# Patient Record
Sex: Female | Born: 1950
Health system: Southern US, Community
[De-identification: ages and names within clinical notes are randomized; demographics above are authoritative.]

## PROBLEM LIST (undated history)

## (undated) DIAGNOSIS — R519 Headache, unspecified: Secondary | ICD-10-CM

## (undated) DIAGNOSIS — M5136 Other intervertebral disc degeneration, lumbar region: Secondary | ICD-10-CM

## (undated) DIAGNOSIS — Z5189 Encounter for other specified aftercare: Secondary | ICD-10-CM

## (undated) DIAGNOSIS — F32A Depression, unspecified: Secondary | ICD-10-CM

## (undated) DIAGNOSIS — K219 Gastro-esophageal reflux disease without esophagitis: Secondary | ICD-10-CM

## (undated) DIAGNOSIS — N184 Chronic kidney disease, stage 4 (severe): Secondary | ICD-10-CM

## (undated) DIAGNOSIS — G4700 Insomnia, unspecified: Secondary | ICD-10-CM

## (undated) DIAGNOSIS — J302 Other seasonal allergic rhinitis: Secondary | ICD-10-CM

## (undated) DIAGNOSIS — J9601 Acute respiratory failure with hypoxia: Secondary | ICD-10-CM

## (undated) DIAGNOSIS — M1990 Unspecified osteoarthritis, unspecified site: Secondary | ICD-10-CM

## (undated) DIAGNOSIS — G8929 Other chronic pain: Secondary | ICD-10-CM

## (undated) DIAGNOSIS — J8410 Pulmonary fibrosis, unspecified: Secondary | ICD-10-CM

## (undated) DIAGNOSIS — G43909 Migraine, unspecified, not intractable, without status migrainosus: Secondary | ICD-10-CM

## (undated) DIAGNOSIS — G2581 Restless legs syndrome: Secondary | ICD-10-CM

## (undated) DIAGNOSIS — M545 Low back pain, unspecified: Secondary | ICD-10-CM

## (undated) DIAGNOSIS — T8859XA Other complications of anesthesia, initial encounter: Secondary | ICD-10-CM

## (undated) DIAGNOSIS — D509 Iron deficiency anemia, unspecified: Secondary | ICD-10-CM

## (undated) DIAGNOSIS — G479 Sleep disorder, unspecified: Secondary | ICD-10-CM

## (undated) DIAGNOSIS — M5459 Other low back pain: Secondary | ICD-10-CM

## (undated) DIAGNOSIS — R7989 Other specified abnormal findings of blood chemistry: Secondary | ICD-10-CM

## (undated) DIAGNOSIS — D649 Anemia, unspecified: Secondary | ICD-10-CM

## (undated) DIAGNOSIS — I700 Atherosclerosis of aorta: Secondary | ICD-10-CM

## (undated) DIAGNOSIS — Q6589 Other specified congenital deformities of hip: Secondary | ICD-10-CM

## (undated) DIAGNOSIS — K449 Diaphragmatic hernia without obstruction or gangrene: Secondary | ICD-10-CM

## (undated) DIAGNOSIS — I209 Angina pectoris, unspecified: Secondary | ICD-10-CM

## (undated) DIAGNOSIS — S329XXA Fracture of unspecified parts of lumbosacral spine and pelvis, initial encounter for closed fracture: Secondary | ICD-10-CM

## (undated) DIAGNOSIS — F419 Anxiety disorder, unspecified: Secondary | ICD-10-CM

## (undated) DIAGNOSIS — S22000A Wedge compression fracture of unspecified thoracic vertebra, initial encounter for closed fracture: Secondary | ICD-10-CM

## (undated) DIAGNOSIS — E538 Deficiency of other specified B group vitamins: Secondary | ICD-10-CM

## (undated) DIAGNOSIS — D631 Anemia in chronic kidney disease: Secondary | ICD-10-CM

## (undated) DIAGNOSIS — R0600 Dyspnea, unspecified: Secondary | ICD-10-CM

## (undated) DIAGNOSIS — M810 Age-related osteoporosis without current pathological fracture: Secondary | ICD-10-CM

## (undated) DIAGNOSIS — I2510 Atherosclerotic heart disease of native coronary artery without angina pectoris: Secondary | ICD-10-CM

## (undated) DIAGNOSIS — R200 Anesthesia of skin: Secondary | ICD-10-CM

## (undated) DIAGNOSIS — M51369 Other intervertebral disc degeneration, lumbar region without mention of lumbar back pain or lower extremity pain: Secondary | ICD-10-CM

## (undated) DIAGNOSIS — E669 Obesity, unspecified: Secondary | ICD-10-CM

## (undated) DIAGNOSIS — E785 Hyperlipidemia, unspecified: Secondary | ICD-10-CM

## (undated) DIAGNOSIS — I011 Acute rheumatic endocarditis: Secondary | ICD-10-CM

## (undated) DIAGNOSIS — I7300 Raynaud's syndrome without gangrene: Secondary | ICD-10-CM

## (undated) DIAGNOSIS — M940 Chondrocostal junction syndrome [Tietze]: Secondary | ICD-10-CM

## (undated) DIAGNOSIS — R202 Paresthesia of skin: Secondary | ICD-10-CM

## (undated) DIAGNOSIS — M069 Rheumatoid arthritis, unspecified: Secondary | ICD-10-CM

## (undated) DIAGNOSIS — J189 Pneumonia, unspecified organism: Secondary | ICD-10-CM

## (undated) DIAGNOSIS — Z96659 Presence of unspecified artificial knee joint: Secondary | ICD-10-CM

## (undated) DIAGNOSIS — N189 Chronic kidney disease, unspecified: Secondary | ICD-10-CM

## (undated) DIAGNOSIS — G4733 Obstructive sleep apnea (adult) (pediatric): Secondary | ICD-10-CM

## (undated) DIAGNOSIS — I509 Heart failure, unspecified: Secondary | ICD-10-CM

## (undated) DIAGNOSIS — M1711 Unilateral primary osteoarthritis, right knee: Secondary | ICD-10-CM

## (undated) DIAGNOSIS — E611 Iron deficiency: Secondary | ICD-10-CM

## (undated) DIAGNOSIS — G4730 Sleep apnea, unspecified: Secondary | ICD-10-CM

## (undated) DIAGNOSIS — I10 Essential (primary) hypertension: Secondary | ICD-10-CM

## (undated) DIAGNOSIS — Z7960 Long term (current) use of unspecified immunomodulators and immunosuppressants: Secondary | ICD-10-CM

## (undated) HISTORY — DX: Unilateral primary osteoarthritis, right knee: M17.11

## (undated) HISTORY — PX: HIP SURGERY: SHX245

## (undated) HISTORY — PX: CHOLECYSTECTOMY: SHX55

## (undated) HISTORY — DX: Heart failure, unspecified: I50.9

## (undated) HISTORY — PX: FRACTURE SURGERY: SHX138

## (undated) HISTORY — DX: Long term (current) use of unspecified immunomodulators and immunosuppressants: Z79.60

## (undated) HISTORY — DX: Acute rheumatic endocarditis: I01.1

## (undated) HISTORY — PX: CARPAL TUNNEL RELEASE: SHX101

## (undated) HISTORY — PX: DILATION AND CURETTAGE OF UTERUS: SHX78

## (undated) HISTORY — DX: Anemia, unspecified: D64.9

## (undated) HISTORY — PX: TUBAL LIGATION: SHX77

## (undated) HISTORY — PX: APPENDECTOMY: SHX54

## (undated) HISTORY — DX: Sleep apnea, unspecified: G47.30

## (undated) HISTORY — DX: Presence of unspecified artificial knee joint: Z96.659

## (undated) HISTORY — PX: ABDOMINAL SURGERY: SHX537

## (undated) NOTE — *Deleted (*Deleted)
[facility]  [address] [place], [place] [address] Phone: [phone]  Fax: [phone]   Clinic Day:  10/23/2020  Referring physician: [name], *  Chief Complaint: Tonya Cummings is a 47 y.o. female with rheumatoid arthritis and iron deficiency anemia who is seen for 3 week assessment.  HPI: The patient was last seen in the hematology clinic on 10/02/2020. At that time, she felt "fine".  She denied any bleeding. Hematocrit was 31.8, hemoglobin 10.1, MCV 96.4, platelets 297,000, WBC 7,700. Ferritin was 70. Sed rate was 47. She continued oral B12 500 mcg.  She received Venofer.  The patient saw Stephens November, NP on 10/06/2020. She was doing well.  She was taking omeprazole.  She denied any abdominal pain, dyspepsia, nausea or vomiting.  Recommendation was VCE.  The patient wished to postpone until after her knee surgery.  She underwent computer assisted total right knee replacement on 10/13/2020 by Dr. Marry Guan.  Estimated blood loss was 50 ml.  During the interim, ***   Past Medical History:  Diagnosis Date  . Anxiety   . Chronic kidney disease   . Chronic pain   . Chronic radicular pain of lower back   . Costochondritis   . Depression   . Encounter for blood transfusion   . GERD (gastroesophageal reflux disease)   . Headache    migraines  . Hip dysplasia   . Hyperlipidemia   . Hypertension   . Iron deficiency anemia   . Low back pain   . Low vitamin B12 level   . Obesity   . Osteoporosis   . Pelvic fracture ([place])   . Raynaud's disease without gangrene   . Restless leg syndrome   . Rheumatoid aortitis   . Rheumatoid arthritis (HCC)    polyarthritis  . Rheumatoid arthritis ([place])   . Seasonal allergies   . Sleep disorder   . Thoracic compression fracture [facility])     Past Surgical History:  Procedure Laterality Date  . ABDOMINAL SURGERY     pt denies  . APPENDECTOMY    . CARPAL TUNNEL RELEASE Bilateral    . CHOLECYSTECTOMY    . COLONOSCOPY WITH PROPOFOL N/A 08/15/2020   Procedure: COLONOSCOPY WITH PROPOFOL;  Surgeon: Robert Bellow, MD;  Location: [facility] ENDOSCOPY;  Service: Endoscopy;  Laterality: N/A;  . DILATION AND CURETTAGE OF UTERUS    . ESOPHAGOGASTRODUODENOSCOPY (EGD) WITH PROPOFOL N/A 08/15/2020   Procedure: ESOPHAGOGASTRODUODENOSCOPY (EGD) WITH PROPOFOL;  Surgeon: Robert Bellow, MD;  Location: [facility] ENDOSCOPY;  Service: Endoscopy;  Laterality: N/A;  . FRACTURE SURGERY     hip fracture   . FRACTURE SURGERY     pelvic fracture plate   . HIP SURGERY Left   . KNEE ARTHROPLASTY Right 10/13/2020   Procedure: COMPUTER ASSISTED TOTAL KNEE ARTHROPLASTY - RNFA;  Surgeon: Dereck Leep, MD;  Location: [facility] ORS;  Service: Orthopedics;  Laterality: Right;  . TUBAL LIGATION      Family History  Problem Relation Age of Onset  . Diabetes Mother   . Hypertension Mother   . Aneurysm Father   . Diabetes Son   . Seizures Son   . Osteosarcoma Brother   . Cancer Brother   . Diabetes Brother   . Diabetes Maternal Grandfather   . Heart disease Maternal Grandfather     Social History:  reports that she has never smoked. She has never used smokeless tobacco. She reports that she does not drink alcohol and  does not use drugs. She has no known exposure to chemicals or radiation.Her husband's name is Herbie Baltimore. They have 3 children. She has been disabled since 2015. She previously worked in [employer]. She lives in [place] her husband, son, and eldest daughter. The patient is alone*** today.   Allergies:  Allergies  Allergen Reactions  . Gabapentin Other (See Comments)    Weight gain  . Ibuprofen Other (See Comments)    Headache    Current Medications: Current Outpatient Medications  Medication Sig Dispense Refill  . Acetaminophen-Caffeine (EXCEDRIN TENSION HEADACHE) 500-65 MG TABS Take 1 tablet by mouth daily as needed (Headache).    . Adalimumab (HUMIRA) 40 MG/0.4ML PSKT  Inject 40 mg into the skin every 14 (fourteen) days.     . ASCORBIC ACID PO Take 1 tablet by mouth daily.     . Calcium Carbonate-Vitamin D 600-400 MG-UNIT chew tablet Chew 1 tablet by mouth 2 (two) times daily.     . celecoxib (CELEBREX) 200 MG capsule Take 1 capsule (200 mg total) by mouth 2 (two) times daily. 60 capsule 1  . Cholecalciferol 50 MCG (2000 UT) CAPS Take 2,000 Units by mouth daily.    . cyclobenzaprine (FLEXERIL) 10 MG tablet Take 10 mg by mouth at bedtime.    . enalapril-hydrochlorothiazide (VASERETIC) 10-25 MG per tablet Take 1 tablet by mouth daily.    Marland Kitchen enoxaparin (LOVENOX) 40 MG/0.4ML injection Inject 0.4 mLs (40 mg total) into the skin daily for 14 days. 5.6 mL 0  . FLUoxetine (PROZAC) 40 MG capsule Take 40 mg by mouth daily.    . folic acid (FOLVITE) 1 MG tablet Take 1 mg by mouth daily.    . hydroxychloroquine (PLAQUENIL) 200 MG tablet Take 200 mg by mouth 2 (two) times daily.    . methotrexate (50 MG/ML) 1 g injection Inject 25 mg into the vein once a week. .6 ml    . Multiple Vitamins-Minerals (MULTIVITAMIN ADULT PO) Take 1 tablet by mouth daily.     Marland Kitchen omeprazole (PRILOSEC) 40 MG capsule Take 40 mg by mouth 2 (two) times daily.     Marland Kitchen oxyCODONE (OXY IR/ROXICODONE) 5 MG immediate release tablet Take 1-2 tablets (5-10 mg total) by mouth every 4 (four) hours as needed for moderate pain (pain score 4-6). 30 tablet 0  . pramipexole (MIRAPEX) 0.125 MG tablet Take 0.125 mg by mouth daily. Take 0.125 mg in the morning and 2 at bedtime    . rosuvastatin (CRESTOR) 5 MG tablet Take 5 mg by mouth every other day.     . traMADol (ULTRAM) 50 MG tablet Take 1-2 tablets (50-100 mg total) by mouth every 4 (four) hours as needed for moderate pain. 30 tablet 0  . traZODone (DESYREL) 50 MG tablet Take 50 mg by mouth at bedtime.      No current facility-administered medications for this visit.   Review of Systems  Constitutional: Negative for chills, diaphoresis, fever, malaise/fatigue and  weight loss (up 1 lb).       Feels "good."  HENT: Negative.  Negative for congestion, ear discharge, ear pain, hearing loss, nosebleeds, sinus pain, sore throat and tinnitus.   Eyes: Positive for photophobia. Negative for blurred vision and double vision.  Respiratory: Positive for shortness of breath (with exertion). Negative for cough, hemoptysis and sputum production.   Cardiovascular: Negative.  Negative for chest pain, palpitations and orthopnea.  Gastrointestinal: Negative for abdominal pain, blood in stool, constipation, diarrhea, heartburn (on omeprazole), melena, nausea and vomiting.  Does not eat iron-rich foods.  Genitourinary: Negative.  Negative for dysuria, flank pain, frequency, hematuria and urgency.  Musculoskeletal: Positive for joint pain (right knee, torn miniscus). Negative for back pain, falls, myalgias and neck pain.  Skin: Negative.  Negative for itching and rash.  Neurological: Positive for headaches. Negative for dizziness, tingling, tremors, sensory change, speech change, focal weakness, seizures and weakness.  Endo/Heme/Allergies: Negative.  Does not bruise/bleed easily.  Psychiatric/Behavioral: Negative for depression and memory loss. The patient has insomnia (wakes up 2-3 times). The patient is not nervous/anxious.   All other systems reviewed and are negative.  Performance status (ECOG):  1***  Vitals There were no vitals taken for this visit.  Physical Exam Vitals and nursing note reviewed.  Constitutional:      General: She is not in acute distress.    Appearance: She is well-developed. She is not diaphoretic.     Interventions: Face mask in place.     Comments: Patient required assistance onto exam table. She has a rolling walker by her side.  HENT:     Head: Normocephalic and atraumatic.     Comments: Long brown hair.    Mouth/Throat:     Mouth: Mucous membranes are moist.     Pharynx: Oropharynx is clear. No oropharyngeal exudate.  Eyes:      General: No scleral icterus.    Extraocular Movements: Extraocular movements intact.     Conjunctiva/sclera: Conjunctivae normal.     Pupils: Pupils are equal, round, and reactive to light.     Comments: Glasses.  Blue eyes.  Neck:     Vascular: No JVD.  Cardiovascular:     Rate and Rhythm: Normal rate and regular rhythm.     Heart sounds: Normal heart sounds. No murmur heard.  No gallop.   Pulmonary:     Effort: Pulmonary effort is normal. No respiratory distress.     Breath sounds: Normal breath sounds. No wheezing or rales.  Chest:     Chest wall: No tenderness.  Abdominal:     General: Bowel sounds are normal. There is no distension.     Palpations: Abdomen is soft. There is no mass.     Tenderness: There is no abdominal tenderness. There is no guarding or rebound.  Musculoskeletal:        General: No tenderness. Normal range of motion.     Cervical back: Normal range of motion and neck supple.  Lymphadenopathy:     Head:     Right side of head: No preauricular, posterior auricular or occipital adenopathy.     Left side of head: No preauricular, posterior auricular or occipital adenopathy.     Cervical: No cervical adenopathy.     Upper Body:     Right upper body: No supraclavicular adenopathy.     Left upper body: No supraclavicular adenopathy.     Lower Body: No right inguinal adenopathy. No left inguinal adenopathy.  Skin:    General: Skin is warm and dry.     Coloration: Skin is not pale.     Findings: No erythema or rash.  Neurological:     Mental Status: She is alert and oriented to person, place, and time.  Psychiatric:        Behavior: Behavior normal.        Thought Content: Thought content normal.    No visits with results within 3 Day(s) from this visit.  Latest known visit with results is:  Admission on 10/13/2020, Discharged on  10/15/2020  Component Date Value Ref Range Status  . ABO/RH(D) 10/13/2020    Final                   Value:O POS Performed at  [facility], [address] [place],  [address]     Assessment:  Tonya Cummings is a 34 y.o. female withseropositive rheumatoid arthritisand a progressive normocytic anemia. She was initially diagnosed in 2000. She is currently on methotrexate SQ oncea week, Plaquenil 200 mg twice daily and Humira 40 mgeveryother week (since 05/2016). She previously received Morrie Sheldon (2014/2015-02/2016) and Enbrel 25 mg biweekly. Shetakes folic acid1 mg a day.   Work-up on 09/07/2019 revealed a hematocrit of 26.2, hemoglobin 8.0, MCV 82.4, platelets 351,000, white count 7900 with an ANC of 5000.  Ferritin was 10 with an iron saturation of 6% and a TIBC of 428.  CRP and sed rate were levated.  B12 was 339 (low normal).  Normal studies included:  folate (> 100), TSH, and haptoglobin.  LDH was 209.  Retic was 1.9%.  She has iron deficiency.  She received Venofer weekly x 3 (10/03/2019 - 10/16/2019), x 2 (05/02/2020 - 05/07/2020), and 07/01/2020.  Ferritin has been followed:  10 on 09/07/2019, 16 on 09/26/2019, 19 on 10/03/2019, 191 on 10/23/2019, 40 on 12/24/2019, and 14 on 02/26/2020.  Colonoscopy on 08/15/2020 revealed one 5 mm polyp in the proximal ascending colon (tubular adenoma). There was diverticulosis in the sigmoid colon. EGD was normal.  She has B12 deficiency.  B12 was 339 on 09/07/2019.  She is on oral B12 500 mcg a day.  B12 was 604 on 10/23/2019.  Folate was > 100 on 09/07/2019.  She has stage III chronic kidney disease.Creatinine is 1.3 (CrCl 41 ml/min).  She underwent computer assisted total right knee replacement on 10/13/2020  Symptomatically, ***   Plan: 1.   Labs today: CBC, ferritin, iron studies, sed rate, B12, folate   2. Iron deficiency anemia           Symptomatically, she feels a fatigue associated with anemia.    Hematocrit 30.7.  Hemoglobin 9.5.  MCV 89.8 on 02/26/2020.              Ferritin 14 (low) with an iron saturation of 4% and  TIBC of 378.  Hematocrit 32.1. hemoglobin 10.1.  MCV 93.9 on 06/30/2020.   Ferritin 104 with an iron saturation of 16% and a TIBC of 270.  Hematocrit 31.8.  Hemoglobin 10.1.  MCV 96.4 on 10/01/2020.   Ferritin 70.  Sed rate 47.  Ferritin goal 100.  Patient last received IV iron on 05/07/2020.  She denies any bleeding.  Review interval colonoscopy and EGD from 08/15/2020.    No evidence of bleeding.  Discuss Venofer for today x1 secondary to upcoming surgery and likely blood loss. 3.   B12 deficiency             B12 was 339 on 09/07/2019 and 604 on 10/23/2019.             B12 goal is 400.             She remains on B12 500 mcg a day.             Check B12 and folate annually. 4.   Venofer today. 5.   RTC on 11/15 for MD assessment, labs (CBC, ferritin, iron studies, sed rate, B12, folate), and +/- Venofer.  I discussed the assessment and treatment plan  with the patient.  The patient was provided an opportunity to ask questions and all were answered.  The patient agreed with the plan and demonstrated an understanding of the instructions.  The patient was advised to call back if the symptoms worsen or if the condition fails to improve as anticipated.  I provided *** minutes of face-to-face time during this this encounter and > 50% was spent counseling as documented under my assessment and plan.  Lequita Asal, MD, PhD    10/23/2020, 2:56 PM  I, Mirian Mo Tufford, am acting as a Education administrator for [employer]. Mike Gip, MD.   I, Delman Goshorn C. Mike Gip, MD, have reviewed the above documentation for accuracy and completeness, and I agree with the above.

---

## 2012-10-28 ENCOUNTER — Ambulatory Visit: Payer: Self-pay | Admitting: Internal Medicine

## 2013-11-20 ENCOUNTER — Ambulatory Visit: Payer: Self-pay | Admitting: Family Medicine

## 2014-07-13 ENCOUNTER — Ambulatory Visit: Payer: Self-pay | Admitting: Family Medicine

## 2014-09-18 DIAGNOSIS — M5459 Other low back pain: Secondary | ICD-10-CM | POA: Insufficient documentation

## 2015-05-28 ENCOUNTER — Encounter: Payer: Self-pay | Admitting: Emergency Medicine

## 2015-05-28 ENCOUNTER — Ambulatory Visit: Payer: No Typology Code available for payment source

## 2015-05-28 ENCOUNTER — Ambulatory Visit
Admission: EM | Admit: 2015-05-28 | Discharge: 2015-05-28 | Disposition: A | Discharge status: Home / Self Care | Payer: No Typology Code available for payment source | Attending: Family Medicine | Admitting: Family Medicine

## 2015-05-28 DIAGNOSIS — Z79899 Other long term (current) drug therapy: Secondary | ICD-10-CM | POA: Insufficient documentation

## 2015-05-28 DIAGNOSIS — I10 Essential (primary) hypertension: Secondary | ICD-10-CM | POA: Diagnosis not present

## 2015-05-28 DIAGNOSIS — S7290XA Unspecified fracture of unspecified femur, initial encounter for closed fracture: Secondary | ICD-10-CM

## 2015-05-28 DIAGNOSIS — M810 Age-related osteoporosis without current pathological fracture: Secondary | ICD-10-CM | POA: Diagnosis not present

## 2015-05-28 DIAGNOSIS — S7292XA Unspecified fracture of left femur, initial encounter for closed fracture: Secondary | ICD-10-CM | POA: Insufficient documentation

## 2015-05-28 DIAGNOSIS — S8002XA Contusion of left knee, initial encounter: Secondary | ICD-10-CM | POA: Insufficient documentation

## 2015-05-28 DIAGNOSIS — M069 Rheumatoid arthritis, unspecified: Secondary | ICD-10-CM | POA: Insufficient documentation

## 2015-05-28 DIAGNOSIS — M25562 Pain in left knee: Secondary | ICD-10-CM | POA: Diagnosis present

## 2015-05-28 DIAGNOSIS — W010XXA Fall on same level from slipping, tripping and stumbling without subsequent striking against object, initial encounter: Secondary | ICD-10-CM | POA: Diagnosis not present

## 2015-05-28 HISTORY — DX: Age-related osteoporosis without current pathological fracture: M81.0

## 2015-05-28 HISTORY — DX: Essential (primary) hypertension: I10

## 2015-05-28 HISTORY — DX: Rheumatoid arthritis, unspecified: M06.9

## 2015-05-28 MED ORDER — KETOROLAC TROMETHAMINE 60 MG/2ML IM SOLN
60.0000 mg | Freq: Once | INTRAMUSCULAR | Status: AC
  Administered 2015-05-28: 60 mg via INTRAMUSCULAR

## 2015-05-28 MED ORDER — KETOROLAC TROMETHAMINE 60 MG/2ML IM SOLN: 60.0000 mg | Freq: Once | INTRAMUSCULAR | Status: DC

## 2015-05-28 NOTE — Discharge Instructions (Signed)
Contusion °A contusion is a deep bruise. Contusions are the result of an injury that caused bleeding under the skin. The contusion may turn blue, purple, or yellow. Minor injuries will give you a painless contusion, but more severe contusions may stay painful and swollen for a few weeks.  °CAUSES  °A contusion is usually caused by a blow, trauma, or direct force to an area of the body. °SYMPTOMS  °· Swelling and redness of the injured area. °· Bruising of the injured area. °· Tenderness and soreness of the injured area. °· Pain. °DIAGNOSIS  °The diagnosis can be made by taking a history and physical exam. An X-ray, CT scan, or MRI may be needed to determine if there were any associated injuries, such as fractures. °TREATMENT  °Specific treatment will depend on what area of the body was injured. In general, the best treatment for a contusion is resting, icing, elevating, and applying cold compresses to the injured area. Over-the-counter medicines may also be recommended for pain control. Ask your caregiver what the best treatment is for your contusion. °HOME CARE INSTRUCTIONS  °· Put ice on the injured area. °¨ Put ice in a plastic bag. °¨ Place a towel between your skin and the bag. °¨ Leave the ice on for 15-20 minutes, 3-4 times a day, or as directed by your health care provider. °· Only take over-the-counter or prescription medicines for pain, discomfort, or fever as directed by your caregiver. Your caregiver may recommend avoiding anti-inflammatory medicines (aspirin, ibuprofen, and naproxen) for 48 hours because these medicines may increase bruising. °· Rest the injured area. °· If possible, elevate the injured area to reduce swelling. °SEEK IMMEDIATE MEDICAL CARE IF:  °· You have increased bruising or swelling. °· You have pain that is getting worse. °· Your swelling or pain is not relieved with medicines. °MAKE SURE YOU:  °· Understand these instructions. °· Will watch your condition. °· Will get help right  away if you are not doing well or get worse. °Document Released: 09/08/2005 Document Revised: 12/04/2013 Document Reviewed: 10/04/2011 °[employer]® Patient Information ©2015 [employer]. This information is not intended to replace advice given to you by your health care provider. Make sure you discuss any questions you have with your health care provider. ° °

## 2015-05-28 NOTE — ED Notes (Signed)
Patient states that he fell and hit her left knee.  Patient c/o pain in her left knee.

## 2015-05-28 NOTE — ED Provider Notes (Signed)
CSN: [id number]     Arrival date & time 05/28/15  1315 History   First MD Initiated Contact with Patient 05/28/15 1342     Chief Complaint  Patient presents with  . Knee Pain    left  . Fall   (Consider location/radiation/quality/duration/timing/severity/associated sxs/prior Treatment) HPI    This 64 year old female who presents with left anterior knee pain after she tripped over a dog's bed and fell directly onto her anterior knee. At first she states she was unable to stand or walk but after initial pain subsided she has been able to ambulate for brief. With the aid of a single cane. They immediately placed ice on the knee. This morning she noticed more swelling and the pain persists so she decided to come in for treatment. She indicates that most of her pain is anteriorly particularly the inferior patella and around the tibial plateau area. She denies any locking or popping. She did not have any syncope and remembers that it was the dog bed that tripped her  Past Medical History  Diagnosis Date  . Rheumatoid arthritis   . Hypertension   . Osteoporosis    Past Surgical History  Procedure Laterality Date  . Hip surgery Left   . Cholecystectomy    . Tubal ligation    . Carpal tunnel release Bilateral    History reviewed. No pertinent family history. History  Substance Use Topics  . Smoking status: Never Smoker   . Smokeless tobacco: Never Used  . Alcohol Use: No   OB History    No data available     Review of Systems  All other systems reviewed and are negative.   Allergies  Ibuprofen  Home Medications   Prior to Admission medications   Medication Sig Start Date End Date Taking? Authorizing Provider  cyclobenzaprine (FLEXERIL) 10 MG tablet Take 10 mg by mouth at bedtime.   Yes Historical Provider, MD  enalapril-hydrochlorothiazide (VASERETIC) 10-25 MG per tablet Take 1 tablet by mouth daily.   Yes Historical Provider, MD  FLUoxetine (PROZAC) 40 MG capsule Take 40 mg  by mouth daily.   Yes Historical Provider, MD  folic acid (FOLVITE) 1 MG tablet Take 1 mg by mouth daily.   Yes Historical Provider, MD  hydroxychloroquine (PLAQUENIL) 200 MG tablet Take 200 mg by mouth 2 (two) times daily.   Yes Historical Provider, MD  methotrexate (RHEUMATREX) 2.5 MG tablet Take 20 mg by mouth once a week. Caution:Chemotherapy. Protect from light.   Yes Historical Provider, MD  omeprazole (PRILOSEC) 20 MG capsule Take 20 mg by mouth daily.   Yes Historical Provider, MD  Tofacitinib Citrate (XELJANZ) 5 MG TABS Take 1 tablet by mouth 2 (two) times daily.   Yes Historical Provider, MD   BP 147/82 mmHg  Pulse 85  Temp(Src) 98 F (36.7 C) (Oral)  Resp 16  Ht 5\' 2"  (1.575 m)  Wt 205 lb (92.987 kg)  BMI 37.49 kg/m2  SpO2 98% Physical Exam  Constitutional: She is oriented to person, place, and time. She appears well-developed and well-nourished.  HENT:  Head: Normocephalic and atraumatic.  Eyes: EOM are normal. Pupils are equal, round, and reactive to light.  Neck: Normal range of motion. Neck supple.  Musculoskeletal:  Examination of the left knee was performed with Misty as a chaperone. There is ecchymosis of the left knee mostly at the lateral inferior portion of the patellar pole with extravasation into the proximal tibia. There is an effusion present to 2+. There  is significant tenderness to manipulation of the patella and the patient resists retropatellar examination. The knee is comfortable through a limited range of extension to 90 of flexion. There is no induration crepitus present. There is tenderness to palpation of the distal tibia mostly medially but laterally as well. Ligaments are intact of the lateral collateral medial collateral anterior cruciate and  posterior cruciate. In addition the patient had some tenderness to palpation along the mid femur but without significant hematoma erythema or induration. Hip range of motion was uncomfortable but mostly laterally  over a 3 was the injected greater trochanter which was recently injected for trochanteric bursitis per the patient. The patient is able to ambulate with antalgic gait utilizing a single cane.  Neurological: She is alert and oriented to person, place, and time. She has normal reflexes.  Skin: Skin is warm and dry.  Psychiatric: She has a normal mood and affect. Her behavior is normal. Judgment and thought content normal.    ED Course  Procedures (including critical care time) Labs Review Labs Reviewed - No data to display  Imaging Review Dg Knee Complete 4 Views Left  05/28/2015   CLINICAL DATA:  Patient tripped and fell, injuring knee. Swelling and bruising primarily laterally  EXAM: LEFT KNEE - COMPLETE 4+ VIEW  COMPARISON:  None.  FINDINGS: Upright frontal, upright tunnel, upright lateral, and sunrise patellar images were obtained. There is no fracture or dislocation. There is a rather minimal knee joint effusion. There is slight spurring in all compartments. There is mild narrowing of the patellofemoral joint. There are foci of intrameniscal calcification. No erosive change.  IMPRESSION: Areas of relatively mild osteoarthritic change. Minimal joint effusion. Intra-articular chondrocalcinosis is noted. This finding may be seen with osteoarthritis but also could indicate a degree of calcium pyrophosphate deposition disease which may present clinically as pseudogout. No fracture or dislocation.   Electronically Signed   By: Lowella Grip III M.D.   On: 05/28/2015 14:41   Dg Femur Min 2 Views Left  05/28/2015   CLINICAL DATA:  Pain following fall  EXAM: LEFT FEMUR 2 VIEWS  COMPARISON:  None.  FINDINGS: Frontal and lateral views were obtained. There is evidence of old trauma with screw and plate fixation in the pubic symphysis region. There is a screw also noted in the inferior left iliac crest as well as incomplete visualization of a screw transfixing sacrum. There is no acute fracture or  dislocation. There is slight knee and hip joint osteoarthritic change. No erosive change.  IMPRESSION: No acute fracture or dislocation. Old trauma with areas of screw and plate fixation. Slight hip and knee joint narrowing.   Electronically Signed   By: Lowella Grip III M.D.   On: 05/28/2015 14:43   Medications  ketorolac (TORADOL) injection 60 mg (60 mg Intramuscular Given 05/28/15 1401)    MDM   1. Contusion, knee, left, initial encounter   2. Fracture, femur    There is no evidence of a fractured femur as listed above and cannot be removed despite numerous attempts by the undersigned New Prescriptions   No medications on file   I discussed the findings and the x-ray results with the patient and her husband. Appears to be no fractures or dislocations. Does have some arthritic osteoarthritis and a small effusion. I recommended conservative care which will include icing for 20 minutes out of 2 hours ,use of a cane in the opposite hand and elevation as necessary to minimize swelling. She continues to have  discomfort should follow-up with her primary MD, orthopedist or may return here anytime if necessary. She is on multiple RA medicines which should help with the pain  But can augment this with Tylenol as necessary.     Lorin Picket, PA-C 05/28/15 1500

## 2016-02-23 DIAGNOSIS — Q6589 Other specified congenital deformities of hip: Secondary | ICD-10-CM | POA: Insufficient documentation

## 2016-02-23 DIAGNOSIS — I7300 Raynaud's syndrome without gangrene: Secondary | ICD-10-CM | POA: Insufficient documentation

## 2016-02-23 DIAGNOSIS — M810 Age-related osteoporosis without current pathological fracture: Secondary | ICD-10-CM | POA: Insufficient documentation

## 2016-02-23 DIAGNOSIS — M059 Rheumatoid arthritis with rheumatoid factor, unspecified: Secondary | ICD-10-CM | POA: Insufficient documentation

## 2016-11-12 ENCOUNTER — Ambulatory Visit
Admission: EM | Admit: 2016-11-12 | Discharge: 2016-11-12 | Disposition: A | Discharge status: Home / Self Care | Payer: BLUE CROSS/BLUE SHIELD | Attending: Family Medicine | Admitting: Family Medicine

## 2016-11-12 DIAGNOSIS — H109 Unspecified conjunctivitis: Secondary | ICD-10-CM | POA: Diagnosis not present

## 2016-11-12 DIAGNOSIS — B9689 Other specified bacterial agents as the cause of diseases classified elsewhere: Secondary | ICD-10-CM

## 2016-11-12 DIAGNOSIS — J0100 Acute maxillary sinusitis, unspecified: Secondary | ICD-10-CM

## 2016-11-12 MED ORDER — ERYTHROMYCIN 5 MG/GM OP OINT: 1.0000 "application " | TOPICAL_OINTMENT | Freq: Four times a day (QID) | OPHTHALMIC | 0 refills | Status: DC

## 2016-11-12 MED ORDER — AMOXICILLIN-POT CLAVULANATE 875-125 MG PO TABS: 1.0000 | ORAL_TABLET | Freq: Two times a day (BID) | ORAL | 0 refills | Status: DC

## 2016-11-12 NOTE — ED Triage Notes (Signed)
Pt states that her eyes are red and swollen, itch and drain and it has been going on for a while it comes and goes. By night time she cant see very well because they are so swollen.

## 2016-11-12 NOTE — Discharge Instructions (Signed)
Take medication as prescribed. Rest. Drink plenty of fluids. Avoid rubbing eyes. Use good hand hygiene.   Follow up with your primary care physician or ophthalmologist this week as needed. Return to Urgent care for new or worsening concerns.

## 2016-11-12 NOTE — ED Provider Notes (Signed)
[facility] ____________________________________________  Time seen: Approximately 10:50 AM  I have reviewed the triage vital signs and the nursing notes.   HISTORY  Chief Complaint Eye Problem  HPI Tonya Cummings is a 65 y.o. female presents with a complaint of runny nose, nasal congestion and bilateral eye redness and drainage. Patient reports approximately 2 weeks ago she began to have what she thought was a cold, and reports she didn't have bilateral eye irritation with nasal congestion. Patient states at that time she does not have any drainage or discharge, but reports her eyes were somewhat itchy. Patient states that she was rubbing her eyes. Patient reports that the eye complaints did improve for a few days but then returned with bilateral eye redness, itching and greenish drainage for the last few days. Patient reports matting present upon awakening and she has to clean her eyes to open. Reports intermittent discharge throughout the day leading to slight blurry vision. Denies any blurry vision in absence of discharge present. States continues to rub her eyes because they itch. Denies any foreign bodies, chemical exposure, pinkeye exposure other changes. Wears glasses, no contact use.   Patient reports that she continues with nasal congestion and postnasal drainage. Reports sinus pressure around her cheeks which are tender to touch. Denies any redness, swelling, headache, dizziness, vision changes or hearing changes. Denies photophobia or vision changes. Denies recent sickness or recent antibiotic use. Denies renal insufficiency.  [name], NP: PCP Opthalmology : [facility]    Past Medical History:  Diagnosis Date  . Hypertension   . Osteoporosis   . Rheumatoid arthritis ([place])     There are no active problems to display for this patient.   Past Surgical History:  Procedure Laterality Date  . CARPAL TUNNEL RELEASE Bilateral   . CHOLECYSTECTOMY      . HIP SURGERY Left   . TUBAL LIGATION      No current facility-administered medications for this encounter.   Current Outpatient Prescriptions:  .  Adalimumab (HUMIRA) 40 MG/0.8ML PSKT, Inject into the skin., Disp: , Rfl:  .  cyclobenzaprine (FLEXERIL) 10 MG tablet, Take 10 mg by mouth at bedtime., Disp: , Rfl:  .  enalapril-hydrochlorothiazide (VASERETIC) 10-25 MG per tablet, Take 1 tablet by mouth daily., Disp: , Rfl:  .  FLUoxetine (PROZAC) 40 MG capsule, Take 40 mg by mouth daily., Disp: , Rfl:  .  folic acid (FOLVITE) 1 MG tablet, Take 1 mg by mouth daily., Disp: , Rfl:  .  hydroxychloroquine (PLAQUENIL) 200 MG tablet, Take 200 mg by mouth 2 (two) times daily., Disp: , Rfl:  .  methotrexate (50 MG/ML) 1 g injection, Inject 25 mg into the vein once., Disp: , Rfl:  .  omeprazole (PRILOSEC) 20 MG capsule, Take 20 mg by mouth daily., Disp: , Rfl:  .  amoxicillin-clavulanate (AUGMENTIN) 875-125 MG tablet, Take 1 tablet by mouth every 12 (twelve) hours., Disp: 20 tablet, Rfl: 0 .  erythromycin ophthalmic ointment, Place 1 application into both eyes 4 (four) times daily. For seven days, Disp: 3.5 g, Rfl: 0  Allergies Ibuprofen  History reviewed. No pertinent family history.  Social History Social History  Substance Use Topics  . Smoking status: Never Smoker  . Smokeless tobacco: Never Used  . Alcohol use No    Review of Systems Constitutional: No fever/chills Eyes: No visual changes.As above. ENT: No sore throat. Cardiovascular: Denies chest pain. Respiratory: Denies shortness of breath. Gastrointestinal: No abdominal pain.  No  nausea, no vomiting.  No diarrhea.  No constipation. Genitourinary: Negative for dysuria. Musculoskeletal: Negative for back pain. Skin: Negative for rash. Neurological: Negative for headaches, focal weakness or numbness.  10-point ROS otherwise negative.  ____________________________________________   PHYSICAL EXAM:  VITAL SIGNS: ED Triage  Vitals  Enc Vitals Group     BP 11/12/16 1003 (!) 168/79     Pulse Rate 11/12/16 1003 75     Resp 11/12/16 1003 18     Temp 11/12/16 1003 98.1 F (36.7 C)     Temp Source 11/12/16 1003 Oral     SpO2 11/12/16 1003 100 %     Weight 11/12/16 1004 210 lb (95.3 kg)     Height 11/12/16 1004 5\' 3"  (1.6 m)     Head Circumference --      Peak Flow --      Pain Score 11/12/16 1007 10     Pain Loc --      Pain Edu? --      Excl. in GC? --     Visual Acuity  Right Eye Distance: 20/50 Left Eye Distance: 20/50 Bilateral Distance: 20/40    Constitutional: Alert and oriented. Well appearing and in no acute distress. Eyes: Bilateral conjunctivae with mild injection and mild amount of greenish active drainage, greenish crusting at eyelash margins to the right eye, minimal immediate surrounding erythema along eyelash margins, no other surrounding erythema,no foreign bodies visualized bilaterally, nontender bilaterally, no periorbital tenderness. PERRL. EOMI. no pain with EOMs. Head: Atraumatic.Mild to moderate tenderness to palpation bilateral maxillary sinuses; mild bilateral frontal sinus tenderness to palpation. No swelling. No erythema.   Ears: no erythema, normal TMs bilaterally.   Nose: nasal congestion with bilateral nasal turbinate erythema and edema.   Mouth/Throat: Mucous membranes are moist.  Oropharynx non-erythematous.No tonsillar swelling or exudate.  Neck: No stridor.  No cervical spine tenderness to palpation. Hematological/Lymphatic/Immunilogical: No cervical lymphadenopathy. Cardiovascular: Normal rate, regular rhythm. Grossly normal heart sounds.  Good peripheral circulation. Respiratory: Normal respiratory effort.  No retractions. Lungs CTAB. No wheezes, rales or rhonchi. Good air movement.  Gastrointestinal: Soft and nontender. No distention. No CVA tenderness. Musculoskeletal: No lower or upper extremity tenderness nor edema. No cervical, thoracic or lumbar tenderness to  palpation.  Neurologic:  Normal speech and language. No gross focal neurologic deficits are appreciated. No gait instability. Skin:  Skin is warm, dry and intact. No rash noted. Psychiatric: Mood and affect are normal. Speech and behavior are normal.  ___________________________________________   LABS (all labs ordered are listed, but only abnormal results are displayed)  Labs Reviewed - No data to display   PROCEDURES Procedures   INITIAL IMPRESSION / ASSESSMENT AND PLAN / ED COURSE  Pertinent labs & imaging results that were available during my care of the patient were reviewed by me and considered in my medical decision making (see chart for details).  . No acute distress. Suspect maxillary sinusitis and bilateral bacterial conjunctivitis. Discussed supportive treatment, good hand hygiene and avoidance of rubbing eyes. Will treat patient with oral Augmentin and erythromycin ophthalmic ointment. Encouraged PCP and ophthalmology follow-up as needed for continued complaints.  Discussed follow up with Primary care physician this week. Discussed follow up and return parameters including no resolution or any worsening concerns. Patient verbalized understanding and agreed to plan.   ____________________________________________   FINAL CLINICAL IMPRESSION(S) / ED DIAGNOSES  Final diagnoses:  Bacterial conjunctivitis of both eyes  Acute maxillary sinusitis, recurrence not specified     Discharge Medication  List as of 11/12/2016 10:36 AM    START taking these medications   Details  amoxicillin-clavulanate (AUGMENTIN) 875-125 MG tablet Take 1 tablet by mouth every 12 (twelve) hours., Starting Fri 11/12/2016, Normal    erythromycin ophthalmic ointment Place 1 application into both eyes 4 (four) times daily. For seven days, Starting Fri 11/12/2016, Normal        Note: This dictation was prepared with Dragon dictation along with smaller phrase technology. Any transcriptional errors  that result from this process are unintentional.    Clinical Course       Marylene Land, NP 11/12/16 1103

## 2018-02-06 DIAGNOSIS — R202 Paresthesia of skin: Secondary | ICD-10-CM | POA: Insufficient documentation

## 2018-02-06 DIAGNOSIS — R200 Anesthesia of skin: Secondary | ICD-10-CM | POA: Insufficient documentation

## 2018-02-06 DIAGNOSIS — G8929 Other chronic pain: Secondary | ICD-10-CM | POA: Insufficient documentation

## 2018-02-07 ENCOUNTER — Other Ambulatory Visit: Payer: Self-pay | Admitting: Internal Medicine

## 2018-02-07 DIAGNOSIS — M5416 Radiculopathy, lumbar region: Secondary | ICD-10-CM

## 2018-02-15 ENCOUNTER — Ambulatory Visit: Payer: Medicare PPO

## 2018-02-17 ENCOUNTER — Inpatient Hospital Stay
Admission: EM | Admit: 2018-02-17 | Discharge: 2018-02-20 | DRG: 189 | Disposition: A | Discharge status: Home / Self Care | Payer: Medicare PPO | Attending: Internal Medicine | Admitting: Internal Medicine

## 2018-02-17 ENCOUNTER — Ambulatory Visit (INDEPENDENT_AMBULATORY_CARE_PROVIDER_SITE_OTHER)
Admission: EM | Admit: 2018-02-17 | Discharge: 2018-02-17 | Disposition: A | Discharge status: Other Acute Inpatient Hospital | Payer: Medicare PPO | Source: Home / Self Care | Attending: Family Medicine | Admitting: Family Medicine

## 2018-02-17 ENCOUNTER — Ambulatory Visit (INDEPENDENT_AMBULATORY_CARE_PROVIDER_SITE_OTHER): Payer: Medicare PPO

## 2018-02-17 ENCOUNTER — Other Ambulatory Visit: Payer: Self-pay

## 2018-02-17 ENCOUNTER — Encounter: Payer: Self-pay | Admitting: Emergency Medicine

## 2018-02-17 DIAGNOSIS — D899 Disorder involving the immune mechanism, unspecified: Secondary | ICD-10-CM

## 2018-02-17 DIAGNOSIS — R0602 Shortness of breath: Secondary | ICD-10-CM

## 2018-02-17 DIAGNOSIS — M810 Age-related osteoporosis without current pathological fracture: Secondary | ICD-10-CM | POA: Diagnosis present

## 2018-02-17 DIAGNOSIS — R0902 Hypoxemia: Secondary | ICD-10-CM

## 2018-02-17 DIAGNOSIS — D849 Immunodeficiency, unspecified: Secondary | ICD-10-CM

## 2018-02-17 DIAGNOSIS — M059 Rheumatoid arthritis with rheumatoid factor, unspecified: Secondary | ICD-10-CM

## 2018-02-17 DIAGNOSIS — J181 Lobar pneumonia, unspecified organism: Secondary | ICD-10-CM

## 2018-02-17 DIAGNOSIS — J9601 Acute respiratory failure with hypoxia: Principal | ICD-10-CM | POA: Diagnosis present

## 2018-02-17 DIAGNOSIS — I10 Essential (primary) hypertension: Secondary | ICD-10-CM | POA: Diagnosis present

## 2018-02-17 DIAGNOSIS — Z833 Family history of diabetes mellitus: Secondary | ICD-10-CM

## 2018-02-17 DIAGNOSIS — J101 Influenza due to other identified influenza virus with other respiratory manifestations: Secondary | ICD-10-CM | POA: Diagnosis not present

## 2018-02-17 DIAGNOSIS — R05 Cough: Secondary | ICD-10-CM

## 2018-02-17 DIAGNOSIS — J189 Pneumonia, unspecified organism: Secondary | ICD-10-CM

## 2018-02-17 LAB — COMPREHENSIVE METABOLIC PANEL
ALT: 67 U/L — ABNORMAL HIGH (ref 14–54)
ANION GAP: 10 (ref 5–15)
AST: 131 U/L — ABNORMAL HIGH (ref 15–41)
Albumin: 3.1 g/dL — ABNORMAL LOW (ref 3.5–5.0)
Alkaline Phosphatase: 123 U/L (ref 38–126)
BUN: 16 mg/dL (ref 6–20)
CO2: 27 mmol/L (ref 22–32)
Calcium: 8.1 mg/dL — ABNORMAL LOW (ref 8.9–10.3)
Chloride: 99 mmol/L — ABNORMAL LOW (ref 101–111)
Creatinine, Ser: 1.12 mg/dL — ABNORMAL HIGH (ref 0.44–1.00)
GFR calc Af Amer: 58 mL/min — ABNORMAL LOW (ref >60)
GFR, EST NON AFRICAN AMERICAN: 50 mL/min — AB (ref >60)
Glucose, Bld: 99 mg/dL (ref 65–99)
POTASSIUM: 3.9 mmol/L (ref 3.5–5.1)
Sodium: 136 mmol/L (ref 135–145)
TOTAL PROTEIN: 7.3 g/dL (ref 6.5–8.1)
Total Bilirubin: 0.6 mg/dL (ref 0.3–1.2)

## 2018-02-17 LAB — CBC WITH DIFFERENTIAL/PLATELET
Basophils Absolute: 0 10*3/uL (ref 0–0.1)
Basophils Relative: 0 %
EOS PCT: 0 %
Eosinophils Absolute: 0 10*3/uL (ref 0–0.7)
HCT: 34.9 % — ABNORMAL LOW (ref 35.0–47.0)
HEMOGLOBIN: 11.6 g/dL — AB (ref 12.0–16.0)
Lymphocytes Relative: 20 %
Lymphs Abs: 0.9 10*3/uL — ABNORMAL LOW (ref 1.0–3.6)
MCH: 29.9 pg (ref 26.0–34.0)
MCHC: 33.1 g/dL (ref 32.0–36.0)
MCV: 90.3 fL (ref 80.0–100.0)
Monocytes Absolute: 0.9 10*3/uL (ref 0.2–0.9)
Monocytes Relative: 21 %
NEUTROS PCT: 59 %
Neutro Abs: 2.5 10*3/uL (ref 1.4–6.5)
Platelets: 238 10*3/uL (ref 150–440)
RBC: 3.86 MIL/uL (ref 3.80–5.20)
RDW: 17 % — ABNORMAL HIGH (ref 11.5–14.5)
WBC: 4.3 10*3/uL (ref 3.6–11.0)

## 2018-02-17 LAB — INFLUENZA PANEL BY PCR (TYPE A & B)
Influenza A By PCR: POSITIVE — AB
Influenza B By PCR: NEGATIVE

## 2018-02-17 LAB — LACTIC ACID, PLASMA: LACTIC ACID, VENOUS: 1.3 mmol/L (ref 0.5–1.9)

## 2018-02-17 LAB — PROCALCITONIN: PROCALCITONIN: 0.16 ng/mL

## 2018-02-17 MED ORDER — FLUOXETINE HCL 20 MG PO CAPS
40.0000 mg | ORAL_CAPSULE | Freq: Every day | ORAL | Status: DC
  Administered 2018-02-17 – 2018-02-20 (×4): 40 mg via ORAL
  Filled 2018-02-17 (×4): qty 2

## 2018-02-17 MED ORDER — HYDROXYCHLOROQUINE SULFATE 200 MG PO TABS
200.0000 mg | ORAL_TABLET | Freq: Two times a day (BID) | ORAL | Status: DC
  Administered 2018-02-17 – 2018-02-20 (×6): 200 mg via ORAL
  Filled 2018-02-17 (×7): qty 1

## 2018-02-17 MED ORDER — CYCLOBENZAPRINE HCL 10 MG PO TABS
10.0000 mg | ORAL_TABLET | Freq: Every day | ORAL | Status: DC
  Administered 2018-02-17 – 2018-02-19 (×3): 10 mg via ORAL
  Filled 2018-02-17 (×3): qty 1

## 2018-02-17 MED ORDER — IPRATROPIUM-ALBUTEROL 0.5-2.5 (3) MG/3ML IN SOLN
3.0000 mL | Freq: Once | RESPIRATORY_TRACT | Status: AC
  Administered 2018-02-17: 3 mL via RESPIRATORY_TRACT
  Filled 2018-02-17: qty 3

## 2018-02-17 MED ORDER — PRAMIPEXOLE DIHYDROCHLORIDE 0.25 MG PO TABS
0.1250 mg | ORAL_TABLET | Freq: Every day | ORAL | Status: DC
  Administered 2018-02-17 – 2018-02-20 (×4): 0.125 mg via ORAL
  Filled 2018-02-17 (×4): qty 1

## 2018-02-17 MED ORDER — TRAZODONE HCL 50 MG PO TABS
50.0000 mg | ORAL_TABLET | Freq: Every day | ORAL | Status: DC
  Administered 2018-02-17 – 2018-02-19 (×3): 50 mg via ORAL
  Filled 2018-02-17 (×3): qty 1

## 2018-02-17 MED ORDER — HEPARIN SODIUM (PORCINE) 5000 UNIT/ML IJ SOLN
5000.0000 [IU] | Freq: Three times a day (TID) | INTRAMUSCULAR | Status: DC
  Administered 2018-02-17 – 2018-02-20 (×8): 5000 [IU] via SUBCUTANEOUS
  Filled 2018-02-17 (×8): qty 1

## 2018-02-17 MED ORDER — DOCUSATE SODIUM 100 MG PO CAPS: 100.0000 mg | ORAL_CAPSULE | Freq: Two times a day (BID) | ORAL | Status: DC | PRN

## 2018-02-17 MED ORDER — FOLIC ACID 1 MG PO TABS
1.0000 mg | ORAL_TABLET | Freq: Every day | ORAL | Status: DC
  Administered 2018-02-17 – 2018-02-20 (×4): 1 mg via ORAL
  Filled 2018-02-17 (×4): qty 1

## 2018-02-17 MED ORDER — SODIUM CHLORIDE 0.9 % IV SOLN
500.0000 mg | Freq: Once | INTRAVENOUS | Status: AC
  Administered 2018-02-17: 500 mg via INTRAVENOUS
  Filled 2018-02-17: qty 500

## 2018-02-17 MED ORDER — GUAIFENESIN-DM 100-10 MG/5ML PO SYRP
5.0000 mL | ORAL_SOLUTION | ORAL | Status: DC | PRN
  Administered 2018-02-17 – 2018-02-20 (×7): 5 mL via ORAL
  Filled 2018-02-17 (×9): qty 5

## 2018-02-17 MED ORDER — IPRATROPIUM-ALBUTEROL 0.5-2.5 (3) MG/3ML IN SOLN
3.0000 mL | Freq: Once | RESPIRATORY_TRACT | Status: AC
  Administered 2018-02-17: 3 mL via RESPIRATORY_TRACT

## 2018-02-17 MED ORDER — SODIUM CHLORIDE 0.9 % IV SOLN
INTRAVENOUS | Status: DC
  Administered 2018-02-17 – 2018-02-18 (×2): via INTRAVENOUS

## 2018-02-17 MED ORDER — SODIUM CHLORIDE 0.9 % IV SOLN
1.0000 g | INTRAVENOUS | Status: DC
  Administered 2018-02-18: 09:00:00 1 g via INTRAVENOUS
  Filled 2018-02-17: qty 10

## 2018-02-17 MED ORDER — ACETAMINOPHEN 325 MG PO TABS: 650.0000 mg | ORAL_TABLET | Freq: Four times a day (QID) | ORAL | Status: DC | PRN

## 2018-02-17 MED ORDER — DEXTROSE 5 % IV SOLN
250.0000 mg | INTRAVENOUS | Status: DC
  Filled 2018-02-17: qty 250

## 2018-02-17 MED ORDER — OSELTAMIVIR PHOSPHATE 30 MG PO CAPS
30.0000 mg | ORAL_CAPSULE | Freq: Two times a day (BID) | ORAL | Status: DC
  Administered 2018-02-17 – 2018-02-20 (×6): 30 mg via ORAL
  Filled 2018-02-17 (×7): qty 1

## 2018-02-17 MED ORDER — PANTOPRAZOLE SODIUM 40 MG PO TBEC
40.0000 mg | DELAYED_RELEASE_TABLET | Freq: Every day | ORAL | Status: DC
  Administered 2018-02-17 – 2018-02-20 (×4): 40 mg via ORAL
  Filled 2018-02-17 (×4): qty 1

## 2018-02-17 MED ORDER — OSELTAMIVIR PHOSPHATE 30 MG PO CAPS
30.0000 mg | ORAL_CAPSULE | Freq: Once | ORAL | Status: AC
  Administered 2018-02-17: 30 mg via ORAL
  Filled 2018-02-17 (×2): qty 1

## 2018-02-17 MED ORDER — SODIUM CHLORIDE 0.9 % IV SOLN
1.0000 g | Freq: Once | INTRAVENOUS | Status: AC
  Administered 2018-02-17: 1 g via INTRAVENOUS
  Filled 2018-02-17: qty 10

## 2018-02-17 MED ORDER — OSELTAMIVIR PHOSPHATE 75 MG PO CAPS
75.0000 mg | ORAL_CAPSULE | Freq: Two times a day (BID) | ORAL | Status: DC
  Filled 2018-02-17: qty 1

## 2018-02-17 NOTE — H&P (Signed)
[place] [facility] [place] NAME: Tonya Cummings    MR#:  [id number]  DATE OF BIRTH:  [birth year]  DATE OF ADMISSION:  02/17/2018  PRIMARY CARE PHYSICIAN: Dayton Martes Victoriano Lain, NP   REQUESTING/REFERRING PHYSICIAN: Reita Cliche   CHIEF COMPLAINT:   Chief Complaint  Patient presents with  . Shortness of Breath    HISTORY OF PRESENT ILLNESS: Tonya Cummings  is a 67 y.o. female with a known history of hypertension, rheumatoid arthritis on disease modifying drugs, osteoporosis- for last 4-5 days was feeling worsening cough and shortness of breath with on and off fever. She also had some pain on the right side middle and lower chest. She went to urgent care Center, they did chest x-ray and noted she is tachypneic and there was some finding of pneumonia so sent her to emergency room. In ER she is noted as mentioned above with tachypnea, was getting short of breath with minimal exertion, influenza A is positive and she is finding of infiltrate on her chest x-ray so given to admit to hospitalist team.  PAST MEDICAL HISTORY:   Past Medical History:  Diagnosis Date  . Hypertension   . Osteoporosis   . Rheumatoid arthritis ([place])     PAST SURGICAL HISTORY:  Past Surgical History:  Procedure Laterality Date  . CARPAL TUNNEL RELEASE Bilateral   . CHOLECYSTECTOMY    . HIP SURGERY Left   . TUBAL LIGATION      SOCIAL HISTORY:  Social History   Tobacco Use  . Smoking status: Never Smoker  . Smokeless tobacco: Never Used  Substance Use Topics  . Alcohol use: No    FAMILY HISTORY:  Family History  Problem Relation Age of Onset  . Diabetes Mother   . Diabetes Son     DRUG ALLERGIES:  Allergies  Allergen Reactions  . Ibuprofen Other (See Comments)    Headache    REVIEW OF SYSTEMS:   CONSTITUTIONAL: positive for fever, fatigue or weakness.  EYES: No blurred or double vision.  EARS, NOSE, AND THROAT: No tinnitus or ear pain.  RESPIRATORY: she  have cough, shortness of breath,no wheezing or hemoptysis.  CARDIOVASCULAR: No chest pain, orthopnea, edema.  GASTROINTESTINAL: No nausea, vomiting, diarrhea or abdominal pain.  GENITOURINARY: No dysuria, hematuria.  ENDOCRINE: No polyuria, nocturia,  HEMATOLOGY: No anemia, easy bruising or bleeding SKIN: No rash or lesion. MUSCULOSKELETAL: No joint pain or arthritis.   NEUROLOGIC: No tingling, numbness, weakness.  PSYCHIATRY: No anxiety or depression.   MEDICATIONS AT HOME:  Prior to Admission medications   Medication Sig Start Date End Date Taking? Authorizing Provider  Adalimumab (HUMIRA) 40 MG/0.8ML PSKT Inject 40 mg into the skin every 14 (fourteen) days.    Yes [provider]  cyclobenzaprine (FLEXERIL) 10 MG tablet Take 10 mg by mouth at bedtime.   Yes [provider]  enalapril-hydrochlorothiazide (VASERETIC) 10-25 MG per tablet Take 1 tablet by mouth daily.   Yes [provider]  FLUoxetine (PROZAC) 40 MG capsule Take 40 mg by mouth daily.   Yes [provider]  folic acid (FOLVITE) 1 MG tablet Take 1 mg by mouth daily.   Yes [provider]  hydroxychloroquine (PLAQUENIL) 200 MG tablet Take 200 mg by mouth 2 (two) times daily.   Yes [provider]  omeprazole (PRILOSEC) 20 MG capsule Take 20 mg by mouth daily.   Yes [provider]  pramipexole (MIRAPEX) 0.125 MG tablet Take 1 tablet by mouth daily.  02/15/18  Yes [provider]  traZODone (DESYREL) 50 MG tablet Take 1 tablet by mouth at bedtime. 02/15/18  Yes [provider]  amoxicillin-clavulanate (AUGMENTIN) 875-125 MG tablet Take 1 tablet by mouth every 12 (twelve) hours. Patient not taking: Reported on 02/17/2018 11/12/16   Marylene Land, NP  erythromycin ophthalmic ointment Place 1 application into both eyes 4 (four) times daily. For seven days Patient not taking: Reported on 02/17/2018 11/12/16   Marylene Land, NP  methotrexate (50 MG/ML) 1 g  injection Inject 25 mg into the vein once a week.     [provider]      PHYSICAL EXAMINATION:   VITAL SIGNS: Blood pressure 139/85, pulse 84, temperature 98.9 F (37.2 C), resp. rate (!) 31, height 5\' 3"  (1.6 m), weight 95.3 kg (210 lb), SpO2 99 %.  GENERAL:  67 y.o.-year-old patient lying in the bed with no acute distress.  EYES: Pupils equal, round, reactive to light and accommodation. No scleral icterus. Extraocular muscles intact.  HEENT: Head atraumatic, normocephalic. Oropharynx and nasopharynx clear.  NECK:  Supple, no jugular venous distention. No thyroid enlargement, no tenderness.  LUNGS: Normal breath sounds bilaterally, no wheezing, she have crepitation, more on right side. No use of accessory muscles of respiration.  CARDIOVASCULAR: S1, S2 normal. No murmurs, rubs, or gallops.  ABDOMEN: Soft, nontender, nondistended. Bowel sounds present. No organomegaly or mass.  EXTREMITIES: No pedal edema, cyanosis, or clubbing.  NEUROLOGIC: Cranial nerves II through XII are intact. Muscle strength 5/5 in all extremities. Sensation intact. Gait not checked.  PSYCHIATRIC: The patient is alert and oriented x 3.  SKIN: No obvious rash, lesion, or ulcer.   LABORATORY PANEL:   CBC Recent Labs  Lab 02/17/18 1233  WBC 4.3  HGB 11.6*  HCT 34.9*  PLT 238  MCV 90.3  MCH 29.9  MCHC 33.1  RDW 17.0*  LYMPHSABS 0.9*  MONOABS 0.9  EOSABS 0.0  BASOSABS 0.0   ------------------------------------------------------------------------------------------------------------------  Chemistries  Recent Labs  Lab 02/17/18 1315  NA 136  K 3.9  CL 99*  CO2 27  GLUCOSE 99  BUN 16  CREATININE 1.12*  CALCIUM 8.1*  AST 131*  ALT 67*  ALKPHOS 123  BILITOT 0.6   ------------------------------------------------------------------------------------------------------------------ estimated creatinine clearance is 55 mL/min (A) (by C-G formula based on SCr of 1.12 mg/dL  (H)). ------------------------------------------------------------------------------------------------------------------ No results for input(s): TSH, T4TOTAL, T3FREE, THYROIDAB in the last 72 hours.  Invalid input(s): FREET3   Coagulation profile No results for input(s): INR, PROTIME in the last 168 hours. ------------------------------------------------------------------------------------------------------------------- No results for input(s): DDIMER in the last 72 hours. -------------------------------------------------------------------------------------------------------------------  Cardiac Enzymes No results for input(s): CKMB, TROPONINI, MYOGLOBIN in the last 168 hours.  Invalid input(s): CK ------------------------------------------------------------------------------------------------------------------ Invalid input(s): POCBNP  ---------------------------------------------------------------------------------------------------------------  Urinalysis No results found for: COLORURINE, APPEARANCEUR, LABSPEC, PHURINE, GLUCOSEU, HGBUR, BILIRUBINUR, KETONESUR, PROTEINUR, UROBILINOGEN, NITRITE, LEUKOCYTESUR   RADIOLOGY: Dg Chest 2 View  Result Date: 02/17/2018 CLINICAL DATA:  Shortness of breath, cough EXAM: CHEST - 2 VIEW COMPARISON:  10/28/2012 FINDINGS: Consolidation noted in the right lower lobe compatible with pneumonia. Left lung clear. Low lung volumes. Moderate-sized hiatal hernia. Heart is borderline in size. IMPRESSION: Right lower lobe pneumonia. Followup PA and lateral chest X-ray is recommended in 3-4 weeks following trial of antibiotic therapy to ensure resolution and exclude underlying malignancy. Moderate-sized hiatal hernia. Electronically Signed   By: Rolm Baptise M.D.   On: 02/17/2018 10:35    EKG: Orders placed or performed during the hospital encounter of  02/17/18  . EKG 12-Lead  . EKG 12-Lead    IMPRESSION AND PLAN:  * influenza a   Will keep on  droplet isolation, Tamiflu and symptomatic management.  * community-acquired pneumonia   Give Rocephin and azithromycin for now.   We will also check pro calcitonin to decide further antibiotic need over this could be just a viral syndrome.   Blood cultures are sent, and encouraged to use incentive spirometer.  * Hypertension   I will hold her home medications for now.  * rheumatoid arthritis   She is on medications including methotrexate and Humira, currently we will hold with active infection.   She can resume as scheduled once started recovering.  All the records are reviewed and case discussed with ED provider. Management plans discussed with the patient, family and they are in agreement.  CODE STATUS: Full code Code Status History    This patient does not have a recorded code status. Please follow your organizational policy for patients in this situation.       TOTAL TIME TAKING CARE OF THIS PATIENT: 55 minutes.    Vaughan Basta M.D on 02/17/2018   Between 7am to 6pm - Pager - [phone]  After 6pm go to [email or web address] - password [email or web address] [facility]  Office  [phone]  CC: Primary care physician; [name], NP   Note: This dictation was prepared with Dragon dictation along with smaller phrase technology. Any transcriptional errors that result from this process are unintentional.

## 2018-02-17 NOTE — Progress Notes (Signed)
PHARMACY NOTE:  ANTIMICROBIAL RENAL DOSAGE ADJUSTMENT  Current antimicrobial regimen includes a mismatch between antimicrobial dosage and estimated renal function.  As per policy approved by the Pharmacy & Therapeutics and Medical Executive Committees, the antimicrobial dosage will be adjusted accordingly.  Current antimicrobial dosage:  Tamiflu 75 mg PO BID   Indication: flu   Renal Function:  Estimated Creatinine Clearance: 54.2 mL/min (A) (by C-G formula based on SCr of 1.12 mg/dL (H)). []      On intermittent HD, scheduled: []      On CRRT    Antimicrobial dosage has been changed to:  Tamiflu 30 mg PO BID to start 3/8 @ 22:00.   Additional comments:   Thank you for allowing pharmacy to be a part of this patient's care.  Delvon Chipps D, [facility] 02/17/2018 5:08 PM

## 2018-02-17 NOTE — ED Triage Notes (Signed)
Patient c/o cough, congestion, SOB, and bodyaches that started on Sunday.  Patient reports fever on Sunday.

## 2018-02-17 NOTE — ED Triage Notes (Signed)
Pt sent from [facility] urgent care. CXR - pneumonia. Pt c/o shortness of breath since Sunday.

## 2018-02-17 NOTE — ED Notes (Signed)
EMS called to transport patient to ED 

## 2018-02-17 NOTE — Discharge Instructions (Signed)
Chest x-ray shows a pneumonia of the right lower lobe.  There is also some evidence of some possible left lower lobe involvement as well.  Your oxygen saturations have been low, at 90-91% despite a breathing treatment.  Given your rapid breathing, accessory muscle use, and your oxygen need, recommend going to the emergency room via ambulance.  Your history of rheumatoid arthritis with immunosuppression but she had a high risk for a strong pneumonia that would require IV antibiotics to treat.  With that, I am recommending he go to the emergency room via ambulance for further evaluation and treatment.

## 2018-02-17 NOTE — ED Notes (Signed)
Pt states her sx started this past Sunday with fever 104, cough and congestion, states fever went away after 24hrs but the cough and congestion worsened until she went to the urgent care in [facility] and was dx with pneumonia. Pt states worse with exertion . Pt is in NAD at rest on arrival, VSS.Marland Kitchen

## 2018-02-17 NOTE — ED Provider Notes (Signed)
[facility]    CSN: [id number] Arrival date & time: 02/17/18  0943     History   Chief Complaint Chief Complaint  Patient presents with  . Cough  . Fever  . Shortness of Breath    HPI Tonya Cummings is a 67 y.o. female.   Patient is a 67 year old female who presents with complaint of cough, congestion, shortness of breath, and body aches that started on Sunday.  Patient also reports that she had a fever on Sunday.  Patient does have history of rheumatoid arthritis and is taking Plaquenil and methotrexate.  Patient denies any history of lung issues that she knows of in regards to her rheumatoid arthritis.  Patient states she had a temperature of 104 on Sunday but was not seen.  She also reports ear pain but denies any runny nose.  She does not use oxygen at home and has never been a smoker.  She did get the flu shot this year.  Patient states she took her husband to the eye doctor on Thursday and there were some sick patients in the waiting room and she was concerned that she might pick up something from them.  Patient does report feeling short of breath.        Past Medical History:  Diagnosis Date  . Hypertension   . Osteoporosis   . Rheumatoid arthritis ([place])     There are no active problems to display for this patient.   Past Surgical History:  Procedure Laterality Date  . CARPAL TUNNEL RELEASE Bilateral   . CHOLECYSTECTOMY    . HIP SURGERY Left   . TUBAL LIGATION      OB History    No data available       Home Medications    Prior to Admission medications   Medication Sig Start Date End Date Taking? Authorizing Provider  Adalimumab (HUMIRA) 40 MG/0.8ML PSKT Inject into the skin.   Yes [provider]  cyclobenzaprine (FLEXERIL) 10 MG tablet Take 10 mg by mouth at bedtime.   Yes [provider]  enalapril-hydrochlorothiazide (VASERETIC) 10-25 MG per tablet Take 1 tablet by mouth daily.   Yes [provider]    erythromycin ophthalmic ointment Place 1 application into both eyes 4 (four) times daily. For seven days 11/12/16  Yes Marylene Land, NP  FLUoxetine (PROZAC) 40 MG capsule Take 40 mg by mouth daily.   Yes [provider]  folic acid (FOLVITE) 1 MG tablet Take 1 mg by mouth daily.   Yes [provider]  hydroxychloroquine (PLAQUENIL) 200 MG tablet Take 200 mg by mouth 2 (two) times daily.   Yes [provider]  methotrexate (50 MG/ML) 1 g injection Inject 25 mg into the vein once.   Yes [provider]  omeprazole (PRILOSEC) 20 MG capsule Take 20 mg by mouth daily.   Yes [provider]  amoxicillin-clavulanate (AUGMENTIN) 875-125 MG tablet Take 1 tablet by mouth every 12 (twelve) hours. 11/12/16   Marylene Land, NP    Family History History reviewed. No pertinent family history.  Social History Social History   Tobacco Use  . Smoking status: Never Smoker  . Smokeless tobacco: Never Used  Substance Use Topics  . Alcohol use: No  . Drug use: No     Allergies   Ibuprofen   Review of Systems Review of Systems  As noted above in HPI.  Other systems reviewed and found to be negative.   Physical Exam Triage  Vital Signs ED Triage Vitals  Enc Vitals Group     BP 02/17/18 1007 118/68     Pulse Rate 02/17/18 1007 85     Resp 02/17/18 1007 17     Temp 02/17/18 1007 98.5 F (36.9 C)     Temp Source 02/17/18 1007 Oral     SpO2 02/17/18 1007 91 %     Weight 02/17/18 1004 210 lb (95.3 kg)     Height 02/17/18 1004 5\' 3"  (1.6 m)     Head Circumference --      Peak Flow --      Pain Score 02/17/18 1004 5     Pain Loc --      Pain Edu? --      Excl. in [place]? --    No data found.  Updated Vital Signs BP 118/68 (BP Location: Left Arm)   Pulse 88   Temp 98.5 F (36.9 C) (Oral)   Resp 16   Ht 5\' 3"  (1.6 m)   Wt 210 lb (95.3 kg)   SpO2 97%   BMI 37.20 kg/m   Visual Acuity Right Eye Distance:   Left Eye Distance:   Bilateral  Distance:    Right Eye Near:   Left Eye Near:    Bilateral Near:     Physical Exam  Constitutional: She is oriented to person, place, and time. She appears well-developed and well-nourished.  Patient a little short of breath, saturation of 91% on room air.  HENT:  Head: Normocephalic and atraumatic.  Right Ear: A middle ear effusion is present.  Left Ear: A middle ear effusion is present.  Eyes: EOM are normal. Pupils are equal, round, and reactive to light.  Pulmonary/Chest:  Patient respiratory rate of 32 with some neck muscle usage.  Patient with a mid inspiratory squeak on the right.  Left lung is clear to auscultation.  No stridor or rhonchi noted.  Mild shortness of breath.  Minimal wheeze and strong cough with forced expiration.   Abdominal: Soft.  Musculoskeletal: Normal range of motion.  Neurological: She is alert and oriented to person, place, and time. No cranial nerve deficit.  Skin: Skin is warm and dry.  Vitals reviewed.    UC Treatments / Results  Labs (all labs ordered are listed, but only abnormal results are displayed) Labs Reviewed - No data to display  EKG  EKG Interpretation None       Radiology Dg Chest 2 View  Result Date: 02/17/2018 CLINICAL DATA:  Shortness of breath, cough EXAM: CHEST - 2 VIEW COMPARISON:  10/28/2012 FINDINGS: Consolidation noted in the right lower lobe compatible with pneumonia. Left lung clear. Low lung volumes. Moderate-sized hiatal hernia. Heart is borderline in size. IMPRESSION: Right lower lobe pneumonia. Followup PA and lateral chest X-ray is recommended in 3-4 weeks following trial of antibiotic therapy to ensure resolution and exclude underlying malignancy. Moderate-sized hiatal hernia. Electronically Signed   By: Rolm Baptise M.D.   On: 02/17/2018 10:35    Procedures Procedures (including critical care time)  Medications Ordered in UC Medications  ipratropium-albuterol (DUONEB) 0.5-2.5 (3) MG/3ML nebulizer solution 3  mL (3 mLs Nebulization Given 02/17/18 1013)     Initial Impression / Assessment and Plan / UC Course  I have reviewed the triage vital signs and the nursing notes.  Pertinent labs & imaging results that were available during my care of the patient were reviewed by me and considered in my medical decision making (see chart for details).  Patient with saturation of 91% on room air on arrival which did not improve with nebulizer treatment.  Post nebulizer treatment was 90-91% on room air.  X-ray with a right lower lobe pneumonia and may be some early aspect of the left with a left heart border being disturbed.  Patient does have a history of rheumatoid arthritis and has immunosuppressed with Plaquenil and methotrexate.  Given her shortness of breath, her tachypnea, accessory muscle use, and height hypoxia in the setting of her immunosuppression do not believe that oral antibiotics will be adequate enough to treat her current respiratory symptoms.  Patient recommended that she go to the ER via ambulance and is in agreement.   Final Clinical Impressions(s) / UC Diagnoses   Final diagnoses:  Pneumonia of right lower lobe due to infectious organism ([place])  Rheumatoid arthritis with positive rheumatoid factor, involving unspecified site [facility])  Immunosuppressed status ([place])  Hypoxia    ED Discharge Orders    None       Controlled Substance Prescriptions [place] Controlled Substance Registry consulted? Not Applicable   Luvenia Redden, PA-C 02/17/18 1106

## 2018-02-17 NOTE — ED Provider Notes (Signed)
[facility] Emergency Department Provider Note ____________________________________________   I have reviewed the triage vital signs and the triage nursing note.  HISTORY  Chief Complaint Shortness of Breath   Historian Patient  HPI Tonya Cummings is a 67 y.o. female presenting from urgent care with complaint of fever and shortness of breath since Sunday, diagnosed with right-sided pneumonia at urgent care with low oxygen sats and was sent to the ED for further evaluation.  Patient states fever to 104 on Sunday.  Last dose of antipyretic was yesterday.  She is continued to have worsening cough and trouble breathing.  Some nonproductive cough.  Shortness of breath is worse with any sort of walking or exertion.  No significant chest pain. She has had generalized fatigue and body aches.  Symptoms moderate.   Past Medical History:  Diagnosis Date  . Hypertension   . Osteoporosis   . Rheumatoid arthritis ([place])     There are no active problems to display for this patient.   Past Surgical History:  Procedure Laterality Date  . CARPAL TUNNEL RELEASE Bilateral   . CHOLECYSTECTOMY    . HIP SURGERY Left   . TUBAL LIGATION      Prior to Admission medications   Medication Sig Start Date End Date Taking? Authorizing Provider  Adalimumab (HUMIRA) 40 MG/0.8ML PSKT Inject 40 mg into the skin every 14 (fourteen) days.    Yes [provider]  cyclobenzaprine (FLEXERIL) 10 MG tablet Take 10 mg by mouth at bedtime.   Yes [provider]  enalapril-hydrochlorothiazide (VASERETIC) 10-25 MG per tablet Take 1 tablet by mouth daily.   Yes [provider]  FLUoxetine (PROZAC) 40 MG capsule Take 40 mg by mouth daily.   Yes [provider]  folic acid (FOLVITE) 1 MG tablet Take 1 mg by mouth daily.   Yes [provider]  hydroxychloroquine (PLAQUENIL) 200 MG tablet Take 200 mg by mouth 2 (two) times daily.   Yes [provider]  omeprazole (PRILOSEC) 20 MG capsule Take 20 mg by mouth daily.   Yes [provider]  pramipexole (MIRAPEX) 0.125 MG tablet Take 1 tablet by mouth daily. 02/15/18  Yes [provider]  traZODone (DESYREL) 50 MG tablet Take 1 tablet by mouth at bedtime. 02/15/18  Yes [provider]  amoxicillin-clavulanate (AUGMENTIN) 875-125 MG tablet Take 1 tablet by mouth every 12 (twelve) hours. Patient not taking: Reported on 02/17/2018 11/12/16   Marylene Land, NP  erythromycin ophthalmic ointment Place 1 application into both eyes 4 (four) times daily. For seven days Patient not taking: Reported on 02/17/2018 11/12/16   Marylene Land, NP  methotrexate (50 MG/ML) 1 g injection Inject 25 mg into the vein once a week.     [provider]    Allergies  Allergen Reactions  . Ibuprofen Other (See Comments)    Headache    No family history on file.  Social History Social History   Tobacco Use  . Smoking status: Never Smoker  . Smokeless tobacco: Never Used  Substance Use Topics  . Alcohol use: No  . Drug use: No    Review of Systems  Constitutional: Positive for fever. Eyes: Negative for visual changes. ENT: Negative for sore throat. Cardiovascular: Negative for chest pain. Respiratory: Positive for shortness of breath. Gastrointestinal: Negative for abdominal pain, vomiting and diarrhea. Genitourinary: Negative for dysuria. Musculoskeletal: Negative for back pain. Skin: Negative for rash. Neurological: Negative for headache.  ____________________________________________   PHYSICAL  EXAM:  VITAL SIGNS: ED Triage Vitals [02/17/18 1148]  Enc Vitals Group     BP 127/73     Pulse Rate 89     Resp 18     Temp 98.9 F (37.2 C)     Temp src      SpO2 95 %     Weight 210 lb (95.3 kg)     Height 5\' 3"  (1.6 m)     Head Circumference      Peak Flow      Pain Score 5     Pain Loc      Pain Edu?      Excl. in [place]?      Constitutional:  Alert and oriented. Well appearing and in no distress. HEENT   Head: Normocephalic and atraumatic.      Eyes: Conjunctivae are normal. Pupils equal and round.       Ears:         Nose: No congestion/rhinnorhea.   Mouth/Throat: Mucous membranes are moist.   Neck: No stridor. Cardiovascular/Chest: Normal rate, regular rhythm.  No murmurs, rubs, or gallops. Respiratory: Normal respiratory effort without tachypnea nor retractions.  Mild decreased breath sounds throughout.  Bronchospastic cough.  Mild rhonchi both bases.  No rales. Gastrointestinal: Soft. No distention, no guarding, no rebound. Nontender.    Genitourinary/rectal:Deferred Musculoskeletal: Nontender with normal range of motion in all extremities. No joint effusions.  No lower extremity tenderness.  No edema. Neurologic:  Normal speech and language. No gross or focal neurologic deficits are appreciated. Skin:  Skin is warm, dry and intact. No rash noted. Psychiatric: Mood and affect are normal. Speech and behavior are normal. Patient exhibits appropriate insight and judgment.   ____________________________________________  LABS (pertinent positives/negatives) I, Lisa Roca, MD the attending physician have reviewed the labs noted below.  Labs Reviewed  CBC WITH DIFFERENTIAL/PLATELET - Abnormal; Notable for the following components:      Result Value   Hemoglobin 11.6 (*)    HCT 34.9 (*)    RDW 17.0 (*)    Lymphs Abs 0.9 (*)    All other components within normal limits  INFLUENZA PANEL BY PCR (TYPE A & B) - Abnormal; Notable for the following components:   Influenza A By PCR POSITIVE (*)    All other components within normal limits  COMPREHENSIVE METABOLIC PANEL - Abnormal; Notable for the following components:   Chloride 99 (*)    Creatinine, Ser 1.12 (*)    Calcium 8.1 (*)    Albumin 3.1 (*)    AST 131 (*)    ALT 67 (*)    GFR calc non Af Amer 50 (*)    GFR calc Af Amer 58 (*)    All other components  within normal limits  CULTURE, BLOOD (ROUTINE X 2)  CULTURE, BLOOD (ROUTINE X 2)  LACTIC ACID, PLASMA  LACTIC ACID, PLASMA    ____________________________________________    EKG I, Lisa Roca, MD, the attending physician have personally viewed and interpreted all ECGs.  83 bpm.  Normal sinus rhythm.  Narrow QS.  Normal axis.  Nonspecific flattening of the T waves. ____________________________________________  RADIOLOGY   Chest x-ray 2 view from earlier today at the urgent care reviewed by myself, right lower lobe pneumonia Radiologist report:IMPRESSION: Right lower lobe pneumonia. Followup PA and lateral chest X-ray is recommended in 3-4 weeks following trial of antibiotic therapy to ensure resolution and exclude underlying malignancy.  Moderate-sized hiatal hernia. __________________________________________  PROCEDURES  Procedure(s) performed:  None  Critical Care performed: None   ____________________________________________  ED COURSE / ASSESSMENT AND PLAN  Pertinent labs & imaging results that were available during my care of the patient were reviewed by me and considered in my medical decision making (see chart for details).    Patient with diagnosed radiographic right lower lobe pneumonia, community-acquired.  Tachypneic and hypoxic with walking up into the respiratory rate in the 30s, O2 sat in the 80s.  At rest on 2 L, O2 sat is 97-99%.  We will send blood cultures, but this point do not have a high suspicion for sepsis.  She does have a bronchospastic cough, no underlying known history of prior asthma or COPD.  We will give her a DuoNeb treatment.  I am adding on a flu test.  Given hypoxia, patient is going to need hospital admission/observation for management of pneumonia with hypoxia.  Patient does have influenza A.  She is multiple days since onset of symptoms, however given that with the hypoxia she needs hospital admission, we will go ahead and  treat with Tamiflu as well.  Lactate not elevated, no elevated white blood cell count, no tachycardia or hypotension.  Patient not on sepsis pathway at this point.  However given hypoxia and fluid in right lower lobe infiltrate, cover with antibiotics and treat also with Tamiflu.  Plan for hospital admission.   CONSULTATIONS:   Hospitalist for admission.   Patient / Family / Caregiver informed of clinical course, medical decision-making process, and agree with plan.     ___________________________________________   FINAL CLINICAL IMPRESSION(S) / ED DIAGNOSES   Final diagnoses:  Pneumonia of right lower lobe due to infectious organism [facility])  Influenza A      ___________________________________________        Note: This dictation was prepared with Dragon dictation. Any transcriptional errors that result from this process are unintentional    Lisa Roca, MD 02/17/18 1416

## 2018-02-18 LAB — BASIC METABOLIC PANEL
ANION GAP: 11 (ref 5–15)
BUN: 14 mg/dL (ref 6–20)
CHLORIDE: 103 mmol/L (ref 101–111)
CO2: 24 mmol/L (ref 22–32)
Calcium: 7.8 mg/dL — ABNORMAL LOW (ref 8.9–10.3)
Creatinine, Ser: 1.07 mg/dL — ABNORMAL HIGH (ref 0.44–1.00)
GFR calc non Af Amer: 53 mL/min — ABNORMAL LOW (ref >60)
GLUCOSE: 103 mg/dL — AB (ref 65–99)
POTASSIUM: 4 mmol/L (ref 3.5–5.1)
Sodium: 138 mmol/L (ref 135–145)

## 2018-02-18 LAB — CBC
HEMATOCRIT: 31.5 % — AB (ref 35.0–47.0)
HEMOGLOBIN: 10.4 g/dL — AB (ref 12.0–16.0)
MCH: 29.7 pg (ref 26.0–34.0)
MCHC: 33.1 g/dL (ref 32.0–36.0)
MCV: 90 fL (ref 80.0–100.0)
Platelets: 220 10*3/uL (ref 150–440)
RBC: 3.5 MIL/uL — AB (ref 3.80–5.20)
RDW: 16.8 % — ABNORMAL HIGH (ref 11.5–14.5)
WBC: 3.9 10*3/uL (ref 3.6–11.0)

## 2018-02-18 MED ORDER — SODIUM CHLORIDE 0.9 % IV SOLN
INTRAVENOUS | Status: DC
  Administered 2018-02-18 – 2018-02-19 (×2): via INTRAVENOUS

## 2018-02-18 NOTE — Progress Notes (Signed)
Patient requested to have fluids started back due to not feeling well. Received order from Dr. Anselm Jungling to start NS back at 39ml/hr.

## 2018-02-18 NOTE — Progress Notes (Signed)
[place] [facility] [place] NAME: Tonya Cummings    MR#:  [id number]  DATE OF BIRTH:  1951/08/06  SUBJECTIVE:  CHIEF COMPLAINT:   Chief Complaint  Patient presents with  . Shortness of Breath    Came with shortness of breath and fever, noted to have influenza A.  REVIEW OF SYSTEMS:  CONSTITUTIONAL: No fever, positive for fatigue or weakness.  EYES: No blurred or double vision.  EARS, NOSE, AND THROAT: No tinnitus or ear pain.  RESPIRATORY: have cough, shortness of breath, wheezing , no hemoptysis.  CARDIOVASCULAR: No chest pain, orthopnea, edema.  GASTROINTESTINAL: No nausea, vomiting, diarrhea or abdominal pain.  GENITOURINARY: No dysuria, hematuria.  ENDOCRINE: No polyuria, nocturia,  HEMATOLOGY: No anemia, easy bruising or bleeding SKIN: No rash or lesion. MUSCULOSKELETAL: No joint pain or arthritis.   NEUROLOGIC: No tingling, numbness, weakness.  PSYCHIATRY: No anxiety or depression.   ROS  DRUG ALLERGIES:   Allergies  Allergen Reactions  . Ibuprofen Other (See Comments)    Headache    VITALS:  Blood pressure (!) 149/69, pulse 91, temperature 98.4 F (36.9 C), temperature source Oral, resp. rate 16, height 5\' 3"  (1.6 m), weight 92.7 kg (204 lb 5.9 oz), SpO2 99 %.  PHYSICAL EXAMINATION:  GENERAL:  67 y.o.-year-old patient lying in the bed with no acute distress.  EYES: Pupils equal, round, reactive to light and accommodation. No scleral icterus. Extraocular muscles intact.  HEENT: Head atraumatic, normocephalic. Oropharynx and nasopharynx clear.  NECK:  Supple, no jugular venous distention. No thyroid enlargement, no tenderness.  LUNGS: Normal breath sounds bilaterally, no wheezing, some crepitation. No use of accessory muscles of respiration. On Oxygen supplementations. CARDIOVASCULAR: S1, S2 normal. No murmurs, rubs, or gallops.  ABDOMEN: Soft, nontender, nondistended. Bowel sounds present. No organomegaly or mass.   EXTREMITIES: No pedal edema, cyanosis, or clubbing.  NEUROLOGIC: Cranial nerves II through XII are intact. Muscle strength 5/5 in all extremities. Sensation intact. Gait not checked.  PSYCHIATRIC: The patient is alert and oriented x 3.  SKIN: No obvious rash, lesion, or ulcer.   Physical Exam LABORATORY PANEL:   CBC Recent Labs  Lab 02/18/18 0417  WBC 3.9  HGB 10.4*  HCT 31.5*  PLT 220   ------------------------------------------------------------------------------------------------------------------  Chemistries  Recent Labs  Lab 02/17/18 1315 02/18/18 0417  NA 136 138  K 3.9 4.0  CL 99* 103  CO2 27 24  GLUCOSE 99 103*  BUN 16 14  CREATININE 1.12* 1.07*  CALCIUM 8.1* 7.8*  AST 131*  --   ALT 67*  --   ALKPHOS 123  --   BILITOT 0.6  --    ------------------------------------------------------------------------------------------------------------------  Cardiac Enzymes No results for input(s): TROPONINI in the last 168 hours. ------------------------------------------------------------------------------------------------------------------  RADIOLOGY:  Dg Chest 2 View  Result Date: 02/17/2018 CLINICAL DATA:  Shortness of breath, cough EXAM: CHEST - 2 VIEW COMPARISON:  10/28/2012 FINDINGS: Consolidation noted in the right lower lobe compatible with pneumonia. Left lung clear. Low lung volumes. Moderate-sized hiatal hernia. Heart is borderline in size. IMPRESSION: Right lower lobe pneumonia. Followup PA and lateral chest X-ray is recommended in 3-4 weeks following trial of antibiotic therapy to ensure resolution and exclude underlying malignancy. Moderate-sized hiatal hernia. Electronically Signed   By: Rolm Baptise M.D.   On: 02/17/2018 10:35    ASSESSMENT AND PLAN:   Principal Problem:   Influenza A Active Problems:   Community acquired pneumonia  * influenza a- ac respi failure with hypoxia  Will keep on droplet isolation, Tamiflu and symptomatic  management.  * community-acquired pneumonia- suspected but ruled out.   stop Rocephin and azithromycin for now.   as procalcitonin is not high.   Blood cultures are sent, and encouraged to use incentive spirometer.  * Hypertension   I will hold her home medications for now.  * rheumatoid arthritis   She is on medications including methotrexate and Humira, currently we will hold with active infection.   She can resume as scheduled once started recovering.    All the records are reviewed and case discussed with Care Management/Social Workerr. Management plans discussed with the patient, family and they are in agreement.  CODE STATUS: Full.  TOTAL TIME TAKING CARE OF THIS PATIENT: 40 minutes.     POSSIBLE D/C IN 1-2 DAYS, DEPENDING ON CLINICAL CONDITION.   Vaughan Basta M.D on 02/18/2018   Between 7am to 6pm - Pager - [phone]  After 6pm go to [email or web address] - password [email or web address] [email or web address]  Office  [phone]  CC: Primary care physician; [name], NP  Note: This dictation was prepared with Dragon dictation along with smaller phrase technology. Any transcriptional errors that result from this process are unintentional.

## 2018-02-18 NOTE — Progress Notes (Signed)
Dr. Anselm Jungling requested to wean patient off of 02, sats dropped down to 85% on RA, 2L reapplied to patient. Notified Dr. Anselm Jungling

## 2018-02-18 NOTE — Plan of Care (Signed)
  Education: Knowledge of General Education information will improve 02/18/2018 1650 - Progressing by Herbie Baltimore, RN   Health Behavior/Discharge Planning: Ability to manage health-related needs will improve 02/18/2018 1650 - Progressing by Herbie Baltimore, RN   Clinical Measurements: Ability to maintain clinical measurements within normal limits will improve 02/18/2018 1650 - Progressing by Herbie Baltimore, RN Will remain free from infection 02/18/2018 1650 - Progressing by Herbie Baltimore, RN Diagnostic test results will improve 02/18/2018 1650 - Progressing by Herbie Baltimore, RN Respiratory complications will improve 02/18/2018 1650 - Progressing by Herbie Baltimore, RN Cardiovascular complication will be avoided 02/18/2018 1650 - Progressing by Herbie Baltimore, RN   Activity: Risk for activity intolerance will decrease 02/18/2018 1650 - Progressing by Herbie Baltimore, RN   Nutrition: Adequate nutrition will be maintained 02/18/2018 1650 - Progressing by Herbie Baltimore, RN   Coping: Level of anxiety will decrease 02/18/2018 1650 - Progressing by Herbie Baltimore, RN   Elimination: Will not experience complications related to bowel motility 02/18/2018 1650 - Progressing by Herbie Baltimore, RN Will not experience complications related to urinary retention 02/18/2018 1650 - Progressing by Herbie Baltimore, RN   Pain Managment: General experience of comfort will improve 02/18/2018 1650 - Progressing by Herbie Baltimore, RN   Safety: Ability to remain free from injury will improve 02/18/2018 1650 - Progressing by Herbie Baltimore, RN   Skin Integrity: Risk for impaired skin integrity will decrease 02/18/2018 1650 - Progressing by Herbie Baltimore, RN

## 2018-02-19 NOTE — Plan of Care (Signed)
  Education: Knowledge of General Education information will improve 02/19/2018 1303 - Progressing by Herbie Baltimore, RN   Health Behavior/Discharge Planning: Ability to manage health-related needs will improve 02/19/2018 1303 - Progressing by Herbie Baltimore, RN   Clinical Measurements: Ability to maintain clinical measurements within normal limits will improve 02/19/2018 1303 - Progressing by Herbie Baltimore, RN Will remain free from infection 02/19/2018 1303 - Progressing by Herbie Baltimore, RN Diagnostic test results will improve 02/19/2018 1303 - Progressing by Herbie Baltimore, RN Respiratory complications will improve 02/19/2018 1303 - Progressing by Herbie Baltimore, RN Cardiovascular complication will be avoided 02/19/2018 1303 - Progressing by Herbie Baltimore, RN   Activity: Risk for activity intolerance will decrease 02/19/2018 1303 - Progressing by Herbie Baltimore, RN   Nutrition: Adequate nutrition will be maintained 02/19/2018 1303 - Progressing by Herbie Baltimore, RN   Coping: Level of anxiety will decrease 02/19/2018 1303 - Progressing by Herbie Baltimore, RN   Elimination: Will not experience complications related to bowel motility 02/19/2018 1303 - Progressing by Herbie Baltimore, RN Will not experience complications related to urinary retention 02/19/2018 1303 - Progressing by Herbie Baltimore, RN   Pain Managment: General experience of comfort will improve 02/19/2018 1303 - Progressing by Herbie Baltimore, RN   Safety: Ability to remain free from injury will improve 02/19/2018 1303 - Progressing by Herbie Baltimore, RN   Skin Integrity: Risk for impaired skin integrity will decrease 02/19/2018 1303 - Progressing by Herbie Baltimore, RN

## 2018-02-19 NOTE — Progress Notes (Signed)
[facility] at [place] NAME: Tonya Cummings    MR#:  [id number]  DATE OF BIRTH:  June 11, 1951  SUBJECTIVE:  CHIEF COMPLAINT:   Chief Complaint  Patient presents with  . Shortness of Breath    Came with shortness of breath and fever, noted to have influenza A.   Till has significant shortness of breath with minimal exertion of getting up and walking to the bathroom.  REVIEW OF SYSTEMS:  CONSTITUTIONAL: No fever, positive for fatigue or weakness.  EYES: No blurred or double vision.  EARS, NOSE, AND THROAT: No tinnitus or ear pain.  RESPIRATORY: have cough, shortness of breath, wheezing , no hemoptysis.  CARDIOVASCULAR: No chest pain, orthopnea, edema.  GASTROINTESTINAL: No nausea, vomiting, diarrhea or abdominal pain.  GENITOURINARY: No dysuria, hematuria.  ENDOCRINE: No polyuria, nocturia,  HEMATOLOGY: No anemia, easy bruising or bleeding SKIN: No rash or lesion. MUSCULOSKELETAL: No joint pain or arthritis.   NEUROLOGIC: No tingling, numbness, weakness.  PSYCHIATRY: No anxiety or depression.   ROS  DRUG ALLERGIES:   Allergies  Allergen Reactions  . Ibuprofen Other (See Comments)    Headache    VITALS:  Blood pressure 127/71, pulse 88, temperature 98.7 F (37.1 C), temperature source Oral, resp. rate 16, height 5\' 3"  (1.6 m), weight 92.7 kg (204 lb 5.9 oz), SpO2 97 %.  PHYSICAL EXAMINATION:  GENERAL:  67 y.o.-year-old patient lying in the bed with no acute distress.  EYES: Pupils equal, round, reactive to light and accommodation. No scleral icterus. Extraocular muscles intact.  HEENT: Head atraumatic, normocephalic. Oropharynx and nasopharynx clear.  NECK:  Supple, no jugular venous distention. No thyroid enlargement, no tenderness.  LUNGS: Normal breath sounds bilaterally, no wheezing, some crepitation. No use of accessory muscles of respiration. On Oxygen supplementations. CARDIOVASCULAR: S1, S2 normal. No murmurs, rubs, or  gallops.  ABDOMEN: Soft, nontender, nondistended. Bowel sounds present. No organomegaly or mass.  EXTREMITIES: No pedal edema, cyanosis, or clubbing.  NEUROLOGIC: Cranial nerves II through XII are intact. Muscle strength 5/5 in all extremities. Sensation intact. Gait not checked.  PSYCHIATRIC: The patient is alert and oriented x 3.  SKIN: No obvious rash, lesion, or ulcer.   Physical Exam LABORATORY PANEL:   CBC Recent Labs  Lab 02/18/18 0417  WBC 3.9  HGB 10.4*  HCT 31.5*  PLT 220   ------------------------------------------------------------------------------------------------------------------  Chemistries  Recent Labs  Lab 02/17/18 1315 02/18/18 0417  NA 136 138  K 3.9 4.0  CL 99* 103  CO2 27 24  GLUCOSE 99 103*  BUN 16 14  CREATININE 1.12* 1.07*  CALCIUM 8.1* 7.8*  AST 131*  --   ALT 67*  --   ALKPHOS 123  --   BILITOT 0.6  --    ------------------------------------------------------------------------------------------------------------------  Cardiac Enzymes No results for input(s): TROPONINI in the last 168 hours. ------------------------------------------------------------------------------------------------------------------  RADIOLOGY:  No results found.  ASSESSMENT AND PLAN:   Principal Problem:   Influenza A Active Problems:   Community acquired pneumonia  * influenza a- ac respi failure with hypoxia   Will keep on droplet isolation, Tamiflu and symptomatic management.  till significant shortness of breath on minimal exertion, continue supplemental oxygen and try to taper.   Encouraged to use incentive spirometer.  * community-acquired pneumonia- suspected but ruled out.   stop Rocephin and azithromycin for now.   as procalcitonin is not high.   Blood cultures are sent, and encouraged to use incentive spirometer.  * Hypertension  I will hold her home medications for now.  * rheumatoid arthritis   She is on medications including  methotrexate and Humira, currently we will hold with active infection.   She can resume as scheduled once started recovering.   All the records are reviewed and case discussed with Care Management/Social Workerr. Management plans discussed with the patient, family and they are in agreement.  CODE STATUS: Full.  TOTAL TIME TAKING CARE OF THIS PATIENT: 40 minutes.    POSSIBLE D/C IN 1-2 DAYS, DEPENDING ON CLINICAL CONDITION.   Vaughan Basta M.D on 02/19/2018   Between 7am to 6pm - Pager - [phone]  After 6pm go to [email or web address] - password [email or web address] [facility]  Office  [phone]  CC: Primary care physician; [name], NP  Note: This dictation was prepared with Dragon dictation along with smaller phrase technology. Any transcriptional errors that result from this process are unintentional.

## 2018-02-19 NOTE — Progress Notes (Signed)
Patient of room air, sats at 92%, patient coughing increased and complaining of SOB, requested to place 02 back on. 2L of 02 placed, sats at 94%, patient reports to not fee SOB, coughing decreased.

## 2018-02-20 LAB — HIV ANTIBODY (ROUTINE TESTING W REFLEX): HIV Screen 4th Generation wRfx: NONREACTIVE

## 2018-02-20 MED ORDER — GUAIFENESIN-DM 100-10 MG/5ML PO SYRP: 5.0000 mL | ORAL_SOLUTION | ORAL | 0 refills | Status: DC | PRN

## 2018-02-20 MED ORDER — OSELTAMIVIR PHOSPHATE 30 MG PO CAPS: 30.0000 mg | ORAL_CAPSULE | Freq: Two times a day (BID) | ORAL | 0 refills | Status: AC

## 2018-02-20 NOTE — Progress Notes (Signed)
Discharge instructions reviewed with patient and patient's spouse with patient's permission. Both parties verbalized understanding. Patient reminded to pick up meds sent to pharmacy. IV dc'ed with cath intact and dry dressing applied to site.

## 2018-02-20 NOTE — Plan of Care (Signed)
  Education: Knowledge of General Education information will improve 02/20/2018 0146 - Progressing by Jeffie Pollock, RN   Health Behavior/Discharge Planning: Ability to manage health-related needs will improve 02/20/2018 0146 - Progressing by Jeffie Pollock, RN   Clinical Measurements: Ability to maintain clinical measurements within normal limits will improve 02/20/2018 0146 - Progressing by Jeffie Pollock, RN Will remain free from infection 02/20/2018 0146 - Progressing by Jeffie Pollock, RN Diagnostic test results will improve 02/20/2018 0146 - Progressing by Jeffie Pollock, RN Respiratory complications will improve 02/20/2018 0146 - Progressing by Jeffie Pollock, RN Cardiovascular complication will be avoided 02/20/2018 0146 - Progressing by Jeffie Pollock, RN   Activity: Risk for activity intolerance will decrease 02/20/2018 0146 - Progressing by Jeffie Pollock, RN   Nutrition: Adequate nutrition will be maintained 02/20/2018 0146 - Progressing by Jeffie Pollock, RN   Coping: Level of anxiety will decrease 02/20/2018 0146 - Progressing by Jeffie Pollock, RN   Elimination: Will not experience complications related to bowel motility 02/20/2018 0146 - Progressing by Jeffie Pollock, RN Will not experience complications related to urinary retention 02/20/2018 0146 - Progressing by Jeffie Pollock, RN   Pain Managment: General experience of comfort will improve 02/20/2018 0146 - Progressing by Jeffie Pollock, RN   Safety: Ability to remain free from injury will improve 02/20/2018 0146 - Progressing by Jeffie Pollock, RN   Skin Integrity: Risk for impaired skin integrity will decrease 02/20/2018 0146 - Progressing by Jeffie Pollock, RN

## 2018-02-20 NOTE — Progress Notes (Signed)
Patient's Rx for Tamiflu mistakenly left out of discharge packet. Found later in the evening. Called patient's pharmacy, who was able to accept the prescription via fax. Rx for Tamiflu faxed via secure line to [facility] in [place] and confirmation of receipt of prescription confirmed. Patient, called and made aware that prescription had been sent to pharmacy and apologies given to patient.

## 2018-02-20 NOTE — Progress Notes (Signed)
SATURATION QUALIFICATIONS: (This note is used to comply with regulatory documentation for home oxygen)  Patient Saturations on Room Air at Rest = 98%  Patient Saturations on Room Air while Ambulating = 94%  Patient Saturations on 0 Liters of oxygen while Ambulating = 0%  Please briefly explain why patient needs home oxygen:no home  Oxygen needed

## 2018-02-20 NOTE — Plan of Care (Signed)
  Progressing Education: Knowledge of General Education information will improve 02/20/2018 0754 - Progressing by Rowe Robert, RN Health Behavior/Discharge Planning: Ability to manage health-related needs will improve 02/20/2018 0754 - Progressing by Rowe Robert, RN Clinical Measurements: Ability to maintain clinical measurements within normal limits will improve 02/20/2018 0754 - Progressing by Rowe Robert, RN Will remain free from infection 02/20/2018 0754 - Progressing by Rowe Robert, RN Diagnostic test results will improve 02/20/2018 0754 - Progressing by Rowe Robert, RN Respiratory complications will improve 02/20/2018 0754 - Progressing by Rowe Robert, RN Cardiovascular complication will be avoided 02/20/2018 0754 - Progressing by Rowe Robert, RN Activity: Risk for activity intolerance will decrease 02/20/2018 0754 - Progressing by Rowe Robert, RN Nutrition: Adequate nutrition will be maintained 02/20/2018 0754 - Progressing by Rowe Robert, RN Coping: Level of anxiety will decrease 02/20/2018 0754 - Progressing by Rowe Robert, RN Elimination: Will not experience complications related to bowel motility 02/20/2018 0754 - Progressing by Rowe Robert, RN Will not experience complications related to urinary retention 02/20/2018 0754 - Progressing by Rowe Robert, RN Pain Managment: General experience of comfort will improve 02/20/2018 0754 - Progressing by Rowe Robert, RN Safety: Ability to remain free from injury will improve 02/20/2018 0754 - Progressing by Rowe Robert, RN Skin Integrity: Risk for impaired skin integrity will decrease 02/20/2018 0754 - Progressing by Rowe Robert, RN

## 2018-02-21 ENCOUNTER — Ambulatory Visit: Payer: Medicare PPO

## 2018-02-22 LAB — CULTURE, BLOOD (ROUTINE X 2)
CULTURE: NO GROWTH
Culture: NO GROWTH
SPECIAL REQUESTS: ADEQUATE
SPECIAL REQUESTS: ADEQUATE

## 2018-02-22 NOTE — Discharge Summary (Signed)
[facility] at [place] NAME: Tonya Cummings    MR#:  [id number]  DATE OF BIRTH:  04/27/1951  DATE OF ADMISSION:  02/17/2018 ADMITTING PHYSICIAN: Vaughan Basta, MD  DATE OF DISCHARGE: 02/20/2018  3:10 PM  PRIMARY CARE PHYSICIAN: [name], NP    ADMISSION DIAGNOSIS:  Influenza A [J10.1] Pneumonia of right lower lobe due to infectious organism ([place]) [J18.1]  DISCHARGE DIAGNOSIS:  Principal Problem:   Influenza A Active Problems:   Community acquired pneumonia   SECONDARY DIAGNOSIS:   Past Medical History:  Diagnosis Date  . Hypertension   . Osteoporosis   . Rheumatoid arthritis [facility])     HOSPITAL COURSE:   * influenza a- ac respi failure with hypoxia Will keep on droplet isolation, Tamiflu and symptomatic management.  till significant shortness of breath on minimal exertion, continue supplemental oxygen and try to taper.   Encouraged to use incentive spirometer.  * community-acquired pneumonia- suspected but ruled out. stop Rocephin and azithromycin for now. as procalcitonin is not high. Blood cultures are sent, and encouraged to use incentive spirometer.  * Hypertension I will hold her home medications for now.  * rheumatoid arthritis She is on medications including methotrexate and Humira, currently we will hold with active infection. She can resume as scheduled once started recovering.      DISCHARGE CONDITIONS:   Stable.  CONSULTS OBTAINED:    DRUG ALLERGIES:   Allergies  Allergen Reactions  . Ibuprofen Other (See Comments)    Headache    DISCHARGE MEDICATIONS:   Allergies as of 02/20/2018      Reactions   Ibuprofen Other (See Comments)   Headache      Medication List    STOP taking these medications   amoxicillin-clavulanate 875-125 MG tablet Commonly known as:  AUGMENTIN   erythromycin ophthalmic ointment     TAKE these medications    cyclobenzaprine 10 MG tablet Commonly known as:  FLEXERIL Take 10 mg by mouth at bedtime.   enalapril-hydrochlorothiazide 10-25 MG tablet Commonly known as:  VASERETIC Take 1 tablet by mouth daily.   FLUoxetine 40 MG capsule Commonly known as:  PROZAC Take 40 mg by mouth daily.   folic acid 1 MG tablet Commonly known as:  FOLVITE Take 1 mg by mouth daily.   guaiFENesin-dextromethorphan 100-10 MG/5ML syrup Commonly known as:  ROBITUSSIN DM Take 5 mLs by mouth every 4 (four) hours as needed for cough.   HUMIRA 40 MG/0.8ML Pskt Generic drug:  Adalimumab Inject 40 mg into the skin every 14 (fourteen) days.   hydroxychloroquine 200 MG tablet Commonly known as:  PLAQUENIL Take 200 mg by mouth 2 (two) times daily.   methotrexate 1 g injection Commonly known as:  50 mg/ml Inject 25 mg into the vein once a week.   omeprazole 20 MG capsule Commonly known as:  PRILOSEC Take 20 mg by mouth daily.   oseltamivir 30 MG capsule Commonly known as:  TAMIFLU Take 1 capsule (30 mg total) by mouth 2 (two) times daily for 2 days.   pramipexole 0.125 MG tablet Commonly known as:  MIRAPEX Take 1 tablet by mouth daily.   traZODone 50 MG tablet Commonly known as:  DESYREL Take 1 tablet by mouth at bedtime.        DISCHARGE INSTRUCTIONS:    Follow with PMD in 1-2 weeks.  If you experience worsening of your admission symptoms, develop shortness of breath, life threatening emergency, suicidal or homicidal  thoughts you must seek medical attention immediately by calling 911 or calling your MD immediately  if symptoms less severe.  You Must read complete instructions/literature along with all the possible adverse reactions/side effects for all the Medicines you take and that have been prescribed to you. Take any new Medicines after you have completely understood and accept all the possible adverse reactions/side effects.   Please note  You were cared for by a hospitalist during your  hospital stay. If you have any questions about your discharge medications or the care you received while you were in the hospital after you are discharged, you can call the unit and asked to speak with the hospitalist on call if the hospitalist that took care of you is not available. Once you are discharged, your primary care physician will handle any further medical issues. Please note that NO REFILLS for any discharge medications will be authorized once you are discharged, as it is imperative that you return to your primary care physician (or establish a relationship with a primary care physician if you do not have one) for your aftercare needs so that they can reassess your need for medications and monitor your lab values.    Today   CHIEF COMPLAINT:   Chief Complaint  Patient presents with  . Shortness of Breath    HISTORY OF PRESENT ILLNESS:  Tonya Cummings  is a 67 y.o. female with a known history of hypertension, rheumatoid arthritis on disease modifying drugs, osteoporosis- for last 4-5 days was feeling worsening cough and shortness of breath with on and off fever. She also had some pain on the right side middle and lower chest. She went to urgent care Center, they did chest x-ray and noted she is tachypneic and there was some finding of pneumonia so sent her to emergency room. In ER she is noted as mentioned above with tachypnea, was getting short of breath with minimal exertion, influenza A is positive and she is finding of infiltrate on her chest x-ray so given to admit to hospitalist team.   VITAL SIGNS:  Blood pressure (!) 141/74, pulse 96, temperature 98.8 F (37.1 C), temperature source Oral, resp. rate (!) 22, height 5\' 3"  (1.6 m), weight 92.7 kg (204 lb 5.9 oz), SpO2 97 %.  I/O:  No intake or output data in the 24 hours ending 02/22/18 1026  PHYSICAL EXAMINATION:   GENERAL:  67 y.o.-year-old patient lying in the bed with no acute distress.  EYES: Pupils equal, round,  reactive to light and accommodation. No scleral icterus. Extraocular muscles intact.  HEENT: Head atraumatic, normocephalic. Oropharynx and nasopharynx clear.  NECK:  Supple, no jugular venous distention. No thyroid enlargement, no tenderness.  LUNGS: Normal breath sounds bilaterally, no wheezing, some crepitation. No use of accessory muscles of respiration. On Oxygen supplementations. CARDIOVASCULAR: S1, S2 normal. No murmurs, rubs, or gallops.  ABDOMEN: Soft, nontender, nondistended. Bowel sounds present. No organomegaly or mass.  EXTREMITIES: No pedal edema, cyanosis, or clubbing.  NEUROLOGIC: Cranial nerves II through XII are intact. Muscle strength 5/5 in all extremities. Sensation intact. Gait not checked.  PSYCHIATRIC: The patient is alert and oriented x 3.  SKIN: No obvious rash, lesion, or ulcer.     DATA REVIEW:   CBC Recent Labs  Lab 02/18/18 0417  WBC 3.9  HGB 10.4*  HCT 31.5*  PLT 220    Chemistries  Recent Labs  Lab 02/17/18 1315 02/18/18 0417  NA 136 138  K 3.9 4.0  CL 99*  103  CO2 27 24  GLUCOSE 99 103*  BUN 16 14  CREATININE 1.12* 1.07*  CALCIUM 8.1* 7.8*  AST 131*  --   ALT 67*  --   ALKPHOS 123  --   BILITOT 0.6  --     Cardiac Enzymes No results for input(s): TROPONINI in the last 168 hours.  Microbiology Results  Results for orders placed or performed during the hospital encounter of 02/17/18  Culture, blood (routine x 2)     Status: None   Collection Time: 02/17/18 12:55 PM  Result Value Ref Range Status   Specimen Description BLOOD RIGHT ANTECUBITAL  Final   Special Requests   Final    BOTTLES DRAWN AEROBIC AND ANAEROBIC Blood Culture adequate volume   Culture   Final    NO GROWTH 5 DAYS Performed at [facility], [address], [place], [place] [address]    Report Status 02/22/2018 FINAL  Final  Culture, blood (routine x 2)     Status: None   Collection Time: 02/17/18 12:55 PM  Result Value Ref Range Status   Specimen  Description BLOOD BLOOD RIGHT HAND  Final   Special Requests   Final    BOTTLES DRAWN AEROBIC AND ANAEROBIC Blood Culture adequate volume   Culture   Final    NO GROWTH 5 DAYS Performed at [facility], [address], [place], [place] [address]    Report Status 02/22/2018 FINAL  Final    RADIOLOGY:  No results found.  EKG:   Orders placed or performed during the hospital encounter of 02/17/18  . EKG 12-Lead  . EKG 12-Lead  . EKG      Management plans discussed with the patient, family and they are in agreement.  CODE STATUS:  Code Status History    Date Active Date Inactive Code Status Order ID Comments User Context   02/17/2018 16:50 02/20/2018 18:18 Full Code [id number]  Vaughan Basta, MD Inpatient      TOTAL TIME TAKING CARE OF THIS PATIENT: 35 minutes.    Vaughan Basta M.D on 02/22/2018 at 10:26 AM  Between 7am to 6pm - Pager - [phone]  After 6pm go to [email or web address] - password [email or web address] [facility]  Office  [phone]  CC: Primary care physician; [name], NP   Note: This dictation was prepared with Dragon dictation along with smaller phrase technology. Any transcriptional errors that result from this process are unintentional.

## 2018-03-08 ENCOUNTER — Other Ambulatory Visit: Payer: Self-pay | Admitting: Internal Medicine

## 2018-03-08 DIAGNOSIS — M5416 Radiculopathy, lumbar region: Secondary | ICD-10-CM

## 2018-03-16 ENCOUNTER — Ambulatory Visit
Admission: RE | Admit: 2018-03-16 | Discharge: 2018-03-16 | Disposition: A | Discharge status: Home / Self Care | Payer: Medicare PPO | Source: Ambulatory Visit | Attending: Internal Medicine | Admitting: Internal Medicine

## 2018-03-16 DIAGNOSIS — M48061 Spinal stenosis, lumbar region without neurogenic claudication: Secondary | ICD-10-CM | POA: Insufficient documentation

## 2018-03-16 DIAGNOSIS — M5416 Radiculopathy, lumbar region: Secondary | ICD-10-CM | POA: Diagnosis present

## 2018-03-16 DIAGNOSIS — M5116 Intervertebral disc disorders with radiculopathy, lumbar region: Secondary | ICD-10-CM | POA: Diagnosis not present

## 2018-03-16 DIAGNOSIS — M4856XA Collapsed vertebra, not elsewhere classified, lumbar region, initial encounter for fracture: Secondary | ICD-10-CM | POA: Diagnosis not present

## 2018-03-16 DIAGNOSIS — M4854XA Collapsed vertebra, not elsewhere classified, thoracic region, initial encounter for fracture: Secondary | ICD-10-CM | POA: Diagnosis not present

## 2018-03-16 DIAGNOSIS — G8929 Other chronic pain: Secondary | ICD-10-CM | POA: Insufficient documentation

## 2018-03-16 DIAGNOSIS — R200 Anesthesia of skin: Secondary | ICD-10-CM | POA: Insufficient documentation

## 2018-03-16 DIAGNOSIS — R202 Paresthesia of skin: Secondary | ICD-10-CM | POA: Insufficient documentation

## 2018-03-16 DIAGNOSIS — M059 Rheumatoid arthritis with rheumatoid factor, unspecified: Secondary | ICD-10-CM | POA: Insufficient documentation

## 2018-03-22 DIAGNOSIS — G479 Sleep disorder, unspecified: Secondary | ICD-10-CM | POA: Insufficient documentation

## 2018-03-22 DIAGNOSIS — F418 Other specified anxiety disorders: Secondary | ICD-10-CM | POA: Insufficient documentation

## 2018-08-09 DIAGNOSIS — M25561 Pain in right knee: Secondary | ICD-10-CM | POA: Insufficient documentation

## 2019-09-06 NOTE — Progress Notes (Signed)
[facility]  [address] [place], [place] [address] Phone: [phone]  Fax: [phone]   Clinic Day:  09/07/2019  Referring physician: Marlowe Sax*  Chief Complaint: Tonya Cummings is a 68 y.o. female with anemia who is referred in consultation by Dr. Marlowe Sax for assessment and management.   HPI: The patient has seropositive rheumatoid arthritis and Raynaud's disease.  She was initially diagnosed in 2000.  She is currently on methotrexate SQ once a week, Plaquenil 200 mg twice daily and Humira 40 mg every other week (since 05/2016).  She previously received Morrie Sheldon (2014/2015 - 02/2016) and Enbrel 25 mg biweekly.  She takes folic acid 1 mg a day.  She has stage III chronic kidney disease.  She notes "slight anemia" for awhile.   CBCs has been followed: 08/09/2018:  hematocrit 31.2, hemoglobin 9.7, MCV 92.9, platelets 327,000, WBC 7,700.  Monocyte count 1100. Creatinine 1.3. 11/07/2018:  hematocrit 30.0, hemoglobin 9.2, MCV 93.6, platelets 362,000, WBC 8,800.  Monocyte count 1080. Creatinine 1.3. 04/30/2019:  hematocrit 28.9, hemoglobin 8.9, MCV 89.8, platelets 371,000, WBC 9,100.  Monocyte count 620. Creatinine 1.4. 05/30/2019:  hematocrit 28.4, hemoglobin 8.7, MCV 89.9, platelets 335,000, WBC 7,800.  Monocyte count 1070.  Creatinine 1.3. 08/30/2019:  hematocrit 25.7, hemoglobin 7.7, MCV 83.7, platelets 336,000, WBC 7,800.  Monocyte count 1110. Creatinine 1.3 (CrCl 41 ml/min).  Ferritin was 12 on 10/04/2013.  TSH was 2.31 with a free T4 of 1.06 on 06/28/2013.    Symptomatically, she has been feeling lightheaded for the last 3 to 4 months. She has had restless legs.  She notes indigestion, reflux, and heartburn.  She denies any new medications or herbal products.  Her methotrexate was recently decreased to 0.6 cc.  She takes Excedrin for tension headaches.  She denies any melena, hematochezia, hematuria or vaginal bleeding.   Last EGD and colonoscopy were > 10 years ago.  She took oral iron several years ago, but notes issues with constipation.   Her diet is irregular. She doesn't eat more than crackers for breakfast. She rarely eats lunch. She usually has a "regular dinner".  Within the last month, she has been craving cucumbers and shrinp. She denies ice pica.   I discussed iron treatment options with her including oral iron and IV iron. At this time, she is agreeable to get pre-authorized for IV iron in the event that she is non-tolerant to oral iron. I also discussed the possibility of transfusion, which she is agreeable to if it is needed.    Past Medical History:  Diagnosis Date  . Hypertension   . Osteoporosis   . Rheumatoid aortitis   . Rheumatoid arthritis [facility])     Past Surgical History:  Procedure Laterality Date  . CARPAL TUNNEL RELEASE Bilateral   . CHOLECYSTECTOMY    . HIP SURGERY Left   . TUBAL LIGATION      Family History  Problem Relation Age of Onset  . Diabetes Mother   . Aneurysm Father   . Diabetes Son   . Osteosarcoma Brother     Social History:  reports that she has never smoked. She has never used smokeless tobacco. She reports that she does not drink alcohol or use drugs.  She has no known exposure to chemicals or radiation. Her husband's name is Herbie Baltimore.  They have 3 children.  She has been disabled since 2015.  She previously worked in [employer].  She lives in [place] with her husband, son, and  eldest daughter.  The patient is alone today.  Allergies:  Allergies  Allergen Reactions  . Gabapentin Other (See Comments)    Weight gain  . Ibuprofen Other (See Comments)    Headache    Current Medications: Current Outpatient Medications  Medication Sig Dispense Refill  . Adalimumab (HUMIRA) 40 MG/0.8ML PSKT Inject 40 mg into the skin every 14 (fourteen) days.     . Calcium Carbonate-Vitamin D 600-400 MG-UNIT chew tablet Chew by mouth.    . Cholecalciferol 50 MCG  (2000 UT) CAPS Take 2,000 Units by mouth daily.    . cyclobenzaprine (FLEXERIL) 10 MG tablet Take 10 mg by mouth at bedtime.    . enalapril-hydrochlorothiazide (VASERETIC) 10-25 MG per tablet Take 1 tablet by mouth daily.    Marland Kitchen FLUoxetine (PROZAC) 40 MG capsule Take 40 mg by mouth daily.    . folic acid (FOLVITE) 1 MG tablet Take 1 mg by mouth daily.    . hydroxychloroquine (PLAQUENIL) 200 MG tablet Take 200 mg by mouth 2 (two) times daily.    . meclizine (ANTIVERT) 12.5 MG tablet     . methotrexate (50 MG/ML) 1 g injection Inject 25 mg into the vein once a week.     . Multiple Vitamins-Minerals (MULTIVITAMIN ADULT PO) Take by mouth.    Marland Kitchen omeprazole (PRILOSEC) 20 MG capsule Take 20 mg by mouth daily.    . pramipexole (MIRAPEX) 0.125 MG tablet Take 1 tablet by mouth daily.    . rosuvastatin (CRESTOR) 5 MG tablet Take 5 mg by mouth daily.    . traZODone (DESYREL) 50 MG tablet Take 1 tablet by mouth at bedtime.     No current facility-administered medications for this visit.     Review of Systems  Constitutional: Negative.  Negative for chills, diaphoresis, fever, malaise/fatigue and weight loss.       Feels cold all of the time.  HENT: Negative.  Negative for congestion, ear pain, nosebleeds, sinus pain and sore throat.   Eyes: Positive for blurred vision and photophobia (mild). Negative for double vision and pain.  Respiratory: Positive for shortness of breath (with exertion). Negative for cough.   Cardiovascular: Positive for chest pain (occasional, without exertion). Negative for palpitations and orthopnea.  Gastrointestinal: Positive for abdominal pain (occasional, feels secondary to taking meds w/o food) and heartburn. Negative for blood in stool, constipation, diarrhea, melena, nausea and vomiting.       Last EGD and colonoscopy > 10 years ago.  Genitourinary: Negative for dysuria, frequency, hematuria and urgency.  Musculoskeletal: Positive for falls (6-8 months ago) and joint pain  ("everywhere").  Skin: Negative.  Negative for itching and rash.       Dry.  Neurological: Positive for dizziness (3-4 months), loss of consciousness (6-8 months ago) and headaches (every other day).  Endo/Heme/Allergies: Negative.  Does not bruise/bleed easily.  Psychiatric/Behavioral: Negative for depression and memory loss. The patient has insomnia (1 year). The patient is not nervous/anxious.    Performance status (ECOG):  1  Vitals Blood pressure 134/61, pulse 82, temperature 97.9 F (36.6 C), temperature source Tympanic, resp. rate 16, height 5' 3" (1.6 m), weight 219 lb 4 oz (99.5 kg).   Physical Exam  Constitutional: She is oriented to person, place, and time. She appears well-developed and well-nourished. No distress.  She has a cane at her side.  HENT:  Head: Normocephalic and atraumatic.  Mouth/Throat: Oropharynx is clear and moist. No oropharyngeal exudate.  Dark shoulder brown hair.  Mask.  Eyes:  Pupils are equal, round, and reactive to light. Conjunctivae and EOM are normal. No scleral icterus.  Glasses.  Brown eyes.  Neck: Normal range of motion. Neck supple. No JVD present.  Cardiovascular: Normal rate, regular rhythm and normal heart sounds. Exam reveals no gallop.  No murmur heard. Pulmonary/Chest: Effort normal and breath sounds normal. No respiratory distress. She has no wheezes. She has no rales.  Abdominal: Soft. Bowel sounds are normal. She exhibits no distension and no mass. There is no abdominal tenderness. There is no rebound and no guarding.  Musculoskeletal: Normal range of motion.        General: No edema.  Lymphadenopathy:       Head (right side): No submental, no submandibular, no posterior auricular and no occipital adenopathy present.       Head (left side): No submental, no submandibular, no posterior auricular and no occipital adenopathy present.    She has no cervical adenopathy.    She has no axillary adenopathy.       Right: No inguinal and no  supraclavicular adenopathy present.       Left: No inguinal and no supraclavicular adenopathy present.  Neurological: She is alert and oriented to person, place, and time.  Skin: Skin is warm. No rash noted. She is not diaphoretic. No erythema. No pallor.  Psychiatric: She has a normal mood and affect. Her behavior is normal. Judgment and thought content normal.    No visits with results within 3 Day(s) from this visit.  Latest known visit with results is:  Admission on 02/17/2018, Discharged on 02/20/2018  Component Date Value Ref Range Status  . Specimen Description 02/17/2018 BLOOD RIGHT ANTECUBITAL   Final  . Special Requests 02/17/2018 BOTTLES DRAWN AEROBIC AND ANAEROBIC Blood Culture adequate volume   Final  . Culture 02/17/2018    Final                   Value:NO GROWTH 5 DAYS Performed at [facility] [address] [place], [place] [address]   . Report Status 02/17/2018 02/22/2018 FINAL   Final  . Specimen Description 02/17/2018 BLOOD BLOOD RIGHT HAND   Final  . Special Requests 02/17/2018 BOTTLES DRAWN AEROBIC AND ANAEROBIC Blood Culture adequate volume   Final  . Culture 02/17/2018    Final                   Value:NO GROWTH 5 DAYS Performed at [facility], [address], [place], [place] [address]   . Report Status 02/17/2018 02/22/2018 FINAL   Final  . WBC 02/17/2018 4.3  3.6 - 11.0 K/uL Final  . RBC 02/17/2018 3.86  3.80 - 5.20 MIL/uL Final  . Hemoglobin 02/17/2018 11.6* 12.0 - 16.0 g/dL Final  . HCT 02/17/2018 34.9* 35.0 - 47.0 % Final  . MCV 02/17/2018 90.3  80.0 - 100.0 fL Final  . MCH 02/17/2018 29.9  26.0 - 34.0 pg Final  . MCHC 02/17/2018 33.1  32.0 - 36.0 g/dL Final  . RDW 02/17/2018 17.0* 11.5 - 14.5 % Final  . Platelets 02/17/2018 238  150 - 440 K/uL Final  . Neutrophils Relative % 02/17/2018 59  % Final  . Neutro Abs 02/17/2018 2.5  1.4 - 6.5 K/uL Final  . Lymphocytes Relative 02/17/2018 20  % Final  . Lymphs Abs 02/17/2018 0.9* 1.0  - 3.6 K/uL Final  . Monocytes Relative 02/17/2018 21  % Final  . Monocytes Absolute 02/17/2018 0.9  0.2 - 0.9  K/uL Final  . Eosinophils Relative 02/17/2018 0  % Final  . Eosinophils Absolute 02/17/2018 0.0  0 - 0.7 K/uL Final  . Basophils Relative 02/17/2018 0  % Final  . Basophils Absolute 02/17/2018 0.0  0 - 0.1 K/uL Final   Performed at [facility], [address], [place], [place] [address]  . Lactic Acid, Venous 02/17/2018 1.3  0.5 - 1.9 mmol/L Final   Performed at [facility], [address] [place], [place] [address]  . Influenza A By PCR 02/17/2018 POSITIVE* NEGATIVE Final  . Influenza B By PCR 02/17/2018 NEGATIVE  NEGATIVE Final   Comment: (NOTE) The Xpert Xpress Flu assay is intended as an aid in the diagnosis of  influenza and should not be used as a sole basis for treatment.  This  assay is FDA approved for nasopharyngeal swab specimens only. Nasal  washings and aspirates are unacceptable for Xpert Xpress Flu testing. Performed at [facility], [address], [place], [place] [address]   . Sodium 02/17/2018 136  135 - 145 mmol/L Final  . Potassium 02/17/2018 3.9  3.5 - 5.1 mmol/L Final  . Chloride 02/17/2018 99* 101 - 111 mmol/L Final  . CO2 02/17/2018 27  22 - 32 mmol/L Final  . Glucose, Bld 02/17/2018 99  65 - 99 mg/dL Final  . BUN 02/17/2018 16  6 - 20 mg/dL Final  . Creatinine, Ser 02/17/2018 1.12* 0.44 - 1.00 mg/dL Final  . Calcium 02/17/2018 8.1* 8.9 - 10.3 mg/dL Final  . Total Protein 02/17/2018 7.3  6.5 - 8.1 g/dL Final  . Albumin 02/17/2018 3.1* 3.5 - 5.0 g/dL Final  . AST 02/17/2018 131* 15 - 41 U/L Final  . ALT 02/17/2018 67* 14 - 54 U/L Final  . Alkaline Phosphatase 02/17/2018 123  38 - 126 U/L Final  . Total Bilirubin 02/17/2018 0.6  0.3 - 1.2 mg/dL Final  . GFR calc non Af Amer 02/17/2018 50* >60 mL/min Final  . GFR calc Af Amer 02/17/2018 58* >60 mL/min Final   Comment: (NOTE) The eGFR has been calculated using the CKD  EPI equation. This calculation has not been validated in all clinical situations. eGFR's persistently <60 mL/min signify possible Chronic Kidney Disease.   Georgiann Hahn gap 02/17/2018 10  5 - 15 Final   Performed at [facility], [address] [place], [place] [address]  . Procalcitonin 02/17/2018 0.16  ng/mL Final   Comment:        Interpretation: PCT (Procalcitonin) <= 0.5 ng/mL: Systemic infection (sepsis) is not likely. Local bacterial infection is possible. (NOTE)       Sepsis PCT Algorithm           Lower Respiratory Tract                                      Infection PCT Algorithm    ----------------------------     ----------------------------         PCT < 0.25 ng/mL                PCT < 0.10 ng/mL         Strongly encourage             Strongly discourage   discontinuation of antibiotics    initiation of antibiotics    ----------------------------     -----------------------------       PCT 0.25 -  0.50 ng/mL            PCT 0.10 - 0.25 ng/mL               OR       >80% decrease in PCT            Discourage initiation of                                            antibiotics      Encourage discontinuation           of antibiotics    ----------------------------     -----------------------------         PCT >= 0.50 ng/mL              PCT 0.26 - 0.50 ng/mL               AND                                 <80% decrease in PCT             Encourage initiation of                                             antibiotics       Encourage continuation           of antibiotics    ----------------------------     -----------------------------        PCT >= 0.50 ng/mL                  PCT > 0.50 ng/mL               AND         increase in PCT                  Strongly encourage                                      initiation of antibiotics    Strongly encourage escalation           of antibiotics                                     -----------------------------                                            PCT <= 0.25 ng/mL                                                 OR                                        >  80% decrease in PCT                                     Discontinue / Do not initiate                                             antibiotics Performed at [facility], [address] [place], [place] [address]   . HIV Screen 4th Generation wRfx 02/18/2018 Non Reactive  Non Reactive Final   Comment: (NOTE) Performed At: [facility] [place], [place] [id number] Rush Farmer MD FM:[id number] Performed at [facility], [address] [place], [place] [address]   . Sodium 02/18/2018 138  135 - 145 mmol/L Final  . Potassium 02/18/2018 4.0  3.5 - 5.1 mmol/L Final  . Chloride 02/18/2018 103  101 - 111 mmol/L Final  . CO2 02/18/2018 24  22 - 32 mmol/L Final  . Glucose, Bld 02/18/2018 103* 65 - 99 mg/dL Final  . BUN 02/18/2018 14  6 - 20 mg/dL Final  . Creatinine, Ser 02/18/2018 1.07* 0.44 - 1.00 mg/dL Final  . Calcium 02/18/2018 7.8* 8.9 - 10.3 mg/dL Final  . GFR calc non Af Amer 02/18/2018 53* >60 mL/min Final  . GFR calc Af Amer 02/18/2018 >60  >60 mL/min Final   Comment: (NOTE) The eGFR has been calculated using the CKD EPI equation. This calculation has not been validated in all clinical situations. eGFR's persistently <60 mL/min signify possible Chronic Kidney Disease.   Georgiann Hahn gap 02/18/2018 11  5 - 15 Final   Performed at [facility], [address] [place], [place] [address]  . WBC 02/18/2018 3.9  3.6 - 11.0 K/uL Final  . RBC 02/18/2018 3.50* 3.80 - 5.20 MIL/uL Final  . Hemoglobin 02/18/2018 10.4* 12.0 - 16.0 g/dL Final  . HCT 02/18/2018 31.5* 35.0 - 47.0 % Final  . MCV 02/18/2018 90.0  80.0 - 100.0 fL Final  . MCH 02/18/2018 29.7  26.0 - 34.0 pg Final  . MCHC 02/18/2018 33.1  32.0 - 36.0 g/dL Final  . RDW 02/18/2018 16.8* 11.5 - 14.5 % Final  . Platelets 02/18/2018 220  150  - 440 K/uL Final   Performed at [facility], [address] [place], [place] [address]    Assessment:  Tonya Cummings is a 68 y.o. female with seropositive rheumatoid arthritis and a progressive normocytic anemia.  She was diagnosed with arthritis in 2000.  She is currently on methotrexate SQ once a week, Plaquenil 200 mg twice daily and Humira 40 mg every other week (since 05/2016).  She previously received Morrie Sheldon (2014/2015 - 02/2016) and Enbrel 25 mg biweekly.  She takes folic acid 1 mg a day.    Diet is modest.  Oral iron causes constipation.  Last EGD and colonoscopy were > 10 years ago.  She has pica (cucumbers).  CBC on 08/30/2019 revealed a hematocrit 25.7, hemoglobin 7.7, MCV 83.7, platelets 336,000, WBC 7,800.  Ferritin was 12 on 10/04/2013.  She has stage III chronic kidney disease.  Creatinine is 1.3 (CrCl 41 ml/min).  Symptomatically, she notes lightheadedness x 3-4 months, shortness of breath on exertion, reflux, and arthritis pain.  She denies any melena, hematochezia, hematuria or vaginal bleeding.  Plan: 1.  Labs today:  CBC with diff, ferritin, iron studies, sed rate, CRP, B12 folate, TSH, retic, haptoglobin, LDH. 2.   Normocytic anemia  Etiology likely multi-factorial.     Suspect iron deficiency.  Diet is modest.     Patient has reflux and possible gastritis.   Methotrexate and hydroxychloroquine possibly contributing.   Discuss work-up to assess iron stores, vitamin levels, and ensure no hemolysis.  Discuss possible transfusion for hemoglobin 7-8.  Patient would like to avoid if possible.  Discuss trial of oral iron and consideration of IV iron if unsuccessful.   Potential side effects of Venofer reviewed.   Information provided.  Consider GI evaluation given > 10 years since evaluation and symptoms of reflux/indigestion.   Patient takes an aspirin containing product (Excedrin) regularly for headaches.  3.   Preauth Venofer. 4.   RTC in 1 week for MD  assessment, review of work-up, and +/- Venofer.  I discussed the assessment and treatment plan with the patient.  The patient was provided an opportunity to ask questions and all were answered.  The patient agreed with the plan and demonstrated an understanding of the instructions.  The patient was advised to call back if the symptoms worsen or if the condition fails to improve as anticipated.  I provided 25 minutes (11:52 PM - 12:17 PM) of face-to-face time during this this encounter and > 50% was spent counseling as documented under my assessment and plan.    Melissa C. Mike Gip, MD, PhD    09/07/2019, 12:17 PM  I, Jacqualyn Posey, am acting as Education administrator for [employer]. Mike Gip, MD, PhD.  I, Melissa C. Mike Gip, MD, have reviewed the above documentation for accuracy and completeness, and I agree with the above.

## 2019-09-07 ENCOUNTER — Inpatient Hospital Stay: Payer: Medicare PPO | Attending: Hematology and Oncology | Admitting: Hematology and Oncology

## 2019-09-07 ENCOUNTER — Other Ambulatory Visit: Payer: Self-pay

## 2019-09-07 ENCOUNTER — Inpatient Hospital Stay: Payer: Medicare PPO

## 2019-09-07 ENCOUNTER — Encounter: Payer: Self-pay | Admitting: Hematology and Oncology

## 2019-09-07 VITALS — BP 134/61 | HR 82 | Temp 97.9°F | Resp 16 | Ht 63.0 in | Wt 219.2 lb

## 2019-09-07 DIAGNOSIS — R109 Unspecified abdominal pain: Secondary | ICD-10-CM | POA: Diagnosis not present

## 2019-09-07 DIAGNOSIS — Z8269 Family history of other diseases of the musculoskeletal system and connective tissue: Secondary | ICD-10-CM | POA: Diagnosis not present

## 2019-09-07 DIAGNOSIS — D631 Anemia in chronic kidney disease: Secondary | ICD-10-CM

## 2019-09-07 DIAGNOSIS — I7300 Raynaud's syndrome without gangrene: Secondary | ICD-10-CM | POA: Insufficient documentation

## 2019-09-07 DIAGNOSIS — G2581 Restless legs syndrome: Secondary | ICD-10-CM

## 2019-09-07 DIAGNOSIS — H53149 Visual discomfort, unspecified: Secondary | ICD-10-CM | POA: Diagnosis not present

## 2019-09-07 DIAGNOSIS — Z8249 Family history of ischemic heart disease and other diseases of the circulatory system: Secondary | ICD-10-CM

## 2019-09-07 DIAGNOSIS — Z833 Family history of diabetes mellitus: Secondary | ICD-10-CM | POA: Insufficient documentation

## 2019-09-07 DIAGNOSIS — R42 Dizziness and giddiness: Secondary | ICD-10-CM | POA: Insufficient documentation

## 2019-09-07 DIAGNOSIS — R079 Chest pain, unspecified: Secondary | ICD-10-CM

## 2019-09-07 DIAGNOSIS — R0602 Shortness of breath: Secondary | ICD-10-CM | POA: Insufficient documentation

## 2019-09-07 DIAGNOSIS — Z79899 Other long term (current) drug therapy: Secondary | ICD-10-CM | POA: Insufficient documentation

## 2019-09-07 DIAGNOSIS — N183 Chronic kidney disease, stage 3 (moderate): Secondary | ICD-10-CM | POA: Diagnosis not present

## 2019-09-07 DIAGNOSIS — R12 Heartburn: Secondary | ICD-10-CM | POA: Diagnosis not present

## 2019-09-07 DIAGNOSIS — H538 Other visual disturbances: Secondary | ICD-10-CM | POA: Diagnosis not present

## 2019-09-07 DIAGNOSIS — M059 Rheumatoid arthritis with rheumatoid factor, unspecified: Secondary | ICD-10-CM | POA: Diagnosis not present

## 2019-09-07 DIAGNOSIS — D649 Anemia, unspecified: Secondary | ICD-10-CM | POA: Insufficient documentation

## 2019-09-07 LAB — RETICULOCYTES
Immature Retic Fract: 17 % — ABNORMAL HIGH (ref 2.3–15.9)
RBC.: 3.18 MIL/uL — ABNORMAL LOW (ref 3.87–5.11)
Retic Count, Absolute: 60.7 10*3/uL (ref 19.0–186.0)
Retic Ct Pct: 1.9 % (ref 0.4–3.1)

## 2019-09-07 LAB — CBC WITH DIFFERENTIAL/PLATELET
Abs Immature Granulocytes: 0.03 10*3/uL (ref 0.00–0.07)
Basophils Absolute: 0 10*3/uL (ref 0.0–0.1)
Basophils Relative: 1 %
Eosinophils Absolute: 0.1 10*3/uL (ref 0.0–0.5)
Eosinophils Relative: 2 %
HCT: 26.2 % — ABNORMAL LOW (ref 36.0–46.0)
Hemoglobin: 8 g/dL — ABNORMAL LOW (ref 12.0–15.0)
Immature Granulocytes: 0 %
Lymphocytes Relative: 26 %
Lymphs Abs: 2 10*3/uL (ref 0.7–4.0)
MCH: 25.2 pg — ABNORMAL LOW (ref 26.0–34.0)
MCHC: 30.5 g/dL (ref 30.0–36.0)
MCV: 82.4 fL (ref 80.0–100.0)
Monocytes Absolute: 0.6 10*3/uL (ref 0.1–1.0)
Monocytes Relative: 8 %
Neutro Abs: 5 10*3/uL (ref 1.7–7.7)
Neutrophils Relative %: 63 %
Platelets: 351 10*3/uL (ref 150–400)
RBC: 3.18 MIL/uL — ABNORMAL LOW (ref 3.87–5.11)
RDW: 20.5 % — ABNORMAL HIGH (ref 11.5–15.5)
WBC: 7.9 10*3/uL (ref 4.0–10.5)
nRBC: 0 % (ref 0.0–0.2)

## 2019-09-07 LAB — LACTATE DEHYDROGENASE: LDH: 209 U/L — ABNORMAL HIGH (ref 98–192)

## 2019-09-07 LAB — FOLATE: Folate: >100 ng/mL (ref >5.9)

## 2019-09-07 LAB — C-REACTIVE PROTEIN: CRP: 1.1 mg/dL — ABNORMAL HIGH (ref <1.0)

## 2019-09-07 LAB — FERRITIN: Ferritin: 10 ng/mL — ABNORMAL LOW (ref 11–307)

## 2019-09-07 LAB — IRON AND TIBC
Iron: 24 ug/dL — ABNORMAL LOW (ref 28–170)
Saturation Ratios: 6 % — ABNORMAL LOW (ref 10.4–31.8)
TIBC: 428 ug/dL (ref 250–450)
UIBC: 404 ug/dL

## 2019-09-07 LAB — SEDIMENTATION RATE: Sed Rate: 70 mm/hr — ABNORMAL HIGH (ref 0–30)

## 2019-09-07 LAB — SAMPLE TO BLOOD BANK

## 2019-09-07 LAB — VITAMIN B12: Vitamin B-12: 339 pg/mL (ref 180–914)

## 2019-09-07 LAB — TSH: TSH: 1.883 u[IU]/mL (ref 0.350–4.500)

## 2019-09-07 NOTE — Patient Instructions (Signed)

## 2019-09-07 NOTE — Progress Notes (Signed)
Attempted to call to give lab results. Left VM for return call.   Faythe Casa, NP 09/07/2019 2:44 PM

## 2019-09-07 NOTE — Progress Notes (Signed)
Patient here today for a new consult for anemia.  Patient c/o dizziness, lightheaded, headaches, restless legs, fatigue, SOB with exertion and inability to sleep at night.

## 2019-09-08 LAB — HAPTOGLOBIN: Haptoglobin: 261 mg/dL (ref 37–355)

## 2019-09-11 ENCOUNTER — Encounter: Payer: Self-pay | Admitting: Hematology and Oncology

## 2019-09-11 NOTE — Progress Notes (Signed)
No new changes noted today. The Name and DOB has been verified by phone. 

## 2019-09-12 ENCOUNTER — Encounter: Payer: Self-pay | Admitting: Hematology and Oncology

## 2019-09-12 ENCOUNTER — Other Ambulatory Visit: Payer: Self-pay

## 2019-09-12 ENCOUNTER — Inpatient Hospital Stay (HOSPITAL_BASED_OUTPATIENT_CLINIC_OR_DEPARTMENT_OTHER): Payer: Medicare PPO | Admitting: Hematology and Oncology

## 2019-09-12 ENCOUNTER — Inpatient Hospital Stay: Payer: Medicare PPO

## 2019-09-12 VITALS — BP 131/66 | HR 84 | Temp 97.5°F | Resp 18 | Wt 219.2 lb

## 2019-09-12 DIAGNOSIS — N183 Chronic kidney disease, stage 3 (moderate): Secondary | ICD-10-CM | POA: Diagnosis not present

## 2019-09-12 DIAGNOSIS — E538 Deficiency of other specified B group vitamins: Secondary | ICD-10-CM

## 2019-09-12 DIAGNOSIS — D509 Iron deficiency anemia, unspecified: Secondary | ICD-10-CM | POA: Diagnosis not present

## 2019-09-12 NOTE — Progress Notes (Signed)
[facility]  [address], [address] [place], [place] [address] Phone: [phone]  Fax: [phone]   Clinic Day:  09/12/2019  Referring physician: [name], *  Chief Complaint: Tonya Cummings is a 68 y.o. female with rheumatoid arthritis and a progressive normocytic anemia who is seen for review of work-up and discussion regarding direction of therapy.  HPI: The patient was last seen in the hematology clinic on 09/07/2019 for initial consultation.  At that time, she had been feeling lightheaded for the last 3 to 4 months. She had restless legs. She noted a history of constipation with oral iron.  We discussed a trial of oral iron and if unsuccessful, IV iron.  Work-up revealed a hematocrit of 26.2, hemoglobin 8.0, MCV 82.4, platelets 351,000, white count 7900 with an ANC of 5000.  Ferritin was 10 with an iron saturation of 6% and a TIBC of 428.  CRP was 1.1.  Sed rate was 70.  Folate was > 100.  B12 was 339.  TSH was 1.883.  Haptoglobin was 261 (normal).  LDH was 209.  Retic was 1.9%.  During the interim, she hasn't tried oral iron yet.  She was called on 09/07/2019 about lab results and was under the assumption that she would be receiving IV iron today. She is not opposed to a trial of oral iron before moving towards IV iron.   Past Medical History:  Diagnosis Date   Hypertension    Osteoporosis    Rheumatoid aortitis    Rheumatoid arthritis ([facility])     Past Surgical History:  Procedure Laterality Date   CARPAL TUNNEL RELEASE Bilateral    CHOLECYSTECTOMY     HIP SURGERY Left    TUBAL LIGATION      Family History  Problem Relation Age of Onset   Diabetes Mother    Aneurysm Father    Diabetes Son    Osteosarcoma Brother     Social History:  reports that she has never smoked. She has never used smokeless tobacco. She reports that she does not drink alcohol or use drugs. She has no known exposure to chemicals or radiation.  Her husband's name is Herbie Baltimore.  They have 3 children.  She has been disabled since 2015.  She previously worked in [employer].  She lives in [place] with her husband, son, and eldest daughter. The patient is alone today.  Allergies:  Allergies  Allergen Reactions   Gabapentin Other (See Comments)    Weight gain   Ibuprofen Other (See Comments)    Headache    Current Medications: Current Outpatient Medications  Medication Sig Dispense Refill   Adalimumab (HUMIRA) 40 MG/0.8ML PSKT Inject 40 mg into the skin every 14 (fourteen) days.      Calcium Carbonate-Vitamin D 600-400 MG-UNIT chew tablet Chew by mouth.     Cholecalciferol 50 MCG (2000 UT) CAPS Take 2,000 Units by mouth daily.     cyclobenzaprine (FLEXERIL) 10 MG tablet Take 10 mg by mouth at bedtime.     enalapril-hydrochlorothiazide (VASERETIC) 10-25 MG per tablet Take 1 tablet by mouth daily.     FLUoxetine (PROZAC) 40 MG capsule Take 40 mg by mouth daily.     folic acid (FOLVITE) 1 MG tablet Take 1 mg by mouth daily.     hydroxychloroquine (PLAQUENIL) 200 MG tablet Take 200 mg by mouth 2 (two) times daily.     meclizine (ANTIVERT) 12.5 MG tablet      methotrexate (50 MG/ML) 1  g injection Inject 25 mg into the vein once a week.      Multiple Vitamins-Minerals (MULTIVITAMIN ADULT PO) Take by mouth.     omeprazole (PRILOSEC) 20 MG capsule Take 20 mg by mouth daily.     pramipexole (MIRAPEX) 0.125 MG tablet Take 1 tablet by mouth daily.     rosuvastatin (CRESTOR) 5 MG tablet Take 5 mg by mouth daily.     traZODone (DESYREL) 50 MG tablet Take 1 tablet by mouth at bedtime.     No current facility-administered medications for this visit.     Review of Systems  Constitutional: Positive for malaise/fatigue. Negative for chills, diaphoresis, fever and weight loss.       Feels "ok".  HENT: Negative.  Negative for congestion, hearing loss, sinus pain and sore throat.   Eyes: Negative.  Negative for blurred  vision, double vision, photophobia and pain.  Respiratory: Positive for shortness of breath (with exertion). Negative for cough, hemoptysis and sputum production.   Cardiovascular: Negative.  Negative for chest pain, palpitations and orthopnea.  Gastrointestinal: Positive for heartburn. Negative for abdominal pain, blood in stool, constipation, diarrhea, melena, nausea and vomiting.  Genitourinary: Negative.  Negative for dysuria, flank pain, frequency, hematuria and urgency.  Musculoskeletal: Positive for joint pain. Negative for back pain, falls, myalgias and neck pain.  Skin: Negative.  Negative for itching and rash.  Neurological: Positive for headaches (intermittent). Negative for dizziness, tingling, tremors, sensory change, speech change, focal weakness, seizures and weakness.  Endo/Heme/Allergies: Negative.  Does not bruise/bleed easily.  Psychiatric/Behavioral: Negative for depression and memory loss. The patient has insomnia. The patient is not nervous/anxious.    Performance status (ECOG): 1 - Symptomatic but completely ambulatory  Vitals Blood pressure 131/66, pulse 84, temperature (!) 97.5 F (36.4 C), temperature source Tympanic, resp. rate 18, weight 219 lb 4 oz (99.5 kg), SpO2 99 %.   Physical Exam  Constitutional: She is oriented to person, place, and time. She appears well-developed and well-nourished. No distress.  She has a cane by her side.  HENT:  Head: Normocephalic and atraumatic.  Brown hair.  Mask.  Eyes:  Glasses.  Brown eyes.  Neurological: She is alert and oriented to person, place, and time.  Skin: No rash noted. She is not diaphoretic. No erythema. No pallor.  Psychiatric: She has a normal mood and affect. Her behavior is normal. Judgment and thought content normal.    No visits with results within 3 Day(s) from this visit.  Latest known visit with results is:  Office Visit on 09/07/2019  Component Date Value Ref Range Status   LDH 09/07/2019 209* 98  - 192 U/L Final   Performed at [facility], [address], [place], [place] [address]   Haptoglobin 09/07/2019 261  37 - 355 mg/dL Final   Comment: (NOTE) Performed At: [facility], [place] [id number] Rush Farmer MD DX:[id number]    CRP 09/07/2019 1.1* <1.0 mg/dL Final   Performed at [facility], [place] [address] [place], [place] [address]   Sed Rate 09/07/2019 70* 0 - 30 mm/hr Final   Performed at [facility], [address], [place], [place] [address]   Iron 09/07/2019 24* 28 - 170 ug/dL Final   TIBC 09/07/2019 428  250 - 450 ug/dL Final   Saturation Ratios 09/07/2019 6* 10.4 - 31.8 % Final   UIBC 09/07/2019 404  ug/dL Final   Performed at [facility], [place]  [address], [place], [place] [address]   Ferritin 09/07/2019 10* 11 - 307 ng/mL Final   Performed at [facility], [address] [place], [place] [address]   Folate 09/07/2019 >100.0  >5.9 ng/mL Final   Comment: RESULTS CONFIRMED BY MANUAL DILUTION Performed at [facility], [place] [place], [place] [address]    Vitamin B-12 09/07/2019 339  180 - 914 pg/mL Final   Comment: (NOTE) This assay is not validated for testing neonatal or myeloproliferative syndrome specimens for Vitamin B12 levels. Performed at [facility], [place] [address] [place], [place] [address]    TSH 09/07/2019 1.883  0.350 - 4.500 uIU/mL Final   Comment: Performed by a 3rd Generation assay with a functional sensitivity of <=0.01 uIU/mL. Performed at [facility], [place] [place], [place] [address]    Retic Ct Pct 09/07/2019 1.9  0.4 - 3.1 % Final   RBC. 09/07/2019 3.18* 3.87 - 5.11 MIL/uL Final   Retic Count, Absolute 09/07/2019 60.7  19.0 - 186.0 K/uL Final   Immature Retic Fract 09/07/2019 17.0* 2.3 - 15.9 % Final   Performed at [facility], [place] [place], [place] [address]   WBC  09/07/2019 7.9  4.0 - 10.5 K/uL Final   RBC 09/07/2019 3.18* 3.87 - 5.11 MIL/uL Final   Hemoglobin 09/07/2019 8.0* 12.0 - 15.0 g/dL Final   HCT 09/07/2019 26.2* 36.0 - 46.0 % Final   MCV 09/07/2019 82.4  80.0 - 100.0 fL Final   MCH 09/07/2019 25.2* 26.0 - 34.0 pg Final   MCHC 09/07/2019 30.5  30.0 - 36.0 g/dL Final   RDW 09/07/2019 20.5* 11.5 - 15.5 % Final   Platelets 09/07/2019 351  150 - 400 K/uL Final   nRBC 09/07/2019 0.0  0.0 - 0.2 % Final   Neutrophils Relative % 09/07/2019 63  % Final   Neutro Abs 09/07/2019 5.0  1.7 - 7.7 K/uL Final   Lymphocytes Relative 09/07/2019 26  % Final   Lymphs Abs 09/07/2019 2.0  0.7 - 4.0 K/uL Final   Monocytes Relative 09/07/2019 8  % Final   Monocytes Absolute 09/07/2019 0.6  0.1 - 1.0 K/uL Final   Eosinophils Relative 09/07/2019 2  % Final   Eosinophils Absolute 09/07/2019 0.1  0.0 - 0.5 K/uL Final   Basophils Relative 09/07/2019 1  % Final   Basophils Absolute 09/07/2019 0.0  0.0 - 0.1 K/uL Final   Immature Granulocytes 09/07/2019 0  % Final   Abs Immature Granulocytes 09/07/2019 0.03  0.00 - 0.07 K/uL Final   Performed at [facility], [address], [place], [place] [address]   Blood Bank Specimen 09/07/2019 SAMPLE AVAILABLE FOR TESTING   Final   Sample Expiration 09/07/2019    Final                   Value:09/10/2019,2359 Performed at [facility], [address] [place], [place] [address]     Assessment:  Tonya Cummings is a 68 y.o. female with seropositive rheumatoid arthritis and a progressive normocytic anemia.  She was initially diagnosed in 2000.  She is currently on methotrexate SQ once a week, Plaquenil 200 mg twice daily and Humira 40 mg every other week (since 05/2016).  She previously received Morrie Sheldon (2014/2015 - 02/2016) and Enbrel 25 mg biweekly.  She takes folic acid 1 mg a day.    Work-up on 09/07/2019 revealed a hematocrit of 26.2, hemoglobin 8.0, MCV 82.4,  platelets  351,000, white count 7900 with an ANC of 5000.  Ferritin was 10 with an iron saturation of 6% and a TIBC of 428.  CRP and sed rate were levated.  B12 was 339 (low normal).  Normal studies included:  folate (> 100), TSH, and haptoglobin.  LDH was 209.  Retic was 1.9%.  She has stage III chronic kidney disease.  Creatinine is 1.3 (CrCl 41 ml/min).  Symptomatically, she denies any bleeding.  Exam is stable.  Plan: 1.   Review work-up on 09/07/2019. 2.   Iron deficiency anemia anemia    Hematocrit 26.2.  Hemoglobin 8.0.  MCV 82.4.  Ferritin 10 with an iron saturation of 6% and TIBC 428.  Discuss trial of oral iron.   Ferrous sulfate 325 mg po q day with OJ or vitamin C.   Increase dose as tolerated to 1 tablet BID (max dose 1 tablet TID).  If intolerant of oral iron, patient to contact clinic.  No Venofer today. 3.   B12 deficiency  B12 was low normal.  B12 goal is 400.  Begin B12 500 mcg a day.  Check B12 level in 1 month. 4.   RTC in 2 weeks (09/26/2019) for labs (CBC, retic), and +/- Venofer. 5.   RTC in 6 weeks for MD assessment, labs (CBC with diff, ferritin, B12), and +/- Venofer.  I discussed the assessment and treatment plan with the patient.  The patient was provided an opportunity to ask questions and all were answered.  The patient agreed with the plan and demonstrated an understanding of the instructions.  The patient was advised to call back if the symptoms worsen or if the condition fails to improve as anticipated.  I provided 14 minutes (10:11 AM - 10:24 AM) of face-to-face time during this this encounter and > 50% was spent counseling as documented under my assessment and plan.    Lequita Asal, MD, PhD    09/12/2019, 10:24 AM  I, Samul Dada, am acting as a scribe for Lequita Asal, MD.  I, Diller Mike Gip, MD, have reviewed the above documentation for accuracy and completeness, and I agree with the above.

## 2019-09-12 NOTE — Progress Notes (Signed)
Pt in last week states no improvement.  Still feeling weak, fatigued and lethargic at times.

## 2019-09-26 ENCOUNTER — Other Ambulatory Visit: Payer: Self-pay | Admitting: *Deleted

## 2019-09-26 ENCOUNTER — Other Ambulatory Visit: Payer: Self-pay

## 2019-09-26 ENCOUNTER — Inpatient Hospital Stay: Payer: Medicare PPO | Attending: Hematology and Oncology

## 2019-09-26 ENCOUNTER — Inpatient Hospital Stay: Payer: Medicare PPO

## 2019-09-26 DIAGNOSIS — R519 Headache, unspecified: Secondary | ICD-10-CM | POA: Insufficient documentation

## 2019-09-26 DIAGNOSIS — E538 Deficiency of other specified B group vitamins: Secondary | ICD-10-CM | POA: Diagnosis not present

## 2019-09-26 DIAGNOSIS — R42 Dizziness and giddiness: Secondary | ICD-10-CM | POA: Diagnosis not present

## 2019-09-26 DIAGNOSIS — D509 Iron deficiency anemia, unspecified: Secondary | ICD-10-CM

## 2019-09-26 DIAGNOSIS — M069 Rheumatoid arthritis, unspecified: Secondary | ICD-10-CM | POA: Diagnosis not present

## 2019-09-26 DIAGNOSIS — D649 Anemia, unspecified: Secondary | ICD-10-CM

## 2019-09-26 DIAGNOSIS — R0602 Shortness of breath: Secondary | ICD-10-CM | POA: Insufficient documentation

## 2019-09-26 DIAGNOSIS — M2550 Pain in unspecified joint: Secondary | ICD-10-CM | POA: Insufficient documentation

## 2019-09-26 DIAGNOSIS — G2581 Restless legs syndrome: Secondary | ICD-10-CM | POA: Diagnosis not present

## 2019-09-26 DIAGNOSIS — G4700 Insomnia, unspecified: Secondary | ICD-10-CM | POA: Insufficient documentation

## 2019-09-26 DIAGNOSIS — R12 Heartburn: Secondary | ICD-10-CM | POA: Diagnosis not present

## 2019-09-26 DIAGNOSIS — R5383 Other fatigue: Secondary | ICD-10-CM | POA: Diagnosis not present

## 2019-09-26 DIAGNOSIS — N183 Chronic kidney disease, stage 3 unspecified: Secondary | ICD-10-CM | POA: Insufficient documentation

## 2019-09-26 LAB — RETICULOCYTES
Immature Retic Fract: 24.9 % — ABNORMAL HIGH (ref 2.3–15.9)
RBC.: 3.5 MIL/uL — ABNORMAL LOW (ref 3.87–5.11)
Retic Count, Absolute: 98 10*3/uL (ref 19.0–186.0)
Retic Ct Pct: 2.8 % (ref 0.4–3.1)

## 2019-09-26 LAB — CBC
HCT: 28.9 % — ABNORMAL LOW (ref 36.0–46.0)
Hemoglobin: 8.8 g/dL — ABNORMAL LOW (ref 12.0–15.0)
MCH: 25.4 pg — ABNORMAL LOW (ref 26.0–34.0)
MCHC: 30.4 g/dL (ref 30.0–36.0)
MCV: 83.5 fL (ref 80.0–100.0)
Platelets: 354 10*3/uL (ref 150–400)
RBC: 3.46 MIL/uL — ABNORMAL LOW (ref 3.87–5.11)
RDW: 23.1 % — ABNORMAL HIGH (ref 11.5–15.5)
WBC: 9.5 10*3/uL (ref 4.0–10.5)
nRBC: 0 % (ref 0.0–0.2)

## 2019-09-26 LAB — IRON AND TIBC
Iron: 24 ug/dL — ABNORMAL LOW (ref 28–170)
Saturation Ratios: 6 % — ABNORMAL LOW (ref 10.4–31.8)
TIBC: 383 ug/dL (ref 250–450)
UIBC: 360 ug/dL

## 2019-09-26 LAB — FERRITIN: Ferritin: 16 ng/mL (ref 11–307)

## 2019-10-03 ENCOUNTER — Inpatient Hospital Stay: Payer: Medicare PPO

## 2019-10-03 ENCOUNTER — Other Ambulatory Visit: Payer: Self-pay | Admitting: Hematology and Oncology

## 2019-10-03 ENCOUNTER — Other Ambulatory Visit: Payer: Self-pay

## 2019-10-03 VITALS — BP 145/74 | HR 82 | Temp 98.7°F | Resp 18

## 2019-10-03 DIAGNOSIS — D649 Anemia, unspecified: Secondary | ICD-10-CM

## 2019-10-03 DIAGNOSIS — D509 Iron deficiency anemia, unspecified: Secondary | ICD-10-CM | POA: Diagnosis not present

## 2019-10-03 LAB — CBC WITH DIFFERENTIAL/PLATELET
Abs Immature Granulocytes: 0.05 10*3/uL (ref 0.00–0.07)
Basophils Absolute: 0.1 10*3/uL (ref 0.0–0.1)
Basophils Relative: 1 %
Eosinophils Absolute: 0.2 10*3/uL (ref 0.0–0.5)
Eosinophils Relative: 2 %
HCT: 27.1 % — ABNORMAL LOW (ref 36.0–46.0)
Hemoglobin: 8.3 g/dL — ABNORMAL LOW (ref 12.0–15.0)
Immature Granulocytes: 1 %
Lymphocytes Relative: 28 %
Lymphs Abs: 2.6 10*3/uL (ref 0.7–4.0)
MCH: 25.6 pg — ABNORMAL LOW (ref 26.0–34.0)
MCHC: 30.6 g/dL (ref 30.0–36.0)
MCV: 83.6 fL (ref 80.0–100.0)
Monocytes Absolute: 1 10*3/uL (ref 0.1–1.0)
Monocytes Relative: 11 %
Neutro Abs: 5.5 10*3/uL (ref 1.7–7.7)
Neutrophils Relative %: 57 %
Platelets: 338 10*3/uL (ref 150–400)
RBC: 3.24 MIL/uL — ABNORMAL LOW (ref 3.87–5.11)
RDW: 22.7 % — ABNORMAL HIGH (ref 11.5–15.5)
WBC: 9.4 10*3/uL (ref 4.0–10.5)
nRBC: 0 % (ref 0.0–0.2)

## 2019-10-03 LAB — FERRITIN: Ferritin: 19 ng/mL (ref 11–307)

## 2019-10-03 MED ORDER — SODIUM CHLORIDE 0.9 % IV SOLN: 200.0000 mg | Freq: Once | INTRAVENOUS | Status: DC

## 2019-10-03 MED ORDER — IRON SUCROSE 20 MG/ML IV SOLN
200.0000 mg | Freq: Once | INTRAVENOUS | Status: AC
  Administered 2019-10-03: 200 mg via INTRAVENOUS

## 2019-10-03 MED ORDER — SODIUM CHLORIDE 0.9 % IV SOLN
Freq: Once | INTRAVENOUS | Status: AC
  Administered 2019-10-03: 12:00:00 via INTRAVENOUS
  Filled 2019-10-03: qty 250

## 2019-10-03 NOTE — Patient Instructions (Signed)

## 2019-10-09 ENCOUNTER — Other Ambulatory Visit: Payer: Self-pay

## 2019-10-09 ENCOUNTER — Inpatient Hospital Stay: Payer: Medicare PPO

## 2019-10-09 VITALS — BP 117/76 | HR 73 | Temp 97.1°F | Resp 18

## 2019-10-09 DIAGNOSIS — D649 Anemia, unspecified: Secondary | ICD-10-CM

## 2019-10-09 DIAGNOSIS — D509 Iron deficiency anemia, unspecified: Secondary | ICD-10-CM | POA: Diagnosis not present

## 2019-10-09 MED ORDER — SODIUM CHLORIDE 0.9 % IV SOLN: 200.0000 mg | Freq: Once | INTRAVENOUS | Status: DC

## 2019-10-09 MED ORDER — SODIUM CHLORIDE 0.9 % IV SOLN
Freq: Once | INTRAVENOUS | Status: AC
  Administered 2019-10-09: 14:00:00 via INTRAVENOUS
  Filled 2019-10-09: qty 250

## 2019-10-09 MED ORDER — IRON SUCROSE 20 MG/ML IV SOLN
200.0000 mg | Freq: Once | INTRAVENOUS | Status: AC
  Administered 2019-10-09: 200 mg via INTRAVENOUS
  Filled 2019-10-09: qty 10

## 2019-10-09 NOTE — Patient Instructions (Signed)

## 2019-10-15 ENCOUNTER — Other Ambulatory Visit: Payer: Self-pay

## 2019-10-16 ENCOUNTER — Inpatient Hospital Stay: Payer: Medicare PPO | Attending: Hematology and Oncology

## 2019-10-16 VITALS — BP 130/81 | HR 76 | Temp 98.1°F | Resp 17

## 2019-10-16 DIAGNOSIS — M25562 Pain in left knee: Secondary | ICD-10-CM | POA: Diagnosis not present

## 2019-10-16 DIAGNOSIS — R0602 Shortness of breath: Secondary | ICD-10-CM | POA: Insufficient documentation

## 2019-10-16 DIAGNOSIS — Z888 Allergy status to other drugs, medicaments and biological substances status: Secondary | ICD-10-CM | POA: Diagnosis not present

## 2019-10-16 DIAGNOSIS — Z8249 Family history of ischemic heart disease and other diseases of the circulatory system: Secondary | ICD-10-CM | POA: Diagnosis not present

## 2019-10-16 DIAGNOSIS — R12 Heartburn: Secondary | ICD-10-CM | POA: Insufficient documentation

## 2019-10-16 DIAGNOSIS — Z886 Allergy status to analgesic agent status: Secondary | ICD-10-CM | POA: Insufficient documentation

## 2019-10-16 DIAGNOSIS — H53149 Visual discomfort, unspecified: Secondary | ICD-10-CM | POA: Insufficient documentation

## 2019-10-16 DIAGNOSIS — I129 Hypertensive chronic kidney disease with stage 1 through stage 4 chronic kidney disease, or unspecified chronic kidney disease: Secondary | ICD-10-CM | POA: Insufficient documentation

## 2019-10-16 DIAGNOSIS — Z79899 Other long term (current) drug therapy: Secondary | ICD-10-CM | POA: Diagnosis not present

## 2019-10-16 DIAGNOSIS — D509 Iron deficiency anemia, unspecified: Secondary | ICD-10-CM | POA: Insufficient documentation

## 2019-10-16 DIAGNOSIS — Z808 Family history of malignant neoplasm of other organs or systems: Secondary | ICD-10-CM | POA: Diagnosis not present

## 2019-10-16 DIAGNOSIS — M25541 Pain in joints of right hand: Secondary | ICD-10-CM | POA: Insufficient documentation

## 2019-10-16 DIAGNOSIS — M1990 Unspecified osteoarthritis, unspecified site: Secondary | ICD-10-CM | POA: Diagnosis not present

## 2019-10-16 DIAGNOSIS — M25542 Pain in joints of left hand: Secondary | ICD-10-CM | POA: Insufficient documentation

## 2019-10-16 DIAGNOSIS — E538 Deficiency of other specified B group vitamins: Secondary | ICD-10-CM | POA: Insufficient documentation

## 2019-10-16 DIAGNOSIS — D649 Anemia, unspecified: Secondary | ICD-10-CM

## 2019-10-16 DIAGNOSIS — M069 Rheumatoid arthritis, unspecified: Secondary | ICD-10-CM | POA: Diagnosis not present

## 2019-10-16 DIAGNOSIS — N183 Chronic kidney disease, stage 3 unspecified: Secondary | ICD-10-CM | POA: Diagnosis not present

## 2019-10-16 DIAGNOSIS — Z823 Family history of stroke: Secondary | ICD-10-CM | POA: Insufficient documentation

## 2019-10-16 DIAGNOSIS — M25561 Pain in right knee: Secondary | ICD-10-CM | POA: Diagnosis not present

## 2019-10-16 DIAGNOSIS — R519 Headache, unspecified: Secondary | ICD-10-CM | POA: Insufficient documentation

## 2019-10-16 MED ORDER — SODIUM CHLORIDE 0.9 % IV SOLN
Freq: Once | INTRAVENOUS | Status: AC
  Administered 2019-10-16: 14:00:00 via INTRAVENOUS
  Filled 2019-10-16: qty 250

## 2019-10-16 MED ORDER — IRON SUCROSE 20 MG/ML IV SOLN
INTRAVENOUS | Status: AC
  Filled 2019-10-16: qty 10

## 2019-10-16 MED ORDER — SODIUM CHLORIDE 0.9 % IV SOLN: 200.0000 mg | Freq: Once | INTRAVENOUS | Status: DC

## 2019-10-16 MED ORDER — IRON SUCROSE 20 MG/ML IV SOLN
200.0000 mg | Freq: Once | INTRAVENOUS | Status: AC
  Administered 2019-10-16: 200 mg via INTRAVENOUS

## 2019-10-16 NOTE — Patient Instructions (Signed)

## 2019-10-17 ENCOUNTER — Other Ambulatory Visit: Payer: Self-pay

## 2019-10-17 DIAGNOSIS — D509 Iron deficiency anemia, unspecified: Secondary | ICD-10-CM

## 2019-10-17 DIAGNOSIS — E538 Deficiency of other specified B group vitamins: Secondary | ICD-10-CM

## 2019-10-22 NOTE — Progress Notes (Signed)
[facility]  [address], [address] [place], [place] [address] Phone: [phone]  Fax: [phone]   Clinic Day:  10/24/2019  Referring physician: [name], *  Chief Complaint: Tonya Cummings is a 68 y.o. female with rheumatoid arthritis and a progressive normocytic anemia who is seen for 2 month assessment.  HPI: The patient was last seen in the hematology clinic on 09/12/2019. At that time, she denied any bleeding. Exam was stable. Hematocrit 26.2, hemoglobin 8.0, MCV 82.4. Ferritin was 10 with an iron saturation of 6% and TIBC of 428. Patient did not receive any Venofer. I discussed a trial of oral iron. B12 was 339.  She was to start B-12 500 mcg a day.   Labs followed: 09/26/2019: Hematocrit 28.9, hemoglobin 8.8, MCV 83.5, platelets 354,000, WBC 9,500. Ferritin 16 with an iron saturation of 6% and a TIBC of 383. Retic 2.8%. 10/03/2019: Hematocrit 27.1, hemoglobin 8.3, MCV 83.6, platelets 338,000, WBC 9,400. Ferritin 19.  10/23/2019: Hematocrit 30.6, hemoglobin 9.5, MCV 86.4, platelets 292,000, WBC 8,100. Ferritin 191. Vitamin B-12 604.  She received Venofer weekly x 3 (10/03/2019 - 10/16/2019)  During the interim, she has felt good. She has bilateral hand and knee pain (5/10) secondary to arthritis. She notes mild improvement with IV iron, because she still "drags" during the day. She has shortness of breath. She has occasional heartburn on omeprazole. She has less frequent headaches. She is still has trouble sleeping. Most nights, she only gets a couple hours of sleep. She no longer craves cucumbers. She is eating iron rich foods.    Past Medical History:  Diagnosis Date  . Hypertension   . Osteoporosis   . Rheumatoid aortitis   . Rheumatoid arthritis [facility])     Past Surgical History:  Procedure Laterality Date  . CARPAL TUNNEL RELEASE Bilateral   . CHOLECYSTECTOMY    . HIP SURGERY Left   . TUBAL LIGATION      Family History   Problem Relation Age of Onset  . Diabetes Mother   . Aneurysm Father   . Diabetes Son   . Osteosarcoma Brother     Social History:  reports that she has never smoked. She has never used smokeless tobacco. She reports that she does not drink alcohol or use drugs. She has no known exposure to chemicals or radiation.Her husband's name is Herbie Baltimore. They have 3 children. She has been disabled since 2015. She previously worked in [employer]. She lives in [place] her husband, son, and eldest daughter. The patient is alone today.  Allergies:  Allergies  Allergen Reactions  . Gabapentin Other (See Comments)    Weight gain  . Ibuprofen Other (See Comments)    Headache    Current Medications: Current Outpatient Medications  Medication Sig Dispense Refill  . Adalimumab (HUMIRA) 40 MG/0.8ML PSKT Inject 40 mg into the skin every 14 (fourteen) days.     . Calcium Carbonate-Vitamin D 600-400 MG-UNIT chew tablet Chew by mouth.    . Cholecalciferol 50 MCG (2000 UT) CAPS Take 2,000 Units by mouth daily.    . cyclobenzaprine (FLEXERIL) 10 MG tablet Take 10 mg by mouth at bedtime.    . enalapril-hydrochlorothiazide (VASERETIC) 10-25 MG per tablet Take 1 tablet by mouth daily.    Marland Kitchen FLUoxetine (PROZAC) 40 MG capsule Take 40 mg by mouth daily.    . folic acid (FOLVITE) 1 MG tablet Take 1 mg by mouth daily.    . hydroxychloroquine (PLAQUENIL) 200 MG tablet  Take 200 mg by mouth 2 (two) times daily.    . methotrexate (50 MG/ML) 1 g injection Inject 25 mg into the vein once a week.     . Multiple Vitamins-Minerals (MULTIVITAMIN ADULT PO) Take by mouth.    Marland Kitchen omeprazole (PRILOSEC) 20 MG capsule Take 20 mg by mouth daily.    . pramipexole (MIRAPEX) 0.125 MG tablet Take 1 tablet by mouth daily.    . rosuvastatin (CRESTOR) 5 MG tablet Take 5 mg by mouth daily.    . traZODone (DESYREL) 50 MG tablet Take 1 tablet by mouth at bedtime.    . meclizine (ANTIVERT) 12.5 MG tablet      No current  facility-administered medications for this visit.     Review of Systems  Constitutional: Positive for weight loss (2 pounds). Negative for chills, diaphoresis, fever and malaise/fatigue.       Feels "good".  HENT: Negative.  Negative for congestion, ear pain, hearing loss, nosebleeds, sinus pain and sore throat.   Eyes: Positive for photophobia. Negative for blurred vision and double vision.  Respiratory: Positive for shortness of breath (with exertion). Negative for cough, hemoptysis and sputum production.   Cardiovascular: Negative.  Negative for chest pain, palpitations and orthopnea.  Gastrointestinal: Positive for heartburn (on omeprazole). Negative for abdominal pain, blood in stool, constipation, diarrhea, melena, nausea and vomiting.       Due for colonoscopy- polyps in the past.  Genitourinary: Negative.  Negative for dysuria, flank pain, frequency, hematuria and urgency.  Musculoskeletal: Positive for joint pain (arthritis; bilateral hand and knee pain; (5/10)). Negative for back pain, falls, myalgias and neck pain.  Skin: Negative.  Negative for itching and rash.  Neurological: Positive for headaches (less frequent). Negative for dizziness, tingling, tremors, sensory change, speech change, focal weakness, seizures and weakness.  Endo/Heme/Allergies: Does not bruise/bleed easily.  Psychiatric/Behavioral: Negative for depression and memory loss. The patient has insomnia. The patient is not nervous/anxious.   All other systems reviewed and are negative.  Performance status (ECOG): 1  Vitals Blood pressure 106/63, pulse 84, temperature 98.6 F (37 C), temperature source Tympanic, resp. rate 18, height 5\' 3"  (1.6 m), weight 217 lb 4.2 oz (98.5 kg), SpO2 100 %.  Physical Exam  Constitutional: She is oriented to person, place, and time. She appears well-developed and well-nourished. No distress.  She has a cane by her side.  HENT:  Head: Normocephalic and atraumatic.  Mouth/Throat:  Oropharynx is clear and moist. No oropharyngeal exudate.  Shoulder length brown hair.  Dry mouth.  Mask.  Eyes: Pupils are equal, round, and reactive to light. Conjunctivae and EOM are normal. No scleral icterus.  Glasses.  Brown eyes. Sensitive to light.  Neck: Normal range of motion. Neck supple. No JVD present.  Cardiovascular: Normal rate, regular rhythm and normal heart sounds.  No murmur heard. Pulmonary/Chest: Effort normal and breath sounds normal. No respiratory distress. She has no wheezes. She has no rales. She exhibits no tenderness.  Abdominal: Soft. Bowel sounds are normal. She exhibits no distension and no mass. There is no abdominal tenderness. There is no rebound and no guarding.  Musculoskeletal: Normal range of motion.        General: No tenderness or edema.  Lymphadenopathy:       Head (right side): No preauricular, no posterior auricular and no occipital adenopathy present.       Head (left side): No preauricular, no posterior auricular and no occipital adenopathy present.    She has no cervical  adenopathy.    She has no axillary adenopathy.       Right: No inguinal and no supraclavicular adenopathy present.       Left: No inguinal and no supraclavicular adenopathy present.  Neurological: She is alert and oriented to person, place, and time.  Skin: Skin is warm and dry. No rash noted. She is not diaphoretic. No erythema. No pallor.  Psychiatric: She has a normal mood and affect. Her behavior is normal. Judgment and thought content normal.  Vitals reviewed.   Appointment on 10/23/2019  Component Date Value Ref Range Status  . Vitamin B-12 10/23/2019 604  180 - 914 pg/mL Final   Comment: (NOTE) This assay is not validated for testing neonatal or myeloproliferative syndrome specimens for Vitamin B12 levels. Performed at [facility], [place] [address] [place], [place] [address]   . Ferritin 10/23/2019 191  11 - 307 ng/mL Final   Performed at [facility], [address] [place], [place] [address]  . WBC 10/23/2019 8.1  4.0 - 10.5 K/uL Final  . RBC 10/23/2019 3.54* 3.87 - 5.11 MIL/uL Final  . Hemoglobin 10/23/2019 9.5* 12.0 - 15.0 g/dL Final  . HCT 10/23/2019 30.6* 36.0 - 46.0 % Final  . MCV 10/23/2019 86.4  80.0 - 100.0 fL Final  . MCH 10/23/2019 26.8  26.0 - 34.0 pg Final  . MCHC 10/23/2019 31.0  30.0 - 36.0 g/dL Final  . RDW 10/23/2019 23.2* 11.5 - 15.5 % Final  . Platelets 10/23/2019 292  150 - 400 K/uL Final  . nRBC 10/23/2019 0.0  0.0 - 0.2 % Final  . Neutrophils Relative % 10/23/2019 59  % Final  . Neutro Abs 10/23/2019 4.7  1.7 - 7.7 K/uL Final  . Lymphocytes Relative 10/23/2019 30  % Final  . Lymphs Abs 10/23/2019 2.4  0.7 - 4.0 K/uL Final  . Monocytes Relative 10/23/2019 9  % Final  . Monocytes Absolute 10/23/2019 0.8  0.1 - 1.0 K/uL Final  . Eosinophils Relative 10/23/2019 1  % Final  . Eosinophils Absolute 10/23/2019 0.1  0.0 - 0.5 K/uL Final  . Basophils Relative 10/23/2019 1  % Final  . Basophils Absolute 10/23/2019 0.0  0.0 - 0.1 K/uL Final  . Immature Granulocytes 10/23/2019 0  % Final  . Abs Immature Granulocytes 10/23/2019 0.03  0.00 - 0.07 K/uL Final   Performed at [facility], [address] [place], [place] [address]    Assessment:  Tonya Cummings is a 68 y.o. female withseropositive rheumatoid arthritisand a progressive normocytic anemia. She was initially diagnosed in 2000. She is currently on methotrexate SQ oncea week, Plaquenil 200 mg twice daily and Humira 40 mgeveryother week (since 05/2016). She previously received Morrie Sheldon (2014/2015-02/2016) and Enbrel 25 mg biweekly. Shetakes folic acid1 mg a day.   Work-up on 09/07/2019 revealed a hematocrit of 26.2, hemoglobin 8.0, MCV 82.4, platelets 351,000, white count 7900 with an ANC of 5000.  Ferritin was 10 with an iron saturation of 6% and a TIBC of 428.  CRP and sed rate were levated.  B12 was 339 (low normal).  Normal studies  included:  folate (> 100), TSH, and haptoglobin.  LDH was 209.  Retic was 1.9%.  She has iron deficiency.  She received Venofer weekly x 3 (10/03/2019 - 10/16/2019).  Ferritin has been followed:  10 on 09/07/2019, 16 on 09/26/2019, 19 on 10/03/2019, and 191 on 10/23/2019.  She has B12 deficiency.  B12 was 339 on 09/07/2019.  She is on oral B12 500 mcg a day.  B12 was 604 on 10/23/2019.  She has stage III chronic kidney disease.Creatinine is 1.3 (CrCl 41 ml/min).  Symptomatically, she is feeling a little better s/p IV iron.  Cucumber pica has resolved.  She is eating iron rich foods.  Exam is stable.  Plan: 1.   Review labs from 10/23/2019. 2. Iron deficiency anemia           Hematocrit 26.2.  Hemoglobin 8.0.  MCV 82.4 on 09/07/2019.              Ferritin 10 with an iron saturation of 6% and TIBC 428.   Patient received IV iron x 3.             Hematocrit 30.6.  Hemoglobin 9.5.  MCV 86.4 on 10/23/2019.   Ferritin 191 on 10/23/2019.   Urinalysis to r/o hematuria.  Discuss plan for short term follow-up of labs and iron stores.  Suspect some component of anemia of chronic disease. 3.   B12 deficiency             B12 was 339 on 09/07/2019.             B12 goal is 400.             She is on B12 500 mcg a day.             B12 level is 604 today. 4.   RTC in 2 months for labs (CBC, ferritin). 5.   RTC in 4 months for MD assessment, labs (CBC with differential, ferritin, iron studies- day before) and +/- Venofer.  I discussed the assessment and treatment plan with the patient.  The patient was provided an opportunity to ask questions and all were answered.  The patient agreed with the plan and demonstrated an understanding of the instructions.  The patient was advised to call back if the symptoms worsen or if the condition fails to improve as anticipated.  I provided 20 minutes of face-to-face time during this this encounter and > 50% was spent counseling as documented under my  assessment and plan.    Lequita Asal, MD, PhD    10/24/2019, 2:09 PM  I, Selena Batten, am acting as scribe for [employer]. Mike Gip, MD, PhD.  I, Melissa C. Mike Gip, MD, have reviewed the above documentation for accuracy and completeness, and I agree with the above.

## 2019-10-23 ENCOUNTER — Other Ambulatory Visit: Payer: Self-pay

## 2019-10-23 ENCOUNTER — Inpatient Hospital Stay: Payer: Medicare PPO

## 2019-10-23 DIAGNOSIS — D509 Iron deficiency anemia, unspecified: Secondary | ICD-10-CM

## 2019-10-23 DIAGNOSIS — E538 Deficiency of other specified B group vitamins: Secondary | ICD-10-CM

## 2019-10-23 LAB — CBC WITH DIFFERENTIAL/PLATELET
Abs Immature Granulocytes: 0.03 10*3/uL (ref 0.00–0.07)
Basophils Absolute: 0 10*3/uL (ref 0.0–0.1)
Basophils Relative: 1 %
Eosinophils Absolute: 0.1 10*3/uL (ref 0.0–0.5)
Eosinophils Relative: 1 %
HCT: 30.6 % — ABNORMAL LOW (ref 36.0–46.0)
Hemoglobin: 9.5 g/dL — ABNORMAL LOW (ref 12.0–15.0)
Immature Granulocytes: 0 %
Lymphocytes Relative: 30 %
Lymphs Abs: 2.4 10*3/uL (ref 0.7–4.0)
MCH: 26.8 pg (ref 26.0–34.0)
MCHC: 31 g/dL (ref 30.0–36.0)
MCV: 86.4 fL (ref 80.0–100.0)
Monocytes Absolute: 0.8 10*3/uL (ref 0.1–1.0)
Monocytes Relative: 9 %
Neutro Abs: 4.7 10*3/uL (ref 1.7–7.7)
Neutrophils Relative %: 59 %
Platelets: 292 10*3/uL (ref 150–400)
RBC: 3.54 MIL/uL — ABNORMAL LOW (ref 3.87–5.11)
RDW: 23.2 % — ABNORMAL HIGH (ref 11.5–15.5)
WBC: 8.1 10*3/uL (ref 4.0–10.5)
nRBC: 0 % (ref 0.0–0.2)

## 2019-10-23 LAB — FERRITIN: Ferritin: 191 ng/mL (ref 11–307)

## 2019-10-23 LAB — VITAMIN B12: Vitamin B-12: 604 pg/mL (ref 180–914)

## 2019-10-24 ENCOUNTER — Inpatient Hospital Stay: Payer: Medicare PPO | Admitting: Hematology and Oncology

## 2019-10-24 ENCOUNTER — Inpatient Hospital Stay: Payer: Medicare PPO

## 2019-10-24 ENCOUNTER — Encounter: Payer: Self-pay | Admitting: Hematology and Oncology

## 2019-10-24 VITALS — BP 106/63 | HR 84 | Temp 98.6°F | Resp 18 | Ht 63.0 in | Wt 217.3 lb

## 2019-10-24 DIAGNOSIS — D509 Iron deficiency anemia, unspecified: Secondary | ICD-10-CM

## 2019-10-24 DIAGNOSIS — E538 Deficiency of other specified B group vitamins: Secondary | ICD-10-CM

## 2019-10-24 LAB — URINALYSIS, COMPLETE (UACMP) WITH MICROSCOPIC
Bilirubin Urine: NEGATIVE
Glucose, UA: NEGATIVE mg/dL
Hgb urine dipstick: NEGATIVE
Ketones, ur: NEGATIVE mg/dL
Nitrite: NEGATIVE
Protein, ur: NEGATIVE mg/dL
RBC / HPF: NONE SEEN RBC/hpf (ref 0–5)
Specific Gravity, Urine: 1.025 (ref 1.005–1.030)
pH: 5 (ref 5.0–8.0)

## 2019-10-24 NOTE — Progress Notes (Signed)
Patient c/o bilateral hand and leg pain ( 5)

## 2019-12-24 ENCOUNTER — Inpatient Hospital Stay: Payer: Medicare HMO | Attending: Hematology and Oncology

## 2019-12-24 ENCOUNTER — Other Ambulatory Visit: Payer: Self-pay

## 2019-12-24 DIAGNOSIS — D509 Iron deficiency anemia, unspecified: Secondary | ICD-10-CM | POA: Diagnosis not present

## 2019-12-24 LAB — CBC
HCT: 30.3 % — ABNORMAL LOW (ref 36.0–46.0)
Hemoglobin: 9.5 g/dL — ABNORMAL LOW (ref 12.0–15.0)
MCH: 28 pg (ref 26.0–34.0)
MCHC: 31.4 g/dL (ref 30.0–36.0)
MCV: 89.4 fL (ref 80.0–100.0)
Platelets: 287 10*3/uL (ref 150–400)
RBC: 3.39 MIL/uL — ABNORMAL LOW (ref 3.87–5.11)
RDW: 17 % — ABNORMAL HIGH (ref 11.5–15.5)
WBC: 8.6 10*3/uL (ref 4.0–10.5)
nRBC: 0 % (ref 0.0–0.2)

## 2019-12-24 LAB — FERRITIN: Ferritin: 40 ng/mL (ref 11–307)

## 2020-01-24 DIAGNOSIS — Z9989 Dependence on other enabling machines and devices: Secondary | ICD-10-CM | POA: Diagnosis not present

## 2020-01-24 DIAGNOSIS — M1711 Unilateral primary osteoarthritis, right knee: Secondary | ICD-10-CM | POA: Diagnosis not present

## 2020-01-24 DIAGNOSIS — Z1211 Encounter for screening for malignant neoplasm of colon: Secondary | ICD-10-CM | POA: Diagnosis not present

## 2020-01-28 DIAGNOSIS — M25561 Pain in right knee: Secondary | ICD-10-CM | POA: Diagnosis not present

## 2020-01-28 DIAGNOSIS — M1711 Unilateral primary osteoarthritis, right knee: Secondary | ICD-10-CM | POA: Diagnosis not present

## 2020-01-28 DIAGNOSIS — G8929 Other chronic pain: Secondary | ICD-10-CM | POA: Diagnosis not present

## 2020-02-26 ENCOUNTER — Inpatient Hospital Stay: Payer: Medicare HMO | Attending: Hematology and Oncology

## 2020-02-26 ENCOUNTER — Other Ambulatory Visit: Payer: Self-pay

## 2020-02-26 DIAGNOSIS — D509 Iron deficiency anemia, unspecified: Secondary | ICD-10-CM | POA: Diagnosis not present

## 2020-02-26 DIAGNOSIS — R519 Headache, unspecified: Secondary | ICD-10-CM | POA: Diagnosis not present

## 2020-02-26 DIAGNOSIS — G2581 Restless legs syndrome: Secondary | ICD-10-CM | POA: Insufficient documentation

## 2020-02-26 DIAGNOSIS — Z886 Allergy status to analgesic agent status: Secondary | ICD-10-CM | POA: Insufficient documentation

## 2020-02-26 DIAGNOSIS — R0602 Shortness of breath: Secondary | ICD-10-CM | POA: Diagnosis not present

## 2020-02-26 DIAGNOSIS — K219 Gastro-esophageal reflux disease without esophagitis: Secondary | ICD-10-CM | POA: Insufficient documentation

## 2020-02-26 DIAGNOSIS — R5383 Other fatigue: Secondary | ICD-10-CM | POA: Diagnosis not present

## 2020-02-26 DIAGNOSIS — Z8249 Family history of ischemic heart disease and other diseases of the circulatory system: Secondary | ICD-10-CM | POA: Insufficient documentation

## 2020-02-26 DIAGNOSIS — Z79899 Other long term (current) drug therapy: Secondary | ICD-10-CM | POA: Diagnosis not present

## 2020-02-26 DIAGNOSIS — M069 Rheumatoid arthritis, unspecified: Secondary | ICD-10-CM | POA: Diagnosis not present

## 2020-02-26 DIAGNOSIS — Z808 Family history of malignant neoplasm of other organs or systems: Secondary | ICD-10-CM | POA: Diagnosis not present

## 2020-02-26 DIAGNOSIS — E538 Deficiency of other specified B group vitamins: Secondary | ICD-10-CM | POA: Diagnosis not present

## 2020-02-26 DIAGNOSIS — N183 Chronic kidney disease, stage 3 unspecified: Secondary | ICD-10-CM | POA: Diagnosis not present

## 2020-02-26 DIAGNOSIS — Z888 Allergy status to other drugs, medicaments and biological substances status: Secondary | ICD-10-CM | POA: Diagnosis not present

## 2020-02-26 DIAGNOSIS — K30 Functional dyspepsia: Secondary | ICD-10-CM | POA: Diagnosis not present

## 2020-02-26 DIAGNOSIS — Z833 Family history of diabetes mellitus: Secondary | ICD-10-CM | POA: Diagnosis not present

## 2020-02-26 LAB — CBC WITH DIFFERENTIAL/PLATELET
Abs Immature Granulocytes: 0.05 10*3/uL (ref 0.00–0.07)
Basophils Absolute: 0.1 10*3/uL (ref 0.0–0.1)
Basophils Relative: 1 %
Eosinophils Absolute: 0.2 10*3/uL (ref 0.0–0.5)
Eosinophils Relative: 2 %
HCT: 30.7 % — ABNORMAL LOW (ref 36.0–46.0)
Hemoglobin: 9.5 g/dL — ABNORMAL LOW (ref 12.0–15.0)
Immature Granulocytes: 1 %
Lymphocytes Relative: 23 %
Lymphs Abs: 2.5 10*3/uL (ref 0.7–4.0)
MCH: 27.8 pg (ref 26.0–34.0)
MCHC: 30.9 g/dL (ref 30.0–36.0)
MCV: 89.8 fL (ref 80.0–100.0)
Monocytes Absolute: 1.3 10*3/uL — ABNORMAL HIGH (ref 0.1–1.0)
Monocytes Relative: 12 %
Neutro Abs: 6.8 10*3/uL (ref 1.7–7.7)
Neutrophils Relative %: 61 %
Platelets: 374 10*3/uL (ref 150–400)
RBC: 3.42 MIL/uL — ABNORMAL LOW (ref 3.87–5.11)
RDW: 16.5 % — ABNORMAL HIGH (ref 11.5–15.5)
WBC: 10.9 10*3/uL — ABNORMAL HIGH (ref 4.0–10.5)
nRBC: 0 % (ref 0.0–0.2)

## 2020-02-26 LAB — IRON AND TIBC
Iron: 15 ug/dL — ABNORMAL LOW (ref 28–170)
Saturation Ratios: 4 % — ABNORMAL LOW (ref 10.4–31.8)
TIBC: 378 ug/dL (ref 250–450)
UIBC: 363 ug/dL

## 2020-02-26 LAB — FERRITIN: Ferritin: 14 ng/mL (ref 11–307)

## 2020-02-26 NOTE — Progress Notes (Signed)
[facility] [place] [facility]  [address], [address] [place], [place] [address] Phone: [phone]  Fax: [phone]   Clinic Day:  02/27/2020  Referring physician: [name], *  Chief Complaint: Tonya Cummings is a 69 y.o. female with rheumatoid arthritis and iron deficiency anemia who is seen for 4 month assessment.  HPI: The patient was last seen in the hematology clinic on 10/24/2019. At that time, she was feeling a little better s/p IV iron.  Cucumber pica had resolved.  She was eating iron rich foods.  Exam was stable.  Labs followed: 09/26/2019:  Hematocrit 28.9, hemoglobin 8.8, MCV 83.5, platelets 354,000, WBC 9,500. Ferritin 16 with an iron saturation of 6% and a TIBC of 383. Retic 2.8%. 10/03/2019:  Hematocrit 27.1, hemoglobin 8.3, MCV 83.6, platelets 338,000, WBC 9,400. Ferritin 19.  10/23/2019:  Hematocrit 30.6, hemoglobin 9.5, MCV 86.4, platelets 292,000, WBC 8,100. Ferritin 191. B12 was 604. 12/24/2019:  Hematocrit 30.3, hemoglobin 9.5, MCV 89.4, platelets 287,000, WBC 8,600, Ferritin 40. 02/26/2020:  Hematocrit 30.7, hemoglobin 9.5, MCV 89.8, platelets 374,000, WBC 10,900, Ferritin 14 with an iron saturation of 4% and a TIBC of 378.   She received Venofer weekly x 3 (10/03/2019 - 10/16/2019).  During the interim, she has been well. She has been having some issues with her right knee due to arthritis; she received an injection. She has been feeling more fatigued. She has been having restless legs.  She denies any pica.  She feels that her iron is low again.  She denies any bleeding except for hemorrhoidal bleeding at times.  Diet is described as "so-so".  She has occasional reflux and indigestion.  Last EGD was "a while ago".  She is unaware of her last colonoscopy.  Colonoscopies have been performed at [facility], [place] [facility] and [place].   Past Medical History:  Diagnosis Date  . Hypertension   . Osteoporosis   . Rheumatoid aortitis   . Rheumatoid  arthritis [facility])     Past Surgical History:  Procedure Laterality Date  . CARPAL TUNNEL RELEASE Bilateral   . CHOLECYSTECTOMY    . HIP SURGERY Left   . TUBAL LIGATION      Family History  Problem Relation Age of Onset  . Diabetes Mother   . Aneurysm Father   . Diabetes Son   . Osteosarcoma Brother     Social History:  reports that she has never smoked. She has never used smokeless tobacco. She reports that she does not drink alcohol or use drugs. She has no known exposure to chemicals or radiation.Her husband's name is Herbie Baltimore. They have 3 children. She has been disabled since 2015. She previously worked in [employer]. She lives in [place] her husband, son, and eldest daughter. The patient is alone today.   Allergies:  Allergies  Allergen Reactions  . Gabapentin Other (See Comments)    Weight gain  . Ibuprofen Other (See Comments)    Headache    Current Medications: Current Outpatient Medications  Medication Sig Dispense Refill  . Adalimumab (HUMIRA) 40 MG/0.8ML PSKT Inject 40 mg into the skin every 14 (fourteen) days.     . Calcium Carbonate-Vitamin D 600-400 MG-UNIT chew tablet Chew 1 tablet by mouth 2 (two) times daily.     . Cholecalciferol 50 MCG (2000 UT) CAPS Take 2,000 Units by mouth daily.    . cyclobenzaprine (FLEXERIL) 10 MG tablet Take 10 mg by mouth at bedtime.    . enalapril-hydrochlorothiazide (VASERETIC) 10-25 MG per tablet Take  1 tablet by mouth daily.    Marland Kitchen FLUoxetine (PROZAC) 40 MG capsule Take 40 mg by mouth daily.    . folic acid (FOLVITE) 1 MG tablet Take 1 mg by mouth daily.    . hydroxychloroquine (PLAQUENIL) 200 MG tablet Take 200 mg by mouth 2 (two) times daily.    . methotrexate (50 MG/ML) 1 g injection Inject 25 mg into the vein once a week.     . Multiple Vitamins-Minerals (MULTIVITAMIN ADULT PO) Take 1 tablet by mouth daily.     Marland Kitchen omeprazole (PRILOSEC) 40 MG capsule     . pramipexole (MIRAPEX) 0.125 MG tablet Take 1 tablet by  mouth daily.    . rosuvastatin (CRESTOR) 5 MG tablet Take 5 mg by mouth every other day.     . traMADol (ULTRAM) 50 MG tablet     . traZODone (DESYREL) 50 MG tablet Take 1 tablet by mouth at bedtime.     No current facility-administered medications for this visit.    Review of Systems  Constitutional: Positive for malaise/fatigue and weight loss (4 pounds). Negative for chills, diaphoresis and fever.       Feels "like iron is low again".  HENT: Negative.  Negative for congestion, ear pain, hearing loss, nosebleeds, sinus pain and sore throat.   Eyes: Positive for photophobia. Negative for blurred vision and double vision.  Respiratory: Positive for shortness of breath (with exertion). Negative for cough, hemoptysis and sputum production.   Cardiovascular: Negative.  Negative for chest pain, palpitations and orthopnea.  Gastrointestinal: Positive for heartburn (on omeprazole). Negative for abdominal pain, blood in stool, constipation, diarrhea, melena, nausea and vomiting.       Colon polyps.  Last EGD was "quite a while ago".  Occasional indigestion. Intermittent hemorrhoidal bleeding.  Genitourinary: Negative.  Negative for dysuria, flank pain, frequency, hematuria and urgency.  Musculoskeletal: Positive for joint pain (right knee). Negative for back pain, falls, myalgias and neck pain.  Skin: Negative.  Negative for itching and rash.  Neurological: Positive for headaches (increased in past month). Negative for dizziness, tingling, tremors, sensory change, speech change, focal weakness, seizures and weakness.  Endo/Heme/Allergies: Negative.   Psychiatric/Behavioral: Negative for depression and memory loss. The patient has insomnia. The patient is not nervous/anxious.   All other systems reviewed and are negative.  Performance status (ECOG): 1  Vitals Blood pressure 138/76, pulse 89, temperature (!) 97.5 F (36.4 C), temperature source Tympanic, resp. rate 18, weight 213 lb 13.5 oz (97  kg), SpO2 100 %.  Physical Exam  Constitutional: She is oriented to person, place, and time. She appears well-developed and well-nourished. No distress. Face mask in place.  HENT:  Head: Normocephalic and atraumatic.  Mouth/Throat: Oropharynx is clear and moist and mucous membranes are normal. No oropharyngeal exudate.  Long brown hair.  Dry mouth.  Mask.  Eyes: Pupils are equal, round, and reactive to light. Conjunctivae are normal. No scleral icterus.  Glasses.  Sensitive to light.  Neck: No JVD present.  Cardiovascular: Normal rate, regular rhythm and normal heart sounds. Exam reveals no gallop.  No murmur heard. Pulmonary/Chest: Effort normal and breath sounds normal. No respiratory distress. She has no wheezes. She has no rales. She exhibits no tenderness.  Abdominal: Soft. Bowel sounds are normal. She exhibits no distension and no mass. There is no abdominal tenderness. There is no rebound and no guarding.  Musculoskeletal:        General: No tenderness or edema. Normal range of motion.  Cervical back: Normal range of motion and neck supple.  Lymphadenopathy:       Head (right side): No preauricular, no posterior auricular and no occipital adenopathy present.       Head (left side): No preauricular, no posterior auricular and no occipital adenopathy present.    She has no cervical adenopathy.    She has no axillary adenopathy.       Right: No inguinal and no supraclavicular adenopathy present.       Left: No inguinal and no supraclavicular adenopathy present.  Neurological: She is alert and oriented to person, place, and time.  Skin: Skin is warm and dry. No rash noted. She is not diaphoretic. No erythema. No pallor.  Psychiatric: She has a normal mood and affect. Her behavior is normal. Thought content normal.  Nursing note and vitals reviewed.   Appointment on 02/26/2020  Component Date Value Ref Range Status  . Iron 02/26/2020 15* 28 - 170 ug/dL Final  . TIBC 02/26/2020  378  250 - 450 ug/dL Final  . Saturation Ratios 02/26/2020 4* 10.4 - 31.8 % Final  . UIBC 02/26/2020 363  ug/dL Final   Performed at [facility], [address], [place], [place] [address]  . Ferritin 02/26/2020 14  11 - 307 ng/mL Final   Performed at [facility], [address] [place], [place] [address]  . WBC 02/26/2020 10.9* 4.0 - 10.5 K/uL Final  . RBC 02/26/2020 3.42* 3.87 - 5.11 MIL/uL Final  . Hemoglobin 02/26/2020 9.5* 12.0 - 15.0 g/dL Final  . HCT 02/26/2020 30.7* 36.0 - 46.0 % Final  . MCV 02/26/2020 89.8  80.0 - 100.0 fL Final  . MCH 02/26/2020 27.8  26.0 - 34.0 pg Final  . MCHC 02/26/2020 30.9  30.0 - 36.0 g/dL Final  . RDW 02/26/2020 16.5* 11.5 - 15.5 % Final  . Platelets 02/26/2020 374  150 - 400 K/uL Final  . nRBC 02/26/2020 0.0  0.0 - 0.2 % Final  . Neutrophils Relative % 02/26/2020 61  % Final  . Neutro Abs 02/26/2020 6.8  1.7 - 7.7 K/uL Final  . Lymphocytes Relative 02/26/2020 23  % Final  . Lymphs Abs 02/26/2020 2.5  0.7 - 4.0 K/uL Final  . Monocytes Relative 02/26/2020 12  % Final  . Monocytes Absolute 02/26/2020 1.3* 0.1 - 1.0 K/uL Final  . Eosinophils Relative 02/26/2020 2  % Final  . Eosinophils Absolute 02/26/2020 0.2  0.0 - 0.5 K/uL Final  . Basophils Relative 02/26/2020 1  % Final  . Basophils Absolute 02/26/2020 0.1  0.0 - 0.1 K/uL Final  . Immature Granulocytes 02/26/2020 1  % Final  . Abs Immature Granulocytes 02/26/2020 0.05  0.00 - 0.07 K/uL Final   Performed at [facility], [address], [place], [place] [address]    Assessment:  Tonya Cummings is a 69 y.o. female withseropositive rheumatoid arthritisand a progressive normocytic anemia. She was initially diagnosed in 2000. She is currently on methotrexate SQ oncea week, Plaquenil 200 mg twice daily and Humira 40 mgeveryother week (since 05/2016). She previously received Morrie Sheldon (2014/2015-02/2016) and Enbrel 25 mg biweekly. Shetakes folic acid1 mg a  day.   Work-up on 09/07/2019 revealed a hematocrit of 26.2, hemoglobin 8.0, MCV 82.4, platelets 351,000, white count 7900 with an ANC of 5000.  Ferritin was 10 with an iron saturation of 6% and a TIBC of 428.  CRP and sed rate were levated.  B12 was 339 (low normal).  Normal studies included:  folate (> 100), TSH, and haptoglobin.  LDH was 209.  Retic was 1.9%.  She has iron deficiency.  She received Venofer weekly x 3 (10/03/2019 - 10/16/2019).  Ferritin has been followed:  10 on 09/07/2019, 16 on 09/26/2019, 19 on 10/03/2019, 191 on 10/23/2019, 40 on 12/24/2019, and 14 on 02/26/2020.  Last EGD was "a while ago".  She is unaware of her last colonoscopy.   she has B12 deficiency.  B12 was 339 on 09/07/2019.  She is on oral B12 500 mcg a day.  B12 was 604 on 10/23/2019.  She has stage III chronic kidney disease.Creatinine is 1.3 (CrCl 41 ml/min).  Symptomatically, she feels more tired and lethargic.  She has occasional reflux.  Exam is stable.  Plan: 1.   Review labs from 02/26/2020. 2. Iron deficiency anemia           Symptomatically, she is fatigued.    Hematocrit 30.7.  Hemoglobin 9.5.  MCV 89.8 on 02/26/2020.              Ferritin 14 (low) with an iron saturation of 4 and TIBC 378.  Patient last received IV iron on 10/16/2019.  She notes intermittent hemorrhoidal bleeding.  She is unaware of her colonoscopy.  EGD was "a while ago".  Discuss follow-up with Gaetano Net, NP regarding possible need for endoscopies.  Discuss reinitiation of IV iron. 3.   B12 deficiency             B12 was 339 on 09/07/2019 and 604 on 10/23/2019.             B12 goal is 400.             She is on B12 500 mcg a day.             Continue to monitor annually with folic acid 4.   Venofer today and weekly x 2 (total 3). 5.   RTC in 2 months for labs (CBC, ferritin). 6.   RTC in 4 months for MD assessment, labs (CBC with diff, ferritin, iron studies- day before) and +/- Venofer.  I discussed the  assessment and treatment plan with the patient.  The patient was provided an opportunity to ask questions and all were answered.  The patient agreed with the plan and demonstrated an understanding of the instructions.  The patient was advised to call back if the symptoms worsen or if the condition fails to improve as anticipated.   Lequita Asal, MD, PhD    02/27/2020, 2:22 PM  I, Jacqualyn Posey, am acting as a Education administrator for [employer]. Mike Gip, MD.   I, Melissa C. Mike Gip, MD, have reviewed the above documentation for accuracy and completeness, and I agree with the above.

## 2020-02-26 NOTE — Progress Notes (Signed)
Confirmed Name and DOB. Denies any concerns.  

## 2020-02-27 ENCOUNTER — Inpatient Hospital Stay: Payer: Medicare HMO

## 2020-02-27 ENCOUNTER — Encounter: Payer: Self-pay | Admitting: Hematology and Oncology

## 2020-02-27 ENCOUNTER — Inpatient Hospital Stay: Payer: Medicare HMO | Admitting: Hematology and Oncology

## 2020-02-27 VITALS — BP 155/83 | HR 87 | Resp 16

## 2020-02-27 VITALS — BP 138/76 | HR 89 | Temp 97.5°F | Resp 18 | Wt 213.8 lb

## 2020-02-27 DIAGNOSIS — M069 Rheumatoid arthritis, unspecified: Secondary | ICD-10-CM | POA: Diagnosis not present

## 2020-02-27 DIAGNOSIS — D649 Anemia, unspecified: Secondary | ICD-10-CM

## 2020-02-27 DIAGNOSIS — N183 Chronic kidney disease, stage 3 unspecified: Secondary | ICD-10-CM | POA: Diagnosis not present

## 2020-02-27 DIAGNOSIS — R519 Headache, unspecified: Secondary | ICD-10-CM | POA: Diagnosis not present

## 2020-02-27 DIAGNOSIS — G2581 Restless legs syndrome: Secondary | ICD-10-CM | POA: Diagnosis not present

## 2020-02-27 DIAGNOSIS — R5383 Other fatigue: Secondary | ICD-10-CM | POA: Diagnosis not present

## 2020-02-27 DIAGNOSIS — D509 Iron deficiency anemia, unspecified: Secondary | ICD-10-CM

## 2020-02-27 DIAGNOSIS — K219 Gastro-esophageal reflux disease without esophagitis: Secondary | ICD-10-CM | POA: Diagnosis not present

## 2020-02-27 DIAGNOSIS — R0602 Shortness of breath: Secondary | ICD-10-CM | POA: Diagnosis not present

## 2020-02-27 DIAGNOSIS — E538 Deficiency of other specified B group vitamins: Secondary | ICD-10-CM | POA: Diagnosis not present

## 2020-02-27 MED ORDER — SODIUM CHLORIDE 0.9 % IV SOLN: 200.0000 mg | Freq: Once | INTRAVENOUS | Status: DC

## 2020-02-27 MED ORDER — SODIUM CHLORIDE 0.9 % IV SOLN
Freq: Once | INTRAVENOUS | Status: AC
  Filled 2020-02-27: qty 250

## 2020-02-27 MED ORDER — IRON SUCROSE 20 MG/ML IV SOLN
200.0000 mg | Freq: Once | INTRAVENOUS | Status: AC
  Administered 2020-02-27: 200 mg via INTRAVENOUS

## 2020-02-27 NOTE — Patient Instructions (Signed)

## 2020-03-27 DIAGNOSIS — Z79899 Other long term (current) drug therapy: Secondary | ICD-10-CM | POA: Diagnosis not present

## 2020-03-27 DIAGNOSIS — M1711 Unilateral primary osteoarthritis, right knee: Secondary | ICD-10-CM | POA: Diagnosis not present

## 2020-03-27 DIAGNOSIS — M059 Rheumatoid arthritis with rheumatoid factor, unspecified: Secondary | ICD-10-CM | POA: Diagnosis not present

## 2020-03-31 DIAGNOSIS — G8929 Other chronic pain: Secondary | ICD-10-CM | POA: Diagnosis not present

## 2020-03-31 DIAGNOSIS — N1832 Chronic kidney disease, stage 3b: Secondary | ICD-10-CM | POA: Diagnosis not present

## 2020-03-31 DIAGNOSIS — I129 Hypertensive chronic kidney disease with stage 1 through stage 4 chronic kidney disease, or unspecified chronic kidney disease: Secondary | ICD-10-CM | POA: Diagnosis not present

## 2020-03-31 DIAGNOSIS — M1711 Unilateral primary osteoarthritis, right knee: Secondary | ICD-10-CM | POA: Diagnosis not present

## 2020-03-31 DIAGNOSIS — E782 Mixed hyperlipidemia: Secondary | ICD-10-CM | POA: Diagnosis not present

## 2020-03-31 DIAGNOSIS — F418 Other specified anxiety disorders: Secondary | ICD-10-CM | POA: Diagnosis not present

## 2020-03-31 DIAGNOSIS — G2581 Restless legs syndrome: Secondary | ICD-10-CM | POA: Diagnosis not present

## 2020-03-31 DIAGNOSIS — M059 Rheumatoid arthritis with rheumatoid factor, unspecified: Secondary | ICD-10-CM | POA: Diagnosis not present

## 2020-03-31 DIAGNOSIS — G479 Sleep disorder, unspecified: Secondary | ICD-10-CM | POA: Diagnosis not present

## 2020-04-15 DIAGNOSIS — M1711 Unilateral primary osteoarthritis, right knee: Secondary | ICD-10-CM | POA: Diagnosis not present

## 2020-04-22 DIAGNOSIS — M1711 Unilateral primary osteoarthritis, right knee: Secondary | ICD-10-CM | POA: Diagnosis not present

## 2020-04-28 ENCOUNTER — Inpatient Hospital Stay: Payer: Medicare HMO | Attending: Hematology and Oncology

## 2020-04-28 ENCOUNTER — Other Ambulatory Visit: Payer: Self-pay

## 2020-04-28 DIAGNOSIS — Z833 Family history of diabetes mellitus: Secondary | ICD-10-CM | POA: Diagnosis not present

## 2020-04-28 DIAGNOSIS — E538 Deficiency of other specified B group vitamins: Secondary | ICD-10-CM | POA: Insufficient documentation

## 2020-04-28 DIAGNOSIS — Z79899 Other long term (current) drug therapy: Secondary | ICD-10-CM | POA: Diagnosis not present

## 2020-04-28 DIAGNOSIS — N183 Chronic kidney disease, stage 3 unspecified: Secondary | ICD-10-CM | POA: Insufficient documentation

## 2020-04-28 DIAGNOSIS — Z8249 Family history of ischemic heart disease and other diseases of the circulatory system: Secondary | ICD-10-CM | POA: Diagnosis not present

## 2020-04-28 DIAGNOSIS — Z8269 Family history of other diseases of the musculoskeletal system and connective tissue: Secondary | ICD-10-CM | POA: Insufficient documentation

## 2020-04-28 DIAGNOSIS — D509 Iron deficiency anemia, unspecified: Secondary | ICD-10-CM | POA: Diagnosis not present

## 2020-04-28 DIAGNOSIS — M069 Rheumatoid arthritis, unspecified: Secondary | ICD-10-CM | POA: Insufficient documentation

## 2020-04-28 LAB — CBC
HCT: 34.8 % — ABNORMAL LOW (ref 36.0–46.0)
Hemoglobin: 10.9 g/dL — ABNORMAL LOW (ref 12.0–15.0)
MCH: 28 pg (ref 26.0–34.0)
MCHC: 31.3 g/dL (ref 30.0–36.0)
MCV: 89.5 fL (ref 80.0–100.0)
Platelets: 330 10*3/uL (ref 150–400)
RBC: 3.89 MIL/uL (ref 3.87–5.11)
RDW: 18.5 % — ABNORMAL HIGH (ref 11.5–15.5)
WBC: 9.3 10*3/uL (ref 4.0–10.5)
nRBC: 0 % (ref 0.0–0.2)

## 2020-04-28 LAB — FERRITIN: Ferritin: 34 ng/mL (ref 11–307)

## 2020-04-29 ENCOUNTER — Telehealth: Payer: Self-pay

## 2020-04-29 DIAGNOSIS — M1711 Unilateral primary osteoarthritis, right knee: Secondary | ICD-10-CM | POA: Diagnosis not present

## 2020-04-29 NOTE — Telephone Encounter (Signed)
Left message for patient to return my call about her lab work

## 2020-04-29 NOTE — Telephone Encounter (Signed)
Patient aware, Tonya Cummings to schedule

## 2020-04-29 NOTE — Telephone Encounter (Signed)
-----   Message from Lequita Asal, MD sent at 04/29/2020  8:25 AM EDT ----- Regarding: Please call patient  Ferritin has increased from 14 to 34.  Ferritin goal 100.  Hemoglobin has improved from 9.5 to 10.9 (normal 12).  Typically give Venofer if ferritin <= 30.  Ferritin borderline.  Consider Venofer weekly x 2.  M  ----- Message ----- From: Buel Ream, Lab In [place] Sent: 04/28/2020   1:11 PM EDT To: Lequita Asal, MD

## 2020-04-29 NOTE — Telephone Encounter (Signed)
-----   Message from Lequita Asal, MD sent at 04/29/2020  8:25 AM EDT ----- Regarding: Please call patient  Ferritin has increased from 14 to 34.  Ferritin goal 100.  Hemoglobin has improved from 9.5 to 10.9 (normal 12).  Typically give Venofer if ferritin <= 30.  Ferritin borderline.  Consider Venofer weekly x 2.  M  ----- Message ----- From: Buel Ream, Lab In [place] Sent: 04/28/2020   1:11 PM EDT To: Lequita Asal, MD

## 2020-05-02 ENCOUNTER — Inpatient Hospital Stay: Payer: Medicare HMO

## 2020-05-02 ENCOUNTER — Other Ambulatory Visit: Payer: Self-pay

## 2020-05-02 ENCOUNTER — Other Ambulatory Visit: Payer: Self-pay | Admitting: Hematology and Oncology

## 2020-05-02 DIAGNOSIS — Z8249 Family history of ischemic heart disease and other diseases of the circulatory system: Secondary | ICD-10-CM | POA: Diagnosis not present

## 2020-05-02 DIAGNOSIS — M069 Rheumatoid arthritis, unspecified: Secondary | ICD-10-CM | POA: Diagnosis not present

## 2020-05-02 DIAGNOSIS — E538 Deficiency of other specified B group vitamins: Secondary | ICD-10-CM | POA: Diagnosis not present

## 2020-05-02 DIAGNOSIS — Z833 Family history of diabetes mellitus: Secondary | ICD-10-CM | POA: Diagnosis not present

## 2020-05-02 DIAGNOSIS — Z79899 Other long term (current) drug therapy: Secondary | ICD-10-CM | POA: Diagnosis not present

## 2020-05-02 DIAGNOSIS — D649 Anemia, unspecified: Secondary | ICD-10-CM

## 2020-05-02 DIAGNOSIS — Z8269 Family history of other diseases of the musculoskeletal system and connective tissue: Secondary | ICD-10-CM | POA: Diagnosis not present

## 2020-05-02 DIAGNOSIS — N183 Chronic kidney disease, stage 3 unspecified: Secondary | ICD-10-CM | POA: Diagnosis not present

## 2020-05-02 DIAGNOSIS — D509 Iron deficiency anemia, unspecified: Secondary | ICD-10-CM | POA: Diagnosis not present

## 2020-05-02 MED ORDER — SODIUM CHLORIDE 0.9 % IV SOLN: 200.0000 mg | Freq: Once | INTRAVENOUS | Status: DC

## 2020-05-02 MED ORDER — SODIUM CHLORIDE 0.9 % IV SOLN
Freq: Once | INTRAVENOUS | Status: AC
  Filled 2020-05-02: qty 250

## 2020-05-02 MED ORDER — IRON SUCROSE 20 MG/ML IV SOLN
200.0000 mg | Freq: Once | INTRAVENOUS | Status: AC
  Administered 2020-05-02: 200 mg via INTRAVENOUS
  Filled 2020-05-02: qty 10

## 2020-05-07 ENCOUNTER — Inpatient Hospital Stay: Payer: Medicare HMO

## 2020-05-07 ENCOUNTER — Other Ambulatory Visit: Payer: Self-pay

## 2020-05-07 VITALS — BP 133/79 | HR 91 | Temp 99.4°F | Resp 18

## 2020-05-07 DIAGNOSIS — Z8249 Family history of ischemic heart disease and other diseases of the circulatory system: Secondary | ICD-10-CM | POA: Diagnosis not present

## 2020-05-07 DIAGNOSIS — Z8269 Family history of other diseases of the musculoskeletal system and connective tissue: Secondary | ICD-10-CM | POA: Diagnosis not present

## 2020-05-07 DIAGNOSIS — M069 Rheumatoid arthritis, unspecified: Secondary | ICD-10-CM | POA: Diagnosis not present

## 2020-05-07 DIAGNOSIS — Z79899 Other long term (current) drug therapy: Secondary | ICD-10-CM | POA: Diagnosis not present

## 2020-05-07 DIAGNOSIS — N183 Chronic kidney disease, stage 3 unspecified: Secondary | ICD-10-CM | POA: Diagnosis not present

## 2020-05-07 DIAGNOSIS — D649 Anemia, unspecified: Secondary | ICD-10-CM

## 2020-05-07 DIAGNOSIS — D509 Iron deficiency anemia, unspecified: Secondary | ICD-10-CM | POA: Diagnosis not present

## 2020-05-07 DIAGNOSIS — E538 Deficiency of other specified B group vitamins: Secondary | ICD-10-CM | POA: Diagnosis not present

## 2020-05-07 DIAGNOSIS — Z833 Family history of diabetes mellitus: Secondary | ICD-10-CM | POA: Diagnosis not present

## 2020-05-07 MED ORDER — SODIUM CHLORIDE 0.9 % IV SOLN
Freq: Once | INTRAVENOUS | Status: AC
  Filled 2020-05-07: qty 250

## 2020-05-07 MED ORDER — IRON SUCROSE 20 MG/ML IV SOLN
200.0000 mg | Freq: Once | INTRAVENOUS | Status: AC
  Administered 2020-05-07: 200 mg via INTRAVENOUS

## 2020-05-07 MED ORDER — SODIUM CHLORIDE 0.9 % IV SOLN: 200.0000 mg | Freq: Once | INTRAVENOUS | Status: DC

## 2020-05-28 DIAGNOSIS — R1310 Dysphagia, unspecified: Secondary | ICD-10-CM | POA: Diagnosis not present

## 2020-05-28 DIAGNOSIS — R1013 Epigastric pain: Secondary | ICD-10-CM | POA: Diagnosis not present

## 2020-05-28 DIAGNOSIS — D509 Iron deficiency anemia, unspecified: Secondary | ICD-10-CM | POA: Diagnosis not present

## 2020-06-13 ENCOUNTER — Ambulatory Visit
Admission: EM | Admit: 2020-06-13 | Discharge: 2020-06-13 | Disposition: A | Discharge status: Home / Self Care | Payer: Medicare HMO | Attending: Family Medicine | Admitting: Family Medicine

## 2020-06-13 ENCOUNTER — Encounter: Payer: Self-pay | Admitting: Emergency Medicine

## 2020-06-13 ENCOUNTER — Ambulatory Visit (INDEPENDENT_AMBULATORY_CARE_PROVIDER_SITE_OTHER): Payer: Medicare HMO

## 2020-06-13 ENCOUNTER — Other Ambulatory Visit: Payer: Self-pay

## 2020-06-13 ENCOUNTER — Ambulatory Visit: Payer: Medicare HMO

## 2020-06-13 DIAGNOSIS — M25561 Pain in right knee: Secondary | ICD-10-CM | POA: Diagnosis not present

## 2020-06-13 DIAGNOSIS — W19XXXA Unspecified fall, initial encounter: Secondary | ICD-10-CM

## 2020-06-13 HISTORY — DX: Depression, unspecified: F32.A

## 2020-06-13 MED ORDER — TRAMADOL HCL 50 MG PO TABS: 50.0000 mg | ORAL_TABLET | Freq: Four times a day (QID) | ORAL | 0 refills | Status: DC | PRN

## 2020-06-13 NOTE — ED Triage Notes (Signed)
After reviewing care everywhere, patient had 3 knee injections in May 2021.

## 2020-06-13 NOTE — ED Provider Notes (Signed)
[place]   [id number] 06/13/20 Arrival Time: 36  WN:UUVOZ PAIN  SUBJECTIVE: History from: patient. Tonya Cummings is a 69 y.o. female complains of r right knee pain that began 4 days ago.  Reports that she was at home and she was trying to move around, that she fell and her knee popped.  Reports that she has not been able to bear weight on the right leg since then.   Has tried OTC medications without relief.  Symptoms are made worse with activity.  Reports that she has been using rolling walker at home.  Denies similar symptoms in the past.  Denies fever, chills, erythema, ecchymosis, effusion, weakness, numbness and tingling, saddle paresthesias, loss of bowel or bladder function.      ROS: As per HPI.  All other pertinent ROS negative.     Past Medical History:  Diagnosis Date  . Depression   . Hypertension   . Osteoporosis   . Rheumatoid aortitis   . Rheumatoid arthritis ([place])   . Rheumatoid arthritis [facility])    Past Surgical History:  Procedure Laterality Date  . CARPAL TUNNEL RELEASE Bilateral   . CHOLECYSTECTOMY    . HIP SURGERY Left   . TUBAL LIGATION     Allergies  Allergen Reactions  . Gabapentin Other (See Comments)    Weight gain  . Ibuprofen Other (See Comments)    Headache   No current facility-administered medications on file prior to encounter.   Current Outpatient Medications on File Prior to Encounter  Medication Sig Dispense Refill  . Adalimumab (HUMIRA) 40 MG/0.8ML PSKT Inject 40 mg into the skin every 14 (fourteen) days.     . Calcium Carbonate-Vitamin D 600-400 MG-UNIT chew tablet Chew 1 tablet by mouth 2 (two) times daily.     . Cholecalciferol 50 MCG (2000 UT) CAPS Take 2,000 Units by mouth daily.    . cyclobenzaprine (FLEXERIL) 10 MG tablet Take 10 mg by mouth at bedtime.    . enalapril-hydrochlorothiazide (VASERETIC) 10-25 MG per tablet Take 1 tablet by mouth daily.    Marland Kitchen FLUoxetine (PROZAC) 40 MG capsule Take 40 mg by mouth daily.     . folic acid (FOLVITE) 1 MG tablet Take 1 mg by mouth daily.    . hydroxychloroquine (PLAQUENIL) 200 MG tablet Take 200 mg by mouth 2 (two) times daily.    . methotrexate (50 MG/ML) 1 g injection Inject 25 mg into the vein once a week.     . Multiple Vitamins-Minerals (MULTIVITAMIN ADULT PO) Take 1 tablet by mouth daily.     Marland Kitchen omeprazole (PRILOSEC) 40 MG capsule     . pramipexole (MIRAPEX) 0.125 MG tablet Take 1 tablet by mouth daily.    . rosuvastatin (CRESTOR) 5 MG tablet Take 5 mg by mouth every other day.     . traZODone (DESYREL) 50 MG tablet Take 1 tablet by mouth at bedtime.     Social History   Socioeconomic History  . Marital status: Married    Spouse name: Not on file  . Number of children: Not on file  . Years of education: Not on file  . Highest education level: Not on file  Occupational History  . Not on file  Tobacco Use  . Smoking status: Never Smoker  . Smokeless tobacco: Never Used  Vaping Use  . Vaping Use: Never used  Substance and Sexual Activity  . Alcohol use: No  . Drug use: No  . Sexual activity: Not on file  Other Topics Concern  . Not on file  Social History Narrative  . Not on file   Social Determinants of Health   Financial Resource Strain:   . Difficulty of Paying Living Expenses:   Food Insecurity:   . Worried About Charity fundraiser in the Last Year:   . Arboriculturist in the Last Year:   Transportation Needs:   . Film/video editor (Medical):   Marland Kitchen Lack of Transportation (Non-Medical):   Physical Activity:   . Days of Exercise per Week:   . Minutes of Exercise per Session:   Stress:   . Feeling of Stress :   Social Connections:   . Frequency of Communication with Friends and Family:   . Frequency of Social Gatherings with Friends and Family:   . Attends Religious Services:   . Active Member of Clubs or Organizations:   . Attends Archivist Meetings:   Marland Kitchen Marital Status:   Intimate Partner Violence:   . Fear of  Current or Ex-Partner:   . Emotionally Abused:   Marland Kitchen Physically Abused:   . Sexually Abused:    Family History  Problem Relation Age of Onset  . Diabetes Mother   . Aneurysm Father   . Diabetes Son   . Osteosarcoma Brother     OBJECTIVE:  Vitals:   06/13/20 1125  BP: 138/76  Pulse: 86  Resp: 18  Temp: 98.1 F (36.7 C)  TempSrc: Oral  SpO2: 97%  Weight: 220 lb (99.8 kg)  Height: 5\' 4"  (1.626 m)    General appearance: ALERT; in no acute distress.  Head: NCAT Lungs: Normal respiratory effort CV:  pulses 2+ bilaterally. Cap refill < 2 seconds Musculoskeletal:  Inspection: Skin warm, dry, clear and intact without obvious erythema, effusion, or ecchymosis.  Palpation: Lateral aspect of right knee, as well as lateral aspect of tibial plateau tender to palpation ROM: limited ROM active and passive with the right knee Skin: warm and dry Neurologic: Ambulates without difficulty; Sensation intact about the upper/ lower extremities Psychological: alert and cooperative; normal mood and affect  DIAGNOSTIC STUDIES:  DG Knee Complete 4 Views Right  Result Date: 06/13/2020 CLINICAL DATA:  Acute right knee pain after feeling a pop 3 days ago. EXAM: RIGHT KNEE - COMPLETE 4+ VIEW COMPARISON:  None. FINDINGS: No acute fracture or dislocation. Moderate joint effusion. Severe lateral compartment joint space narrowing with bone-on-bone appearance. Mild medial compartment joint space narrowing. Small subchondral lucency in the lateral femoral condyle. Chondrocalcinosis of the menisci with extrusion and displacement of the lateral meniscus body. Osteopenia. Soft tissues are unremarkable. IMPRESSION: 1. No acute osseous abnormality. Moderate joint effusion. 2. Severe lateral and mild medial compartment osteoarthritis. Suspected small osteochondral lesion of the lateral femoral condyle. 3. Meniscal chondrocalcinosis with extruded and displaced lateral meniscus body, consistent with underlying tear.  Electronically Signed   By: Titus Dubin M.D.   On: 06/13/2020 12:42     ASSESSMENT & PLAN:  1. Acute pain of right knee   2. Fall, initial encounter    Xray suggests a torn meniscus and shows osteoarthritis Get in touch and follow up with ortho This may require surgical repair Continue conservative management of rest, ice, and gentle stretches Take tramadol as needed for pain relief (may cause abdominal discomfort, ulcers, and GI bleeds avoid taking with other NSAIDs)  Follow up with PCP if symptoms persist Return or go to the ER if you have any new or worsening symptoms (fever,  chills, chest pain, abdominal pain, changes in bowel or bladder habits, pain radiating into lower legs)   I have reviewed PDMP this encounter  Reviewed expectations re: course of current medical issues. Questions answered. Outlined signs and symptoms indicating need for more acute intervention. Patient verbalized understanding. After Visit Summary given.       Faustino Congress, NP 06/13/20 1254

## 2020-06-13 NOTE — Discharge Instructions (Addendum)
Your x-ray shows that there is likely a meniscal tear in your knee  I would recommend that you follow-up with orthopedics to have this repaired

## 2020-06-13 NOTE — ED Triage Notes (Signed)
Patient in today c/o 6 month history of right leg pain worse in the last 3 days. No injury noted. Patient states that she had the "gel" injection in her right knee in March 2021.

## 2020-06-23 DIAGNOSIS — M25561 Pain in right knee: Secondary | ICD-10-CM | POA: Diagnosis not present

## 2020-06-26 DIAGNOSIS — Z79899 Other long term (current) drug therapy: Secondary | ICD-10-CM | POA: Diagnosis not present

## 2020-06-26 DIAGNOSIS — M1711 Unilateral primary osteoarthritis, right knee: Secondary | ICD-10-CM | POA: Diagnosis not present

## 2020-06-26 DIAGNOSIS — M059 Rheumatoid arthritis with rheumatoid factor, unspecified: Secondary | ICD-10-CM | POA: Diagnosis not present

## 2020-06-30 ENCOUNTER — Inpatient Hospital Stay: Payer: Medicare HMO | Attending: Hematology and Oncology

## 2020-06-30 ENCOUNTER — Other Ambulatory Visit: Payer: Self-pay

## 2020-06-30 DIAGNOSIS — R5383 Other fatigue: Secondary | ICD-10-CM | POA: Insufficient documentation

## 2020-06-30 DIAGNOSIS — R1314 Dysphagia, pharyngoesophageal phase: Secondary | ICD-10-CM | POA: Insufficient documentation

## 2020-06-30 DIAGNOSIS — M2540 Effusion, unspecified joint: Secondary | ICD-10-CM | POA: Insufficient documentation

## 2020-06-30 DIAGNOSIS — Z886 Allergy status to analgesic agent status: Secondary | ICD-10-CM | POA: Insufficient documentation

## 2020-06-30 DIAGNOSIS — E538 Deficiency of other specified B group vitamins: Secondary | ICD-10-CM | POA: Diagnosis not present

## 2020-06-30 DIAGNOSIS — Z79899 Other long term (current) drug therapy: Secondary | ICD-10-CM | POA: Diagnosis not present

## 2020-06-30 DIAGNOSIS — Z8269 Family history of other diseases of the musculoskeletal system and connective tissue: Secondary | ICD-10-CM | POA: Insufficient documentation

## 2020-06-30 DIAGNOSIS — D509 Iron deficiency anemia, unspecified: Secondary | ICD-10-CM | POA: Diagnosis not present

## 2020-06-30 DIAGNOSIS — Z8249 Family history of ischemic heart disease and other diseases of the circulatory system: Secondary | ICD-10-CM | POA: Insufficient documentation

## 2020-06-30 DIAGNOSIS — N183 Chronic kidney disease, stage 3 unspecified: Secondary | ICD-10-CM | POA: Diagnosis not present

## 2020-06-30 DIAGNOSIS — Z888 Allergy status to other drugs, medicaments and biological substances status: Secondary | ICD-10-CM | POA: Insufficient documentation

## 2020-06-30 DIAGNOSIS — K219 Gastro-esophageal reflux disease without esophagitis: Secondary | ICD-10-CM | POA: Diagnosis not present

## 2020-06-30 DIAGNOSIS — M069 Rheumatoid arthritis, unspecified: Secondary | ICD-10-CM | POA: Diagnosis not present

## 2020-06-30 DIAGNOSIS — M1711 Unilateral primary osteoarthritis, right knee: Secondary | ICD-10-CM | POA: Insufficient documentation

## 2020-06-30 DIAGNOSIS — Z833 Family history of diabetes mellitus: Secondary | ICD-10-CM | POA: Insufficient documentation

## 2020-06-30 LAB — CBC WITH DIFFERENTIAL/PLATELET
Abs Immature Granulocytes: 0.03 10*3/uL (ref 0.00–0.07)
Basophils Absolute: 0 10*3/uL (ref 0.0–0.1)
Basophils Relative: 0 %
Eosinophils Absolute: 0.2 10*3/uL (ref 0.0–0.5)
Eosinophils Relative: 2 %
HCT: 32.1 % — ABNORMAL LOW (ref 36.0–46.0)
Hemoglobin: 10.1 g/dL — ABNORMAL LOW (ref 12.0–15.0)
Immature Granulocytes: 0 %
Lymphocytes Relative: 28 %
Lymphs Abs: 2.2 10*3/uL (ref 0.7–4.0)
MCH: 29.5 pg (ref 26.0–34.0)
MCHC: 31.5 g/dL (ref 30.0–36.0)
MCV: 93.9 fL (ref 80.0–100.0)
Monocytes Absolute: 0.7 10*3/uL (ref 0.1–1.0)
Monocytes Relative: 8 %
Neutro Abs: 4.8 10*3/uL (ref 1.7–7.7)
Neutrophils Relative %: 62 %
Platelets: 245 10*3/uL (ref 150–400)
RBC: 3.42 MIL/uL — ABNORMAL LOW (ref 3.87–5.11)
RDW: 16.6 % — ABNORMAL HIGH (ref 11.5–15.5)
WBC: 7.9 10*3/uL (ref 4.0–10.5)
nRBC: 0 % (ref 0.0–0.2)

## 2020-06-30 NOTE — Progress Notes (Signed)
[facility]  [address] [place], [place] [address] Phone: [phone]  Fax: [phone]   Clinic Day:  07/01/2020  Referring physician: [name], *  Chief Complaint: Tonya Cummings is a 69 y.o. female with rheumatoid arthritis and iron deficiency anemia who is seen for 4 month assessment.  HPI: The patient was last seen in the hematology clinic on 02/27/2020. At that time, she felt more tired and lethargic.  She had occasional reflux.  Exam was stable. She received Venofer at that time.   Labs on 04/28/2020 revealed a hematocrit 34.8, hemoglobin 10.9, MCV 89.5, platelets 330,000, WBC 9300. Ferritin 34.  She received Venofer weekly x 2 (05/02/2020 - 05/07/2020).  She was seen in the [facility] by Stephens November, NP on 05/28/2020.  She described GERD and lower esophageal dysphagia.  Omeprazole was increased to 40 mg BID.  EGD and colonoscopy were scheduled for 08/15/2020.  She presented to the [facility] ER on 06/13/2020 with right leg pain for 6 months, significantly worse in the last 3 days. She stated that she fell and her knee popped and she was unable to bear weight on that leg following that incident. Her right knee was tender to palpation. Right knee films revealed no acute osseous abnormality. There was moderate joint effusion and severe lateral and mild medial compartment osteoarthritis. There was suspected small osteochondral lesion of the lateral femoral condyle. Meniscal chondrocalcinosis with extruded and displaced lateral meniscus body, consistent with underlying tear.  She was prescribed tramadol for pain and referred to orthopedics.  She notes that a total knee replacement is planned.  During the interim, she notes that she has felt colder than usual, so she has concerns that her ferritin is low again. She notes that hasn't craved any ice or cucumbers like she did before though.    Past Medical History:  Diagnosis  Date  . Depression   . Hypertension   . Osteoporosis   . Rheumatoid aortitis   . Rheumatoid arthritis ([place])   . Rheumatoid arthritis [facility])     Past Surgical History:  Procedure Laterality Date  . CARPAL TUNNEL RELEASE Bilateral   . CHOLECYSTECTOMY    . HIP SURGERY Left   . TUBAL LIGATION      Family History  Problem Relation Age of Onset  . Diabetes Mother   . Aneurysm Father   . Diabetes Son   . Osteosarcoma Brother     Social History:  reports that she has never smoked. She has never used smokeless tobacco. She reports that she does not drink alcohol and does not use drugs. She has no known exposure to chemicals or radiation.Her husband's name is Herbie Baltimore. They have 3 children. She has been disabled since 2015. She previously worked in [employer]. She lives in [place] her husband, son, and eldest daughter. The patient is accompanied by her husband via [place] today.   Allergies:  Allergies  Allergen Reactions  . Gabapentin Other (See Comments)    Weight gain  . Ibuprofen Other (See Comments)    Headache    Current Medications: Current Outpatient Medications  Medication Sig Dispense Refill  . Adalimumab (HUMIRA) 40 MG/0.8ML PSKT Inject 40 mg into the skin every 14 (fourteen) days.     . Calcium Carbonate-Vitamin D 600-400 MG-UNIT chew tablet Chew 1 tablet by mouth 2 (two) times daily.     . Cholecalciferol 50 MCG (2000 UT) CAPS Take 2,000 Units by mouth daily.    Marland Kitchen  cyclobenzaprine (FLEXERIL) 10 MG tablet Take 10 mg by mouth at bedtime.    . enalapril-hydrochlorothiazide (VASERETIC) 10-25 MG per tablet Take 1 tablet by mouth daily.    . ferrous sulfate 325 (65 FE) MG tablet Take by mouth.    Marland Kitchen FLUoxetine (PROZAC) 40 MG capsule Take 40 mg by mouth daily.    . folic acid (FOLVITE) 1 MG tablet Take 1 mg by mouth daily.    Marland Kitchen HYDROcodone-acetaminophen (NORCO/VICODIN) 5-325 MG tablet May take 1-2 tablets once a day  if more pain control is needed, 30 days, sixty  tablets    . hydroxychloroquine (PLAQUENIL) 200 MG tablet Take 200 mg by mouth 2 (two) times daily.    . methotrexate (50 MG/ML) 1 g injection Inject 25 mg into the vein once a week.     . Multiple Vitamins-Minerals (MULTIVITAMIN ADULT PO) Take 1 tablet by mouth daily.     Marland Kitchen omeprazole (PRILOSEC) 40 MG capsule     . pramipexole (MIRAPEX) 0.125 MG tablet Take 1 tablet by mouth daily.    . rosuvastatin (CRESTOR) 5 MG tablet Take 5 mg by mouth every other day.     . traMADol (ULTRAM) 50 MG tablet Take 1 tablet (50 mg total) by mouth every 6 (six) hours as needed. 20 tablet 0  . traZODone (DESYREL) 50 MG tablet Take 1 tablet by mouth at bedtime.     No current facility-administered medications for this visit.    Review of Systems  Constitutional: Positive for malaise/fatigue. Negative for chills, diaphoresis, fever and weight loss (up 13 pounds).       Feels "like iron is low again".  HENT: Negative.  Negative for congestion, ear pain, hearing loss, nosebleeds, sinus pain and sore throat.   Eyes: Positive for photophobia. Negative for blurred vision and double vision.  Respiratory: Positive for shortness of breath (with exertion). Negative for cough, hemoptysis and sputum production.   Cardiovascular: Negative.  Negative for chest pain, palpitations and orthopnea.  Gastrointestinal: Positive for heartburn (on omeprazole). Negative for abdominal pain, blood in stool, constipation, diarrhea, melena, nausea and vomiting.       Colon polyps. Occasional indigestion. Intermittent hemorrhoidal bleeding.  EGD and colonoscopy planned 08/15/2020.  Genitourinary: Negative for dysuria, flank pain, frequency, hematuria and urgency.  Musculoskeletal: Positive for joint pain (right knee, torn miniscus). Negative for back pain, falls, myalgias and neck pain.  Skin: Negative.  Negative for itching and rash.  Neurological: Positive for headaches. Negative for dizziness, tingling, tremors, sensory change, speech  change, focal weakness, seizures and weakness.  Endo/Heme/Allergies: Negative.   Psychiatric/Behavioral: Negative for depression and memory loss. The patient has insomnia (wakes up 2-3 times). The patient is not nervous/anxious.   All other systems reviewed and are negative.  Performance status (ECOG):  1   Vitals Blood pressure (!) 152/74, pulse 75, temperature (!) 95.8 F (35.4 C), temperature source Tympanic, resp. rate 18, weight 226 lb (102.5 kg), SpO2 100 %.  Physical Exam Vitals and nursing note reviewed.  Constitutional:      General: She is not in acute distress.    Appearance: She is well-developed. She is not diaphoretic.     Interventions: Face mask in place.     Comments: Patient sitting comfortably in a wheelchair in no acute distress.  HENT:     Head: Normocephalic and atraumatic.     Mouth/Throat:     Pharynx: No oropharyngeal exudate.  Eyes:     General: No scleral icterus.  Pupils: Pupils are equal, round, and reactive to light.     Comments: Glasses.  Sensitive to light. Slightly injected.  Neck:     Vascular: No JVD.  Cardiovascular:     Rate and Rhythm: Normal rate and regular rhythm.     Heart sounds: Normal heart sounds. No murmur heard.  No gallop.   Pulmonary:     Effort: Pulmonary effort is normal. No respiratory distress.     Breath sounds: Normal breath sounds. No wheezing or rales.  Chest:     Chest wall: No tenderness.  Abdominal:     General: Bowel sounds are normal. There is no distension.     Palpations: Abdomen is soft. There is no mass.     Tenderness: There is no abdominal tenderness. There is no guarding or rebound.  Musculoskeletal:        General: No tenderness. Normal range of motion.     Cervical back: Normal range of motion and neck supple.  Lymphadenopathy:     Head:     Right side of head: No preauricular, posterior auricular or occipital adenopathy.     Left side of head: No preauricular, posterior auricular or occipital  adenopathy.     Cervical: No cervical adenopathy.     Upper Body:     Right upper body: No supraclavicular adenopathy.     Left upper body: No supraclavicular adenopathy.     Lower Body: No right inguinal adenopathy. No left inguinal adenopathy.  Skin:    General: Skin is warm and dry.     Coloration: Skin is not pale.     Findings: No erythema or rash.  Neurological:     Mental Status: She is alert and oriented to person, place, and time.     Gait: Gait abnormal (due to torn meniscus).  Psychiatric:        Behavior: Behavior normal.        Thought Content: Thought content normal.    Appointment on 06/30/2020  Component Date Value Ref Range Status  . Iron 06/30/2020 42  28 - 170 ug/dL Final  . TIBC 06/30/2020 270  250 - 450 ug/dL Final  . Saturation Ratios 06/30/2020 16  10.4 - 31.8 % Final  . UIBC 06/30/2020 228  ug/dL Final   Performed at [facility], [address], [place], [place] [address]  . Ferritin 06/30/2020 104  11 - 307 ng/mL Final   Performed at [facility], [address] [place], [place] [address]  . WBC 06/30/2020 7.9  4.0 - 10.5 K/uL Final  . RBC 06/30/2020 3.42* 3.87 - 5.11 MIL/uL Final  . Hemoglobin 06/30/2020 10.1* 12.0 - 15.0 g/dL Final  . HCT 06/30/2020 32.1* 36 - 46 % Final  . MCV 06/30/2020 93.9  80.0 - 100.0 fL Final  . MCH 06/30/2020 29.5  26.0 - 34.0 pg Final  . MCHC 06/30/2020 31.5  30.0 - 36.0 g/dL Final  . RDW 06/30/2020 16.6* 11.5 - 15.5 % Final  . Platelets 06/30/2020 245  150 - 400 K/uL Final  . nRBC 06/30/2020 0.0  0.0 - 0.2 % Final  . Neutrophils Relative % 06/30/2020 62  % Final  . Neutro Abs 06/30/2020 4.8  1.7 - 7.7 K/uL Final  . Lymphocytes Relative 06/30/2020 28  % Final  . Lymphs Abs 06/30/2020 2.2  0.7 - 4.0 K/uL Final  . Monocytes Relative 06/30/2020 8  % Final  . Monocytes Absolute 06/30/2020 0.7  0 - 1 K/uL  Final  . Eosinophils Relative 06/30/2020 2  % Final  . Eosinophils Absolute 06/30/2020 0.2  0 - 0  K/uL Final  . Basophils Relative 06/30/2020 0  % Final  . Basophils Absolute 06/30/2020 0.0  0 - 0 K/uL Final  . Immature Granulocytes 06/30/2020 0  % Final  . Abs Immature Granulocytes 06/30/2020 0.03  0.00 - 0.07 K/uL Final   Performed at [facility], [address], [place], [place] [address]    Assessment:  Tonya Cummings is a 69 y.o. female withseropositive rheumatoid arthritisand a progressive normocytic anemia. She was initially diagnosed in 2000. She is currently on methotrexate SQ oncea week, Plaquenil 200 mg twice daily and Humira 40 mgeveryother week (since 05/2016). She previously received Morrie Sheldon (2014/2015-02/2016) and Enbrel 25 mg biweekly. Shetakes folic acid1 mg a day.   Work-up on 09/07/2019 revealed a hematocrit of 26.2, hemoglobin 8.0, MCV 82.4, platelets 351,000, white count 7900 with an ANC of 5000.  Ferritin was 10 with an iron saturation of 6% and a TIBC of 428.  CRP and sed rate were levated.  B12 was 339 (low normal).  Normal studies included:  folate (> 100), TSH, and haptoglobin.  LDH was 209.  Retic was 1.9%.  She has iron deficiency.  She received Venofer weekly x 3 (10/03/2019 - 10/16/2019) and x 2 (05/02/2020 - 05/07/2020).  Ferritin has been followed:  10 on 09/07/2019, 16 on 09/26/2019, 19 on 10/03/2019, 191 on 10/23/2019, 40 on 12/24/2019, and 14 on 02/26/2020.  Last EGD was "a while ago".  She is unaware of her last colonoscopy.   She has B12 deficiency.  B12 was 339 on 09/07/2019.  She is on oral B12 500 mcg a day.  B12 was 604 on 10/23/2019.  She has stage III chronic kidney disease.Creatinine is 1.3 (CrCl 41 ml/min).  Symptomatically, she feels cold.  She denies any bleeding.  She has a torn meniscus and is planning on surgery.  Plan: 1.   Review labs from 06/30/2020. 2. Iron deficiency anemia           Symptomatically, she feels a fatigue associated with anemia.    Hematocrit 30.7.  Hemoglobin 9.5.  MCV 89.8 on  02/26/2020.              Ferritin 14 (low) with an iron saturation of 4% and TIBC of 378.  Hematocrit 32.1. hemoglobin 10.1.  MCV 93.9 on 06/30/2020.   Ferritin 104 with an iron saturation of 16% and a TIBC of 270.  Ferritin goal 100.  Patient last received IV iron on 05/07/2020.  She denies any bleeding.  She is scheduled for colonoscopy on 08/15/2020.  Alroy Bailiff for today. 3.   B12 deficiency             B12 was 339 on 09/07/2019 and 604 on 10/23/2019.             B12 goal is 400.             She remains on B12 500 mcg a day.             Continue an annual check of B12 and folate. 4.   Venofer today. 5.   RTC in 3 weeks for labs (CBC, ferritin, iron studies, sed rate). 6.   RN to call patient after labs in 3 weeks for possible additional IV iron. 7.   RTC in 3 months for MD assessment, labs (CBC with diff, ferritin, sed rate- day before)  and +/- Venofer.  I discussed the assessment and treatment plan with the patient.  The patient was provided an opportunity to ask questions and all were answered.  The patient agreed with the plan and demonstrated an understanding of the instructions.  The patient was advised to call back if the symptoms worsen or if the condition fails to improve as anticipated.   Lequita Asal, MD, PhD    07/01/2020, 10:24 AM  I, Jacqualyn Posey, am acting as a Education administrator for [employer]. Mike Gip, MD.   I, Katrina Daddona C. Mike Gip, MD, have reviewed the above documentation for accuracy and completeness, and I agree with the above.

## 2020-07-01 ENCOUNTER — Inpatient Hospital Stay: Payer: Medicare HMO

## 2020-07-01 ENCOUNTER — Encounter: Payer: Self-pay | Admitting: Hematology and Oncology

## 2020-07-01 ENCOUNTER — Inpatient Hospital Stay: Payer: Medicare HMO | Admitting: Hematology and Oncology

## 2020-07-01 VITALS — BP 145/78 | HR 73 | Temp 96.8°F | Resp 18

## 2020-07-01 VITALS — BP 152/74 | HR 75 | Temp 95.8°F | Resp 18 | Wt 226.0 lb

## 2020-07-01 DIAGNOSIS — E538 Deficiency of other specified B group vitamins: Secondary | ICD-10-CM | POA: Diagnosis not present

## 2020-07-01 DIAGNOSIS — D649 Anemia, unspecified: Secondary | ICD-10-CM

## 2020-07-01 DIAGNOSIS — K219 Gastro-esophageal reflux disease without esophagitis: Secondary | ICD-10-CM | POA: Diagnosis not present

## 2020-07-01 DIAGNOSIS — M1711 Unilateral primary osteoarthritis, right knee: Secondary | ICD-10-CM | POA: Diagnosis not present

## 2020-07-01 DIAGNOSIS — D509 Iron deficiency anemia, unspecified: Secondary | ICD-10-CM

## 2020-07-01 DIAGNOSIS — R5383 Other fatigue: Secondary | ICD-10-CM | POA: Diagnosis not present

## 2020-07-01 DIAGNOSIS — N183 Chronic kidney disease, stage 3 unspecified: Secondary | ICD-10-CM | POA: Diagnosis not present

## 2020-07-01 DIAGNOSIS — M069 Rheumatoid arthritis, unspecified: Secondary | ICD-10-CM | POA: Diagnosis not present

## 2020-07-01 DIAGNOSIS — M2540 Effusion, unspecified joint: Secondary | ICD-10-CM | POA: Diagnosis not present

## 2020-07-01 DIAGNOSIS — R1314 Dysphagia, pharyngoesophageal phase: Secondary | ICD-10-CM | POA: Diagnosis not present

## 2020-07-01 LAB — IRON AND TIBC
Iron: 42 ug/dL (ref 28–170)
Saturation Ratios: 16 % (ref 10.4–31.8)
TIBC: 270 ug/dL (ref 250–450)
UIBC: 228 ug/dL

## 2020-07-01 LAB — FERRITIN: Ferritin: 104 ng/mL (ref 11–307)

## 2020-07-01 MED ORDER — SODIUM CHLORIDE 0.9 % IV SOLN
Freq: Once | INTRAVENOUS | Status: AC
  Filled 2020-07-01: qty 250

## 2020-07-01 MED ORDER — SODIUM CHLORIDE 0.9 % IV SOLN: 200.0000 mg | Freq: Once | INTRAVENOUS | Status: DC

## 2020-07-01 MED ORDER — IRON SUCROSE 20 MG/ML IV SOLN
200.0000 mg | Freq: Once | INTRAVENOUS | Status: AC
  Administered 2020-07-01: 200 mg via INTRAVENOUS
  Filled 2020-07-01: qty 10

## 2020-07-22 ENCOUNTER — Inpatient Hospital Stay: Payer: Medicare HMO | Attending: Hematology and Oncology

## 2020-07-22 ENCOUNTER — Other Ambulatory Visit: Payer: Self-pay

## 2020-07-22 DIAGNOSIS — D509 Iron deficiency anemia, unspecified: Secondary | ICD-10-CM | POA: Insufficient documentation

## 2020-07-22 LAB — IRON AND TIBC
Iron: 27 ug/dL — ABNORMAL LOW (ref 28–170)
Saturation Ratios: 11 % (ref 10.4–31.8)
TIBC: 244 ug/dL — ABNORMAL LOW (ref 250–450)
UIBC: 217 ug/dL

## 2020-07-22 LAB — CBC WITH DIFFERENTIAL/PLATELET
Abs Immature Granulocytes: 0.07 10*3/uL (ref 0.00–0.07)
Basophils Absolute: 0 10*3/uL (ref 0.0–0.1)
Basophils Relative: 0 %
Eosinophils Absolute: 0.1 10*3/uL (ref 0.0–0.5)
Eosinophils Relative: 1 %
HCT: 31.3 % — ABNORMAL LOW (ref 36.0–46.0)
Hemoglobin: 9.8 g/dL — ABNORMAL LOW (ref 12.0–15.0)
Immature Granulocytes: 1 %
Lymphocytes Relative: 22 %
Lymphs Abs: 2 10*3/uL (ref 0.7–4.0)
MCH: 29.7 pg (ref 26.0–34.0)
MCHC: 31.3 g/dL (ref 30.0–36.0)
MCV: 94.8 fL (ref 80.0–100.0)
Monocytes Absolute: 1 10*3/uL (ref 0.1–1.0)
Monocytes Relative: 11 %
Neutro Abs: 6 10*3/uL (ref 1.7–7.7)
Neutrophils Relative %: 65 %
Platelets: 302 10*3/uL (ref 150–400)
RBC: 3.3 MIL/uL — ABNORMAL LOW (ref 3.87–5.11)
RDW: 16.6 % — ABNORMAL HIGH (ref 11.5–15.5)
WBC: 9.2 10*3/uL (ref 4.0–10.5)
nRBC: 0 % (ref 0.0–0.2)

## 2020-07-22 LAB — SEDIMENTATION RATE: Sed Rate: 51 mm/hr — ABNORMAL HIGH (ref 0–30)

## 2020-07-22 LAB — FERRITIN: Ferritin: 124 ng/mL (ref 11–307)

## 2020-07-29 DIAGNOSIS — N1832 Chronic kidney disease, stage 3b: Secondary | ICD-10-CM | POA: Diagnosis not present

## 2020-07-29 DIAGNOSIS — M059 Rheumatoid arthritis with rheumatoid factor, unspecified: Secondary | ICD-10-CM | POA: Diagnosis not present

## 2020-07-29 DIAGNOSIS — M1711 Unilateral primary osteoarthritis, right knee: Secondary | ICD-10-CM | POA: Diagnosis not present

## 2020-07-29 DIAGNOSIS — Z79899 Other long term (current) drug therapy: Secondary | ICD-10-CM | POA: Diagnosis not present

## 2020-08-13 ENCOUNTER — Other Ambulatory Visit
Admission: RE | Admit: 2020-08-13 | Discharge: 2020-08-13 | Disposition: A | Discharge status: Home / Self Care | Payer: Medicare HMO | Source: Ambulatory Visit | Attending: General Surgery | Admitting: General Surgery

## 2020-08-13 ENCOUNTER — Other Ambulatory Visit: Payer: Self-pay

## 2020-08-13 DIAGNOSIS — Z01812 Encounter for preprocedural laboratory examination: Secondary | ICD-10-CM | POA: Insufficient documentation

## 2020-08-13 DIAGNOSIS — Z20822 Contact with and (suspected) exposure to covid-19: Secondary | ICD-10-CM | POA: Diagnosis not present

## 2020-08-13 LAB — SARS CORONAVIRUS 2 (TAT 6-24 HRS): SARS Coronavirus 2: NEGATIVE

## 2020-08-14 ENCOUNTER — Encounter: Payer: Self-pay | Admitting: General Surgery

## 2020-08-14 NOTE — H&P (Signed)
PATIENT PROFILE: Tonya Cummings is a 69 y.o. female who presents to the [facility] department for consultation at the request of Dr. Dayton Martes for evaluation of IDA.  HISTORY OF PRESENT ILLNESS: Tonya Cummings reports she is doing okay today. States she is having problems with left leg pain today. States she also has some bad problems with acid reflux and lower esophageal dysphagia with solid foods. Happens most nights at supper during the week. There is some occasional nausea but no vomiting. Denies abdominal pain, bowel habit changes, bloody stools. She is taking omeprazole 68m po qd but suboptimally. takes excedrin for headaches but states it is the asa-free type. Denies melena, unplanned weight loss, further GI concerns Hemoccult negative 01/24/20. Last hgb 9.8 Following with Dr CMike Gipfor IV iron, last infusion 3-4w ago, next appt next week. Noted low iron 3.21, ferritin normal. Creatinin 1.3 on 4/21 [place] with Dr BMeda Coffeefor RA- on Humira 468msc q2w, mtx injections weekly, and plaquenil orally. Denies any family history of colorectal cancer, colon polyps, esophageal cancer, stomach cancer, liver disease, PUD. Brother died from [id number] 5030enies any problems with MI/CVA.DM. Last colonoscopy:- states is has been some years, doesn't remember when it was done or who did it. I dont have this report. Last endoscopy:none  GENERAL REVIEW OF SYSTEMS:   10 systems reviewed, unremarkable other than what is written above, with the exception of mild DOE, unsteady gait- uses cane to aid with ambulation, arthralgias  MEDICATIONS: Encounter Medications        Outpatient Encounter Medications as of 05/28/2020  Medication Sig Dispense Refill  . adalimumab (HUMIRA,CF, PEN) 40 mg/0.4 mL pen injector kit Inject 40 mg subcutaneously every 14 (fourteen) days 6 each 1  . ascorbic acid (VITAMIN C ORAL) Take 1 tablet by mouth once daily    . calcium carbonate-vitamin D3 600 mg(1,5006m-400 unit  Cap 1 cap by mouth 2 times a day    . cyclobenzaprine (FLEXERIL) 10 MG tablet Take 1 tablet (10 mg total) by mouth as needed for Muscle spasms 90 tablet 1  . enalapriL-hydrochlorothiazide (VASERETIC) 10-25 mg tablet TAKE 1 TABLET EVERY DAY 90 tablet 1  . FLUoxetine (PROZAC) 40 MG capsule TAKE 1 CAPSULE EVERY DAY 90 capsule 1  . folic acid (FOLVITE) 1 MG tablet 1 tab daily for 90 days 90 tablet 4  . HYDROcodone-acetaminophen (NORCO) 5-325 mg tablet May take 1-2 tablets once a day  if more pain control is needed, 30 days, sixty tablets 60 tablet 0  . hydrOXYchloroQUINE (PLAQUENIL) 200 mg tablet TAKE 1 TABLET TWICE DAILY 180 tablet 1  . meclizine (ANTIVERT) 12.5 mg tablet Take 1 tablet by mouth as needed    . methotrexate 25 mg/mL injection Take 0.6 ml once a week, 12 weeks 7.2 mL 4  . multivitamin capsule 1 cap by mouth daily    . omeprazole (PRILOSEC) 40 MG DR capsule TAKE 1 CAPSULE EVERY DAY 90 capsule 1  . pramipexole (MIRAPEX) 0.125 MG tablet TAKE 1 TABLET IN THE MORNING AND 2 TABLETS IN THE EVENING AS DIRECTED. 270 tablet 1  . rosuvastatin (CRESTOR) 5 MG tablet TAKE 1 TABLET EVERY OTHER DAY 45 tablet 1  . traZODone (DESYREL) 50 MG tablet TAKE 2 TABLETS EVERY NIGHT 180 tablet 1  . cholecalciferol (VITAMIN D3) 2,000 unit capsule 1 daily (Patient taking differently: Take 2,000 Units by mouth once daily.  )    . ferrous sulfate 325 (65 FE) MG tablet Take 325 mg by mouth  daily with breakfast (Patient not taking: Reported on 05/28/2020  )    . insulin syringe-needle U-100 (ULTRA COMFORT INSULIN SYRINGE) 1 mL 29 gauge x 1/2" syringe USE ONCE A WEEK TO ADMINISTER METHOTREXATE 100 each 0   No facility-administered encounter medications on file as of 05/28/2020.      ALLERGIES: Ibuprofen and Gabapentin  PAST MEDICAL HISTORY:     Past Medical History:  Diagnosis Date  . Arthritis   . Chronic pain   . Chronic radicular pain of lower back 02/06/2018  . CKD (chronic kidney disease)  stage 3, GFR 30-59 ml/min (CMS-HCC)   . Costochondritis   . Depression with anxiety   . Encounter for blood transfusion   . Hip dysplasia    right birth,external rotation right leg,chronic  . Hyperlipidemia   . Hypertension   . Iron deficiency anemia 09/12/2019  . Low back pain   . Low vitamin B12 level 09/12/2019  . Migraine headache   . Obesity (BMI 30-39.9), unspecified   . Osteoporosis, post-menopausal    DEXA (07/03/09) - T-score lumbar spine -2.07; right hip -3.28.   Bilateral sacral insufficiency and left acetabular rim fracture.  Pelvic stabilization surgery (02/03/10).   . Pelvic fracture (CMS-HCC)   . Polyarthritis 2000  . Raynaud's disease without gangrene 02/23/2016  . Restless legs syndrome (RLS)   . Seasonal allergies    Has only been a problem since living in [place].  . Seropositive rheumatoid arthritis (CMS-HCC) 02/23/2016  . Sleep disorder 03/22/2018  . Thoracic compression fracture (CMS-HCC)    T11    PAST SURGICAL HISTORY:      Past Surgical History:  Procedure Laterality Date  . ABDOMINAL SURGERY  02/1971  . APPENDECTOMY  03/192  . CARPAL TUNNEL RELEASE     bilat  . CHOLECYSTECTOMY    . FRACTURE SURGERY    . HIP FRACTURE SURGERY    . pelvic fracture plate    . TUBAL LIGATION       FAMILY HISTORY:      Family History  Problem Relation Age of Onset  . Diabetes type II Mother   . Diabetes Mother   . High blood pressure (Hypertension) Mother   . Cancer Brother   . Diabetes Brother   . Heart disease Maternal Grandfather   . Diabetes Maternal Grandfather   . Seizures Son   . Cancer Brother   . Diabetes Brother   . Diabetes Maternal Grandmother   . Rheum arthritis Neg Hx      SOCIAL HISTORY: Social History          Socioeconomic History  . Marital status: Married    Spouse name: Herbie Baltimore  . Number of children: 3  . Years of education: Not on file  . Highest education level: Not on  file  Occupational History  . Occupation: Disabled in 2015    Comment: Worked in Chief [employer]  Tobacco Use  . Smoking status: Never Smoker  . Smokeless tobacco: Never Used  Vaping Use  . Vaping Use: Never used  Substance and Sexual Activity  . Alcohol use: No  . Drug use: No  . Sexual activity: Yes    Partners: Male    Birth control/protection: Post-menopausal, Surgical  Other Topics Concern  . Not on file  Social History Narrative   2017:  Young, 3-4 yo grandson, living w/ them currently.   Social Determinants of Health      Financial Resource Strain:   . Difficulty of Paying  Living Expenses:   Food Insecurity:   . Worried About Charity fundraiser in the Last Year:   . Arboriculturist in the Last Year:   Transportation Needs:   . Film/video editor (Medical):   Marland Kitchen Lack of Transportation (Non-Medical):       PHYSICAL EXAM:    Vitals:   05/28/20 1033  BP: 140/80  Pulse: 102  Temp: 36.3 C (97.4 F)   Body mass index is 38.71 kg/m. Weight: (!) 103.9 kg (229 lb)   General Appearance:    Alert, cooperative, no distress, appears stated age  Head:     Atraumatic, normocephalic  Eyes:   Anciteric  Neck:   Supple, symmetrical, trachea midline, no adenopathy, no thyroid enlargement/tenderness/nodules; no JVD  Throat:   Lips, mucosa, and tongue normal; teeth and gums normal  Lungs:     Clear to auscultation bilaterally, respirations unlabored   Heart:    Regular rate and rhythm, S1 and S2 normal, no murmur, rub   or gallop  Abdomen:     Soft, non-tender, bowel sounds active all four quadrants,    no masses, no organomegaly  Extremities:   Extremities normal, atraumatic, no cyanosis or edema  Skin:   Skin color, texture, turgor normal, no rashes or lesions   Neurologic:   Grossly intact    REVIEW OF DATA: I have reviewed the following data today:      Office Visit on 03/27/2020  Component Date Value  . C Reactive Protein - Lab*  03/27/2020 13*  . Creatinine 03/27/2020 1.3*  . Glomerular Filtration Ra* 03/27/2020 41*  . AST  03/27/2020 21   . ALT  03/27/2020 15   . WBC (White Blood Cell Co* 03/27/2020 8.4   . RBC (Red Blood Cell Coun* 03/27/2020 3.48*  . Hemoglobin 03/27/2020 9.8*  . Hematocrit 03/27/2020 31.8*  . MCV (Mean Corpuscular Vo* 03/27/2020 91.4   . MCH (Mean Corpuscular He* 03/27/2020 28.2   . MCHC (Mean Corpuscular H* 03/27/2020 30.8*  . Platelet Count 03/27/2020 329   . RDW-CV (Red Cell Distrib* 03/27/2020 18.2*  . MPV (Mean Platelet Volum* 03/27/2020 10.2   . Neutrophils 03/27/2020 5.36   . Lymphocytes 03/27/2020 1.99   . Monocytes 03/27/2020 0.88   . Eosinophils 03/27/2020 0.06   . Basophils 03/27/2020 0.04   . Neutrophil % 03/27/2020 64.3   . Lymphocyte % 03/27/2020 23.8   . Monocyte % 03/27/2020 10.5   . Eosinophil % 03/27/2020 0.7*  . Basophil% 03/27/2020 0.5   . Immature Granulocyte % 03/27/2020 0.2   . Immature Granulocyte Cou* 03/27/2020 0.02     ASSESSMENT AND PLAN: Tonya Cummings is a 69 y.o. female presenting for consultation for IDA, there is associated gerd and lower esophageal dysphagia.  Already following with hematology. Increase omeprazole 65m po bid and explained how to take. Schedule egd/colonoscopy with monitored anesthesia,  Procedure information given: indications, benefits, risks- including, but not limited to bleeding, infection, perforation, difficulty with sedation, were discussed with the patient, and they are agreeable to the procedure. Follow up 2-3w after procedures, sooner if problems. May need VCE.

## 2020-08-15 ENCOUNTER — Encounter
Admission: RE | Disposition: A | Discharge status: Home / Self Care | Payer: Self-pay | Source: Home / Self Care | Attending: General Surgery

## 2020-08-15 ENCOUNTER — Ambulatory Visit: Payer: Medicare HMO | Admitting: Anesthesiology

## 2020-08-15 ENCOUNTER — Ambulatory Visit
Admission: RE | Admit: 2020-08-15 | Discharge: 2020-08-15 | Disposition: A | Discharge status: Home / Self Care | Payer: Medicare HMO | Attending: General Surgery | Admitting: General Surgery

## 2020-08-15 ENCOUNTER — Other Ambulatory Visit: Payer: Self-pay

## 2020-08-15 DIAGNOSIS — E1122 Type 2 diabetes mellitus with diabetic chronic kidney disease: Secondary | ICD-10-CM | POA: Insufficient documentation

## 2020-08-15 DIAGNOSIS — I129 Hypertensive chronic kidney disease with stage 1 through stage 4 chronic kidney disease, or unspecified chronic kidney disease: Secondary | ICD-10-CM | POA: Diagnosis not present

## 2020-08-15 DIAGNOSIS — K5730 Diverticulosis of large intestine without perforation or abscess without bleeding: Secondary | ICD-10-CM | POA: Diagnosis not present

## 2020-08-15 DIAGNOSIS — Z886 Allergy status to analgesic agent status: Secondary | ICD-10-CM | POA: Diagnosis not present

## 2020-08-15 DIAGNOSIS — K5790 Diverticulosis of intestine, part unspecified, without perforation or abscess without bleeding: Secondary | ICD-10-CM | POA: Diagnosis not present

## 2020-08-15 DIAGNOSIS — K219 Gastro-esophageal reflux disease without esophagitis: Secondary | ICD-10-CM | POA: Insufficient documentation

## 2020-08-15 DIAGNOSIS — Z888 Allergy status to other drugs, medicaments and biological substances status: Secondary | ICD-10-CM | POA: Insufficient documentation

## 2020-08-15 DIAGNOSIS — K635 Polyp of colon: Secondary | ICD-10-CM | POA: Diagnosis not present

## 2020-08-15 DIAGNOSIS — M810 Age-related osteoporosis without current pathological fracture: Secondary | ICD-10-CM | POA: Diagnosis not present

## 2020-08-15 DIAGNOSIS — F329 Major depressive disorder, single episode, unspecified: Secondary | ICD-10-CM | POA: Diagnosis not present

## 2020-08-15 DIAGNOSIS — D509 Iron deficiency anemia, unspecified: Secondary | ICD-10-CM | POA: Insufficient documentation

## 2020-08-15 DIAGNOSIS — Z79899 Other long term (current) drug therapy: Secondary | ICD-10-CM | POA: Insufficient documentation

## 2020-08-15 DIAGNOSIS — N183 Chronic kidney disease, stage 3 unspecified: Secondary | ICD-10-CM | POA: Insufficient documentation

## 2020-08-15 DIAGNOSIS — M059 Rheumatoid arthritis with rheumatoid factor, unspecified: Secondary | ICD-10-CM | POA: Diagnosis not present

## 2020-08-15 DIAGNOSIS — E669 Obesity, unspecified: Secondary | ICD-10-CM | POA: Diagnosis not present

## 2020-08-15 DIAGNOSIS — E785 Hyperlipidemia, unspecified: Secondary | ICD-10-CM | POA: Diagnosis not present

## 2020-08-15 DIAGNOSIS — N189 Chronic kidney disease, unspecified: Secondary | ICD-10-CM | POA: Diagnosis not present

## 2020-08-15 DIAGNOSIS — F418 Other specified anxiety disorders: Secondary | ICD-10-CM | POA: Diagnosis not present

## 2020-08-15 DIAGNOSIS — D122 Benign neoplasm of ascending colon: Secondary | ICD-10-CM | POA: Diagnosis not present

## 2020-08-15 DIAGNOSIS — F419 Anxiety disorder, unspecified: Secondary | ICD-10-CM | POA: Diagnosis not present

## 2020-08-15 DIAGNOSIS — Z6838 Body mass index (BMI) 38.0-38.9, adult: Secondary | ICD-10-CM | POA: Diagnosis not present

## 2020-08-15 HISTORY — DX: Fracture of unspecified parts of lumbosacral spine and pelvis, initial encounter for closed fracture: S32.9XXA

## 2020-08-15 HISTORY — DX: Low back pain, unspecified: M54.50

## 2020-08-15 HISTORY — DX: Deficiency of other specified B group vitamins: E53.8

## 2020-08-15 HISTORY — DX: Hyperlipidemia, unspecified: E78.5

## 2020-08-15 HISTORY — DX: Encounter for other specified aftercare: Z51.89

## 2020-08-15 HISTORY — PX: ESOPHAGOGASTRODUODENOSCOPY (EGD) WITH PROPOFOL: SHX5813

## 2020-08-15 HISTORY — DX: Chronic kidney disease, unspecified: N18.9

## 2020-08-15 HISTORY — DX: Chondrocostal junction syndrome (tietze): M94.0

## 2020-08-15 HISTORY — DX: Sleep disorder, unspecified: G47.9

## 2020-08-15 HISTORY — DX: Obesity, unspecified: E66.9

## 2020-08-15 HISTORY — DX: Other specified congenital deformities of hip: Q65.89

## 2020-08-15 HISTORY — DX: Iron deficiency anemia, unspecified: D50.9

## 2020-08-15 HISTORY — DX: Other specified abnormal findings of blood chemistry: R79.89

## 2020-08-15 HISTORY — PX: COLONOSCOPY WITH PROPOFOL: SHX5780

## 2020-08-15 HISTORY — DX: Anxiety disorder, unspecified: F41.9

## 2020-08-15 HISTORY — DX: Wedge compression fracture of unspecified thoracic vertebra, initial encounter for closed fracture: S22.000A

## 2020-08-15 HISTORY — DX: Headache, unspecified: R51.9

## 2020-08-15 HISTORY — DX: Other chronic pain: G89.29

## 2020-08-15 HISTORY — DX: Restless legs syndrome: G25.81

## 2020-08-15 HISTORY — DX: Raynaud's syndrome without gangrene: I73.00

## 2020-08-15 HISTORY — DX: Other seasonal allergic rhinitis: J30.2

## 2020-08-15 SURGERY — COLONOSCOPY WITH PROPOFOL
Anesthesia: General

## 2020-08-15 MED ORDER — PROPOFOL 10 MG/ML IV BOLUS
INTRAVENOUS | Status: DC | PRN
  Administered 2020-08-15: 30 mg via INTRAVENOUS
  Administered 2020-08-15 (×2): 20 mg via INTRAVENOUS
  Administered 2020-08-15: 80 mg via INTRAVENOUS
  Administered 2020-08-15: 20 mg via INTRAVENOUS

## 2020-08-15 MED ORDER — LIDOCAINE HCL (PF) 1 % IJ SOLN
INTRAMUSCULAR | Status: AC
  Filled 2020-08-15: qty 2

## 2020-08-15 MED ORDER — LIDOCAINE HCL (CARDIAC) PF 100 MG/5ML IV SOSY
PREFILLED_SYRINGE | INTRAVENOUS | Status: DC | PRN
  Administered 2020-08-15: 100 mg via INTRAVENOUS

## 2020-08-15 MED ORDER — PROPOFOL 500 MG/50ML IV EMUL
INTRAVENOUS | Status: DC | PRN
  Administered 2020-08-15: 100 ug/kg/min via INTRAVENOUS

## 2020-08-15 MED ORDER — PROPOFOL 10 MG/ML IV BOLUS
INTRAVENOUS | Status: AC
  Filled 2020-08-15: qty 20

## 2020-08-15 MED ORDER — PROPOFOL 500 MG/50ML IV EMUL
INTRAVENOUS | Status: AC
  Filled 2020-08-15: qty 50

## 2020-08-15 MED ORDER — SODIUM CHLORIDE 0.9 % IV SOLN
INTRAVENOUS | Status: DC
  Administered 2020-08-15: 1000 mL via INTRAVENOUS

## 2020-08-15 NOTE — Anesthesia Preprocedure Evaluation (Signed)
Anesthesia Evaluation  Patient identified by MRN, date of birth, ID band Patient awake    Reviewed: Allergy & Precautions, NPO status , Patient's Chart, lab work & pertinent test results  History of Anesthesia Complications Negative for: history of anesthetic complications  Airway Mallampati: III       Dental   Pulmonary neg sleep apnea, neg COPD, Not current smoker,           Cardiovascular hypertension, Pt. on medications (-) Past MI and (-) CHF (-) dysrhythmias (-) Valvular Problems/Murmurs     Neuro/Psych neg Seizures Anxiety Depression    GI/Hepatic Neg liver ROS, neg GERD  ,  Endo/Other  neg diabetes  Renal/GU Renal InsufficiencyRenal disease     Musculoskeletal   Abdominal   Peds  Hematology  (+) anemia ,   Anesthesia Other Findings   Reproductive/Obstetrics                             Anesthesia Physical Anesthesia Plan  ASA: III  Anesthesia Plan: General   Post-op Pain Management:    Induction: Intravenous  PONV Risk Score and Plan: 3 and Propofol infusion, TIVA and Treatment may vary due to age or medical condition  Airway Management Planned: Nasal Cannula  Additional Equipment:   Intra-op Plan:   Post-operative Plan:   Informed Consent: I have reviewed the patients History and Physical, chart, labs and discussed the procedure including the risks, benefits and alternatives for the proposed anesthesia with the patient or authorized representative who has indicated his/her understanding and acceptance.       Plan Discussed with:   Anesthesia Plan Comments:         Anesthesia Quick Evaluation

## 2020-08-15 NOTE — H&P (Signed)
Tonya Cummings [id number] 20-Sep-1951   HPI:  Recently identified iron deficiency anemia.  For upper and lower endoscopy. Tolerated prep well.   Medications Prior to Admission  Medication Sig Dispense Refill Last Dose  . ASCORBIC ACID PO Take by mouth daily.     . meclizine (ANTIVERT) 12.5 MG tablet Take 12.5 mg by mouth as needed for dizziness.     . Adalimumab (HUMIRA) 40 MG/0.8ML PSKT Inject 40 mg into the skin every 14 (fourteen) days.      . Calcium Carbonate-Vitamin D 600-400 MG-UNIT chew tablet Chew 1 tablet by mouth 2 (two) times daily.      . Cholecalciferol 50 MCG (2000 UT) CAPS Take 2,000 Units by mouth daily.     . cyclobenzaprine (FLEXERIL) 10 MG tablet Take 10 mg by mouth at bedtime.     . enalapril-hydrochlorothiazide (VASERETIC) 10-25 MG per tablet Take 1 tablet by mouth daily.     . ferrous sulfate 325 (65 FE) MG tablet Take by mouth.     Marland Kitchen FLUoxetine (PROZAC) 40 MG capsule Take 40 mg by mouth daily.     . folic acid (FOLVITE) 1 MG tablet Take 1 mg by mouth daily.     Marland Kitchen HYDROcodone-acetaminophen (NORCO/VICODIN) 5-325 MG tablet May take 1-2 tablets once a day  if more pain control is needed, 30 days, sixty tablets     . hydroxychloroquine (PLAQUENIL) 200 MG tablet Take 200 mg by mouth 2 (two) times daily.     . methotrexate (50 MG/ML) 1 g injection Inject 25 mg into the vein once a week.      . Multiple Vitamins-Minerals (MULTIVITAMIN ADULT PO) Take 1 tablet by mouth daily.      Marland Kitchen omeprazole (PRILOSEC) 40 MG capsule      . pramipexole (MIRAPEX) 0.125 MG tablet Take 1 tablet by mouth daily.     . rosuvastatin (CRESTOR) 5 MG tablet Take 5 mg by mouth every other day.      . traMADol (ULTRAM) 50 MG tablet Take 1 tablet (50 mg total) by mouth every 6 (six) hours as needed. 20 tablet 0   . traZODone (DESYREL) 50 MG tablet Take 1 tablet by mouth at bedtime.      Allergies  Allergen Reactions  . Gabapentin Other (See Comments)    Weight gain  . Ibuprofen Other (See Comments)     Headache   Past Medical History:  Diagnosis Date  . Anxiety   . Chronic kidney disease   . Chronic pain   . Chronic radicular pain of lower back   . Costochondritis   . Depression   . Encounter for blood transfusion   . Headache    migraines  . Hip dysplasia   . Hyperlipidemia   . Hypertension   . Iron deficiency anemia   . Low back pain   . Low vitamin B12 level   . Obesity   . Osteoporosis   . Pelvic fracture ([place])   . Raynaud's disease without gangrene   . Restless leg syndrome   . Rheumatoid aortitis   . Rheumatoid arthritis ([facility])    polyarthritis  . Rheumatoid arthritis ([place])   . Seasonal allergies   . Sleep disorder   . Thoracic compression fracture [facility])    Past Surgical History:  Procedure Laterality Date  . ABDOMINAL SURGERY    . APPENDECTOMY    . CARPAL TUNNEL RELEASE Bilateral   . CHOLECYSTECTOMY    . FRACTURE SURGERY  hip fracture   . FRACTURE SURGERY     pelvic fracture plate   . HIP SURGERY Left   . TUBAL LIGATION     Social History   Socioeconomic History  . Marital status: Married    Spouse name: Not on file  . Number of children: Not on file  . Years of education: Not on file  . Highest education level: Not on file  Occupational History  . Not on file  Tobacco Use  . Smoking status: Never Smoker  . Smokeless tobacco: Never Used  Vaping Use  . Vaping Use: Never used  Substance and Sexual Activity  . Alcohol use: No  . Drug use: No  . Sexual activity: Yes  Other Topics Concern  . Not on file  Social History Narrative  . Not on file   Social Determinants of Health   Financial Resource Strain:   . Difficulty of Paying Living Expenses: Not on file  Food Insecurity:   . Worried About Charity fundraiser in the Last Year: Not on file  . Ran Out of Food in the Last Year: Not on file  Transportation Needs:   . Lack of Transportation (Medical): Not on file  . Lack of Transportation (Non-Medical): Not on file  Physical  Activity:   . Days of Exercise per Week: Not on file  . Minutes of Exercise per Session: Not on file  Stress:   . Feeling of Stress : Not on file  Social Connections:   . Frequency of Communication with Friends and Family: Not on file  . Frequency of Social Gatherings with Friends and Family: Not on file  . Attends Religious Services: Not on file  . Active Member of Clubs or Organizations: Not on file  . Attends Archivist Meetings: Not on file  . Marital Status: Not on file  Intimate Partner Violence:   . Fear of Current or Ex-Partner: Not on file  . Emotionally Abused: Not on file  . Physically Abused: Not on file  . Sexually Abused: Not on file   Social History   Social History Narrative  . Not on file     ROS: Negative.     PE: HEENT: Negative. Lungs: Clear. Cardio: RR.  Assessment/Plan:  Proceed with planned upper and lower endoscopy.  Forest Gleason [facility] 08/15/2020

## 2020-08-15 NOTE — Op Note (Signed)
[facility] Gastroenterology Patient Name: Tonya Cummings Procedure Date: 08/15/2020 9:43 AM MRN: [id number] Account #: [id number] Date of Birth: 1951/10/16 Admit Type: Outpatient Age: 69 Room: [facility] ENDO ROOM 1 Gender: Female Note Status: Finalized Procedure:             Colonoscopy Indications:           Iron deficiency anemia Providers:             Robert Bellow, MD Referring MD:          Juluis Rainier (Referring MD) Medicines:             Monitored Anesthesia Care Complications:         No immediate complications. Procedure:             Pre-Anesthesia Assessment:                        - Prior to the procedure, a History and Physical was                         performed, and patient medications, allergies and                         sensitivities were reviewed. The patient's tolerance                         of previous anesthesia was reviewed.                        - The risks and benefits of the procedure and the                         sedation options and risks were discussed with the                         patient. All questions were answered and informed                         consent was obtained.                        After obtaining informed consent, the colonoscope was                         passed under direct vision. Throughout the procedure,                         the patient's blood pressure, pulse, and oxygen                         saturations were monitored continuously. The                         Colonoscope was introduced through the anus and                         advanced to the the cecum, identified by appendiceal                         orifice and ileocecal valve. The  colonoscopy was                         technically difficult and complex due to significant                         looping and a tortuous colon. Successful completion of                         the procedure was aided by applying abdominal                          pressure. The colonoscopy was technically difficult                         and complex due to significant looping and a tortuous                         colon. Successful completion of the procedure was                         aided by changing the patient to a prone position. Findings:      A 5 mm polyp was found in the proximal ascending colon. The polyp was       sessile. Biopsies were taken with a cold forceps for histology.      A few small-mouthed diverticula were found in the sigmoid colon.      The retroflexed view of the distal rectum and anal verge was normal and       showed no anal or rectal abnormalities. Impression:            - One 5 mm polyp in the proximal ascending colon.                         Biopsied.                        - Diverticulosis in the sigmoid colon.                        - The distal rectum and anal verge are normal on                         retroflexion view. Recommendation:        - Telephone endoscopist for pathology results in 1                         week. Procedure Code(s):     --- Professional ---                        [phone], Colonoscopy, flexible; with biopsy, single or                         multiple Diagnosis Code(s):     --- Professional ---                        K63.5, Polyp of colon  D50.9, Iron deficiency anemia, unspecified                        K57.30, Diverticulosis of large intestine without                         perforation or abscess without bleeding CPT copyright 2019 American [employer]. All rights reserved. The codes documented in this report are preliminary and upon coder review may  be revised to meet current compliance requirements. Robert Bellow, MD 08/15/2020 10:43:23 AM This report has been signed electronically. Number of Addenda: 0 Note Initiated On: 08/15/2020 9:43 AM Scope Withdrawal Time: 0 hours 8 minutes 58 seconds  Total Procedure Duration: 0 hours 31 minutes 19  seconds  Estimated Blood Loss:  Estimated blood loss was minimal.      [facility]

## 2020-08-15 NOTE — Anesthesia Postprocedure Evaluation (Signed)
Anesthesia Post Note  Patient: Tonya Cummings  Procedure(s) Performed: COLONOSCOPY WITH PROPOFOL (N/A ) ESOPHAGOGASTRODUODENOSCOPY (EGD) WITH PROPOFOL (N/A )  Patient location during evaluation: Endoscopy Anesthesia Type: General Level of consciousness: awake and alert Pain management: pain level controlled Vital Signs Assessment: post-procedure vital signs reviewed and stable Respiratory status: spontaneous breathing and respiratory function stable Cardiovascular status: stable Anesthetic complications: no   No complications documented.   Last Vitals:  Vitals:   08/15/20 1057 08/15/20 1107  BP: (!) 147/89 132/89  Pulse: 83 88  Resp: (!) 23 (!) 25  Temp:    SpO2: 98% 98%    Last Pain:  Vitals:   08/15/20 1107  TempSrc:   PainSc: 0-No pain                 Ruari Mudgett K

## 2020-08-15 NOTE — Op Note (Signed)
[facility]astroenterology Patient Name: Tonya Cummings Procedure Date: 08/15/2020 9:44 AM MRN: [id number] Account #: [id number] Date of Birth: December 24, 1950 Admit Type: Outpatient Age: 69 Room: [facility] ENDO ROOM 1 Gender: Female Note Status: Finalized Procedure:             Upper GI endoscopy Indications:           Iron deficiency anemia Providers:             Robert Bellow, MD Referring MD:          Juluis Rainier (Referring MD) Medicines:             Monitored Anesthesia Care Complications:         No immediate complications. Procedure:             Pre-Anesthesia Assessment:                        - Prior to the procedure, a History and Physical was                         performed, and patient medications, allergies and                         sensitivities were reviewed. The patient's tolerance                         of previous anesthesia was reviewed.                        - The risks and benefits of the procedure and the                         sedation options and risks were discussed with the                         patient. All questions were answered and informed                         consent was obtained.                        After obtaining informed consent, the endoscope was                         passed under direct vision. Throughout the procedure,                         the patient's blood pressure, pulse, and oxygen                         saturations were monitored continuously. The Endoscope                         was introduced through the mouth, and advanced to the                         third part of duodenum. The upper GI endoscopy was  accomplished without difficulty. The patient tolerated                         the procedure well. Findings:      The esophagus was normal.      The stomach was normal.      The examined duodenum was normal. Impression:            - Normal esophagus.                         - Normal stomach.                        - Normal examined duodenum.                        - No specimens collected. Recommendation:        - Perform a colonoscopy today. Procedure Code(s):     --- Professional ---                        [phone], Esophagogastroduodenoscopy, flexible,                         transoral; diagnostic, including collection of                         specimen(s) by brushing or washing, when performed                         (separate procedure) Diagnosis Code(s):     --- Professional ---                        D50.9, Iron deficiency anemia, unspecified CPT copyright 2019 American Medical Association. All rights reserved. The codes documented in this report are preliminary and upon coder review may  be revised to meet current compliance requirements. Robert Bellow, MD 08/15/2020 10:05:39 AM This report has been signed electronically. Number of Addenda: 0 Note Initiated On: 08/15/2020 9:44 AM      [facility]

## 2020-08-15 NOTE — Transfer of Care (Signed)
Immediate Anesthesia Transfer of Care Note  Patient: Tonya Cummings  Procedure(s) Performed: COLONOSCOPY WITH PROPOFOL (N/A ) ESOPHAGOGASTRODUODENOSCOPY (EGD) WITH PROPOFOL (N/A )  Patient Location: PACU  Anesthesia Type:MAC  Level of Consciousness: awake and drowsy  Airway & Oxygen Therapy: Patient Spontanous Breathing and Patient connected to face mask oxygen  Post-op Assessment: Post -op Vital signs reviewed and stable  Post vital signs: stable  Last Vitals:  Vitals Value Taken Time  BP 152/83 08/15/20 1047  Temp 36.6 C 08/15/20 1047  Pulse 88 08/15/20 1048  Resp 11 08/15/20 1048  SpO2 98 % 08/15/20 1048  Vitals shown include unvalidated device data.  Last Pain:  Vitals:   08/15/20 1047  TempSrc: Temporal  PainSc: Asleep         Complications: No complications documented.

## 2020-08-17 ENCOUNTER — Encounter: Payer: Self-pay | Admitting: General Surgery

## 2020-08-19 LAB — SURGICAL PATHOLOGY

## 2020-10-01 ENCOUNTER — Other Ambulatory Visit: Payer: Self-pay

## 2020-10-01 ENCOUNTER — Inpatient Hospital Stay: Payer: Medicare HMO | Attending: Hematology and Oncology

## 2020-10-01 DIAGNOSIS — Z8249 Family history of ischemic heart disease and other diseases of the circulatory system: Secondary | ICD-10-CM | POA: Insufficient documentation

## 2020-10-01 DIAGNOSIS — Z79899 Other long term (current) drug therapy: Secondary | ICD-10-CM | POA: Diagnosis not present

## 2020-10-01 DIAGNOSIS — E538 Deficiency of other specified B group vitamins: Secondary | ICD-10-CM | POA: Diagnosis not present

## 2020-10-01 DIAGNOSIS — Z808 Family history of malignant neoplasm of other organs or systems: Secondary | ICD-10-CM | POA: Insufficient documentation

## 2020-10-01 DIAGNOSIS — Z833 Family history of diabetes mellitus: Secondary | ICD-10-CM | POA: Diagnosis not present

## 2020-10-01 DIAGNOSIS — D509 Iron deficiency anemia, unspecified: Secondary | ICD-10-CM

## 2020-10-01 LAB — CBC WITH DIFFERENTIAL/PLATELET
Abs Immature Granulocytes: 0.04 10*3/uL (ref 0.00–0.07)
Basophils Absolute: 0 10*3/uL (ref 0.0–0.1)
Basophils Relative: 0 %
Eosinophils Absolute: 0.1 10*3/uL (ref 0.0–0.5)
Eosinophils Relative: 1 %
HCT: 31.8 % — ABNORMAL LOW (ref 36.0–46.0)
Hemoglobin: 10.1 g/dL — ABNORMAL LOW (ref 12.0–15.0)
Immature Granulocytes: 1 %
Lymphocytes Relative: 29 %
Lymphs Abs: 2.2 10*3/uL (ref 0.7–4.0)
MCH: 30.6 pg (ref 26.0–34.0)
MCHC: 31.8 g/dL (ref 30.0–36.0)
MCV: 96.4 fL (ref 80.0–100.0)
Monocytes Absolute: 1.1 10*3/uL — ABNORMAL HIGH (ref 0.1–1.0)
Monocytes Relative: 14 %
Neutro Abs: 4.2 10*3/uL (ref 1.7–7.7)
Neutrophils Relative %: 55 %
Platelets: 297 10*3/uL (ref 150–400)
RBC: 3.3 MIL/uL — ABNORMAL LOW (ref 3.87–5.11)
RDW: 16 % — ABNORMAL HIGH (ref 11.5–15.5)
WBC: 7.7 10*3/uL (ref 4.0–10.5)
nRBC: 0 % (ref 0.0–0.2)

## 2020-10-01 LAB — SEDIMENTATION RATE: Sed Rate: 47 mm/hr — ABNORMAL HIGH (ref 0–30)

## 2020-10-01 LAB — FERRITIN: Ferritin: 70 ng/mL (ref 11–307)

## 2020-10-01 NOTE — Progress Notes (Signed)
[facility]  [address], [address] [place], [place] [address] Phone: [phone]  Fax: [phone]   Clinic Day:  10/02/2020  Referring physician: [name], *  Chief Complaint: Tonya Cummings is a 69 y.o. female with rheumatoid arthritis and iron deficiency anemia who is seen for 4 month assessment.  HPI: The patient was last seen in the hematology clinic on 07/01/2020. At that time, she felt cold.  She denied any bleeding.  She had a torn meniscus and was planning on surgery. Hematocrit was 32.1, hemoglobin 10.1, MCV 93.9, platelets 245,000, WBC 7,900. Ferritin was 104 with an iron saturation of 16% and a TIBC of 270. She continued vitamin B12 500 mcg daily. She received Venofer.  Colonoscopy on 08/15/2020 by Dr. Bary Castilla revealed one 5 mm polyp in the proximal ascending colon (tubular adenoma). There was diverticulosis in the sigmoid colon. Upper endoscopy was normal.  The patient is scheduled for a right total knee replacement on 10/13/2020 by Dr. Marry Guan.  Labs on 07/22/2020 revealed a hematocrit of 31.3, hemoglobin 9.8, MCV 94.8, platelets 302,000, WBC 9,200. Ferritin was 124 with an iron saturation of 11% and a TIBC of 244. Sed rate was 51.  During the interim, she has been "good." She is excited and nervous about her upcoming knee surgery. She still reports sensitivity to light, shortness of breath on exertion, headaches, and insomnia. Her heartburn has resolved with omeprazole.  She does not like to eat red meat or leafy green vegetables. She has not been taking vitamin B12 because she ran out and forgot to pick more up. She will get more this weekend.   Past Medical History:  Diagnosis Date  . Anxiety   . Chronic kidney disease   . Chronic pain   . Chronic radicular pain of lower back   . Costochondritis   . Depression   . Encounter for blood transfusion   . Headache    migraines  . Hip dysplasia   . Hyperlipidemia   .  Hypertension   . Iron deficiency anemia   . Low back pain   . Low vitamin B12 level   . Obesity   . Osteoporosis   . Pelvic fracture ([place])   . Raynaud's disease without gangrene   . Restless leg syndrome   . Rheumatoid aortitis   . Rheumatoid arthritis (HCC)    polyarthritis  . Rheumatoid arthritis ([place])   . Seasonal allergies   . Sleep disorder   . Thoracic compression fracture [facility])     Past Surgical History:  Procedure Laterality Date  . ABDOMINAL SURGERY    . APPENDECTOMY    . CARPAL TUNNEL RELEASE Bilateral   . CHOLECYSTECTOMY    . COLONOSCOPY WITH PROPOFOL N/A 08/15/2020   Procedure: COLONOSCOPY WITH PROPOFOL;  Surgeon: Robert Bellow, MD;  Location: [facility] ENDOSCOPY;  Service: Endoscopy;  Laterality: N/A;  . ESOPHAGOGASTRODUODENOSCOPY (EGD) WITH PROPOFOL N/A 08/15/2020   Procedure: ESOPHAGOGASTRODUODENOSCOPY (EGD) WITH PROPOFOL;  Surgeon: Robert Bellow, MD;  Location: [facility] ENDOSCOPY;  Service: Endoscopy;  Laterality: N/A;  . FRACTURE SURGERY     hip fracture   . FRACTURE SURGERY     pelvic fracture plate   . HIP SURGERY Left   . TUBAL LIGATION      Family History  Problem Relation Age of Onset  . Diabetes Mother   . Hypertension Mother   . Aneurysm Father   . Diabetes Son   . Seizures Son   . Osteosarcoma  Brother   . Cancer Brother   . Diabetes Brother   . Diabetes Maternal Grandfather   . Heart disease Maternal Grandfather     Social History:  reports that she has never smoked. She has never used smokeless tobacco. She reports that she does not drink alcohol and does not use drugs. She has no known exposure to chemicals or radiation.Her husband's name is Herbie Baltimore. They have 3 children. She has been disabled since 2015. She previously worked in [employer]. She lives in [place] her husband, son, and eldest daughter. The patient is alone today.   Allergies:  Allergies  Allergen Reactions  . Gabapentin Other (See Comments)    Weight gain    . Ibuprofen Other (See Comments)    Headache    Current Medications: Current Outpatient Medications  Medication Sig Dispense Refill  . Acetaminophen-Caffeine (EXCEDRIN TENSION HEADACHE) 500-65 MG TABS Take 1 tablet by mouth daily as needed (Headache).    . Adalimumab (HUMIRA) 40 MG/0.4ML PSKT Inject 40 mg into the skin every 14 (fourteen) days.     . ASCORBIC ACID PO Take 1 tablet by mouth daily.     . Calcium Carbonate-Vitamin D 600-400 MG-UNIT chew tablet Chew 1 tablet by mouth 2 (two) times daily.     . Cholecalciferol 50 MCG (2000 UT) CAPS Take 2,000 Units by mouth daily.    . cyclobenzaprine (FLEXERIL) 10 MG tablet Take 10 mg by mouth at bedtime.    . enalapril-hydrochlorothiazide (VASERETIC) 10-25 MG per tablet Take 1 tablet by mouth daily.    Marland Kitchen FLUoxetine (PROZAC) 40 MG capsule Take 40 mg by mouth daily.    . folic acid (FOLVITE) 1 MG tablet Take 1 mg by mouth daily.    Marland Kitchen HYDROcodone-acetaminophen (NORCO/VICODIN) 5-325 MG tablet Take 1 tablet by mouth at bedtime as needed for moderate pain.     . hydroxychloroquine (PLAQUENIL) 200 MG tablet Take 200 mg by mouth 2 (two) times daily.    . methotrexate (50 MG/ML) 1 g injection Inject 25 mg into the vein once a week. .6 ml    . Multiple Vitamins-Minerals (MULTIVITAMIN ADULT PO) Take 1 tablet by mouth daily.     Marland Kitchen omeprazole (PRILOSEC) 40 MG capsule Take 40 mg by mouth 2 (two) times daily.     . pramipexole (MIRAPEX) 0.125 MG tablet Take 0.125 mg by mouth daily. Take 0.125 mg in the morning and 2 at bedtime    . Propylene Glycol (SYSTANE BALANCE) 0.6 % SOLN Place 1 drop into both eyes daily as needed (Dry eye).    . rosuvastatin (CRESTOR) 5 MG tablet Take 5 mg by mouth every other day.     . traZODone (DESYREL) 50 MG tablet Take 50 mg by mouth at bedtime.     . traMADol (ULTRAM) 50 MG tablet Take 1 tablet (50 mg total) by mouth every 6 (six) hours as needed. (Patient not taking: Reported on 09/29/2020) 20 tablet 0   No current  facility-administered medications for this visit.   Review of Systems  Constitutional: Negative for chills, diaphoresis, fever, malaise/fatigue and weight loss (up 1 lb).       Feels "good."  HENT: Negative.  Negative for congestion, ear discharge, ear pain, hearing loss, nosebleeds, sinus pain, sore throat and tinnitus.   Eyes: Positive for photophobia. Negative for blurred vision and double vision.  Respiratory: Positive for shortness of breath (with exertion). Negative for cough, hemoptysis and sputum production.   Cardiovascular: Negative.  Negative for chest  pain, palpitations and orthopnea.  Gastrointestinal: Negative for abdominal pain, blood in stool, constipation, diarrhea, heartburn (on omeprazole), melena, nausea and vomiting.       Does not eat iron-rich foods.  Genitourinary: Negative.  Negative for dysuria, flank pain, frequency, hematuria and urgency.  Musculoskeletal: Positive for joint pain (right knee, torn miniscus). Negative for back pain, falls, myalgias and neck pain.  Skin: Negative.  Negative for itching and rash.  Neurological: Positive for headaches. Negative for dizziness, tingling, tremors, sensory change, speech change, focal weakness, seizures and weakness.  Endo/Heme/Allergies: Negative.  Does not bruise/bleed easily.  Psychiatric/Behavioral: Negative for depression and memory loss. The patient has insomnia (wakes up 2-3 times). The patient is not nervous/anxious.   All other systems reviewed and are negative.  Performance status (ECOG):  1  Vitals Blood pressure (!) 158/66, pulse 83, temperature 98.3 F (36.8 C), temperature source Tympanic, weight 227 lb 15.3 oz (103.4 kg), SpO2 100 %.  Physical Exam Vitals and nursing note reviewed.  Constitutional:      General: She is not in acute distress.    Appearance: She is well-developed. She is not diaphoretic.     Interventions: Face mask in place.     Comments: Patient required assistance onto exam table.  She has a rolling walker by her side.  HENT:     Head: Normocephalic and atraumatic.     Comments: Long brown hair.    Mouth/Throat:     Mouth: Mucous membranes are moist.     Pharynx: Oropharynx is clear. No oropharyngeal exudate.  Eyes:     General: No scleral icterus.    Extraocular Movements: Extraocular movements intact.     Conjunctiva/sclera: Conjunctivae normal.     Pupils: Pupils are equal, round, and reactive to light.     Comments: Glasses.  Blue eyes.  Neck:     Vascular: No JVD.  Cardiovascular:     Rate and Rhythm: Normal rate and regular rhythm.     Heart sounds: Normal heart sounds. No murmur heard.  No gallop.   Pulmonary:     Effort: Pulmonary effort is normal. No respiratory distress.     Breath sounds: Normal breath sounds. No wheezing or rales.  Chest:     Chest wall: No tenderness.  Abdominal:     General: Bowel sounds are normal. There is no distension.     Palpations: Abdomen is soft. There is no mass.     Tenderness: There is no abdominal tenderness. There is no guarding or rebound.  Musculoskeletal:        General: No tenderness. Normal range of motion.     Cervical back: Normal range of motion and neck supple.  Lymphadenopathy:     Head:     Right side of head: No preauricular, posterior auricular or occipital adenopathy.     Left side of head: No preauricular, posterior auricular or occipital adenopathy.     Cervical: No cervical adenopathy.     Upper Body:     Right upper body: No supraclavicular adenopathy.     Left upper body: No supraclavicular adenopathy.     Lower Body: No right inguinal adenopathy. No left inguinal adenopathy.  Skin:    General: Skin is warm and dry.     Coloration: Skin is not pale.     Findings: No erythema or rash.  Neurological:     Mental Status: She is alert and oriented to person, place, and time.  Psychiatric:  Behavior: Behavior normal.        Thought Content: Thought content normal.    Appointment  on 10/01/2020  Component Date Value Ref Range Status  . Sed Rate 10/01/2020 47* 0 - 30 mm/hr Final   Performed at [facility], [address] [place], [place] [address]  . WBC 10/01/2020 7.7  4.0 - 10.5 K/uL Final  . RBC 10/01/2020 3.30* 3.87 - 5.11 MIL/uL Final  . Hemoglobin 10/01/2020 10.1* 12.0 - 15.0 g/dL Final  . HCT 10/01/2020 31.8* 36 - 46 % Final  . MCV 10/01/2020 96.4  80.0 - 100.0 fL Final  . MCH 10/01/2020 30.6  26.0 - 34.0 pg Final  . MCHC 10/01/2020 31.8  30.0 - 36.0 g/dL Final  . RDW 10/01/2020 16.0* 11.5 - 15.5 % Final  . Platelets 10/01/2020 297  150 - 400 K/uL Final  . nRBC 10/01/2020 0.0  0.0 - 0.2 % Final  . Neutrophils Relative % 10/01/2020 55  % Final  . Neutro Abs 10/01/2020 4.2  1.7 - 7.7 K/uL Final  . Lymphocytes Relative 10/01/2020 29  % Final  . Lymphs Abs 10/01/2020 2.2  0.7 - 4.0 K/uL Final  . Monocytes Relative 10/01/2020 14  % Final  . Monocytes Absolute 10/01/2020 1.1* 0.1 - 1.0 K/uL Final  . Eosinophils Relative 10/01/2020 1  % Final  . Eosinophils Absolute 10/01/2020 0.1  0.0 - 0.5 K/uL Final  . Basophils Relative 10/01/2020 0  % Final  . Basophils Absolute 10/01/2020 0.0  0.0 - 0.1 K/uL Final  . Immature Granulocytes 10/01/2020 1  % Final  . Abs Immature Granulocytes 10/01/2020 0.04  0.00 - 0.07 K/uL Final   Performed at [facility], [address], [place], [place] [address]  . Ferritin 10/01/2020 70  11 - 307 ng/mL Final   Performed at [facility], [place] [place],  [address]    Assessment:  Tonya Cummings is a 69 y.o. female withseropositive rheumatoid arthritisand a progressive normocytic anemia. She was initially diagnosed in 2000. She is currently on methotrexate SQ oncea week, Plaquenil 200 mg twice daily and Humira 40 mgeveryother week (since 05/2016). She previously received Morrie Sheldon (2014/2015-02/2016) and Enbrel 25 mg biweekly. Shetakes folic acid1 mg a day.   Work-up  on 09/07/2019 revealed a hematocrit of 26.2, hemoglobin 8.0, MCV 82.4, platelets 351,000, white count 7900 with an ANC of 5000.  Ferritin was 10 with an iron saturation of 6% and a TIBC of 428.  CRP and sed rate were levated.  B12 was 339 (low normal).  Normal studies included:  folate (> 100), TSH, and haptoglobin.  LDH was 209.  Retic was 1.9%.  She has iron deficiency.  She received Venofer weekly x 3 (10/03/2019 - 10/16/2019), x 2 (05/02/2020 - 05/07/2020), and 07/01/2020.  Ferritin has been followed:  10 on 09/07/2019, 16 on 09/26/2019, 19 on 10/03/2019, 191 on 10/23/2019, 40 on 12/24/2019, and 14 on 02/26/2020.  Colonoscopy on 08/15/2020 revealed one 5 mm polyp in the proximal ascending colon (tubular adenoma). There was diverticulosis in the sigmoid colon. EGD was normal.  She has B12 deficiency.  B12 was 339 on 09/07/2019.  She is on oral B12 500 mcg a day.  B12 was 604 on 10/23/2019.  Folate was > 100 on 09/07/2019.  She has stage III chronic kidney disease.Creatinine is 1.3 (CrCl 41 ml/min).  Symptomatically, she feels "fine".  She denies any bleeding.  She is scheduled for knee surgery on  10/13/2020.    Plan: 1.   Review labs from 10/01/2020. 2. Iron deficiency anemia           Symptomatically, she feels a fatigue associated with anemia.    Hematocrit 30.7.  Hemoglobin 9.5.  MCV 89.8 on 02/26/2020.              Ferritin 14 (low) with an iron saturation of 4% and TIBC of 378.  Hematocrit 32.1. hemoglobin 10.1.  MCV 93.9 on 06/30/2020.   Ferritin 104 with an iron saturation of 16% and a TIBC of 270.  Hematocrit 31.8.  Hemoglobin 10.1.  MCV 96.4 on 10/01/2020.   Ferritin 70.  Sed rate 47.  Ferritin goal 100.  Patient last received IV iron on 05/07/2020.  She denies any bleeding.  Review interval colonoscopy and EGD from 08/15/2020.    No evidence of bleeding.  Discuss Venofer for today x1 secondary to upcoming surgery and likely blood loss. 3.   B12 deficiency              B12 was 339 on 09/07/2019 and 604 on 10/23/2019.             B12 goal is 400.             She remains on B12 500 mcg a day.             Check B12 and folate annually.. 4.   Venofer today. 5.   RTC on 11/15 for MD assessment, labs (CBC, ferritin, iron studies, sed rate, B12, folate), and +/- Venofer.  I discussed the assessment and treatment plan with the patient.  The patient was provided an opportunity to ask questions and all were answered.  The patient agreed with the plan and demonstrated an understanding of the instructions.  The patient was advised to call back if the symptoms worsen or if the condition fails to improve as anticipated.   Lequita Asal, MD, PhD    10/02/2020, 2:36 PM  I, Mirian Mo Tufford, am acting as a Education administrator for [employer]. Mike Gip, MD.   I, Kahleb Mcclane C. Mike Gip, MD, have reviewed the above documentation for accuracy and completeness, and I agree with the above.

## 2020-10-02 ENCOUNTER — Inpatient Hospital Stay: Payer: Medicare HMO

## 2020-10-02 ENCOUNTER — Encounter: Payer: Self-pay | Admitting: Hematology and Oncology

## 2020-10-02 ENCOUNTER — Inpatient Hospital Stay: Payer: Medicare HMO | Admitting: Hematology and Oncology

## 2020-10-02 VITALS — BP 158/66 | HR 83 | Temp 98.3°F | Wt 228.0 lb

## 2020-10-02 VITALS — BP 131/80 | HR 82 | Temp 97.5°F

## 2020-10-02 DIAGNOSIS — D649 Anemia, unspecified: Secondary | ICD-10-CM

## 2020-10-02 DIAGNOSIS — Z808 Family history of malignant neoplasm of other organs or systems: Secondary | ICD-10-CM | POA: Diagnosis not present

## 2020-10-02 DIAGNOSIS — D509 Iron deficiency anemia, unspecified: Secondary | ICD-10-CM

## 2020-10-02 DIAGNOSIS — E538 Deficiency of other specified B group vitamins: Secondary | ICD-10-CM

## 2020-10-02 DIAGNOSIS — Z833 Family history of diabetes mellitus: Secondary | ICD-10-CM | POA: Diagnosis not present

## 2020-10-02 DIAGNOSIS — Z79899 Other long term (current) drug therapy: Secondary | ICD-10-CM | POA: Diagnosis not present

## 2020-10-02 DIAGNOSIS — Z8249 Family history of ischemic heart disease and other diseases of the circulatory system: Secondary | ICD-10-CM | POA: Diagnosis not present

## 2020-10-02 MED ORDER — SODIUM CHLORIDE 0.9 % IV SOLN: 200.0000 mg | Freq: Once | INTRAVENOUS | Status: DC

## 2020-10-02 MED ORDER — IRON SUCROSE 20 MG/ML IV SOLN
200.0000 mg | Freq: Once | INTRAVENOUS | Status: AC
  Administered 2020-10-02: 200 mg via INTRAVENOUS
  Filled 2020-10-02: qty 10

## 2020-10-02 MED ORDER — SODIUM CHLORIDE 0.9 % IV SOLN
Freq: Once | INTRAVENOUS | Status: AC
  Filled 2020-10-02: qty 250

## 2020-10-02 NOTE — Progress Notes (Signed)
Right knee pain ( 5) / knee surgery 10/13/2020

## 2020-10-02 NOTE — Discharge Instructions (Signed)
Instructions after Total Knee Replacement   Tonya Cummings, Jr., M.D.     Dept. of Orthopaedics & Sports Medicine  [facility]  [address]  [place], [place]  [address]  Phone: [phone]   Fax: [phone]    DIET: Drink plenty of non-alcoholic fluids. Resume your normal diet. Include foods high in fiber.  ACTIVITY:  You may use crutches or a walker with weight-bearing as tolerated, unless instructed otherwise. You may be weaned off of the walker or crutches by your Physical Therapist.  Do NOT place pillows under the knee. Anything placed under the knee could limit your ability to straighten the knee.   Continue doing gentle exercises. Exercising will reduce the pain and swelling, increase motion, and prevent muscle weakness.   Please continue to use the TED compression stockings for 6 weeks. You may remove the stockings at night, but should reapply them in the morning. Do not drive or operate any equipment until instructed.  WOUND CARE:  Continue to use the PolarCare or ice packs periodically to reduce pain and swelling. You may bathe or shower after the staples are removed at the first office visit following surgery.  MEDICATIONS: You may resume your regular medications. Please take the pain medication as prescribed on the medication. Do not take pain medication on an empty stomach. You have been given a prescription for a blood thinner (Lovenox or Coumadin). Please take the medication as instructed. (NOTE: After completing a 2 week course of Lovenox, take one Enteric-coated aspirin once a day. This along with elevation will help reduce the possibility of phlebitis in your operated leg.) Do not drive or drink alcoholic beverages when taking pain medications.  CALL THE OFFICE FOR: Temperature above 101 degrees Excessive bleeding or drainage on the dressing. Excessive swelling, coldness, or paleness of the toes. Persistent nausea and vomiting.  FOLLOW-UP:  You  should have an appointment to return to the office in 10-14 days after surgery. Arrangements have been made for continuation of Physical Therapy (either home therapy or outpatient therapy).   [facility] Department Directory         [email or web address]       [email or web address]          Cardiology  Appointments: [place] - [phone] [place] - [phone]  Endocrinology  Appointments: [place] - [phone] [place] - [phone]  Gastroenterology  Appointments: [place] - [phone] [place] - [phone]        General Surgery   Appointments: [place] - [phone]  Internal Medicine/Family Medicine  Appointments: [place] - [phone] [place] - [phone] [place] - [phone]  Metabolic and Weigh Loss Surgery  Appointments: [place] - [phone]        Neurology  Appointments: [place] - [phone] [place] - [phone]  Neurosurgery  Appointments: [place] - [phone]  Obstetrics & Gynecology  Appointments: [place] - [phone] [place] - [phone]        Pediatrics  Appointments: [place] - [phone] [place] - [phone]  Physiatry  Appointments: [place] -[phone]  Physical Therapy  Appointments: [place] - [phone] [place] - [phone]        Podiatry  Appointments: [place] - [phone] [place] - [phone]  Pulmonology  Appointments: [place] - [phone]  Rheumatology  Appointments: [place] - [phone]         Location: [facility]  [address] , [place]  [address]  [place] Location: [facility] [address] [place], [place]  [address]  [place] Location: [facility] [address] [place], [place]  [address]    

## 2020-10-02 NOTE — Progress Notes (Signed)
Pt and VS stable at discharge. No s/s of distress/reaction noted.

## 2020-10-03 ENCOUNTER — Encounter
Admission: RE | Admit: 2020-10-03 | Discharge: 2020-10-03 | Disposition: A | Discharge status: Home / Self Care | Payer: Medicare HMO | Source: Ambulatory Visit | Attending: Orthopedic Surgery | Admitting: Orthopedic Surgery

## 2020-10-03 ENCOUNTER — Other Ambulatory Visit: Payer: Self-pay

## 2020-10-03 DIAGNOSIS — Z01818 Encounter for other preprocedural examination: Secondary | ICD-10-CM | POA: Insufficient documentation

## 2020-10-03 DIAGNOSIS — Z22322 Carrier or suspected carrier of Methicillin resistant Staphylococcus aureus: Secondary | ICD-10-CM

## 2020-10-03 DIAGNOSIS — Z01810 Encounter for preprocedural cardiovascular examination: Secondary | ICD-10-CM | POA: Diagnosis not present

## 2020-10-03 HISTORY — DX: Gastro-esophageal reflux disease without esophagitis: K21.9

## 2020-10-03 HISTORY — DX: Carrier or suspected carrier of methicillin resistant Staphylococcus aureus: Z22.322

## 2020-10-03 LAB — APTT: aPTT: 34 seconds (ref 24–36)

## 2020-10-03 LAB — COMPREHENSIVE METABOLIC PANEL
ALT: 16 U/L (ref 0–44)
AST: 22 U/L (ref 15–41)
Albumin: 3.7 g/dL (ref 3.5–5.0)
Alkaline Phosphatase: 96 U/L (ref 38–126)
Anion gap: 12 (ref 5–15)
BUN: 25 mg/dL — ABNORMAL HIGH (ref 8–23)
CO2: 27 mmol/L (ref 22–32)
Calcium: 9.1 mg/dL (ref 8.9–10.3)
Chloride: 100 mmol/L (ref 98–111)
Creatinine, Ser: 1.42 mg/dL — ABNORMAL HIGH (ref 0.44–1.00)
GFR, Estimated: 41 mL/min — ABNORMAL LOW (ref >60)
Glucose, Bld: 84 mg/dL (ref 70–99)
Potassium: 4.1 mmol/L (ref 3.5–5.1)
Sodium: 139 mmol/L (ref 135–145)
Total Bilirubin: 0.5 mg/dL (ref 0.3–1.2)
Total Protein: 7.3 g/dL (ref 6.5–8.1)

## 2020-10-03 LAB — URINALYSIS, ROUTINE W REFLEX MICROSCOPIC
Bacteria, UA: NONE SEEN
Bilirubin Urine: NEGATIVE
Glucose, UA: NEGATIVE mg/dL
Hgb urine dipstick: NEGATIVE
Ketones, ur: NEGATIVE mg/dL
Nitrite: NEGATIVE
Protein, ur: NEGATIVE mg/dL
Specific Gravity, Urine: 1.017 (ref 1.005–1.030)
pH: 5 (ref 5.0–8.0)

## 2020-10-03 LAB — TYPE AND SCREEN
ABO/RH(D): O POS
Antibody Screen: NEGATIVE

## 2020-10-03 LAB — PROTIME-INR
INR: 1 (ref 0.8–1.2)
Prothrombin Time: 13 seconds (ref 11.4–15.2)

## 2020-10-03 LAB — CBC
HCT: 31.6 % — ABNORMAL LOW (ref 36.0–46.0)
Hemoglobin: 10.3 g/dL — ABNORMAL LOW (ref 12.0–15.0)
MCH: 30.9 pg (ref 26.0–34.0)
MCHC: 32.6 g/dL (ref 30.0–36.0)
MCV: 94.9 fL (ref 80.0–100.0)
Platelets: 281 10*3/uL (ref 150–400)
RBC: 3.33 MIL/uL — ABNORMAL LOW (ref 3.87–5.11)
RDW: 15.9 % — ABNORMAL HIGH (ref 11.5–15.5)
WBC: 10.6 10*3/uL — ABNORMAL HIGH (ref 4.0–10.5)
nRBC: 0 % (ref 0.0–0.2)

## 2020-10-03 LAB — SURGICAL PCR SCREEN
MRSA, PCR: POSITIVE — AB
Staphylococcus aureus: POSITIVE — AB

## 2020-10-03 LAB — SEDIMENTATION RATE: Sed Rate: 53 mm/hr — ABNORMAL HIGH (ref 0–30)

## 2020-10-03 LAB — C-REACTIVE PROTEIN: CRP: 1.1 mg/dL — ABNORMAL HIGH (ref <1.0)

## 2020-10-03 NOTE — Patient Instructions (Addendum)
Your procedure is scheduled on: Mon. 11/1 Report to Day Surgery. To find out your arrival time please call [phone] between 1PM - 3PM on Friday 10/29.  Remember: Instructions that are not followed completely may result in serious medical risk,  up to and including death, or upon the discretion of your surgeon and anesthesiologist your  surgery may need to be rescheduled.     _X__ 1. Do not eat food after midnight the night before your procedure.                 No chewing gum or hard candies. You may drink clear liquids up to 2 hours                 before you are scheduled to arrive for your surgery- DO not drink clear                 liquids within 2 hours of the start of your surgery.                 Clear Liquids include:  water, apple juice without pulp, clear Gatorade, G2 or                  Gatorade Zero (avoid Red/Purple/Blue), Black Coffee or Tea (Do not add                 anything to coffee or tea). ___x__2.   Complete the "Ensure Clear Pre-surgery Clear Carbohydrate Drink" provided to you, 2 hours before arrival. **If you       are diabetic you will be provided with an alternative drink, Gatorade Zero or G2.  __X__2.  On the morning of surgery brush your teeth with toothpaste and water, you                may rinse your mouth with mouthwash if you wish.  Do not swallow any toothpaste of mouthwash.     ___ 3.  No Alcohol for 24 hours before or after surgery.   ___ 4.  Do Not Smoke or use e-cigarettes For 24 Hours Prior to Your Surgery.                 Do not use any chewable tobacco products for at least 6 hours prior to                 Surgery.  ___  5.  Do not use any recreational drugs (marijuana, cocaine, heroin, ecstasy, MDMA or other)                For at least one week prior to your surgery.  Combination of these drugs with anesthesia                May have life threatening results.  ____  6.  Bring all medications with you on the day of surgery if instructed.    _x___  7.  Notify your doctor if there is any change in your medical condition      (cold, fever, infections).     Do not wear jewelry, make-up, hairpins, clips or nail polish. Do not wear lotions, powders, or perfumes. You may wear deodorant. Do not shave 48 hours prior to surgery. Do not bring valuables to the hospital.    [facility] is not responsible for any belongings or valuables.  Contacts, dentures or bridgework may not be worn into surgery. Leave your suitcase in the car. After surgery  it may be brought to your room. For patients admitted to the hospital, discharge time is determined by your treatment team.   Patients discharged the day of surgery will not be allowed to drive home.   Make arrangements for someone to be with you for the first 24 hours of your Same Day Discharge.    Please read over the following fact sheets that you were given:   Incentive Spirometer    _x___ Take these medicines the morning of surgery with A SIP OF WATER:    1. FLUoxetine (PROZAC) 40 MG capsule  2. omeprazole (PRILOSEC) 40 MG capsule night before and morning of surgery  3. rosuvastatin (CRESTOR) 5 MG tablet  If it is due the morning of surgery  4.pain medication if needed  5.  6.  ____ Fleet Enema (as directed)   __x__ Use CHG Soap (or wipes) as directed  ____ Use Benzoyl Peroxide Gel as instructed  ____ Use inhalers on the day of surgery  ____ Stop metformin 2 days prior to surgery    ____ Take 1/2 of usual insulin dose the night before surgery. No insulin the morning          of surgery.   ____ Stop Coumadin/Plavix/aspirin   __x__ Stop Anti-inflammatories   Tylenol only     __x__ Stop supplements until after surgery.  ASCORBIC ACID PO  ____ Bring C-Pap to the hospital.   Do not take Humira this week  If you have any questions regarding your pre-procedure instructions,  Please call Pre-admit Testing at [phone]

## 2020-10-03 NOTE — Progress Notes (Signed)
  [facility] Perioperative Services: Pre-Admission/Anesthesia Testing  Abnormal Lab Notification  Date: 10/03/20  Name: Tonya Cummings MRN:   [id number]  Re: Abnormal labs noted during PAT appointment  Provider Notified: Dereck Leep, MD and Tamala Julian, PA-C Notification mode: Routed and/or faxed via [place] of concern: Lab Results  Component Value Date   STAPHAUREUS POSITIVE (A) 10/03/2020   MRSAPCR POSITIVE (A) 10/03/2020    Notes: Patient is scheduled for a COMPUTER ASSISTED TOTAL KNEE ARTHROPLASTY - RNFA (Right Knee) on 10/13/2020. This is a Community education officer; no formal response required.   Honor Loh, MSN, APRN, FNP-C, CEN [facility]  Peri-operative Services Nurse Practitioner Phone: [phone] 10/03/20 3:12 PM

## 2020-10-05 LAB — URINE CULTURE
Culture: NO GROWTH
Special Requests: NORMAL

## 2020-10-06 DIAGNOSIS — D509 Iron deficiency anemia, unspecified: Secondary | ICD-10-CM | POA: Diagnosis not present

## 2020-10-06 DIAGNOSIS — K2950 Unspecified chronic gastritis without bleeding: Secondary | ICD-10-CM | POA: Diagnosis not present

## 2020-10-06 DIAGNOSIS — D369 Benign neoplasm, unspecified site: Secondary | ICD-10-CM | POA: Diagnosis not present

## 2020-10-07 DIAGNOSIS — M1711 Unilateral primary osteoarthritis, right knee: Secondary | ICD-10-CM | POA: Diagnosis not present

## 2020-10-08 DIAGNOSIS — E782 Mixed hyperlipidemia: Secondary | ICD-10-CM | POA: Diagnosis not present

## 2020-10-08 DIAGNOSIS — Z23 Encounter for immunization: Secondary | ICD-10-CM | POA: Diagnosis not present

## 2020-10-08 DIAGNOSIS — N1832 Chronic kidney disease, stage 3b: Secondary | ICD-10-CM | POA: Diagnosis not present

## 2020-10-08 DIAGNOSIS — G479 Sleep disorder, unspecified: Secondary | ICD-10-CM | POA: Diagnosis not present

## 2020-10-08 DIAGNOSIS — Z0000 Encounter for general adult medical examination without abnormal findings: Secondary | ICD-10-CM | POA: Diagnosis not present

## 2020-10-08 DIAGNOSIS — M069 Rheumatoid arthritis, unspecified: Secondary | ICD-10-CM | POA: Diagnosis not present

## 2020-10-08 DIAGNOSIS — I129 Hypertensive chronic kidney disease with stage 1 through stage 4 chronic kidney disease, or unspecified chronic kidney disease: Secondary | ICD-10-CM | POA: Diagnosis not present

## 2020-10-08 DIAGNOSIS — F418 Other specified anxiety disorders: Secondary | ICD-10-CM | POA: Diagnosis not present

## 2020-10-09 ENCOUNTER — Other Ambulatory Visit
Admission: RE | Admit: 2020-10-09 | Discharge: 2020-10-09 | Disposition: A | Discharge status: Home / Self Care | Payer: Medicare HMO | Source: Ambulatory Visit | Attending: Orthopedic Surgery | Admitting: Orthopedic Surgery

## 2020-10-09 ENCOUNTER — Other Ambulatory Visit: Payer: Self-pay

## 2020-10-09 DIAGNOSIS — Z20822 Contact with and (suspected) exposure to covid-19: Secondary | ICD-10-CM | POA: Diagnosis not present

## 2020-10-09 DIAGNOSIS — Z01812 Encounter for preprocedural laboratory examination: Secondary | ICD-10-CM | POA: Diagnosis not present

## 2020-10-10 LAB — SARS CORONAVIRUS 2 (TAT 6-24 HRS): SARS Coronavirus 2: NEGATIVE

## 2020-10-12 ENCOUNTER — Encounter: Payer: Self-pay | Admitting: Orthopedic Surgery

## 2020-10-12 DIAGNOSIS — E785 Hyperlipidemia, unspecified: Secondary | ICD-10-CM | POA: Insufficient documentation

## 2020-10-12 DIAGNOSIS — G43909 Migraine, unspecified, not intractable, without status migrainosus: Secondary | ICD-10-CM | POA: Insufficient documentation

## 2020-10-12 DIAGNOSIS — N183 Chronic kidney disease, stage 3 unspecified: Secondary | ICD-10-CM | POA: Insufficient documentation

## 2020-10-12 DIAGNOSIS — G2581 Restless legs syndrome: Secondary | ICD-10-CM | POA: Insufficient documentation

## 2020-10-12 DIAGNOSIS — I10 Essential (primary) hypertension: Secondary | ICD-10-CM | POA: Insufficient documentation

## 2020-10-12 NOTE — H&P (Signed)
ORTHOPAEDIC HISTORY & PHYSICAL Progress Notes [name], [place] - 10/07/2020 1:00 PM EDT [facility] Chief Complaint:   Chief Complaint  Patient presents with  . Knee Pain  H & P RIGHT KNEE   History of Present Illness:   Tonya Cummings is a 69 y.o. female that presents to clinic today for her preoperative history and evaluation. Patient presents with her husband. The patient is scheduled to undergo a right total knee arthroplasty on 10/13/20 by Dr. Marry Guan. Her pain began several years ago but has increased significantly over the last 6 months. The pain is located primarily along the lateral aspect of the knee. She describes her pain as worse with weightbearing. She reports associated swelling as well as some locking and some giving way of the knee. She denies associated numbness or tingling.   The patient's symptoms have progressed to the point that they decrease her quality of life. The patient has previously undergone conservative treatment including NSAIDS and injections to the knee without adequate control of her symptoms.  Denies history of blood clots, significant cardiac history, denies history of lumbar surgery. Of note, patient does have a history of surgery for an unstable pelvis in 2011 by a surgeon at [facility]. Patient also has a history of rheumatoid arthritis for which she takes methotrexate, hydroxychloroquine, and Humira.  Past Medical, Surgical, Family, Social History, Allergies, Medications:   Past Medical History:  Past Medical History:  Diagnosis Date  . Arthritis  . Chronic pain  . Chronic radicular pain of lower back 02/06/2018  . CKD (chronic kidney disease) stage 3, GFR 30-59 ml/min (CMS-HCC)  . Costochondritis  . Depression with anxiety  . Encounter for blood transfusion  . Hip dysplasia  right birth,external rotation right leg,chronic  . Hyperlipidemia  . Hypertension  . Iron deficiency anemia 09/12/2019  .  Low back pain  . Low vitamin B12 level 09/12/2019  . Migraine headache  . Obesity (BMI 30-39.9), unspecified  . Osteoporosis, post-menopausal  DEXA (07/03/09) - T-score lumbar spine -2.07; right hip -3.28. Bilateral sacral insufficiency and left acetabular rim fracture. Pelvic stabilization surgery (02/03/10).  . Pelvic fracture (CMS-HCC)  . Polyarthritis 2000  . Raynaud's disease without gangrene 02/23/2016  . Restless legs syndrome (RLS)  . Seasonal allergies  Has only been a problem since living in [place].  . Seropositive rheumatoid arthritis (CMS-HCC) 02/23/2016  . Sleep disorder 03/22/2018  . Thoracic compression fracture (CMS-HCC)  T11   Past Surgical History:  Past Surgical History:  Procedure Laterality Date  . ABDOMINAL SURGERY 02/1971  . APPENDECTOMY 03/192  . CARPAL TUNNEL RELEASE  bilat  . CHOLECYSTECTOMY  . COLONOSCOPY 08/15/2020  Tubular adenoma/Repeat 23yr/JWB  . EGD 08/15/2020  Normal EGD/No repeat/JWB  . FRACTURE SURGERY  . HIP FRACTURE SURGERY  . pelvic fracture plate  . TUBAL LIGATION   Current Medications:  Current Outpatient Medications  Medication Sig Dispense Refill  . HYDROcodone-acetaminophen (NORCO) 5-325 mg tablet Take by mouth  . adalimumab (HUMIRA,CF, PEN) 40 mg/0.4 mL pen injector kit Inject 40 mg subcutaneously every 14 (fourteen) days 6 each 1  . calcium carbonate-vitamin D3 600 mg(1,50101m -400 unit Cap 1 cap by mouth 2 times a day  . cholecalciferol (VITAMIN D3) 2,000 unit capsule 1 daily (Patient taking differently: Take 2,000 Units by mouth once daily.  )  . cyclobenzaprine (FLEXERIL) 10 MG tablet TAKE 1 TABLET EVERY DAY AS NEEDED FOR MUSCLE SPASM(S) 90 tablet 1  . enalapriL-hydrochlorothiazide (  VASERETIC) 10-25 mg tablet TAKE 1 TABLET EVERY DAY 90 tablet 1  . FLUoxetine (PROZAC) 40 MG capsule Take 1 capsule (40 mg total) by mouth once daily 90 capsule 1  . folic acid (FOLVITE) 1 MG tablet 1 tab daily for 90 days 90 tablet 4  .  hydrOXYchloroQUINE (PLAQUENIL) 200 mg tablet TAKE 1 TABLET TWICE DAILY 180 tablet 1  . insulin syringe-needle U-100 (ULTRA COMFORT INSULIN SYRINGE) 1 mL 29 gauge x 1/2" syringe USE ONCE A WEEK TO ADMINISTER METHOTREXATE 100 each 0  . methotrexate 25 mg/mL injection Take 0.6 ml once a week, 12 weeks 7.2 mL 4  . multivitamin capsule 1 cap by mouth daily  . omeprazole (PRILOSEC) 40 MG DR capsule Take 1 capsule (40 mg total) by mouth 2 (two) times daily before meals 60 capsule 11  . pramipexole (MIRAPEX) 0.125 MG tablet TAKE 1 TABLET IN THE MORNING AND 2 TABLETS IN THE EVENING AS DIRECTED. 270 tablet 1  . propylene glycoL (SYSTANE BALANCE) 0.6 % ophthalmic drops Apply to eye  . rosuvastatin (CRESTOR) 5 MG tablet TAKE 1 TABLET EVERY OTHER DAY 45 tablet 1  . traZODone (DESYREL) 50 MG tablet TAKE 2 TABLETS EVERY NIGHT 180 tablet 1   No current facility-administered medications for this visit.   Allergies:  Allergies  Allergen Reactions  . Gabapentin Other (See Comments)  Weight gain  . Ibuprofen Headache   Social History:  Social History   Socioeconomic History  . Marital status: Married  Spouse name: Herbie Baltimore  . Number of children: 3  . Years of education: 17  . Highest education level: High school graduate  Occupational History  . Occupation: Disabled in 2015  Comment: Worked in [employer]  Tobacco Use  . Smoking status: Never Smoker  . Smokeless tobacco: Never Used  Vaping Use  . Vaping Use: Never used  Substance and Sexual Activity  . Alcohol use: No  . Drug use: No  . Sexual activity: Yes  Partners: Male  Birth control/protection: Post-menopausal, Surgical  Other Topics Concern  . Not on file  Social History Narrative  2017: Young, 3-4 yo grandson, living w/ them currently.   Social Determinants of Health   Financial Resource Strain: Not on file  Food Insecurity: Not on file  Transportation Needs: Not on file  Physical Activity: Not on file  Stress: Not on  file  Social Connections: Not on file  Housing Stability: Not on file   Family History:  Family History  Problem Relation Age of Onset  . Diabetes type II Mother  . Diabetes Mother  . High blood pressure (Hypertension) Mother  . Cancer Brother  . Diabetes Brother  . Heart disease Maternal Grandfather  . Diabetes Maternal Grandfather  . Seizures Son  . Cancer Brother  . Diabetes Brother  . Diabetes Maternal Grandmother  . Rheum arthritis Neg Hx   Review of Systems:   A 10+ ROS was performed, reviewed, and the pertinent orthopaedic findings are documented in the HPI.   Physical Examination:   BP (!) 140/90  Ht 166.4 cm (5' 5.5")  Wt (!) 101.9 kg (224 lb 9.6 oz)  BMI 36.81 kg/m   Patient is a well-developed, well-nourished female in no acute distress. Patient has normal mood and affect. Patient is alert and oriented to person, place, and time.   HEENT: Atraumatic, normocephalic. Pupils equal and reactive to light. Extraocular motion intact. Noninjected sclera.  Cardiovascular: Regular rate and rhythm, with no murmurs, rubs, or gallops. Distal  pulses palpable.  Respiratory: Lungs clear to auscultation bilaterally.   Right Knee: Soft tissue swelling: mild Effusion: minimal Erythema: none Crepitance: mild Tenderness: lateral Alignment: relative valgus Mediolateral laxity: lateral pseudolaxity Posterior sag: negative Patellar tracking: Good tracking without evidence of subluxation or tilt Atrophy: Generalized quadriceps atrophy.  Quadriceps tone was fair to good. Range of motion: 0/16/112 degrees  Sensation intact over the saphenous, lateral sural cutaneous, superficial fibular, and deep fibular nerve distributions.  Tests Performed/Reviewed:  X-rays  Anteroposterior, lateral, and sunrise views of the right knee were obtained. Images reveal complete loss of lateral compartment joint space with bone-on-bone contact and subchondral sclerosis of the bone noted. Mild  to moderate loss of medial compartment joint space and mild loss of patellofemoral joint space are noted. No fractures or dislocations.  Impression:   ICD-10-CM  1. Primary osteoarthritis of right knee M17.11   Plan:   The patient has end-stage degenerative changes of the right knee. It was explained to the patient that the condition is progressive in nature. Having failed conservative treatment, the patient has elected to proceed with a total joint arthroplasty. The patient will undergo a total joint arthroplasty with Dr. Marry Guan. The risks of surgery, including blood clot and infection, were discussed with the patient. Measures to reduce these risks, including the use of anticoagulation, perioperative antibiotics, and early ambulation were discussed. The importance of postoperative physical therapy was discussed with the patient. The patient elects to proceed with surgery. The patient is instructed to stop all blood thinners prior to surgery. The patient is instructed to call the hospital the day before surgery to learn of the proper arrival time.   Contact our office with any questions or concerns. Follow up as indicated, or sooner should any new problems arise, if conditions worsen, or if they are otherwise concerned.   [name], PA-C [facility] [address] [place], [place] [address] Phone: [phone]  This note was generated in part with voice recognition software and I apologize for any typographical errors that were not detected and corrected.   Electronically signed by [name], PA at 10/07/2020 5:57 PM EDT

## 2020-10-13 ENCOUNTER — Inpatient Hospital Stay
Admission: RE | Admit: 2020-10-13 | Discharge: 2020-10-15 | DRG: 470 | Disposition: A | Discharge status: Home Health | Payer: Medicare HMO | Attending: Orthopedic Surgery | Admitting: Orthopedic Surgery

## 2020-10-13 ENCOUNTER — Encounter: Payer: Self-pay | Admitting: Orthopedic Surgery

## 2020-10-13 ENCOUNTER — Inpatient Hospital Stay: Payer: Medicare HMO | Admitting: Anesthesiology

## 2020-10-13 ENCOUNTER — Encounter
Admission: RE | Disposition: A | Discharge status: Home Health | Payer: Self-pay | Source: Home / Self Care | Attending: Orthopedic Surgery

## 2020-10-13 ENCOUNTER — Other Ambulatory Visit: Payer: Self-pay

## 2020-10-13 ENCOUNTER — Inpatient Hospital Stay: Payer: Medicare HMO

## 2020-10-13 ENCOUNTER — Inpatient Hospital Stay: Payer: Medicare HMO | Admitting: Urgent Care

## 2020-10-13 DIAGNOSIS — Z8249 Family history of ischemic heart disease and other diseases of the circulatory system: Secondary | ICD-10-CM | POA: Diagnosis not present

## 2020-10-13 DIAGNOSIS — D509 Iron deficiency anemia, unspecified: Secondary | ICD-10-CM | POA: Diagnosis not present

## 2020-10-13 DIAGNOSIS — M25761 Osteophyte, right knee: Secondary | ICD-10-CM | POA: Diagnosis present

## 2020-10-13 DIAGNOSIS — K219 Gastro-esophageal reflux disease without esophagitis: Secondary | ICD-10-CM | POA: Diagnosis present

## 2020-10-13 DIAGNOSIS — F418 Other specified anxiety disorders: Secondary | ICD-10-CM | POA: Diagnosis present

## 2020-10-13 DIAGNOSIS — G2581 Restless legs syndrome: Secondary | ICD-10-CM | POA: Diagnosis not present

## 2020-10-13 DIAGNOSIS — E669 Obesity, unspecified: Secondary | ICD-10-CM | POA: Diagnosis not present

## 2020-10-13 DIAGNOSIS — Z79891 Long term (current) use of opiate analgesic: Secondary | ICD-10-CM | POA: Diagnosis not present

## 2020-10-13 DIAGNOSIS — M810 Age-related osteoporosis without current pathological fracture: Secondary | ICD-10-CM | POA: Diagnosis present

## 2020-10-13 DIAGNOSIS — I7300 Raynaud's syndrome without gangrene: Secondary | ICD-10-CM | POA: Diagnosis present

## 2020-10-13 DIAGNOSIS — I129 Hypertensive chronic kidney disease with stage 1 through stage 4 chronic kidney disease, or unspecified chronic kidney disease: Secondary | ICD-10-CM | POA: Diagnosis present

## 2020-10-13 DIAGNOSIS — Z833 Family history of diabetes mellitus: Secondary | ICD-10-CM | POA: Diagnosis not present

## 2020-10-13 DIAGNOSIS — Z6839 Body mass index (BMI) 39.0-39.9, adult: Secondary | ICD-10-CM

## 2020-10-13 DIAGNOSIS — Z96659 Presence of unspecified artificial knee joint: Secondary | ICD-10-CM | POA: Diagnosis not present

## 2020-10-13 DIAGNOSIS — N183 Chronic kidney disease, stage 3 unspecified: Secondary | ICD-10-CM | POA: Diagnosis not present

## 2020-10-13 DIAGNOSIS — M069 Rheumatoid arthritis, unspecified: Secondary | ICD-10-CM | POA: Diagnosis present

## 2020-10-13 DIAGNOSIS — Z96651 Presence of right artificial knee joint: Secondary | ICD-10-CM | POA: Diagnosis not present

## 2020-10-13 DIAGNOSIS — Z79899 Other long term (current) drug therapy: Secondary | ICD-10-CM

## 2020-10-13 DIAGNOSIS — Z888 Allergy status to other drugs, medicaments and biological substances status: Secondary | ICD-10-CM

## 2020-10-13 DIAGNOSIS — M1711 Unilateral primary osteoarthritis, right knee: Secondary | ICD-10-CM | POA: Diagnosis not present

## 2020-10-13 DIAGNOSIS — Z886 Allergy status to analgesic agent status: Secondary | ICD-10-CM

## 2020-10-13 DIAGNOSIS — M25561 Pain in right knee: Secondary | ICD-10-CM | POA: Diagnosis present

## 2020-10-13 DIAGNOSIS — E785 Hyperlipidemia, unspecified: Secondary | ICD-10-CM | POA: Diagnosis present

## 2020-10-13 DIAGNOSIS — Z471 Aftercare following joint replacement surgery: Secondary | ICD-10-CM | POA: Diagnosis not present

## 2020-10-13 HISTORY — PX: KNEE ARTHROPLASTY: SHX992

## 2020-10-13 LAB — ABO/RH: ABO/RH(D): O POS

## 2020-10-13 SURGERY — ARTHROPLASTY, KNEE, TOTAL, USING IMAGELESS COMPUTER-ASSISTED NAVIGATION
Anesthesia: General | Site: Knee | Laterality: Right

## 2020-10-13 MED ORDER — ENALAPRIL MALEATE 10 MG PO TABS
10.0000 mg | ORAL_TABLET | Freq: Every day | ORAL | Status: DC
  Administered 2020-10-14 – 2020-10-15 (×2): 10 mg via ORAL
  Filled 2020-10-13 (×2): qty 1

## 2020-10-13 MED ORDER — FLUOXETINE HCL 20 MG PO CAPS
40.0000 mg | ORAL_CAPSULE | Freq: Every day | ORAL | Status: DC
  Administered 2020-10-14 – 2020-10-15 (×2): 40 mg via ORAL
  Filled 2020-10-13 (×2): qty 2

## 2020-10-13 MED ORDER — LACTATED RINGERS IV SOLN: INTRAVENOUS | Status: DC

## 2020-10-13 MED ORDER — ENOXAPARIN SODIUM 30 MG/0.3ML ~~LOC~~ SOLN
30.0000 mg | Freq: Two times a day (BID) | SUBCUTANEOUS | Status: DC
  Administered 2020-10-14 – 2020-10-15 (×3): 30 mg via SUBCUTANEOUS
  Filled 2020-10-13 (×3): qty 0.3

## 2020-10-13 MED ORDER — HYDROMORPHONE HCL 1 MG/ML IJ SOLN
INTRAMUSCULAR | Status: AC
  Filled 2020-10-13: qty 1

## 2020-10-13 MED ORDER — ASCORBIC ACID 500 MG PO TABS
500.0000 mg | ORAL_TABLET | Freq: Every day | ORAL | Status: DC
  Administered 2020-10-13 – 2020-10-15 (×3): 500 mg via ORAL
  Filled 2020-10-13 (×3): qty 1

## 2020-10-13 MED ORDER — PANTOPRAZOLE SODIUM 40 MG PO TBEC
40.0000 mg | DELAYED_RELEASE_TABLET | Freq: Two times a day (BID) | ORAL | Status: DC
  Administered 2020-10-13 – 2020-10-15 (×4): 40 mg via ORAL
  Filled 2020-10-13 (×4): qty 1

## 2020-10-13 MED ORDER — SENNOSIDES-DOCUSATE SODIUM 8.6-50 MG PO TABS
1.0000 | ORAL_TABLET | Freq: Two times a day (BID) | ORAL | Status: DC
  Administered 2020-10-13 – 2020-10-15 (×4): 1 via ORAL
  Filled 2020-10-13 (×4): qty 1

## 2020-10-13 MED ORDER — PROPOFOL 500 MG/50ML IV EMUL
INTRAVENOUS | Status: AC
  Filled 2020-10-13: qty 50

## 2020-10-13 MED ORDER — FOLIC ACID 1 MG PO TABS
1.0000 mg | ORAL_TABLET | Freq: Every day | ORAL | Status: DC
  Administered 2020-10-13 – 2020-10-15 (×3): 1 mg via ORAL
  Filled 2020-10-13 (×3): qty 1

## 2020-10-13 MED ORDER — HYDROMORPHONE HCL 1 MG/ML IJ SOLN
0.5000 mg | INTRAMUSCULAR | Status: DC | PRN
  Administered 2020-10-13: 0.5 mg via INTRAVENOUS

## 2020-10-13 MED ORDER — DEXAMETHASONE SODIUM PHOSPHATE 10 MG/ML IJ SOLN: 8.0000 mg | Freq: Once | INTRAMUSCULAR | Status: AC

## 2020-10-13 MED ORDER — HYDROMORPHONE HCL 1 MG/ML IJ SOLN: 0.5000 mg | INTRAMUSCULAR | Status: DC | PRN

## 2020-10-13 MED ORDER — CYCLOBENZAPRINE HCL 10 MG PO TABS
10.0000 mg | ORAL_TABLET | Freq: Every day | ORAL | Status: DC
  Administered 2020-10-13 – 2020-10-14 (×2): 10 mg via ORAL
  Filled 2020-10-13 (×2): qty 1

## 2020-10-13 MED ORDER — FENTANYL CITRATE (PF) 100 MCG/2ML IJ SOLN
25.0000 ug | INTRAMUSCULAR | Status: DC | PRN
  Administered 2020-10-13 (×2): 50 ug via INTRAVENOUS

## 2020-10-13 MED ORDER — PROPOFOL 10 MG/ML IV BOLUS
INTRAVENOUS | Status: DC | PRN
  Administered 2020-10-13: 20 mg via INTRAVENOUS
  Administered 2020-10-13: 40 mg via INTRAVENOUS
  Administered 2020-10-13: 30 mg via INTRAVENOUS

## 2020-10-13 MED ORDER — CHLORHEXIDINE GLUCONATE 0.12 % MT SOLN: 15.0000 mL | Freq: Once | OROMUCOSAL | Status: AC

## 2020-10-13 MED ORDER — ONDANSETRON HCL 4 MG/2ML IJ SOLN: 4.0000 mg | Freq: Four times a day (QID) | INTRAMUSCULAR | Status: DC | PRN

## 2020-10-13 MED ORDER — BISACODYL 10 MG RE SUPP: 10.0000 mg | Freq: Every day | RECTAL | Status: DC | PRN

## 2020-10-13 MED ORDER — METOCLOPRAMIDE HCL 10 MG PO TABS
10.0000 mg | ORAL_TABLET | Freq: Three times a day (TID) | ORAL | Status: AC
  Administered 2020-10-13 – 2020-10-15 (×7): 10 mg via ORAL
  Filled 2020-10-13 (×7): qty 1

## 2020-10-13 MED ORDER — FENTANYL CITRATE (PF) 100 MCG/2ML IJ SOLN
INTRAMUSCULAR | Status: AC
  Administered 2020-10-13: 50 ug via INTRAVENOUS
  Filled 2020-10-13: qty 2

## 2020-10-13 MED ORDER — CELECOXIB 200 MG PO CAPS
200.0000 mg | ORAL_CAPSULE | Freq: Two times a day (BID) | ORAL | Status: DC
  Administered 2020-10-13 – 2020-10-15 (×4): 200 mg via ORAL
  Filled 2020-10-13 (×4): qty 1

## 2020-10-13 MED ORDER — SODIUM CHLORIDE 0.9 % IR SOLN
Status: DC | PRN
  Administered 2020-10-13: 500 mL

## 2020-10-13 MED ORDER — BUPIVACAINE HCL (PF) 0.25 % IJ SOLN
INTRAMUSCULAR | Status: DC | PRN
  Administered 2020-10-13: 60 mL

## 2020-10-13 MED ORDER — FERROUS SULFATE 325 (65 FE) MG PO TABS
325.0000 mg | ORAL_TABLET | Freq: Two times a day (BID) | ORAL | Status: DC
  Administered 2020-10-14 – 2020-10-15 (×3): 325 mg via ORAL
  Filled 2020-10-13 (×3): qty 1

## 2020-10-13 MED ORDER — CHLORHEXIDINE GLUCONATE 0.12 % MT SOLN
OROMUCOSAL | Status: AC
  Administered 2020-10-13: 15 mL via OROMUCOSAL
  Filled 2020-10-13: qty 15

## 2020-10-13 MED ORDER — TRAZODONE HCL 50 MG PO TABS
50.0000 mg | ORAL_TABLET | Freq: Every day | ORAL | Status: DC
  Administered 2020-10-13 – 2020-10-14 (×2): 50 mg via ORAL
  Filled 2020-10-13 (×2): qty 1

## 2020-10-13 MED ORDER — SODIUM CHLORIDE 0.9 % IV SOLN
INTRAVENOUS | Status: DC | PRN
  Administered 2020-10-13: 30 ug/min via INTRAVENOUS

## 2020-10-13 MED ORDER — PRAMIPEXOLE DIHYDROCHLORIDE 0.25 MG PO TABS
0.1250 mg | ORAL_TABLET | Freq: Every day | ORAL | Status: DC
  Administered 2020-10-14 – 2020-10-15 (×2): 0.125 mg via ORAL
  Filled 2020-10-13 (×2): qty 0.5

## 2020-10-13 MED ORDER — TRANEXAMIC ACID-NACL 1000-0.7 MG/100ML-% IV SOLN
INTRAVENOUS | Status: AC
  Filled 2020-10-13: qty 100

## 2020-10-13 MED ORDER — CEFAZOLIN SODIUM-DEXTROSE 2-4 GM/100ML-% IV SOLN
INTRAVENOUS | Status: AC
  Filled 2020-10-13: qty 100

## 2020-10-13 MED ORDER — ALUM & MAG HYDROXIDE-SIMETH 200-200-20 MG/5ML PO SUSP: 30.0000 mL | ORAL | Status: DC | PRN

## 2020-10-13 MED ORDER — PHENOL 1.4 % MT LIQD
1.0000 | OROMUCOSAL | Status: DC | PRN
  Administered 2020-10-14 (×2): 1 via OROMUCOSAL
  Filled 2020-10-13 (×2): qty 177

## 2020-10-13 MED ORDER — ORAL CARE MOUTH RINSE: 15.0000 mL | Freq: Once | OROMUCOSAL | Status: AC

## 2020-10-13 MED ORDER — PROPOFOL 500 MG/50ML IV EMUL
INTRAVENOUS | Status: DC | PRN
  Administered 2020-10-13: 75 ug/kg/min via INTRAVENOUS

## 2020-10-13 MED ORDER — ACETAMINOPHEN 325 MG PO TABS: 325.0000 mg | ORAL_TABLET | Freq: Four times a day (QID) | ORAL | Status: DC | PRN

## 2020-10-13 MED ORDER — OXYCODONE HCL 5 MG PO TABS
5.0000 mg | ORAL_TABLET | ORAL | Status: DC | PRN
  Administered 2020-10-13: 10 mg via ORAL
  Administered 2020-10-14: 5 mg via ORAL
  Administered 2020-10-14 (×2): 10 mg via ORAL
  Administered 2020-10-14: 5 mg via ORAL
  Administered 2020-10-15: 10 mg via ORAL
  Administered 2020-10-15: 5 mg via ORAL
  Filled 2020-10-13 (×2): qty 2
  Filled 2020-10-13: qty 1
  Filled 2020-10-13: qty 2
  Filled 2020-10-13 (×2): qty 1
  Filled 2020-10-13: qty 2

## 2020-10-13 MED ORDER — BUPIVACAINE HCL (PF) 0.5 % IJ SOLN
INTRAMUSCULAR | Status: DC | PRN
  Administered 2020-10-13: 2.8 mL

## 2020-10-13 MED ORDER — ACETAMINOPHEN 160 MG/5ML PO SOLN
325.0000 mg | ORAL | Status: DC | PRN
  Filled 2020-10-13: qty 20.3

## 2020-10-13 MED ORDER — MIDAZOLAM HCL 5 MG/5ML IJ SOLN
INTRAMUSCULAR | Status: DC | PRN
  Administered 2020-10-13: 2 mg via INTRAVENOUS

## 2020-10-13 MED ORDER — HYDROCODONE-ACETAMINOPHEN 7.5-325 MG PO TABS
1.0000 | ORAL_TABLET | Freq: Once | ORAL | Status: DC | PRN
  Filled 2020-10-13: qty 1

## 2020-10-13 MED ORDER — CALCIUM CARBONATE-VITAMIN D 500-200 MG-UNIT PO TABS
1.0000 | ORAL_TABLET | Freq: Two times a day (BID) | ORAL | Status: DC
  Administered 2020-10-13 – 2020-10-15 (×4): 1 via ORAL
  Filled 2020-10-13 (×4): qty 1

## 2020-10-13 MED ORDER — SODIUM CHLORIDE 0.9 % IV SOLN
INTRAVENOUS | Status: DC | PRN
  Administered 2020-10-13: 60 mL

## 2020-10-13 MED ORDER — DIPHENHYDRAMINE HCL 12.5 MG/5ML PO ELIX: 12.5000 mg | ORAL_SOLUTION | ORAL | Status: DC | PRN

## 2020-10-13 MED ORDER — PRAMIPEXOLE DIHYDROCHLORIDE 0.25 MG PO TABS: 0.1250 mg | ORAL_TABLET | Freq: Every day | ORAL | Status: DC

## 2020-10-13 MED ORDER — ACETAMINOPHEN 325 MG PO TABS: 325.0000 mg | ORAL_TABLET | ORAL | Status: DC | PRN

## 2020-10-13 MED ORDER — ENALAPRIL-HYDROCHLOROTHIAZIDE 10-25 MG PO TABS: 1.0000 | ORAL_TABLET | Freq: Every day | ORAL | Status: DC

## 2020-10-13 MED ORDER — CEFAZOLIN SODIUM-DEXTROSE 2-4 GM/100ML-% IV SOLN
2.0000 g | INTRAVENOUS | Status: AC
  Administered 2020-10-13: 2 g via INTRAVENOUS

## 2020-10-13 MED ORDER — POLYVINYL ALCOHOL 1.4 % OP SOLN
1.0000 [drp] | Freq: Every day | OPHTHALMIC | Status: DC | PRN
  Filled 2020-10-13: qty 15

## 2020-10-13 MED ORDER — TRANEXAMIC ACID-NACL 1000-0.7 MG/100ML-% IV SOLN
1000.0000 mg | Freq: Once | INTRAVENOUS | Status: AC
  Administered 2020-10-13: 1000 mg via INTRAVENOUS

## 2020-10-13 MED ORDER — FLEET ENEMA 7-19 GM/118ML RE ENEM: 1.0000 | ENEMA | Freq: Once | RECTAL | Status: DC | PRN

## 2020-10-13 MED ORDER — MENTHOL 3 MG MT LOZG
1.0000 | LOZENGE | OROMUCOSAL | Status: DC | PRN
  Filled 2020-10-13 (×2): qty 9

## 2020-10-13 MED ORDER — SODIUM CHLORIDE 0.9 % IV SOLN: INTRAVENOUS | Status: DC

## 2020-10-13 MED ORDER — ACETAMINOPHEN 10 MG/ML IV SOLN
INTRAVENOUS | Status: AC
  Filled 2020-10-13: qty 100

## 2020-10-13 MED ORDER — VANCOMYCIN HCL 1500 MG/300ML IV SOLN
1500.0000 mg | Freq: Once | INTRAVENOUS | Status: AC
  Administered 2020-10-13: 1500 mg via INTRAVENOUS
  Filled 2020-10-13: qty 300

## 2020-10-13 MED ORDER — ADULT MULTIVITAMIN W/MINERALS CH
1.0000 | ORAL_TABLET | Freq: Every day | ORAL | Status: DC
  Administered 2020-10-13 – 2020-10-15 (×3): 1 via ORAL
  Filled 2020-10-13 (×3): qty 1

## 2020-10-13 MED ORDER — TRANEXAMIC ACID-NACL 1000-0.7 MG/100ML-% IV SOLN
1000.0000 mg | INTRAVENOUS | Status: AC
  Administered 2020-10-13: 1000 mg via INTRAVENOUS

## 2020-10-13 MED ORDER — CELECOXIB 200 MG PO CAPS
ORAL_CAPSULE | ORAL | Status: AC
  Administered 2020-10-13: 400 mg via ORAL
  Filled 2020-10-13: qty 2

## 2020-10-13 MED ORDER — ONDANSETRON HCL 4 MG/2ML IJ SOLN: 4.0000 mg | Freq: Once | INTRAMUSCULAR | Status: DC | PRN

## 2020-10-13 MED ORDER — DEXAMETHASONE SODIUM PHOSPHATE 10 MG/ML IJ SOLN
INTRAMUSCULAR | Status: AC
  Administered 2020-10-13: 8 mg via INTRAVENOUS
  Filled 2020-10-13: qty 1

## 2020-10-13 MED ORDER — FENTANYL CITRATE (PF) 100 MCG/2ML IJ SOLN
INTRAMUSCULAR | Status: AC
  Filled 2020-10-13: qty 2

## 2020-10-13 MED ORDER — SURGIPHOR WOUND IRRIGATION SYSTEM - OPTIME
TOPICAL | Status: DC | PRN
  Administered 2020-10-13: 450 mL

## 2020-10-13 MED ORDER — MAGNESIUM HYDROXIDE 400 MG/5ML PO SUSP
30.0000 mL | Freq: Every day | ORAL | Status: DC
  Administered 2020-10-13 – 2020-10-15 (×3): 30 mL via ORAL
  Filled 2020-10-13 (×3): qty 30

## 2020-10-13 MED ORDER — CEFAZOLIN SODIUM-DEXTROSE 2-4 GM/100ML-% IV SOLN
2.0000 g | Freq: Four times a day (QID) | INTRAVENOUS | Status: AC
  Administered 2020-10-13 – 2020-10-14 (×2): 2 g via INTRAVENOUS
  Filled 2020-10-13 (×2): qty 100

## 2020-10-13 MED ORDER — HYDROCHLOROTHIAZIDE 25 MG PO TABS
25.0000 mg | ORAL_TABLET | Freq: Every day | ORAL | Status: DC
  Administered 2020-10-14 – 2020-10-15 (×2): 25 mg via ORAL
  Filled 2020-10-13 (×2): qty 1

## 2020-10-13 MED ORDER — ENSURE PRE-SURGERY PO LIQD
296.0000 mL | Freq: Once | ORAL | Status: DC
  Filled 2020-10-13: qty 296

## 2020-10-13 MED ORDER — ACETAMINOPHEN 10 MG/ML IV SOLN
1000.0000 mg | Freq: Four times a day (QID) | INTRAVENOUS | Status: AC
  Administered 2020-10-13 – 2020-10-14 (×3): 1000 mg via INTRAVENOUS
  Filled 2020-10-13 (×3): qty 100

## 2020-10-13 MED ORDER — MIDAZOLAM HCL 2 MG/2ML IJ SOLN
INTRAMUSCULAR | Status: AC
  Filled 2020-10-13: qty 2

## 2020-10-13 MED ORDER — PRAMIPEXOLE DIHYDROCHLORIDE 0.25 MG PO TABS
0.2500 mg | ORAL_TABLET | Freq: Every day | ORAL | Status: DC
  Administered 2020-10-13 – 2020-10-14 (×2): 0.25 mg via ORAL
  Filled 2020-10-13 (×3): qty 1

## 2020-10-13 MED ORDER — TRAMADOL HCL 50 MG PO TABS
50.0000 mg | ORAL_TABLET | ORAL | Status: DC | PRN
  Administered 2020-10-14: 50 mg via ORAL
  Filled 2020-10-13: qty 1

## 2020-10-13 MED ORDER — CHLORHEXIDINE GLUCONATE 4 % EX LIQD: 60.0000 mL | Freq: Once | CUTANEOUS | Status: DC

## 2020-10-13 MED ORDER — DROPERIDOL 2.5 MG/ML IJ SOLN
0.6250 mg | Freq: Once | INTRAMUSCULAR | Status: DC | PRN
  Filled 2020-10-13: qty 2

## 2020-10-13 MED ORDER — HYDROXYCHLOROQUINE SULFATE 200 MG PO TABS
200.0000 mg | ORAL_TABLET | Freq: Two times a day (BID) | ORAL | Status: DC
  Administered 2020-10-13 – 2020-10-15 (×4): 200 mg via ORAL
  Filled 2020-10-13 (×4): qty 1

## 2020-10-13 MED ORDER — ROSUVASTATIN CALCIUM 5 MG PO TABS
5.0000 mg | ORAL_TABLET | ORAL | Status: DC
  Administered 2020-10-13 – 2020-10-15 (×2): 5 mg via ORAL
  Filled 2020-10-13 (×2): qty 1

## 2020-10-13 MED ORDER — CELECOXIB 200 MG PO CAPS: 400.0000 mg | ORAL_CAPSULE | Freq: Once | ORAL | Status: AC

## 2020-10-13 MED ORDER — ONDANSETRON HCL 4 MG PO TABS: 4.0000 mg | ORAL_TABLET | Freq: Four times a day (QID) | ORAL | Status: DC | PRN

## 2020-10-13 MED ORDER — VITAMIN D 25 MCG (1000 UNIT) PO TABS
2000.0000 [IU] | ORAL_TABLET | Freq: Every day | ORAL | Status: DC
  Administered 2020-10-13 – 2020-10-15 (×3): 2000 [IU] via ORAL
  Filled 2020-10-13 (×3): qty 2

## 2020-10-13 SURGICAL SUPPLY — 84 items
ATTUNE PS FEM RT SZ 4 CEM KNEE (Femur) ×2 IMPLANT
ATTUNE PSRP INSR SZ4 5 KNEE (Insert) ×1 IMPLANT
ATTUNE PSRP INSR SZ4 5MM KNEE (Insert) ×1 IMPLANT
BASEPLATE TIBIAL ROTATING SZ 4 (Knees) ×2 IMPLANT
BATTERY INSTRU NAVIGATION (MISCELLANEOUS) ×12 IMPLANT
BLADE SAW 70X12.5 (BLADE) ×3 IMPLANT
BLADE SAW 90X13X1.19 OSCILLAT (BLADE) ×3 IMPLANT
BLADE SAW 90X25X1.19 OSCILLAT (BLADE) ×3 IMPLANT
BONE CEMENT GENTAMICIN (Cement) ×6 IMPLANT
CANISTER PREVENA PLUS 150 (CANNISTER) ×1 IMPLANT
CANISTER SUCT 3000ML PPV (MISCELLANEOUS) ×3 IMPLANT
CEMENT BONE GENTAMICIN 40 (Cement) IMPLANT
COOLER POLAR GLACIER W/PUMP (MISCELLANEOUS) ×3 IMPLANT
COVER WAND RF STERILE (DRAPES) ×3 IMPLANT
CUFF TOURN SGL QUICK 24 (TOURNIQUET CUFF)
CUFF TOURN SGL QUICK 30 (TOURNIQUET CUFF) ×2
CUFF TRNQT CYL 24X4X16.5-23 (TOURNIQUET CUFF) IMPLANT
CUFF TRNQT CYL 30X4X21-28X (TOURNIQUET CUFF) IMPLANT
DRAPE 3/4 80X56 (DRAPES) ×3 IMPLANT
DRSG DERMACEA 8X12 NADH (GAUZE/BANDAGES/DRESSINGS) ×3 IMPLANT
DRSG MEPILEX SACRM 8.7X9.8 (GAUZE/BANDAGES/DRESSINGS) ×3 IMPLANT
DRSG OPSITE POSTOP 4X14 (GAUZE/BANDAGES/DRESSINGS) ×3 IMPLANT
DRSG TEGADERM 4X4.75 (GAUZE/BANDAGES/DRESSINGS) ×3 IMPLANT
DURAPREP 26ML APPLICATOR (WOUND CARE) ×4 IMPLANT
ELECT REM PT RETURN 9FT ADLT (ELECTROSURGICAL) ×3
ELECTRODE REM PT RTRN 9FT ADLT (ELECTROSURGICAL) ×1 IMPLANT
EX-PIN ORTHOLOCK NAV 4X150 (PIN) ×6 IMPLANT
GLOVE BIO SURGEON STRL SZ7.5 (GLOVE) ×6 IMPLANT
GLOVE BIOGEL M STRL SZ7.5 (GLOVE) ×6 IMPLANT
GLOVE BIOGEL PI IND STRL 6.5 (GLOVE) IMPLANT
GLOVE BIOGEL PI IND STRL 7.5 (GLOVE) ×1 IMPLANT
GLOVE BIOGEL PI INDICATOR 6.5 (GLOVE) ×2
GLOVE BIOGEL PI INDICATOR 7.5 (GLOVE) ×2
GLOVE INDICATOR 8.0 STRL GRN (GLOVE) ×3 IMPLANT
GOWN STRL REUS W/ TWL LRG LVL3 (GOWN DISPOSABLE) ×2 IMPLANT
GOWN STRL REUS W/ TWL XL LVL3 (GOWN DISPOSABLE) ×1 IMPLANT
GOWN STRL REUS W/TWL LRG LVL3 (GOWN DISPOSABLE) ×4
GOWN STRL REUS W/TWL XL LVL3 (GOWN DISPOSABLE) ×2
HEMOVAC 400CC 10FR (MISCELLANEOUS) ×3 IMPLANT
HOLDER FOLEY CATH W/STRAP (MISCELLANEOUS) ×3 IMPLANT
HOOD PEEL AWAY FLYTE STAYCOOL (MISCELLANEOUS) ×6 IMPLANT
IRRIGATION SURGIPHOR STRL (IV SOLUTION) ×3 IMPLANT
KIT PREVENA INCISION MGT20CM45 (CANNISTER) ×1 IMPLANT
KIT PUMP PREVENA PLUS 14DAY (MISCELLANEOUS) ×1 IMPLANT
KIT TURNOVER KIT A (KITS) ×3 IMPLANT
KNIFE SCULPS 14X20 (INSTRUMENTS) ×3 IMPLANT
LABEL OR SOLS (LABEL) ×3 IMPLANT
MANIFOLD NEPTUNE II (INSTRUMENTS) ×3 IMPLANT
NDL SAFETY ECLIPSE 18X1.5 (NEEDLE) ×1 IMPLANT
NDL SPNL 20GX3.5 QUINCKE YW (NEEDLE) ×2 IMPLANT
NEEDLE HYPO 18GX1.5 SHARP (NEEDLE) ×2
NEEDLE SPNL 20GX3.5 QUINCKE YW (NEEDLE) ×6 IMPLANT
NS IRRIG 500ML POUR BTL (IV SOLUTION) ×3 IMPLANT
PACK TOTAL KNEE (MISCELLANEOUS) ×3 IMPLANT
PAD WRAPON POLAR KNEE (MISCELLANEOUS) ×1 IMPLANT
PAD WRAPON POLOR MULTI XL (MISCELLANEOUS) IMPLANT
PATELLA MEDIAL ATTUN 35MM KNEE (Knees) ×2 IMPLANT
PENCIL SMOKE EVACUATOR COATED (MISCELLANEOUS) ×1 IMPLANT
PENCIL SMOKE ULTRAEVAC 22 CON (MISCELLANEOUS) ×3 IMPLANT
PIN DRILL QUICK PACK ×3 IMPLANT
PIN FIXATION 1/8DIA X 3INL (PIN) ×9 IMPLANT
PULSAVAC PLUS IRRIG FAN TIP (DISPOSABLE) ×3
SOL .9 NS 3000ML IRR  AL (IV SOLUTION) ×2
SOL .9 NS 3000ML IRR UROMATIC (IV SOLUTION) ×1 IMPLANT
SOL PREP PVP 2OZ (MISCELLANEOUS) ×3
SOLUTION PREP PVP 2OZ (MISCELLANEOUS) ×1 IMPLANT
SPONGE DRAIN TRACH 4X4 STRL 2S (GAUZE/BANDAGES/DRESSINGS) ×3 IMPLANT
STAPLER SKIN PROX 35W (STAPLE) ×3 IMPLANT
STOCKINETTE IMPERV 14X48 (MISCELLANEOUS) IMPLANT
STRAP TIBIA SHORT (MISCELLANEOUS) ×3 IMPLANT
SUCTION FRAZIER HANDLE 10FR (MISCELLANEOUS) ×2
SUCTION TUBE FRAZIER 10FR DISP (MISCELLANEOUS) ×1 IMPLANT
SUT VIC AB 0 CT1 36 (SUTURE) ×6 IMPLANT
SUT VIC AB 1 CT1 36 (SUTURE) ×6 IMPLANT
SUT VIC AB 2-0 CT2 27 (SUTURE) ×3 IMPLANT
SYR 20ML LL LF (SYRINGE) ×3 IMPLANT
SYR 30ML LL (SYRINGE) ×6 IMPLANT
TIP FAN IRRIG PULSAVAC PLUS (DISPOSABLE) ×1 IMPLANT
TOWEL OR 17X26 4PK STRL BLUE (TOWEL DISPOSABLE) ×3 IMPLANT
TOWER CARTRIDGE SMART MIX (DISPOSABLE) ×3 IMPLANT
TRAY FOLEY MTR SLVR 16FR STAT (SET/KITS/TRAYS/PACK) ×3 IMPLANT
WRAP-ON POLOR PAD MULTI XL (MISCELLANEOUS) ×1
WRAPON POLAR PAD KNEE (MISCELLANEOUS)
WRAPON POLOR PAD MULTI XL (MISCELLANEOUS) ×2

## 2020-10-13 NOTE — Op Note (Signed)
OPERATIVE NOTE  DATE OF SURGERY:  10/13/2020  PATIENT NAME:  Tonya Cummings   DOB: [birth year]  MRN: [id number]  PRE-OPERATIVE DIAGNOSIS: Degenerative arthrosis of the right knee, primary  POST-OPERATIVE DIAGNOSIS:  Same  PROCEDURE:  Right total knee arthroplasty using computer-assisted navigation  SURGEON:  Marciano Sequin. M.D.  ANESTHESIA: spinal  ESTIMATED BLOOD LOSS: 50 mL  FLUIDS REPLACED: 800 mL of crystalloid  TOURNIQUET TIME: 95 minutes  DRAINS: 2 medium Hemovac  SOFT TISSUE RELEASES: Anterior cruciate ligament, posterior cruciate ligament, deep medial collateral ligament, patellofemoral ligament, posterolateral corner  IMPLANTS UTILIZED: DePuy Attune size 4 posterior stabilized femoral component (cemented), size 4 rotating platform tibial component (cemented), 35 mm medialized dome patella (cemented), and a 5 mm stabilized rotating platform polyethylene insert.  INDICATIONS FOR SURGERY: Tonya Cummings is a 69 y.o. year old female with a long history of progressive knee pain. X-rays demonstrated severe degenerative changes in tricompartmental fashion. The patient had not seen any significant improvement despite conservative nonsurgical intervention. After discussion of the risks and benefits of surgical intervention, the patient expressed understanding of the risks benefits and agree with plans for total knee arthroplasty.   The risks, benefits, and alternatives were discussed at length including but not limited to the risks of infection, bleeding, nerve injury, stiffness, blood clots, the need for revision surgery, cardiopulmonary complications, among others, and they were willing to proceed.  PROCEDURE IN DETAIL: The patient was brought into the operating room and, after adequate spinal anesthesia was achieved, a tourniquet was placed on the patient's upper thigh. The patient's knee and leg were cleaned and prepped with alcohol and DuraPrep and draped in the usual  sterile fashion. A "timeout" was performed as per usual protocol. The lower extremity was exsanguinated using an Esmarch, and the tourniquet was inflated to 300 mmHg. An anterior longitudinal incision was made followed by a standard mid vastus approach. The deep fibers of the medial collateral ligament were elevated in a subperiosteal fashion off of the medial flare of the tibia so as to maintain a continuous soft tissue sleeve. The patella was subluxed laterally and the patellofemoral ligament was incised. Inspection of the knee demonstrated severe degenerative changes with full-thickness loss of articular cartilage. Osteophytes were debrided using a rongeur. Anterior and posterior cruciate ligaments were excised. Two 4.0 mm Schanz pins were inserted in the femur and into the tibia for attachment of the array of trackers used for computer-assisted navigation. Hip center was identified using a circumduction technique. Distal landmarks were mapped using the computer. The distal femur and proximal tibia were mapped using the computer. The distal femoral cutting guide was positioned using computer-assisted navigation so as to achieve a 5 distal valgus cut. The femur was sized and it was felt that a size 4 femoral component was appropriate. A size 4 femoral cutting guide was positioned and the anterior cut was performed and verified using the computer. This was followed by completion of the posterior and chamfer cuts. Femoral cutting guide for the central box was then positioned in the center box cut was performed.  Attention was then directed to the proximal tibia. Medial and lateral menisci were excised. The extramedullary tibial cutting guide was positioned using computer-assisted navigation so as to achieve a 0 varus-valgus alignment and 3 posterior slope. The cut was performed and verified using the computer. The proximal tibia was sized and it was felt that a size 4 tibial tray was appropriate. Tibial and  femoral trials were inserted  followed by insertion of a 5 mm polyethylene insert. The knee was felt to be tight laterally.  The trial components were removed and the knee was brought into full extension and distracted using the Moreland retractors.  The posterolateral corner was carefully released using a combination of electrocautery and Metzenbaum scissors.  Trial components were reinserted followed by placement of a 5 mm polyethylene trial.  This allowed for excellent mediolateral soft tissue balancing both in flexion and in full extension. Finally, the patella was cut and prepared so as to accommodate a 35 mm medialized dome patella. A patella trial was placed and the knee was placed through a range of motion with excellent patellar tracking appreciated. The femoral trial was removed after debridement of posterior osteophytes. The central post-hole for the tibial component was reamed followed by insertion of a keel punch. Tibial trials were then removed. Cut surfaces of bone were irrigated with copious amounts of normal saline using pulsatile lavage and then suctioned dry. Polymethylmethacrylate cement with gentamicin was prepared in the usual fashion using a vacuum mixer. Cement was applied to the cut surface of the proximal tibia as well as along the undersurface of a size 4 rotating platform tibial component. Tibial component was positioned and impacted into place. Excess cement was removed using Civil Service fast streamer. Cement was then applied to the cut surfaces of the femur as well as along the posterior flanges of the size 4 femoral component. The femoral component was positioned and impacted into place. Excess cement was removed using [employer] fast streamer. A 5 mm polyethylene trial was inserted and the knee was brought into full extension with steady axial compression applied. Finally, cement was applied to the backside of a 35 mm medialized dome patella and the patellar component was positioned and patellar clamp  applied. Excess cement was removed using [employer] fast streamer. After adequate curing of the cement, the tourniquet was deflated after a total tourniquet time of 95 minutes. Hemostasis was achieved using electrocautery. The knee was irrigated with copious amounts of normal saline using pulsatile lavage followed by 500 ml of Surgiphor and then suctioned dry. 20 mL of 1.3% Exparel and 60 mL of 0.25% Marcaine in 40 mL of normal saline was injected along the posterior capsule, medial and lateral gutters, and along the arthrotomy site. A 5 mm stabilized rotating platform polyethylene insert was inserted and the knee was placed through a range of motion with excellent mediolateral soft tissue balancing appreciated and excellent patellar tracking noted. 2 medium drains were placed in the wound bed and brought out through separate stab incisions. The medial parapatellar portion of the incision was reapproximated using interrupted sutures of #1 Vicryl. Subcutaneous tissue was approximated in layers using first #0 Vicryl followed #2-0 Vicryl. The skin was approximated with skin staples. A sterile dressing was applied.  The patient tolerated the procedure well and was transported to the recovery room in stable condition.    Carole Deere P. Holley Bouche., M.D.

## 2020-10-13 NOTE — Progress Notes (Signed)
PHARMACY NOTE:  ANTIMICROBIAL RENAL DOSAGE ADJUSTMENT  Current antimicrobial regimen includes a mismatch between antimicrobial dosage and estimated renal function.  As per policy approved by the Pharmacy & Therapeutics and Medical Executive Committees, the antimicrobial dosage will be adjusted accordingly.  Current antimicrobial dosage:  Vancomycin 1000mg  x1  Indication: surgical prophylaxis  Renal Function: Estimated Creatinine Clearance: 44.3 mL/min (A) (by C-G formula based on SCr of 1.42 mg/dL (H)).    Antimicrobial dosage has been changed to: Vancomycin 1500mg  x1 (based on wt of 103.4kg)  Additional comments: dose wt adjusted for pre-op prophylaxis per protocol  Thank you for allowing pharmacy to be a part of this patient's care.  Lorna Dibble, [facility] 10/13/2020 10:33 AM

## 2020-10-13 NOTE — Anesthesia Procedure Notes (Addendum)
Spinal  Patient location during procedure: OR Start time: 10/13/2020 11:20 AM End time: 10/13/2020 11:26 AM Staffing Performed: resident/CRNA  Resident/CRNA: Nelda Marseille, CRNA Preanesthetic Checklist Completed: patient identified, IV checked, site marked, risks and benefits discussed, surgical consent, monitors and equipment checked, pre-op evaluation and timeout performed Spinal Block Patient position: sitting Prep: Betadine Patient monitoring: heart rate, continuous pulse ox, blood pressure and cardiac monitor Approach: right paramedian Location: L3-4 Injection technique: single-shot Needle Needle type: Whitacre and Introducer  Needle gauge: 25 G Needle length: 9 cm Assessment Sensory level: T10 Additional Notes Negative paresthesia. Negative blood return. Positive free-flowing CSF. Expiration date of kit checked and confirmed. Patient tolerated procedure well, without complications.

## 2020-10-13 NOTE — Transfer of Care (Signed)
Immediate Anesthesia Transfer of Care Note  Patient: Tonya Cummings  Procedure(s) Performed: COMPUTER ASSISTED TOTAL KNEE ARTHROPLASTY - RNFA (Right Knee)  Patient Location: PACU  Anesthesia Type:Spinal  Level of Consciousness: awake and sedated  Airway & Oxygen Therapy: Patient Spontanous Breathing and Patient connected to face mask oxygen  Post-op Assessment: Report given to RN and Post -op Vital signs reviewed and stable  Post vital signs: Reviewed and stable  Last Vitals:  Vitals Value Taken Time  BP 130/89 10/13/20 1500  Temp 36.7 C 10/13/20 1500  Pulse 79 10/13/20 1508  Resp 15 10/13/20 1508  SpO2 96 % 10/13/20 1508  Vitals shown include unvalidated device data.  Last Pain:  Vitals:   10/13/20 1500  TempSrc:   PainSc: 0-No pain         Complications: No complications documented.

## 2020-10-13 NOTE — Anesthesia Procedure Notes (Signed)
Date/Time: 10/13/2020 11:12 AM Performed by: Doreen Salvage, CRNA Pre-anesthesia Checklist: Patient identified, Emergency Drugs available, Suction available and Patient being monitored Patient Re-evaluated:Patient Re-evaluated prior to induction Oxygen Delivery Method: Simple face mask Induction Type: IV induction Dental Injury: Teeth and Oropharynx as per pre-operative assessment

## 2020-10-13 NOTE — H&P (Signed)
The patient has been re-examined, and the chart reviewed, and there have been no interval changes to the documented history and physical.    The risks, benefits, and alternatives have been discussed at length. The patient expressed understanding of the risks benefits and agreed with plans for surgical intervention.  Allysen Lazo P. Cordae Mccarey, Jr. M.D.    

## 2020-10-13 NOTE — Anesthesia Postprocedure Evaluation (Signed)
Anesthesia Post Note  Patient: Tonya Cummings  Procedure(s) Performed: COMPUTER ASSISTED TOTAL KNEE ARTHROPLASTY - RNFA (Right Knee)  Patient location during evaluation: PACU Anesthesia Type: Spinal Level of consciousness: awake and alert Pain management: pain level controlled Vital Signs Assessment: post-procedure vital signs reviewed and stable Respiratory status: spontaneous breathing and respiratory function stable Cardiovascular status: blood pressure returned to baseline and stable Postop Assessment: spinal receding Anesthetic complications: no   No complications documented.   Last Vitals:  Vitals:   10/13/20 1512 10/13/20 1517  BP:    Pulse: 86 82  Resp: (!) 24 15  Temp:    SpO2: 91% 92%    Last Pain:  Vitals:   10/13/20 1517  TempSrc:   PainSc: 7                  Yoav Okane T [name]

## 2020-10-13 NOTE — Anesthesia Preprocedure Evaluation (Addendum)
Anesthesia Evaluation  Patient identified by MRN, date of birth, ID band Patient awake    Reviewed: Allergy & Precautions, H&P , NPO status , reviewed documented beta blocker date and time   Airway Mallampati: II  TM Distance: >3 FB Neck ROM: full    Dental  (+) Caps   Pulmonary pneumonia, resolved,    Pulmonary exam normal        Cardiovascular hypertension, Normal cardiovascular exam     Neuro/Psych  Headaches, PSYCHIATRIC DISORDERS Anxiety Depression  Neuromuscular disease    GI/Hepatic GERD  Medicated and Controlled,  Endo/Other    Renal/GU Renal disease     Musculoskeletal  (+) Arthritis ,   Abdominal   Peds  Hematology  (+) Blood dyscrasia, anemia ,   Anesthesia Other Findings Past Medical History: No date: Anxiety No date: Chronic kidney disease No date: Chronic pain No date: Chronic radicular pain of lower back No date: Costochondritis No date: Depression No date: Encounter for blood transfusion No date: GERD (gastroesophageal reflux disease) No date: Headache     Comment:  migraines No date: Hip dysplasia No date: Hyperlipidemia No date: Hypertension No date: Iron deficiency anemia No date: Low back pain No date: Low vitamin B12 level No date: Obesity No date: Osteoporosis No date: Pelvic fracture (HCC) No date: Raynaud's disease without gangrene No date: Restless leg syndrome No date: Rheumatoid aortitis No date: Rheumatoid arthritis (HCC)     Comment:  polyarthritis No date: Rheumatoid arthritis (HCC) No date: Seasonal allergies No date: Sleep disorder No date: Thoracic compression fracture [facility])  Past Surgical History: No date: ABDOMINAL SURGERY     Comment:  pt denies No date: APPENDECTOMY No date: CARPAL TUNNEL RELEASE; Bilateral No date: CHOLECYSTECTOMY 08/15/2020: COLONOSCOPY WITH PROPOFOL; N/A     Comment:  Procedure: COLONOSCOPY WITH PROPOFOL;  Surgeon: Robert Bellow, MD;  Location: [facility];  Service:               Endoscopy;  Laterality: N/A; No date: DILATION AND CURETTAGE OF UTERUS 08/15/2020: ESOPHAGOGASTRODUODENOSCOPY (EGD) WITH PROPOFOL; N/A     Comment:  Procedure: ESOPHAGOGASTRODUODENOSCOPY (EGD) WITH               PROPOFOL;  Surgeon: Robert Bellow, MD;  Location:               [facility];  Service: Endoscopy;  Laterality: N/A; No date: FRACTURE SURGERY     Comment:  hip fracture  No date: FRACTURE SURGERY     Comment:  pelvic fracture plate  No date: HIP SURGERY; Left No date: TUBAL LIGATION  BMI    Body Mass Index: 39.23 kg/m      Reproductive/Obstetrics                            Anesthesia Physical Anesthesia Plan  ASA: III  Anesthesia Plan: General and Spinal   Post-op Pain Management:    Induction: Intravenous  PONV Risk Score and Plan: Treatment may vary due to age or medical condition and TIVA  Airway Management Planned: Nasal Cannula and Natural Airway  Additional Equipment:   Intra-op Plan:   Post-operative Plan:   Informed Consent: I have reviewed the patients History and Physical, chart, labs and discussed the procedure including the risks, benefits and alternatives for the proposed anesthesia with the patient or authorized representative who has indicated  his/her understanding and acceptance.     Dental Advisory Given  Plan Discussed with: CRNA  Anesthesia Plan Comments: (Pt accepts spinal/natural airway TIVA)       Anesthesia Quick Evaluation

## 2020-10-14 ENCOUNTER — Encounter: Payer: Self-pay | Admitting: Orthopedic Surgery

## 2020-10-14 MED ORDER — SODIUM CHLORIDE 0.9 % IV SOLN
INTRAVENOUS | Status: DC | PRN
  Administered 2020-10-14: 250 mL via INTRAVENOUS

## 2020-10-14 NOTE — Progress Notes (Signed)
ORTHOPAEDICS PROGRESS NOTE  PATIENT NAME: Tonya Cummings DOB: 1951-11-06  MRN: [id number]  POD # 1: Right total knee arthroplasty  Subjective: The patient states that pain is under good control.  She denies any nausea or vomiting.  She did complain of a dry throat and mild discomfort. She was not given the opportunity to sit at the side of the bed and dangle last night.  Objective: Vital signs in last 24 hours: Temp:  [97.2 F (36.2 C)-98.3 F (36.8 C)] 98.1 F (36.7 C) (11/02 0414) Pulse Rate:  [80-90] 84 (11/02 0414) Resp:  [13-24] 16 (11/02 0414) BP: (103-154)/(49-93) 108/55 (11/02 0414) SpO2:  [91 %-99 %] 96 % (11/02 0414) Weight:  [102.1 kg] 102.1 kg (11/01 1030)  Intake/Output from previous day: 11/01 0701 - 11/02 0700 In: 589.9 [IV Piggyback:539.9] Out: 960 [Urine:840; Drains:70; Blood:50]  No results for input(s): WBC, HGB, HCT, PLT, K, CL, CO2, BUN, CREATININE, GLUCOSE, CALCIUM, LABPT, INR in the last 72 hours.  EXAM General: Well-developed well-nourished female seen in no apparent discomfort. Lungs: clear to auscultation Cardiac: normal rate and regular rhythm Abdomen: Soft, nontender, nondistended.  Bowel sounds are present. Right lower extremity: Dressing is dry and intact.  Polar Care and Hemovac are in place and functioning.  The patient is able to perform an independent straight leg raise.  Homans test is negative. Neurologic: Awake, alert, oriented.  Sensory and motor function are intact.  Assessment: Right total knee arthroplasty  Secondary diagnoses: Rheumatoid arthritis Restless leg syndrome Hypertension Gastroesophageal reflux disease Depression/anxiety Iron deficiency anemia Raynaud's disease Osteoporosis Hyperlipidemia Chronic kidney disease  Plan: She was informed on the use of Cepacol/Cepastat as needed for throat irritation.   Today's goals were reviewed with the patient. Begin physical therapy and Occupational Therapy as per total  knee arthroplasty rehab protocol. Plan is to go Home after hospital stay. DVT Prophylaxis - Lovenox, Foot Pumps and TED hose  Davonta Stroot P. Holley Bouche M.D.

## 2020-10-14 NOTE — Evaluation (Signed)
Occupational Therapy Evaluation Patient Details Name: Tonya Cummings MRN: [id number] DOB: [birth year] Today's Date: 10/14/2020    History of Present Illness Tonya Cummings is s/p R TKA on 10/13/20.  PMH includes: headaches, axiety, depression, GERD, renal disease, arthritis, CKD, costochondritis, hip dysplasia, hyperlipidemia, HTN, lumbago, osteoporsosi, rheumatoid arthritis, seasonal allergies, sleep disorder, and thoracic compression fx.   Clinical Impression   Tonya Cummings was seen for OT evaluation this date, POD#1 from above surgery. Prior to hospital admission, Tonya Cummings was MOD I for mobility and ADLs using shower chair - reports PRN assist from DTR for ADLs when pain limited. Tonya Cummings lives c husband and children in mobile home c 3 STE. Tonya Cummings presents to acute OT demonstrating impaired ADL performance and functional mobility 2/2 decreased LB access, functional strength/balance deficits, and decreased activity tolerance. Tonya Cummings currently requires MIN A doff L sock seated EOC, MAX A doff R sock. CGA + RW for ADL t/f and simulated UBD. Tonya Cummings instructed in polar care mgt, falls prevention strategies, home/routines modifications, DME/AE for LB bathing and dressing tasks, and compression stocking mgt. Tonya Cummings would benefit from skilled OT services including additional instruction in dressing techniques with or without assistive devices for dressing and bathing skills to support recall and carryover prior to discharge and ultimately to maximize safety, independence, and minimize falls risk and caregiver burden. Tonya Cummings reports having all DME at home. Upon hospital discharge, recommend HHOT to maximize Tonya Cummings safety and return to functional independence during meaningful occupations of daily life.     Follow Up Recommendations  Home health OT    Equipment Recommendations  None recommended by OT    Recommendations for Other Services       Precautions / Restrictions Precautions Precautions: None Restrictions Weight Bearing Restrictions:  Yes RLE Weight Bearing: Weight bearing as tolerated      Mobility Bed Mobility General bed mobility comments: Tonya Cummings received and left up in chair     Transfers Overall transfer level: Needs assistance Equipment used: 1 person hand held assist Transfers: Sit to/from Stand Sit to Stand: Min guard      Balance Overall balance assessment: Needs assistance Sitting-balance support: No upper extremity supported;Feet supported Sitting balance-Leahy Scale: Good     Standing balance support: Single extremity supported;During functional activity Standing balance-Leahy Scale: Fair          ADL either performed or assessed with clinical judgement   ADL Overall ADL's : Needs assistance/impaired      General ADL Comments: MIN A doff L sock seated EOC, MAX A doff R sock. CGA + RW for ADL t/f and simulated UBD                  Pertinent Vitals/Pain Pain Assessment: 0-10 Pain Score: 3  Pain Location: R posterior knee Pain Descriptors / Indicators: Aching;Constant Pain Intervention(s): Limited activity within patient's tolerance;Monitored during session;Repositioned     Hand Dominance Right   Extremity/Trunk Assessment Upper Extremity Assessment Upper Extremity Assessment: Overall WFL for tasks assessed   Lower Extremity Assessment Lower Extremity Assessment: Generalized weakness RLE Deficits / Details: Decreased mobility of R LE due to surgical procedure. RLE Sensation: WNL RLE Coordination: WNL   Cervical / Trunk Assessment Cervical / Trunk Assessment: Normal   Communication Communication Communication: No difficulties   Cognition Arousal/Alertness: Awake/alert Behavior During Therapy: WFL for tasks assessed/performed Overall Cognitive Status: Within Functional Limits for tasks assessed         General Comments       Exercises Exercises: Other exercises  Other Exercises Other Exercises: Tonya Cummings educated re: OT role, DME recs, d/c recs, falls prevention, ECS, polar  care, adapted dressing/bathing techniques, compression stocking mgmt Other Exercises: LBD, UBD, sit<>stand, sitting/standing balance/tolerance   Shoulder Instructions      Home Living Family/patient expects to be discharged to:: Private residence Living Arrangements: Spouse/significant other;Children Available Help at Discharge: Family Type of Home: Mobile home Home Access: Stairs to enter Technical brewer of Steps: 3 Entrance Stairs-Rails: Can reach both Home Layout: One level     Bathroom Shower/Tub: Occupational psychologist: Standard Bathroom Accessibility: Yes   Home Equipment: Environmental consultant - 2 wheels;Bedside commode;Toilet riser;Grab bars - tub/shower;Wheelchair - manual;Shower seat          Prior Functioning/Environment Level of Independence: Needs assistance    ADL's / Homemaking Assistance Needed: Tonya Cummings reports sitting for bathing and PRN assist from DTR for LBD and IADLs            OT Problem List: Decreased range of motion;Decreased activity tolerance;Impaired balance (sitting and/or standing)      OT Treatment/Interventions: Self-care/ADL training;Therapeutic exercise;Energy conservation;DME and/or AE instruction;Therapeutic activities;Patient/family education;Balance training    OT Goals(Current goals can be found in the care plan section) Acute Rehab OT Goals Patient Stated Goal: To return home OT Goal Formulation: With patient Time For Goal Achievement: 10/28/20 Potential to Achieve Goals: Good ADL Goals Tonya Cummings Will Perform Grooming: with modified independence;standing (c LRAD PRN) Tonya Cummings Will Perform Lower Body Dressing: with min assist;sit to/from stand (c LRAD PRN) Tonya Cummings Will Transfer to Toilet: with modified independence;ambulating;regular height toilet (c LRAD PRN)  OT Frequency: Min 1X/week    AM-PAC OT "6 Clicks" Daily Activity     Outcome Measure Help from another person eating meals?: None Help from another person taking care of personal  grooming?: None Help from another person toileting, which includes using toliet, bedpan, or urinal?: A Little Help from another person bathing (including washing, rinsing, drying)?: A Little Help from another person to put on and taking off regular upper body clothing?: None Help from another person to put on and taking off regular lower body clothing?: A Little 6 Click Score: 21   End of Session Equipment Utilized During Treatment: Rolling walker  Activity Tolerance: Patient tolerated treatment well Patient left: in chair;with call bell/phone within reach;with chair alarm set  OT Visit Diagnosis: Other abnormalities of gait and mobility (R26.89)                Time: 1101-1117 OT Time Calculation (min): 16 min Charges:  OT General Charges $OT Visit: 1 Visit OT Evaluation $OT Eval Low Complexity: 1 Low OT Treatments $Self Care/Home Management : 8-22 mins  Dessie Coma, M.S. OTR/L  10/14/20, 12:43 PM  ascom [phone]

## 2020-10-14 NOTE — Progress Notes (Signed)
Physical Therapy Treatment Patient Details Name: Tonya Cummings MRN: [id number] DOB: [birth year] Today's Date: 10/14/2020    History of Present Illness Pt is s/p R TKA on 10/13/20.  PMH includes: headaches, axiety, depression, GERD, renal disease, arthritis, CKD, costochondritis, hip dysplasia, hyperlipidemia, HTN, lumbago, osteoporsosi, rheumatoid arthritis, seasonal allergies, sleep disorder, and thoracic compression fx.    PT Comments    Pt agreeable to therapy and is willing to participate in gait training along with stair training.  Pt performed transfer with modI, only require extra time and use of FWW for support for coming out of chair.  Pt then proceeded to ambulate out of room and into PT gym where stair training would be performed.  Pt allowed a seated rest break as visual and verbal demonstration was given to pt.  Pt then attempted to perform and had good carryover.  Pt has decreased velocity with movements, but they are safe and effective.  Pt then proceeded to ambulate back to room with encouragement and verbal cuing for keeping RLE internally rotated during ambulation.  Pt notes that her leg has always been ER and she cannot prevent it.  Pt transferred back to her bed and left with all needs met and call bell within reach.  Current discharge plans to HHPT remain appropriate at this time.  Pt will continue to benefit from skilled therapy in order to address deficits listed below.   Follow Up Recommendations  Home health PT     Equipment Recommendations  None recommended by PT;Other (comment) (Pt reports she has all AD necessary at home, but some are in storage.)    Recommendations for Other Services       Precautions / Restrictions Precautions Precautions: None Restrictions Weight Bearing Restrictions: Yes RLE Weight Bearing: Weight bearing as tolerated    Mobility  Bed Mobility Overal bed mobility: Modified Independent             General bed mobility comments:  Pt received up in chair and left in bed.  Transfers Overall transfer level: Needs assistance Equipment used: Rolling walker (2 wheeled) Transfers: Sit to/from Stand Sit to Stand: Min guard         General transfer comment: CGA for safety and verbal cues for hand placement.  Ambulation/Gait Ambulation/Gait assistance: Min guard   Assistive device: Rolling walker (2 wheeled) Gait Pattern/deviations: Step-to pattern;Decreased step length - left;Decreased stance time - right;Decreased stride length;Decreased weight shift to right Gait velocity: decreased   General Gait Details: Pt ambulates with antalgic gait pattern, however is steady with her movements.   Stairs Stairs: Yes Stairs assistance: Min guard Stair Management: Two rails;Step to pattern;Forwards Number of Stairs: 4 General stair comments: Pt navigates stairs with good technique and carryover after verbal and visual cuing.   Wheelchair Mobility    Modified Rankin (Stroke Patients Only)       Balance Overall balance assessment: Needs assistance Sitting-balance support: No upper extremity supported;Feet supported Sitting balance-Leahy Scale: Good     Standing balance support: Single extremity supported;During functional activity Standing balance-Leahy Scale: Fair                              Cognition Arousal/Alertness: Awake/alert Behavior During Therapy: WFL for tasks assessed/performed Overall Cognitive Status: Within Functional Limits for tasks assessed  Exercises   ° °  °General Comments   °  °  ° °Pertinent Vitals/Pain Pain Assessment: 0-10 °Pain Score: 4  °Pain Location: R posterior knee °Pain Descriptors / Indicators: Aching;Constant °Pain Intervention(s): Limited activity within patient's tolerance;Monitored during session;Repositioned  ° ° °Home Living   °  °  °  °  °  °  °  °   °  °Prior Function    °  °  °   ° °PT Goals (current  goals can now be found in the care plan section) Acute Rehab PT Goals °Patient Stated Goal: To return home °PT Goal Formulation: With patient °Time For Goal Achievement: 10/28/20 °Potential to Achieve Goals: Good °Progress towards PT goals: Progressing toward goals ° °  °Frequency ° ° ° BID ° ° ° °  °PT Plan    ° ° °Co-evaluation   °  °  °  °  ° °  °AM-PAC PT "6 Clicks" Mobility   °Outcome Measure ° Help needed turning from your back to your side while in a flat bed without using bedrails?: A Little °Help needed moving from lying on your back to sitting on the side of a flat bed without using bedrails?: A Little °Help needed moving to and from a bed to a chair (including a wheelchair)?: A Little °Help needed standing up from a chair using your arms (e.g., wheelchair or bedside chair)?: A Little °Help needed to walk in hospital room?: A Little °Help needed climbing 3-5 steps with a railing? : A Little °6 Click Score: 18 ° °  °End of Session Equipment Utilized During Treatment: Gait belt °Activity Tolerance: Patient tolerated treatment well °Patient left: in bed;with call bell/phone within reach;with bed alarm set °Nurse Communication: Mobility status °PT Visit Diagnosis: Unsteadiness on feet (R26.81);Other abnormalities of gait and mobility (R26.89);Muscle weakness (generalized) (M62.81);Difficulty in walking, not elsewhere classified (R26.2);Pain °Pain - Right/Left: Right °Pain - part of body: Knee °  ° ° °Time: 1521-1545 °PT Time Calculation (min) (ACUTE ONLY): 24 min ° °Charges:  $Gait Training: 23-37 mins °$Therapeutic Exercise: 8-22 mins          °          ° °Joshua Robbins, PT, DPT °10/14/20, 4:54 PM ° ° °

## 2020-10-14 NOTE — Evaluation (Signed)
Physical Therapy Evaluation Patient Details Name: Tonya Cummings MRN: [id number] DOB: 1951-06-26 Today's Date: 10/14/2020   History of Present Illness  Pt is s/p R TKA on 10/13/20.  PMH includes: headaches, axiety, depression, GERD, renal disease, arthritis, CKD, costochondritis, hip dysplasia, hyperlipidemia, HTN, lumbago, osteoporsosi, rheumatoid arthritis, seasonal allergies, sleep disorder, and thoracic compression fx.     Clinical Impression  Pt received in Semi-Fowler's position, with RLE elevated off the edge of bed on right side, externally rotated and agreeable to therapy.  Pt educated on importance of exercise following TKA and was able to perform bed-level exercises with good technique.  Pt require minA at beginning of SLR on the RLE, however progressed to not requiring any assistance by end of set.  Pt then proceeded to perform bed mobility modI and use of bedrails for support.  Pt then able to perform seated leg exercises without difficulty.  Pt then performed sit <> stand with verbal cuing for hand placement, and CGA for safety.  Pt then performed weight shifts and standing marches without difficulty.  Pt then ambulated in hospital room performing several laps before asking to perform self-care in restroom.  Pt then transferred to <> from toilet with CGA and then transferred to recliner where pt was left with all needs met and chair alarm set.  Nursing notified of mobility status.  Pt will benefit from skilled PT intervention to increase independence and safety with basic mobility in preparation for discharge to the venue listed below.          Follow Up Recommendations Home health PT    Equipment Recommendations  None recommended by PT;Other (comment) (Pt reports she has all AD necessary at home, but some are in storage.)    Recommendations for Other Services       Precautions / Restrictions Precautions Precautions: None Restrictions Weight Bearing Restrictions: Yes RLE  Weight Bearing: Weight bearing as tolerated      Mobility  Bed Mobility Overal bed mobility: Modified Independent             General bed mobility comments: requires extra time and use of bedrails for assistance.    Transfers Overall transfer level: Needs assistance Equipment used: Rolling walker (2 wheeled) Transfers: Sit to/from Stand Sit to Stand: Min guard         General transfer comment: CGA for safety and verbal cues for hand placement.  Ambulation/Gait Ambulation/Gait assistance: Min guard Gait Distance (Feet): 80 Feet Assistive device: Rolling walker (2 wheeled) Gait Pattern/deviations: Step-to pattern;Decreased step length - left;Decreased stance time - right;Decreased stride length;Decreased weight shift to right Gait velocity: decreased   General Gait Details: Pt ambulates with antalgic gait pattern, however is steady with her movements.  Stairs            Wheelchair Mobility    Modified Rankin (Stroke Patients Only)       Balance Overall balance assessment: Needs assistance Sitting-balance support: No upper extremity supported;Feet supported Sitting balance-Leahy Scale: Good     Standing balance support: Single extremity supported;During functional activity Standing balance-Leahy Scale: Fair                               Pertinent Vitals/Pain Pain Assessment: 0-10 Pain Score: 3  Pain Location: R posterior knee Pain Descriptors / Indicators: Aching;Constant Pain Intervention(s): Limited activity within patient's tolerance;Monitored during session;Repositioned    Home Living Family/patient expects to be discharged to:: Private residence  Living Arrangements: Spouse/significant other;Children Available Help at Discharge: Family Type of Home: Mobile home Home Access: Stairs to enter Entrance Stairs-Rails: Can reach both Entrance Stairs-Number of Steps: 3 Home Layout: One level Home Equipment: [place] - 2 wheels;Bedside  commode;Toilet riser;Grab bars - tub/shower;Wheelchair - manual;Shower seat      Prior Function Level of Independence: Needs assistance      ADL's / Homemaking Assistance Needed: Pt reports sitting for bathing and PRN assist from DTR for LBD and IADLs        Hand Dominance   Dominant Hand: Right    Extremity/Trunk Assessment   Upper Extremity Assessment Upper Extremity Assessment: Overall WFL for tasks assessed    Lower Extremity Assessment Lower Extremity Assessment: Generalized weakness RLE Deficits / Details: Decreased mobility of R LE due to surgical procedure. RLE Sensation: WNL RLE Coordination: WNL    Cervical / Trunk Assessment Cervical / Trunk Assessment: Normal  Communication   Communication: No difficulties  Cognition Arousal/Alertness: Awake/alert Behavior During Therapy: WFL for tasks assessed/performed Overall Cognitive Status: Within Functional Limits for tasks assessed                                        General Comments      Exercises Total Joint Exercises Ankle Circles/Pumps: AROM;Strengthening;Both;10 reps;Supine Quad Sets: AROM;Strengthening;Both;10 reps;Supine Gluteal Sets: AROM;Strengthening;Both;10 reps;Supine Heel Slides: AROM;Strengthening;Both;10 reps;Seated Hip ABduction/ADduction: AROM;Strengthening;Both;10 reps;Supine Straight Leg Raises: AROM;Strengthening;Both;10 reps;Supine Long Arc Quad: AROM;Strengthening;Both;10 reps;Seated Marching in Standing: AROM;Strengthening;Both;10 reps;Standing Other Exercises Other Exercises: Pt educated re: OT role, DME recs, d/c recs, falls prevention, ECS, polar care, adapted dressing/bathing techniques, compression stocking mgmt Other Exercises: LBD, UBD, sit<>stand, sitting/standing balance/tolerance   Assessment/Plan    PT Assessment Patient needs continued PT services  PT Problem List Decreased strength;Decreased range of motion;Decreased activity tolerance;Decreased  balance;Decreased mobility;Decreased knowledge of use of DME;Decreased safety awareness;Pain       PT Treatment Interventions DME instruction;Gait training;Stair training;Functional mobility training;Therapeutic activities;Therapeutic exercise;Balance training    PT Goals (Current goals can be found in the Care Plan section)  Acute Rehab PT Goals Patient Stated Goal: To return home PT Goal Formulation: With patient Time For Goal Achievement: 10/28/20 Potential to Achieve Goals: Good    Frequency BID   Barriers to discharge        Co-evaluation               AM-PAC PT "6 Clicks" Mobility  Outcome Measure Help needed turning from your back to your side while in a flat bed without using bedrails?: A Little Help needed moving from lying on your back to sitting on the side of a flat bed without using bedrails?: A Little Help needed moving to and from a bed to a chair (including a wheelchair)?: A Little Help needed standing up from a chair using your arms (e.g., wheelchair or bedside chair)?: A Little Help needed to walk in hospital room?: A Little Help needed climbing 3-5 steps with a railing? : A Lot 6 Click Score: 17    End of Session Equipment Utilized During Treatment: Gait belt Activity Tolerance: Patient tolerated treatment well Patient left: in chair;with chair alarm set;with nursing/sitter in room Nurse Communication: Mobility status PT Visit Diagnosis: Unsteadiness on feet (R26.81);Other abnormalities of gait and mobility (R26.89);Muscle weakness (generalized) (M62.81);Difficulty in walking, not elsewhere classified (R26.2);Pain Pain - Right/Left: Right Pain - part of body: Knee    Time:  0350-0938 PT Time Calculation (min) (ACUTE ONLY): 48 min   Charges:   PT Evaluation $PT Eval Low Complexity: 1 Low PT Treatments $Gait Training: 23-37 mins $Therapeutic Exercise: 8-22 mins        Gwenlyn Saran, PT, DPT 10/14/20, 1:13 PM

## 2020-10-15 MED ORDER — CELECOXIB 200 MG PO CAPS: 200.0000 mg | ORAL_CAPSULE | Freq: Two times a day (BID) | ORAL | 1 refills | Status: DC

## 2020-10-15 MED ORDER — LACTULOSE 10 GM/15ML PO SOLN
20.0000 g | Freq: Two times a day (BID) | ORAL | Status: DC | PRN
  Administered 2020-10-15: 20 g via ORAL
  Filled 2020-10-15: qty 30

## 2020-10-15 MED ORDER — TRAMADOL HCL 50 MG PO TABS
50.0000 mg | ORAL_TABLET | ORAL | 0 refills | Status: DC | PRN
Start: 2020-10-15 — End: 2021-02-24

## 2020-10-15 MED ORDER — OXYCODONE HCL 5 MG PO TABS
5.0000 mg | ORAL_TABLET | ORAL | 0 refills | Status: DC | PRN
Start: 2020-10-15 — End: 2021-02-24

## 2020-10-15 MED ORDER — ENOXAPARIN SODIUM 40 MG/0.4ML ~~LOC~~ SOLN: 40.0000 mg | SUBCUTANEOUS | 0 refills | Status: DC

## 2020-10-15 NOTE — Progress Notes (Signed)
Physical Therapy Treatment Patient Details Name: Tonya Cummings MRN: [id number] DOB: 11/26/1951 Today's Date: 10/15/2020    History of Present Illness Pt is s/p R TKA on 10/13/20.  PMH includes: headaches, axiety, depression, GERD, renal disease, arthritis, CKD, costochondritis, hip dysplasia, hyperlipidemia, HTN, lumbago, osteoporsosi, rheumatoid arthritis, seasonal allergies, sleep disorder, and thoracic compression fx.    PT Comments    Pt received in supine position with RLE in zero degree bone foam.  Pt agreeable to therapy.  Pt reports 3/10 pain and unsure if she will be able to ambulate as well due to stiffness.  Pt's dressing were changed and wound vac removed.  Pt able to transfer with supervision and has good carryover from verbal cuing during previous sessions.  Pt transferred to sitting with use of BUE for support on FWW and bed.  Pt then proceeded to ambulate with CGA around nursing station and noted a decrease in soreness as ambulation progressed.  Pt is effectively able to mobilize and transfer within the home without any concerns from therapist.  ROM found to be adequate following surgery, achieving 90 deg of flexion at bedside and 180 deg when leg was in bone foam.  Current discharge plans to HHPT remain appropriate at this time.  Pt will continue to benefit from skilled therapy in order to address deficits remaining and to increase ROM going forward.      Follow Up Recommendations  Home health PT     Equipment Recommendations  None recommended by PT;Other (comment) (Pt reports she has all AD necessary at home, but some are in storage.)    Recommendations for Other Services       Precautions / Restrictions Precautions Precautions: None Restrictions Weight Bearing Restrictions: Yes RLE Weight Bearing: Weight bearing as tolerated    Mobility  Bed Mobility Overal bed mobility: Modified Independent             General bed mobility comments: Pt received in bed  and left in recliner.  Transfers Overall transfer level: Needs assistance Equipment used: Rolling walker (2 wheeled) Transfers: Sit to/from Stand Sit to Stand: Supervision         General transfer comment: CGA for safety and verbal cues for hand placement.  Ambulation/Gait Ambulation/Gait assistance: Supervision   Assistive device: Rolling walker (2 wheeled) Gait Pattern/deviations: Step-to pattern;Decreased step length - left;Decreased stance time - right;Decreased stride length;Decreased weight shift to right Gait velocity: decreased   General Gait Details: Pt received verbal cuing for increasing stride length on the LLE and is able to do so well, with good carryover.   Stairs             Wheelchair Mobility    Modified Rankin (Stroke Patients Only)       Balance Overall balance assessment: Needs assistance Sitting-balance support: No upper extremity supported;Feet supported Sitting balance-Leahy Scale: Good     Standing balance support: Bilateral upper extremity supported Standing balance-Leahy Scale: Good                              Cognition                                              Exercises      General Comments        Pertinent Vitals/Pain Pain Assessment:  0-10 Pain Score: 4  Pain Location: R posterior knee Pain Descriptors / Indicators: Aching;Constant Pain Intervention(s): Limited activity within patient's tolerance;Monitored during session;Repositioned    Home Living                      Prior Function            PT Goals (current goals can now be found in the care plan section) Progress towards PT goals: Progressing toward goals    Frequency    BID      PT Plan      Co-evaluation              AM-PAC PT "6 Clicks" Mobility   Outcome Measure  Help needed turning from your back to your side while in a flat bed without using bedrails?: A Little Help needed moving from  lying on your back to sitting on the side of a flat bed without using bedrails?: A Little Help needed moving to and from a bed to a chair (including a wheelchair)?: A Little Help needed standing up from a chair using your arms (e.g., wheelchair or bedside chair)?: A Little Help needed to walk in hospital room?: A Little Help needed climbing 3-5 steps with a railing? : A Little 6 Click Score: 18    End of Session Equipment Utilized During Treatment: Gait belt Activity Tolerance: Patient tolerated treatment well Patient left: in bed;with call bell/phone within reach;with bed alarm set Nurse Communication: Mobility status PT Visit Diagnosis: Unsteadiness on feet (R26.81);Other abnormalities of gait and mobility (R26.89);Muscle weakness (generalized) (M62.81);Difficulty in walking, not elsewhere classified (R26.2);Pain Pain - Right/Left: Right Pain - part of body: Knee     Time: 4315-4008 PT Time Calculation (min) (ACUTE ONLY): 23 min  Charges:  $Gait Training: 23-37 mins                     Gwenlyn Saran, PT, DPT 10/15/20, 12:12 PM

## 2020-10-15 NOTE — Progress Notes (Signed)
  Subjective: 2 Days Post-Op Procedure(s) (LRB): COMPUTER ASSISTED TOTAL KNEE ARTHROPLASTY - RNFA (Right) Patient reports pain as moderate.   Patient is well, and has had no acute complaints or problems Plan is to go Home after hospital stay. Negative for chest pain and shortness of breath Fever: no Gastrointestinal: Negative for nausea and vomiting  Objective: Vital signs in last 24 hours: Temp:  [97.6 F (36.4 C)-98.4 F (36.9 C)] 97.6 F (36.4 C) (11/03 0425) Pulse Rate:  [78-85] 79 (11/03 0425) Resp:  [15-20] 18 (11/03 0425) BP: (110-130)/(50-64) 129/52 (11/03 0425) SpO2:  [97 %-100 %] 97 % (11/03 0425)  Intake/Output from previous day:  Intake/Output Summary (Last 24 hours) at 10/15/2020 0709 Last data filed at 10/14/2020 1510 Gross per 24 hour  Intake 374.67 ml  Output 70 ml  Net 304.67 ml    Intake/Output this shift: No intake/output data recorded.  Labs: No results for input(s): HGB in the last 72 hours. No results for input(s): WBC, RBC, HCT, PLT in the last 72 hours. No results for input(s): NA, K, CL, CO2, BUN, CREATININE, GLUCOSE, CALCIUM in the last 72 hours. No results for input(s): LABPT, INR in the last 72 hours.   EXAM General - Patient is Alert and Oriented Extremity - Neurovascular intact Sensation intact distally Dorsiflexion/Plantar flexion intact Compartment soft Dressing/Incision - clean, dry, with the Hemovac removed.  The Hemovac tubing was removed with no complication and the distal tips were intact.  The honeycomb dressing was changed to a dry dressing. Motor Function - intact, moving foot and toes well on exam.  Able to straight leg raise independently.  Ambulated to the gym and has completed 4 stairs.  Past Medical History:  Diagnosis Date  . Anxiety   . Chronic kidney disease   . Chronic pain   . Chronic radicular pain of lower back   . Costochondritis   . Depression   . Encounter for blood transfusion   . GERD (gastroesophageal  reflux disease)   . Headache    migraines  . Hip dysplasia   . Hyperlipidemia   . Hypertension   . Iron deficiency anemia   . Low back pain   . Low vitamin B12 level   . Obesity   . Osteoporosis   . Pelvic fracture ([place])   . Raynaud's disease without gangrene   . Restless leg syndrome   . Rheumatoid aortitis   . Rheumatoid arthritis (HCC)    polyarthritis  . Rheumatoid arthritis ([place])   . Seasonal allergies   . Sleep disorder   . Thoracic compression fracture (HCC)     Assessment/Plan: 2 Days Post-Op Procedure(s) (LRB): COMPUTER ASSISTED TOTAL KNEE ARTHROPLASTY - RNFA (Right) Active Problems:   Total knee replacement status  Estimated body mass index is 39.23 kg/m as calculated from the following:   Height as of this encounter: 5' 3.5" (1.613 m).   Weight as of this encounter: 102.1 kg. Advance diet Up with therapy Discharge home with home health  DVT Prophylaxis - Lovenox, Foot Pumps and TED hose Weight-Bearing as tolerated to right leg  Reche Dixon, PA-C Orthopaedic Surgery 10/15/2020, 7:09 AM

## 2020-10-15 NOTE — TOC Initial Note (Signed)
Transition of Care [facility]) - Initial/Assessment Note    Patient Details  Name: Tonya Cummings MRN: [id number] Date of Birth: 09/28/1951  Transition of Care [facility] CM/SW Contact:    Shelbie Ammons, RN Phone Number: 10/15/2020, 10:16 AM  Clinical Narrative:   RNCM met with patient at bedside to discuss discharge plans. Patient reports to feeling well today and is excited to go home but relays that she is waiting to have a bowel movement first. Patient reports that she has already been contacted by [facility] for home health and they will be coming out on Thursday. Patient reports that she has all necessary equipment at home from previous surgeries and that her husband will be picking her up. No other needs identified at this time.                 Expected Discharge Plan: [place] Barriers to Discharge: No Barriers Identified   Patient Goals and CMS Choice        Expected Discharge Plan and Services Expected Discharge Plan: [place] Choice: [place] arrangements for the past 2 months: Single Family Home Expected Discharge Date: 10/15/20                         HH Arranged: PT, OT HH Agency: [facility] (formerly [employer]) Date [place]: 10/15/20 Time [place]: [place] Representative spoke with at [place]: [place] Arrangements/Services Living arrangements for the past 2 months: [place] Lives with:: Spouse, Adult Children Patient language and need for interpreter reviewed:: Yes Do you feel safe going back to the place where you live?: Yes      Need for Family Participation in Patient Care: Yes (Comment) Care giver support system in place?: Yes (comment) Current home services: Home PT, Home OT Criminal Activity/Legal Involvement Pertinent to Current Situation/Hospitalization: No - Comment as needed  Activities of Daily Living Home Assistive  Devices/Equipment: Eyeglasses, Cane (specify quad or straight), Walker (specify type) ADL Screening (condition at time of admission) Patient's cognitive ability adequate to safely complete daily activities?: Yes Is the patient deaf or have difficulty hearing?: No Does the patient have difficulty seeing, even when wearing glasses/contacts?: No Does the patient have difficulty concentrating, remembering, or making decisions?: Yes (forgetful) Patient able to express need for assistance with ADLs?: Yes Does the patient have difficulty dressing or bathing?: No Independently performs ADLs?: Yes (appropriate for developmental age) Does the patient have difficulty walking or climbing stairs?: Yes Weakness of Legs: Both Weakness of Arms/Hands: None  Permission Sought/Granted                  Emotional Assessment Appearance:: Appears stated age Attitude/Demeanor/Rapport: Engaged Affect (typically observed): Appropriate, Calm Orientation: : Oriented to Self, Oriented to Place, Oriented to  Time, Oriented to Situation Alcohol / Substance Use: Not Applicable Psych Involvement: No (comment)  Admission diagnosis:  Total knee replacement status [Z96.659] Patient Active Problem List   Diagnosis Date Noted   Total knee replacement status 10/13/2020   CKD (chronic kidney disease) stage 3, GFR 30-59 ml/min ([place]) 10/12/2020   Hyperlipidemia 10/12/2020   Hypertension 10/12/2020   Migraine headache 10/12/2020   Restless legs syndrome (RLS) 10/12/2020   Severe obesity (BMI 35.0-39.9) with comorbidity ([place]) 06/30/2020   Primary osteoarthritis of right knee 03/27/2020   Iron deficiency anemia 09/12/2019  Low vitamin B12 level 09/12/2019   Normocytic anemia 09/07/2019   Right medial knee pain 08/09/2018   Depression with anxiety 03/22/2018   Sleep disorder 03/22/2018   Influenza A 02/17/2018   Community acquired pneumonia 02/17/2018   Chronic radicular pain of lower back  02/06/2018   Numbness and tingling of right leg 02/06/2018   Hip dysplasia 02/23/2016   Osteoporosis, post-menopausal 02/23/2016   Raynaud's disease without gangrene 02/23/2016   Seropositive rheumatoid arthritis ([place]) 02/23/2016   Lumbar facet joint pain 09/18/2014   PCP:  [name], NP Pharmacy:   [facility] [place], [place] - [place] [facility] [address] AT [address] [facility] [place] [address] Phone: [phone] Fax: [phone]     Social Determinants of Health (SDOH) Interventions    Readmission Risk Interventions No flowsheet data found.

## 2020-10-15 NOTE — Progress Notes (Signed)
Patient is stable and ready for discharge to go home with H/H services. Patient's IV removed. Writer went over discharge paperwork with patient and she verbalized understanding and her hard script prescriptions placed in discharge packet. Patient was assisted by NT to get dressed and belongings packed. Patient's husband is picking patient up. Patient was sent with 3 in 1, polar care, and bone foam. Patient was transported via [facility] by NT and writer to patient's car.

## 2020-10-15 NOTE — Discharge Summary (Signed)
Physician Discharge Summary  Subjective: 2 Days Post-Op Procedure(s) (LRB): COMPUTER ASSISTED TOTAL KNEE ARTHROPLASTY - RNFA (Right) Patient reports pain as moderate.   Patient seen in rounds with Dr. Marry Guan. Patient is well, and has had no acute complaints or problems Patient is ready to go home after physical therapy.  She is working on a bowel movement.  Physician Discharge Summary  Patient ID: Tonya Cummings MRN: [id number] DOB/AGE: Mar 05, 1951 69 y.o.  Admit date: 10/13/2020 Discharge date: 10/15/2020  Admission Diagnoses:  Discharge Diagnoses:  Active Problems:   Total knee replacement status   Discharged Condition: fair  Hospital Course: Patient is postop day 2 from a right total knee arthroplasty.  She has done very well since surgery.  She ambulated to the gym and has ambulated up 4 stairs already.  The patient is having better pain control.  She is ready to go home with home health physical therapy.  Treatments: surgery:   Right total knee arthroplasty using computer-assisted navigation  SURGEON:  Marciano Sequin. M.D.  ANESTHESIA: spinal  ESTIMATED BLOOD LOSS: 50 mL  FLUIDS REPLACED: 800 mL of crystalloid  TOURNIQUET TIME: 95 minutes  DRAINS: 2 medium Hemovac  SOFT TISSUE RELEASES: Anterior cruciate ligament, posterior cruciate ligament, deep medial collateral ligament, patellofemoral ligament, posterolateral corner  IMPLANTS UTILIZED: DePuy Attune size 4 posterior stabilized femoral component (cemented), size 4 rotating platform tibial component (cemented), 35 mm medialized dome patella (cemented), and a 5 mm stabilized rotating platform polyethylene insert.  Discharge Exam: Blood pressure (!) 129/52, pulse 79, temperature 97.6 F (36.4 C), temperature source Oral, resp. rate 18, height 5' 3.5" (1.613 m), weight 102.1 kg, SpO2 97 %.   Disposition: Discharge disposition: 01-Home or Self Care        Allergies as of 10/15/2020       Reactions   Gabapentin Other (See Comments)   Weight gain   Ibuprofen Other (See Comments)   Headache      Medication List    STOP taking these medications   HYDROcodone-acetaminophen 5-325 MG tablet Commonly known as: NORCO/VICODIN     TAKE these medications   ASCORBIC ACID PO Take 1 tablet by mouth daily.   Calcium Carbonate-Vitamin D 600-400 MG-UNIT chew tablet Chew 1 tablet by mouth 2 (two) times daily.   celecoxib 200 MG capsule Commonly known as: CELEBREX Take 1 capsule (200 mg total) by mouth 2 (two) times daily.   Cholecalciferol 50 MCG (2000 UT) Caps Take 2,000 Units by mouth daily.   cyclobenzaprine 10 MG tablet Commonly known as: FLEXERIL Take 10 mg by mouth at bedtime.   enalapril-hydrochlorothiazide 10-25 MG tablet Commonly known as: VASERETIC Take 1 tablet by mouth daily.   enoxaparin 40 MG/0.4ML injection Commonly known as: LOVENOX Inject 0.4 mLs (40 mg total) into the skin daily for 14 days.   Excedrin Tension Headache 500-65 MG Tabs Generic drug: Acetaminophen-Caffeine Take 1 tablet by mouth daily as needed (Headache).   FLUoxetine 40 MG capsule Commonly known as: PROZAC Take 40 mg by mouth daily.   folic acid 1 MG tablet Commonly known as: FOLVITE Take 1 mg by mouth daily.   Humira 40 MG/0.4ML Pskt Generic drug: Adalimumab Inject 40 mg into the skin every 14 (fourteen) days.   hydroxychloroquine 200 MG tablet Commonly known as: PLAQUENIL Take 200 mg by mouth 2 (two) times daily.   methotrexate 1 g injection Commonly known as: 50 mg/ml Inject 25 mg into the vein once a week. .6 ml  MULTIVITAMIN ADULT PO Take 1 tablet by mouth daily.   omeprazole 40 MG capsule Commonly known as: PRILOSEC Take 40 mg by mouth 2 (two) times daily.   oxyCODONE 5 MG immediate release tablet Commonly known as: Oxy IR/ROXICODONE Take 1-2 tablets (5-10 mg total) by mouth every 4 (four) hours as needed for moderate pain (pain score 4-6).   pramipexole  0.125 MG tablet Commonly known as: MIRAPEX Take 0.125 mg by mouth daily. Take 0.125 mg in the morning and 2 at bedtime   rosuvastatin 5 MG tablet Commonly known as: CRESTOR Take 5 mg by mouth every other day.   Systane Balance 0.6 % Soln Generic drug: Propylene Glycol Place 1 drop into both eyes daily as needed (Dry eye).   traMADol 50 MG tablet Commonly known as: ULTRAM Take 1-2 tablets (50-100 mg total) by mouth every 4 (four) hours as needed for moderate pain.   traZODone 50 MG tablet Commonly known as: DESYREL Take 50 mg by mouth at bedtime.            Durable Medical Equipment  (From admission, onward)         Start     Ordered   10/13/20 1641  DME Walker rolling  Once       Question:  Patient needs a walker to treat with the following condition  Answer:  Total knee replacement status   10/13/20 1640   10/13/20 1641  DME Bedside commode  Once       Question:  Patient needs a bedside commode to treat with the following condition  Answer:  Total knee replacement status   10/13/20 1640          Follow-up Information    [facility] On 10/28/2020.   Specialty: Orthopedic Surgery Why: at 1:15pm Contact information: [place] [address] [phone]        Dereck Leep, MD On 11/25/2020.   Specialty: Orthopedic Surgery Why: at 2:00pm Contact information: [address] [facility] [place] [address] [phone]               Signed: Prescott Parma, Nilsa Macht 10/15/2020, 7:15 AM   Objective: Vital signs in last 24 hours: Temp:  [97.6 F (36.4 C)-98.4 F (36.9 C)] 97.6 F (36.4 C) (11/03 0425) Pulse Rate:  [78-85] 79 (11/03 0425) Resp:  [15-20] 18 (11/03 0425) BP: (110-130)/(50-64) 129/52 (11/03 0425) SpO2:  [97 %-100 %] 97 % (11/03 0425)  Intake/Output from previous day:  Intake/Output Summary (Last 24 hours) at 10/15/2020 0715 Last data  filed at 10/14/2020 1510 Gross per 24 hour  Intake 374.67 ml  Output 70 ml  Net 304.67 ml    Intake/Output this shift: No intake/output data recorded.  Labs: No results for input(s): HGB in the last 72 hours. No results for input(s): WBC, RBC, HCT, PLT in the last 72 hours. No results for input(s): NA, K, CL, CO2, BUN, CREATININE, GLUCOSE, CALCIUM in the last 72 hours. No results for input(s): LABPT, INR in the last 72 hours.  EXAM: General - Patient is Alert and Oriented Extremity - Neurovascular intact Sensation intact distally Compartment soft Incision - clean, dry, no drainage, with a Hemovac removed with no complication.  A new honeycomb dressing was applied. Motor Function -plantarflexion and dorsiflexion are intact.  Able to straight leg raise independently.  Assessment/Plan: 2 Days Post-Op Procedure(s) (LRB): COMPUTER ASSISTED TOTAL KNEE ARTHROPLASTY - RNFA (Right) Procedure(s) (  LRB): COMPUTER ASSISTED TOTAL KNEE ARTHROPLASTY - RNFA (Right) Past Medical History:  Diagnosis Date  . Anxiety   . Chronic kidney disease   . Chronic pain   . Chronic radicular pain of lower back   . Costochondritis   . Depression   . Encounter for blood transfusion   . GERD (gastroesophageal reflux disease)   . Headache    migraines  . Hip dysplasia   . Hyperlipidemia   . Hypertension   . Iron deficiency anemia   . Low back pain   . Low vitamin B12 level   . Obesity   . Osteoporosis   . Pelvic fracture ([place])   . Raynaud's disease without gangrene   . Restless leg syndrome   . Rheumatoid aortitis   . Rheumatoid arthritis (HCC)    polyarthritis  . Rheumatoid arthritis ([place])   . Seasonal allergies   . Sleep disorder   . Thoracic compression fracture [facility])    Active Problems:   Total knee replacement status  Estimated body mass index is 39.23 kg/m as calculated from the following:   Height as of this encounter: 5' 3.5" (1.613 m).   Weight as of this encounter: 102.1  kg. Advance diet Up with therapy Discharge home with home health Diet - Regular diet Follow up - in 2 weeks Activity - WBAT Disposition - Home Condition Upon Discharge - Stable DVT Prophylaxis - Lovenox and TED hose  Reche Dixon, PA-C Orthopaedic Surgery 10/15/2020, 7:15 AM

## 2020-10-16 ENCOUNTER — Encounter: Payer: Self-pay | Admitting: Orthopedic Surgery

## 2020-10-16 DIAGNOSIS — Z471 Aftercare following joint replacement surgery: Secondary | ICD-10-CM | POA: Diagnosis not present

## 2020-10-16 DIAGNOSIS — M150 Primary generalized (osteo)arthritis: Secondary | ICD-10-CM | POA: Diagnosis not present

## 2020-10-16 DIAGNOSIS — M940 Chondrocostal junction syndrome [Tietze]: Secondary | ICD-10-CM | POA: Diagnosis not present

## 2020-10-16 DIAGNOSIS — G2581 Restless legs syndrome: Secondary | ICD-10-CM | POA: Diagnosis not present

## 2020-10-16 DIAGNOSIS — N183 Chronic kidney disease, stage 3 unspecified: Secondary | ICD-10-CM | POA: Diagnosis not present

## 2020-10-16 DIAGNOSIS — F418 Other specified anxiety disorders: Secondary | ICD-10-CM | POA: Diagnosis not present

## 2020-10-16 DIAGNOSIS — Q6589 Other specified congenital deformities of hip: Secondary | ICD-10-CM | POA: Diagnosis not present

## 2020-10-16 DIAGNOSIS — I129 Hypertensive chronic kidney disease with stage 1 through stage 4 chronic kidney disease, or unspecified chronic kidney disease: Secondary | ICD-10-CM | POA: Diagnosis not present

## 2020-10-16 DIAGNOSIS — M059 Rheumatoid arthritis with rheumatoid factor, unspecified: Secondary | ICD-10-CM | POA: Diagnosis not present

## 2020-10-20 DIAGNOSIS — N183 Chronic kidney disease, stage 3 unspecified: Secondary | ICD-10-CM | POA: Diagnosis not present

## 2020-10-20 DIAGNOSIS — M150 Primary generalized (osteo)arthritis: Secondary | ICD-10-CM | POA: Diagnosis not present

## 2020-10-20 DIAGNOSIS — Q6589 Other specified congenital deformities of hip: Secondary | ICD-10-CM | POA: Diagnosis not present

## 2020-10-20 DIAGNOSIS — I129 Hypertensive chronic kidney disease with stage 1 through stage 4 chronic kidney disease, or unspecified chronic kidney disease: Secondary | ICD-10-CM | POA: Diagnosis not present

## 2020-10-20 DIAGNOSIS — F418 Other specified anxiety disorders: Secondary | ICD-10-CM | POA: Diagnosis not present

## 2020-10-20 DIAGNOSIS — G2581 Restless legs syndrome: Secondary | ICD-10-CM | POA: Diagnosis not present

## 2020-10-20 DIAGNOSIS — M059 Rheumatoid arthritis with rheumatoid factor, unspecified: Secondary | ICD-10-CM | POA: Diagnosis not present

## 2020-10-20 DIAGNOSIS — Z471 Aftercare following joint replacement surgery: Secondary | ICD-10-CM | POA: Diagnosis not present

## 2020-10-20 DIAGNOSIS — M940 Chondrocostal junction syndrome [Tietze]: Secondary | ICD-10-CM | POA: Diagnosis not present

## 2020-10-22 DIAGNOSIS — I129 Hypertensive chronic kidney disease with stage 1 through stage 4 chronic kidney disease, or unspecified chronic kidney disease: Secondary | ICD-10-CM | POA: Diagnosis not present

## 2020-10-22 DIAGNOSIS — M059 Rheumatoid arthritis with rheumatoid factor, unspecified: Secondary | ICD-10-CM | POA: Diagnosis not present

## 2020-10-22 DIAGNOSIS — M150 Primary generalized (osteo)arthritis: Secondary | ICD-10-CM | POA: Diagnosis not present

## 2020-10-22 DIAGNOSIS — Z471 Aftercare following joint replacement surgery: Secondary | ICD-10-CM | POA: Diagnosis not present

## 2020-10-22 DIAGNOSIS — Q6589 Other specified congenital deformities of hip: Secondary | ICD-10-CM | POA: Diagnosis not present

## 2020-10-22 DIAGNOSIS — N183 Chronic kidney disease, stage 3 unspecified: Secondary | ICD-10-CM | POA: Diagnosis not present

## 2020-10-22 DIAGNOSIS — M940 Chondrocostal junction syndrome [Tietze]: Secondary | ICD-10-CM | POA: Diagnosis not present

## 2020-10-22 DIAGNOSIS — G2581 Restless legs syndrome: Secondary | ICD-10-CM | POA: Diagnosis not present

## 2020-10-22 DIAGNOSIS — F418 Other specified anxiety disorders: Secondary | ICD-10-CM | POA: Diagnosis not present

## 2020-10-24 DIAGNOSIS — M940 Chondrocostal junction syndrome [Tietze]: Secondary | ICD-10-CM | POA: Diagnosis not present

## 2020-10-24 DIAGNOSIS — Z471 Aftercare following joint replacement surgery: Secondary | ICD-10-CM | POA: Diagnosis not present

## 2020-10-24 DIAGNOSIS — I129 Hypertensive chronic kidney disease with stage 1 through stage 4 chronic kidney disease, or unspecified chronic kidney disease: Secondary | ICD-10-CM | POA: Diagnosis not present

## 2020-10-24 DIAGNOSIS — Q6589 Other specified congenital deformities of hip: Secondary | ICD-10-CM | POA: Diagnosis not present

## 2020-10-24 DIAGNOSIS — N183 Chronic kidney disease, stage 3 unspecified: Secondary | ICD-10-CM | POA: Diagnosis not present

## 2020-10-24 DIAGNOSIS — F418 Other specified anxiety disorders: Secondary | ICD-10-CM | POA: Diagnosis not present

## 2020-10-24 DIAGNOSIS — G2581 Restless legs syndrome: Secondary | ICD-10-CM | POA: Diagnosis not present

## 2020-10-24 DIAGNOSIS — M150 Primary generalized (osteo)arthritis: Secondary | ICD-10-CM | POA: Diagnosis not present

## 2020-10-24 DIAGNOSIS — M059 Rheumatoid arthritis with rheumatoid factor, unspecified: Secondary | ICD-10-CM | POA: Diagnosis not present

## 2020-10-27 ENCOUNTER — Ambulatory Visit: Payer: Medicare HMO | Admitting: Hematology and Oncology

## 2020-10-27 ENCOUNTER — Other Ambulatory Visit: Payer: Medicare HMO

## 2020-10-27 ENCOUNTER — Inpatient Hospital Stay: Payer: Medicare HMO | Attending: Hematology and Oncology | Admitting: Hematology and Oncology

## 2020-10-27 ENCOUNTER — Ambulatory Visit: Payer: Medicare HMO

## 2020-10-27 DIAGNOSIS — I129 Hypertensive chronic kidney disease with stage 1 through stage 4 chronic kidney disease, or unspecified chronic kidney disease: Secondary | ICD-10-CM | POA: Diagnosis not present

## 2020-10-27 DIAGNOSIS — Q6589 Other specified congenital deformities of hip: Secondary | ICD-10-CM | POA: Diagnosis not present

## 2020-10-27 DIAGNOSIS — N183 Chronic kidney disease, stage 3 unspecified: Secondary | ICD-10-CM | POA: Diagnosis not present

## 2020-10-27 DIAGNOSIS — M940 Chondrocostal junction syndrome [Tietze]: Secondary | ICD-10-CM | POA: Diagnosis not present

## 2020-10-27 DIAGNOSIS — G2581 Restless legs syndrome: Secondary | ICD-10-CM | POA: Diagnosis not present

## 2020-10-27 DIAGNOSIS — M059 Rheumatoid arthritis with rheumatoid factor, unspecified: Secondary | ICD-10-CM | POA: Diagnosis not present

## 2020-10-27 DIAGNOSIS — Z471 Aftercare following joint replacement surgery: Secondary | ICD-10-CM | POA: Diagnosis not present

## 2020-10-27 DIAGNOSIS — M150 Primary generalized (osteo)arthritis: Secondary | ICD-10-CM | POA: Diagnosis not present

## 2020-10-27 DIAGNOSIS — F418 Other specified anxiety disorders: Secondary | ICD-10-CM | POA: Diagnosis not present

## 2020-10-28 DIAGNOSIS — M25661 Stiffness of right knee, not elsewhere classified: Secondary | ICD-10-CM | POA: Diagnosis not present

## 2020-10-28 DIAGNOSIS — M6281 Muscle weakness (generalized): Secondary | ICD-10-CM | POA: Diagnosis not present

## 2020-10-28 DIAGNOSIS — Z96651 Presence of right artificial knee joint: Secondary | ICD-10-CM | POA: Diagnosis not present

## 2020-10-28 DIAGNOSIS — M25561 Pain in right knee: Secondary | ICD-10-CM | POA: Diagnosis not present

## 2020-10-30 DIAGNOSIS — M6281 Muscle weakness (generalized): Secondary | ICD-10-CM | POA: Diagnosis not present

## 2020-10-30 DIAGNOSIS — M25561 Pain in right knee: Secondary | ICD-10-CM | POA: Diagnosis not present

## 2020-10-30 DIAGNOSIS — Z96651 Presence of right artificial knee joint: Secondary | ICD-10-CM | POA: Diagnosis not present

## 2020-10-30 DIAGNOSIS — M25661 Stiffness of right knee, not elsewhere classified: Secondary | ICD-10-CM | POA: Diagnosis not present

## 2020-11-04 DIAGNOSIS — M25561 Pain in right knee: Secondary | ICD-10-CM | POA: Diagnosis not present

## 2020-11-04 DIAGNOSIS — Z96651 Presence of right artificial knee joint: Secondary | ICD-10-CM | POA: Diagnosis not present

## 2020-11-04 DIAGNOSIS — M25661 Stiffness of right knee, not elsewhere classified: Secondary | ICD-10-CM | POA: Diagnosis not present

## 2020-11-04 DIAGNOSIS — M6281 Muscle weakness (generalized): Secondary | ICD-10-CM | POA: Diagnosis not present

## 2020-11-05 DIAGNOSIS — M25561 Pain in right knee: Secondary | ICD-10-CM | POA: Diagnosis not present

## 2020-11-05 DIAGNOSIS — Z96651 Presence of right artificial knee joint: Secondary | ICD-10-CM | POA: Diagnosis not present

## 2020-11-05 DIAGNOSIS — M25661 Stiffness of right knee, not elsewhere classified: Secondary | ICD-10-CM | POA: Diagnosis not present

## 2020-11-05 DIAGNOSIS — M6281 Muscle weakness (generalized): Secondary | ICD-10-CM | POA: Diagnosis not present

## 2020-11-10 DIAGNOSIS — Z96651 Presence of right artificial knee joint: Secondary | ICD-10-CM | POA: Diagnosis not present

## 2020-11-10 DIAGNOSIS — M6281 Muscle weakness (generalized): Secondary | ICD-10-CM | POA: Diagnosis not present

## 2020-11-10 DIAGNOSIS — M25561 Pain in right knee: Secondary | ICD-10-CM | POA: Diagnosis not present

## 2020-11-10 DIAGNOSIS — M25661 Stiffness of right knee, not elsewhere classified: Secondary | ICD-10-CM | POA: Diagnosis not present

## 2020-11-12 DIAGNOSIS — Z471 Aftercare following joint replacement surgery: Secondary | ICD-10-CM | POA: Diagnosis not present

## 2020-11-15 NOTE — Progress Notes (Signed)
[facility]  [address], [address] [place], [place] [address] Phone: [phone]  Fax: [phone]   Clinic Day:  11/17/2020  Referring physician: [name], *  Chief Complaint: Tonya Cummings is a 69 y.o. female with rheumatoid arthritis and iron deficiency anemia who is seen for 5 week assessment.  HPI: The patient was last seen in the hematology clinic on 10/02/2020. At that time, she felt "fine".  She denied any bleeding. Hematocrit was 31.8, hemoglobin 10.1, MCV 96.4, platelets 297,000, WBC 7,700. Ferritin was 70. Sed rate was 47. She continued oral B12 500 mcg.  She received Venofer.  The patient saw Stephens November, NP on 10/06/2020. She was doing well.  She was taking omeprazole.  She denied any abdominal pain, dyspepsia, nausea or vomiting.  Recommendation was VCE.  The patient wished to postpone until after her knee surgery.  She underwent computer assisted total right knee replacement on 10/13/2020 by Dr. Marry Guan.  Estimated blood loss was 50 ml.  During the interim, she has been "good." She is having right leg pain today. She is going to bring this up with PT on Wednesday. She denies any recent falls. She restarted oral B12 after her knee surgery.  The patient has been eating a little bit less than usual. She has started eating more salads but does not like to eat red meat. Her shortness of breath on exertion and insomnia are stable. She denies nose bleeds, gum bleeds, hematuria, and blood in the stool.  The patient is unsure when she is going to do the capsule study because she has a lot of medical bills right now.   Past Medical History:  Diagnosis Date  . Anxiety   . Chronic kidney disease   . Chronic pain   . Chronic radicular pain of lower back   . Costochondritis   . Depression   . Encounter for blood transfusion   . GERD (gastroesophageal reflux disease)   . Headache    migraines  . Hip dysplasia   . Hyperlipidemia    . Hypertension   . Iron deficiency anemia   . Low back pain   . Low vitamin B12 level   . Obesity   . Osteoporosis   . Pelvic fracture ([place])   . Raynaud's disease without gangrene   . Restless leg syndrome   . Rheumatoid aortitis   . Rheumatoid arthritis (HCC)    polyarthritis  . Rheumatoid arthritis ([place])   . Seasonal allergies   . Sleep disorder   . Thoracic compression fracture [facility])     Past Surgical History:  Procedure Laterality Date  . ABDOMINAL SURGERY     pt denies  . APPENDECTOMY    . CARPAL TUNNEL RELEASE Bilateral   . CHOLECYSTECTOMY    . COLONOSCOPY WITH PROPOFOL N/A 08/15/2020   Procedure: COLONOSCOPY WITH PROPOFOL;  Surgeon: Robert Bellow, MD;  Location: [facility] ENDOSCOPY;  Service: Endoscopy;  Laterality: N/A;  . DILATION AND CURETTAGE OF UTERUS    . ESOPHAGOGASTRODUODENOSCOPY (EGD) WITH PROPOFOL N/A 08/15/2020   Procedure: ESOPHAGOGASTRODUODENOSCOPY (EGD) WITH PROPOFOL;  Surgeon: Robert Bellow, MD;  Location: [facility] ENDOSCOPY;  Service: Endoscopy;  Laterality: N/A;  . FRACTURE SURGERY     hip fracture   . FRACTURE SURGERY     pelvic fracture plate   . HIP SURGERY Left   . KNEE ARTHROPLASTY Right 10/13/2020   Procedure: COMPUTER ASSISTED TOTAL KNEE ARTHROPLASTY - RNFA;  Surgeon: Dereck Leep, MD;  Location: [facility] ORS;  Service: Orthopedics;  Laterality: Right;  . TUBAL LIGATION      Family History  Problem Relation Age of Onset  . Diabetes Mother   . Hypertension Mother   . Aneurysm Father   . Diabetes Son   . Seizures Son   . Osteosarcoma Brother   . Cancer Brother   . Diabetes Brother   . Diabetes Maternal Grandfather   . Heart disease Maternal Grandfather     Social History:  reports that she has never smoked. She has never used smokeless tobacco. She reports that she does not drink alcohol and does not use drugs. She has no known exposure to chemicals or radiation.Her husband's name is Herbie Baltimore. They have 3 children. She has been disabled  since 2015. She previously worked in [employer]. She lives in [place] her husband, son, and eldest daughter. The patient is alone today.   Allergies:  Allergies  Allergen Reactions  . Gabapentin Other (See Comments)    Weight gain  . Ibuprofen Other (See Comments)    Headache    Current Medications: Current Outpatient Medications  Medication Sig Dispense Refill  . Acetaminophen-Caffeine (EXCEDRIN TENSION HEADACHE) 500-65 MG TABS Take 1 tablet by mouth daily as needed (Headache).    . Adalimumab (HUMIRA) 40 MG/0.4ML PSKT Inject 40 mg into the skin every 14 (fourteen) days.     . ASCORBIC ACID PO Take 1 tablet by mouth daily.     . Calcium Carbonate-Vitamin D 600-400 MG-UNIT chew tablet Chew 1 tablet by mouth 2 (two) times daily.     . celecoxib (CELEBREX) 200 MG capsule Take 1 capsule (200 mg total) by mouth 2 (two) times daily. 60 capsule 1  . Cholecalciferol 50 MCG (2000 UT) CAPS Take 2,000 Units by mouth daily.    . cyclobenzaprine (FLEXERIL) 10 MG tablet Take 10 mg by mouth at bedtime.    . enalapril-hydrochlorothiazide (VASERETIC) 10-25 MG per tablet Take 1 tablet by mouth daily.    Marland Kitchen FLUoxetine (PROZAC) 40 MG capsule Take 40 mg by mouth daily.    . folic acid (FOLVITE) 1 MG tablet Take 1 mg by mouth daily.    . hydroxychloroquine (PLAQUENIL) 200 MG tablet Take 200 mg by mouth 2 (two) times daily.    . methotrexate (50 MG/ML) 1 g injection Inject 25 mg into the vein once a week. .6 ml    . Multiple Vitamins-Minerals (MULTIVITAMIN ADULT PO) Take 1 tablet by mouth daily.     Marland Kitchen omeprazole (PRILOSEC) 40 MG capsule Take 40 mg by mouth 2 (two) times daily.     Marland Kitchen oxyCODONE (OXY IR/ROXICODONE) 5 MG immediate release tablet Take 1-2 tablets (5-10 mg total) by mouth every 4 (four) hours as needed for moderate pain (pain score 4-6). 30 tablet 0  . pramipexole (MIRAPEX) 0.125 MG tablet Take 0.125 mg by mouth daily. Take 0.125 mg in the morning and 2 at bedtime    . rosuvastatin  (CRESTOR) 5 MG tablet Take 5 mg by mouth every other day.     . traMADol (ULTRAM) 50 MG tablet Take 1-2 tablets (50-100 mg total) by mouth every 4 (four) hours as needed for moderate pain. 30 tablet 0  . traZODone (DESYREL) 50 MG tablet Take 50 mg by mouth at bedtime.      No current facility-administered medications for this visit.    Review of Systems  Constitutional: Positive for weight loss (6 lbs). Negative for chills, diaphoresis, fever and malaise/fatigue.  Feels "good."  HENT: Negative.  Negative for congestion, ear discharge, ear pain, hearing loss, nosebleeds, sinus pain, sore throat and tinnitus.   Eyes: Negative for blurred vision.  Respiratory: Positive for shortness of breath (with exertion). Negative for cough, hemoptysis and sputum production.   Cardiovascular: Negative.  Negative for chest pain, palpitations and orthopnea.  Gastrointestinal: Negative for abdominal pain, blood in stool, constipation, diarrhea, heartburn (on omeprazole), melena, nausea and vomiting.       Eating a little bit less than usual. Does not eat a lot of iron rich foods.  Genitourinary: Negative.  Negative for dysuria, flank pain, frequency, hematuria and urgency.  Musculoskeletal: Positive for joint pain (right knee). Negative for back pain, falls, myalgias and neck pain.  Skin: Negative.  Negative for itching and rash.  Neurological: Negative for dizziness, tingling, tremors, sensory change, speech change, focal weakness, seizures, weakness and headaches.  Endo/Heme/Allergies: Negative.  Does not bruise/bleed easily.  Psychiatric/Behavioral: Negative for depression and memory loss. The patient has insomnia (wakes up 2-3 times at night). The patient is not nervous/anxious.   All other systems reviewed and are negative.  Performance status (ECOG):  1  Vitals Blood pressure (!) 168/84, pulse 98, temperature 98 F (36.7 C), temperature source Tympanic, resp. rate 20, weight 221 lb 9 oz (100.5  kg), SpO2 95 %.  Physical Exam Vitals and nursing note reviewed.  Constitutional:      General: She is not in acute distress.    Appearance: She is well-developed. She is not diaphoretic.     Interventions: Face mask in place.     Comments: Patient required assistance onto exam table. She has a cane by her side.  HENT:     Head: Normocephalic and atraumatic.     Comments: Long brown hair.    Mouth/Throat:     Mouth: Mucous membranes are moist.     Pharynx: Oropharynx is clear. No oropharyngeal exudate.  Eyes:     General: No scleral icterus.    Extraocular Movements: Extraocular movements intact.     Conjunctiva/sclera: Conjunctivae normal.     Pupils: Pupils are equal, round, and reactive to light.     Comments: Glasses.  Blue eyes.  Neck:     Vascular: No JVD.  Cardiovascular:     Rate and Rhythm: Normal rate and regular rhythm.     Heart sounds: Normal heart sounds. No murmur heard. No gallop.   Pulmonary:     Effort: Pulmonary effort is normal. No respiratory distress.     Breath sounds: Normal breath sounds. No wheezing or rales.  Chest:     Chest wall: No tenderness.  Breasts:     Right: No supraclavicular adenopathy.     Left: No supraclavicular adenopathy.    Abdominal:     General: Bowel sounds are normal. There is no distension.     Palpations: Abdomen is soft. There is no mass.     Tenderness: There is no abdominal tenderness. There is no guarding or rebound.  Musculoskeletal:        General: Swelling (right knee) present. No tenderness. Normal range of motion.     Cervical back: Normal range of motion and neck supple.     Left lower leg: Edema present.  Lymphadenopathy:     Head:     Right side of head: No preauricular, posterior auricular or occipital adenopathy.     Left side of head: No preauricular, posterior auricular or occipital adenopathy.     Cervical:  No cervical adenopathy.     Upper Body:     Right upper body: No supraclavicular adenopathy.      Left upper body: No supraclavicular adenopathy.     Lower Body: No right inguinal adenopathy. No left inguinal adenopathy.  Skin:    General: Skin is warm and dry.     Coloration: Skin is not pale.     Findings: No erythema or rash.     Comments: Right knee feels warmer than left knee.  Neurological:     Mental Status: She is alert and oriented to person, place, and time.  Psychiatric:        Behavior: Behavior normal.        Thought Content: Thought content normal.        Judgment: Judgment normal.    Appointment on 11/17/2020  Component Date Value Ref Range Status  . WBC 11/17/2020 9.9  4.0 - 10.5 K/uL Final  . RBC 11/17/2020 3.07* 3.87 - 5.11 MIL/uL Final  . Hemoglobin 11/17/2020 9.1* 12.0 - 15.0 g/dL Final  . HCT 11/17/2020 28.6* 36 - 46 % Final  . MCV 11/17/2020 93.2  80.0 - 100.0 fL Final  . MCH 11/17/2020 29.6  26.0 - 34.0 pg Final  . MCHC 11/17/2020 31.8  30.0 - 36.0 g/dL Final  . RDW 11/17/2020 14.5  11.5 - 15.5 % Final  . Platelets 11/17/2020 319  150 - 400 K/uL Final  . nRBC 11/17/2020 0.0  0.0 - 0.2 % Final   Performed at [facility], [address], [place], [place] [address]    Assessment:  Tonya Cummings is a 69 y.o. female withseropositive rheumatoid arthritisand a progressive normocytic anemia. She was initially diagnosed in 2000. She is currently on methotrexate SQ oncea week, Plaquenil 200 mg twice daily and Humira 40 mgeveryother week (since 05/2016). She previously received Morrie Sheldon (2014/2015-02/2016) and Enbrel 25 mg biweekly. Shetakes folic acid1 mg a day.   Work-up on 09/07/2019 revealed a hematocrit of 26.2, hemoglobin 8.0, MCV 82.4, platelets 351,000, white count 7900 with an ANC of 5000.  Ferritin was 10 with an iron saturation of 6% and a TIBC of 428.  CRP and sed rate were levated.  B12 was 339 (low normal).  Normal studies included:  folate (> 100), TSH, and haptoglobin.  LDH was 209.  Retic was 1.9%.  She has iron  deficiency.  She received Venofer weekly x 3 (10/03/2019 - 10/16/2019), x 2 (05/02/2020 - 05/07/2020), and 07/01/2020.  Ferritin has been followed:  10 on 09/07/2019, 16 on 09/26/2019, 19 on 10/03/2019, 191 on 10/23/2019, 40 on 12/24/2019, and 14 on 02/26/2020.  Colonoscopy on 08/15/2020 revealed one 5 mm polyp in the proximal ascending colon (tubular adenoma). There was diverticulosis in the sigmoid colon. EGD was normal.  She has B12 deficiency.  B12 was 339 on 09/07/2019.  She is on oral B12 500 mcg a day.  B12 was 604 on 10/23/2019.  Folate was > 100 on 09/07/2019.  She has stage III chronic kidney disease.Creatinine is 1.3 (CrCl 41 ml/min).  She underwent computer assisted total right knee replacement on 10/13/2020  Symptomatically, she feels "good." She has right leg pain.  She has been eating a little bit less than usual; weight is down 6 pounds. She denies nose bleeds, gum bleeds, hematuria, and blood in the stool.  Plan: 1.   Labs today: CBC, ferritin, iron studies, sed rate, B12, folate 2. Iron deficiency anemia  Symptomatically, she feels "good".  She denies any bleeding..    Hematocrit 31.8.  Hemoglobin 10.1.  MCV 96.4 on 10/01/2020.   Ferritin 70.  Sed rate 47.  Hematocrit 28.6.  Hemoglobin 9.1.  MCV 93.2 on 11/17/2020.   Ferritin 65 with an iron saturation of 11% and a TIBC of 301 (available after clinic).   Sed rate 66 (high) and likely falsely elevates ferritin.  Ferritin goal 100.  She last received IV iron on 07/01/2020.  She denies any bleeding.  Colonoscopy and EGD from 08/15/2020 revealed no evidence of bleeding.  No Venofer today. 3.   B12 deficiency             B12 was 339 on 09/07/2019 and 980 on 11/17/2020             B12 goal is 400.             She remains on B12 500 mcg a day.             Folate > 100.  Check B12 and folate annually. 4.   RN:  Call patient with lab results and if Venofer needed. 5.   RTC in 6 weeks for labs (CBC, ferritin,  iron studies, folate). 6.   RTC in 3 months for MD assessment, labs (CBC, ferritin, sed rate-day before) and +/- Venofer.  Addendum: Given declining hemoglobin with a ferritin of 65 (falsely elevated; sed rate 66) and an iron saturation of 11% (low) patient will be contacted regarding Venofer weekly x 2-3.  Folate was > 100.  The patient was contacted.  She was instructed to stop folic acid for 2-3 weeks then begin taking folic acid 3 days a week.  I discussed the assessment and treatment plan with the patient.  The patient was provided an opportunity to ask questions and all were answered.  The patient agreed with the plan and demonstrated an understanding of the instructions.  The patient was advised to call back if the symptoms worsen or if the condition fails to improve as anticipated.  I provided 13 minutes of face-to-face time during this encounter and > 50% was spent counseling as documented under my assessment and plan.  An additional 8-10 minutes were spent reviewing her chart (Epic and Care Everywhere) including notes, labs, and imaging studies.    Lequita Asal, MD, PhD    11/17/2020, 2:09 PM  I, Mirian Mo Tufford, am acting as a Education administrator for [employer]. Mike Gip, MD.   I, Jenisa Monty C. Mike Gip, MD, have reviewed the above documentation for accuracy and completeness, and I agree with the above.

## 2020-11-17 ENCOUNTER — Other Ambulatory Visit: Payer: Self-pay

## 2020-11-17 ENCOUNTER — Inpatient Hospital Stay (HOSPITAL_BASED_OUTPATIENT_CLINIC_OR_DEPARTMENT_OTHER): Payer: Medicare HMO | Admitting: Hematology and Oncology

## 2020-11-17 ENCOUNTER — Encounter: Payer: Self-pay | Admitting: Hematology and Oncology

## 2020-11-17 ENCOUNTER — Inpatient Hospital Stay: Payer: Medicare HMO

## 2020-11-17 ENCOUNTER — Inpatient Hospital Stay: Payer: Medicare HMO | Attending: Hematology and Oncology

## 2020-11-17 VITALS — BP 168/84 | HR 98 | Temp 98.0°F | Resp 20 | Wt 221.6 lb

## 2020-11-17 DIAGNOSIS — N183 Chronic kidney disease, stage 3 unspecified: Secondary | ICD-10-CM | POA: Diagnosis not present

## 2020-11-17 DIAGNOSIS — Z8249 Family history of ischemic heart disease and other diseases of the circulatory system: Secondary | ICD-10-CM | POA: Insufficient documentation

## 2020-11-17 DIAGNOSIS — Z820 Family history of epilepsy and other diseases of the nervous system: Secondary | ICD-10-CM | POA: Insufficient documentation

## 2020-11-17 DIAGNOSIS — D509 Iron deficiency anemia, unspecified: Secondary | ICD-10-CM

## 2020-11-17 DIAGNOSIS — D122 Benign neoplasm of ascending colon: Secondary | ICD-10-CM | POA: Insufficient documentation

## 2020-11-17 DIAGNOSIS — R0602 Shortness of breath: Secondary | ICD-10-CM | POA: Diagnosis not present

## 2020-11-17 DIAGNOSIS — G2581 Restless legs syndrome: Secondary | ICD-10-CM | POA: Diagnosis not present

## 2020-11-17 DIAGNOSIS — E538 Deficiency of other specified B group vitamins: Secondary | ICD-10-CM

## 2020-11-17 DIAGNOSIS — M79604 Pain in right leg: Secondary | ICD-10-CM | POA: Insufficient documentation

## 2020-11-17 DIAGNOSIS — Z8269 Family history of other diseases of the musculoskeletal system and connective tissue: Secondary | ICD-10-CM | POA: Diagnosis not present

## 2020-11-17 DIAGNOSIS — Z809 Family history of malignant neoplasm, unspecified: Secondary | ICD-10-CM | POA: Insufficient documentation

## 2020-11-17 DIAGNOSIS — K5730 Diverticulosis of large intestine without perforation or abscess without bleeding: Secondary | ICD-10-CM | POA: Insufficient documentation

## 2020-11-17 DIAGNOSIS — Z886 Allergy status to analgesic agent status: Secondary | ICD-10-CM | POA: Diagnosis not present

## 2020-11-17 DIAGNOSIS — G4700 Insomnia, unspecified: Secondary | ICD-10-CM | POA: Diagnosis not present

## 2020-11-17 DIAGNOSIS — I129 Hypertensive chronic kidney disease with stage 1 through stage 4 chronic kidney disease, or unspecified chronic kidney disease: Secondary | ICD-10-CM | POA: Insufficient documentation

## 2020-11-17 DIAGNOSIS — M069 Rheumatoid arthritis, unspecified: Secondary | ICD-10-CM | POA: Insufficient documentation

## 2020-11-17 DIAGNOSIS — Z833 Family history of diabetes mellitus: Secondary | ICD-10-CM | POA: Insufficient documentation

## 2020-11-17 DIAGNOSIS — Z888 Allergy status to other drugs, medicaments and biological substances status: Secondary | ICD-10-CM | POA: Insufficient documentation

## 2020-11-17 DIAGNOSIS — Z79899 Other long term (current) drug therapy: Secondary | ICD-10-CM | POA: Diagnosis not present

## 2020-11-17 DIAGNOSIS — M25561 Pain in right knee: Secondary | ICD-10-CM | POA: Diagnosis not present

## 2020-11-17 DIAGNOSIS — Z9049 Acquired absence of other specified parts of digestive tract: Secondary | ICD-10-CM | POA: Diagnosis not present

## 2020-11-17 LAB — CBC
HCT: 28.6 % — ABNORMAL LOW (ref 36.0–46.0)
Hemoglobin: 9.1 g/dL — ABNORMAL LOW (ref 12.0–15.0)
MCH: 29.6 pg (ref 26.0–34.0)
MCHC: 31.8 g/dL (ref 30.0–36.0)
MCV: 93.2 fL (ref 80.0–100.0)
Platelets: 319 10*3/uL (ref 150–400)
RBC: 3.07 MIL/uL — ABNORMAL LOW (ref 3.87–5.11)
RDW: 14.5 % (ref 11.5–15.5)
WBC: 9.9 10*3/uL (ref 4.0–10.5)
nRBC: 0 % (ref 0.0–0.2)

## 2020-11-17 LAB — SEDIMENTATION RATE: Sed Rate: 66 mm/hr — ABNORMAL HIGH (ref 0–30)

## 2020-11-18 ENCOUNTER — Telehealth: Payer: Self-pay | Admitting: *Deleted

## 2020-11-18 ENCOUNTER — Telehealth: Payer: Self-pay

## 2020-11-18 LAB — FERRITIN: Ferritin: 65 ng/mL (ref 11–307)

## 2020-11-18 LAB — FOLATE: Folate: >100 ng/mL (ref >5.9)

## 2020-11-18 LAB — VITAMIN B12: Vitamin B-12: 980 pg/mL — ABNORMAL HIGH (ref 180–914)

## 2020-11-18 LAB — IRON AND TIBC
Iron: 34 ug/dL (ref 28–170)
Saturation Ratios: 11 % (ref 10.4–31.8)
TIBC: 301 ug/dL (ref 250–450)
UIBC: 267 ug/dL

## 2020-11-18 NOTE — Telephone Encounter (Signed)
Lab called a corrected report of Folate, Changed results from 45 to 141

## 2020-11-18 NOTE — Telephone Encounter (Signed)
Spoke with patient about folic acid level being high (141). Instructed patient to stop taking Folic Acid for 2-3 weeks. After  to begin taking folic acid 3 days a week and we will recheck levels in 6 weeks.

## 2020-11-19 DIAGNOSIS — M25561 Pain in right knee: Secondary | ICD-10-CM | POA: Diagnosis not present

## 2020-11-25 DIAGNOSIS — Z96651 Presence of right artificial knee joint: Secondary | ICD-10-CM | POA: Diagnosis not present

## 2020-11-27 DIAGNOSIS — Z96651 Presence of right artificial knee joint: Secondary | ICD-10-CM | POA: Diagnosis not present

## 2020-11-27 DIAGNOSIS — M25661 Stiffness of right knee, not elsewhere classified: Secondary | ICD-10-CM | POA: Diagnosis not present

## 2020-11-27 DIAGNOSIS — M25561 Pain in right knee: Secondary | ICD-10-CM | POA: Diagnosis not present

## 2020-11-27 DIAGNOSIS — M6281 Muscle weakness (generalized): Secondary | ICD-10-CM | POA: Diagnosis not present

## 2020-11-28 DIAGNOSIS — N1832 Chronic kidney disease, stage 3b: Secondary | ICD-10-CM | POA: Diagnosis not present

## 2020-11-28 DIAGNOSIS — Z79899 Other long term (current) drug therapy: Secondary | ICD-10-CM | POA: Diagnosis not present

## 2020-11-28 DIAGNOSIS — M059 Rheumatoid arthritis with rheumatoid factor, unspecified: Secondary | ICD-10-CM | POA: Diagnosis not present

## 2020-11-28 DIAGNOSIS — M1711 Unilateral primary osteoarthritis, right knee: Secondary | ICD-10-CM | POA: Diagnosis not present

## 2020-12-01 DIAGNOSIS — Z96651 Presence of right artificial knee joint: Secondary | ICD-10-CM | POA: Diagnosis not present

## 2020-12-01 DIAGNOSIS — M25561 Pain in right knee: Secondary | ICD-10-CM | POA: Diagnosis not present

## 2020-12-01 DIAGNOSIS — M6281 Muscle weakness (generalized): Secondary | ICD-10-CM | POA: Diagnosis not present

## 2020-12-01 DIAGNOSIS — M25661 Stiffness of right knee, not elsewhere classified: Secondary | ICD-10-CM | POA: Diagnosis not present

## 2020-12-03 DIAGNOSIS — Z96651 Presence of right artificial knee joint: Secondary | ICD-10-CM | POA: Diagnosis not present

## 2020-12-03 DIAGNOSIS — M6281 Muscle weakness (generalized): Secondary | ICD-10-CM | POA: Diagnosis not present

## 2020-12-03 DIAGNOSIS — M25561 Pain in right knee: Secondary | ICD-10-CM | POA: Diagnosis not present

## 2020-12-03 DIAGNOSIS — M25661 Stiffness of right knee, not elsewhere classified: Secondary | ICD-10-CM | POA: Diagnosis not present

## 2020-12-08 DIAGNOSIS — M6281 Muscle weakness (generalized): Secondary | ICD-10-CM | POA: Diagnosis not present

## 2020-12-08 DIAGNOSIS — M25661 Stiffness of right knee, not elsewhere classified: Secondary | ICD-10-CM | POA: Diagnosis not present

## 2020-12-08 DIAGNOSIS — Z96651 Presence of right artificial knee joint: Secondary | ICD-10-CM | POA: Diagnosis not present

## 2020-12-08 DIAGNOSIS — M25561 Pain in right knee: Secondary | ICD-10-CM | POA: Diagnosis not present

## 2020-12-10 DIAGNOSIS — M25661 Stiffness of right knee, not elsewhere classified: Secondary | ICD-10-CM | POA: Diagnosis not present

## 2020-12-10 DIAGNOSIS — Z96651 Presence of right artificial knee joint: Secondary | ICD-10-CM | POA: Diagnosis not present

## 2020-12-10 DIAGNOSIS — M6281 Muscle weakness (generalized): Secondary | ICD-10-CM | POA: Diagnosis not present

## 2020-12-10 DIAGNOSIS — M25561 Pain in right knee: Secondary | ICD-10-CM | POA: Diagnosis not present

## 2020-12-18 ENCOUNTER — Other Ambulatory Visit: Payer: Self-pay

## 2020-12-18 ENCOUNTER — Inpatient Hospital Stay: Payer: Medicare HMO

## 2020-12-18 ENCOUNTER — Inpatient Hospital Stay: Payer: Medicare HMO | Attending: Hematology and Oncology

## 2020-12-18 VITALS — BP 164/84 | HR 86 | Temp 96.8°F | Resp 18

## 2020-12-18 DIAGNOSIS — D509 Iron deficiency anemia, unspecified: Secondary | ICD-10-CM | POA: Diagnosis not present

## 2020-12-18 DIAGNOSIS — E538 Deficiency of other specified B group vitamins: Secondary | ICD-10-CM | POA: Diagnosis not present

## 2020-12-18 DIAGNOSIS — M069 Rheumatoid arthritis, unspecified: Secondary | ICD-10-CM | POA: Insufficient documentation

## 2020-12-18 DIAGNOSIS — I129 Hypertensive chronic kidney disease with stage 1 through stage 4 chronic kidney disease, or unspecified chronic kidney disease: Secondary | ICD-10-CM | POA: Insufficient documentation

## 2020-12-18 DIAGNOSIS — D649 Anemia, unspecified: Secondary | ICD-10-CM

## 2020-12-18 DIAGNOSIS — Z79899 Other long term (current) drug therapy: Secondary | ICD-10-CM | POA: Insufficient documentation

## 2020-12-18 DIAGNOSIS — N183 Chronic kidney disease, stage 3 unspecified: Secondary | ICD-10-CM | POA: Insufficient documentation

## 2020-12-18 MED ORDER — IRON SUCROSE 20 MG/ML IV SOLN
200.0000 mg | Freq: Once | INTRAVENOUS | Status: AC
  Administered 2020-12-18: 200 mg via INTRAVENOUS
  Filled 2020-12-18: qty 10

## 2020-12-18 MED ORDER — SODIUM CHLORIDE 0.9 % IV SOLN: 200.0000 mg | Freq: Once | INTRAVENOUS | Status: DC

## 2020-12-18 MED ORDER — SODIUM CHLORIDE 0.9 % IV SOLN
Freq: Once | INTRAVENOUS | Status: AC
  Filled 2020-12-18: qty 250

## 2020-12-18 NOTE — Progress Notes (Signed)
Pt received prescribed treatment in clinic, pt stable at d/c. 

## 2020-12-25 ENCOUNTER — Inpatient Hospital Stay: Payer: Medicare HMO

## 2020-12-25 ENCOUNTER — Other Ambulatory Visit: Payer: Self-pay

## 2020-12-25 VITALS — BP 138/78 | HR 78 | Temp 97.5°F | Resp 16

## 2020-12-25 DIAGNOSIS — M069 Rheumatoid arthritis, unspecified: Secondary | ICD-10-CM | POA: Diagnosis not present

## 2020-12-25 DIAGNOSIS — D649 Anemia, unspecified: Secondary | ICD-10-CM

## 2020-12-25 DIAGNOSIS — E538 Deficiency of other specified B group vitamins: Secondary | ICD-10-CM

## 2020-12-25 DIAGNOSIS — N183 Chronic kidney disease, stage 3 unspecified: Secondary | ICD-10-CM | POA: Diagnosis not present

## 2020-12-25 DIAGNOSIS — Z79899 Other long term (current) drug therapy: Secondary | ICD-10-CM | POA: Diagnosis not present

## 2020-12-25 DIAGNOSIS — D509 Iron deficiency anemia, unspecified: Secondary | ICD-10-CM

## 2020-12-25 DIAGNOSIS — I129 Hypertensive chronic kidney disease with stage 1 through stage 4 chronic kidney disease, or unspecified chronic kidney disease: Secondary | ICD-10-CM | POA: Diagnosis not present

## 2020-12-25 MED ORDER — SODIUM CHLORIDE 0.9 % IV SOLN: 200.0000 mg | Freq: Once | INTRAVENOUS | Status: DC

## 2020-12-25 MED ORDER — IRON SUCROSE 20 MG/ML IV SOLN
200.0000 mg | Freq: Once | INTRAVENOUS | Status: AC
  Administered 2020-12-25: 200 mg via INTRAVENOUS
  Filled 2020-12-25: qty 10

## 2020-12-25 MED ORDER — SODIUM CHLORIDE 0.9 % IV SOLN
Freq: Once | INTRAVENOUS | Status: AC
  Filled 2020-12-25: qty 250

## 2020-12-25 NOTE — Progress Notes (Addendum)
Pt received IV venofer in clinic today. VSS @ d/c. 

## 2020-12-29 ENCOUNTER — Other Ambulatory Visit: Payer: Self-pay

## 2020-12-29 ENCOUNTER — Inpatient Hospital Stay: Payer: Medicare HMO

## 2020-12-29 DIAGNOSIS — D509 Iron deficiency anemia, unspecified: Secondary | ICD-10-CM | POA: Diagnosis not present

## 2020-12-29 DIAGNOSIS — E538 Deficiency of other specified B group vitamins: Secondary | ICD-10-CM

## 2020-12-29 DIAGNOSIS — M069 Rheumatoid arthritis, unspecified: Secondary | ICD-10-CM | POA: Diagnosis not present

## 2020-12-29 DIAGNOSIS — Z79899 Other long term (current) drug therapy: Secondary | ICD-10-CM | POA: Diagnosis not present

## 2020-12-29 DIAGNOSIS — I129 Hypertensive chronic kidney disease with stage 1 through stage 4 chronic kidney disease, or unspecified chronic kidney disease: Secondary | ICD-10-CM | POA: Diagnosis not present

## 2020-12-29 DIAGNOSIS — N183 Chronic kidney disease, stage 3 unspecified: Secondary | ICD-10-CM | POA: Diagnosis not present

## 2020-12-29 LAB — CBC
HCT: 32.3 % — ABNORMAL LOW (ref 36.0–46.0)
Hemoglobin: 10.1 g/dL — ABNORMAL LOW (ref 12.0–15.0)
MCH: 28.7 pg (ref 26.0–34.0)
MCHC: 31.3 g/dL (ref 30.0–36.0)
MCV: 91.8 fL (ref 80.0–100.0)
Platelets: 338 10*3/uL (ref 150–400)
RBC: 3.52 MIL/uL — ABNORMAL LOW (ref 3.87–5.11)
RDW: 15.7 % — ABNORMAL HIGH (ref 11.5–15.5)
WBC: 9.8 10*3/uL (ref 4.0–10.5)
nRBC: 0 % (ref 0.0–0.2)

## 2020-12-29 LAB — FERRITIN: Ferritin: 234 ng/mL (ref 11–307)

## 2020-12-29 LAB — IRON AND TIBC
Iron: 72 ug/dL (ref 28–170)
Saturation Ratios: 25 % (ref 10.4–31.8)
TIBC: 284 ug/dL (ref 250–450)
UIBC: 212 ug/dL

## 2020-12-29 LAB — FOLATE: Folate: >100 ng/mL (ref >5.9)

## 2020-12-31 ENCOUNTER — Other Ambulatory Visit: Payer: Self-pay

## 2020-12-31 ENCOUNTER — Telehealth: Payer: Self-pay | Admitting: Hematology and Oncology

## 2020-12-31 ENCOUNTER — Telehealth: Payer: Self-pay

## 2020-12-31 DIAGNOSIS — D509 Iron deficiency anemia, unspecified: Secondary | ICD-10-CM

## 2020-12-31 NOTE — Telephone Encounter (Signed)
-----   Message from Lequita Asal, MD sent at 12/30/2020  4:17 PM EST ----- Regarding: RE: Please call patient  Hold folic acid x 3 weeks then recheck folate level.  M  ----- Message ----- From: Drue Dun, RN Sent: 12/30/2020  12:09 PM EST To: Lequita Asal, MD, Jorene Minors, RN, # Subject: RE: Please call patient                        M,W,F - 1mg  since the last week of december ----- Message ----- From: Lequita Asal, MD Sent: 12/30/2020   9:02 AM EST To: Jorene Minors, RN, Tyrian Peart Eusebio Me, RN, # Subject: Please call patient                             Folate is high.  How much folic acid is she taking?  M  ----- Message ----- From: Buel Ream, Lab In [place] Sent: 12/29/2020   1:38 PM EST To: Lequita Asal, MD

## 2020-12-31 NOTE — Telephone Encounter (Signed)
Spoke to patient about upcoming appointment scheduled 3 weeks out on 01/21/21

## 2020-12-31 NOTE — Telephone Encounter (Signed)
Patient aware. Scheduler to call and make lab appt

## 2020-12-31 NOTE — Telephone Encounter (Signed)
Left message for patient to call back  

## 2020-12-31 NOTE — Telephone Encounter (Signed)
-----   Message from Lequita Asal, MD sent at 12/30/2020  4:17 PM EST ----- Regarding: RE: Please call patient  Hold folic acid x 3 weeks then recheck folate level.  M  ----- Message ----- From: Drue Dun, RN Sent: 12/30/2020  12:09 PM EST To: Lequita Asal, MD, Jorene Minors, RN, # Subject: RE: Please call patient                        M,W,F - 1mg  since the last week of december ----- Message ----- From: Lequita Asal, MD Sent: 12/30/2020   9:02 AM EST To: Jorene Minors, RN, Caydan Mctavish Eusebio Me, RN, # Subject: Please call patient                             Folate is high.  How much folic acid is she taking?  M  ----- Message ----- From: Buel Ream, Lab In [place] Sent: 12/29/2020   1:38 PM EST To: Lequita Asal, MD

## 2021-01-13 ENCOUNTER — Other Ambulatory Visit: Payer: Medicare HMO

## 2021-01-19 ENCOUNTER — Other Ambulatory Visit: Payer: Self-pay

## 2021-01-21 ENCOUNTER — Inpatient Hospital Stay: Payer: Medicare HMO | Attending: Hematology and Oncology

## 2021-01-21 ENCOUNTER — Other Ambulatory Visit: Payer: Self-pay

## 2021-01-21 DIAGNOSIS — D509 Iron deficiency anemia, unspecified: Secondary | ICD-10-CM | POA: Insufficient documentation

## 2021-01-21 LAB — FOLATE: Folate: 32 ng/mL (ref >5.9)

## 2021-02-03 DIAGNOSIS — D500 Iron deficiency anemia secondary to blood loss (chronic): Secondary | ICD-10-CM | POA: Diagnosis not present

## 2021-02-16 ENCOUNTER — Other Ambulatory Visit: Payer: Medicare HMO

## 2021-02-17 ENCOUNTER — Ambulatory Visit: Payer: Medicare HMO | Admitting: Hematology and Oncology

## 2021-02-17 ENCOUNTER — Ambulatory Visit: Payer: Medicare HMO

## 2021-02-23 ENCOUNTER — Other Ambulatory Visit: Payer: Self-pay

## 2021-02-23 ENCOUNTER — Inpatient Hospital Stay: Payer: Medicare HMO | Attending: Hematology and Oncology

## 2021-02-23 DIAGNOSIS — Z79899 Other long term (current) drug therapy: Secondary | ICD-10-CM | POA: Insufficient documentation

## 2021-02-23 DIAGNOSIS — M7989 Other specified soft tissue disorders: Secondary | ICD-10-CM | POA: Insufficient documentation

## 2021-02-23 DIAGNOSIS — Z833 Family history of diabetes mellitus: Secondary | ICD-10-CM | POA: Insufficient documentation

## 2021-02-23 DIAGNOSIS — Z886 Allergy status to analgesic agent status: Secondary | ICD-10-CM | POA: Insufficient documentation

## 2021-02-23 DIAGNOSIS — R0602 Shortness of breath: Secondary | ICD-10-CM | POA: Insufficient documentation

## 2021-02-23 DIAGNOSIS — Z820 Family history of epilepsy and other diseases of the nervous system: Secondary | ICD-10-CM | POA: Insufficient documentation

## 2021-02-23 DIAGNOSIS — M069 Rheumatoid arthritis, unspecified: Secondary | ICD-10-CM | POA: Diagnosis not present

## 2021-02-23 DIAGNOSIS — D509 Iron deficiency anemia, unspecified: Secondary | ICD-10-CM | POA: Insufficient documentation

## 2021-02-23 DIAGNOSIS — M2550 Pain in unspecified joint: Secondary | ICD-10-CM | POA: Diagnosis not present

## 2021-02-23 DIAGNOSIS — E538 Deficiency of other specified B group vitamins: Secondary | ICD-10-CM | POA: Insufficient documentation

## 2021-02-23 DIAGNOSIS — Z8249 Family history of ischemic heart disease and other diseases of the circulatory system: Secondary | ICD-10-CM | POA: Insufficient documentation

## 2021-02-23 DIAGNOSIS — M79604 Pain in right leg: Secondary | ICD-10-CM | POA: Insufficient documentation

## 2021-02-23 DIAGNOSIS — Z809 Family history of malignant neoplasm, unspecified: Secondary | ICD-10-CM | POA: Insufficient documentation

## 2021-02-23 DIAGNOSIS — D122 Benign neoplasm of ascending colon: Secondary | ICD-10-CM | POA: Diagnosis not present

## 2021-02-23 DIAGNOSIS — Z8269 Family history of other diseases of the musculoskeletal system and connective tissue: Secondary | ICD-10-CM | POA: Insufficient documentation

## 2021-02-23 DIAGNOSIS — N183 Chronic kidney disease, stage 3 unspecified: Secondary | ICD-10-CM | POA: Insufficient documentation

## 2021-02-23 DIAGNOSIS — Z9049 Acquired absence of other specified parts of digestive tract: Secondary | ICD-10-CM | POA: Insufficient documentation

## 2021-02-23 DIAGNOSIS — Z888 Allergy status to other drugs, medicaments and biological substances status: Secondary | ICD-10-CM | POA: Insufficient documentation

## 2021-02-23 DIAGNOSIS — I129 Hypertensive chronic kidney disease with stage 1 through stage 4 chronic kidney disease, or unspecified chronic kidney disease: Secondary | ICD-10-CM | POA: Diagnosis not present

## 2021-02-23 LAB — CBC
HCT: 31.3 % — ABNORMAL LOW (ref 36.0–46.0)
Hemoglobin: 9.9 g/dL — ABNORMAL LOW (ref 12.0–15.0)
MCH: 28.4 pg (ref 26.0–34.0)
MCHC: 31.6 g/dL (ref 30.0–36.0)
MCV: 89.9 fL (ref 80.0–100.0)
Platelets: 257 10*3/uL (ref 150–400)
RBC: 3.48 MIL/uL — ABNORMAL LOW (ref 3.87–5.11)
RDW: 16.7 % — ABNORMAL HIGH (ref 11.5–15.5)
WBC: 10.3 10*3/uL (ref 4.0–10.5)
nRBC: 0 % (ref 0.0–0.2)

## 2021-02-23 LAB — SEDIMENTATION RATE: Sed Rate: 40 mm/hr — ABNORMAL HIGH (ref 0–30)

## 2021-02-23 LAB — FERRITIN: Ferritin: 117 ng/mL (ref 11–307)

## 2021-02-23 LAB — FOLATE: Folate: >100 ng/mL (ref >5.9)

## 2021-02-23 NOTE — Progress Notes (Signed)
[facility]  [address], [address] [place], [place] [address] Phone: [phone]  Fax: [phone]   Clinic Day:  02/24/2021  Referring physician: [name], *  Chief Complaint: Tonya Cummings is a 70 y.o. female with rheumatoid arthritis and iron deficiency anemia who is seen for 3 month assessment.  HPI: The patient was last seen in the hematology clinic on 11/17/2020. At that time, she felt "good." She had right leg pain.  She had been eating a little bit less than usual; weight was down 6 pounds. She denied nose bleeds, gum bleeds, hematuria, and blood in the stool. Hematocrit was 28.6, hemoglobin 9.1, MCV 93.2, platelets 319,000, WBC 9,900. Ferritin was 65 with an iron saturation of 11% and a TIBC of 301. Sed rate was 66. Vitamin B12 was 980 and folate >100.0. She was instructed to stop folic acid for 2-3 weeks then begin taking folic acid 3 days a week. She continued vitamin B12 500 mcg a day.  She received Venofer on 12/18/2020 and 12/25/2020.  Labs on 12/29/2020 revealed a hematocrit of 32.3, hemoglobin 10.1, MCV 91.8, platelets 338,000, WBC 9,800. Ferritin was 234 with an iron saturation of 25% and a TIBC of 284. Folate was >100.0. Folate was 32.0 on 01/21/2021.  During the interim, she has been okay. Her knee is getting better but is not 100% back to normal. She has been using a stationary bike for exercise, which has helped strengthen her legs. She has had occasional mild nosebleeds all her life. She denies any other bleeding. Her feet have been swollen since surgery.  She takes vitamin B12 500 mcg daily. She held her folic acid for 3 weeks after her last visit and then restarted it once a day.  She had a capsule study in January 2022 which was "normal" per patient.  She does not have a nephrologist. Her rheumatologist is Dr. Posey Pronto. She takes Humira, methotrexate, and plaquenil for her rheumatoid arthritis.   Past Medical History:   Diagnosis Date  . Anxiety   . Chronic kidney disease   . Chronic pain   . Chronic radicular pain of lower back   . Costochondritis   . Depression   . Encounter for blood transfusion   . GERD (gastroesophageal reflux disease)   . Headache    migraines  . Hip dysplasia   . Hyperlipidemia   . Hypertension   . Iron deficiency anemia   . Low back pain   . Low vitamin B12 level   . Obesity   . Osteoporosis   . Pelvic fracture ([place])   . Raynaud's disease without gangrene   . Restless leg syndrome   . Rheumatoid aortitis   . Rheumatoid arthritis (H[facility])    polyarthritis  . Rheumatoid arthritis ([place])   . Seasonal allergies   . Sleep disorder   . Thoracic compression fracture [facility])     Past Surgical History:  Procedure Laterality Date  . ABDOMINAL SURGERY     pt denies  . APPENDECTOMY    . CARPAL TUNNEL RELEASE Bilateral   . CHOLECYSTECTOMY    . COLONOSCOPY WITH PROPOFOL N/A 08/15/2020   Procedure: COLONOSCOPY WITH PROPOFOL;  Surgeon: Robert Bellow, MD;  Location: [facility] ENDOSCOPY;  Service: Endoscopy;  Laterality: N/A;  . DILATION AND CURETTAGE OF UTERUS    . ESOPHAGOGASTRODUODENOSCOPY (EGD) WITH PROPOFOL N/A 08/15/2020   Procedure: ESOPHAGOGASTRODUODENOSCOPY (EGD) WITH PROPOFOL;  Surgeon: Robert Bellow, MD;  Location: [facility] ENDOSCOPY;  Service: Endoscopy;  Laterality: N/A;  . FRACTURE SURGERY     hip fracture   . FRACTURE SURGERY     pelvic fracture plate   . HIP SURGERY Left   . KNEE ARTHROPLASTY Right 10/13/2020   Procedure: COMPUTER ASSISTED TOTAL KNEE ARTHROPLASTY - RNFA;  Surgeon: Dereck Leep, MD;  Location: [facility] ORS;  Service: Orthopedics;  Laterality: Right;  . TUBAL LIGATION      Family History  Problem Relation Age of Onset  . Diabetes Mother   . Hypertension Mother   . Aneurysm Father   . Diabetes Son   . Seizures Son   . Osteosarcoma Brother   . Cancer Brother   . Diabetes Brother   . Diabetes Maternal Grandfather   . Heart disease Maternal  Grandfather     Social History:  reports that she has never smoked. She has never used smokeless tobacco. She reports that she does not drink alcohol and does not use drugs. She has no known exposure to chemicals or radiation.Her husband's name is Herbie Baltimore. They have 3 children. She has been disabled since 2015. She previously worked in [employer]. She lives in [place] her husband, son, and eldest daughter. The patient is alone today.   Allergies:  Allergies  Allergen Reactions  . Gabapentin Other (See Comments)    Weight gain  . Ibuprofen Other (See Comments)    Headache    Current Medications: Current Outpatient Medications  Medication Sig Dispense Refill  . Acetaminophen-Caffeine (EXCEDRIN TENSION HEADACHE) 500-65 MG TABS Take 1 tablet by mouth daily as needed (Headache).    . Adalimumab 40 MG/0.4ML PSKT Inject 40 mg into the skin every 14 (fourteen) days.     . ASCORBIC ACID PO Take 1 tablet by mouth daily.     . Calcium Carbonate-Vitamin D 600-400 MG-UNIT chew tablet Chew 1 tablet by mouth 2 (two) times daily.     . Cholecalciferol 50 MCG (2000 UT) CAPS Take 2,000 Units by mouth daily.    . cyclobenzaprine (FLEXERIL) 10 MG tablet Take 10 mg by mouth at bedtime.    . enalapril-hydrochlorothiazide (VASERETIC) 10-25 MG per tablet Take 1 tablet by mouth daily.    . ferrous sulfate 325 (65 FE) MG tablet Take 325 mg by mouth daily with breakfast.    . FLUoxetine (PROZAC) 40 MG capsule Take 40 mg by mouth daily.    . folic acid (FOLVITE) 1 MG tablet Take 1 mg by mouth daily.    . hydroxychloroquine (PLAQUENIL) 200 MG tablet Take 200 mg by mouth 2 (two) times daily.    . methotrexate (50 MG/ML) 1 g injection Inject 25 mg into the vein once a week. .6 ml    . Multiple Vitamins-Minerals (MULTIVITAMIN ADULT PO) Take 1 tablet by mouth daily.     Marland Kitchen omeprazole (PRILOSEC) 40 MG capsule Take 40 mg by mouth 2 (two) times daily.     . pramipexole (MIRAPEX) 0.125 MG tablet Take 0.125  mg by mouth daily. Take 0.125 mg in the morning and 2 at bedtime    . rosuvastatin (CRESTOR) 5 MG tablet Take 5 mg by mouth every other day.     . traZODone (DESYREL) 50 MG tablet Take 50 mg by mouth at bedtime.     . vitamin B-12 (CYANOCOBALAMIN) 500 MCG tablet Take 500 mcg by mouth daily.     No current facility-administered medications for this visit.    Review of Systems  Constitutional: Negative for chills, diaphoresis, fever, malaise/fatigue and weight loss (  up 3 lbs).  HENT: Positive for nosebleeds (occasionlly). Negative for congestion, ear discharge, ear pain, hearing loss, sinus pain, sore throat and tinnitus.   Eyes: Negative for blurred vision.  Respiratory: Positive for shortness of breath (with exertion). Negative for cough, hemoptysis and sputum production.   Cardiovascular: Positive for leg swelling (feet). Negative for chest pain, palpitations and orthopnea.  Gastrointestinal: Negative for abdominal pain, blood in stool, constipation, diarrhea, heartburn (on omeprazole), melena, nausea and vomiting.  Genitourinary: Negative.  Negative for dysuria, flank pain, frequency, hematuria and urgency.  Musculoskeletal: Positive for joint pain (right knee). Negative for back pain, falls, myalgias and neck pain.  Skin: Negative.  Negative for itching and rash.  Neurological: Negative for dizziness, tingling, tremors, sensory change, speech change, focal weakness, seizures, weakness and headaches.  Endo/Heme/Allergies: Negative.  Does not bruise/bleed easily.  Psychiatric/Behavioral: Negative for depression and memory loss. The patient is not nervous/anxious and does not have insomnia.   All other systems reviewed and are negative.  Performance status (ECOG):  1  Vitals Blood pressure (!) 146/65, pulse 82, temperature (!) 97.1 F (36.2 C), temperature source Tympanic, resp. rate 18, weight 224 lb 13.9 oz (102 kg), SpO2 100 %.  Physical Exam Vitals and nursing note reviewed.   Constitutional:      General: She is not in acute distress.    Appearance: She is well-developed. She is not diaphoretic.     Interventions: Face mask in place.     Comments: Patient required assistance onto exam table. She has a cane by her side.  HENT:     Head: Normocephalic and atraumatic.     Comments: Long brown hair.    Mouth/Throat:     Mouth: Mucous membranes are moist.     Pharynx: Oropharynx is clear. No oropharyngeal exudate.  Eyes:     General: No scleral icterus.    Extraocular Movements: Extraocular movements intact.     Conjunctiva/sclera: Conjunctivae normal.     Pupils: Pupils are equal, round, and reactive to light.     Comments: Glasses.  Blue eyes.  Neck:     Vascular: No JVD.  Cardiovascular:     Rate and Rhythm: Normal rate and regular rhythm.     Heart sounds: Normal heart sounds. No murmur heard. No gallop.   Pulmonary:     Effort: Pulmonary effort is normal. No respiratory distress.     Breath sounds: Normal breath sounds. No wheezing or rales.  Chest:     Chest wall: No tenderness.  Breasts:     Right: No supraclavicular adenopathy.     Left: No supraclavicular adenopathy.    Abdominal:     General: Bowel sounds are normal. There is no distension.     Palpations: Abdomen is soft. There is no mass.     Tenderness: There is no abdominal tenderness. There is no guarding or rebound.  Musculoskeletal:        General: No swelling or tenderness. Normal range of motion.     Cervical back: Normal range of motion and neck supple.     Right lower leg: No edema.     Left lower leg: No edema.  Lymphadenopathy:     Head:     Right side of head: No preauricular, posterior auricular or occipital adenopathy.     Left side of head: No preauricular, posterior auricular or occipital adenopathy.     Cervical: No cervical adenopathy.     Upper Body:     Right  upper body: No supraclavicular adenopathy.     Left upper body: No supraclavicular adenopathy.      Lower Body: No right inguinal adenopathy. No left inguinal adenopathy.  Skin:    General: Skin is warm and dry.     Coloration: Skin is not pale.     Findings: No erythema or rash.  Neurological:     Mental Status: She is alert and oriented to person, place, and time.  Psychiatric:        Behavior: Behavior normal.        Thought Content: Thought content normal.        Judgment: Judgment normal.    Appointment on 02/23/2021  Component Date Value Ref Range Status  . Folate 02/23/2021 >100.0  >5.9 ng/mL Final   Comment: RESULT CONFIRMED BY MANUAL DILUTION HNM Performed at [facility], [address] [place], [place] [address]   . Sed Rate 02/23/2021 40* 0 - 30 mm/hr Final   Performed at [facility], [address] [place], [place] [address]  . Ferritin 02/23/2021 117  11 - 307 ng/mL Final   Performed at [facility], [address] [place], [place] [address]  . WBC 02/23/2021 10.3  4.0 - 10.5 K/uL Final  . RBC 02/23/2021 3.48* 3.87 - 5.11 MIL/uL Final  . Hemoglobin 02/23/2021 9.9* 12.0 - 15.0 g/dL Final  . HCT 02/23/2021 31.3* 36.0 - 46.0 % Final  . MCV 02/23/2021 89.9  80.0 - 100.0 fL Final  . MCH 02/23/2021 28.4  26.0 - 34.0 pg Final  . MCHC 02/23/2021 31.6  30.0 - 36.0 g/dL Final  . RDW 02/23/2021 16.7* 11.5 - 15.5 % Final  . Platelets 02/23/2021 257  150 - 400 K/uL Final  . nRBC 02/23/2021 0.0  0.0 - 0.2 % Final   Performed at [facility], [address], [place],  [address]    Assessment:  EULALIE SPEIGHTS is a 70 y.o. female withseropositive rheumatoid arthritisand a progressive normocytic anemia. She was initially diagnosed in 2000. She is currently on methotrexate SQ oncea week, Plaquenil 200 mg twice daily and Humira 40 mgeveryother week (since 05/2016). She previously received Morrie Sheldon (2014/2015-02/2016) and Enbrel 25 mg biweekly. Shetakes folic acid1 mg a day.   Work-up on 09/07/2019 revealed a  hematocrit of 26.2, hemoglobin 8.0, MCV 82.4, platelets 351,000, white count 7900 with an ANC of 5000.  Ferritin was 10 with an iron saturation of 6% and a TIBC of 428.  CRP and sed rate were levated.  B12 was 339 (low normal).  Normal studies included:  folate (> 100), TSH, and haptoglobin.  LDH was 209.  Retic was 1.9%.  She has iron deficiency.  She received Venofer weekly x 3 (10/03/2019 - 10/16/2019), x 2 (05/02/2020 - 05/07/2020), 07/01/2020, and x 2 (12/18/2020 - 12/25/2020).  Ferritin has been followed:  10 on 09/07/2019, 16 on 09/26/2019, 19 on 10/03/2019, 191 on 10/23/2019, 40 on 12/24/2019, 14 on 02/26/2020, 104 on 06/30/2020, 124 and 07/22/2020, 70 on 10/01/2020, 65 on 11/17/2020, 234 on 12/29/2020 and 117 on 02/23/2021.  Colonoscopy on 08/15/2020 revealed one 5 mm polyp in the proximal ascending colon (tubular adenoma). There was diverticulosis in the sigmoid colon. EGD was normal.  Capsule study in 12/2020 was normal per patient (no report available).  She has B12 deficiency.  B12 was 339 on 09/07/2019.  She is on oral B12 500 mcg a day.  B12 was 604 on 10/23/2019.  Folate was >  100 on 02/23/2021.  She has stage III chronic kidney disease.Creatinine is 1.3 (CrCl 41 ml/min).  She underwent computer assisted total right knee replacement on 10/13/2020  Symptomatically, she feels "ok".  She has had occasional mild nosebleeds all her life. She denies any other bleeding.   Plan: 1.   Review labs from 02/23/2021. 2. Iron deficiency anemia           Symptomatically, she feels "ok".  She denies any bleeding.    Hematocrit 31.3.  Hemoglobin 9.9.  MCV 89.9 on 02/23/2021.   Ferritin 117.  Sed rate 40.  Ferritin goal 100.  She last received IV iron on 12/25/2020.  Colonoscopy and EGD from 08/15/2020 revealed no evidence of bleeding.  Capsule study in 12/2020 was normal per patient (no report available).  She has some component of anemia of chronic disease. 3.   B12 deficiency              B12 was 339 on 09/07/2019 and 980 on 11/17/2020             B12 goal is 400.             She is on B12 500 mcg a day.             Folate > 100.  Check B12 and folate annually. 4.   No Venofer today. 5.   RTC in 2 months for labs (CBC, ferritin, iron studies, soluble transferrin receptor). 6.   RTC in 4 months for MD assessment, labs (CBC with diff, Cr, ferritin, iron studies) and +/- Venofer.  I discussed the assessment and treatment plan with the patient.  The patient was provided an opportunity to ask questions and all were answered.  The patient agreed with the plan and demonstrated an understanding of the instructions.  The patient was advised to call back if the symptoms worsen or if the condition fails to improve as anticipated.   Lequita Asal, MD, PhD    02/24/2021, 1:33 PM  I, Mirian Mo Tufford, am acting as a Education administrator for [employer]. Mike Gip, MD.   I, Zamyiah Tino C. Mike Gip, MD, have reviewed the above documentation for accuracy and completeness, and I agree with the above.

## 2021-02-24 ENCOUNTER — Other Ambulatory Visit: Payer: Self-pay | Admitting: Hematology and Oncology

## 2021-02-24 ENCOUNTER — Encounter: Payer: Self-pay | Admitting: Hematology and Oncology

## 2021-02-24 ENCOUNTER — Inpatient Hospital Stay (HOSPITAL_BASED_OUTPATIENT_CLINIC_OR_DEPARTMENT_OTHER): Payer: Medicare HMO

## 2021-02-24 ENCOUNTER — Inpatient Hospital Stay (HOSPITAL_BASED_OUTPATIENT_CLINIC_OR_DEPARTMENT_OTHER): Payer: Medicare HMO | Admitting: Hematology and Oncology

## 2021-02-24 VITALS — BP 146/65 | HR 82 | Temp 97.1°F | Resp 18 | Wt 224.9 lb

## 2021-02-24 DIAGNOSIS — D509 Iron deficiency anemia, unspecified: Secondary | ICD-10-CM | POA: Diagnosis not present

## 2021-02-24 DIAGNOSIS — M79604 Pain in right leg: Secondary | ICD-10-CM | POA: Diagnosis not present

## 2021-02-24 DIAGNOSIS — I129 Hypertensive chronic kidney disease with stage 1 through stage 4 chronic kidney disease, or unspecified chronic kidney disease: Secondary | ICD-10-CM | POA: Diagnosis not present

## 2021-02-24 DIAGNOSIS — M069 Rheumatoid arthritis, unspecified: Secondary | ICD-10-CM | POA: Diagnosis not present

## 2021-02-24 DIAGNOSIS — E538 Deficiency of other specified B group vitamins: Secondary | ICD-10-CM

## 2021-02-24 DIAGNOSIS — M2550 Pain in unspecified joint: Secondary | ICD-10-CM | POA: Diagnosis not present

## 2021-02-24 DIAGNOSIS — N183 Chronic kidney disease, stage 3 unspecified: Secondary | ICD-10-CM | POA: Diagnosis not present

## 2021-02-24 DIAGNOSIS — M7989 Other specified soft tissue disorders: Secondary | ICD-10-CM | POA: Diagnosis not present

## 2021-02-24 DIAGNOSIS — R0602 Shortness of breath: Secondary | ICD-10-CM | POA: Diagnosis not present

## 2021-02-24 NOTE — Patient Instructions (Signed)
  Continue folic acid.

## 2021-02-24 NOTE — Progress Notes (Signed)
Patient is taking folic acid every day

## 2021-03-02 DIAGNOSIS — M059 Rheumatoid arthritis with rheumatoid factor, unspecified: Secondary | ICD-10-CM | POA: Diagnosis not present

## 2021-03-02 DIAGNOSIS — Z79899 Other long term (current) drug therapy: Secondary | ICD-10-CM | POA: Diagnosis not present

## 2021-03-02 DIAGNOSIS — N1832 Chronic kidney disease, stage 3b: Secondary | ICD-10-CM | POA: Diagnosis not present

## 2021-03-11 ENCOUNTER — Ambulatory Visit
Admission: EM | Admit: 2021-03-11 | Discharge: 2021-03-11 | Disposition: A | Discharge status: Home / Self Care | Payer: Medicare HMO | Attending: Emergency Medicine | Admitting: Emergency Medicine

## 2021-03-11 ENCOUNTER — Other Ambulatory Visit: Payer: Self-pay

## 2021-03-11 DIAGNOSIS — J340 Abscess, furuncle and carbuncle of nose: Secondary | ICD-10-CM | POA: Diagnosis not present

## 2021-03-11 MED ORDER — DOXYCYCLINE HYCLATE 100 MG PO CAPS: 100.0000 mg | ORAL_CAPSULE | Freq: Two times a day (BID) | ORAL | 0 refills | Status: DC

## 2021-03-11 NOTE — ED Provider Notes (Signed)
[facility]    CSN: [id number] Arrival date & time: 03/11/21  6222      History   Chief Complaint Chief Complaint  Patient presents with  . Nose Problem    HPI Tonya Cummings is a 69 y.o. female.   HPI   70 year old female here for evaluation of a painful nose.  Patient reports that she has had redness and swelling to the tip of her nose for the last 4 days.  She reports that it started become painful yesterday.  Patient also reports that she had a nosebleed yesterday.  Patient denies any fever.  Past Medical History:  Diagnosis Date  . Anxiety   . Chronic kidney disease   . Chronic pain   . Chronic radicular pain of lower back   . Costochondritis   . Depression   . Encounter for blood transfusion   . GERD (gastroesophageal reflux disease)   . Headache    migraines  . Hip dysplasia   . Hyperlipidemia   . Hypertension   . Iron deficiency anemia   . Low back pain   . Low vitamin B12 level   . Obesity   . Osteoporosis   . Pelvic fracture ([place])   . Raynaud's disease without gangrene   . Restless leg syndrome   . Rheumatoid aortitis   . Rheumatoid arthritis (HCC)    polyarthritis  . Rheumatoid arthritis ([place])   . Seasonal allergies   . Sleep disorder   . Thoracic compression fracture [facility])     Patient Active Problem List   Diagnosis Date Noted  . Total knee replacement status 10/13/2020  . CKD (chronic kidney disease) stage 3, GFR 30-59 ml/min (HCC) 10/12/2020  . Hyperlipidemia 10/12/2020  . Hypertension 10/12/2020  . Migraine headache 10/12/2020  . Restless legs syndrome (RLS) 10/12/2020  . Severe obesity (BMI 35.0-39.9) with comorbidity ([place]) 06/30/2020  . Primary osteoarthritis of right knee 03/27/2020  . Iron deficiency anemia 09/12/2019  . Low vitamin B12 level 09/12/2019  . Normocytic anemia 09/07/2019  . Right medial knee pain 08/09/2018  . Depression with anxiety 03/22/2018  . Sleep disorder 03/22/2018  . Influenza A  02/17/2018  . Community acquired pneumonia 02/17/2018  . Chronic radicular pain of lower back 02/06/2018  . Numbness and tingling of right leg 02/06/2018  . Hip dysplasia 02/23/2016  . Osteoporosis, post-menopausal 02/23/2016  . Raynaud's disease without gangrene 02/23/2016  . Seropositive rheumatoid arthritis ([place]) 02/23/2016  . Lumbar facet joint pain 09/18/2014    Past Surgical History:  Procedure Laterality Date  . ABDOMINAL SURGERY     pt denies  . APPENDECTOMY    . CARPAL TUNNEL RELEASE Bilateral   . CHOLECYSTECTOMY    . COLONOSCOPY WITH PROPOFOL N/A 08/15/2020   Procedure: COLONOSCOPY WITH PROPOFOL;  Surgeon: Robert Bellow, MD;  Location: [facility];  Service: Endoscopy;  Laterality: N/A;  . DILATION AND CURETTAGE OF UTERUS    . ESOPHAGOGASTRODUODENOSCOPY (EGD) WITH PROPOFOL N/A 08/15/2020   Procedure: ESOPHAGOGASTRODUODENOSCOPY (EGD) WITH PROPOFOL;  Surgeon: Robert Bellow, MD;  Location: [facility];  Service: Endoscopy;  Laterality: N/A;  . FRACTURE SURGERY     hip fracture   . FRACTURE SURGERY     pelvic fracture plate   . HIP SURGERY Left   . KNEE ARTHROPLASTY Right 10/13/2020   Procedure: COMPUTER ASSISTED TOTAL KNEE ARTHROPLASTY - RNFA;  Surgeon: Dereck Leep, MD;  Location: [facility] ORS;  Service: Orthopedics;  Laterality: Right;  . TUBAL LIGATION  OB History   No obstetric history on file.      Home Medications    Prior to Admission medications   Medication Sig Start Date End Date Taking? Authorizing Provider  Acetaminophen-Caffeine (EXCEDRIN TENSION HEADACHE) 500-65 MG TABS Take 1 tablet by mouth daily as needed (Headache).   Yes [provider]  Adalimumab 40 MG/0.4ML PSKT Inject 40 mg into the skin every 14 (fourteen) days.    Yes [provider]  ASCORBIC ACID PO Take 1 tablet by mouth daily.    Yes [provider]  Calcium Carbonate-Vitamin D 600-400 MG-UNIT chew tablet Chew 1 tablet by mouth 2 (two) times  daily.  01/13/10  Yes [provider]  Cholecalciferol 50 MCG (2000 UT) CAPS Take 2,000 Units by mouth daily. 09/23/09  Yes [provider]  cyclobenzaprine (FLEXERIL) 10 MG tablet Take 10 mg by mouth at bedtime.   Yes [provider]  doxycycline (VIBRAMYCIN) 100 MG capsule Take 1 capsule (100 mg total) by mouth 2 (two) times daily. 03/11/21  Yes Margarette Canada, NP  enalapril-hydrochlorothiazide (VASERETIC) 10-25 MG per tablet Take 1 tablet by mouth daily.   Yes [provider]  ferrous sulfate 325 (65 FE) MG tablet Take 325 mg by mouth daily with breakfast.   Yes [provider]  FLUoxetine (PROZAC) 40 MG capsule Take 40 mg by mouth daily.   Yes [provider]  folic acid (FOLVITE) 1 MG tablet Take 1 mg by mouth daily.   Yes [provider]  hydroxychloroquine (PLAQUENIL) 200 MG tablet Take 200 mg by mouth 2 (two) times daily.   Yes [provider]  methotrexate (50 MG/ML) 1 g injection Inject 25 mg into the vein once a week. .6 ml   Yes [provider]  Multiple Vitamins-Minerals (MULTIVITAMIN ADULT PO) Take 1 tablet by mouth daily.  09/10/08  Yes [provider]  omeprazole (PRILOSEC) 40 MG capsule Take 40 mg by mouth 2 (two) times daily.  01/11/20  Yes [provider]  pramipexole (MIRAPEX) 0.125 MG tablet Take 0.125 mg by mouth daily. Take 0.125 mg in the morning and 2 at bedtime 02/15/18  Yes [provider]  rosuvastatin (CRESTOR) 5 MG tablet Take 5 mg by mouth every other day.  07/18/19  Yes [provider]  traZODone (DESYREL) 50 MG tablet Take 50 mg by mouth at bedtime.  02/15/18  Yes [provider]  vitamin B-12 (CYANOCOBALAMIN) 500 MCG tablet Take 500 mcg by mouth daily.   Yes [provider]    Family History Family History  Problem Relation Age of Onset  . Diabetes Mother   . Hypertension Mother   . Aneurysm Father   . Diabetes Son   . Seizures Son   .  Osteosarcoma Brother   . Cancer Brother   . Diabetes Brother   . Diabetes Maternal Grandfather   . Heart disease Maternal Grandfather     Social History Social History   Tobacco Use  . Smoking status: Never Smoker  . Smokeless tobacco: Never Used  Vaping Use  . Vaping Use: Never used  Substance Use Topics  . Alcohol use: No  . Drug use: No     Allergies   Gabapentin and Ibuprofen   Review of Systems Review of Systems  Constitutional: Negative for activity change, appetite change and fever.  HENT: Positive for facial swelling and nosebleeds.   Skin: Positive for color change.  Hematological: Negative.   Psychiatric/Behavioral: Negative.  Physical Exam Triage Vital Signs ED Triage Vitals  Enc Vitals Group     BP 03/11/21 0918 (!) 161/78     Pulse Rate 03/11/21 0918 86     Resp 03/11/21 0918 18     Temp 03/11/21 0918 98.2 F (36.8 C)     Temp Source 03/11/21 0918 Oral     SpO2 03/11/21 0918 100 %     Weight 03/11/21 0916 226 lb (102.5 kg)     Height 03/11/21 0916 5\' 4"  (1.626 m)     Head Circumference --      Peak Flow --      Pain Score 03/11/21 0915 3     Pain Loc --      Pain Edu? --      Excl. in [place]? --    No data found.  Updated Vital Signs BP (!) 161/78 (BP Location: Right Arm)   Pulse 86   Temp 98.2 F (36.8 C) (Oral)   Resp 18   Ht 5\' 4"  (1.626 m)   Wt 226 lb (102.5 kg)   SpO2 100%   BMI 38.79 kg/m   Visual Acuity Right Eye Distance:   Left Eye Distance:   Bilateral Distance:    Right Eye Near:   Left Eye Near:    Bilateral Near:     Physical Exam Vitals and nursing note reviewed.  Constitutional:      General: She is not in acute distress.    Appearance: Normal appearance. She is obese. She is not ill-appearing.  HENT:     Head: Normocephalic and atraumatic.     Right Ear: Tympanic membrane, ear canal and external ear normal.     Left Ear: Tympanic membrane, ear canal and external ear normal.  Skin:    General: Skin is  warm and dry.     Capillary Refill: Capillary refill takes less than 2 seconds.     Findings: Erythema present. No bruising.  Neurological:     General: No focal deficit present.     Mental Status: She is alert and oriented to person, place, and time.  Psychiatric:        Mood and Affect: Mood normal.        Behavior: Behavior normal.        Thought Content: Thought content normal.        Judgment: Judgment normal.      UC Treatments / Results  Labs (all labs ordered are listed, but only abnormal results are displayed) Labs Reviewed - No data to display  EKG   Radiology No results found.  Procedures Procedures (including critical care time)  Medications Ordered in UC Medications - No data to display  Initial Impression / Assessment and Plan / UC Course  I have reviewed the triage vital signs and the nursing notes.  Pertinent labs & imaging results that were available during my care of the patient were reviewed by me and considered in my medical decision making (see chart for details).   Patient is here for evaluation of a red, swollen, painful nose that started 4 days ago.  Patient reports that last night she had a nosebleed but she is unaware of any injury to her nose.  Patient is not had a fever.  Physical exam reveals a markedly erythematous and edematous nose that is warm.  There is no fluctuance or induration.  There is no extension onto the cheeks or into the face.  Frontal and maxillary sinuses are mildly  tender to percussion.  Nasal mucosa is obscured by significant amounts of bloody discharge in both naris.  No wounds or erosions identified on the visible mucosa.  We will treat patient for cellulitis of her nose with doxycycline twice daily to cover both MSSA and MRSA.   Final Clinical Impressions(s) / UC Diagnoses   Final diagnoses:  Cellulitis of external nose     Discharge Instructions     Take the doxycycline twice daily with food for 10 days for  treatment of your cellulitis.  If you notice that the redness and swelling are spreading to your face, or you begin to have more bloody discharge from your nose, you need to go to the ER for evaluation.    ED Prescriptions    Medication Sig Dispense Auth. Provider   doxycycline (VIBRAMYCIN) 100 MG capsule Take 1 capsule (100 mg total) by mouth 2 (two) times daily. 20 capsule Margarette Canada, NP     PDMP not reviewed this encounter.   Margarette Canada, NP 03/11/21 [phone]

## 2021-03-11 NOTE — ED Triage Notes (Signed)
Patient has redness and swelling to the tip of her nose x Saturday. States that area is painful and tender to the touch. Reports that she is also having headaches from this. States that pin has progressively worsened everyday.

## 2021-03-11 NOTE — Discharge Instructions (Addendum)
Take the doxycycline twice daily with food for 10 days for treatment of your cellulitis.  If you notice that the redness and swelling are spreading to your face, or you begin to have more bloody discharge from your nose, you need to go to the ER for evaluation.

## 2021-04-07 DIAGNOSIS — Z96651 Presence of right artificial knee joint: Secondary | ICD-10-CM | POA: Diagnosis not present

## 2021-04-08 DIAGNOSIS — M069 Rheumatoid arthritis, unspecified: Secondary | ICD-10-CM | POA: Diagnosis not present

## 2021-04-08 DIAGNOSIS — I10 Essential (primary) hypertension: Secondary | ICD-10-CM | POA: Diagnosis not present

## 2021-04-08 DIAGNOSIS — F418 Other specified anxiety disorders: Secondary | ICD-10-CM | POA: Diagnosis not present

## 2021-04-08 DIAGNOSIS — N1832 Chronic kidney disease, stage 3b: Secondary | ICD-10-CM | POA: Diagnosis not present

## 2021-04-08 DIAGNOSIS — G2581 Restless legs syndrome: Secondary | ICD-10-CM | POA: Diagnosis not present

## 2021-04-08 DIAGNOSIS — I129 Hypertensive chronic kidney disease with stage 1 through stage 4 chronic kidney disease, or unspecified chronic kidney disease: Secondary | ICD-10-CM | POA: Diagnosis not present

## 2021-04-08 DIAGNOSIS — E782 Mixed hyperlipidemia: Secondary | ICD-10-CM | POA: Diagnosis not present

## 2021-04-08 DIAGNOSIS — Z79899 Other long term (current) drug therapy: Secondary | ICD-10-CM | POA: Diagnosis not present

## 2021-04-08 DIAGNOSIS — Z6836 Body mass index (BMI) 36.0-36.9, adult: Secondary | ICD-10-CM | POA: Diagnosis not present

## 2021-04-08 DIAGNOSIS — G479 Sleep disorder, unspecified: Secondary | ICD-10-CM | POA: Diagnosis not present

## 2021-04-22 DIAGNOSIS — Z0100 Encounter for examination of eyes and vision without abnormal findings: Secondary | ICD-10-CM | POA: Diagnosis not present

## 2021-04-22 DIAGNOSIS — H52229 Regular astigmatism, unspecified eye: Secondary | ICD-10-CM | POA: Diagnosis not present

## 2021-04-22 DIAGNOSIS — I10 Essential (primary) hypertension: Secondary | ICD-10-CM | POA: Diagnosis not present

## 2021-04-27 ENCOUNTER — Other Ambulatory Visit: Payer: Medicare HMO

## 2021-04-28 ENCOUNTER — Other Ambulatory Visit: Payer: Medicare HMO

## 2021-04-28 DIAGNOSIS — N1832 Chronic kidney disease, stage 3b: Secondary | ICD-10-CM | POA: Diagnosis not present

## 2021-04-28 DIAGNOSIS — I10 Essential (primary) hypertension: Secondary | ICD-10-CM | POA: Diagnosis not present

## 2021-04-29 ENCOUNTER — Other Ambulatory Visit: Payer: Self-pay

## 2021-04-29 DIAGNOSIS — D649 Anemia, unspecified: Secondary | ICD-10-CM

## 2021-04-29 DIAGNOSIS — D509 Iron deficiency anemia, unspecified: Secondary | ICD-10-CM

## 2021-04-29 DIAGNOSIS — E538 Deficiency of other specified B group vitamins: Secondary | ICD-10-CM

## 2021-04-30 ENCOUNTER — Other Ambulatory Visit: Payer: Self-pay

## 2021-04-30 ENCOUNTER — Inpatient Hospital Stay: Payer: Medicare HMO | Attending: Oncology

## 2021-04-30 DIAGNOSIS — D649 Anemia, unspecified: Secondary | ICD-10-CM | POA: Diagnosis not present

## 2021-04-30 DIAGNOSIS — E538 Deficiency of other specified B group vitamins: Secondary | ICD-10-CM

## 2021-04-30 LAB — FERRITIN: Ferritin: 76 ng/mL (ref 11–307)

## 2021-04-30 LAB — CBC
HCT: 32.3 % — ABNORMAL LOW (ref 36.0–46.0)
Hemoglobin: 10.3 g/dL — ABNORMAL LOW (ref 12.0–15.0)
MCH: 29.2 pg (ref 26.0–34.0)
MCHC: 31.9 g/dL (ref 30.0–36.0)
MCV: 91.5 fL (ref 80.0–100.0)
Platelets: 279 10*3/uL (ref 150–400)
RBC: 3.53 MIL/uL — ABNORMAL LOW (ref 3.87–5.11)
RDW: 16.3 % — ABNORMAL HIGH (ref 11.5–15.5)
WBC: 10.7 10*3/uL — ABNORMAL HIGH (ref 4.0–10.5)
nRBC: 0 % (ref 0.0–0.2)

## 2021-04-30 LAB — IRON AND TIBC
Iron: 60 ug/dL (ref 28–170)
Saturation Ratios: 21 % (ref 10.4–31.8)
TIBC: 286 ug/dL (ref 250–450)
UIBC: 226 ug/dL

## 2021-05-01 LAB — SOLUBLE TRANSFERRIN RECEPTOR: Transferrin Receptor: 18.4 nmol/L (ref 12.2–27.3)

## 2021-06-01 DIAGNOSIS — R809 Proteinuria, unspecified: Secondary | ICD-10-CM | POA: Diagnosis not present

## 2021-06-01 DIAGNOSIS — I10 Essential (primary) hypertension: Secondary | ICD-10-CM | POA: Diagnosis not present

## 2021-06-01 DIAGNOSIS — N2581 Secondary hyperparathyroidism of renal origin: Secondary | ICD-10-CM | POA: Diagnosis not present

## 2021-06-01 DIAGNOSIS — D631 Anemia in chronic kidney disease: Secondary | ICD-10-CM | POA: Diagnosis not present

## 2021-06-01 DIAGNOSIS — N1832 Chronic kidney disease, stage 3b: Secondary | ICD-10-CM | POA: Diagnosis not present

## 2021-06-26 DIAGNOSIS — Z79899 Other long term (current) drug therapy: Secondary | ICD-10-CM | POA: Diagnosis not present

## 2021-06-26 DIAGNOSIS — M059 Rheumatoid arthritis with rheumatoid factor, unspecified: Secondary | ICD-10-CM | POA: Diagnosis not present

## 2021-06-26 DIAGNOSIS — N1832 Chronic kidney disease, stage 3b: Secondary | ICD-10-CM | POA: Diagnosis not present

## 2021-06-28 ENCOUNTER — Other Ambulatory Visit: Payer: Self-pay | Admitting: *Deleted

## 2021-06-28 DIAGNOSIS — D509 Iron deficiency anemia, unspecified: Secondary | ICD-10-CM

## 2021-06-29 ENCOUNTER — Other Ambulatory Visit: Payer: Medicare HMO

## 2021-06-30 ENCOUNTER — Ambulatory Visit: Payer: Medicare HMO | Admitting: Oncology

## 2021-06-30 ENCOUNTER — Inpatient Hospital Stay: Payer: Medicare HMO | Attending: Internal Medicine

## 2021-06-30 ENCOUNTER — Ambulatory Visit: Payer: Medicare HMO

## 2021-06-30 DIAGNOSIS — Z833 Family history of diabetes mellitus: Secondary | ICD-10-CM | POA: Diagnosis not present

## 2021-06-30 DIAGNOSIS — M2550 Pain in unspecified joint: Secondary | ICD-10-CM | POA: Insufficient documentation

## 2021-06-30 DIAGNOSIS — N183 Chronic kidney disease, stage 3 unspecified: Secondary | ICD-10-CM | POA: Insufficient documentation

## 2021-06-30 DIAGNOSIS — Z809 Family history of malignant neoplasm, unspecified: Secondary | ICD-10-CM | POA: Diagnosis not present

## 2021-06-30 DIAGNOSIS — E785 Hyperlipidemia, unspecified: Secondary | ICD-10-CM | POA: Diagnosis not present

## 2021-06-30 DIAGNOSIS — M7989 Other specified soft tissue disorders: Secondary | ICD-10-CM | POA: Insufficient documentation

## 2021-06-30 DIAGNOSIS — R5383 Other fatigue: Secondary | ICD-10-CM | POA: Insufficient documentation

## 2021-06-30 DIAGNOSIS — Z8269 Family history of other diseases of the musculoskeletal system and connective tissue: Secondary | ICD-10-CM | POA: Diagnosis not present

## 2021-06-30 DIAGNOSIS — G2581 Restless legs syndrome: Secondary | ICD-10-CM | POA: Insufficient documentation

## 2021-06-30 DIAGNOSIS — R600 Localized edema: Secondary | ICD-10-CM | POA: Insufficient documentation

## 2021-06-30 DIAGNOSIS — Z8249 Family history of ischemic heart disease and other diseases of the circulatory system: Secondary | ICD-10-CM | POA: Insufficient documentation

## 2021-06-30 DIAGNOSIS — E611 Iron deficiency: Secondary | ICD-10-CM | POA: Diagnosis not present

## 2021-06-30 DIAGNOSIS — Z9049 Acquired absence of other specified parts of digestive tract: Secondary | ICD-10-CM | POA: Insufficient documentation

## 2021-06-30 DIAGNOSIS — Z886 Allergy status to analgesic agent status: Secondary | ICD-10-CM | POA: Insufficient documentation

## 2021-06-30 DIAGNOSIS — Z888 Allergy status to other drugs, medicaments and biological substances status: Secondary | ICD-10-CM | POA: Diagnosis not present

## 2021-06-30 DIAGNOSIS — D631 Anemia in chronic kidney disease: Secondary | ICD-10-CM | POA: Diagnosis not present

## 2021-06-30 DIAGNOSIS — Z820 Family history of epilepsy and other diseases of the nervous system: Secondary | ICD-10-CM | POA: Diagnosis not present

## 2021-06-30 DIAGNOSIS — D509 Iron deficiency anemia, unspecified: Secondary | ICD-10-CM

## 2021-06-30 DIAGNOSIS — M069 Rheumatoid arthritis, unspecified: Secondary | ICD-10-CM | POA: Diagnosis not present

## 2021-06-30 DIAGNOSIS — Z79899 Other long term (current) drug therapy: Secondary | ICD-10-CM | POA: Insufficient documentation

## 2021-06-30 LAB — IRON AND TIBC
Iron: 47 ug/dL (ref 28–170)
Saturation Ratios: 15 % (ref 10.4–31.8)
TIBC: 311 ug/dL (ref 250–450)
UIBC: 264 ug/dL

## 2021-06-30 LAB — CBC
HCT: 31 % — ABNORMAL LOW (ref 36.0–46.0)
Hemoglobin: 10 g/dL — ABNORMAL LOW (ref 12.0–15.0)
MCH: 30.4 pg (ref 26.0–34.0)
MCHC: 32.3 g/dL (ref 30.0–36.0)
MCV: 94.2 fL (ref 80.0–100.0)
Platelets: 313 10*3/uL (ref 150–400)
RBC: 3.29 MIL/uL — ABNORMAL LOW (ref 3.87–5.11)
RDW: 17.2 % — ABNORMAL HIGH (ref 11.5–15.5)
WBC: 8.9 10*3/uL (ref 4.0–10.5)
nRBC: 0 % (ref 0.0–0.2)

## 2021-06-30 LAB — FERRITIN: Ferritin: 85 ng/mL (ref 11–307)

## 2021-06-30 LAB — CREATININE, SERUM
Creatinine, Ser: 1.73 mg/dL — ABNORMAL HIGH (ref 0.44–1.00)
GFR, Estimated: 32 mL/min — ABNORMAL LOW (ref >60)

## 2021-07-01 ENCOUNTER — Inpatient Hospital Stay (HOSPITAL_BASED_OUTPATIENT_CLINIC_OR_DEPARTMENT_OTHER): Payer: Medicare HMO | Admitting: Oncology

## 2021-07-01 ENCOUNTER — Inpatient Hospital Stay: Payer: Medicare HMO

## 2021-07-01 ENCOUNTER — Other Ambulatory Visit: Payer: Self-pay

## 2021-07-01 VITALS — BP 137/66 | HR 86 | Temp 98.2°F | Resp 18 | Wt 222.7 lb

## 2021-07-01 DIAGNOSIS — E785 Hyperlipidemia, unspecified: Secondary | ICD-10-CM | POA: Diagnosis not present

## 2021-07-01 DIAGNOSIS — R5383 Other fatigue: Secondary | ICD-10-CM | POA: Diagnosis not present

## 2021-07-01 DIAGNOSIS — M2550 Pain in unspecified joint: Secondary | ICD-10-CM | POA: Diagnosis not present

## 2021-07-01 DIAGNOSIS — R600 Localized edema: Secondary | ICD-10-CM | POA: Diagnosis not present

## 2021-07-01 DIAGNOSIS — D631 Anemia in chronic kidney disease: Secondary | ICD-10-CM | POA: Diagnosis not present

## 2021-07-01 DIAGNOSIS — E611 Iron deficiency: Secondary | ICD-10-CM | POA: Diagnosis not present

## 2021-07-01 DIAGNOSIS — N183 Chronic kidney disease, stage 3 unspecified: Secondary | ICD-10-CM | POA: Diagnosis not present

## 2021-07-01 DIAGNOSIS — R778 Other specified abnormalities of plasma proteins: Secondary | ICD-10-CM | POA: Diagnosis not present

## 2021-07-01 DIAGNOSIS — D509 Iron deficiency anemia, unspecified: Secondary | ICD-10-CM

## 2021-07-01 DIAGNOSIS — M069 Rheumatoid arthritis, unspecified: Secondary | ICD-10-CM | POA: Diagnosis not present

## 2021-07-01 DIAGNOSIS — M7989 Other specified soft tissue disorders: Secondary | ICD-10-CM | POA: Diagnosis not present

## 2021-07-01 NOTE — Progress Notes (Signed)
Hematology/Oncology Consult note [facility]  [phone] [phone]  Patient Care Team: [name], NP as PCP - General (Internal Medicine) Candis Shine, MD as Referring Physician (Gastroenterology) Lequita Asal, MD as Referring Physician (Hematology and Oncology) Marlowe Sax, MD as Referring Physician (Internal Medicine)   Name of the patient: Tonya Cummings  [id number]  [birth year]   Date of visit: 07/01/21  Diagnosis-anemia likely multifactorial secondary to iron deficiency as well as component of anemia of chronic disease from rheumatoid arthritis and anemia of chronic kidney disease  Chief complaint/ Reason for visit-routine follow-up of anemia  Heme/Onc history: Patient is a 70 year old female with a history of seropositive rheumatoid arthritis for which she is on Plaquenil methotrexate and Humira.  She was seen by Dr. Mike Gip back in September 2020 when her hemoglobin was 8 and she had conclusive evidence of iron deficiency with a ferritin level of 10 and iron saturation of 6%.  She received Venofer back then and her hemoglobin improved to 10 and has remained around that range since then.Colonoscopy on 08/15/2020 revealed one 5 mm polyp in the proximal ascending colon (tubular adenoma). There was diverticulosis in the sigmoid colon. EGD was normal.  B12 and folate have been normal in the past  She has stage III CKD and follows up with Dr. Holley Raring.   Interval history-patient reports baseline fatigue and joint pain.  Reports bilateral leg edema which has been somewhat worse in the last 1 week.  ECOG PS- 1 Pain scale- 3   Review of systems- Review of Systems  Constitutional:  Positive for malaise/fatigue. Negative for chills, fever and weight loss.  HENT:  Negative for congestion, ear discharge and nosebleeds.   Eyes:  Negative for blurred vision.  Respiratory:  Negative for cough, hemoptysis,  sputum production, shortness of breath and wheezing.   Cardiovascular:  Positive for leg swelling. Negative for chest pain, palpitations, orthopnea and claudication.  Gastrointestinal:  Negative for abdominal pain, blood in stool, constipation, diarrhea, heartburn, melena, nausea and vomiting.  Genitourinary:  Negative for dysuria, flank pain, frequency, hematuria and urgency.  Musculoskeletal:  Negative for back pain, joint pain and myalgias.  Skin:  Negative for rash.  Neurological:  Negative for dizziness, tingling, focal weakness, seizures, weakness and headaches.  Endo/Heme/Allergies:  Does not bruise/bleed easily.  Psychiatric/Behavioral:  Negative for depression and suicidal ideas. The patient does not have insomnia.       Allergies  Allergen Reactions   Gabapentin Other (See Comments)    Weight gain   Ibuprofen Other (See Comments)    Headache     Past Medical History:  Diagnosis Date   Anxiety    Chronic kidney disease    Chronic pain    Chronic radicular pain of lower back    Costochondritis    Depression    Encounter for blood transfusion    GERD (gastroesophageal reflux disease)    Headache    migraines   Hip dysplasia    Hyperlipidemia    Hypertension    Iron deficiency anemia    Low back pain    Low vitamin B12 level    Obesity    Osteoporosis    Pelvic fracture (HCC)    Raynaud's disease without gangrene    Restless leg syndrome    Rheumatoid aortitis    Rheumatoid arthritis (HCC)    polyarthritis   Rheumatoid arthritis (HCC)    Seasonal allergies    Sleep disorder  Thoracic compression fracture [facility])      Past Surgical History:  Procedure Laterality Date   ABDOMINAL SURGERY     pt denies   APPENDECTOMY     CARPAL TUNNEL RELEASE Bilateral    CHOLECYSTECTOMY     COLONOSCOPY WITH PROPOFOL N/A 08/15/2020   Procedure: COLONOSCOPY WITH PROPOFOL;  Surgeon: Robert Bellow, MD;  Location: [place];  Service: Endoscopy;  Laterality: N/A;    DILATION AND CURETTAGE OF UTERUS     ESOPHAGOGASTRODUODENOSCOPY (EGD) WITH PROPOFOL N/A 08/15/2020   Procedure: ESOPHAGOGASTRODUODENOSCOPY (EGD) WITH PROPOFOL;  Surgeon: Robert Bellow, MD;  Location: [facility];  Service: Endoscopy;  Laterality: N/A;   FRACTURE SURGERY     hip fracture    FRACTURE SURGERY     pelvic fracture plate    HIP SURGERY Left    KNEE ARTHROPLASTY Right 10/13/2020   Procedure: COMPUTER ASSISTED TOTAL KNEE ARTHROPLASTY - RNFA;  Surgeon: Dereck Leep, MD;  Location: [facility];  Service: Orthopedics;  Laterality: Right;   TUBAL LIGATION      Social History   Socioeconomic History   Marital status: Married    Spouse name: Not on file   Number of children: Not on file   Years of education: Not on file   Highest education level: Not on file  Occupational History   Not on file  Tobacco Use   Smoking status: Never   Smokeless tobacco: Never  Vaping Use   Vaping Use: Never used  Substance and Sexual Activity   Alcohol use: No   Drug use: No   Sexual activity: Yes  Other Topics Concern   Not on file  Social History Narrative   Not on file   Social Determinants of Health   Financial Resource Strain: Not on file  Food Insecurity: Not on file  Transportation Needs: Not on file  Physical Activity: Not on file  Stress: Not on file  Social Connections: Not on file  Intimate Partner Violence: Not on file    Family History  Problem Relation Age of Onset   Diabetes Mother    Hypertension Mother    Aneurysm Father    Diabetes Son    Seizures Son    Osteosarcoma Brother    Cancer Brother    Diabetes Brother    Diabetes Maternal Grandfather    Heart disease Maternal Grandfather      Current Outpatient Medications:    Acetaminophen-Caffeine (EXCEDRIN TENSION HEADACHE) 500-65 MG TABS, Take 1 tablet by mouth daily as needed (Headache)., Disp: , Rfl:    ASCORBIC ACID PO, Take 1 tablet by mouth daily. , Disp: , Rfl:    calcitRIOL (ROCALTROL)  0.25 MCG capsule, Take by mouth., Disp: , Rfl:    Calcium Carb-Cholecalciferol 600-400 MG-UNIT CAPS, Take 1 capsule by mouth 2 (two) times daily., Disp: , Rfl:    Cholecalciferol 50 MCG (2000 UT) CAPS, Take 2,000 Units by mouth daily., Disp: , Rfl:    cyclobenzaprine (FLEXERIL) 10 MG tablet, Take 10 mg by mouth at bedtime., Disp: , Rfl:    enalapril-hydrochlorothiazide (VASERETIC) 10-25 MG per tablet, Take 1 tablet by mouth daily., Disp: , Rfl:    ferrous sulfate 325 (65 FE) MG tablet, Take 325 mg by mouth daily with breakfast., Disp: , Rfl:    FLUoxetine (PROZAC) 40 MG capsule, Take 40 mg by mouth daily., Disp: , Rfl:    folic acid (FOLVITE) 1 MG tablet, Take 1 mg by mouth daily., Disp: , Rfl:  hydroxychloroquine (PLAQUENIL) 200 MG tablet, Take 1 tablet by mouth 2 (two) times daily., Disp: , Rfl:    methotrexate (50 MG/ML) 1 g injection, Inject 25 mg into the vein once a week. .6 ml, Disp: , Rfl:    mirtazapine (REMERON) 30 MG tablet, , Disp: , Rfl:    Multiple Vitamins-Minerals (MULTIVITAMIN ADULT PO), Take 1 tablet by mouth daily. , Disp: , Rfl:    omeprazole (PRILOSEC) 40 MG capsule, Take 40 mg by mouth 2 (two) times daily. , Disp: , Rfl:    pramipexole (MIRAPEX) 0.125 MG tablet, Take 0.125 mg by mouth daily. Take 0.125 mg in the morning and 2 at bedtime, Disp: , Rfl:    Propylene Glycol 0.6 % SOLN, Apply to eye., Disp: , Rfl:    RINVOQ 15 MG TB24, Take 1 tablet by mouth daily., Disp: , Rfl:    rosuvastatin (CRESTOR) 5 MG tablet, Take 5 mg by mouth every other day. , Disp: , Rfl:    spironolactone (ALDACTONE) 25 MG tablet, , Disp: , Rfl:    triamcinolone ointment (KENALOG) 0.5 %, Apply topically 2 (two) times daily., Disp: , Rfl:    vitamin B-12 (CYANOCOBALAMIN) 500 MCG tablet, Take 500 mcg by mouth daily., Disp: , Rfl:    Adalimumab 40 MG/0.4ML PSKT, Inject 40 mg into the skin every 14 (fourteen) days.  (Patient not taking: Reported on 07/01/2021), Disp: , Rfl:   Physical exam:  Vitals:    07/01/21 1126 07/01/21 1128  BP:  137/66  Pulse:  86  Resp:  18  Temp:  98.2 F (36.8 C)  SpO2:  100%  Weight: 222 lb 10.6 oz (101 kg)    Physical Exam Constitutional:      General: She is not in acute distress. Cardiovascular:     Rate and Rhythm: Normal rate and regular rhythm.     Heart sounds: Normal heart sounds.  Pulmonary:     Effort: Pulmonary effort is normal.     Breath sounds: Normal breath sounds.  Abdominal:     General: Bowel sounds are normal.     Palpations: Abdomen is soft.  Musculoskeletal:     Comments: Bilateral +1 edema  Skin:    General: Skin is warm and dry.  Neurological:     Mental Status: She is alert and oriented to person, place, and time.     CMP Latest Ref Rng & Units 06/30/2021  Glucose 70 - 99 mg/dL -  BUN 8 - 23 mg/dL -  Creatinine 0.44 - 1.00 mg/dL 1.73(H)  Sodium 135 - 145 mmol/L -  Potassium 3.5 - 5.1 mmol/L -  Chloride 98 - 111 mmol/L -  CO2 22 - 32 mmol/L -  Calcium 8.9 - 10.3 mg/dL -  Total Protein 6.5 - 8.1 g/dL -  Total Bilirubin 0.3 - 1.2 mg/dL -  Alkaline Phos 38 - 126 U/L -  AST 15 - 41 U/L -  ALT 0 - 44 U/L -   CBC Latest Ref Rng & Units 06/30/2021  WBC 4.0 - 10.5 K/uL 8.9  Hemoglobin 12.0 - 15.0 g/dL 10.0(L)  Hematocrit 36.0 - 46.0 % 31.0(L)  Platelets 150 - 400 K/uL 313    Assessment and plan- Patient is a 70 y.o. female with anemia that is multifactorial secondary to iron deficiency as well as anemia of chronic disease and kidney disease here for routine follow-up  Patient's H&H is 10/31 which is close to her baseline  for the last 2 years.  Her  ferritin levels are 85 with an iron saturation of 15%.  Typically with CKD we can aim to keep ferritin closer to 200 with an iron saturation of more than 20%.  Patient notes that she has not had any significant improvement in her symptoms despite getting IV iron in the past.  I will hold off on getting any IV iron at this time and repeat labs in 3 months in 6 months and see  her back in 6 months.  If hemoglobin drifts down or if iron studies are worse we will consider giving her IV iron at that time.  Patient had blood work done at nephrology office in May 2022 and was noted to have possible M protein.  I will check myeloma panel and serum free light chains today   Visit Diagnosis 1. Iron deficiency anemia, unspecified iron deficiency anemia type   2. Abnormal SPEP   3. Anemia of chronic kidney failure, stage 3 (moderate) (HCC)      Dr. Randa Evens, MD, MPH [facility] [phone] 07/01/2021 12:43 PM

## 2021-07-01 NOTE — Progress Notes (Signed)
Kidney doctor told her there was some blood in her urine.

## 2021-07-02 LAB — KAPPA/LAMBDA LIGHT CHAINS
Kappa free light chain: 12.5 mg/L (ref 3.3–19.4)
Kappa, lambda light chain ratio: 1.42 (ref 0.26–1.65)
Lambda free light chains: 8.8 mg/L (ref 5.7–26.3)

## 2021-07-03 LAB — PROTEIN ELECTRO, RANDOM URINE
Albumin ELP, Urine: 22.4 %
Alpha-1-Globulin, U: 11.4 %
Alpha-2-Globulin, U: 11.2 %
Beta Globulin, U: 37.7 %
Gamma Globulin, U: 17.3 %
Total Protein, Urine: 10 mg/dL

## 2021-07-07 LAB — MULTIPLE MYELOMA PANEL, SERUM
Albumin SerPl Elph-Mcnc: 3.9 g/dL (ref 2.9–4.4)
Albumin/Glob SerPl: 1.1 (ref 0.7–1.7)
Alpha 1: 0.2 g/dL (ref 0.0–0.4)
Alpha2 Glob SerPl Elph-Mcnc: 0.8 g/dL (ref 0.4–1.0)
B-Globulin SerPl Elph-Mcnc: 1.9 g/dL — ABNORMAL HIGH (ref 0.7–1.3)
Gamma Glob SerPl Elph-Mcnc: 0.9 g/dL (ref 0.4–1.8)
Globulin, Total: 3.8 g/dL (ref 2.2–3.9)
IgA: 216 mg/dL (ref 87–352)
IgG (Immunoglobin G), Serum: 1785 mg/dL — ABNORMAL HIGH (ref 586–1602)
IgM (Immunoglobulin M), Srm: 126 mg/dL (ref 26–217)
M Protein SerPl Elph-Mcnc: 1.1 g/dL — ABNORMAL HIGH
Total Protein ELP: 7.7 g/dL (ref 6.0–8.5)

## 2021-09-23 ENCOUNTER — Other Ambulatory Visit: Payer: Self-pay

## 2021-09-23 ENCOUNTER — Ambulatory Visit
Admission: RE | Admit: 2021-09-23 | Discharge: 2021-09-23 | Disposition: A | Discharge status: Home / Self Care | Payer: Medicare HMO | Source: Ambulatory Visit | Attending: Emergency Medicine | Admitting: Emergency Medicine

## 2021-09-23 ENCOUNTER — Ambulatory Visit (INDEPENDENT_AMBULATORY_CARE_PROVIDER_SITE_OTHER): Payer: Medicare HMO

## 2021-09-23 VITALS — BP 172/70 | HR 100 | Temp 98.9°F | Resp 24

## 2021-09-23 DIAGNOSIS — R0602 Shortness of breath: Secondary | ICD-10-CM

## 2021-09-23 DIAGNOSIS — K449 Diaphragmatic hernia without obstruction or gangrene: Secondary | ICD-10-CM | POA: Diagnosis not present

## 2021-09-23 DIAGNOSIS — R0609 Other forms of dyspnea: Secondary | ICD-10-CM | POA: Diagnosis not present

## 2021-09-23 MED ORDER — ALBUTEROL SULFATE HFA 108 (90 BASE) MCG/ACT IN AERS: 2.0000 | INHALATION_SPRAY | RESPIRATORY_TRACT | 0 refills | Status: DC | PRN

## 2021-09-23 NOTE — Discharge Instructions (Addendum)
Your chest x-ray did not show any evidence of pneumonia and your EKG did not show any evidence of cardiac ischemia.  This does not rule out inefficient pumping of your heart.  I am referring you to cardiology for further evaluation of your shortness of breath with exertion and lower leg swelling.  In the meantime, please use the albuterol inhaler, 2 puffs every 4-6 hours as needed for shortness of breath.  If you develop any increasing shortness of breath, especially with coupled with chest pain, nausea, or sweating please go to the emergency department for evaluation.

## 2021-09-23 NOTE — ED Triage Notes (Signed)
Pt presents with c/o of feeling sob for last 2 weeks, denies cough or uri symptoms, has had some chest discomfort

## 2021-09-23 NOTE — ED Provider Notes (Signed)
[facility]    CSN: [id number] Arrival date & time: 09/23/21  1258      History   Chief Complaint Chief Complaint  Patient presents with   Shortness of Breath    HPI Tonya Cummings is a 70 y.o. female.   HPI  70 year old female here for evaluation of shortness of breath.  Patient reports that she has been experiencing shortness of breath for the past 2 weeks and that over the last week it has worsened.  This is not associated with cough, runny nose, nasal congestion.  She states that she does have intermittent midsternal chest pain that does not radiate and is not associated with nausea or sweating.  These shortness of breath increases with movement and improves at rest.  She states that she has had swelling in both of her lower extremities for the past year since having knee surgery.  She is supposed to wear compression hose but she does not.  She is unsure if she has any shortness of breath with laying flat as she sleeps upright due to the pain in her knees.  Patient has not discussed this with her primary care at all.  Past Medical History:  Diagnosis Date   Anxiety    Chronic kidney disease    Chronic pain    Chronic radicular pain of lower back    Costochondritis    Depression    Encounter for blood transfusion    GERD (gastroesophageal reflux disease)    Headache    migraines   Hip dysplasia    Hyperlipidemia    Hypertension    Iron deficiency anemia    Low back pain    Low vitamin B12 level    Obesity    Osteoporosis    Pelvic fracture (HCC)    Raynaud's disease without gangrene    Restless leg syndrome    Rheumatoid aortitis    Rheumatoid arthritis (HCC)    polyarthritis   Rheumatoid arthritis ([place])    Seasonal allergies    Sleep disorder    Thoracic compression fracture [facility])     Patient Active Problem List   Diagnosis Date Noted   Total knee replacement status 10/13/2020   CKD (chronic kidney disease) stage 3, GFR 30-59 ml/min  ([place]) 10/12/2020   Hyperlipidemia 10/12/2020   Hypertension 10/12/2020   Migraine headache 10/12/2020   Restless legs syndrome (RLS) 10/12/2020   Severe obesity (BMI 35.0-39.9) with comorbidity ([place]) 06/30/2020   Primary osteoarthritis of right knee 03/27/2020   Iron deficiency anemia 09/12/2019   Low vitamin B12 level 09/12/2019   Normocytic anemia 09/07/2019   Right medial knee pain 08/09/2018   Depression with anxiety 03/22/2018   Sleep disorder 03/22/2018   Influenza A 02/17/2018   Community acquired pneumonia 02/17/2018   Chronic radicular pain of lower back 02/06/2018   Numbness and tingling of right leg 02/06/2018   Hip dysplasia 02/23/2016   Osteoporosis, post-menopausal 02/23/2016   Raynaud's disease without gangrene 02/23/2016   Seropositive rheumatoid arthritis ([place]) 02/23/2016   Lumbar facet joint pain 09/18/2014    Past Surgical History:  Procedure Laterality Date   ABDOMINAL SURGERY     pt denies   APPENDECTOMY     CARPAL TUNNEL RELEASE Bilateral    CHOLECYSTECTOMY     COLONOSCOPY WITH PROPOFOL N/A 08/15/2020   Procedure: COLONOSCOPY WITH PROPOFOL;  Surgeon: Robert Bellow, MD;  Location: [facility] ENDOSCOPY;  Service: Endoscopy;  Laterality: N/A;   DILATION AND CURETTAGE OF UTERUS  ESOPHAGOGASTRODUODENOSCOPY (EGD) WITH PROPOFOL N/A 08/15/2020   Procedure: ESOPHAGOGASTRODUODENOSCOPY (EGD) WITH PROPOFOL;  Surgeon: Robert Bellow, MD;  Location: [facility] ENDOSCOPY;  Service: Endoscopy;  Laterality: N/A;   FRACTURE SURGERY     hip fracture    FRACTURE SURGERY     pelvic fracture plate    HIP SURGERY Left    KNEE ARTHROPLASTY Right 10/13/2020   Procedure: COMPUTER ASSISTED TOTAL KNEE ARTHROPLASTY - RNFA;  Surgeon: Dereck Leep, MD;  Location: [facility] ORS;  Service: Orthopedics;  Laterality: Right;   TUBAL LIGATION      OB History   No obstetric history on file.      Home Medications    Prior to Admission medications   Medication Sig Start Date End Date  Taking? Authorizing Provider  albuterol (VENTOLIN HFA) 108 (90 Base) MCG/ACT inhaler Inhale 2 puffs into the lungs every 4 (four) hours as needed. 09/23/21  Yes Margarette Canada, NP  Acetaminophen-Caffeine [facility] TENSION HEADACHE) 500-65 MG TABS Take 1 tablet by mouth daily as needed (Headache).    [provider]  Adalimumab 40 MG/0.4ML PSKT Inject 40 mg into the skin every 14 (fourteen) days.  Patient not taking: Reported on 07/01/2021    [provider]  ASCORBIC ACID PO Take 1 tablet by mouth daily.     [provider]  calcitRIOL (ROCALTROL) 0.25 MCG capsule Take by mouth. 05/22/21   [provider]  Calcium Carb-Cholecalciferol 600-400 MG-UNIT CAPS Take 1 capsule by mouth 2 (two) times daily. 01/13/10   [provider]  Cholecalciferol 50 MCG (2000 UT) CAPS Take 2,000 Units by mouth daily. 09/23/09   [provider]  cyclobenzaprine (FLEXERIL) 10 MG tablet Take 10 mg by mouth at bedtime.    [provider]  enalapril-hydrochlorothiazide (VASERETIC) 10-25 MG per tablet Take 1 tablet by mouth daily.    [provider]  ferrous sulfate 325 (65 FE) MG tablet Take 325 mg by mouth daily with breakfast.    [provider]  FLUoxetine (PROZAC) 40 MG capsule Take 40 mg by mouth daily.    [provider]  folic acid (FOLVITE) 1 MG tablet Take 1 mg by mouth daily.    [provider]  hydroxychloroquine (PLAQUENIL) 200 MG tablet Take 1 tablet by mouth 2 (two) times daily. 06/22/21   [provider]  methotrexate (50 MG/ML) 1 g injection Inject 25 mg into the vein once a week. .6 ml    [provider]  mirtazapine (REMERON) 30 MG tablet  05/12/21   [provider]  Multiple Vitamins-Minerals (MULTIVITAMIN ADULT PO) Take 1 tablet by mouth daily.  09/10/08   [provider]  omeprazole (PRILOSEC) 40 MG capsule Take 40 mg by mouth 2 (two) times daily.  01/11/20   [provider]  pramipexole (MIRAPEX) 0.125 MG tablet Take 0.125 mg by mouth daily. Take 0.125 mg in the morning and 2 at bedtime 02/15/18   [provider]  Propylene Glycol 0.6 % SOLN Apply to eye.    [provider]  RINVOQ 15 MG TB24 Take 1 tablet by mouth daily. 03/09/21   [provider]  rosuvastatin (CRESTOR) 5 MG tablet Take 5 mg by mouth every other day.  07/18/19   [provider]  spironolactone (ALDACTONE) 25 MG tablet  04/09/21   [provider]  triamcinolone ointment (KENALOG) 0.5 % Apply topically 2 (two) times daily. 04/08/21 04/08/22  [provider]  vitamin B-12 (CYANOCOBALAMIN) 500 MCG tablet  Take 500 mcg by mouth daily.    [provider]    Family History Family History  Problem Relation Age of Onset   Diabetes Mother    Hypertension Mother    Aneurysm Father    Diabetes Son    Seizures Son    Osteosarcoma Brother    Cancer Brother    Diabetes Brother    Diabetes Maternal Grandfather    Heart disease Maternal Grandfather     Social History Social History   Tobacco Use   Smoking status: Never   Smokeless tobacco: Never  Vaping Use   Vaping Use: Never used  Substance Use Topics   Alcohol use: No   Drug use: No     Allergies   Gabapentin and Ibuprofen   Review of Systems Review of Systems  Constitutional:  Negative for activity change, appetite change, diaphoresis and fever.  HENT:  Negative for congestion, rhinorrhea and sore throat.   Respiratory:  Positive for shortness of breath. Negative for cough and wheezing.   Cardiovascular:  Positive for chest pain and leg swelling. Negative for palpitations.  Gastrointestinal:  Negative for nausea.  Skin:  Negative for rash.  Hematological: Negative.   Psychiatric/Behavioral: Negative.      Physical Exam Triage Vital Signs ED Triage Vitals  Enc Vitals Group     BP 09/23/21 1351 (!) 172/70     Pulse Rate 09/23/21 1351 100     Resp 09/23/21 1351 (!)  24     Temp 09/23/21 1351 98.9 F (37.2 C)     Temp src --      SpO2 09/23/21 1351 100 %     Weight --      Height --      Head Circumference --      Peak Flow --      Pain Score 09/23/21 1352 3     Pain Loc --      Pain Edu? --      Excl. in [place]? --    No data found.  Updated Vital Signs BP (!) 172/70   Pulse 100   Temp 98.9 F (37.2 C)   Resp (!) 24   SpO2 100%   Visual Acuity Right Eye Distance:   Left Eye Distance:   Bilateral Distance:    Right Eye Near:   Left Eye Near:    Bilateral Near:     Physical Exam Vitals and nursing note reviewed.  Constitutional:      General: She is in acute distress.     Appearance: She is obese. She is not ill-appearing or diaphoretic.  HENT:     Head: Normocephalic and atraumatic.  Cardiovascular:     Rate and Rhythm: Normal rate and regular rhythm.     Pulses: Normal pulses.     Heart sounds: Normal heart sounds.    No gallop.  Pulmonary:     Effort: Pulmonary effort is normal.     Breath sounds: Normal breath sounds. No wheezing, rhonchi or rales.  Musculoskeletal:     Right lower leg: Edema present.     Left lower leg: Edema present.  Skin:    General: Skin is warm and dry.     Capillary Refill: Capillary refill takes 2 to 3 seconds.     Findings: No bruising or erythema.  Neurological:     General: No focal deficit present.     Mental Status: She is alert and oriented to person, place, and time.  Psychiatric:  Mood and Affect: Mood normal.        Thought Content: Thought content normal.        Judgment: Judgment normal.     UC Treatments / Results  Labs (all labs ordered are listed, but only abnormal results are displayed) Labs Reviewed - No data to display  EKG Normal sinus rhythm with a ventricular rate of 92 bpm PR interval 158 ms QRS duration 80 ms QT/QTc 350/432 ms No ST or T wave abnormalities appreciated.  No change when compared to EKG from 10/03/2020   Radiology DG Chest 2  View  Result Date: 09/23/2021 CLINICAL DATA:  Shortness of breath for 2 weeks EXAM: CHEST - 2 VIEW COMPARISON:  02/17/2018 FINDINGS: Unchanged cardiomediastinal silhouette. Hiatal hernia. No focal airspace consolidation. There is no large pleural effusion or visible pneumothorax. There is no acute osseous abnormality. Thoracic spondylosis. IMPRESSION: No evidence of acute cardiopulmonary disease.  Hiatal hernia. Electronically Signed   By: Maurine Simmering M.D.   On: 09/23/2021 15:14    Procedures Procedures (including critical care time)  Medications Ordered in UC Medications - No data to display  Initial Impression / Assessment and Plan / UC Course  I have reviewed the triage vital signs and the nursing notes.  Pertinent labs & imaging results that were available during my care of the patient were reviewed by me and considered in my medical decision making (see chart for details).  Patient is a nontoxic though anxious appearing 71 year old female here for evaluation of 2 weeks worth of shortness of breath.  The shortness of breath has increased over the past week and is made worse with movement.  There has been associated midsternal chest pain that comes and goes and is not present currently.  She denies any sweating or nausea.  When asked about increasing shortness of breath when lying flat she states that she sleeps upright in a chair secondary to pain in both of her knees and she has been doing that for quite some time.  She does have swelling in both of her lower extremities that has been present for the past year since she underwent knee surgery.  She has a wedge pillow and tries to keep her legs elevated is much as possible and she is supposed be wearing compression hose but she does not wear them.  I asked the patient if she had spoken to her primary care provider about this but she has not at this point.  Patient has a history of iron deficiency anemia, CKD stage III, severe obesity,  osteoarthritis and rheumatoid arthritis, anxiety and depression.  No cardiac history listed in epic.  Patient's physical exam reveals a patient who can speak in complete sentences without dyspnea.  Heart sounds are S1-S2 without murmur, rub, or gallop.  Lung sounds are clear but they are decreased towards the bases.  This may be secondary to patient's body habitus.  Patient's bilateral lower extremities are edematous with 2+ pitting edema to the level of mid shin bilaterally.  Peripheral pulses are 2+ globally.  EKG was collected at triage which showed normal sinus rhythm.  This EKG was compared to EKG from 10/03/2020 and there were no changes noted when the 2 are compared.  Differential diagnosis for patient's shortness of breath includes new onset CHF, anemia, and anxiety state.  Chest x-ray independently reviewed and evaluated by me.  Impression: There is elevation of the right hemidiaphragm and the lung markings are very prominent.  There appears to be  a significant pleural effusion in the right lung base when looking at the images laterally.  This was compared to a previous checks x-ray from October 28, 2012.  Awaiting radiology overread. Radiology interpretation of chest x-ray is no evidence of acute cardiopulmonary disease.  Hiatal hernia.  I spoke with Dr. Yancey Flemings from radiology in regards to the patient's chest x-ray and the horizontal line anterior the heart shadow.  He is unsure what to make of that and he states it could be overlapping body tissue versus pericardial fat.  Given patient's history of lower leg edema coupled with her new onset and increasing shortness of breath will refer patient to cardiology for further evaluation and discharge her home with an albuterol inhaler to help her with shortness of breath as needed.  Patient advised to go to the emergency department if she develops any chest pain or worsening shortness of breath.  Patient's creatinine is 1.73 with a GFR of 32 so I do not feel  comfortable prescribing diuretics at this time.   Final Clinical Impressions(s) / UC Diagnoses   Final diagnoses:  Dyspnea on exertion     Discharge Instructions      Your chest x-ray did not show any evidence of pneumonia and your EKG did not show any evidence of cardiac ischemia.  This does not rule out inefficient pumping of your heart.  I am referring you to cardiology for further evaluation of your shortness of breath with exertion and lower leg swelling.  In the meantime, please use the albuterol inhaler, 2 puffs every 4-6 hours as needed for shortness of breath.  If you develop any increasing shortness of breath, especially with coupled with chest pain, nausea, or sweating please go to the emergency department for evaluation.     ED Prescriptions     Medication Sig Dispense Auth. Provider   albuterol (VENTOLIN HFA) 108 (90 Base) MCG/ACT inhaler Inhale 2 puffs into the lungs every 4 (four) hours as needed. 18 g Margarette Canada, NP      PDMP not reviewed this encounter.   Margarette Canada, NP 09/23/21 1547

## 2021-09-29 ENCOUNTER — Encounter: Payer: Self-pay | Admitting: Emergency Medicine

## 2021-09-29 ENCOUNTER — Inpatient Hospital Stay: Payer: Medicare HMO

## 2021-09-29 ENCOUNTER — Other Ambulatory Visit: Payer: Self-pay

## 2021-09-29 ENCOUNTER — Inpatient Hospital Stay
Admission: EM | Admit: 2021-09-29 | Discharge: 2021-10-02 | DRG: 291 | Disposition: A | Discharge status: Home / Self Care | Payer: Medicare HMO | Attending: Internal Medicine | Admitting: Internal Medicine

## 2021-09-29 ENCOUNTER — Emergency Department: Payer: Medicare HMO

## 2021-09-29 DIAGNOSIS — I5031 Acute diastolic (congestive) heart failure: Secondary | ICD-10-CM | POA: Diagnosis not present

## 2021-09-29 DIAGNOSIS — E875 Hyperkalemia: Secondary | ICD-10-CM | POA: Diagnosis present

## 2021-09-29 DIAGNOSIS — N1832 Chronic kidney disease, stage 3b: Secondary | ICD-10-CM | POA: Diagnosis present

## 2021-09-29 DIAGNOSIS — E785 Hyperlipidemia, unspecified: Secondary | ICD-10-CM | POA: Diagnosis present

## 2021-09-29 DIAGNOSIS — R0602 Shortness of breath: Secondary | ICD-10-CM | POA: Diagnosis not present

## 2021-09-29 DIAGNOSIS — Z20822 Contact with and (suspected) exposure to covid-19: Secondary | ICD-10-CM | POA: Diagnosis not present

## 2021-09-29 DIAGNOSIS — Z8249 Family history of ischemic heart disease and other diseases of the circulatory system: Secondary | ICD-10-CM

## 2021-09-29 DIAGNOSIS — N179 Acute kidney failure, unspecified: Secondary | ICD-10-CM | POA: Diagnosis not present

## 2021-09-29 DIAGNOSIS — J9601 Acute respiratory failure with hypoxia: Secondary | ICD-10-CM | POA: Diagnosis not present

## 2021-09-29 DIAGNOSIS — G8929 Other chronic pain: Secondary | ICD-10-CM | POA: Diagnosis present

## 2021-09-29 DIAGNOSIS — I130 Hypertensive heart and chronic kidney disease with heart failure and stage 1 through stage 4 chronic kidney disease, or unspecified chronic kidney disease: Secondary | ICD-10-CM | POA: Diagnosis not present

## 2021-09-29 DIAGNOSIS — Z79899 Other long term (current) drug therapy: Secondary | ICD-10-CM | POA: Diagnosis not present

## 2021-09-29 DIAGNOSIS — M545 Low back pain, unspecified: Secondary | ICD-10-CM | POA: Diagnosis present

## 2021-09-29 DIAGNOSIS — M059 Rheumatoid arthritis with rheumatoid factor, unspecified: Secondary | ICD-10-CM | POA: Diagnosis not present

## 2021-09-29 DIAGNOSIS — Z6838 Body mass index (BMI) 38.0-38.9, adult: Secondary | ICD-10-CM

## 2021-09-29 DIAGNOSIS — K449 Diaphragmatic hernia without obstruction or gangrene: Secondary | ICD-10-CM | POA: Diagnosis not present

## 2021-09-29 DIAGNOSIS — J9690 Respiratory failure, unspecified, unspecified whether with hypoxia or hypercapnia: Secondary | ICD-10-CM | POA: Diagnosis not present

## 2021-09-29 DIAGNOSIS — I509 Heart failure, unspecified: Secondary | ICD-10-CM

## 2021-09-29 DIAGNOSIS — Z96651 Presence of right artificial knee joint: Secondary | ICD-10-CM | POA: Diagnosis present

## 2021-09-29 DIAGNOSIS — M069 Rheumatoid arthritis, unspecified: Secondary | ICD-10-CM | POA: Diagnosis not present

## 2021-09-29 DIAGNOSIS — J302 Other seasonal allergic rhinitis: Secondary | ICD-10-CM | POA: Diagnosis present

## 2021-09-29 DIAGNOSIS — Z888 Allergy status to other drugs, medicaments and biological substances status: Secondary | ICD-10-CM

## 2021-09-29 DIAGNOSIS — F419 Anxiety disorder, unspecified: Secondary | ICD-10-CM | POA: Diagnosis present

## 2021-09-29 DIAGNOSIS — G2581 Restless legs syndrome: Secondary | ICD-10-CM | POA: Diagnosis present

## 2021-09-29 DIAGNOSIS — I5021 Acute systolic (congestive) heart failure: Secondary | ICD-10-CM | POA: Diagnosis not present

## 2021-09-29 DIAGNOSIS — Z833 Family history of diabetes mellitus: Secondary | ICD-10-CM

## 2021-09-29 DIAGNOSIS — I10 Essential (primary) hypertension: Secondary | ICD-10-CM | POA: Diagnosis not present

## 2021-09-29 DIAGNOSIS — N183 Chronic kidney disease, stage 3 unspecified: Secondary | ICD-10-CM | POA: Diagnosis present

## 2021-09-29 DIAGNOSIS — R0600 Dyspnea, unspecified: Secondary | ICD-10-CM | POA: Diagnosis not present

## 2021-09-29 DIAGNOSIS — G479 Sleep disorder, unspecified: Secondary | ICD-10-CM | POA: Diagnosis present

## 2021-09-29 DIAGNOSIS — R600 Localized edema: Secondary | ICD-10-CM | POA: Diagnosis not present

## 2021-09-29 LAB — RESP PANEL BY RT-PCR (FLU A&B, COVID) ARPGX2
Influenza A by PCR: NEGATIVE
Influenza B by PCR: NEGATIVE
SARS Coronavirus 2 by RT PCR: NEGATIVE

## 2021-09-29 LAB — TROPONIN I (HIGH SENSITIVITY)
Troponin I (High Sensitivity): 75 ng/L — ABNORMAL HIGH (ref <18)
Troponin I (High Sensitivity): 86 ng/L — ABNORMAL HIGH (ref <18)

## 2021-09-29 LAB — BASIC METABOLIC PANEL
Anion gap: 10 (ref 5–15)
BUN: 36 mg/dL — ABNORMAL HIGH (ref 8–23)
CO2: 26 mmol/L (ref 22–32)
Calcium: 9.1 mg/dL (ref 8.9–10.3)
Chloride: 101 mmol/L (ref 98–111)
Creatinine, Ser: 1.63 mg/dL — ABNORMAL HIGH (ref 0.44–1.00)
GFR, Estimated: 34 mL/min — ABNORMAL LOW (ref >60)
Glucose, Bld: 99 mg/dL (ref 70–99)
Potassium: 5.2 mmol/L — ABNORMAL HIGH (ref 3.5–5.1)
Sodium: 137 mmol/L (ref 135–145)

## 2021-09-29 LAB — CBC
HCT: 29.3 % — ABNORMAL LOW (ref 36.0–46.0)
Hemoglobin: 9.7 g/dL — ABNORMAL LOW (ref 12.0–15.0)
MCH: 32.6 pg (ref 26.0–34.0)
MCHC: 33.1 g/dL (ref 30.0–36.0)
MCV: 98.3 fL (ref 80.0–100.0)
Platelets: 292 10*3/uL (ref 150–400)
RBC: 2.98 MIL/uL — ABNORMAL LOW (ref 3.87–5.11)
RDW: 15.9 % — ABNORMAL HIGH (ref 11.5–15.5)
WBC: 10.3 10*3/uL (ref 4.0–10.5)
nRBC: 0 % (ref 0.0–0.2)

## 2021-09-29 LAB — BRAIN NATRIURETIC PEPTIDE: B Natriuretic Peptide: 70 pg/mL (ref 0.0–100.0)

## 2021-09-29 MED ORDER — SODIUM CHLORIDE 0.9% FLUSH
3.0000 mL | Freq: Two times a day (BID) | INTRAVENOUS | Status: DC
  Administered 2021-09-29 – 2021-10-02 (×5): 3 mL via INTRAVENOUS

## 2021-09-29 MED ORDER — SODIUM CHLORIDE 0.9% FLUSH: 3.0000 mL | INTRAVENOUS | Status: DC | PRN

## 2021-09-29 MED ORDER — SODIUM CHLORIDE 0.9 % IV SOLN: 250.0000 mL | INTRAVENOUS | Status: DC | PRN

## 2021-09-29 MED ORDER — FUROSEMIDE 10 MG/ML IJ SOLN
40.0000 mg | Freq: Once | INTRAMUSCULAR | Status: AC
  Administered 2021-09-29: 40 mg via INTRAVENOUS
  Filled 2021-09-29: qty 4

## 2021-09-29 MED ORDER — ASPIRIN 81 MG PO CHEW
324.0000 mg | CHEWABLE_TABLET | Freq: Once | ORAL | Status: AC
  Administered 2021-09-29: 324 mg via ORAL
  Filled 2021-09-29: qty 4

## 2021-09-29 MED ORDER — FUROSEMIDE 10 MG/ML IJ SOLN
8.0000 mg/h | INTRAVENOUS | Status: DC
  Administered 2021-09-29 – 2021-09-30 (×2): 8 mg/h via INTRAVENOUS
  Filled 2021-09-29 (×2): qty 20

## 2021-09-29 MED ORDER — HYDROXYCHLOROQUINE SULFATE 200 MG PO TABS
200.0000 mg | ORAL_TABLET | Freq: Two times a day (BID) | ORAL | Status: DC
  Administered 2021-09-29 – 2021-10-02 (×6): 200 mg via ORAL
  Filled 2021-09-29 (×9): qty 1

## 2021-09-29 MED ORDER — ACETAMINOPHEN 325 MG PO TABS: 650.0000 mg | ORAL_TABLET | ORAL | Status: DC | PRN

## 2021-09-29 MED ORDER — TECHNETIUM TO 99M ALBUMIN AGGREGATED
4.3300 | Freq: Once | INTRAVENOUS | Status: AC | PRN
  Administered 2021-09-29: 4.33 via INTRAVENOUS
  Filled 2021-09-29: qty 4.33

## 2021-09-29 MED ORDER — HEPARIN SODIUM (PORCINE) 5000 UNIT/ML IJ SOLN
5000.0000 [IU] | Freq: Three times a day (TID) | INTRAMUSCULAR | Status: DC
  Administered 2021-09-29 – 2021-10-02 (×8): 5000 [IU] via SUBCUTANEOUS
  Filled 2021-09-29 (×10): qty 1

## 2021-09-29 MED ORDER — ONDANSETRON HCL 4 MG/2ML IJ SOLN: 4.0000 mg | Freq: Four times a day (QID) | INTRAMUSCULAR | Status: DC | PRN

## 2021-09-29 NOTE — H&P (Addendum)
History and Physical:    Tonya Cummings   FYB:[id number] DOB: Aug 10, 1951 DOA: 09/29/2021  Referring MD/provider: Acquanetta Belling PCP: [name], NP   Patient coming from: Home  Chief Complaint: Shortness of breath  History of Present Illness:   Tonya Cummings is a 70 y.o. female with medical history significant for rheumatoid arthritis, CKD stage IIIb, depression,, anxiety, chronic low back pain, GERD, who presented to the hospital because of increasing shortness of breath.  She says she been feeling short of breath for about 2 weeks now however symptoms worsened about 3 days ago.  She also noticed that she has been wheezing for the past 2 days.  She also complains of leg swelling since November 2021 after she had right knee surgery.  She said she was started on Aldactone by her doctor about 2 months ago for leg swelling.  Lower extremity swelling has gotten worse.  She admits to orthopnea and says she sleeps in a recliner.  But she also reports she sleeps in a recliner because of of her back and knees.  No chest pain, cough, palpitations, dizziness, nausea, vomiting, abdominal pain  ED Course:  The patient was hypertensive and hypoxemic in the emergency room.  Her oxygen saturation dropped to 87% on room air after walking to the bathroom.  She was given IV Lasix in the ED.  ROS:   ROS all other symptoms reviewed were negative  Past Medical History:   Past Medical History:  Diagnosis Date   Anxiety    Chronic kidney disease    Chronic pain    Chronic radicular pain of lower back    Costochondritis    Depression    Encounter for blood transfusion    GERD (gastroesophageal reflux disease)    Headache    migraines   Hip dysplasia    Hyperlipidemia    Hypertension    Iron deficiency anemia    Low back pain    Low vitamin B12 level    Obesity    Osteoporosis    Pelvic fracture (HCC)    Raynaud's disease without gangrene    Restless leg syndrome     Rheumatoid aortitis    Rheumatoid arthritis (HCC)    polyarthritis   Rheumatoid arthritis (HCC)    Seasonal allergies    Sleep disorder    Thoracic compression fracture (HCC)     Past Surgical History:   Past Surgical History:  Procedure Laterality Date   ABDOMINAL SURGERY     pt denies   APPENDECTOMY     CARPAL TUNNEL RELEASE Bilateral    CHOLECYSTECTOMY     COLONOSCOPY WITH PROPOFOL N/A 08/15/2020   Procedure: COLONOSCOPY WITH PROPOFOL;  Surgeon: Robert Bellow, MD;  Location: [place];  Service: Endoscopy;  Laterality: N/A;   DILATION AND CURETTAGE OF UTERUS     ESOPHAGOGASTRODUODENOSCOPY (EGD) WITH PROPOFOL N/A 08/15/2020   Procedure: ESOPHAGOGASTRODUODENOSCOPY (EGD) WITH PROPOFOL;  Surgeon: Robert Bellow, MD;  Location: [facility];  Service: Endoscopy;  Laterality: N/A;   FRACTURE SURGERY     hip fracture    FRACTURE SURGERY     pelvic fracture plate    HIP SURGERY Left    KNEE ARTHROPLASTY Right 10/13/2020   Procedure: COMPUTER ASSISTED TOTAL KNEE ARTHROPLASTY - RNFA;  Surgeon: Dereck Leep, MD;  Location: [facility];  Service: Orthopedics;  Laterality: Right;   TUBAL LIGATION      Social History:   Social History   Socioeconomic History  Marital status: Married    Spouse name: Not on file   Number of children: Not on file   Years of education: Not on file   Highest education level: Not on file  Occupational History   Not on file  Tobacco Use   Smoking status: Never   Smokeless tobacco: Never  Vaping Use   Vaping Use: Never used  Substance and Sexual Activity   Alcohol use: No   Drug use: No   Sexual activity: Yes  Other Topics Concern   Not on file  Social History Narrative   Not on file   Social Determinants of Health   Financial Resource Strain: Not on file  Food Insecurity: Not on file  Transportation Needs: Not on file  Physical Activity: Not on file  Stress: Not on file  Social Connections: Not on file  Intimate Partner  Violence: Not on file    Allergies   Gabapentin and Ibuprofen  Family history:   Family History  Problem Relation Age of Onset   Diabetes Mother    Hypertension Mother    Aneurysm Father    Diabetes Son    Seizures Son    Osteosarcoma Brother    Cancer Brother    Diabetes Brother    Diabetes Maternal Grandfather    Heart disease Maternal Grandfather     Current Medications:   Prior to Admission medications   Medication Sig Start Date End Date Taking? Authorizing Provider  Acetaminophen-Caffeine (EXCEDRIN TENSION HEADACHE) 500-65 MG TABS Take 1 tablet by mouth daily as needed (Headache).    [provider]  Adalimumab 40 MG/0.4ML PSKT Inject 40 mg into the skin every 14 (fourteen) days.  Patient not taking: Reported on 07/01/2021    [provider]  albuterol (VENTOLIN HFA) 108 (90 Base) MCG/ACT inhaler Inhale 2 puffs into the lungs every 4 (four) hours as needed. 09/23/21   Margarette Canada, NP  ASCORBIC ACID PO Take 1 tablet by mouth daily.     [provider]  calcitRIOL (ROCALTROL) 0.25 MCG capsule Take by mouth. 05/22/21   [provider]  Calcium Carb-Cholecalciferol 600-400 MG-UNIT CAPS Take 1 capsule by mouth 2 (two) times daily. 01/13/10   [provider]  Cholecalciferol 50 MCG (2000 UT) CAPS Take 2,000 Units by mouth daily. 09/23/09   [provider]  cyclobenzaprine (FLEXERIL) 10 MG tablet Take 10 mg by mouth at bedtime.    [provider]  enalapril-hydrochlorothiazide (VASERETIC) 10-25 MG per tablet Take 1 tablet by mouth daily.    [provider]  ferrous sulfate 325 (65 FE) MG tablet Take 325 mg by mouth daily with breakfast.    [provider]  FLUoxetine (PROZAC) 40 MG capsule Take 40 mg by mouth daily.    [provider]  folic acid (FOLVITE) 1 MG tablet Take 1 mg by mouth daily.    [provider]  hydroxychloroquine (PLAQUENIL) 200 MG tablet Take 1 tablet by mouth 2  (two) times daily. 06/22/21   [provider]  methotrexate (50 MG/ML) 1 g injection Inject 25 mg into the vein once a week. .6 ml    [provider]  mirtazapine (REMERON) 30 MG tablet  05/12/21   [provider]  Multiple Vitamins-Minerals (MULTIVITAMIN ADULT PO) Take 1 tablet by mouth daily.  09/10/08   [provider]  omeprazole (PRILOSEC) 40 MG capsule Take 40 mg by mouth 2 (two) times daily.  01/11/20   [provider]  pramipexole (  MIRAPEX) 0.125 MG tablet Take 0.125 mg by mouth daily. Take 0.125 mg in the morning and 2 at bedtime 02/15/18   [provider]  Propylene Glycol 0.6 % SOLN Apply to eye.    [provider]  RINVOQ 15 MG TB24 Take 1 tablet by mouth daily. 03/09/21   [provider]  rosuvastatin (CRESTOR) 5 MG tablet Take 5 mg by mouth every other day.  07/18/19   [provider]  spironolactone (ALDACTONE) 25 MG tablet  04/09/21   [provider]  triamcinolone ointment (KENALOG) 0.5 % Apply topically 2 (two) times daily. 04/08/21 04/08/22  [provider]  vitamin B-12 (CYANOCOBALAMIN) 500 MCG tablet Take 500 mcg by mouth daily.    [provider]    Physical Exam:   Vitals:   09/29/21 1058 09/29/21 1146 09/29/21 1255 09/29/21 1300  BP:  (!) 149/69  (!) 163/77  Pulse:  95  (!) 102  Resp:  (!) 27  (!) 25  Temp:      TempSrc:      SpO2:  97% (!) 87% 100%  Weight: 102.1 kg     Height: 5\' 4"  (1.626 m)        Physical Exam: Blood pressure (!) 163/77, pulse (!) 102, temperature 98.4 F (36.9 C), temperature source Oral, resp. rate (!) 25, height 5\' 4"  (1.626 m), weight 102.1 kg, SpO2 100 %. Gen: No acute distress. Head: Normocephalic, atraumatic. Eyes: Pupils equal, round and reactive to light. Extraocular movements intact.  Sclerae nonicteric.  Mouth: Moist mucous membranes Neck: Supple, no thyromegaly, no lymphadenopathy, no jugular venous distention. Chest: Bibasilar  rales and bilateral wheezing. CV: Heart sounds are regular with an S1, S2. No murmurs, rubs or gallops.  Abdomen: Soft, nontender, nondistended with normal active bowel sounds. No palpable masses. Extremities: Extremities are without clubbing, or cyanosis.  Bilateral leg edema with mild tenderness. Pedal pulses 2+.  Skin: Warm and dry. No rashes, lesions or wounds Neuro: Alert and oriented times 3; grossly nonfocal.  Psych: Insight is good and judgment is appropriate. Mood and affect normal.   Data Review:    Labs: Basic Metabolic Panel: Recent Labs  Lab 09/29/21 1107  NA 137  K 5.2*  CL 101  CO2 26  GLUCOSE 99  BUN 36*  CREATININE 1.63*  CALCIUM 9.1   Liver Function Tests: No results for input(s): AST, ALT, ALKPHOS, BILITOT, PROT, ALBUMIN in the last 168 hours. No results for input(s): LIPASE, AMYLASE in the last 168 hours. No results for input(s): AMMONIA in the last 168 hours. CBC: Recent Labs  Lab 09/29/21 1107  WBC 10.3  HGB 9.7*  HCT 29.3*  MCV 98.3  PLT 292   Cardiac Enzymes: No results for input(s): CKTOTAL, CKMB, CKMBINDEX, TROPONINI in the last 168 hours.  BNP (last 3 results) No results for input(s): PROBNP in the last 8760 hours. CBG: No results for input(s): GLUCAP in the last 168 hours.  Urinalysis    Component Value Date/Time   COLORURINE YELLOW (A) 10/03/2020 1133   APPEARANCEUR CLEAR (A) 10/03/2020 1133   LABSPEC 1.017 10/03/2020 1133   PHURINE 5.0 10/03/2020 1133   GLUCOSEU NEGATIVE 10/03/2020 1133   HGBUR NEGATIVE 10/03/2020 1133   BILIRUBINUR NEGATIVE 10/03/2020 1133   KETONESUR NEGATIVE 10/03/2020 1133   PROTEINUR NEGATIVE 10/03/2020 1133   NITRITE NEGATIVE 10/03/2020 1133   LEUKOCYTESUR TRACE (A) 10/03/2020 1133      Radiographic Studies: DG Chest 2 View  Result Date: 09/29/2021 CLINICAL DATA:  Shortness of breath EXAM: CHEST - 2 VIEW COMPARISON:  09/23/2021 FINDINGS: Heart size is normal. Large hiatal hernia as seen  previously. Pulmonary venous hypertension without frank edema. No infiltrate, collapse or effusion. IMPRESSION: Pulmonary venous hypertension without frank edema. Hiatal hernia. Electronically Signed   By: Nelson Chimes M.D.   On: 09/29/2021 11:45    EKG: Independently reviewed by me showed normal sinus rhythm   Assessment/Plan:   Active Problems:   CKD (chronic kidney disease) stage 3, GFR 30-59 ml/min (HCC)   Dyspnea   Acute CHF (congestive heart failure) (HCC)   Acute hypoxemic respiratory failure (HCC)   Body mass index is 38.62 kg/m.  (Morbid obesity)   Acute CHF with peripheral edema: Admit to progressive cardiac unit.  She already got IV Lasix 40 mg in the ED.  Start IV Lasix infusion.  Monitor BMP, daily weight and urine output.  Obtain 2D echo for further evaluation.    Acute hypoxemic respiratory failure: Use 2 L/min oxygen via nasal cannula.  Taper off oxygen as able.  VQ scan has been ordered for further evaluation of dyspnea and hypoxemia.  CKD stage IIIb, hyperkalemia: Hold Aldactone.  Potassium level expected to improve with diuresis.  Monitor BMP.  Rheumatoid arthritis: Continue hydroxychloroquine  Hypertension: Hold Aldactone and enalapril-HCTZ for now   Other information:   DVT prophylaxis:   Heparin  Code Status: Full code. Family Communication: Plan discussed with Nira Conn, daughter at the bedside Disposition Plan: Possible discharge to home in 2 to 3 days Consults called: None Admission status: Inpatient  The medical decision making on this patient was of high complexity and the patient is at high risk for clinical deterioration, therefore this is a level 3 visit.    Beckey Polkowski [facility] Pager: Please check [email or web address]   How to contact the [facility] Attending or Consulting provider [place] or covering provider during after hours [place], for this patient?   Check the care team in [facility] and look for a) attending/consulting TR[facility] provider listed  and b) the [id number] team listed Log into [email or web address] and use [place]'s universal password to access. If you do not have the password, please contact the hospital operator. Locate the [facility] provider you are looking for under [facility] and page to a number that you can be directly reached. If you still have difficulty reaching the provider, please page the [facility] (Director on Call) for the Hospitalists listed on amion for assistance.  09/29/2021, 1:27 PM

## 2021-09-29 NOTE — ED Notes (Addendum)
Pt to ED from [facility] c/o SOB that has been going on for the past 3 months. Pt denies hx of COPD or respiratory illnesses. Upon assessment pt has increased WOB and tachypneic. Pt is also noted to have bilateral leg swelling, pt states she has noticed that for about a month. Pt denies wearing O2 at home.  VSS, A&Ox4

## 2021-09-29 NOTE — TOC Initial Note (Signed)
Transition of Care [facility]) - Initial/Assessment Note    Patient Details  Name: Tonya Cummings MRN: [id number] Date of Birth: [birth year]  Transition of Care [facility]) CM/SW Contact:    Ova Freshwater Phone Number: [phone] 09/29/2021, 4:11 PM  Clinical Narrative:                  Patient presents to [facility] from home due to increase SOB in the last three weeks. Patient experienced audible wheezing for the last two days. Patient currently on 2L [place]. Patient currently on 2L [place].  Patient is independent w/ ADLs. Main contact Dorene Ar (Spouse) [phone] (Mobile).  Expected Discharge Plan: [place] Barriers to Discharge: Continued Medical Work up   Patient Goals and CMS Choice        Expected Discharge Plan and Services Expected Discharge Plan: [place] In-house Referral: Clinical Social Work     Living arrangements for the past 2 months: [place]                                      Prior Living Arrangements/Services Living arrangements for the past 2 months: Single Family Home Lives with:: Spouse Dorene Ar (Spouse)   [phone] (Mobile))   Do you feel safe going back to the place where you live?: Yes      Need for Family Participation in Patient Care: Yes (Comment) Care giver support system in place?: Yes (comment)   Criminal Activity/Legal Involvement Pertinent to Current Situation/Hospitalization: No - Comment as needed  Activities of Daily Living      Permission Sought/Granted Permission sought to share information with : Family Supports Permission granted to share information with : Yes, Verbal Permission Granted  Share Information with NAME: Dorene Ar (Spouse)   [phone] (Mobile)           Emotional Assessment Appearance:: Appears stated age Attitude/Demeanor/Rapport: Engaged Affect (typically observed): Stable Orientation: : Oriented to Self, Oriented to Place, Oriented to   Time, Oriented to Situation Alcohol / Substance Use: Not Applicable Psych Involvement: No (comment)  Admission diagnosis:  Dyspnea [R06.00] Acute CHF (congestive heart failure) (HCC) [I50.9] Patient Active Problem List   Diagnosis Date Noted   Dyspnea 09/29/2021   Acute CHF (congestive heart failure) ([place]) 09/29/2021   Acute hypoxemic respiratory failure ([place]) 09/29/2021   Total knee replacement status 10/13/2020   CKD (chronic kidney disease) stage 3, GFR 30-59 ml/min (HCC) 10/12/2020   Hyperlipidemia 10/12/2020   Hypertension 10/12/2020   Migraine headache 10/12/2020   Restless legs syndrome (RLS) 10/12/2020   Severe obesity (BMI 35.0-39.9) with comorbidity ([place]) 06/30/2020   Primary osteoarthritis of right knee 03/27/2020   Iron deficiency anemia 09/12/2019   Low vitamin B12 level 09/12/2019   Normocytic anemia 09/07/2019   Right medial knee pain 08/09/2018   Depression with anxiety 03/22/2018   Sleep disorder 03/22/2018   Influenza A 02/17/2018   Community acquired pneumonia 02/17/2018   Chronic radicular pain of lower back 02/06/2018   Numbness and tingling of right leg 02/06/2018   Hip dysplasia 02/23/2016   Osteoporosis, post-menopausal 02/23/2016   Raynaud's disease without gangrene 02/23/2016   Seropositive rheumatoid arthritis ([place]) 02/23/2016   Lumbar facet joint pain 09/18/2014   PCP:  [name], NP Pharmacy:   [facility] [place], [place] [address] AT [address]  [facility] [address] Phone: [phone] Fax: [phone]     Social Determinants of Health (SDOH) Interventions    Readmission Risk Interventions No flowsheet data found.

## 2021-09-29 NOTE — ED Provider Notes (Signed)
[facility]  ____________________________________________   Event Date/Time   First MD Initiated Contact with Patient 09/29/21 1132     (approximate)  I have reviewed the triage vital signs and the nursing notes.   HISTORY  Chief Complaint Shortness of Breath    HPI Tonya Cummings is a 70 y.o. female with past medical history of CKD, depression, GERD, return arthritis who presents with dyspnea.  Symptoms started maybe about 2 to 3 weeks ago and have been coming progressively worse.  She endorses dyspnea both at rest and that is significantly worsened with any movement.  Has never had issues with breathing in the past.  No prior history of COPD or CHF.  She denies fevers chills cough.  Said occasionally she does have some chest tightness when her breathing gets bad but this is not consistent.  Also endorses increasing lower extremity edema.The patient denies hx of prior DVT/PE, unilateral leg pain/swelling, hormone use, recent surgery, hx of cancer, prolonged immobilization, or hemoptysis.           Past Medical History:  Diagnosis Date   Anxiety    Chronic kidney disease    Chronic pain    Chronic radicular pain of lower back    Costochondritis    Depression    Encounter for blood transfusion    GERD (gastroesophageal reflux disease)    Headache    migraines   Hip dysplasia    Hyperlipidemia    Hypertension    Iron deficiency anemia    Low back pain    Low vitamin B12 level    Obesity    Osteoporosis    Pelvic fracture (HCC)    Raynaud's disease without gangrene    Restless leg syndrome    Rheumatoid aortitis    Rheumatoid arthritis (HCC)    polyarthritis   Rheumatoid arthritis ([place])    Seasonal allergies    Sleep disorder    Thoracic compression fracture [facility])     Patient Active Problem List   Diagnosis Date Noted   Total knee replacement status 10/13/2020   CKD (chronic kidney disease) stage 3, GFR 30-59 ml/min ([place]) 10/12/2020    Hyperlipidemia 10/12/2020   Hypertension 10/12/2020   Migraine headache 10/12/2020   Restless legs syndrome (RLS) 10/12/2020   Severe obesity (BMI 35.0-39.9) with comorbidity ([place]) 06/30/2020   Primary osteoarthritis of right knee 03/27/2020   Iron deficiency anemia 09/12/2019   Low vitamin B12 level 09/12/2019   Normocytic anemia 09/07/2019   Right medial knee pain 08/09/2018   Depression with anxiety 03/22/2018   Sleep disorder 03/22/2018   Influenza A 02/17/2018   Community acquired pneumonia 02/17/2018   Chronic radicular pain of lower back 02/06/2018   Numbness and tingling of right leg 02/06/2018   Hip dysplasia 02/23/2016   Osteoporosis, post-menopausal 02/23/2016   Raynaud's disease without gangrene 02/23/2016   Seropositive rheumatoid arthritis ([place]) 02/23/2016   Lumbar facet joint pain 09/18/2014    Past Surgical History:  Procedure Laterality Date   ABDOMINAL SURGERY     pt denies   APPENDECTOMY     CARPAL TUNNEL RELEASE Bilateral    CHOLECYSTECTOMY     COLONOSCOPY WITH PROPOFOL N/A 08/15/2020   Procedure: COLONOSCOPY WITH PROPOFOL;  Surgeon: Robert Bellow, MD;  Location: [facility] ENDOSCOPY;  Service: Endoscopy;  Laterality: N/A;   DILATION AND CURETTAGE OF UTERUS     ESOPHAGOGASTRODUODENOSCOPY (EGD) WITH PROPOFOL N/A 08/15/2020   Procedure: ESOPHAGOGASTRODUODENOSCOPY (EGD) WITH PROPOFOL;  Surgeon: Hervey Ard  W, MD;  Location: [facility] ENDOSCOPY;  Service: Endoscopy;  Laterality: N/A;   FRACTURE SURGERY     hip fracture    FRACTURE SURGERY     pelvic fracture plate    HIP SURGERY Left    KNEE ARTHROPLASTY Right 10/13/2020   Procedure: COMPUTER ASSISTED TOTAL KNEE ARTHROPLASTY - RNFA;  Surgeon: Dereck Leep, MD;  Location: [facility] ORS;  Service: Orthopedics;  Laterality: Right;   TUBAL LIGATION      Prior to Admission medications   Medication Sig Start Date End Date Taking? Authorizing Provider  Acetaminophen-Caffeine (EXCEDRIN TENSION HEADACHE) 500-65 MG  TABS Take 1 tablet by mouth daily as needed (Headache).    [provider]  Adalimumab 40 MG/0.4ML PSKT Inject 40 mg into the skin every 14 (fourteen) days.  Patient not taking: Reported on 07/01/2021    [provider]  albuterol (VENTOLIN HFA) 108 (90 Base) MCG/ACT inhaler Inhale 2 puffs into the lungs every 4 (four) hours as needed. 09/23/21   Margarette Canada, NP  ASCORBIC ACID PO Take 1 tablet by mouth daily.     [provider]  calcitRIOL (ROCALTROL) 0.25 MCG capsule Take by mouth. 05/22/21   [provider]  Calcium Carb-Cholecalciferol 600-400 MG-UNIT CAPS Take 1 capsule by mouth 2 (two) times daily. 01/13/10   [provider]  Cholecalciferol 50 MCG (2000 UT) CAPS Take 2,000 Units by mouth daily. 09/23/09   [provider]  cyclobenzaprine (FLEXERIL) 10 MG tablet Take 10 mg by mouth at bedtime.    [provider]  enalapril-hydrochlorothiazide (VASERETIC) 10-25 MG per tablet Take 1 tablet by mouth daily.    [provider]  ferrous sulfate 325 (65 FE) MG tablet Take 325 mg by mouth daily with breakfast.    [provider]  FLUoxetine (PROZAC) 40 MG capsule Take 40 mg by mouth daily.    [provider]  folic acid (FOLVITE) 1 MG tablet Take 1 mg by mouth daily.    [provider]  hydroxychloroquine (PLAQUENIL) 200 MG tablet Take 1 tablet by mouth 2 (two) times daily. 06/22/21   [provider]  methotrexate (50 MG/ML) 1 g injection Inject 25 mg into the vein once a week. .6 ml    [provider]  mirtazapine (REMERON) 30 MG tablet  05/12/21   [provider]  Multiple Vitamins-Minerals (MULTIVITAMIN ADULT PO) Take 1 tablet by mouth daily.  09/10/08   [provider]  omeprazole (PRILOSEC) 40 MG capsule Take 40 mg by mouth 2 (two) times daily.  01/11/20   [provider]  pramipexole (MIRAPEX) 0.125 MG tablet Take 0.125 mg by mouth daily. Take 0.125 mg in the  morning and 2 at bedtime 02/15/18   [provider]  Propylene Glycol 0.6 % SOLN Apply to eye.    [provider]  RINVOQ 15 MG TB24 Take 1 tablet by mouth daily. 03/09/21   [provider]  rosuvastatin (CRESTOR) 5 MG tablet Take 5 mg by mouth every other day.  07/18/19   [provider]  spironolactone (ALDACTONE) 25 MG tablet  04/09/21   [provider]  triamcinolone ointment (KENALOG) 0.5 % Apply topically 2 (two) times daily. 04/08/21 04/08/22  [provider]  vitamin B-12 (CYANOCOBALAMIN) 500 MCG tablet Take 500 mcg by mouth daily.    [provider]    Allergies Gabapentin and Ibuprofen  Family History  Problem Relation Age of Onset   Diabetes Mother    Hypertension  Mother    Aneurysm Father    Diabetes Son    Seizures Son    Osteosarcoma Brother    Cancer Brother    Diabetes Brother    Diabetes Maternal Grandfather    Heart disease Maternal Grandfather     Social History Social History   Tobacco Use   Smoking status: Never   Smokeless tobacco: Never  Vaping Use   Vaping Use: Never used  Substance Use Topics   Alcohol use: No   Drug use: No    Review of Systems   Review of Systems  Constitutional:  Negative for chills and fever.  Respiratory:  Positive for chest tightness and shortness of breath. Negative for cough.   Cardiovascular:  Positive for leg swelling. Negative for palpitations.  Gastrointestinal:  Negative for abdominal pain, nausea and vomiting.  All other systems reviewed and are negative.  Physical Exam Updated Vital Signs BP (!) 149/69 (BP Location: Left Arm)   Pulse 95   Temp 98.4 F (36.9 C) (Oral)   Resp (!) 27   Ht 5\' 4"  (1.626 m)   Wt 102.1 kg   SpO2 97%   BMI 38.62 kg/m   Physical Exam Vitals and nursing note reviewed.  Constitutional:      General: She is not in acute distress.    Appearance: Normal appearance.  HENT:     Head: Normocephalic and atraumatic.  Eyes:      General: No scleral icterus.    Conjunctiva/sclera: Conjunctivae normal.  Cardiovascular:     Rate and Rhythm: Normal rate and regular rhythm.  Pulmonary:     Effort: Pulmonary effort is normal. No respiratory distress.     Breath sounds: No stridor.     Comments: Patient is tachypneic, able to speak in full sentences but frequently having to catch her breath Lungs are clear Abdominal:     General: Bowel sounds are normal.     Palpations: Abdomen is soft.  Musculoskeletal:        General: No deformity or signs of injury.     Cervical back: Normal range of motion.     Right lower leg: Edema present.     Left lower leg: Edema present.     Comments: 2+ lower extremity edema bilaterally, pitting  Skin:    General: Skin is dry.     Coloration: Skin is not jaundiced or pale.  Neurological:     General: No focal deficit present.     Mental Status: She is alert and oriented to person, place, and time. Mental status is at baseline.  Psychiatric:        Mood and Affect: Mood normal.        Behavior: Behavior normal.     LABS (all labs ordered are listed, but only abnormal results are displayed)  Labs Reviewed  BASIC METABOLIC PANEL - Abnormal; Notable for the following components:      Result Value   Potassium 5.2 (*)    BUN 36 (*)    Creatinine, Ser 1.63 (*)    GFR, Estimated 34 (*)    All other components within normal limits  CBC - Abnormal; Notable for the following components:   RBC 2.98 (*)    Hemoglobin 9.7 (*)    HCT 29.3 (*)    RDW 15.9 (*)    All other components within normal limits  TROPONIN I (HIGH SENSITIVITY) - Abnormal; Notable for the following components:   Troponin I (High Sensitivity) 75 (*)  All other components within normal limits  RESP PANEL BY RT-PCR (FLU A&B, COVID) ARPGX2  BRAIN NATRIURETIC PEPTIDE   ____________________________________________  EKG  NSR, nml axis, nml intervals, no acute ischemic  changes  ____________________________________________  RADIOLOGY I, Madelin Headings, personally viewed and evaluated these images (plain radiographs) as part of my medical decision making, as well as reviewing the written report by the radiologist.  ED MD interpretation:  I reviewed the CXR which does not show any acute cardiopulmonary process      ____________________________________________   PROCEDURES  Procedure(s) performed (including Critical Care):  Procedures   ____________________________________________   INITIAL IMPRESSION / ASSESSMENT AND PLAN / ED COURSE     Patient is a 70 year old female presents with progressive dyspnea on exertion and at rest.  On arrival she is tachypneic and does appear so on exam however she is saturating okay.  She has increasing lower extremity edema dyspnea on exertion as well as some associated chest tightness when her breathing gets bad.  Labs here are notable for an elevated troponin to 75, her BNP is normal, she is chronically anemic to about 9 and this is stable.  Renal function is at his baseline.  Mildly hyperkalemic to 5.2.  Her EKG does not show any ischemic changes.  Patient's chest x-ray overall is reassuring, is read as radiology as potentially some pulmonary venous congestion without overt edema.  Patient looks volume overloaded to me on exam, she has pitting edema bilaterally and she does look dyspneic but lungs are clear.  Suspect either new onset systolic heart failure versus pulmonary hypertension.  Consider pulmonary embolism but given this is been going on for several weeks and getting progressively worse feeling this is less likely.  Given her exam and elevated troponin will admit for further work-up.  We will give a dose of Lasix and       ____________________________________________   FINAL CLINICAL IMPRESSION(S) / ED DIAGNOSES  Final diagnoses:  Dyspnea, unspecified type     ED Discharge Orders     None         Note:  This document was prepared using Dragon voice recognition software and may include unintentional dictation errors.    Rada Hay, MD 09/29/21 [phone]

## 2021-09-29 NOTE — ED Notes (Signed)
Admitting at bedside 

## 2021-09-29 NOTE — ED Triage Notes (Addendum)
Patient to ER for c/o shortness of breath for approx 3 weeks, but has gotten much worse in last week and a half. Patient states she wonders if it is related to her arthritis medication (unknown med), but has been on it for approx 5 months without issue. Patient unable to speak in complete sentences with ease, but able to complete sentences. +Audible wheezing. Patient states wheezing began 2 days ago. Denies any history of lung condition. +Swelling in legs per patient.

## 2021-09-29 NOTE — ED Notes (Signed)
Pt taken to bathroom via wc, after transferring in room pt has labored breathing unable to speak in complete sentences, O2 87% on RA, this RN put on 2L [place]. Pt now at 96%.

## 2021-09-30 ENCOUNTER — Inpatient Hospital Stay
Admit: 2021-09-30 | Discharge: 2021-09-30 | Disposition: A | Discharge status: Home / Self Care | Payer: Medicare HMO | Attending: Internal Medicine | Admitting: Internal Medicine

## 2021-09-30 DIAGNOSIS — I5031 Acute diastolic (congestive) heart failure: Secondary | ICD-10-CM | POA: Diagnosis not present

## 2021-09-30 DIAGNOSIS — N1832 Chronic kidney disease, stage 3b: Secondary | ICD-10-CM | POA: Diagnosis not present

## 2021-09-30 DIAGNOSIS — J9601 Acute respiratory failure with hypoxia: Secondary | ICD-10-CM | POA: Diagnosis not present

## 2021-09-30 LAB — ECHOCARDIOGRAM COMPLETE
AR max vel: 1.55 cm2
AV Area VTI: 1.49 cm2
AV Area mean vel: 1.49 cm2
AV Mean grad: 4 mmHg
AV Peak grad: 8 mmHg
Ao pk vel: 1.41 m/s
Area-P 1/2: 6.6 cm2
Height: 64 in
MV VTI: 1.29 cm2
S' Lateral: 2.75 cm
Weight: 3600 oz

## 2021-09-30 LAB — BASIC METABOLIC PANEL
Anion gap: 10 (ref 5–15)
BUN: 36 mg/dL — ABNORMAL HIGH (ref 8–23)
CO2: 29 mmol/L (ref 22–32)
Calcium: 9 mg/dL (ref 8.9–10.3)
Chloride: 98 mmol/L (ref 98–111)
Creatinine, Ser: 1.82 mg/dL — ABNORMAL HIGH (ref 0.44–1.00)
GFR, Estimated: 30 mL/min — ABNORMAL LOW (ref >60)
Glucose, Bld: 100 mg/dL — ABNORMAL HIGH (ref 70–99)
Potassium: 4.9 mmol/L (ref 3.5–5.1)
Sodium: 137 mmol/L (ref 135–145)

## 2021-09-30 LAB — HIV ANTIBODY (ROUTINE TESTING W REFLEX): HIV Screen 4th Generation wRfx: NONREACTIVE

## 2021-09-30 MED ORDER — PERFLUTREN LIPID MICROSPHERE
1.0000 mL | INTRAVENOUS | Status: AC | PRN
  Administered 2021-09-30: 2 mL via INTRAVENOUS
  Filled 2021-09-30: qty 10

## 2021-09-30 NOTE — Progress Notes (Signed)
PROGRESS NOTE    Tonya Cummings  UXL:[id number] DOB: 1951-01-14 DOA: 09/29/2021 PCP: [name], NP    Assessment & Plan:   Active Problems:   CKD (chronic kidney disease) stage 3, GFR 30-59 ml/min (HCC)   Dyspnea   Acute CHF (congestive heart failure) (HCC)   Acute hypoxemic respiratory failure (HCC)   Acute diastolic CHF:  with peripheral edema & shortness of breath. Echo shows EF 53-66%, grade I diastolic dysfunction & no regional wall abnormalities. Continue on IV lasix. Monitor I/Os.    Acute hypoxic respiratory failure: VQ scan shows no perfusion defects to suggest PE. Continue on supplemental oxygen and wean as tolerated    CKDIIIb: hold aldactone, enalapril, HCTZ. Cr is trending up from day prior   Hyperkalemia: resolved  Rheumatoid arthritis: continue hydroxychloroquine  HTN: continue to hold Aldactone and enalapril-HCTZ. BP is WNL    DVT prophylaxis: heparin  Code Status: full  Family Communication:  Disposition Plan:  depends on PT/OT recs   Level of care: Progressive Cardiac  Status is: Inpatient  Remains inpatient appropriate because: severity of illness      Consultants:    Procedures:   Antimicrobials:    Subjective: Pt c/o shortness of breath   Objective: Vitals:   09/30/21 0115 09/30/21 0120 09/30/21 0545 09/30/21 0800  BP:  (!) 143/72 137/74 119/73  Pulse: 100 92 84 90  Resp:  20 18 (!) 25  Temp:      TempSrc:      SpO2: 99% 98% 97% 96%  Weight:      Height:        Intake/Output Summary (Last 24 hours) at 09/30/2021 0924 Last data filed at 09/30/2021 0700 Gross per 24 hour  Intake --  Output 2000 ml  Net -2000 ml   Filed Weights   09/29/21 1058  Weight: 102.1 kg    Examination:  General exam: Appears calm and comfortable  Respiratory system: Clear to auscultation. Respiratory effort normal. Cardiovascular system: S1 & S2 +. No  rubs, gallops or clicks. B/l LE edema Gastrointestinal system: Abdomen is  nondistended, soft and nontender. Normal bowel sounds heard. Central nervous system: Alert and oriented. Moves all extremities  Psychiatry: Judgement and insight appear normal. Flat mood and affect     Data Reviewed: I have personally reviewed following labs and imaging studies  CBC: Recent Labs  Lab 09/29/21 1107  WBC 10.3  HGB 9.7*  HCT 29.3*  MCV 98.3  PLT 440   Basic Metabolic Panel: Recent Labs  Lab 09/29/21 1107 09/30/21 0634  NA 137 137  K 5.2* 4.9  CL 101 98  CO2 26 29  GLUCOSE 99 100*  BUN 36* 36*  CREATININE 1.63* 1.82*  CALCIUM 9.1 9.0   GFR: Estimated Creatinine Clearance: 34.4 mL/min (A) (by C-G formula based on SCr of 1.82 mg/dL (H)). Liver Function Tests: No results for input(s): AST, ALT, ALKPHOS, BILITOT, PROT, ALBUMIN in the last 168 hours. No results for input(s): LIPASE, AMYLASE in the last 168 hours. No results for input(s): AMMONIA in the last 168 hours. Coagulation Profile: No results for input(s): INR, PROTIME in the last 168 hours. Cardiac Enzymes: No results for input(s): CKTOTAL, CKMB, CKMBINDEX, TROPONINI in the last 168 hours. BNP (last 3 results) No results for input(s): PROBNP in the last 8760 hours. HbA1C: No results for input(s): HGBA1C in the last 72 hours. CBG: No results for input(s): GLUCAP in the last 168 hours. Lipid Profile: No results for input(s): CHOL,  HDL, LDLCALC, TRIG, CHOLHDL, LDLDIRECT in the last 72 hours. Thyroid Function Tests: No results for input(s): TSH, T4TOTAL, FREET4, T3FREE, THYROIDAB in the last 72 hours. Anemia Panel: No results for input(s): VITAMINB12, FOLATE, FERRITIN, TIBC, IRON, RETICCTPCT in the last 72 hours. Sepsis Labs: No results for input(s): PROCALCITON, LATICACIDVEN in the last 168 hours.  Recent Results (from the past 240 hour(s))  Resp Panel by RT-PCR (Flu A&B, Covid) Nasopharyngeal Swab     Status: None   Collection Time: 09/29/21 12:27 PM   Specimen: Nasopharyngeal Swab;  Nasopharyngeal(NP) swabs in vial transport medium  Result Value Ref Range Status   SARS Coronavirus 2 by RT PCR NEGATIVE NEGATIVE Final    Comment: (NOTE) SARS-CoV-2 target nucleic acids are NOT DETECTED.  The SARS-CoV-2 RNA is generally detectable in upper respiratory specimens during the acute phase of infection. The lowest concentration of SARS-CoV-2 viral copies this assay can detect is 138 copies/mL. A negative result does not preclude SARS-Cov-2 infection and should not be used as the sole basis for treatment or other patient management decisions. A negative result may occur with  improper specimen collection/handling, submission of specimen other than nasopharyngeal swab, presence of viral mutation(s) within the areas targeted by this assay, and inadequate number of viral copies(<138 copies/mL). A negative result must be combined with clinical observations, patient history, and epidemiological information. The expected result is Negative.  Fact Sheet for Patients:  EntrepreneurPulse.com.au  Fact Sheet for Healthcare Providers:  IncredibleEmployment.be  This test is no t yet approved or cleared by the [place] FDA and  has been authorized for detection and/or diagnosis of SARS-CoV-2 by FDA under an Emergency Use Authorization (EUA). This EUA will remain  in effect (meaning this test can be used) for the duration of the COVID-19 declaration under Section 564(b)(1) of the Act, 21 U.S.C.section 360bbb-3(b)(1), unless the authorization is terminated  or revoked sooner.       Influenza A by PCR NEGATIVE NEGATIVE Final   Influenza B by PCR NEGATIVE NEGATIVE Final    Comment: (NOTE) The Xpert Xpress SARS-CoV-2/FLU/RSV plus assay is intended as an aid in the diagnosis of influenza from Nasopharyngeal swab specimens and should not be used as a sole basis for treatment. Nasal washings and aspirates are unacceptable for Xpert Xpress  SARS-CoV-2/FLU/RSV testing.  Fact Sheet for Patients: EntrepreneurPulse.com.au  Fact Sheet for Healthcare Providers: IncredibleEmployment.be  This test is not yet approved or cleared by the [place] FDA and has been authorized for detection and/or diagnosis of SARS-CoV-2 by FDA under an Emergency Use Authorization (EUA). This EUA will remain in effect (meaning this test can be used) for the duration of the COVID-19 declaration under Section 564(b)(1) of the Act, 21 U.S.C. section 360bbb-3(b)(1), unless the authorization is terminated or revoked.  Performed at [facility], [address] [place], [place] [address]          Radiology Studies: DG Chest 2 View  Result Date: 09/29/2021 CLINICAL DATA:  Shortness of breath EXAM: CHEST - 2 VIEW COMPARISON:  09/23/2021 FINDINGS: Heart size is normal. Large hiatal hernia as seen previously. Pulmonary venous hypertension without frank edema. No infiltrate, collapse or effusion. IMPRESSION: Pulmonary venous hypertension without frank edema. Hiatal hernia. Electronically Signed   By: Nelson Chimes M.D.   On: 09/29/2021 11:45   NM Pulmonary Perfusion  Result Date: 09/29/2021 CLINICAL DATA:  Respiratory failure.  Lower extremity edema. EXAM: NUCLEAR MEDICINE PERFUSION LUNG SCAN TECHNIQUE: Perfusion images were obtained in multiple projections after  intravenous injection of radiopharmaceutical. Ventilation scans intentionally deferred if perfusion scan and chest x-ray adequate for interpretation during COVID 19 epidemic. RADIOPHARMACEUTICALS:  4.33 mCi Tc-5m MAA IV COMPARISON:  Chest radiography same day FINDINGS: Pulmonary perfusion imaging is normal. No finding to suggest pulmonary emboli. IMPRESSION: No perfusion defects to suggest pulmonary emboli. Electronically Signed   By: Nelson Chimes M.D.   On: 09/29/2021 15:33        Scheduled Meds:  heparin  5,000 Units Subcutaneous Q8H    hydroxychloroquine  200 mg Oral BID   sodium chloride flush  3 mL Intravenous Q12H   Continuous Infusions:  sodium chloride     furosemide (LASIX) 200 mg in dextrose 5% 100 mL (2mg /mL) infusion 8 mg/hr (09/29/21 1516)     LOS: 1 day    Time spent: 31 mins     Wyvonnia Dusky, MD [facility] Pager [phone]  If 7PM-7AM, please contact night-coverage 09/30/2021, 9:24 AM

## 2021-09-30 NOTE — Progress Notes (Signed)
*  PRELIMINARY RESULTS* Echocardiogram 2D Echocardiogram has been performed.  Tonya Cummings 09/30/2021, 9:37 AM

## 2021-10-01 DIAGNOSIS — J9601 Acute respiratory failure with hypoxia: Secondary | ICD-10-CM | POA: Diagnosis not present

## 2021-10-01 DIAGNOSIS — N179 Acute kidney failure, unspecified: Secondary | ICD-10-CM

## 2021-10-01 DIAGNOSIS — I5031 Acute diastolic (congestive) heart failure: Secondary | ICD-10-CM | POA: Diagnosis not present

## 2021-10-01 LAB — BASIC METABOLIC PANEL
Anion gap: 14 (ref 5–15)
BUN: 50 mg/dL — ABNORMAL HIGH (ref 8–23)
CO2: 30 mmol/L (ref 22–32)
Calcium: 8.7 mg/dL — ABNORMAL LOW (ref 8.9–10.3)
Chloride: 92 mmol/L — ABNORMAL LOW (ref 98–111)
Creatinine, Ser: 2.42 mg/dL — ABNORMAL HIGH (ref 0.44–1.00)
GFR, Estimated: 21 mL/min — ABNORMAL LOW (ref >60)
Glucose, Bld: 165 mg/dL — ABNORMAL HIGH (ref 70–99)
Potassium: 4 mmol/L (ref 3.5–5.1)
Sodium: 136 mmol/L (ref 135–145)

## 2021-10-01 LAB — CBC
HCT: 31.2 % — ABNORMAL LOW (ref 36.0–46.0)
Hemoglobin: 10.2 g/dL — ABNORMAL LOW (ref 12.0–15.0)
MCH: 31.2 pg (ref 26.0–34.0)
MCHC: 32.7 g/dL (ref 30.0–36.0)
MCV: 95.4 fL (ref 80.0–100.0)
Platelets: 320 10*3/uL (ref 150–400)
RBC: 3.27 MIL/uL — ABNORMAL LOW (ref 3.87–5.11)
RDW: 15.7 % — ABNORMAL HIGH (ref 11.5–15.5)
WBC: 11.4 10*3/uL — ABNORMAL HIGH (ref 4.0–10.5)
nRBC: 0 % (ref 0.0–0.2)

## 2021-10-01 MED ORDER — PRAMIPEXOLE DIHYDROCHLORIDE 0.25 MG PO TABS
0.1250 mg | ORAL_TABLET | Freq: Every day | ORAL | Status: DC
  Administered 2021-10-01 – 2021-10-02 (×2): 0.125 mg via ORAL
  Filled 2021-10-01 (×3): qty 0.5

## 2021-10-01 MED ORDER — MIDAZOLAM HCL 5 MG/5ML IJ SOLN
INTRAMUSCULAR | Status: AC
  Filled 2021-10-01: qty 5

## 2021-10-01 MED ORDER — FENTANYL CITRATE PF 50 MCG/ML IJ SOSY
PREFILLED_SYRINGE | INTRAMUSCULAR | Status: AC
  Filled 2021-10-01: qty 1

## 2021-10-01 MED ORDER — PRAMIPEXOLE DIHYDROCHLORIDE 0.25 MG PO TABS
0.2500 mg | ORAL_TABLET | Freq: Every day | ORAL | Status: DC
  Administered 2021-10-01: 0.25 mg via ORAL
  Filled 2021-10-01 (×3): qty 1

## 2021-10-01 MED ORDER — HEPARIN SODIUM (PORCINE) 1000 UNIT/ML IJ SOLN
INTRAMUSCULAR | Status: AC
  Filled 2021-10-01: qty 1

## 2021-10-01 NOTE — Evaluation (Signed)
Physical Therapy Evaluation Patient Details Name: Tonya Cummings MRN: [id number] DOB: May 31, 1951 Today's Date: 10/01/2021  History of Present Illness  Tonya Cummings is a 70 y.o. female with medical history significant for rheumatoid arthritis, CKD stage IIIb, depression,, anxiety, chronic low back pain, GERD, who presented to the hospital because of increasing shortness of breath. Lower extremity swelling has gotten worse.  She admits to orthopnea and says she sleeps in a recliner.  PT admitted for Acute diasstolic CHG and acute hypoxic respiratory failure. Imaging negative for Pulmonary embolism.   Clinical Impression  Pt is a pleasant 70 year old female who presents to PT evaluation after coming to ED for worsening shortness of breath and B LE edema. Pt reports that at baseline she uses a [facility] for ambulation of household and community distances and mod I for all ADLs and IADLs. Upon evaluation, pt demonstrating decreased B LE edema and able to perform bed mobility and transfers independently. Pt ambulated without AD for 50 ft + CGA and oxygen saturation >91%. Pt appears to be at her baseline function and demonstrating improvements of her shortness of breath. Recommending pt discharge home with support of spouse and no further PT needed. Will complete orders at this time.      Recommendations for follow up therapy are one component of a multi-disciplinary discharge planning process, led by the attending physician.  Recommendations may be updated based on patient status, additional functional criteria and insurance authorization.  Follow Up Recommendations No PT follow up    Equipment Recommendations  None recommended by PT    Recommendations for Other Services       Precautions / Restrictions Precautions Precautions: None Restrictions Weight Bearing Restrictions: No      Mobility  Bed Mobility Overal bed mobility: Independent             General bed mobility comments:  No physical assistance required    Transfers Overall transfer level: Independent Equipment used: None             General transfer comment: Able to stand w ithout AD and stabilize independently  Ambulation/Gait Ambulation/Gait assistance: Min guard Gait Distance (Feet): 50 Feet Assistive device: None;Rolling walker (2 wheeled) Gait Pattern/deviations: Step-through pattern;Decreased stride length;Narrow base of support     General Gait Details: Increased lateral sway during ambulation without RW but no loss of balance noted  Stairs            Wheelchair Mobility    Modified Rankin (Stroke Patients Only)       Balance Overall balance assessment: No apparent balance deficits (not formally assessed)                                           Pertinent Vitals/Pain Pain Assessment: No/denies pain    Home Living Family/patient expects to be discharged to:: Private residence Living Arrangements: Spouse/significant other Available Help at Discharge: Family;Available 24 hours/day Type of Home: House Home Access: Stairs to enter Entrance Stairs-Rails: Right;Left;Can reach both Entrance Stairs-Number of Steps: 3 Home Layout: One level Home Equipment: Shower seat;Cane - single point Additional Comments: Pt reports living with spouse and is able to ambulate household and community distances with [facility]    Prior Function Level of Independence: Independent with assistive device(s)         Comments: Usage of SPC at baseline     Hand  Dominance        Extremity/Trunk Assessment   Upper Extremity Assessment Upper Extremity Assessment: Overall WFL for tasks assessed    Lower Extremity Assessment Lower Extremity Assessment: Overall WFL for tasks assessed       Communication   Communication: No difficulties  Cognition Arousal/Alertness: Awake/alert Behavior During Therapy: WFL for tasks assessed/performed Overall Cognitive Status: Within  Functional Limits for tasks assessed                                        General Comments General comments (skin integrity, edema, etc.): Pt oxygen montiored throughout: On room air during ambulation with RW oxygen down to 86%, ambulation without AD SpO2 remained >91%    Exercises     Assessment/Plan    PT Assessment Patent does not need any further PT services  PT Problem List         PT Treatment Interventions      PT Goals (Current goals can be found in the Care Plan section)  Acute Rehab PT Goals Patient Stated Goal: to go home PT Goal Formulation: With patient Time For Goal Achievement: 10/01/21 Potential to Achieve Goals: Good    Frequency     Barriers to discharge        Co-evaluation               AM-PAC PT "6 Clicks" Mobility  Outcome Measure Help needed turning from your back to your side while in a flat bed without using bedrails?: None Help needed moving from lying on your back to sitting on the side of a flat bed without using bedrails?: None Help needed moving to and from a bed to a chair (including a wheelchair)?: None Help needed standing up from a chair using your arms (e.g., wheelchair or bedside chair)?: None Help needed to walk in hospital room?: A Little Help needed climbing 3-5 steps with a railing? : A Little 6 Click Score: 22    End of Session Equipment Utilized During Treatment: Gait belt       PT Visit Diagnosis: Muscle weakness (generalized) (M62.81)    Time: 0973-5329 PT Time Calculation (min) (ACUTE ONLY): 20 min   Charges:   PT Evaluation $PT Eval Low Complexity: 1 Low PT Treatments $Gait Training: 8-22 mins        Andrey Campanile, SPT   [place] 10/01/2021, 3:05 PM

## 2021-10-01 NOTE — Progress Notes (Signed)
Patient weaned to room air. O2 sats >90% at rest.

## 2021-10-01 NOTE — Progress Notes (Signed)
PROGRESS NOTE    Tonya Cummings  TSV:[id number] DOB: 06/27/1951 DOA: 09/29/2021 PCP: [name], NP    Assessment & Plan:   Active Problems:   CKD (chronic kidney disease) stage 3, GFR 30-59 ml/min (HCC)   Dyspnea   Acute CHF (congestive heart failure) (HCC)   Acute hypoxemic respiratory failure (HCC)   Acute diastolic CHF:  with peripheral edema & shortness of breath. Improved shortness of breath today.  Echo shows EF 92-33%, grade I diastolic dysfunction & no regional wall abnormalities. D/c IV lasix drip as Cr is trending up.    Acute hypoxic respiratory failure: VQ scan shows no perfusion defects to suggest PE. Continue on supplemental oxygen and wean as tolerated   AKI on CKDIIIb: Cr is trending up again today. Continue to hold aldactone, enalapril & HCTZ.   Hyperkalemia: resolved  Rheumatoid arthritis: continue on hydroxychloroquine   HTN: continue to hold aldactone and enalapril-HCTZ. BP is WNL   RLS: restarted home dose of pramipexole    DVT prophylaxis: heparin  Code Status: full  Family Communication:  Disposition Plan:  depends on PT/OT recs   Level of care: Progressive Cardiac  Status is: Inpatient  Remains inpatient appropriate because: severity of illness      Consultants:    Procedures:   Antimicrobials:    Subjective: Pt c/o malaise   Objective: Vitals:   10/01/21 0000 10/01/21 0415 10/01/21 0630 10/01/21 0700  BP: (!) 117/58 (!) 148/84 125/70 137/88  Pulse: 95 91 90 86  Resp: (!) 27 (!) 21 15 (!) 23  Temp:      TempSrc:      SpO2: 99% 99% 99% 97%  Weight:      Height:        Intake/Output Summary (Last 24 hours) at 10/01/2021 0904 Last data filed at 10/01/2021 0507 Gross per 24 hour  Intake 151.19 ml  Output 700 ml  Net -548.81 ml   Filed Weights   09/29/21 1058  Weight: 102.1 kg    Examination:  General exam: Appears comfortable   Respiratory system: diminished breath sounds b/l  Cardiovascular  system: S1/S2+. No rubs or clicks  Gastrointestinal system: Abd is soft, NT, ND & hypoactive bowel sounds Central nervous system: Alert and oriented. Moves all extremities  Psychiatry: Judgement and insight appear normal. Flat mood and affect    Data Reviewed: I have personally reviewed following labs and imaging studies  CBC: Recent Labs  Lab 09/29/21 1107 10/01/21 0608  WBC 10.3 11.4*  HGB 9.7* 10.2*  HCT 29.3* 31.2*  MCV 98.3 95.4  PLT 292 007   Basic Metabolic Panel: Recent Labs  Lab 09/29/21 1107 09/30/21 0634 10/01/21 0608  NA 137 137 136  K 5.2* 4.9 4.0  CL 101 98 92*  CO2 26 29 30   GLUCOSE 99 100* 165*  BUN 36* 36* 50*  CREATININE 1.63* 1.82* 2.42*  CALCIUM 9.1 9.0 8.7*   GFR: Estimated Creatinine Clearance: 25.9 mL/min (A) (by C-G formula based on SCr of 2.42 mg/dL (H)). Liver Function Tests: No results for input(s): AST, ALT, ALKPHOS, BILITOT, PROT, ALBUMIN in the last 168 hours. No results for input(s): LIPASE, AMYLASE in the last 168 hours. No results for input(s): AMMONIA in the last 168 hours. Coagulation Profile: No results for input(s): INR, PROTIME in the last 168 hours. Cardiac Enzymes: No results for input(s): CKTOTAL, CKMB, CKMBINDEX, TROPONINI in the last 168 hours. BNP (last 3 results) No results for input(s): PROBNP in the last 8760  hours. HbA1C: No results for input(s): HGBA1C in the last 72 hours. CBG: No results for input(s): GLUCAP in the last 168 hours. Lipid Profile: No results for input(s): CHOL, HDL, LDLCALC, TRIG, CHOLHDL, LDLDIRECT in the last 72 hours. Thyroid Function Tests: No results for input(s): TSH, T4TOTAL, FREET4, T3FREE, THYROIDAB in the last 72 hours. Anemia Panel: No results for input(s): VITAMINB12, FOLATE, FERRITIN, TIBC, IRON, RETICCTPCT in the last 72 hours. Sepsis Labs: No results for input(s): PROCALCITON, LATICACIDVEN in the last 168 hours.  Recent Results (from the past 240 hour(s))  Resp Panel by RT-PCR  (Flu A&B, Covid) Nasopharyngeal Swab     Status: None   Collection Time: 09/29/21 12:27 PM   Specimen: Nasopharyngeal Swab; Nasopharyngeal(NP) swabs in vial transport medium  Result Value Ref Range Status   SARS Coronavirus 2 by RT PCR NEGATIVE NEGATIVE Final    Comment: (NOTE) SARS-CoV-2 target nucleic acids are NOT DETECTED.  The SARS-CoV-2 RNA is generally detectable in upper respiratory specimens during the acute phase of infection. The lowest concentration of SARS-CoV-2 viral copies this assay can detect is 138 copies/mL. A negative result does not preclude SARS-Cov-2 infection and should not be used as the sole basis for treatment or other patient management decisions. A negative result may occur with  improper specimen collection/handling, submission of specimen other than nasopharyngeal swab, presence of viral mutation(s) within the areas targeted by this assay, and inadequate number of viral copies(<138 copies/mL). A negative result must be combined with clinical observations, patient history, and epidemiological information. The expected result is Negative.  Fact Sheet for Patients:  EntrepreneurPulse.com.au  Fact Sheet for Healthcare Providers:  IncredibleEmployment.be  This test is no t yet approved or cleared by the [place] FDA and  has been authorized for detection and/or diagnosis of SARS-CoV-2 by FDA under an Emergency Use Authorization (EUA). This EUA will remain  in effect (meaning this test can be used) for the duration of the COVID-19 declaration under Section 564(b)(1) of the Act, 21 U.S.C.section 360bbb-3(b)(1), unless the authorization is terminated  or revoked sooner.       Influenza A by PCR NEGATIVE NEGATIVE Final   Influenza B by PCR NEGATIVE NEGATIVE Final    Comment: (NOTE) The Xpert Xpress SARS-CoV-2/FLU/RSV plus assay is intended as an aid in the diagnosis of influenza from Nasopharyngeal swab specimens  and should not be used as a sole basis for treatment. Nasal washings and aspirates are unacceptable for Xpert Xpress SARS-CoV-2/FLU/RSV testing.  Fact Sheet for Patients: EntrepreneurPulse.com.au  Fact Sheet for Healthcare Providers: IncredibleEmployment.be  This test is not yet approved or cleared by the [place] FDA and has been authorized for detection and/or diagnosis of SARS-CoV-2 by FDA under an Emergency Use Authorization (EUA). This EUA will remain in effect (meaning this test can be used) for the duration of the COVID-19 declaration under Section 564(b)(1) of the Act, 21 U.S.C. section 360bbb-3(b)(1), unless the authorization is terminated or revoked.  Performed at [facility], [address], [place], [place] [address]          Radiology Studies: DG Chest 2 View  Result Date: 09/29/2021 CLINICAL DATA:  Shortness of breath EXAM: CHEST - 2 VIEW COMPARISON:  09/23/2021 FINDINGS: Heart size is normal. Large hiatal hernia as seen previously. Pulmonary venous hypertension without frank edema. No infiltrate, collapse or effusion. IMPRESSION: Pulmonary venous hypertension without frank edema. Hiatal hernia. Electronically Signed   By: Nelson Chimes M.D.   On: 09/29/2021 11:45   NM  Pulmonary Perfusion  Result Date: 09/29/2021 CLINICAL DATA:  Respiratory failure.  Lower extremity edema. EXAM: NUCLEAR MEDICINE PERFUSION LUNG SCAN TECHNIQUE: Perfusion images were obtained in multiple projections after intravenous injection of radiopharmaceutical. Ventilation scans intentionally deferred if perfusion scan and chest x-ray adequate for interpretation during COVID 19 epidemic. RADIOPHARMACEUTICALS:  4.33 mCi Tc-81m MAA IV COMPARISON:  Chest radiography same day FINDINGS: Pulmonary perfusion imaging is normal. No finding to suggest pulmonary emboli. IMPRESSION: No perfusion defects to suggest pulmonary emboli. Electronically Signed   By:  Nelson Chimes M.D.   On: 09/29/2021 15:33   ECHOCARDIOGRAM COMPLETE  Result Date: 09/30/2021    ECHOCARDIOGRAM REPORT   Patient Name:   PIETRA ZULUAGA Date of Exam: 09/30/2021 Medical Rec #:  [id number]         Height:       64.0 in Accession #:    [id number]        Weight:       225.0 lb Date of Birth:  29-May-1951        BSA:          2.057 m Patient Age:    70 years          BP:           137/74 mmHg Patient Gender: F                 HR:           91 bpm. Exam Location:  [facility] Procedure: 2D Echo, Color Doppler, Cardiac Doppler and Intracardiac            Opacification Agent Indications:     R06.00 Dyspnea  History:         Patient has no prior history of Echocardiogram examinations.                  CKD, Signs/Symptoms:Edema and Shoulder pain; Risk                  Factors:Hypertension and Dyslipidemia.  Sonographer:     Charmayne Sheer Referring Phys:  [place] Diagnosing Phys: Serafina Royals MD  Sonographer Comments: Suboptimal apical window and no subcostal window. IMPRESSIONS  1. Left ventricular ejection fraction, by estimation, is 55 to 60%. The left ventricle has normal function. The left ventricle has no regional wall motion abnormalities. Left ventricular diastolic parameters are consistent with Grade I diastolic dysfunction (impaired relaxation).  2. Right ventricular systolic function is normal. The right ventricular size is normal.  3. The mitral valve is normal in structure. Mild mitral valve regurgitation.  4. The aortic valve is normal in structure. Aortic valve regurgitation is not visualized. FINDINGS  Left Ventricle: Left ventricular ejection fraction, by estimation, is 55 to 60%. The left ventricle has normal function. The left ventricle has no regional wall motion abnormalities. Definity contrast agent was given IV to delineate the left ventricular  endocardial borders. The left ventricular internal cavity size was normal in size. There is no left ventricular hypertrophy. Left  ventricular diastolic parameters are consistent with Grade I diastolic dysfunction (impaired relaxation). Right Ventricle: The right ventricular size is normal. No increase in right ventricular wall thickness. Right ventricular systolic function is normal. Left Atrium: Left atrial size was normal in size. Right Atrium: Right atrial size was normal in size. Pericardium: There is no evidence of pericardial effusion. Mitral Valve: The mitral valve is normal in structure. Mild mitral valve regurgitation. MV peak gradient, 9.5 mmHg. The mean  mitral valve gradient is 4.0 mmHg. Tricuspid Valve: The tricuspid valve is normal in structure. Tricuspid valve regurgitation is mild. Aortic Valve: The aortic valve is normal in structure. Aortic valve regurgitation is not visualized. Aortic valve mean gradient measures 4.0 mmHg. Aortic valve peak gradient measures 8.0 mmHg. Aortic valve area, by VTI measures 1.49 cm. Pulmonic Valve: The pulmonic valve was normal in structure. Pulmonic valve regurgitation is trivial. Aorta: The aortic root and ascending aorta are structurally normal, with no evidence of dilitation. IAS/Shunts: No atrial level shunt detected by color flow Doppler.  LEFT VENTRICLE PLAX 2D LVIDd:         4.45 cm   Diastology LVIDs:         2.75 cm   LV e' medial:    5.66 cm/s LV PW:         1.15 cm   LV E/e' medial:  15.5 LV IVS:        0.90 cm   LV e' lateral:   5.22 cm/s LVOT diam:     1.90 cm   LV E/e' lateral: 16.8 LV SV:         35 LV SV Index:   17 LVOT Area:     2.84 cm  RIGHT VENTRICLE RV Basal diam:  4.80 cm LEFT ATRIUM           Index        RIGHT ATRIUM           Index LA diam:      3.30 cm 1.60 cm/m   RA Area:     14.10 cm LA Vol (A4C): 93.2 ml 45.31 ml/m  RA Volume:   32.70 ml  15.90 ml/m  AORTIC VALVE                    PULMONIC VALVE AV Area (Vmax):    1.55 cm     PV Vmax:       1.36 m/s AV Area (Vmean):   1.49 cm     PV Vmean:      107.000 cm/s AV Area (VTI):     1.49 cm     PV VTI:         0.268 m AV Vmax:           141.00 cm/s  PV Peak grad:  7.4 mmHg AV Vmean:          95.900 cm/s  PV Mean grad:  5.0 mmHg AV VTI:            0.238 m AV Peak Grad:      8.0 mmHg AV Mean Grad:      4.0 mmHg LVOT Vmax:         77.00 cm/s LVOT Vmean:        50.300 cm/s LVOT VTI:          0.125 m LVOT/AV VTI ratio: 0.53  AORTA Ao Root diam: 2.80 cm MITRAL VALVE MV Area (PHT): 6.60 cm     SHUNTS MV Area VTI:   1.29 cm     Systemic VTI:  0.12 m MV Peak grad:  9.5 mmHg     Systemic Diam: 1.90 cm MV Mean grad:  4.0 mmHg MV Vmax:       1.54 m/s MV Vmean:      87.7 cm/s MV Decel Time: 115 msec MV E velocity: 87.80 cm/s MV A velocity: 118.00 cm/s MV E/A ratio:  0.74 Serafina Royals MD Electronically signed  by Serafina Royals MD Signature Date/Time: 09/30/2021/1:37:46 PM    Final         Scheduled Meds:  heparin  5,000 Units Subcutaneous Q8H   hydroxychloroquine  200 mg Oral BID   sodium chloride flush  3 mL Intravenous Q12H   Continuous Infusions:  sodium chloride     furosemide (LASIX) 200 mg in dextrose 5% 100 mL (2mg /mL) infusion 8 mg/hr (10/01/21 0507)     LOS: 2 days    Time spent: 31 mins     Wyvonnia Dusky, MD [facility] Pager [phone]  If 7PM-7AM, please contact night-coverage 10/01/2021, 9:04 AM

## 2021-10-01 NOTE — Evaluation (Signed)
Occupational Therapy Evaluation Patient Details Name: [name] MRN: [id number] DOB: [birth year] Today's Date: 10/01/2021   History of Present Illness Tonya Cummings is a 70 y.o. female with medical history significant for rheumatoid arthritis, CKD stage IIIb, depression,, anxiety, chronic low back pain, GERD, who presented to the hospital because of increasing shortness of breath. Lower extremity swelling has gotten worse.  She admits to orthopnea and says she sleeps in a recliner.  PT admitted for Acute diasstolic CHG and acute hypoxic respiratory failure. Imaging negative for Pulmonary embolism.   Clinical Impression   Pt seen for OT evaluation this date in setting of acute hospitalization d/t SOB. Pt presents this date reporting improved s/s including decreased LE swelling. Pt reports having good home support from spouse and children. She presents this date with decreased fxl activity tolerance 2/2 some lingering SOB, but is overall close to her functional baseline. She is encouraged to use RW more consistently for safety as well as energy conservation and reports good understanding of safe use technique. No further OT needs perceived at this time.      Recommendations for follow up therapy are one component of a multi-disciplinary discharge planning process, led by the attending physician.  Recommendations may be updated based on patient status, additional functional criteria and insurance authorization.   Follow Up Recommendations  No OT follow up    Equipment Recommendations  Other (comment) (2ww)    Recommendations for Other Services       Precautions / Restrictions Precautions Precautions: None Restrictions Weight Bearing Restrictions: No      Mobility Bed Mobility Overal bed mobility: Independent             General bed mobility comments: No physical assistance required    Transfers Overall transfer level: Independent Equipment used: None              General transfer comment: good control, no sway    Balance Overall balance assessment: No apparent balance deficits (not formally assessed)                                         ADL either performed or assessed with clinical judgement   ADL Overall ADL's : Modified independent;At baseline                                             Vision Baseline Vision/History: 1 Wears glasses Ability to See in Adequate Light: 0 Adequate Patient Visual Report: No change from baseline       Perception     Praxis      Pertinent Vitals/Pain Pain Assessment: No/denies pain     Hand Dominance     Extremity/Trunk Assessment Upper Extremity Assessment Upper Extremity Assessment: Overall WFL for tasks assessed   Lower Extremity Assessment Lower Extremity Assessment: Overall WFL for tasks assessed       Communication Communication Communication: No difficulties   Cognition Arousal/Alertness: Awake/alert Behavior During Therapy: WFL for tasks assessed/performed Overall Cognitive Status: Within Functional Limits for tasks assessed                                     General Comments  Pt oxygen montiored  throughout: On room air during ambulation with RW oxygen down to 86%, ambulation without AD SpO2 remained >91%    Exercises Other Exercises Other Exercises: OT engages pt in ed re: role of OT   Shoulder Instructions      Home Living Family/patient expects to be discharged to:: Private residence Living Arrangements: Spouse/significant other Available Help at Discharge: Family;Available 24 hours/day Type of Home: House Home Access: Stairs to enter [employer] of Steps: 3 Entrance Stairs-Rails: Right;Left;Can reach both Home Layout: One level     Bathroom Shower/Tub: Occupational psychologist: Standard     Home Equipment: Marine scientist - single point   Additional Comments: Pt reports living  with spouse and is able to ambulate household and community distances with [facility]      Prior Functioning/Environment Level of Independence: Independent with assistive device(s)        Comments: Usage of SPC at baseline        OT Problem List: Decreased activity tolerance;Cardiopulmonary status limiting activity      OT Treatment/Interventions:      OT Goals(Current goals can be found in the care plan section) Acute Rehab OT Goals Patient Stated Goal: to go home OT Goal Formulation: All assessment and education complete, DC therapy  OT Frequency:     Barriers to D/C:            Co-evaluation              AM-PAC OT "6 Clicks" Daily Activity     Outcome Measure Help from another person eating meals?: None Help from another person taking care of personal grooming?: None Help from another person toileting, which includes using toliet, bedpan, or urinal?: None Help from another person bathing (including washing, rinsing, drying)?: None Help from another person to put on and taking off regular upper body clothing?: None Help from another person to put on and taking off regular lower body clothing?: None 6 Click Score: 24   End of Session Equipment Utilized During Treatment: Rolling walker Nurse Communication: Mobility status  Activity Tolerance: Patient tolerated treatment well Patient left: in bed;with call bell/phone within reach  OT Visit Diagnosis: Muscle weakness (generalized) (M62.81)                Time: 1610-9604 OT Time Calculation (min): 10 min Charges:  OT General Charges $OT Visit: 1 Visit OT Evaluation $OT Eval Low Complexity: 1 Low  Gerrianne Scale, [place], OTR/L ascom [phone] 10/01/21, 5:57 PM

## 2021-10-02 ENCOUNTER — Other Ambulatory Visit: Payer: Medicare HMO

## 2021-10-02 ENCOUNTER — Encounter: Payer: Self-pay | Admitting: Internal Medicine

## 2021-10-02 DIAGNOSIS — M069 Rheumatoid arthritis, unspecified: Secondary | ICD-10-CM | POA: Diagnosis not present

## 2021-10-02 DIAGNOSIS — I5031 Acute diastolic (congestive) heart failure: Secondary | ICD-10-CM | POA: Diagnosis not present

## 2021-10-02 DIAGNOSIS — N1832 Chronic kidney disease, stage 3b: Secondary | ICD-10-CM | POA: Diagnosis not present

## 2021-10-02 LAB — CBC
HCT: 30.3 % — ABNORMAL LOW (ref 36.0–46.0)
Hemoglobin: 10.1 g/dL — ABNORMAL LOW (ref 12.0–15.0)
MCH: 32.1 pg (ref 26.0–34.0)
MCHC: 33.3 g/dL (ref 30.0–36.0)
MCV: 96.2 fL (ref 80.0–100.0)
Platelets: 307 10*3/uL (ref 150–400)
RBC: 3.15 MIL/uL — ABNORMAL LOW (ref 3.87–5.11)
RDW: 15.9 % — ABNORMAL HIGH (ref 11.5–15.5)
WBC: 13.8 10*3/uL — ABNORMAL HIGH (ref 4.0–10.5)
nRBC: 0 % (ref 0.0–0.2)

## 2021-10-02 LAB — BASIC METABOLIC PANEL
Anion gap: 12 (ref 5–15)
BUN: 58 mg/dL — ABNORMAL HIGH (ref 8–23)
CO2: 31 mmol/L (ref 22–32)
Calcium: 8.7 mg/dL — ABNORMAL LOW (ref 8.9–10.3)
Chloride: 94 mmol/L — ABNORMAL LOW (ref 98–111)
Creatinine, Ser: 2.4 mg/dL — ABNORMAL HIGH (ref 0.44–1.00)
GFR, Estimated: 21 mL/min — ABNORMAL LOW (ref >60)
Glucose, Bld: 123 mg/dL — ABNORMAL HIGH (ref 70–99)
Potassium: 3.7 mmol/L (ref 3.5–5.1)
Sodium: 137 mmol/L (ref 135–145)

## 2021-10-02 MED ORDER — FUROSEMIDE 20 MG PO TABS: 20.0000 mg | ORAL_TABLET | Freq: Every day | ORAL | 0 refills | Status: DC

## 2021-10-02 MED ORDER — HYDRALAZINE HCL 20 MG/ML IJ SOLN: 10.0000 mg | Freq: Four times a day (QID) | INTRAMUSCULAR | Status: DC | PRN

## 2021-10-02 MED ORDER — ENALAPRIL-HYDROCHLOROTHIAZIDE 10-25 MG PO TABS: 1.0000 | ORAL_TABLET | Freq: Every day | ORAL | Status: DC

## 2021-10-02 MED ORDER — INFLUENZA VAC A&B SA ADJ QUAD 0.5 ML IM PRSY: 0.5000 mL | PREFILLED_SYRINGE | INTRAMUSCULAR | Status: DC

## 2021-10-02 NOTE — Consult Note (Signed)
   Heart Failure Nurse Navigator Note  HFpEfF 55 to 60%.  Grade 1 diastolic dysfunction.  Mild mitral regurgitation.  She presented to the emergency room with complaints of shortness of breath gradually worsening over a 2-week time span.  He also had noted wheezing and worsening lower extremity edema.  Comorbidities:  Rheumatoid arthritis Chronic kidney disease stage III Depression/anxiety GERD   Medications:  Lasix infusion at 8 mg an hour which was discontinued on October 20.  Continuing to hold Aldactone and enalapril-HCTZ.  Labs:  Sodium 137, potassium 3.7, chloride 94, CO2 31, BUN 58, creatinine 2.4   Initial meeting with patient today.  She is lying quietly in bed in no acute distress.  Discussed the symptoms that brought her into the hospital, talked about why it is important to stay on top of her symptoms.  Also went over the importance of daily weights and recording and what to report to physician.  She states she is lost 20 pounds.  Went over low-sodium diet and not using salt at the table.  Also discussed the importance of following up in the outpatient heart failure clinic.  He has an appointment on October 31 at 11 AM.  She has a 2% rate of no-show, 1 out of 55 appointments.   Pricilla Riffle RN [facility]FN

## 2021-10-02 NOTE — Care Management Important Message (Signed)
Important Message  Patient Details  Name: Tonya Cummings MRN: [id number] Date of Birth: 1951-05-10   Medicare Important Message Given:  Yes     Dannette Barbara 10/02/2021, 1:52 PM

## 2021-10-02 NOTE — Discharge Summary (Signed)
Physician Discharge Summary  Tonya Cummings:[id number] DOB: 29-Apr-1951 DOA: 09/29/2021  PCP: [name], NP  Admit date: 09/29/2021 Discharge date: 10/02/2021  Admitted From: home  Disposition:  home  Recommendations for Outpatient Follow-up:  Follow up with PCP in 1-2 weeks F/u w/ cardio in 1-2 weeks   Home Health: no  Equipment/Devices:  Discharge Condition: stable  CODE STATUS: full  Diet recommendation: Heart Healthy   Brief/Interim Summary: HPI was taken from Dr. Mal Misty: Tonya Cummings is a 69 y.o. female with medical history significant for rheumatoid arthritis, CKD stage IIIb, depression,, anxiety, chronic low back pain, GERD, who presented to the hospital because of increasing shortness of breath.  She says she been feeling short of breath for about 2 weeks now however symptoms worsened about 3 days ago.  She also noticed that she has been wheezing for the past 2 days.  She also complains of leg swelling since November 2021 after she had right knee surgery.  She said she was started on Aldactone by her doctor about 2 months ago for leg swelling.  Lower extremity swelling has gotten worse.  She admits to orthopnea and says she sleeps in a recliner.  But she also reports she sleeps in a recliner because of of her back and knees.  No chest pain, cough, palpitations, dizziness, nausea, vomiting, abdominal pain   ED Course:  The patient was hypertensive and hypoxemic in the emergency room.  Her oxygen saturation dropped to 87% on room air after walking to the bathroom.  She was given IV Lasix in the ED   Hospital course from Dr. Jimmye Norman 10/19-10/21/22: Pt presented w/ shortness of breath likely secondary to acute diastolic CHF. Pt was initially started on IV lasix drip by admitting physician. Pt's respiratory status improved and pt was weaned off of supplemental oxygen. Of note, V/Q scan was done and did not suggest PE. PT/OT evaluated the pt and pt did not need  any further therapy.     Discharge Diagnoses:  Active Problems:   CKD (chronic kidney disease) stage 3, GFR 30-59 ml/min (HCC)   Dyspnea   Acute CHF (congestive heart failure) (HCC)   Acute hypoxemic respiratory failure (HCC) Acute diastolic CHF:  with peripheral edema & shortness of breath. Improved shortness of breath today.  Echo shows EF 95-62%, grade I diastolic dysfunction & no regional wall abnormalities. D/c IV lasix drip. D/c home w/ po lasix    Acute hypoxic respiratory failure: VQ scan shows no perfusion defects to suggest PE. Resolved    AKI on CKDIIIb: Cr is trending down slightly today. Will restart home dose of aldactone but continue to hold enalapril & HCTZ until pt see her PCP and/or cardiologist.   Hyperkalemia: resolved  Rheumatoid arthritis: continue on hydroxychloroquine   HTN: continue to hold enalapril-HCTZ. Restarted home dose of aldactone   RLS: restarted home dose of pramipexole   Obesity: BMI is 37.4. Complicates overall care & prognosis    Discharge Instructions  Discharge Instructions     Diet - low sodium heart healthy   Complete by: As directed    Discharge instructions   Complete by: As directed    F/u w/ PCP in 1 week. Will need to see a cardiologist as well for diastolic CHF but will possibly need a referral from your PCP   Increase activity slowly   Complete by: As directed       Allergies as of 10/02/2021       Reactions  Gabapentin Other (See Comments)   Weight gain   Ibuprofen Other (See Comments)   Headache        Medication List     TAKE these medications    Adalimumab 40 MG/0.4ML Pskt Inject 40 mg into the skin every 14 (fourteen) days.   albuterol 108 (90 Base) MCG/ACT inhaler Commonly known as: VENTOLIN HFA Inhale 2 puffs into the lungs every 4 (four) hours as needed.   ASCORBIC ACID PO Take 1 tablet by mouth daily.   calcitRIOL 0.25 MCG capsule Commonly known as: ROCALTROL Take by mouth.   Calcium  Carb-Cholecalciferol 600-400 MG-UNIT Caps Take 1 capsule by mouth 2 (two) times daily.   Cholecalciferol 50 MCG (2000 UT) Caps Take 2,000 Units by mouth daily.   cyclobenzaprine 10 MG tablet Commonly known as: FLEXERIL Take 10 mg by mouth at bedtime.   enalapril-hydrochlorothiazide 10-25 MG tablet Commonly known as: VASERETIC Take 1 tablet by mouth daily. Hold this medication until you see your PCP and/or cardiologist What changed: additional instructions   Excedrin Tension Headache 500-65 MG Tabs Generic drug: Acetaminophen-Caffeine Take 1 tablet by mouth daily as needed (Headache).   ferrous sulfate 325 (65 FE) MG tablet Take 325 mg by mouth daily with breakfast.   FLUoxetine 40 MG capsule Commonly known as: PROZAC Take 40 mg by mouth daily.   folic acid 1 MG tablet Commonly known as: FOLVITE Take 1 mg by mouth daily.   furosemide 20 MG tablet Commonly known as: Lasix Take 1 tablet (20 mg total) by mouth daily.   hydroxychloroquine 200 MG tablet Commonly known as: PLAQUENIL Take 1 tablet by mouth 2 (two) times daily.   methotrexate 1 g injection Commonly known as: 50 mg/ml Inject 25 mg into the vein once a week. .6 ml   mirtazapine 30 MG tablet Commonly known as: REMERON   MULTIVITAMIN ADULT PO Take 1 tablet by mouth daily.   omeprazole 40 MG capsule Commonly known as: PRILOSEC Take 40 mg by mouth 2 (two) times daily.   pramipexole 0.125 MG tablet Commonly known as: MIRAPEX Take 0.125 mg by mouth daily. Take 0.125 mg in the morning and 2 at bedtime   Propylene Glycol 0.6 % Soln Apply to eye.   Rinvoq 15 MG Tb24 Generic drug: Upadacitinib ER Take 1 tablet by mouth daily.   rosuvastatin 5 MG tablet Commonly known as: CRESTOR Take 5 mg by mouth every other day.   spironolactone 25 MG tablet Commonly known as: ALDACTONE   triamcinolone ointment 0.5 % Commonly known as: KENALOG Apply topically 2 (two) times daily.   vitamin B-12 500 MCG  tablet Commonly known as: CYANOCOBALAMIN Take 500 mcg by mouth daily.        Follow-up Information     Gauger, Victoriano Lain, NP Follow up on 10/13/2021.   Specialty: Internal Medicine Why: @ 10:45am Patient will see Payton Mccallum NP Contact information: [place] [address] [phone]                Allergies  Allergen Reactions   Gabapentin Other (See Comments)    Weight gain   Ibuprofen Other (See Comments)    Headache    Consultations:    Procedures/Studies: DG Chest 2 View  Result Date: 09/29/2021 CLINICAL DATA:  Shortness of breath EXAM: CHEST - 2 VIEW COMPARISON:  09/23/2021 FINDINGS: Heart size is normal. Large hiatal hernia as seen previously. Pulmonary venous hypertension without frank edema. No infiltrate,  collapse or effusion. IMPRESSION: Pulmonary venous hypertension without frank edema. Hiatal hernia. Electronically Signed   By: Nelson Chimes M.D.   On: 09/29/2021 11:45   DG Chest 2 View  Result Date: 09/23/2021 CLINICAL DATA:  Shortness of breath for 2 weeks EXAM: CHEST - 2 VIEW COMPARISON:  02/17/2018 FINDINGS: Unchanged cardiomediastinal silhouette. Hiatal hernia. No focal airspace consolidation. There is no large pleural effusion or visible pneumothorax. There is no acute osseous abnormality. Thoracic spondylosis. IMPRESSION: No evidence of acute cardiopulmonary disease.  Hiatal hernia. Electronically Signed   By: Maurine Simmering M.D.   On: 09/23/2021 15:14   NM Pulmonary Perfusion  Result Date: 09/29/2021 CLINICAL DATA:  Respiratory failure.  Lower extremity edema. EXAM: NUCLEAR MEDICINE PERFUSION LUNG SCAN TECHNIQUE: Perfusion images were obtained in multiple projections after intravenous injection of radiopharmaceutical. Ventilation scans intentionally deferred if perfusion scan and chest x-ray adequate for interpretation during COVID 19 epidemic. RADIOPHARMACEUTICALS:  4.33 mCi Tc-1m MAA IV  COMPARISON:  Chest radiography same day FINDINGS: Pulmonary perfusion imaging is normal. No finding to suggest pulmonary emboli. IMPRESSION: No perfusion defects to suggest pulmonary emboli. Electronically Signed   By: Nelson Chimes M.D.   On: 09/29/2021 15:33   ECHOCARDIOGRAM COMPLETE  Result Date: 09/30/2021    ECHOCARDIOGRAM REPORT   Patient Name:   Tonya Cummings Date of Exam: 09/30/2021 Medical Rec #:  [id number]         Height:       64.0 in Accession #:    [id number]        Weight:       225.0 lb Date of Birth:  12/27/1950        BSA:          2.057 m Patient Age:    73 years          BP:           137/74 mmHg Patient Gender: F                 HR:           91 bpm. Exam Location:  [facility] Procedure: 2D Echo, Color Doppler, Cardiac Doppler and Intracardiac            Opacification Agent Indications:     R06.00 Dyspnea  History:         Patient has no prior history of Echocardiogram examinations.                  CKD, Signs/Symptoms:Edema and Shoulder pain; Risk                  Factors:Hypertension and Dyslipidemia.  Sonographer:     Charmayne Sheer Referring Phys:  [place] Diagnosing Phys: Serafina Royals MD  Sonographer Comments: Suboptimal apical window and no subcostal window. IMPRESSIONS  1. Left ventricular ejection fraction, by estimation, is 55 to 60%. The left ventricle has normal function. The left ventricle has no regional wall motion abnormalities. Left ventricular diastolic parameters are consistent with Grade I diastolic dysfunction (impaired relaxation).  2. Right ventricular systolic function is normal. The right ventricular size is normal.  3. The mitral valve is normal in structure. Mild mitral valve regurgitation.  4. The aortic valve is normal in structure. Aortic valve regurgitation is not visualized. FINDINGS  Left Ventricle: Left ventricular ejection fraction, by estimation, is 55 to 60%. The left ventricle has normal function. The left ventricle has no regional wall motion  abnormalities. Definity contrast agent  was given IV to delineate the left ventricular  endocardial borders. The left ventricular internal cavity size was normal in size. There is no left ventricular hypertrophy. Left ventricular diastolic parameters are consistent with Grade I diastolic dysfunction (impaired relaxation). Right Ventricle: The right ventricular size is normal. No increase in right ventricular wall thickness. Right ventricular systolic function is normal. Left Atrium: Left atrial size was normal in size. Right Atrium: Right atrial size was normal in size. Pericardium: There is no evidence of pericardial effusion. Mitral Valve: The mitral valve is normal in structure. Mild mitral valve regurgitation. MV peak gradient, 9.5 mmHg. The mean mitral valve gradient is 4.0 mmHg. Tricuspid Valve: The tricuspid valve is normal in structure. Tricuspid valve regurgitation is mild. Aortic Valve: The aortic valve is normal in structure. Aortic valve regurgitation is not visualized. Aortic valve mean gradient measures 4.0 mmHg. Aortic valve peak gradient measures 8.0 mmHg. Aortic valve area, by VTI measures 1.49 cm. Pulmonic Valve: The pulmonic valve was normal in structure. Pulmonic valve regurgitation is trivial. Aorta: The aortic root and ascending aorta are structurally normal, with no evidence of dilitation. IAS/Shunts: No atrial level shunt detected by color flow Doppler.  LEFT VENTRICLE PLAX 2D LVIDd:         4.45 cm   Diastology LVIDs:         2.75 cm   LV e' medial:    5.66 cm/s LV PW:         1.15 cm   LV E/e' medial:  15.5 LV IVS:        0.90 cm   LV e' lateral:   5.22 cm/s LVOT diam:     1.90 cm   LV E/e' lateral: 16.8 LV SV:         35 LV SV Index:   17 LVOT Area:     2.84 cm  RIGHT VENTRICLE RV Basal diam:  4.80 cm LEFT ATRIUM           Index        RIGHT ATRIUM           Index LA diam:      3.30 cm 1.60 cm/m   RA Area:     14.10 cm LA Vol (A4C): 93.2 ml 45.31 ml/m  RA Volume:   32.70 ml  15.90  ml/m  AORTIC VALVE                    PULMONIC VALVE AV Area (Vmax):    1.55 cm     PV Vmax:       1.36 m/s AV Area (Vmean):   1.49 cm     PV Vmean:      107.000 cm/s AV Area (VTI):     1.49 cm     PV VTI:        0.268 m AV Vmax:           141.00 cm/s  PV Peak grad:  7.4 mmHg AV Vmean:          95.900 cm/s  PV Mean grad:  5.0 mmHg AV VTI:            0.238 m AV Peak Grad:      8.0 mmHg AV Mean Grad:      4.0 mmHg LVOT Vmax:         77.00 cm/s LVOT Vmean:        50.300 cm/s LVOT VTI:  0.125 m LVOT/AV VTI ratio: 0.53  AORTA Ao Root diam: 2.80 cm MITRAL VALVE MV Area (PHT): 6.60 cm     SHUNTS MV Area VTI:   1.29 cm     Systemic VTI:  0.12 m MV Peak grad:  9.5 mmHg     Systemic Diam: 1.90 cm MV Mean grad:  4.0 mmHg MV Vmax:       1.54 m/s MV Vmean:      87.7 cm/s MV Decel Time: 115 msec MV E velocity: 87.80 cm/s MV A velocity: 118.00 cm/s MV E/A ratio:  0.74 Serafina Royals MD Electronically signed by Serafina Royals MD Signature Date/Time: 09/30/2021/1:37:46 PM    Final    (Echo, Carotid, EGD, Colonoscopy, ERCP)    Subjective: Pt denies any complaints    Discharge Exam: Vitals:   10/02/21 1000 10/02/21 1113  BP: (!) 143/85 (!) 141/60  Pulse: 95 87  Resp: 17 16  Temp: 98.1 F (36.7 C) 98.3 F (36.8 C)  SpO2: 98% 97%   Vitals:   10/02/21 0531 10/02/21 0739 10/02/21 1000 10/02/21 1113  BP: (!) 147/71 (!) 145/63 (!) 143/85 (!) 141/60  Pulse: 95 99 95 87  Resp: 20 16 17 16   Temp: 98.3 F (36.8 C) 98 F (36.7 C) 98.1 F (36.7 C) 98.3 F (36.8 C)  TempSrc: Oral Oral Oral Oral  SpO2: 91% 97% 98% 97%  Weight:      Height:        General: Pt is alert, awake, not in acute distress Cardiovascular: S1/S2 +, no rubs, no gallops Respiratory: CTA bilaterally, no wheezing, no rhonchi Abdominal: Soft, NT, obese, bowel sounds + Extremities: no cyanosis    The results of significant diagnostics from this hospitalization (including imaging, microbiology, ancillary and laboratory) are  listed below for reference.     Microbiology: Recent Results (from the past 240 hour(s))  Resp Panel by RT-PCR (Flu A&B, Covid) Nasopharyngeal Swab     Status: None   Collection Time: 09/29/21 12:27 PM   Specimen: Nasopharyngeal Swab; Nasopharyngeal(NP) swabs in vial transport medium  Result Value Ref Range Status   SARS Coronavirus 2 by RT PCR NEGATIVE NEGATIVE Final    Comment: (NOTE) SARS-CoV-2 target nucleic acids are NOT DETECTED.  The SARS-CoV-2 RNA is generally detectable in upper respiratory specimens during the acute phase of infection. The lowest concentration of SARS-CoV-2 viral copies this assay can detect is 138 copies/mL. A negative result does not preclude SARS-Cov-2 infection and should not be used as the sole basis for treatment or other patient management decisions. A negative result may occur with  improper specimen collection/handling, submission of specimen other than nasopharyngeal swab, presence of viral mutation(s) within the areas targeted by this assay, and inadequate number of viral copies(<138 copies/mL). A negative result must be combined with clinical observations, patient history, and epidemiological information. The expected result is Negative.  Fact Sheet for Patients:  EntrepreneurPulse.com.au  Fact Sheet for Healthcare Providers:  IncredibleEmployment.be  This test is no t yet approved or cleared by the [place] FDA and  has been authorized for detection and/or diagnosis of SARS-CoV-2 by FDA under an Emergency Use Authorization (EUA). This EUA will remain  in effect (meaning this test can be used) for the duration of the COVID-19 declaration under Section 564(b)(1) of the Act, 21 U.S.C.section 360bbb-3(b)(1), unless the authorization is terminated  or revoked sooner.       Influenza A by PCR NEGATIVE NEGATIVE Final   Influenza B by PCR NEGATIVE  NEGATIVE Final    Comment: (NOTE) The Xpert Xpress  SARS-CoV-2/FLU/RSV plus assay is intended as an aid in the diagnosis of influenza from Nasopharyngeal swab specimens and should not be used as a sole basis for treatment. Nasal washings and aspirates are unacceptable for Xpert Xpress SARS-CoV-2/FLU/RSV testing.  Fact Sheet for Patients: EntrepreneurPulse.com.au  Fact Sheet for Healthcare Providers: IncredibleEmployment.be  This test is not yet approved or cleared by the [place] FDA and has been authorized for detection and/or diagnosis of SARS-CoV-2 by FDA under an Emergency Use Authorization (EUA). This EUA will remain in effect (meaning this test can be used) for the duration of the COVID-19 declaration under Section 564(b)(1) of the Act, 21 U.S.C. section 360bbb-3(b)(1), unless the authorization is terminated or revoked.  Performed at [facility], [address] [place], [place] [address]      Labs: BNP (last 3 results) Recent Labs    09/29/21 1107  BNP 02.5   Basic Metabolic Panel: Recent Labs  Lab 09/29/21 1107 09/30/21 0634 10/01/21 0608 10/02/21 0521  NA 137 137 136 137  K 5.2* 4.9 4.0 3.7  CL 101 98 92* 94*  CO2 26 29 30 31   GLUCOSE 99 100* 165* 123*  BUN 36* 36* 50* 58*  CREATININE 1.63* 1.82* 2.42* 2.40*  CALCIUM 9.1 9.0 8.7* 8.7*   Liver Function Tests: No results for input(s): AST, ALT, ALKPHOS, BILITOT, PROT, ALBUMIN in the last 168 hours. No results for input(s): LIPASE, AMYLASE in the last 168 hours. No results for input(s): AMMONIA in the last 168 hours. CBC: Recent Labs  Lab 09/29/21 1107 10/01/21 0608 10/02/21 0521  WBC 10.3 11.4* 13.8*  HGB 9.7* 10.2* 10.1*  HCT 29.3* 31.2* 30.3*  MCV 98.3 95.4 96.2  PLT 292 320 307   Cardiac Enzymes: No results for input(s): CKTOTAL, CKMB, CKMBINDEX, TROPONINI in the last 168 hours. BNP: Invalid input(s): POCBNP CBG: No results for input(s): GLUCAP in the last 168 hours. D-Dimer No results  for input(s): DDIMER in the last 72 hours. Hgb A1c No results for input(s): HGBA1C in the last 72 hours. Lipid Profile No results for input(s): CHOL, HDL, LDLCALC, TRIG, CHOLHDL, LDLDIRECT in the last 72 hours. Thyroid function studies No results for input(s): TSH, T4TOTAL, T3FREE, THYROIDAB in the last 72 hours.  Invalid input(s): FREET3 Anemia work up No results for input(s): VITAMINB12, FOLATE, FERRITIN, TIBC, IRON, RETICCTPCT in the last 72 hours. Urinalysis    Component Value Date/Time   COLORURINE YELLOW (A) 10/03/2020 1133   APPEARANCEUR CLEAR (A) 10/03/2020 1133   LABSPEC 1.017 10/03/2020 1133   PHURINE 5.0 10/03/2020 1133   GLUCOSEU NEGATIVE 10/03/2020 1133   HGBUR NEGATIVE 10/03/2020 1133   BILIRUBINUR NEGATIVE 10/03/2020 1133   KETONESUR NEGATIVE 10/03/2020 1133   PROTEINUR NEGATIVE 10/03/2020 1133   NITRITE NEGATIVE 10/03/2020 1133   LEUKOCYTESUR TRACE (A) 10/03/2020 1133   Sepsis Labs Invalid input(s): PROCALCITONIN,  WBC,  LACTICIDVEN Microbiology Recent Results (from the past 240 hour(s))  Resp Panel by RT-PCR (Flu A&B, Covid) Nasopharyngeal Swab     Status: None   Collection Time: 09/29/21 12:27 PM   Specimen: Nasopharyngeal Swab; Nasopharyngeal(NP) swabs in vial transport medium  Result Value Ref Range Status   SARS Coronavirus 2 by RT PCR NEGATIVE NEGATIVE Final    Comment: (NOTE) SARS-CoV-2 target nucleic acids are NOT DETECTED.  The SARS-CoV-2 RNA is generally detectable in upper respiratory specimens during the acute phase of infection. The lowest concentration of SARS-CoV-2 viral copies this  assay can detect is 138 copies/mL. A negative result does not preclude SARS-Cov-2 infection and should not be used as the sole basis for treatment or other patient management decisions. A negative result may occur with  improper specimen collection/handling, submission of specimen other than nasopharyngeal swab, presence of viral mutation(s) within the areas  targeted by this assay, and inadequate number of viral copies(<138 copies/mL). A negative result must be combined with clinical observations, patient history, and epidemiological information. The expected result is Negative.  Fact Sheet for Patients:  EntrepreneurPulse.com.au  Fact Sheet for Healthcare Providers:  IncredibleEmployment.be  This test is no t yet approved or cleared by the [place] FDA and  has been authorized for detection and/or diagnosis of SARS-CoV-2 by FDA under an Emergency Use Authorization (EUA). This EUA will remain  in effect (meaning this test can be used) for the duration of the COVID-19 declaration under Section 564(b)(1) of the Act, 21 U.S.C.section 360bbb-3(b)(1), unless the authorization is terminated  or revoked sooner.       Influenza A by PCR NEGATIVE NEGATIVE Final   Influenza B by PCR NEGATIVE NEGATIVE Final    Comment: (NOTE) The Xpert Xpress SARS-CoV-2/FLU/RSV plus assay is intended as an aid in the diagnosis of influenza from Nasopharyngeal swab specimens and should not be used as a sole basis for treatment. Nasal washings and aspirates are unacceptable for Xpert Xpress SARS-CoV-2/FLU/RSV testing.  Fact Sheet for Patients: EntrepreneurPulse.com.au  Fact Sheet for Healthcare Providers: IncredibleEmployment.be  This test is not yet approved or cleared by the [place] FDA and has been authorized for detection and/or diagnosis of SARS-CoV-2 by FDA under an Emergency Use Authorization (EUA). This EUA will remain in effect (meaning this test can be used) for the duration of the COVID-19 declaration under Section 564(b)(1) of the Act, 21 U.S.C. section 360bbb-3(b)(1), unless the authorization is terminated or revoked.  Performed at [facility], [address] [place], [place] [address]      Time coordinating discharge: Over 30  minutes  SIGNED:   Wyvonnia Dusky, MD  [facility] 10/02/2021, 2:54 PM Pager   If 7PM-7AM, please contact night-coverage

## 2021-10-02 NOTE — Progress Notes (Signed)
Patient is alert and oriented. Came in due to Shortness of Breath. Diagnosed with Congestive Heart Failure. Complained of "swelling in my legs." Edema noted. Assessment of lower extremities shows bilateral edema +2.   Patient scheduled to follow up with Cardiologist on 10/12/2021. Discharge orders have been put in today.

## 2021-10-05 ENCOUNTER — Other Ambulatory Visit: Payer: Self-pay

## 2021-10-05 DIAGNOSIS — I5031 Acute diastolic (congestive) heart failure: Secondary | ICD-10-CM

## 2021-10-06 ENCOUNTER — Other Ambulatory Visit: Payer: Self-pay

## 2021-10-06 DIAGNOSIS — Z96651 Presence of right artificial knee joint: Secondary | ICD-10-CM | POA: Diagnosis not present

## 2021-10-06 DIAGNOSIS — Z79899 Other long term (current) drug therapy: Secondary | ICD-10-CM | POA: Diagnosis not present

## 2021-10-06 DIAGNOSIS — M059 Rheumatoid arthritis with rheumatoid factor, unspecified: Secondary | ICD-10-CM | POA: Diagnosis not present

## 2021-10-06 NOTE — Patient Outreach (Signed)
[facility]) Care Management  10/06/2021  SHAM ALVIAR Aug 04, 1951 [id number]   Referral Date: 10/06/21 Referral Source: Hospital Liaison Referral Reason: Recent hospitalization CHF Discharge 10/02/21   Outreach Attempt: No answer. HIPAA compliant voice message left.     Plan: RN CM will attempt again within 4 business days and send letter.    Jone Baseman, RN, MSN [facility] Care Management Care Management Coordinator Direct Line [phone] Toll Free: [phone]  Fax: [phone]

## 2021-10-07 ENCOUNTER — Other Ambulatory Visit: Payer: Self-pay

## 2021-10-07 NOTE — Patient Outreach (Signed)
[facility]) Care Management  10/07/2021  Tonya Cummings 1951-03-27 [id number]   Referral Date: 10/06/21 Referral Source: Hospital Liaison Referral Reason: Recent hospitalization CHF Discharge 10/02/21     Outreach Attempt: No answer. HIPAA compliant voice message left.       Plan: RN CM will attempt again within 4 business days.  Jone Baseman, RN, MSN [place] [facility] Care Management Coordinator Direct Line [phone] Cell [phone] Toll Free: [phone]  Fax: [phone]

## 2021-10-08 ENCOUNTER — Other Ambulatory Visit: Payer: Self-pay

## 2021-10-08 DIAGNOSIS — D472 Monoclonal gammopathy: Secondary | ICD-10-CM | POA: Diagnosis not present

## 2021-10-08 DIAGNOSIS — N1832 Chronic kidney disease, stage 3b: Secondary | ICD-10-CM | POA: Diagnosis not present

## 2021-10-08 DIAGNOSIS — I10 Essential (primary) hypertension: Secondary | ICD-10-CM | POA: Diagnosis not present

## 2021-10-08 DIAGNOSIS — N2581 Secondary hyperparathyroidism of renal origin: Secondary | ICD-10-CM | POA: Diagnosis not present

## 2021-10-08 DIAGNOSIS — N184 Chronic kidney disease, stage 4 (severe): Secondary | ICD-10-CM | POA: Diagnosis not present

## 2021-10-08 DIAGNOSIS — D631 Anemia in chronic kidney disease: Secondary | ICD-10-CM | POA: Diagnosis not present

## 2021-10-08 NOTE — Patient Outreach (Signed)
[name] Care Management  10/08/2021  Tonya Cummings Mar 05, 1951 [id number]   Referral Date: 10/06/21 Referral Source: Hospital Liaison Referral Reason: Recent hospitalization CHF Discharge 10/02/21     Outreach Attempt: No answer. HIPAA compliant voice message left.       Plan: RN CM will attempt again in 3 weeks.    Jone Baseman, RN, MSN [place] Management Care Management Coordinator Direct Line [phone] Cell [phone] Toll Free: [phone]  Fax: [phone]

## 2021-10-09 ENCOUNTER — Ambulatory Visit: Payer: Self-pay

## 2021-10-10 NOTE — Progress Notes (Signed)
Patient ID: Tonya Cummings, female    DOB: [birth year], 70 y.o.   MRN: [id number]  HPI  Ms Sitts is a 70 y/o female with a history of hyperlipidemia, HTN, CKD, anxiety, chronic pain, depression, GERD, anemia, restless leg syndrome, obstructive sleep apnea and chronic heart failure.   Echo report from 09/30/21 reviewed and showed an EF of 55-60% along with mild MR but without LVH/LAE.   Admitted 09/29/21 due to worsening shortness of breath. Initially placed on lasix gtt with transition to oral diuretics. Weaned off supplemental oxygen. V/Q scan negative for PE. Discharged after 3 days.  She presents today for her initial visit with a chief complaint of moderate shortness of breath with little exertion. She describes this as being present for several weeks and feels like it's gotten a little worse over the last few days. She has associated fatigue, cough, intermittent chest pain, pedal edema (worsening), light-headedness, joint pain, anxiety and difficulty sleeping along with this. She denies any abdominal distention or palpitations.  She saw nephrology last week and had her furosemide increased to 40mg  daily 3 days ago. Doesn't feel like it's really helped much. She and her husband are concerned because they say that she has difficulty taking in a deep breath.   Past Medical History:  Diagnosis Date   Anxiety    CHF (congestive heart failure) (HCC)    Chronic kidney disease    Chronic pain    Chronic radicular pain of lower back    Costochondritis    Depression    Encounter for blood transfusion    GERD (gastroesophageal reflux disease)    Headache    migraines   Hip dysplasia    Hyperlipidemia    Hypertension    Iron deficiency anemia    Low back pain    Low vitamin B12 level    Obesity    Osteoporosis    Pelvic fracture (HCC)    Raynaud's disease without gangrene    Restless leg syndrome    Rheumatoid aortitis    Rheumatoid arthritis (HCC)    polyarthritis    Rheumatoid arthritis (HCC)    Seasonal allergies    Sleep disorder    Thoracic compression fracture ([place])    Past Surgical History:  Procedure Laterality Date   ABDOMINAL SURGERY     pt denies   APPENDECTOMY     CARPAL TUNNEL RELEASE Bilateral    CHOLECYSTECTOMY     COLONOSCOPY WITH PROPOFOL N/A 08/15/2020   Procedure: COLONOSCOPY WITH PROPOFOL;  Surgeon: Robert Bellow, MD;  Location: [place];  Service: Endoscopy;  Laterality: N/A;   DILATION AND CURETTAGE OF UTERUS     ESOPHAGOGASTRODUODENOSCOPY (EGD) WITH PROPOFOL N/A 08/15/2020   Procedure: ESOPHAGOGASTRODUODENOSCOPY (EGD) WITH PROPOFOL;  Surgeon: Robert Bellow, MD;  Location: [facility];  Service: Endoscopy;  Laterality: N/A;   FRACTURE SURGERY     hip fracture    FRACTURE SURGERY     pelvic fracture plate    HIP SURGERY Left    KNEE ARTHROPLASTY Right 10/13/2020   Procedure: COMPUTER ASSISTED TOTAL KNEE ARTHROPLASTY - RNFA;  Surgeon: Dereck Leep, MD;  Location: [facility];  Service: Orthopedics;  Laterality: Right;   TUBAL LIGATION     Family History  Problem Relation Age of Onset   Diabetes Mother    Hypertension Mother    Aneurysm Father    Diabetes Son    Seizures Son    Osteosarcoma Brother    Cancer Brother  Diabetes Brother    Diabetes Maternal Grandfather    Heart disease Maternal Grandfather    Social History   Tobacco Use   Smoking status: Never   Smokeless tobacco: Never  Substance Use Topics   Alcohol use: No   Allergies  Allergen Reactions   Gabapentin Other (See Comments)    Weight gain   Ibuprofen Other (See Comments)    Headache   Prior to Admission medications   Medication Sig Start Date End Date Taking? Authorizing Provider  Acetaminophen-Caffeine (EXCEDRIN TENSION HEADACHE) 500-65 MG TABS Take 1 tablet by mouth daily as needed (Headache).   Yes [provider]  Adalimumab 40 MG/0.4ML PSKT Inject 40 mg into the skin every 14 (fourteen) days.   Yes [provider]  albuterol (VENTOLIN HFA) 108 (90 Base) MCG/ACT inhaler Inhale 2 puffs into the lungs every 4 (four) hours as needed. 09/23/21  Yes Margarette Canada, NP  ASCORBIC ACID PO Take 1 tablet by mouth daily.    Yes [provider]  calcitRIOL (ROCALTROL) 0.25 MCG capsule Take by mouth. 05/22/21  Yes [provider]  Calcium Carb-Cholecalciferol 600-400 MG-UNIT CAPS Take 1 capsule by mouth 2 (two) times daily. 01/13/10  Yes [provider]  Cholecalciferol 50 MCG (2000 UT) CAPS Take 2,000 Units by mouth daily. 09/23/09  Yes [provider]  cyclobenzaprine (FLEXERIL) 10 MG tablet Take 10 mg by mouth at bedtime.   Yes [provider]  ferrous sulfate 325 (65 FE) MG tablet Take 325 mg by mouth daily with breakfast.   Yes [provider]  FLUoxetine (PROZAC) 40 MG capsule Take 40 mg by mouth daily.   Yes [provider]  folic acid (FOLVITE) 1 MG tablet Take 1 mg by mouth daily.   Yes [provider]  furosemide (LASIX) 20 MG tablet Take 1 tablet (20 mg total) by mouth daily. Patient taking differently: Take 40 mg by mouth daily. 10/02/21 11/01/21 Yes Wyvonnia Dusky, MD  hydroxychloroquine (PLAQUENIL) 200 MG tablet Take 1 tablet by mouth 2 (two) times daily. 06/22/21  Yes [provider]  methotrexate (50 MG/ML) 1 g injection Inject 25 mg into the vein once a week. .6 ml   Yes [provider]  mirtazapine (REMERON) 30 MG tablet  05/12/21  Yes [provider]  Multiple Vitamins-Minerals (MULTIVITAMIN ADULT PO) Take 1 tablet by mouth daily.  09/10/08  Yes [provider]  omeprazole (PRILOSEC) 40 MG capsule Take 40 mg by mouth 2 (two) times daily.  01/11/20  Yes [provider]  pramipexole (MIRAPEX) 0.125 MG tablet Take 0.125 mg by mouth daily. Take 0.125 mg in the morning and 2 at bedtime 02/15/18  Yes [provider]  Propylene Glycol 0.6 % SOLN Apply to eye.   Yes [provider]  rosuvastatin (CRESTOR) 5 MG tablet Take 5 mg by mouth every other day.  07/18/19  Yes [provider]  spironolactone (ALDACTONE) 25 MG tablet  04/09/21  Yes [provider]  triamcinolone ointment (KENALOG) 0.5 % Apply topically 2 (two) times daily. 04/08/21 04/08/22 Yes [provider]  vitamin B-12 (CYANOCOBALAMIN) 500 MCG tablet Take 500 mcg by mouth daily.   Yes [provider]  RINVOQ 15 MG TB24 Take 1 tablet by mouth daily. 03/09/21   [provider]    Review of Systems  Constitutional:  Positive for fatigue (easily). Negative for appetite change.  HENT:  Positive for voice change. Negative for congestion, postnasal drip  and sore throat.   Eyes: Negative.   Respiratory:  Positive for cough and shortness of breath (easily). Negative for chest tightness.   Cardiovascular:  Positive for chest pain (at times) and leg swelling. Negative for palpitations.  Gastrointestinal:  Negative for abdominal distention and abdominal pain.  Endocrine: Negative.   Genitourinary: Negative.   Musculoskeletal:  Positive for arthralgias (right shoulder/ feet). Negative for back pain.  Skin: Negative.   Allergic/Immunologic: Negative.   Neurological:  Positive for light-headedness. Negative for dizziness.  Hematological:  Negative for adenopathy. Does not bruise/bleed easily.  Psychiatric/Behavioral:  Positive for sleep disturbance (sleeping in recliner due to PND). Negative for dysphoric mood. The patient is nervous/anxious.    Vitals:   10/12/21 1113  BP: (!) 167/83  Pulse: 94  Resp: 20  SpO2: 91%  Weight: 228 lb 6 oz (103.6 kg)  Height: 5\' 4"  (1.626 m)   Wt Readings from Last 3 Encounters:  10/12/21 228 lb 6 oz (103.6 kg)  10/02/21 218 lb 0.6 oz (98.9 kg)  07/01/21 222 lb 10.6 oz (101 kg)   Lab Results  Component Value Date   CREATININE 2.40 (H) 10/02/2021   CREATININE 2.42 (H) 10/01/2021   CREATININE 1.82 (H) 09/30/2021   Physical  Exam Vitals and nursing note reviewed. Exam conducted with a chaperone present (husband).  Constitutional:      Appearance: Normal appearance.  HENT:     Head: Normocephalic and atraumatic.  Cardiovascular:     Rate and Rhythm: Normal rate and regular rhythm.  Pulmonary:     Effort: Pulmonary effort is normal. No respiratory distress.     Breath sounds: No wheezing or rales.     Comments: Diminished in RLL Abdominal:     General: There is no distension.     Palpations: Abdomen is soft.     Tenderness: There is no abdominal tenderness.  Musculoskeletal:        General: No tenderness.     Cervical back: Normal range of motion and neck supple.     Right lower leg: Edema (2+ pitting) present.     Left lower leg: Edema (2+ pitting) present.  Skin:    General: Skin is warm and dry.  Neurological:     General: No focal deficit present.     Mental Status: She is alert and oriented to person, place, and time.  Psychiatric:        Mood and Affect: Mood is anxious.        Behavior: Behavior normal.        Thought Content: Thought content normal.    Assessment & Plan:  1: Chronic heart failure with preserved ejection fraction without structural changes- - NYHA class III - euvolemic today - weighing daily; instructed to call for an overnight weight gain of > 2 pounds or a weekly weight gain of >5 pounds - with diminished breath sounds in RLL, will order CXR today - has been taking furosemide 40mg  daily for the last 3 days so will not adjust it today - may benefit from pulmonology referral - says that she's unsure of how much fluid she drinks in a day but her husband says that it's "not much"; explained that she needed to drink at least 40 ounces of fluid daily unless nephrology gave her a lower limit - BNP 09/29/21 was 70.0  2: HTN with CKD- - BP elevated today (167/83) - saw nephrology Holley Raring) 10/08/21 - saw PCP (Gauger) 04/08/21; returns tomorrow - CMP 10/08/21 reviewed  and  showed sodium 139, potassium 4.8, creatinine 1.86 and GFR 29  3: Anxiety- - admits that when she feels SOB, her anxiety worsens which then makes her SOB worsen - currently on fluoxetine and mirtazapine  4: Lymphedema- - stage 2 - limited in her ability to exercise due to her shortness of breath - not elevating her legs much when she is sitting and she was  encouraged to elevate them when sitting for long periods of time - has compression socks at home and she was instructed to put them on every morning with removal at bedtime - consider lymphapress compression boots if edema persists   Medication bottles reviewed.   Return in 1 week or sooner for any questions/problems before then.

## 2021-10-12 ENCOUNTER — Ambulatory Visit
Admission: RE | Admit: 2021-10-12 | Discharge: 2021-10-12 | Disposition: A | Discharge status: Home / Self Care | Payer: Medicare HMO | Source: Ambulatory Visit | Attending: Family | Admitting: Family

## 2021-10-12 ENCOUNTER — Ambulatory Visit
Admission: RE | Admit: 2021-10-12 | Discharge: 2021-10-12 | Disposition: A | Discharge status: Home / Self Care | Payer: Medicare HMO | Attending: Family | Admitting: Family

## 2021-10-12 ENCOUNTER — Other Ambulatory Visit: Payer: Self-pay

## 2021-10-12 ENCOUNTER — Encounter: Payer: Self-pay | Admitting: Family

## 2021-10-12 ENCOUNTER — Ambulatory Visit (HOSPITAL_BASED_OUTPATIENT_CLINIC_OR_DEPARTMENT_OTHER): Payer: Medicare HMO | Admitting: Family

## 2021-10-12 VITALS — BP 167/83 | HR 94 | Resp 20 | Ht 64.0 in | Wt 228.4 lb

## 2021-10-12 DIAGNOSIS — R0602 Shortness of breath: Secondary | ICD-10-CM | POA: Diagnosis not present

## 2021-10-12 DIAGNOSIS — I890 Lymphedema, not elsewhere classified: Secondary | ICD-10-CM | POA: Insufficient documentation

## 2021-10-12 DIAGNOSIS — I5032 Chronic diastolic (congestive) heart failure: Secondary | ICD-10-CM | POA: Insufficient documentation

## 2021-10-12 DIAGNOSIS — F419 Anxiety disorder, unspecified: Secondary | ICD-10-CM | POA: Diagnosis not present

## 2021-10-12 DIAGNOSIS — I10 Essential (primary) hypertension: Secondary | ICD-10-CM

## 2021-10-12 DIAGNOSIS — I517 Cardiomegaly: Secondary | ICD-10-CM | POA: Diagnosis not present

## 2021-10-12 DIAGNOSIS — R059 Cough, unspecified: Secondary | ICD-10-CM | POA: Diagnosis not present

## 2021-10-12 DIAGNOSIS — J811 Chronic pulmonary edema: Secondary | ICD-10-CM | POA: Diagnosis not present

## 2021-10-12 NOTE — Patient Instructions (Addendum)
Continue weighing daily and call for an overnight weight gain of > 2 pounds or a weekly weight gain of >5 pounds.    Drink about 40 ounces of fluid daily.    Put compression socks on daily with removal at bedtime and prompt your legs up with sitting for long periods of time.

## 2021-10-13 DIAGNOSIS — I509 Heart failure, unspecified: Secondary | ICD-10-CM | POA: Diagnosis not present

## 2021-10-13 DIAGNOSIS — D509 Iron deficiency anemia, unspecified: Secondary | ICD-10-CM | POA: Diagnosis not present

## 2021-10-13 DIAGNOSIS — J9601 Acute respiratory failure with hypoxia: Secondary | ICD-10-CM | POA: Diagnosis not present

## 2021-10-13 DIAGNOSIS — F418 Other specified anxiety disorders: Secondary | ICD-10-CM | POA: Diagnosis not present

## 2021-10-13 DIAGNOSIS — J9600 Acute respiratory failure, unspecified whether with hypoxia or hypercapnia: Secondary | ICD-10-CM | POA: Diagnosis not present

## 2021-10-13 DIAGNOSIS — I272 Pulmonary hypertension, unspecified: Secondary | ICD-10-CM | POA: Diagnosis not present

## 2021-10-13 DIAGNOSIS — E538 Deficiency of other specified B group vitamins: Secondary | ICD-10-CM | POA: Diagnosis not present

## 2021-10-13 DIAGNOSIS — Z23 Encounter for immunization: Secondary | ICD-10-CM | POA: Diagnosis not present

## 2021-10-14 ENCOUNTER — Telehealth: Payer: Self-pay

## 2021-10-14 NOTE — Telephone Encounter (Signed)
Patient notified of chest xray results and no further changes to medication at this time per Darylene Price, NP. Patient will follow up in office on 10/23/21. Patient acknowledged and stated she had no questions.  Georg Ruddle, RN [facility]

## 2021-10-14 NOTE — Telephone Encounter (Signed)
-----   Message from Alisa Graff, [place] sent at 10/14/2021  9:36 AM EDT ----- Chest xray results showed similar congestion compared to previous xray. PCP just increased her furosemide so will not increase it any further at this time.

## 2021-10-21 DIAGNOSIS — I130 Hypertensive heart and chronic kidney disease with heart failure and stage 1 through stage 4 chronic kidney disease, or unspecified chronic kidney disease: Secondary | ICD-10-CM | POA: Diagnosis not present

## 2021-10-21 DIAGNOSIS — G479 Sleep disorder, unspecified: Secondary | ICD-10-CM | POA: Diagnosis not present

## 2021-10-21 DIAGNOSIS — G2581 Restless legs syndrome: Secondary | ICD-10-CM | POA: Diagnosis not present

## 2021-10-21 DIAGNOSIS — F418 Other specified anxiety disorders: Secondary | ICD-10-CM | POA: Diagnosis not present

## 2021-10-21 DIAGNOSIS — Z79899 Other long term (current) drug therapy: Secondary | ICD-10-CM | POA: Diagnosis not present

## 2021-10-21 DIAGNOSIS — E782 Mixed hyperlipidemia: Secondary | ICD-10-CM | POA: Diagnosis not present

## 2021-10-21 DIAGNOSIS — N184 Chronic kidney disease, stage 4 (severe): Secondary | ICD-10-CM | POA: Diagnosis not present

## 2021-10-21 DIAGNOSIS — Z0000 Encounter for general adult medical examination without abnormal findings: Secondary | ICD-10-CM | POA: Diagnosis not present

## 2021-10-22 NOTE — Progress Notes (Signed)
Patient ID: Tonya Cummings, female    DOB: [birth year], 70 y.o.   MRN: [id number]  HPI  [name] is a 70 y/o female with a history of hyperlipidemia, HTN, CKD, anxiety, chronic pain, depression, GERD, anemia, restless leg syndrome, obstructive sleep apnea and chronic heart failure.   Echo report from 09/30/21 reviewed and showed an EF of 55-60% along with mild MR but without LVH/LAE.   Admitted 09/29/21 due to worsening shortness of breath. Initially placed on lasix gtt with transition to oral diuretics. Weaned off supplemental oxygen. V/Q scan negative for PE. Discharged after 3 days.  She presents today for a follow-up visit with a chief complaint of moderate shortness of breath with very little exertion. She describes this as having been present for several months. She has associated fatigue, cough, pedal edema, light-headedness, difficulty sleeping and anxiety along with this. She denies any abdominal distention, palpitations or chest pain.   She is now taking furosemide 60mg  daily since 10/13/21. She did notice that she gained 3 pounds overnight and doesn't think she ate anything salty or drank too much fluids. If anything, she says that she may not have drank enough fluids but has been trying to drink 40 ounces/ day.   Is walking as much as she can and also started riding her stationary bike for ~ 10 minutes daily. Is waiting to hear from pulmonology as her PCP placed a referral to them on 10/13/21.   Past Medical History:  Diagnosis Date   Anxiety    CHF (congestive heart failure) (HCC)    Chronic kidney disease    Chronic pain    Chronic radicular pain of lower back    Costochondritis    Depression    Encounter for blood transfusion    GERD (gastroesophageal reflux disease)    Headache    migraines   Hip dysplasia    Hyperlipidemia    Hypertension    Iron deficiency anemia    Low back pain    Low vitamin B12 level    Obesity    Osteoporosis    Pelvic fracture (HCC)     Raynaud's disease without gangrene    Restless leg syndrome    Rheumatoid aortitis    Rheumatoid arthritis (HCC)    polyarthritis   Rheumatoid arthritis (HCC)    Seasonal allergies    Sleep disorder    Thoracic compression fracture ([place])    Past Surgical History:  Procedure Laterality Date   ABDOMINAL SURGERY     pt denies   APPENDECTOMY     CARPAL TUNNEL RELEASE Bilateral    CHOLECYSTECTOMY     COLONOSCOPY WITH PROPOFOL N/A 08/15/2020   Procedure: COLONOSCOPY WITH PROPOFOL;  Surgeon: Robert Bellow, MD;  Location: [place];  Service: Endoscopy;  Laterality: N/A;   DILATION AND CURETTAGE OF UTERUS     ESOPHAGOGASTRODUODENOSCOPY (EGD) WITH PROPOFOL N/A 08/15/2020   Procedure: ESOPHAGOGASTRODUODENOSCOPY (EGD) WITH PROPOFOL;  Surgeon: Robert Bellow, MD;  Location: [facility];  Service: Endoscopy;  Laterality: N/A;   FRACTURE SURGERY     hip fracture    FRACTURE SURGERY     pelvic fracture plate    HIP SURGERY Left    KNEE ARTHROPLASTY Right 10/13/2020   Procedure: COMPUTER ASSISTED TOTAL KNEE ARTHROPLASTY - RNFA;  Surgeon: Dereck Leep, MD;  Location: [facility];  Service: Orthopedics;  Laterality: Right;   TUBAL LIGATION     Family History  Problem Relation Age of Onset   Diabetes  Mother    Hypertension Mother    Aneurysm Father    Diabetes Son    Seizures Son    Osteosarcoma Brother    Cancer Brother    Diabetes Brother    Diabetes Maternal Grandfather    Heart disease Maternal Grandfather    Social History   Tobacco Use   Smoking status: Never   Smokeless tobacco: Never  Substance Use Topics   Alcohol use: No   Allergies  Allergen Reactions   Gabapentin Other (See Comments)    Weight gain   Ibuprofen Other (See Comments)    Headache   Prior to Admission medications   Medication Sig Start Date End Date Taking? Authorizing Provider  Acetaminophen-Caffeine (EXCEDRIN TENSION HEADACHE) 500-65 MG TABS Take 1 tablet by mouth daily as needed  (Headache).   Yes [provider]  albuterol (VENTOLIN HFA) 108 (90 Base) MCG/ACT inhaler Inhale 2 puffs into the lungs every 4 (four) hours as needed. 09/23/21  Yes Margarette Canada, NP  calcitRIOL (ROCALTROL) 0.25 MCG capsule Take by mouth. 05/22/21  Yes [provider]  Calcium Carb-Cholecalciferol 600-400 MG-UNIT CAPS Take 1 capsule by mouth 2 (two) times daily. 01/13/10  Yes [provider]  Cholecalciferol 50 MCG (2000 UT) CAPS Take 2,000 Units by mouth daily. 09/23/09  Yes [provider]  cyclobenzaprine (FLEXERIL) 10 MG tablet Take 10 mg by mouth at bedtime.   Yes [provider]  ferrous sulfate 325 (65 FE) MG tablet Take 325 mg by mouth daily with breakfast.   Yes [provider]  FLUoxetine (PROZAC) 40 MG capsule Take 40 mg by mouth daily.   Yes [provider]  folic acid (FOLVITE) 1 MG tablet Take 1 mg by mouth daily.   Yes [provider]  furosemide (LASIX) 20 MG tablet Take 1 tablet (20 mg total) by mouth daily. Patient taking differently: Take 60 mg by mouth daily. 10/02/21 11/01/21 Yes Wyvonnia Dusky, MD  hydroxychloroquine (PLAQUENIL) 200 MG tablet Take 1 tablet by mouth 2 (two) times daily. 06/22/21  Yes [provider]  methotrexate (50 MG/ML) 1 g injection Inject 25 mg into the vein once a week. .6 ml   Yes [provider]  mirtazapine (REMERON) 30 MG tablet  05/12/21  Yes [provider]  Multiple Vitamins-Minerals (MULTIVITAMIN ADULT PO) Take 1 tablet by mouth daily.  09/10/08  Yes [provider]  pramipexole (MIRAPEX) 0.125 MG tablet Take 0.125 mg by mouth daily. Take 0.125 mg in the morning and 2 at bedtime 02/15/18  Yes [provider]  Propylene Glycol 0.6 % SOLN Apply to eye.   Yes [provider]  rosuvastatin (CRESTOR) 5 MG tablet Take 5 mg by mouth every other day.  07/18/19  Yes [provider]  spironolactone (ALDACTONE) 25 MG tablet   04/09/21  Yes [provider]  vitamin B-12 (CYANOCOBALAMIN) 500 MCG tablet Take 500 mcg by mouth daily.   Yes [provider]  Adalimumab 40 MG/0.4ML PSKT Inject 40 mg into the skin every 14 (fourteen) days. Patient not taking: Reported on 10/23/2021    [provider]   Review of Systems  Constitutional:  Positive for fatigue (easily). Negative for appetite change.  HENT:  Positive for voice change. Negative for congestion, postnasal drip and sore throat.   Eyes: Negative.   Respiratory:  Positive for cough and shortness of breath (easily). Negative for chest tightness.   Cardiovascular:  Positive for leg swelling. Negative for chest pain  and palpitations.  Gastrointestinal:  Negative for abdominal distention and abdominal pain.  Endocrine: Negative.   Genitourinary: Negative.   Musculoskeletal:  Positive for arthralgias (right shoulder/ feet). Negative for back pain.  Skin: Negative.   Allergic/Immunologic: Negative.   Neurological:  Positive for light-headedness. Negative for dizziness.  Hematological:  Negative for adenopathy. Does not bruise/bleed easily.  Psychiatric/Behavioral:  Positive for sleep disturbance (sleeping in recliner due to PND). Negative for dysphoric mood. The patient is nervous/anxious.    Vitals:   10/23/21 0937  BP: (!) 116/53  Pulse: 96  Resp: (!) 22  SpO2: 96%  Weight: 222 lb (100.7 kg)  Height: 5\' 3"  (1.6 m)   Wt Readings from Last 3 Encounters:  10/23/21 222 lb (100.7 kg)  10/12/21 228 lb 6 oz (103.6 kg)  10/02/21 218 lb 0.6 oz (98.9 kg)   Lab Results  Component Value Date   CREATININE 2.40 (H) 10/02/2021   CREATININE 2.42 (H) 10/01/2021   CREATININE 1.82 (H) 09/30/2021    Physical Exam Vitals and nursing note reviewed.  Constitutional:      Appearance: Normal appearance.  HENT:     Head: Normocephalic and atraumatic.  Cardiovascular:     Rate and Rhythm: Normal rate and regular rhythm.  Pulmonary:     Effort:  Pulmonary effort is normal. Tachypnea present. No respiratory distress.     Breath sounds: No wheezing or rales.  Abdominal:     General: There is no distension.     Palpations: Abdomen is soft.     Tenderness: There is no abdominal tenderness.  Musculoskeletal:        General: No tenderness.     Cervical back: Normal range of motion and neck supple.     Right lower leg: Edema (1+ pitting) present.     Left lower leg: Edema (1+ pitting) present.  Skin:    General: Skin is warm and dry.  Neurological:     General: No focal deficit present.     Mental Status: She is alert and oriented to person, place, and time.  Psychiatric:        Mood and Affect: Mood is anxious.        Behavior: Behavior normal.        Thought Content: Thought content normal.    Assessment & Plan:  1: Chronic heart failure with preserved ejection fraction without structural changes- - NYHA class III - euvolemic today - weighing daily; instructed to call for an overnight weight gain of > 2 pounds or a weekly weight gain of >5 pounds; gained 3 pounds overnight on her scale - advised her that should her home weight remain the same tomorrow, to go ahead and take an additional 20mg  furosemide.  - weight down 6 pounds from last visit here 2 weeks ago - PCP placed pulmonology referral on 10/13/21; phone # provided on her AVS and encouraged patient to call them - has been walking some (walked to the office today) and started riding her stationary bike at home for ~ 10 minutes/ day - has been trying to drink 40 ounces of fluid / day (was previously drinking very little) but says that she was busy yesterday and doesn't think she even drank that much - BNP 09/29/21 was 70.0  2: HTN with CKD- - BP looks good (116/53) - saw nephrology Holley Raring) 10/08/21; returns Dec  - saw PCP (Gauger) 10/21/21 - CMP 10/13/21 reviewed and showed sodium 140, potassium 5.3, creatinine 2.1 and GFR 23  3: Anxiety- - admits that when she feels  SOB, her anxiety worsens which then makes her SOB worsen - encouraged her to try and slow her breathing down when she starts to feel short of breath - currently on fluoxetine and mirtazapine  4: Lymphedema- - stage 2 - limited in her ability to exercise due to her shortness of breath - not elevating her legs much when she is sitting and she was  encouraged to elevate them when sitting for long periods of time - wearing compression socks daily with slight improvement of edema - consider lymphapress compression boots if edema persists   Medication bottles reviewed.   Return in 6 weeks or sooner for any questions/problems before then.

## 2021-10-23 ENCOUNTER — Other Ambulatory Visit: Payer: Self-pay

## 2021-10-23 ENCOUNTER — Encounter: Payer: Self-pay | Admitting: Family

## 2021-10-23 ENCOUNTER — Ambulatory Visit: Payer: Medicare HMO | Attending: Family | Admitting: Family

## 2021-10-23 VITALS — BP 116/53 | HR 96 | Resp 22 | Ht 63.0 in | Wt 222.0 lb

## 2021-10-23 DIAGNOSIS — I5032 Chronic diastolic (congestive) heart failure: Secondary | ICD-10-CM | POA: Insufficient documentation

## 2021-10-23 DIAGNOSIS — G4733 Obstructive sleep apnea (adult) (pediatric): Secondary | ICD-10-CM | POA: Insufficient documentation

## 2021-10-23 DIAGNOSIS — I890 Lymphedema, not elsewhere classified: Secondary | ICD-10-CM | POA: Insufficient documentation

## 2021-10-23 DIAGNOSIS — E785 Hyperlipidemia, unspecified: Secondary | ICD-10-CM | POA: Insufficient documentation

## 2021-10-23 DIAGNOSIS — I10 Essential (primary) hypertension: Secondary | ICD-10-CM | POA: Diagnosis not present

## 2021-10-23 DIAGNOSIS — F419 Anxiety disorder, unspecified: Secondary | ICD-10-CM | POA: Diagnosis not present

## 2021-10-23 DIAGNOSIS — N189 Chronic kidney disease, unspecified: Secondary | ICD-10-CM | POA: Diagnosis not present

## 2021-10-23 DIAGNOSIS — I130 Hypertensive heart and chronic kidney disease with heart failure and stage 1 through stage 4 chronic kidney disease, or unspecified chronic kidney disease: Secondary | ICD-10-CM | POA: Diagnosis not present

## 2021-10-23 DIAGNOSIS — K219 Gastro-esophageal reflux disease without esophagitis: Secondary | ICD-10-CM | POA: Insufficient documentation

## 2021-10-23 DIAGNOSIS — Z79899 Other long term (current) drug therapy: Secondary | ICD-10-CM | POA: Diagnosis not present

## 2021-10-23 DIAGNOSIS — D649 Anemia, unspecified: Secondary | ICD-10-CM | POA: Diagnosis not present

## 2021-10-23 DIAGNOSIS — G2581 Restless legs syndrome: Secondary | ICD-10-CM | POA: Diagnosis not present

## 2021-10-23 DIAGNOSIS — F32A Depression, unspecified: Secondary | ICD-10-CM | POA: Insufficient documentation

## 2021-10-23 DIAGNOSIS — G8929 Other chronic pain: Secondary | ICD-10-CM | POA: Insufficient documentation

## 2021-10-23 NOTE — Patient Instructions (Addendum)
Continue weighing daily and call for an overnight weight gain of > 2 pounds or a weekly weight gain of >5 pounds.    Please call [facility] pulmonology at [phone] to follow-up on the referral that your primary care doctor made on 10/13/21

## 2021-10-26 DIAGNOSIS — U071 COVID-19: Secondary | ICD-10-CM | POA: Diagnosis not present

## 2021-10-26 DIAGNOSIS — R0600 Dyspnea, unspecified: Secondary | ICD-10-CM | POA: Diagnosis not present

## 2021-10-27 ENCOUNTER — Other Ambulatory Visit: Payer: Self-pay

## 2021-10-27 NOTE — Patient Outreach (Signed)
[facility]) Care Management  10/27/2021  Tonya Cummings 1951-01-22 [id number]   Referral Date: 10/06/21 Referral Source: Hospital Liaison Referral Reason: Recent hospitalization CHF Discharge 10/02/21     Outreach Attempt: No answer. HIPAA compliant voice message left.       Plan: RN CM will close case.   Jone Baseman, RN, MSN [place] Management Care Management Coordinator Direct Line [phone] Cell [phone] Toll Free: [phone]  Fax: [phone]

## 2021-11-04 DIAGNOSIS — I509 Heart failure, unspecified: Secondary | ICD-10-CM | POA: Diagnosis not present

## 2021-11-04 DIAGNOSIS — N184 Chronic kidney disease, stage 4 (severe): Secondary | ICD-10-CM | POA: Diagnosis not present

## 2021-11-04 DIAGNOSIS — Z79899 Other long term (current) drug therapy: Secondary | ICD-10-CM | POA: Diagnosis not present

## 2021-11-30 NOTE — Progress Notes (Signed)
Patient ID: Tonya Cummings, female    DOB: December 17, 1950, 69 y.o.   MRN: [id number]  HPI  Tonya Cummings is a 70 y/o female with a history of hyperlipidemia, HTN, CKD, anxiety, chronic pain, depression, GERD, anemia, restless leg syndrome, obstructive sleep apnea and chronic heart failure.   Echo report from 09/30/21 reviewed and showed an EF of 55-60% along with mild MR but without LVH/LAE.   Admitted 09/29/21 due to worsening shortness of breath. Initially placed on lasix gtt with transition to oral diuretics. Weaned off supplemental oxygen. V/Q scan negative for PE. Discharged after 3 days.  She presents today for a follow-up visit with a chief complaint of moderate fatigue with little exertion. She describes this as chronic in nature having been present for several years. She has associated shortness of breath, pedal edema, light-headedness, anxiety and chronic difficulty sleeping along with this. She denies any abdominal distention, palpitations, chest pain, cough or weight gain.   Says that she wears compression socks every day but swelling persists.   Past Medical History:  Diagnosis Date   Anxiety    CHF (congestive heart failure) (HCC)    Chronic kidney disease    Chronic pain    Chronic radicular pain of lower back    Costochondritis    Depression    Encounter for blood transfusion    GERD (gastroesophageal reflux disease)    Headache    migraines   Hip dysplasia    Hyperlipidemia    Hypertension    Iron deficiency anemia    Low back pain    Low vitamin B12 level    Obesity    Osteoporosis    Pelvic fracture (HCC)    Raynaud's disease without gangrene    Restless leg syndrome    Rheumatoid aortitis    Rheumatoid arthritis (HCC)    polyarthritis   Rheumatoid arthritis (HCC)    Seasonal allergies    Sleep disorder    Thoracic compression fracture ([place])    Past Surgical History:  Procedure Laterality Date   ABDOMINAL SURGERY     pt denies   APPENDECTOMY      CARPAL TUNNEL RELEASE Bilateral    CHOLECYSTECTOMY     COLONOSCOPY WITH PROPOFOL N/A 08/15/2020   Procedure: COLONOSCOPY WITH PROPOFOL;  Surgeon: Robert Bellow, MD;  Location: [facility];  Service: Endoscopy;  Laterality: N/A;   DILATION AND CURETTAGE OF UTERUS     ESOPHAGOGASTRODUODENOSCOPY (EGD) WITH PROPOFOL N/A 08/15/2020   Procedure: ESOPHAGOGASTRODUODENOSCOPY (EGD) WITH PROPOFOL;  Surgeon: Robert Bellow, MD;  Location: [facility];  Service: Endoscopy;  Laterality: N/A;   FRACTURE SURGERY     hip fracture    FRACTURE SURGERY     pelvic fracture plate    HIP SURGERY Left    KNEE ARTHROPLASTY Right 10/13/2020   Procedure: COMPUTER ASSISTED TOTAL KNEE ARTHROPLASTY - RNFA;  Surgeon: Dereck Leep, MD;  Location: [facility];  Service: Orthopedics;  Laterality: Right;   TUBAL LIGATION     Family History  Problem Relation Age of Onset   Diabetes Mother    Hypertension Mother    Aneurysm Father    Diabetes Son    Seizures Son    Osteosarcoma Brother    Cancer Brother    Diabetes Brother    Diabetes Maternal Grandfather    Heart disease Maternal Grandfather    Social History   Tobacco Use   Smoking status: Never   Smokeless tobacco: Never  Substance Use Topics  Alcohol use: No   Allergies  Allergen Reactions   Gabapentin Other (See Comments)    Weight gain   Ibuprofen Other (See Comments)    Headache   Prior to Admission medications   Medication Sig Start Date End Date Taking? Authorizing Provider  Acetaminophen-Caffeine (EXCEDRIN TENSION HEADACHE) 500-65 MG TABS Take 1 tablet by mouth daily as needed (Headache).   Yes [provider]  albuterol (VENTOLIN HFA) 108 (90 Base) MCG/ACT inhaler Inhale 2 puffs into the lungs every 4 (four) hours as needed. 09/23/21  Yes Margarette Canada, NP  calcitRIOL (ROCALTROL) 0.25 MCG capsule Take by mouth. 05/22/21  Yes [provider]  Calcium Carb-Cholecalciferol 600-400 MG-UNIT CAPS Take 1 capsule by mouth 2  (two) times daily. 01/13/10  Yes [provider]  Cholecalciferol 50 MCG (2000 UT) CAPS Take 2,000 Units by mouth daily. 09/23/09  Yes [provider]  cyclobenzaprine (FLEXERIL) 10 MG tablet Take 10 mg by mouth at bedtime.   Yes [provider]  enalapril-hydrochlorothiazide (VASERETIC) 10-25 MG tablet Take 1 tablet by mouth daily.   Yes [provider]  ferrous sulfate 325 (65 FE) MG tablet Take 325 mg by mouth daily with breakfast.   Yes [provider]  FLUoxetine (PROZAC) 40 MG capsule Take 40 mg by mouth daily.   Yes [provider]  folic acid (FOLVITE) 1 MG tablet Take 1 mg by mouth daily.   Yes [provider]  furosemide (LASIX) 20 MG tablet Take 1 tablet (20 mg total) by mouth daily. Patient taking differently: Take 60 mg by mouth daily. 10/02/21  Yes Wyvonnia Dusky, MD  hydroxychloroquine (PLAQUENIL) 200 MG tablet Take 1 tablet by mouth 2 (two) times daily. 06/22/21  Yes [provider]  methotrexate (50 MG/ML) 1 g injection Inject 25 mg into the vein once a week. .6 ml   Yes [provider]  mirtazapine (REMERON) 30 MG tablet  05/12/21  Yes [provider]  Multiple Vitamins-Minerals (MULTIVITAMIN ADULT PO) Take 1 tablet by mouth daily.  09/10/08  Yes [provider]  omeprazole (PRILOSEC) 40 MG capsule Take 40 mg by mouth daily.   Yes [provider]  pramipexole (MIRAPEX) 0.125 MG tablet Take 0.125 mg by mouth daily. Take 0.125 mg in the morning and 2 at bedtime 02/15/18  Yes [provider]  Propylene Glycol 0.6 % SOLN Apply to eye.   Yes [provider]  rosuvastatin (CRESTOR) 5 MG tablet Take 5 mg by mouth every other day.  07/18/19  Yes [provider]  spironolactone (ALDACTONE) 25 MG tablet  04/09/21  Yes [provider]  vitamin B-12 (CYANOCOBALAMIN) 500 MCG tablet Take 500 mcg by mouth daily.   Yes [provider]  Adalimumab 40  MG/0.4ML PSKT Inject 40 mg into the skin every 14 (fourteen) days. Patient not taking: Reported on 10/23/2021    [provider]    Review of Systems  Constitutional:  Positive for fatigue (easily). Negative for appetite change.  HENT:  Positive for voice change. Negative for congestion, postnasal drip and sore throat.   Eyes: Negative.   Respiratory:  Positive for shortness of breath (easily). Negative for cough and chest tightness.   Cardiovascular:  Positive for leg swelling. Negative for chest pain and palpitations.  Gastrointestinal:  Negative for abdominal distention and abdominal pain.  Endocrine: Negative.   Genitourinary: Negative.   Musculoskeletal:  Positive for arthralgias (right shoulder/ feet). Negative for back pain.  Skin: Negative.  Allergic/Immunologic: Negative.   Neurological:  Positive for light-headedness. Negative for dizziness.  Hematological:  Negative for adenopathy. Does not bruise/bleed easily.  Psychiatric/Behavioral:  Positive for sleep disturbance (sleeping in recliner due to PND). Negative for dysphoric mood. The patient is nervous/anxious.    Vitals:   12/01/21 1003  BP: (!) 151/58  Pulse: 89  Resp: 20  SpO2: 99%  Weight: 214 lb 2 oz (97.1 kg)  Height: 5\' 3"  (1.6 m)   Wt Readings from Last 3 Encounters:  12/01/21 214 lb 2 oz (97.1 kg)  10/23/21 222 lb (100.7 kg)  10/12/21 228 lb 6 oz (103.6 kg)   Lab Results  Component Value Date   CREATININE 2.40 (H) 10/02/2021   CREATININE 2.42 (H) 10/01/2021   CREATININE 1.82 (H) 09/30/2021   Physical Exam Vitals and nursing note reviewed.  Constitutional:      Appearance: Normal appearance.  HENT:     Head: Normocephalic and atraumatic.  Cardiovascular:     Rate and Rhythm: Normal rate and regular rhythm.  Pulmonary:     Effort: Pulmonary effort is normal. No respiratory distress.     Breath sounds: No wheezing or rales.  Abdominal:     General: There is no distension.     Palpations:  Abdomen is soft.     Tenderness: There is no abdominal tenderness.  Musculoskeletal:        General: No tenderness.     Cervical back: Normal range of motion and neck supple.     Right lower leg: Edema (1+ pitting) present.     Left lower leg: Edema (1+ pitting) present.  Skin:    General: Skin is warm and dry.  Neurological:     General: No focal deficit present.     Mental Status: She is alert and oriented to person, place, and time.  Psychiatric:        Mood and Affect: Mood is anxious.        Behavior: Behavior normal.        Thought Content: Thought content normal.    Assessment & Plan:  1: Chronic heart failure with preserved ejection fraction without structural changes- - NYHA class III - euvolemic today - weighing daily; reminded to call for an overnight weight gain of > 2 pounds or a weekly weight gain of >5 pounds; gained 3 pounds overnight on her scale - weight down 8 pounds from last visit here 1 month ago - has been walking some (walked to the office today) and started riding her stationary bike at home for ~ 10 minutes/ day - has been trying to drink 40 ounces of fluid / day (was previously drinking very little)  - BNP 09/29/21 was 70.0  2: HTN with CKD- - BP mildly elevated (151/58) - saw nephrology Holley Raring) 10/08/21 - saw PCP (Gauger) 10/21/21 - [place] 10/13/21 reviewed and showed sodium 140, potassium 5.3, creatinine 2.1 and GFR 23  3: Anxiety- - admits that when she feels SOB, her anxiety worsens which then makes her SOB worsen - encouraged her to try and slow her breathing down when she starts to feel short of breath - currently on fluoxetine and mirtazapine  4: Lymphedema- - stage 2 - limited in her ability to exercise due to her shortness of breath - encouraged to elevate them when sitting for long periods of time - wearing compression socks daily but edema persists - will make referral for compression boots and patient is agreeable to this  5: Sleep  apnea- - saw pulmonology Lanney Gins) 10/26/21   Medication bottles reviewed.   Return in 3 months or sooner for any questions/problems before then.

## 2021-12-01 ENCOUNTER — Encounter: Payer: Self-pay | Admitting: Family

## 2021-12-01 ENCOUNTER — Other Ambulatory Visit: Payer: Self-pay

## 2021-12-01 ENCOUNTER — Ambulatory Visit: Payer: Medicare HMO | Attending: Family | Admitting: Family

## 2021-12-01 VITALS — BP 151/58 | HR 89 | Resp 20 | Ht 63.0 in | Wt 214.1 lb

## 2021-12-01 DIAGNOSIS — F419 Anxiety disorder, unspecified: Secondary | ICD-10-CM | POA: Diagnosis not present

## 2021-12-01 DIAGNOSIS — I5032 Chronic diastolic (congestive) heart failure: Secondary | ICD-10-CM | POA: Diagnosis not present

## 2021-12-01 DIAGNOSIS — R42 Dizziness and giddiness: Secondary | ICD-10-CM | POA: Diagnosis not present

## 2021-12-01 DIAGNOSIS — G8929 Other chronic pain: Secondary | ICD-10-CM | POA: Diagnosis not present

## 2021-12-01 DIAGNOSIS — N189 Chronic kidney disease, unspecified: Secondary | ICD-10-CM | POA: Diagnosis not present

## 2021-12-01 DIAGNOSIS — Z79899 Other long term (current) drug therapy: Secondary | ICD-10-CM | POA: Diagnosis not present

## 2021-12-01 DIAGNOSIS — I890 Lymphedema, not elsewhere classified: Secondary | ICD-10-CM | POA: Insufficient documentation

## 2021-12-01 DIAGNOSIS — E785 Hyperlipidemia, unspecified: Secondary | ICD-10-CM | POA: Diagnosis not present

## 2021-12-01 DIAGNOSIS — K219 Gastro-esophageal reflux disease without esophagitis: Secondary | ICD-10-CM | POA: Diagnosis not present

## 2021-12-01 DIAGNOSIS — R5383 Other fatigue: Secondary | ICD-10-CM | POA: Diagnosis not present

## 2021-12-01 DIAGNOSIS — G2581 Restless legs syndrome: Secondary | ICD-10-CM | POA: Diagnosis not present

## 2021-12-01 DIAGNOSIS — I10 Essential (primary) hypertension: Secondary | ICD-10-CM | POA: Diagnosis not present

## 2021-12-01 DIAGNOSIS — R0602 Shortness of breath: Secondary | ICD-10-CM | POA: Diagnosis not present

## 2021-12-01 DIAGNOSIS — G4733 Obstructive sleep apnea (adult) (pediatric): Secondary | ICD-10-CM

## 2021-12-01 DIAGNOSIS — D649 Anemia, unspecified: Secondary | ICD-10-CM | POA: Insufficient documentation

## 2021-12-01 DIAGNOSIS — I130 Hypertensive heart and chronic kidney disease with heart failure and stage 1 through stage 4 chronic kidney disease, or unspecified chronic kidney disease: Secondary | ICD-10-CM | POA: Diagnosis not present

## 2021-12-01 DIAGNOSIS — F32A Depression, unspecified: Secondary | ICD-10-CM | POA: Insufficient documentation

## 2021-12-01 NOTE — Patient Instructions (Signed)
Continue weighing daily and call for an overnight weight gain of 3 pounds or more or a weekly weight gain of more than 5 pounds.  °

## 2021-12-09 DIAGNOSIS — Z79899 Other long term (current) drug therapy: Secondary | ICD-10-CM | POA: Diagnosis not present

## 2021-12-09 DIAGNOSIS — M059 Rheumatoid arthritis with rheumatoid factor, unspecified: Secondary | ICD-10-CM | POA: Diagnosis not present

## 2021-12-09 DIAGNOSIS — N1832 Chronic kidney disease, stage 3b: Secondary | ICD-10-CM | POA: Diagnosis not present

## 2021-12-24 DIAGNOSIS — I509 Heart failure, unspecified: Secondary | ICD-10-CM | POA: Diagnosis not present

## 2021-12-24 DIAGNOSIS — Z01818 Encounter for other preprocedural examination: Secondary | ICD-10-CM | POA: Diagnosis not present

## 2021-12-24 DIAGNOSIS — R0602 Shortness of breath: Secondary | ICD-10-CM | POA: Diagnosis not present

## 2021-12-25 DIAGNOSIS — G4733 Obstructive sleep apnea (adult) (pediatric): Secondary | ICD-10-CM | POA: Diagnosis not present

## 2021-12-30 ENCOUNTER — Other Ambulatory Visit: Payer: Self-pay | Admitting: *Deleted

## 2021-12-30 DIAGNOSIS — D509 Iron deficiency anemia, unspecified: Secondary | ICD-10-CM

## 2022-01-06 ENCOUNTER — Inpatient Hospital Stay (HOSPITAL_BASED_OUTPATIENT_CLINIC_OR_DEPARTMENT_OTHER): Payer: Medicare HMO | Admitting: Oncology

## 2022-01-06 ENCOUNTER — Ambulatory Visit: Payer: Medicare HMO | Admitting: Oncology

## 2022-01-06 ENCOUNTER — Other Ambulatory Visit: Payer: Medicare HMO

## 2022-01-06 ENCOUNTER — Encounter: Payer: Self-pay | Admitting: Oncology

## 2022-01-06 ENCOUNTER — Other Ambulatory Visit: Payer: Self-pay

## 2022-01-06 ENCOUNTER — Inpatient Hospital Stay: Payer: Medicare HMO | Attending: Oncology

## 2022-01-06 VITALS — BP 126/65 | HR 89 | Temp 98.8°F | Resp 16 | Ht 63.0 in | Wt 209.3 lb

## 2022-01-06 DIAGNOSIS — N184 Chronic kidney disease, stage 4 (severe): Secondary | ICD-10-CM | POA: Insufficient documentation

## 2022-01-06 DIAGNOSIS — R778 Other specified abnormalities of plasma proteins: Secondary | ICD-10-CM

## 2022-01-06 DIAGNOSIS — Z9049 Acquired absence of other specified parts of digestive tract: Secondary | ICD-10-CM | POA: Diagnosis not present

## 2022-01-06 DIAGNOSIS — Z8249 Family history of ischemic heart disease and other diseases of the circulatory system: Secondary | ICD-10-CM | POA: Insufficient documentation

## 2022-01-06 DIAGNOSIS — E611 Iron deficiency: Secondary | ICD-10-CM | POA: Insufficient documentation

## 2022-01-06 DIAGNOSIS — R5383 Other fatigue: Secondary | ICD-10-CM | POA: Insufficient documentation

## 2022-01-06 DIAGNOSIS — Z820 Family history of epilepsy and other diseases of the nervous system: Secondary | ICD-10-CM | POA: Insufficient documentation

## 2022-01-06 DIAGNOSIS — D638 Anemia in other chronic diseases classified elsewhere: Secondary | ICD-10-CM

## 2022-01-06 DIAGNOSIS — Z833 Family history of diabetes mellitus: Secondary | ICD-10-CM | POA: Diagnosis not present

## 2022-01-06 DIAGNOSIS — Z8269 Family history of other diseases of the musculoskeletal system and connective tissue: Secondary | ICD-10-CM | POA: Insufficient documentation

## 2022-01-06 DIAGNOSIS — M069 Rheumatoid arthritis, unspecified: Secondary | ICD-10-CM | POA: Diagnosis not present

## 2022-01-06 DIAGNOSIS — I509 Heart failure, unspecified: Secondary | ICD-10-CM | POA: Insufficient documentation

## 2022-01-06 DIAGNOSIS — D631 Anemia in chronic kidney disease: Secondary | ICD-10-CM | POA: Diagnosis not present

## 2022-01-06 DIAGNOSIS — D122 Benign neoplasm of ascending colon: Secondary | ICD-10-CM | POA: Diagnosis not present

## 2022-01-06 DIAGNOSIS — Z886 Allergy status to analgesic agent status: Secondary | ICD-10-CM | POA: Diagnosis not present

## 2022-01-06 DIAGNOSIS — Z808 Family history of malignant neoplasm of other organs or systems: Secondary | ICD-10-CM | POA: Insufficient documentation

## 2022-01-06 DIAGNOSIS — K5730 Diverticulosis of large intestine without perforation or abscess without bleeding: Secondary | ICD-10-CM | POA: Insufficient documentation

## 2022-01-06 DIAGNOSIS — N183 Chronic kidney disease, stage 3 unspecified: Secondary | ICD-10-CM | POA: Diagnosis not present

## 2022-01-06 DIAGNOSIS — Z888 Allergy status to other drugs, medicaments and biological substances status: Secondary | ICD-10-CM | POA: Diagnosis not present

## 2022-01-06 DIAGNOSIS — I130 Hypertensive heart and chronic kidney disease with heart failure and stage 1 through stage 4 chronic kidney disease, or unspecified chronic kidney disease: Secondary | ICD-10-CM | POA: Diagnosis not present

## 2022-01-06 DIAGNOSIS — R0602 Shortness of breath: Secondary | ICD-10-CM | POA: Diagnosis not present

## 2022-01-06 DIAGNOSIS — Z79899 Other long term (current) drug therapy: Secondary | ICD-10-CM | POA: Insufficient documentation

## 2022-01-06 DIAGNOSIS — D509 Iron deficiency anemia, unspecified: Secondary | ICD-10-CM

## 2022-01-06 LAB — CBC WITH DIFFERENTIAL/PLATELET
Abs Immature Granulocytes: 0.06 10*3/uL (ref 0.00–0.07)
Basophils Absolute: 0.1 10*3/uL (ref 0.0–0.1)
Basophils Relative: 1 %
Eosinophils Absolute: 0.2 10*3/uL (ref 0.0–0.5)
Eosinophils Relative: 2 %
HCT: 33.3 % — ABNORMAL LOW (ref 36.0–46.0)
Hemoglobin: 10.6 g/dL — ABNORMAL LOW (ref 12.0–15.0)
Immature Granulocytes: 1 %
Lymphocytes Relative: 21 %
Lymphs Abs: 2 10*3/uL (ref 0.7–4.0)
MCH: 29.4 pg (ref 26.0–34.0)
MCHC: 31.8 g/dL (ref 30.0–36.0)
MCV: 92.2 fL (ref 80.0–100.0)
Monocytes Absolute: 1.1 10*3/uL — ABNORMAL HIGH (ref 0.1–1.0)
Monocytes Relative: 12 %
Neutro Abs: 6 10*3/uL (ref 1.7–7.7)
Neutrophils Relative %: 63 %
Platelets: 311 10*3/uL (ref 150–400)
RBC: 3.61 MIL/uL — ABNORMAL LOW (ref 3.87–5.11)
RDW: 15 % (ref 11.5–15.5)
WBC: 9.4 10*3/uL (ref 4.0–10.5)
nRBC: 0 % (ref 0.0–0.2)

## 2022-01-06 LAB — IRON AND TIBC
Iron: 61 ug/dL (ref 28–170)
Saturation Ratios: 17 % (ref 10.4–31.8)
TIBC: 351 ug/dL (ref 250–450)
UIBC: 290 ug/dL

## 2022-01-06 LAB — FERRITIN: Ferritin: 42 ng/mL (ref 11–307)

## 2022-01-06 LAB — VITAMIN B12: Vitamin B-12: 617 pg/mL (ref 180–914)

## 2022-01-06 LAB — FOLATE: Folate: >100 ng/mL (ref >5.9)

## 2022-01-06 NOTE — Progress Notes (Signed)
Hematology/Oncology Consult note [facility]  Telephone:([phone] [phone]  Patient Care Team: [name], NP as PCP - General (Internal Medicine) Candis Shine, MD as Referring Physician (Gastroenterology) Marlowe Sax, MD as Referring Physician (Internal Medicine) Sindy Guadeloupe, MD as Consulting Physician (Hematology and Oncology)   Name of the patient: Tonya Cummings  [id number]  Jul 26, 1951   Date of visit: 01/06/22  Diagnosis- anemia likely multifactorial secondary to iron deficiency as well as component of anemia of chronic disease from rheumatoid arthritis and anemia of chronic kidney disease  Chief complaint/ Reason for visit-routine follow-up of anemia  Heme/Onc history: Patient is a 71 year old female with a history of seropositive rheumatoid arthritis for which she is on Plaquenil methotrexate and Humira.  She was seen by Dr. Mike Gip back in September 2020 when her hemoglobin was 8 and she had conclusive evidence of iron deficiency with a ferritin level of 10 and iron saturation of 6%.  She received Venofer back then and her hemoglobin improved to 10 and has remained around that range since then.Colonoscopy on 08/15/2020 revealed one 5 mm polyp in the proximal ascending colon (tubular adenoma). There was diverticulosis in the sigmoid colon. EGD was normal.  B12 and folate have been normal in the past   She has stage III CKD and follows up with Dr. Holley Raring.   Interval history-since her last visit with me patient was admitted to the hospital for CHF exacerbation in October 2022.  She is doing well since then.  She has baseline fatigue and exertional shortness of breath.  Denies any new complaints at this time  ECOG PS- 1 Pain scale- 0   Review of systems- Review of Systems  Constitutional:  Positive for malaise/fatigue. Negative for chills, fever and weight loss.  HENT:  Negative for congestion, ear discharge  and nosebleeds.   Eyes:  Negative for blurred vision.  Respiratory:  Positive for shortness of breath. Negative for cough, hemoptysis, sputum production and wheezing.   Cardiovascular:  Negative for chest pain, palpitations, orthopnea and claudication.  Gastrointestinal:  Negative for abdominal pain, blood in stool, constipation, diarrhea, heartburn, melena, nausea and vomiting.  Genitourinary:  Negative for dysuria, flank pain, frequency, hematuria and urgency.  Musculoskeletal:  Negative for back pain, joint pain and myalgias.  Skin:  Negative for rash.  Neurological:  Negative for dizziness, tingling, focal weakness, seizures, weakness and headaches.  Endo/Heme/Allergies:  Does not bruise/bleed easily.  Psychiatric/Behavioral:  Negative for depression and suicidal ideas. The patient does not have insomnia.       Allergies  Allergen Reactions   Gabapentin Other (See Comments)    Weight gain   Ibuprofen Other (See Comments)    Headache     Past Medical History:  Diagnosis Date   Anxiety    CHF (congestive heart failure) (HCC)    Chronic kidney disease    Chronic pain    Chronic radicular pain of lower back    Costochondritis    Depression    Encounter for blood transfusion    GERD (gastroesophageal reflux disease)    Headache    migraines   Hip dysplasia    Hyperlipidemia    Hypertension    Iron deficiency anemia    Low back pain    Low vitamin B12 level    Obesity    Osteoporosis    Pelvic fracture (HCC)    Raynaud's disease without gangrene    Restless leg syndrome    Rheumatoid  aortitis    Rheumatoid arthritis ([place])    polyarthritis   Rheumatoid arthritis ([place])    Seasonal allergies    Sleep disorder    Thoracic compression fracture [facility])      Past Surgical History:  Procedure Laterality Date   ABDOMINAL SURGERY     pt denies   APPENDECTOMY     CARPAL TUNNEL RELEASE Bilateral    CHOLECYSTECTOMY     COLONOSCOPY WITH PROPOFOL N/A 08/15/2020    Procedure: COLONOSCOPY WITH PROPOFOL;  Surgeon: Robert Bellow, MD;  Location: [facility];  Service: Endoscopy;  Laterality: N/A;   DILATION AND CURETTAGE OF UTERUS     ESOPHAGOGASTRODUODENOSCOPY (EGD) WITH PROPOFOL N/A 08/15/2020   Procedure: ESOPHAGOGASTRODUODENOSCOPY (EGD) WITH PROPOFOL;  Surgeon: Robert Bellow, MD;  Location: [facility];  Service: Endoscopy;  Laterality: N/A;   FRACTURE SURGERY     hip fracture    FRACTURE SURGERY     pelvic fracture plate    HIP SURGERY Left    KNEE ARTHROPLASTY Right 10/13/2020   Procedure: COMPUTER ASSISTED TOTAL KNEE ARTHROPLASTY - RNFA;  Surgeon: Dereck Leep, MD;  Location: [facility];  Service: Orthopedics;  Laterality: Right;   TUBAL LIGATION      Social History   Socioeconomic History   Marital status: Married    Spouse name: Not on file   Number of children: Not on file   Years of education: Not on file   Highest education level: Not on file  Occupational History   Not on file  Tobacco Use   Smoking status: Never   Smokeless tobacco: Never  Vaping Use   Vaping Use: Never used  Substance and Sexual Activity   Alcohol use: No   Drug use: No   Sexual activity: Yes  Other Topics Concern   Not on file  Social History Narrative   Not on file   Social Determinants of Health   Financial Resource Strain: Not on file  Food Insecurity: Not on file  Transportation Needs: Not on file  Physical Activity: Not on file  Stress: Not on file  Social Connections: Not on file  Intimate Partner Violence: Not on file    Family History  Problem Relation Age of Onset   Diabetes Mother    Hypertension Mother    Aneurysm Father    Diabetes Son    Seizures Son    Osteosarcoma Brother    Cancer Brother    Diabetes Brother    Diabetes Maternal Grandfather    Heart disease Maternal Grandfather      Current Outpatient Medications:    Acetaminophen-Caffeine (EXCEDRIN TENSION HEADACHE) 500-65 MG TABS, Take 1 tablet by mouth  daily as needed (Headache)., Disp: , Rfl:    Adalimumab 40 MG/0.4ML PSKT, Inject 40 mg into the skin every 14 (fourteen) days. (Patient not taking: Reported on 10/23/2021), Disp: , Rfl:    albuterol (VENTOLIN HFA) 108 (90 Base) MCG/ACT inhaler, Inhale 2 puffs into the lungs every 4 (four) hours as needed., Disp: 18 g, Rfl: 0   calcitRIOL (ROCALTROL) 0.25 MCG capsule, Take by mouth., Disp: , Rfl:    Calcium Carb-Cholecalciferol 600-400 MG-UNIT CAPS, Take 1 capsule by mouth 2 (two) times daily., Disp: , Rfl:    Cholecalciferol 50 MCG (2000 UT) CAPS, Take 2,000 Units by mouth daily., Disp: , Rfl:    cyclobenzaprine (FLEXERIL) 10 MG tablet, Take 10 mg by mouth at bedtime., Disp: , Rfl:    enalapril-hydrochlorothiazide (VASERETIC) 10-25 MG tablet, Take 1  tablet by mouth daily., Disp: , Rfl:    ferrous sulfate 325 (65 FE) MG tablet, Take 325 mg by mouth daily with breakfast., Disp: , Rfl:    FLUoxetine (PROZAC) 40 MG capsule, Take 40 mg by mouth daily., Disp: , Rfl:    folic acid (FOLVITE) 1 MG tablet, Take 1 mg by mouth daily., Disp: , Rfl:    furosemide (LASIX) 20 MG tablet, Take 1 tablet (20 mg total) by mouth daily. (Patient taking differently: Take 60 mg by mouth daily.), Disp: 30 tablet, Rfl: 0   hydroxychloroquine (PLAQUENIL) 200 MG tablet, Take 1 tablet by mouth 2 (two) times daily., Disp: , Rfl:    methotrexate (50 MG/ML) 1 g injection, Inject 25 mg into the vein once a week. .6 ml, Disp: , Rfl:    mirtazapine (REMERON) 30 MG tablet, , Disp: , Rfl:    Multiple Vitamins-Minerals (MULTIVITAMIN ADULT PO), Take 1 tablet by mouth daily. , Disp: , Rfl:    omeprazole (PRILOSEC) 40 MG capsule, Take 40 mg by mouth daily., Disp: , Rfl:    pramipexole (MIRAPEX) 0.125 MG tablet, Take 0.125 mg by mouth daily. Take 0.125 mg in the morning and 2 at bedtime, Disp: , Rfl:    Propylene Glycol 0.6 % SOLN, Apply to eye., Disp: , Rfl:    rosuvastatin (CRESTOR) 5 MG tablet, Take 5 mg by mouth every other day. ,  Disp: , Rfl:    spironolactone (ALDACTONE) 25 MG tablet, , Disp: , Rfl:    vitamin B-12 (CYANOCOBALAMIN) 500 MCG tablet, Take 500 mcg by mouth daily., Disp: , Rfl:   Physical exam:  Vitals:   01/06/22 1131  BP: 126/65  Pulse: 89  Resp: 16  Temp: 98.8 F (37.1 C)  Weight: 209 lb 4.8 oz (94.9 kg)  Height: 5\' 3"  (1.6 m)   Physical Exam Constitutional:      General: She is not in acute distress. Cardiovascular:     Rate and Rhythm: Normal rate and regular rhythm.     Heart sounds: Normal heart sounds.  Pulmonary:     Effort: Pulmonary effort is normal.     Breath sounds: Normal breath sounds.  Abdominal:     General: Bowel sounds are normal.     Palpations: Abdomen is soft.  Skin:    General: Skin is warm and dry.  Neurological:     Mental Status: She is alert and oriented to person, place, and time.     CMP Latest Ref Rng & Units 10/02/2021  Glucose 70 - 99 mg/dL 123(H)  BUN 8 - 23 mg/dL 58(H)  Creatinine 0.44 - 1.00 mg/dL 2.40(H)  Sodium 135 - 145 mmol/L 137  Potassium 3.5 - 5.1 mmol/L 3.7  Chloride 98 - 111 mmol/L 94(L)  CO2 22 - 32 mmol/L 31  Calcium 8.9 - 10.3 mg/dL 8.7(L)  Total Protein 6.5 - 8.1 g/dL -  Total Bilirubin 0.3 - 1.2 mg/dL -  Alkaline Phos 38 - 126 U/L -  AST 15 - 41 U/L -  ALT 0 - 44 U/L -   CBC Latest Ref Rng & Units 10/02/2021  WBC 4.0 - 10.5 K/uL 13.8(H)  Hemoglobin 12.0 - 15.0 g/dL 10.1(L)  Hematocrit 36.0 - 46.0 % 30.3(L)  Platelets 150 - 400 K/uL 307    Assessment and plan- Patient is a 71 y.o. female with history of anemia that is secondary to kidney disease, chronic disease as well as iron deficiency here for a routine follow-up  Overall patient's  hemoglobin is remained stable around 10 for the last 2 to 3 years.  Her iron studies from November 2022 were within normal limits.  Iron studies from today are pending.  She does not require any IV iron at this time.She also has MGUS with an M protein of 1.1 IgG lambda noted back in July 2022  which we will also continue to monitor.  Myeloma labs from today are pending  I will see her back in 6 months with CBC with differential ferritin and iron studies myeloma panel and CMP   Visit Diagnosis 1. Anemia of chronic disease   2. Anemia of chronic kidney failure, stage 3 (moderate) (HCC)      Dr. Randa Evens, MD, MPH [facility] [phone] 01/06/2022 11:26 AM

## 2022-01-07 LAB — KAPPA/LAMBDA LIGHT CHAINS
Kappa free light chain: 81.1 mg/L — ABNORMAL HIGH (ref 3.3–19.4)
Kappa, lambda light chain ratio: 2.04 — ABNORMAL HIGH (ref 0.26–1.65)
Lambda free light chains: 39.8 mg/L — ABNORMAL HIGH (ref 5.7–26.3)

## 2022-01-11 LAB — MULTIPLE MYELOMA PANEL, SERUM
Albumin SerPl Elph-Mcnc: 3.4 g/dL (ref 2.9–4.4)
Albumin/Glob SerPl: 0.9 (ref 0.7–1.7)
Alpha 1: 0.3 g/dL (ref 0.0–0.4)
Alpha2 Glob SerPl Elph-Mcnc: 1 g/dL (ref 0.4–1.0)
B-Globulin SerPl Elph-Mcnc: 1.9 g/dL — ABNORMAL HIGH (ref 0.7–1.3)
Gamma Glob SerPl Elph-Mcnc: 1.1 g/dL (ref 0.4–1.8)
Globulin, Total: 4.2 g/dL — ABNORMAL HIGH (ref 2.2–3.9)
IgA: 229 mg/dL (ref 87–352)
IgG (Immunoglobin G), Serum: 1947 mg/dL — ABNORMAL HIGH (ref 586–1602)
IgM (Immunoglobulin M), Srm: 152 mg/dL (ref 26–217)
M Protein SerPl Elph-Mcnc: 1.1 g/dL — ABNORMAL HIGH
Total Protein ELP: 7.6 g/dL (ref 6.0–8.5)

## 2022-01-11 IMAGING — DX DG KNEE 1-2V PORT*R*
2 series · 2 of 2 positions shown · non-contrast
Comparison: 06/13/2020

CLINICAL DATA: Status post right knee replacement

EXAM:
PORTABLE RIGHT KNEE - 2 VIEW

[knee ap]
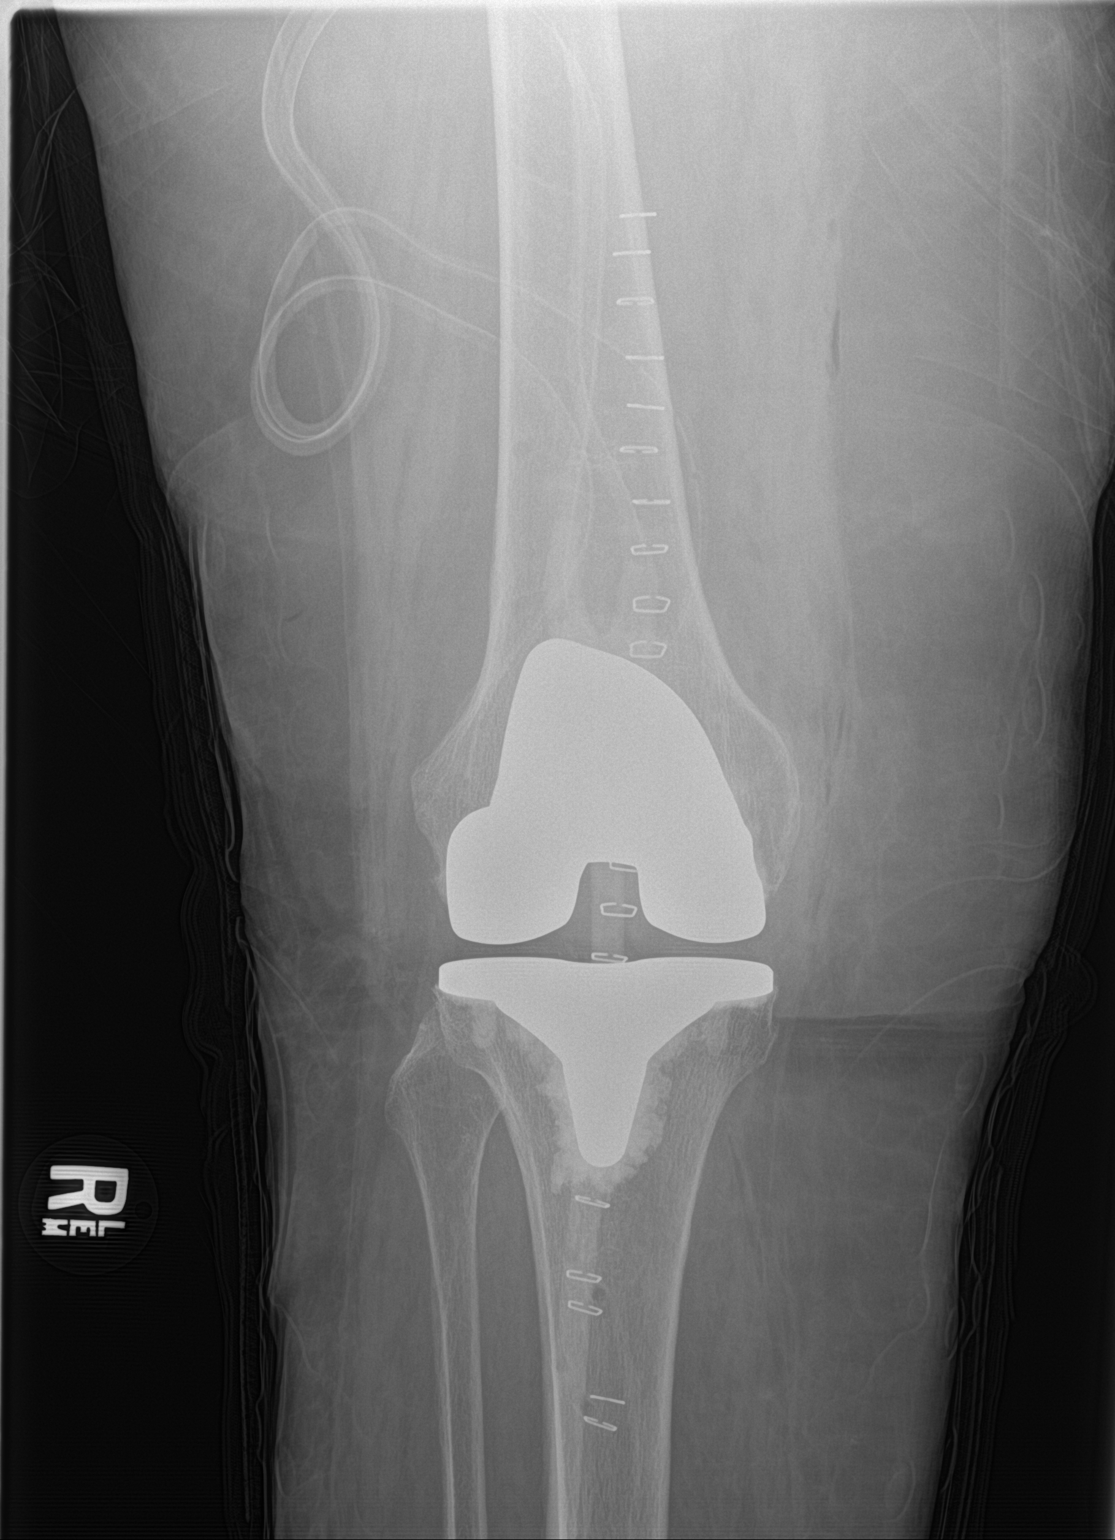

[knee lat]
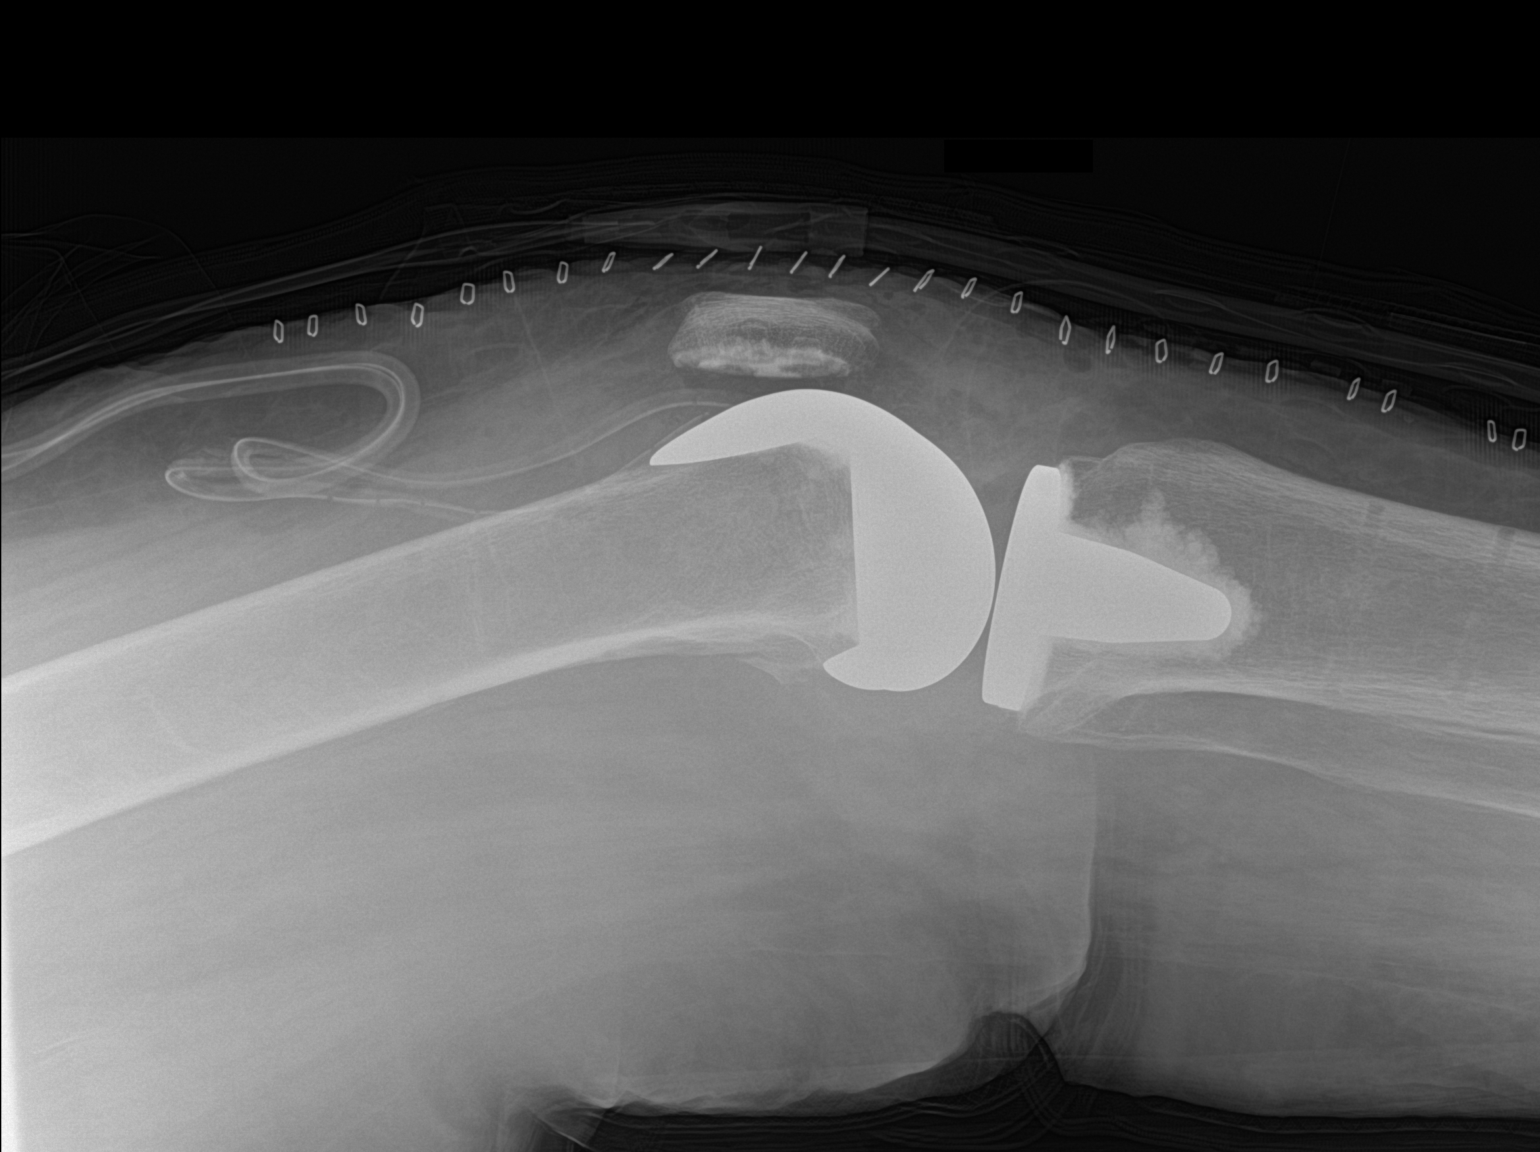

[2 of 2 positions shown; findings below may reference images not displayed]

FINDINGS: Right knee prosthesis is noted in satisfactory position. No acute
bony or soft tissue abnormality is seen. Surgical drains are noted
in place.
IMPRESSION: No acute abnormality noted following right knee replacement.

## 2022-02-13 DIAGNOSIS — K5900 Constipation, unspecified: Secondary | ICD-10-CM | POA: Diagnosis not present

## 2022-02-13 DIAGNOSIS — I130 Hypertensive heart and chronic kidney disease with heart failure and stage 1 through stage 4 chronic kidney disease, or unspecified chronic kidney disease: Secondary | ICD-10-CM | POA: Diagnosis not present

## 2022-02-13 DIAGNOSIS — N189 Chronic kidney disease, unspecified: Secondary | ICD-10-CM | POA: Diagnosis not present

## 2022-02-13 DIAGNOSIS — R32 Unspecified urinary incontinence: Secondary | ICD-10-CM | POA: Diagnosis not present

## 2022-02-13 DIAGNOSIS — Z8249 Family history of ischemic heart disease and other diseases of the circulatory system: Secondary | ICD-10-CM | POA: Diagnosis not present

## 2022-02-13 DIAGNOSIS — I509 Heart failure, unspecified: Secondary | ICD-10-CM | POA: Diagnosis not present

## 2022-02-13 DIAGNOSIS — E785 Hyperlipidemia, unspecified: Secondary | ICD-10-CM | POA: Diagnosis not present

## 2022-02-13 DIAGNOSIS — F325 Major depressive disorder, single episode, in full remission: Secondary | ICD-10-CM | POA: Diagnosis not present

## 2022-02-13 DIAGNOSIS — M810 Age-related osteoporosis without current pathological fracture: Secondary | ICD-10-CM | POA: Diagnosis not present

## 2022-02-13 DIAGNOSIS — Z833 Family history of diabetes mellitus: Secondary | ICD-10-CM | POA: Diagnosis not present

## 2022-02-13 DIAGNOSIS — Z79631 Long term (current) use of antimetabolite agent: Secondary | ICD-10-CM | POA: Diagnosis not present

## 2022-02-13 DIAGNOSIS — Z6836 Body mass index (BMI) 36.0-36.9, adult: Secondary | ICD-10-CM | POA: Diagnosis not present

## 2022-02-13 DIAGNOSIS — Z79899 Other long term (current) drug therapy: Secondary | ICD-10-CM | POA: Diagnosis not present

## 2022-02-13 DIAGNOSIS — M069 Rheumatoid arthritis, unspecified: Secondary | ICD-10-CM | POA: Diagnosis not present

## 2022-02-13 DIAGNOSIS — F419 Anxiety disorder, unspecified: Secondary | ICD-10-CM | POA: Diagnosis not present

## 2022-02-13 DIAGNOSIS — E261 Secondary hyperaldosteronism: Secondary | ICD-10-CM | POA: Diagnosis not present

## 2022-02-13 DIAGNOSIS — M1990 Unspecified osteoarthritis, unspecified site: Secondary | ICD-10-CM | POA: Diagnosis not present

## 2022-03-01 NOTE — Progress Notes (Signed)
Patient ID: Tonya Cummings, female    DOB: September 21, 1951, 71 y.o.   MRN: [id number]  HPI  Tonya Cummings is a 71 y/o female with a history of hyperlipidemia, HTN, CKD, anxiety, chronic pain, depression, GERD, anemia, restless leg syndrome, obstructive sleep apnea and chronic heart failure.   Echo report from 09/30/21 reviewed and showed an EF of 55-60% along with mild MR but without LVH/LAE.   Admitted 09/29/21 due to worsening shortness of breath. Initially placed on lasix gtt with transition to oral diuretics. Weaned off supplemental oxygen. V/Q scan negative for PE. Discharged after 3 days.  She presents today for a follow-up visit with a chief complaint of moderate fatigue with little exertion. She describes this as chronic in nature having been present for several years. She has associated shortness of breath, chest pain, pedal edema, anxiety and chronic difficulty sleeping along with this. She denies any abdominal distention, palpitations, dizziness, cough or weight gain.   She has been weighing daily, intentionally cutting back on her caloric intake and is down 11 lbs since her last visit.   Past Medical History:  Diagnosis Date   Anxiety    CHF (congestive heart failure) (HCC)    Chronic kidney disease    Chronic pain    Chronic radicular pain of lower back    Costochondritis    Depression    Encounter for blood transfusion    GERD (gastroesophageal reflux disease)    Headache    migraines   Hip dysplasia    Hyperlipidemia    Hypertension    Iron deficiency anemia    Low back pain    Low vitamin B12 level    Obesity    Osteoporosis    Pelvic fracture (HCC)    Raynaud's disease without gangrene    Restless leg syndrome    Rheumatoid aortitis    Rheumatoid arthritis (HCC)    polyarthritis   Rheumatoid arthritis (HCC)    Seasonal allergies    Sleep disorder    Thoracic compression fracture ([place])    Past Surgical History:  Procedure Laterality Date   ABDOMINAL  SURGERY     pt denies   APPENDECTOMY     CARPAL TUNNEL RELEASE Bilateral    CHOLECYSTECTOMY     COLONOSCOPY WITH PROPOFOL N/A 08/15/2020   Procedure: COLONOSCOPY WITH PROPOFOL;  Surgeon: Robert Bellow, MD;  Location: [place];  Service: Endoscopy;  Laterality: N/A;   DILATION AND CURETTAGE OF UTERUS     ESOPHAGOGASTRODUODENOSCOPY (EGD) WITH PROPOFOL N/A 08/15/2020   Procedure: ESOPHAGOGASTRODUODENOSCOPY (EGD) WITH PROPOFOL;  Surgeon: Robert Bellow, MD;  Location: [facility];  Service: Endoscopy;  Laterality: N/A;   FRACTURE SURGERY     hip fracture    FRACTURE SURGERY     pelvic fracture plate    HIP SURGERY Left    KNEE ARTHROPLASTY Right 10/13/2020   Procedure: COMPUTER ASSISTED TOTAL KNEE ARTHROPLASTY - RNFA;  Surgeon: Dereck Leep, MD;  Location: [facility];  Service: Orthopedics;  Laterality: Right;   TUBAL LIGATION     Family History  Problem Relation Age of Onset   Diabetes Mother    Hypertension Mother    Aneurysm Father    Diabetes Son    Seizures Son    Osteosarcoma Brother    Cancer Brother    Diabetes Brother    Diabetes Maternal Grandfather    Heart disease Maternal Grandfather    Social History   Tobacco Use   Smoking status:  Never   Smokeless tobacco: Never  Substance Use Topics   Alcohol use: No   Allergies  Allergen Reactions   Gabapentin Other (See Comments)    Weight gain   Ibuprofen Other (See Comments)    Headache   Prior to Admission medications   Medication Sig Start Date End Date Taking? Authorizing Provider  Acetaminophen-Caffeine (EXCEDRIN TENSION HEADACHE) 500-65 MG TABS Take 1 tablet by mouth daily as needed (Headache).   Yes [provider]  albuterol (VENTOLIN HFA) 108 (90 Base) MCG/ACT inhaler Inhale 2 puffs into the lungs every 4 (four) hours as needed. 09/23/21  Yes Margarette Canada, NP  calcitRIOL (ROCALTROL) 0.25 MCG capsule Take by mouth. 05/22/21  Yes [provider]  Calcium Carb-Cholecalciferol  600-400 MG-UNIT CAPS Take 1 capsule by mouth 2 (two) times daily. 01/13/10  Yes [provider]  Cholecalciferol 50 MCG (2000 UT) CAPS Take 2,000 Units by mouth daily. 09/23/09  Yes [provider]  cyclobenzaprine (FLEXERIL) 10 MG tablet Take 10 mg by mouth at bedtime.   Yes [provider]  enalapril-hydrochlorothiazide (VASERETIC) 10-25 MG tablet Take 1 tablet by mouth daily.   Yes [provider]  ferrous sulfate 325 (65 FE) MG tablet Take 325 mg by mouth daily with breakfast.   Yes [provider]  FLUoxetine (PROZAC) 40 MG capsule Take 40 mg by mouth daily.   Yes [provider]  folic acid (FOLVITE) 1 MG tablet Take 1 mg by mouth daily.   Yes [provider]  furosemide (LASIX) 20 MG tablet Take 1 tablet (20 mg total) by mouth daily. Patient taking differently: Take 60 mg by mouth daily. 10/02/21  Yes Wyvonnia Dusky, MD  hydroxychloroquine (PLAQUENIL) 200 MG tablet Take 1 tablet by mouth 2 (two) times daily. 06/22/21  Yes [provider]  methotrexate (50 MG/ML) 1 g injection Inject 25 mg into the vein once a week. .6 ml   Yes [provider]  mirtazapine (REMERON) 30 MG tablet  05/12/21  Yes [provider]  Multiple Vitamins-Minerals (MULTIVITAMIN ADULT PO) Take 1 tablet by mouth daily.  09/10/08  Yes [provider]  omeprazole (PRILOSEC) 40 MG capsule Take 40 mg by mouth daily.   Yes [provider]  pramipexole (MIRAPEX) 0.125 MG tablet Take 0.125 mg by mouth daily. Take 0.125 mg in the morning and 2 at bedtime 02/15/18  Yes [provider]  Propylene Glycol 0.6 % SOLN Apply to eye.   Yes [provider]  rosuvastatin (CRESTOR) 5 MG tablet Take 5 mg by mouth every other day.  07/18/19  Yes [provider]  spironolactone (ALDACTONE) 25 MG tablet  04/09/21  Yes [provider]  vitamin B-12 (CYANOCOBALAMIN) 500 MCG tablet Take 500 mcg by mouth daily.    Yes [provider]    Review of Systems  Constitutional:  Positive for fatigue (easily). Negative for appetite change.  HENT:  Positive for voice change. Negative for congestion, postnasal drip and sore throat.   Eyes: Negative.   Respiratory:  Positive for shortness of breath (easily). Negative for cough and chest tightness.   Cardiovascular:  Positive for chest pain and leg swelling. Negative for palpitations.  Gastrointestinal:  Negative for abdominal distention and abdominal pain.  Endocrine: Negative.   Genitourinary: Negative.   Musculoskeletal:  Positive for arthralgias (right shoulder/ feet). Negative for back pain.  Skin: Negative.   Allergic/Immunologic: Negative.   Neurological:  Negative for dizziness and light-headedness.  Hematological:  Negative for adenopathy. Does not bruise/bleed easily.  Psychiatric/Behavioral:  Positive for sleep disturbance (sleeping in recliner due to PND). Negative for dysphoric mood. The patient is nervous/anxious.    Vitals:   03/02/22 1002  BP: (!) 139/52  Pulse: (!) 54  Resp: 20  SpO2: 99%  Weight: 203 lb 6 oz (92.3 kg)  Height: '5\' 3"'$  (1.6 m)   Wt Readings from Last 3 Encounters:  03/02/22 203 lb 6 oz (92.3 kg)  01/06/22 209 lb 4.8 oz (94.9 kg)  12/01/21 214 lb 2 oz (97.1 kg)    Lab Results  Component Value Date   CREATININE 2.40 (H) 10/02/2021   CREATININE 2.42 (H) 10/01/2021   CREATININE 1.82 (H) 09/30/2021   Physical Exam Vitals and nursing note reviewed.  Constitutional:      General: She is not in acute distress.    Appearance: Normal appearance. She is not ill-appearing.  HENT:     Head: Normocephalic and atraumatic.  Cardiovascular:     Rate and Rhythm: Normal rate and regular rhythm.  Pulmonary:     Effort: Pulmonary effort is normal. Tachypnea (subsides with rest) present. No respiratory distress.     Breath sounds: No wheezing or rales.  Abdominal:     General: There is no distension.      Palpations: Abdomen is soft.     Tenderness: There is no abdominal tenderness.  Musculoskeletal:        General: No tenderness.     Cervical back: Normal range of motion and neck supple.     Right lower leg: Edema (1+ pitting) present.     Left lower leg: Edema (1+ pitting) present.  Skin:    General: Skin is warm and dry.  Neurological:     General: No focal deficit present.     Mental Status: She is alert and oriented to person, place, and time.  Psychiatric:        Mood and Affect: Mood is anxious.        Behavior: Behavior normal.        Thought Content: Thought content normal.    Assessment & Plan: 1: Chronic heart failure with preserved ejection fraction without structural changes- - NYHA class III - euvolemic today - weighing daily; reminded to call for an overnight weight gain of > 2 pounds or a weekly weight gain of >5 pounds; gained 3 pounds overnight on her scale - intentional weight loss of 11 lbs since last visit, 203 in office today  - trying to slowly increase being more active - lasix, spironolactone - will see Gollan on 03/05/22 - BNP 09/29/21 was 70.0  2: HTN with CKD- - BP 139/52 - saw nephrology Holley Raring) 10/08/21 - saw PCP (Gauger) 11/04/21; returns 04/21/22 - BMP 12/09/21 reviewed and showed sodium 140, potassium 5.1, creatinine 2.0 and GFR 25  3: Lymphedema- - stage 1b - limited in her ability to exercise due to her shortness of breath - encouraged to elevate them when sitting for long periods of time - wearing compression socks daily - wearing compression boots daily, edema is improved  4: Sleep apnea/SOB/PND- - saw pulmonology Lanney Gins) 12/24/21 for PFT, returns soon for a second one - sleeps in a recliner - said she does not have sleep apnea  5. Rheumatoid arthritis- - returns on 03/09/22 Posey Pronto)   Medication bottles reviewed.   Return in 6 months or sooner for any questions/problems before then.

## 2022-03-02 ENCOUNTER — Other Ambulatory Visit: Payer: Self-pay

## 2022-03-02 ENCOUNTER — Ambulatory Visit: Payer: Medicare HMO | Attending: Family | Admitting: Family

## 2022-03-02 ENCOUNTER — Encounter: Payer: Self-pay | Admitting: Family

## 2022-03-02 VITALS — BP 139/52 | HR 54 | Resp 20 | Ht 63.0 in | Wt 203.4 lb

## 2022-03-02 DIAGNOSIS — D649 Anemia, unspecified: Secondary | ICD-10-CM | POA: Diagnosis not present

## 2022-03-02 DIAGNOSIS — G4733 Obstructive sleep apnea (adult) (pediatric): Secondary | ICD-10-CM | POA: Insufficient documentation

## 2022-03-02 DIAGNOSIS — G8929 Other chronic pain: Secondary | ICD-10-CM | POA: Diagnosis not present

## 2022-03-02 DIAGNOSIS — Z79899 Other long term (current) drug therapy: Secondary | ICD-10-CM | POA: Insufficient documentation

## 2022-03-02 DIAGNOSIS — I5032 Chronic diastolic (congestive) heart failure: Secondary | ICD-10-CM | POA: Diagnosis not present

## 2022-03-02 DIAGNOSIS — Z9981 Dependence on supplemental oxygen: Secondary | ICD-10-CM | POA: Insufficient documentation

## 2022-03-02 DIAGNOSIS — G2581 Restless legs syndrome: Secondary | ICD-10-CM | POA: Insufficient documentation

## 2022-03-02 DIAGNOSIS — R609 Edema, unspecified: Secondary | ICD-10-CM | POA: Insufficient documentation

## 2022-03-02 DIAGNOSIS — F419 Anxiety disorder, unspecified: Secondary | ICD-10-CM | POA: Diagnosis not present

## 2022-03-02 DIAGNOSIS — I10 Essential (primary) hypertension: Secondary | ICD-10-CM | POA: Diagnosis not present

## 2022-03-02 DIAGNOSIS — M069 Rheumatoid arthritis, unspecified: Secondary | ICD-10-CM

## 2022-03-02 DIAGNOSIS — I130 Hypertensive heart and chronic kidney disease with heart failure and stage 1 through stage 4 chronic kidney disease, or unspecified chronic kidney disease: Secondary | ICD-10-CM | POA: Insufficient documentation

## 2022-03-02 DIAGNOSIS — I890 Lymphedema, not elsewhere classified: Secondary | ICD-10-CM | POA: Diagnosis not present

## 2022-03-02 DIAGNOSIS — G479 Sleep disorder, unspecified: Secondary | ICD-10-CM | POA: Diagnosis not present

## 2022-03-02 DIAGNOSIS — F32A Depression, unspecified: Secondary | ICD-10-CM | POA: Insufficient documentation

## 2022-03-02 DIAGNOSIS — N189 Chronic kidney disease, unspecified: Secondary | ICD-10-CM | POA: Insufficient documentation

## 2022-03-02 DIAGNOSIS — K219 Gastro-esophageal reflux disease without esophagitis: Secondary | ICD-10-CM | POA: Insufficient documentation

## 2022-03-02 NOTE — Patient Instructions (Signed)
Continue to weigh daily, restrict your fluid intake to no more than ~64 ounces/day. ? ?Continue to diet and exercise, good job losing weight!! ? ?Return in 6 months, sooner if needed.  ?

## 2022-03-05 ENCOUNTER — Other Ambulatory Visit: Payer: Self-pay

## 2022-03-05 ENCOUNTER — Encounter: Payer: Self-pay | Admitting: Cardiovascular Disease

## 2022-03-05 ENCOUNTER — Ambulatory Visit (INDEPENDENT_AMBULATORY_CARE_PROVIDER_SITE_OTHER): Payer: Medicare HMO | Admitting: Cardiovascular Disease

## 2022-03-05 VITALS — BP 120/62 | HR 89 | Ht 63.0 in | Wt 202.5 lb

## 2022-03-05 DIAGNOSIS — I10 Essential (primary) hypertension: Secondary | ICD-10-CM | POA: Diagnosis not present

## 2022-03-05 DIAGNOSIS — I5032 Chronic diastolic (congestive) heart failure: Secondary | ICD-10-CM | POA: Diagnosis not present

## 2022-03-05 DIAGNOSIS — G479 Sleep disorder, unspecified: Secondary | ICD-10-CM | POA: Diagnosis not present

## 2022-03-05 DIAGNOSIS — J9601 Acute respiratory failure with hypoxia: Secondary | ICD-10-CM

## 2022-03-05 DIAGNOSIS — R0609 Other forms of dyspnea: Secondary | ICD-10-CM | POA: Diagnosis not present

## 2022-03-05 NOTE — Patient Instructions (Addendum)
Lung works program for shortness of breath (referral has been placed, they should reach out to you for first consult) ? ?Podiatry  [facility] [place]) ? ?Medication Instructions:  ?No changes ? ?? See if Dr. Holley Raring will let you decrease lasix down to 60 alternating with 40 mg daily) ? ?If you need a refill on your cardiac medications before your next appointment, please call your pharmacy.  ? ?Lab work: ?No new labs needed ? ?Testing/Procedures: ?No new testing needed ? ?Follow-Up: ?At [facility], you and your health needs are our priority.  As part of our continuing mission to provide you with exceptional heart care, we have created designated Provider Care Teams.  These Care Teams include your primary Cardiologist (physician) and Advanced Practice Providers (APPs -  Physician Assistants and Nurse Practitioners) who all work together to provide you with the care you need, when you need it. ? ?You will need a follow up appointment in 12 months ? ?Providers on your designated Care Team:   ?Murray Hodgkins, NP ?Christell Faith, PA-C ?Cadence Kathlen Mody, PA-C ? ?COVID-19 Vaccine Information can be found at: [email or web address] For questions related to vaccine distribution or appointments, please email [email or web address] or call [phone].  ? ?

## 2022-03-05 NOTE — Progress Notes (Signed)
Cardiology Office Note ? ?Date:  03/05/2022  ? ?ID:  Tonya Cummings, DOB [birth year], MRN [id number] ? ?PCP:  [name], NP  ? ?Chief Complaint  ?Patient presents with  ? New Patient (Initial Visit)  ?  Ref by [facility] for shortness of breath and CHF. Patient c/o shortness of breath with any amount of exertion, LE edema and chest pain at times. Medications reviewed by the patient verbally.   ? ? ?HPI:  ?Tonya Cummings is a 71 year old woman with past medical history of ?Chronic diastolic CHF ?Anemia ?Obesity ?Chronic kidney disease, CR 2.0 ?Nonsmoker, no diabetes ?Chronic back pain ?Who presents to establish care in the [facility] office for her diastolic CHF ? ?In the hospital October 2022 for shortness of breath, diastolic CHF, leg swelling ?Hypertensive on arrival ?Treated with IV Lasix ?Negative VQ scan ? Echo shows EF 37-10%, grade I diastolic dysfunction & no regional wall abnormalities ? ?Followed by the congestive heart failure clinic, pulmonary ? ?On discussion of her Chronic SOB, since sept 2022 ?"Think it is from rheumatoid medication" ? ?Sedentary, no regular exercise program ?Somewhat limited by her shortness of breath ?Also reports having a foot problem, back problem ?Foot problem 6 months ? ?Lab work reviewed ?Total chol 161, LDL 82 ? ?Followed by pulmonary, notes requested and reviewed ?Was seen for chronic SOB from: "chronic anemia, physical deconditioning, obstructive sleep apnea, heart and kidney failure, morbid obesity, inflammatory arthropathy with rheumatoid and Raynaud's phenomenon, metabolic syndrome, and likely GERD with hiatal hernia which may be contributing with laryngopharyngeal reflux." ? ?EKG personally reviewed by myself on todays visit ?Normal sinus rhythm rate 88 bpm no significant ST-T wave changes ? ? ?PMH:   has a past medical history of Anxiety, CHF (congestive heart failure) ([place]), Chronic kidney disease, Chronic pain, Chronic radicular pain of lower  back, Costochondritis, Depression, Encounter for blood transfusion, GERD (gastroesophageal reflux disease), Headache, Hip dysplasia, Hyperlipidemia, Hypertension, Iron deficiency anemia, Low back pain, Low vitamin B12 level, Obesity, Osteoporosis, Pelvic fracture ([place]), Raynaud's disease without gangrene, Restless leg syndrome, Rheumatoid aortitis, Rheumatoid arthritis ([place]), Rheumatoid arthritis ([place]), Seasonal allergies, Sleep disorder, and Thoracic compression fracture ([place]). ? ?PSH:    ?Past Surgical History:  ?Procedure Laterality Date  ? ABDOMINAL SURGERY    ? pt denies  ? APPENDECTOMY    ? CARPAL TUNNEL RELEASE Bilateral   ? CHOLECYSTECTOMY    ? COLONOSCOPY WITH PROPOFOL N/A 08/15/2020  ? Procedure: COLONOSCOPY WITH PROPOFOL;  Surgeon: Robert Bellow, MD;  Location: [facility] END[facility]COPY;  Service: Endoscopy;  Laterality: N/A;  ? DILATION AND CURETTAGE OF UTERUS    ? ESOPHAGOGASTRODUODENOSCOPY (EGD) WITH PROPOFOL N/A 08/15/2020  ? Procedure: ESOPHAGOGASTRODUODENOSCOPY (EGD) WITH PROPOFOL;  Surgeon: Robert Bellow, MD;  Location: [facility] ENDOSCOPY;  Service: Endoscopy;  Laterality: N/A;  ? FRACTURE SURGERY    ? hip fracture   ? FRACTURE SURGERY    ? pelvic fracture plate   ? HIP SURGERY Left   ? KNEE ARTHROPLASTY Right 10/13/2020  ? Procedure: COMPUTER ASSISTED TOTAL KNEE ARTHROPLASTY - RNFA;  Surgeon: Dereck Leep, MD;  Location: [facility];  Service: Orthopedics;  Laterality: Right;  ? TUBAL LIGATION    ? ? ?Current Outpatient Medications  ?Medication Sig Dispense Refill  ? Acetaminophen-Caffeine (EXCEDRIN TENSION HEADACHE) 500-65 MG TABS Take 1 tablet by mouth daily as needed (Headache).    ? albuterol (VENTOLIN HFA) 108 (90 Base) MCG/ACT inhaler Inhale 2 puffs into the lungs every 4 (four) hours as needed. [place]  g 0  ? calcitRIOL (ROCALTROL) 0.25 MCG capsule Take by mouth.    ? Calcium Carb-Cholecalciferol 600-400 MG-UNIT CAPS Take 1 capsule by mouth 2 (two) times daily.    ? Cholecalciferol 50 MCG (2000 UT) CAPS  Take 2,000 Units by mouth daily.    ? cyclobenzaprine (FLEXERIL) 10 MG tablet Take 10 mg by mouth at bedtime.    ? enalapril-hydrochlorothiazide (VASERETIC) 10-25 MG tablet Take 1 tablet by mouth daily.    ? ferrous sulfate 325 (65 FE) MG tablet Take 325 mg by mouth daily with breakfast.    ? FLUoxetine (PROZAC) 40 MG capsule Take 40 mg by mouth daily.    ? folic acid (FOLVITE) 1 MG tablet Take 1 mg by mouth daily.    ? furosemide (LASIX) 40 MG tablet Take 60 mg by mouth daily.    ? hydroxychloroquine (PLAQUENIL) 200 MG tablet Take 1 tablet by mouth 2 (two) times daily.    ? methotrexate (50 MG/ML) 1 g injection Inject 25 mg into the vein once a week. .6 ml    ? mirtazapine (REMERON) 30 MG tablet Take 30 mg by mouth at bedtime.    ? Multiple Vitamins-Minerals (MULTIVITAMIN ADULT PO) Take 1 tablet by mouth daily.     ? omeprazole (PRILOSEC) 40 MG capsule Take 40 mg by mouth daily.    ? pramipexole (MIRAPEX) 0.125 MG tablet Take 0.125 mg by mouth daily. Take 0.125 mg in the morning and 2 at bedtime    ? rosuvastatin (CRESTOR) 5 MG tablet Take 5 mg by mouth every other day.     ? spironolactone (ALDACTONE) 25 MG tablet     ? vitamin B-12 (CYANOCOBALAMIN) 500 MCG tablet Take 500 mcg by mouth daily.    ? ?No current facility-administered medications for this visit.  ? ? ? ?Allergies:   Gabapentin and Ibuprofen  ? ?Social History:  The patient  reports that she has never smoked. She has never used smokeless tobacco. She reports that she does not drink alcohol and does not use drugs.  ? ?Family History:   family history includes Aneurysm in her father; Cancer in her brother; Diabetes in her brother, maternal grandfather, mother, and son; Heart disease in her maternal grandfather; Hypertension in her mother; Osteosarcoma in her brother; Seizures in her son.  ? ? ?Review of Systems: ?Review of Systems  ?Constitutional: Negative.   ?HENT: Negative.    ?Respiratory:  Positive for shortness of breath.   ?Cardiovascular:  Negative.   ?Gastrointestinal: Negative.   ?Musculoskeletal:  Positive for back pain.  ?Neurological: Negative.   ?Psychiatric/Behavioral: Negative.    ?All other systems reviewed and are negative. ? ? ?PHYSICAL EXAM: ?VS:  BP 120/62 (BP Location: Right Arm, Patient Position: Sitting, Cuff Size: Normal)   Pulse 89   Ht '5\' 3"'$  (1.6 m)   Wt 202 lb 8 oz (91.9 kg)   SpO2 94%   BMI 35.87 kg/m?  , BMI Body mass index is 35.87 kg/m?. ?GEN: Well nourished, well developed, in no acute distress ?HEENT: normal ?Neck: no JVD, carotid bruits, or masses ?Cardiac: RRR; no murmurs, rubs, or gallops,no edema  ?Respiratory:  clear to auscultation bilaterally, normal work of breathing ?GI: soft, nontender, nondistended, + BS ?MS: no deformity or atrophy ?Skin: warm and dry, no rash ?Neuro:  Strength and sensation are intact ?Psych: euthymic mood, full affect ? ?Recent Labs: ?09/29/2021: B Natriuretic Peptide 70.0 ?10/02/2021: BUN 58; Creatinine, Ser 2.40; Potassium 3.7; Sodium 137 ?01/06/2022: Hemoglobin 10.6; Platelets  311  ? ? ?Lipid Panel ?No results found for: CHOL, HDL, LDLCALC, TRIG ?  ? ?Wt Readings from Last 3 Encounters:  ?03/05/22 202 lb 8 oz (91.9 kg)  ?03/02/22 203 lb 6 oz (92.3 kg)  ?01/06/22 209 lb 4.8 oz (94.9 kg)  ?  ? ? ?ASSESSMENT AND PLAN: ? ?Problem List Items Addressed This Visit   ? ?  ? Cardiology Problems  ? Hypertension  ? Relevant Medications  ? furosemide (LASIX) 40 MG tablet  ?  ? Other  ? Dyspnea  ? Acute hypoxemic respiratory failure (HCC) - Primary  ? Severe obesity (BMI 35.0-39.9) with comorbidity ([place])  ? Sleep disorder  ? ?Other Visit Diagnoses   ? ? Chronic diastolic CHF (congestive heart failure) ([place])      ? Relevant Medications  ? furosemide (LASIX) 40 MG tablet  ? ?  ? ? ?Chronic respiratory distress ?Likely multifactorial as detailed above by pulmonary ?Normal ejection fraction, appears euvolemic if not hypovolemic ?No medication changes made ?Few risk factors for cardiac disease, non-smoker,  very good cholesterol, no diabetes ?She has follow-up with pulmonary, has had PFTs and sleep study results are pending ?Recommend she consider participating in the lung Works program ?Very deconditioned, sedentary

## 2022-03-09 DIAGNOSIS — M0609 Rheumatoid arthritis without rheumatoid factor, multiple sites: Secondary | ICD-10-CM | POA: Diagnosis not present

## 2022-03-09 DIAGNOSIS — Z7960 Long term (current) use of unspecified immunomodulators and immunosuppressants: Secondary | ICD-10-CM | POA: Insufficient documentation

## 2022-03-09 DIAGNOSIS — N1832 Chronic kidney disease, stage 3b: Secondary | ICD-10-CM | POA: Diagnosis not present

## 2022-03-09 DIAGNOSIS — M79672 Pain in left foot: Secondary | ICD-10-CM | POA: Diagnosis not present

## 2022-03-11 DIAGNOSIS — R809 Proteinuria, unspecified: Secondary | ICD-10-CM | POA: Diagnosis not present

## 2022-03-11 DIAGNOSIS — I10 Essential (primary) hypertension: Secondary | ICD-10-CM | POA: Diagnosis not present

## 2022-03-11 DIAGNOSIS — N2581 Secondary hyperparathyroidism of renal origin: Secondary | ICD-10-CM | POA: Diagnosis not present

## 2022-03-11 DIAGNOSIS — N184 Chronic kidney disease, stage 4 (severe): Secondary | ICD-10-CM | POA: Diagnosis not present

## 2022-03-11 DIAGNOSIS — D631 Anemia in chronic kidney disease: Secondary | ICD-10-CM | POA: Diagnosis not present

## 2022-03-17 DIAGNOSIS — Z8739 Personal history of other diseases of the musculoskeletal system and connective tissue: Secondary | ICD-10-CM | POA: Diagnosis not present

## 2022-03-17 DIAGNOSIS — I7300 Raynaud's syndrome without gangrene: Secondary | ICD-10-CM | POA: Diagnosis not present

## 2022-03-17 DIAGNOSIS — G894 Chronic pain syndrome: Secondary | ICD-10-CM | POA: Diagnosis not present

## 2022-03-17 DIAGNOSIS — M79672 Pain in left foot: Secondary | ICD-10-CM | POA: Diagnosis not present

## 2022-03-17 DIAGNOSIS — M19072 Primary osteoarthritis, left ankle and foot: Secondary | ICD-10-CM | POA: Diagnosis not present

## 2022-03-17 DIAGNOSIS — M216X1 Other acquired deformities of right foot: Secondary | ICD-10-CM | POA: Diagnosis not present

## 2022-03-17 DIAGNOSIS — M2142 Flat foot [pes planus] (acquired), left foot: Secondary | ICD-10-CM | POA: Diagnosis not present

## 2022-03-17 DIAGNOSIS — G629 Polyneuropathy, unspecified: Secondary | ICD-10-CM | POA: Diagnosis not present

## 2022-03-17 DIAGNOSIS — M545 Low back pain, unspecified: Secondary | ICD-10-CM | POA: Diagnosis not present

## 2022-03-18 DIAGNOSIS — J849 Interstitial pulmonary disease, unspecified: Secondary | ICD-10-CM | POA: Diagnosis not present

## 2022-03-18 DIAGNOSIS — R0600 Dyspnea, unspecified: Secondary | ICD-10-CM | POA: Diagnosis not present

## 2022-03-22 ENCOUNTER — Other Ambulatory Visit: Payer: Self-pay | Admitting: Pulmonary Disease

## 2022-03-22 DIAGNOSIS — J849 Interstitial pulmonary disease, unspecified: Secondary | ICD-10-CM

## 2022-03-22 DIAGNOSIS — R0600 Dyspnea, unspecified: Secondary | ICD-10-CM

## 2022-03-31 DIAGNOSIS — N184 Chronic kidney disease, stage 4 (severe): Secondary | ICD-10-CM | POA: Diagnosis not present

## 2022-04-02 ENCOUNTER — Ambulatory Visit
Admission: RE | Admit: 2022-04-02 | Discharge: 2022-04-02 | Disposition: A | Discharge status: Home / Self Care | Payer: Medicare HMO | Source: Ambulatory Visit | Attending: Pulmonary Disease | Admitting: Pulmonary Disease

## 2022-04-02 ENCOUNTER — Other Ambulatory Visit: Payer: Self-pay | Admitting: Pulmonary Disease

## 2022-04-02 DIAGNOSIS — I2510 Atherosclerotic heart disease of native coronary artery without angina pectoris: Secondary | ICD-10-CM | POA: Diagnosis not present

## 2022-04-02 DIAGNOSIS — R0600 Dyspnea, unspecified: Secondary | ICD-10-CM

## 2022-04-02 DIAGNOSIS — J849 Interstitial pulmonary disease, unspecified: Secondary | ICD-10-CM

## 2022-04-02 DIAGNOSIS — I700 Atherosclerosis of aorta: Secondary | ICD-10-CM | POA: Diagnosis not present

## 2022-04-02 DIAGNOSIS — J84112 Idiopathic pulmonary fibrosis: Secondary | ICD-10-CM | POA: Diagnosis not present

## 2022-04-02 DIAGNOSIS — K449 Diaphragmatic hernia without obstruction or gangrene: Secondary | ICD-10-CM | POA: Diagnosis not present

## 2022-04-15 DIAGNOSIS — N184 Chronic kidney disease, stage 4 (severe): Secondary | ICD-10-CM | POA: Diagnosis not present

## 2022-04-21 DIAGNOSIS — G2581 Restless legs syndrome: Secondary | ICD-10-CM | POA: Diagnosis not present

## 2022-04-21 DIAGNOSIS — I130 Hypertensive heart and chronic kidney disease with heart failure and stage 1 through stage 4 chronic kidney disease, or unspecified chronic kidney disease: Secondary | ICD-10-CM | POA: Diagnosis not present

## 2022-04-21 DIAGNOSIS — E782 Mixed hyperlipidemia: Secondary | ICD-10-CM | POA: Diagnosis not present

## 2022-04-21 DIAGNOSIS — M059 Rheumatoid arthritis with rheumatoid factor, unspecified: Secondary | ICD-10-CM | POA: Diagnosis not present

## 2022-04-21 DIAGNOSIS — F418 Other specified anxiety disorders: Secondary | ICD-10-CM | POA: Diagnosis not present

## 2022-04-21 DIAGNOSIS — Z79899 Other long term (current) drug therapy: Secondary | ICD-10-CM | POA: Diagnosis not present

## 2022-04-21 DIAGNOSIS — N184 Chronic kidney disease, stage 4 (severe): Secondary | ICD-10-CM | POA: Diagnosis not present

## 2022-04-21 DIAGNOSIS — G479 Sleep disorder, unspecified: Secondary | ICD-10-CM | POA: Diagnosis not present

## 2022-07-06 ENCOUNTER — Other Ambulatory Visit: Payer: Self-pay

## 2022-07-06 DIAGNOSIS — D631 Anemia in chronic kidney disease: Secondary | ICD-10-CM

## 2022-07-07 ENCOUNTER — Encounter: Payer: Self-pay | Admitting: Oncology

## 2022-07-07 ENCOUNTER — Inpatient Hospital Stay (HOSPITAL_BASED_OUTPATIENT_CLINIC_OR_DEPARTMENT_OTHER): Payer: Medicare HMO | Admitting: Oncology

## 2022-07-07 ENCOUNTER — Inpatient Hospital Stay: Payer: Medicare HMO | Attending: Oncology

## 2022-07-07 VITALS — BP 113/61 | HR 81 | Temp 98.0°F | Resp 18 | Wt 192.0 lb

## 2022-07-07 DIAGNOSIS — Z9049 Acquired absence of other specified parts of digestive tract: Secondary | ICD-10-CM | POA: Insufficient documentation

## 2022-07-07 DIAGNOSIS — D122 Benign neoplasm of ascending colon: Secondary | ICD-10-CM | POA: Diagnosis not present

## 2022-07-07 DIAGNOSIS — E611 Iron deficiency: Secondary | ICD-10-CM | POA: Diagnosis not present

## 2022-07-07 DIAGNOSIS — G8929 Other chronic pain: Secondary | ICD-10-CM | POA: Insufficient documentation

## 2022-07-07 DIAGNOSIS — I129 Hypertensive chronic kidney disease with stage 1 through stage 4 chronic kidney disease, or unspecified chronic kidney disease: Secondary | ICD-10-CM | POA: Diagnosis not present

## 2022-07-07 DIAGNOSIS — D631 Anemia in chronic kidney disease: Secondary | ICD-10-CM | POA: Diagnosis not present

## 2022-07-07 DIAGNOSIS — Z8269 Family history of other diseases of the musculoskeletal system and connective tissue: Secondary | ICD-10-CM | POA: Diagnosis not present

## 2022-07-07 DIAGNOSIS — Z888 Allergy status to other drugs, medicaments and biological substances status: Secondary | ICD-10-CM | POA: Diagnosis not present

## 2022-07-07 DIAGNOSIS — Z820 Family history of epilepsy and other diseases of the nervous system: Secondary | ICD-10-CM | POA: Diagnosis not present

## 2022-07-07 DIAGNOSIS — Z8249 Family history of ischemic heart disease and other diseases of the circulatory system: Secondary | ICD-10-CM | POA: Insufficient documentation

## 2022-07-07 DIAGNOSIS — N183 Chronic kidney disease, stage 3 unspecified: Secondary | ICD-10-CM | POA: Insufficient documentation

## 2022-07-07 DIAGNOSIS — Z886 Allergy status to analgesic agent status: Secondary | ICD-10-CM | POA: Diagnosis not present

## 2022-07-07 DIAGNOSIS — Z809 Family history of malignant neoplasm, unspecified: Secondary | ICD-10-CM | POA: Insufficient documentation

## 2022-07-07 DIAGNOSIS — Z833 Family history of diabetes mellitus: Secondary | ICD-10-CM | POA: Insufficient documentation

## 2022-07-07 DIAGNOSIS — Z79899 Other long term (current) drug therapy: Secondary | ICD-10-CM | POA: Diagnosis not present

## 2022-07-07 DIAGNOSIS — D509 Iron deficiency anemia, unspecified: Secondary | ICD-10-CM | POA: Diagnosis not present

## 2022-07-07 DIAGNOSIS — M069 Rheumatoid arthritis, unspecified: Secondary | ICD-10-CM | POA: Insufficient documentation

## 2022-07-07 LAB — IRON AND TIBC
Iron: 25 ug/dL — ABNORMAL LOW (ref 28–170)
Saturation Ratios: 8 % — ABNORMAL LOW (ref 10.4–31.8)
TIBC: 333 ug/dL (ref 250–450)
UIBC: 308 ug/dL

## 2022-07-07 LAB — CBC WITH DIFFERENTIAL/PLATELET
Abs Immature Granulocytes: 0.03 10*3/uL (ref 0.00–0.07)
Basophils Absolute: 0 10*3/uL (ref 0.0–0.1)
Basophils Relative: 0 %
Eosinophils Absolute: 0.1 10*3/uL (ref 0.0–0.5)
Eosinophils Relative: 1 %
HCT: 27 % — ABNORMAL LOW (ref 36.0–46.0)
Hemoglobin: 8.4 g/dL — ABNORMAL LOW (ref 12.0–15.0)
Immature Granulocytes: 0 %
Lymphocytes Relative: 18 %
Lymphs Abs: 1.7 10*3/uL (ref 0.7–4.0)
MCH: 29.1 pg (ref 26.0–34.0)
MCHC: 31.1 g/dL (ref 30.0–36.0)
MCV: 93.4 fL (ref 80.0–100.0)
Monocytes Absolute: 0.5 10*3/uL (ref 0.1–1.0)
Monocytes Relative: 5 %
Neutro Abs: 7.2 10*3/uL (ref 1.7–7.7)
Neutrophils Relative %: 76 %
Platelets: 301 10*3/uL (ref 150–400)
RBC: 2.89 MIL/uL — ABNORMAL LOW (ref 3.87–5.11)
RDW: 14.9 % (ref 11.5–15.5)
WBC: 9.5 10*3/uL (ref 4.0–10.5)
nRBC: 0 % (ref 0.0–0.2)

## 2022-07-07 LAB — COMPREHENSIVE METABOLIC PANEL
ALT: 19 U/L (ref 0–44)
AST: 25 U/L (ref 15–41)
Albumin: 3.6 g/dL (ref 3.5–5.0)
Alkaline Phosphatase: 83 U/L (ref 38–126)
Anion gap: 6 (ref 5–15)
BUN: 73 mg/dL — ABNORMAL HIGH (ref 8–23)
CO2: 24 mmol/L (ref 22–32)
Calcium: 9.1 mg/dL (ref 8.9–10.3)
Chloride: 106 mmol/L (ref 98–111)
Creatinine, Ser: 2.92 mg/dL — ABNORMAL HIGH (ref 0.44–1.00)
GFR, Estimated: 17 mL/min — ABNORMAL LOW (ref >60)
Glucose, Bld: 113 mg/dL — ABNORMAL HIGH (ref 70–99)
Potassium: 5.1 mmol/L (ref 3.5–5.1)
Sodium: 136 mmol/L (ref 135–145)
Total Bilirubin: 0.5 mg/dL (ref 0.3–1.2)
Total Protein: 8 g/dL (ref 6.5–8.1)

## 2022-07-07 LAB — FERRITIN: Ferritin: 63 ng/mL (ref 11–307)

## 2022-07-08 ENCOUNTER — Encounter: Payer: Self-pay | Admitting: Hematology and Oncology

## 2022-07-08 LAB — KAPPA/LAMBDA LIGHT CHAINS
Kappa free light chain: 87.1 mg/L — ABNORMAL HIGH (ref 3.3–19.4)
Kappa, lambda light chain ratio: 2.13 — ABNORMAL HIGH (ref 0.26–1.65)
Lambda free light chains: 40.9 mg/L — ABNORMAL HIGH (ref 5.7–26.3)

## 2022-07-08 NOTE — Progress Notes (Signed)
Hematology/Oncology Consult note [facility]  [phone] [phone]  Patient Care Team: [name], NP as PCP - General (Internal Medicine) Candis Shine, MD as Referring Physician (Gastroenterology) Marlowe Sax, MD as Referring Physician (Internal Medicine) Sindy Guadeloupe, MD as Consulting Physician (Hematology and Oncology)   Name of the patient: Tonya Cummings  [id number]  January 31, 1951   Date of visit: 07/08/22  Diagnosis- anemia likely multifactorial secondary to iron deficiency as well as component of anemia of chronic disease from rheumatoid arthritis and anemia of chronic kidney disease  Chief complaint/ Reason for visit-routine follow-up of anemia  Heme/Onc history: Patient is a 71 year old female with a history of seropositive rheumatoid arthritis for which she is on Plaquenil methotrexate and Humira.  She was seen by Dr. Mike Gip back in September 2020 when her hemoglobin was 8 and she had conclusive evidence of iron deficiency with a ferritin level of 10 and iron saturation of 6%.  She received Venofer back then and her hemoglobin improved to 10 and has remained around that range since then.Colonoscopy on 08/15/2020 revealed one 5 mm polyp in the proximal ascending colon (tubular adenoma). There was diverticulosis in the sigmoid colon. EGD was normal.  B12 and folate have been normal in the past   She has stage III CKD and follows up with Dr. Holley Raring.   Interval history-she continues to have significant joint pain from her rheumatoid arthritis.  States that the joints of her foot are deformed to the point that she is not able to walk without orthotic shoes.  She has not had any recent falls  ECOG PS- 1 Pain scale- 4   Review of systems- Review of Systems  Constitutional:  Negative for chills, fever, malaise/fatigue and weight loss.  HENT:  Negative for congestion, ear discharge and nosebleeds.   Eyes:   Negative for blurred vision.  Respiratory:  Negative for cough, hemoptysis, sputum production, shortness of breath and wheezing.   Cardiovascular:  Negative for chest pain, palpitations, orthopnea and claudication.  Gastrointestinal:  Negative for abdominal pain, blood in stool, constipation, diarrhea, heartburn, melena, nausea and vomiting.  Genitourinary:  Negative for dysuria, flank pain, frequency, hematuria and urgency.  Musculoskeletal:  Negative for back pain, joint pain and myalgias.  Skin:  Negative for rash.  Neurological:  Negative for dizziness, tingling, focal weakness, seizures, weakness and headaches.  Endo/Heme/Allergies:  Does not bruise/bleed easily.  Psychiatric/Behavioral:  Negative for depression and suicidal ideas. The patient does not have insomnia.       Allergies  Allergen Reactions   Gabapentin Other (See Comments)    Weight gain   Ibuprofen Other (See Comments)    Headache     Past Medical History:  Diagnosis Date   Anxiety    CHF (congestive heart failure) (HCC)    Chronic kidney disease    Chronic pain    Chronic radicular pain of lower back    Costochondritis    Depression    Encounter for blood transfusion    GERD (gastroesophageal reflux disease)    Headache    migraines   Hip dysplasia    Hyperlipidemia    Hypertension    Iron deficiency anemia    Low back pain    Low vitamin B12 level    Obesity    Osteoporosis    Pelvic fracture (HCC)    Raynaud's disease without gangrene    Restless leg syndrome    Rheumatoid aortitis    Rheumatoid  arthritis ([place])    polyarthritis   Rheumatoid arthritis ([place])    Seasonal allergies    Sleep disorder    Thoracic compression fracture [facility])      Past Surgical History:  Procedure Laterality Date   ABDOMINAL SURGERY     pt denies   APPENDECTOMY     CARPAL TUNNEL RELEASE Bilateral    CHOLECYSTECTOMY     COLONOSCOPY WITH PROPOFOL N/A 08/15/2020   Procedure: COLONOSCOPY WITH PROPOFOL;  Surgeon:  Robert Bellow, MD;  Location: [facility] ENDOSCOPY;  Service: Endoscopy;  Laterality: N/A;   DILATION AND CURETTAGE OF UTERUS     ESOPHAGOGASTRODUODENOSCOPY (EGD) WITH PROPOFOL N/A 08/15/2020   Procedure: ESOPHAGOGASTRODUODENOSCOPY (EGD) WITH PROPOFOL;  Surgeon: Robert Bellow, MD;  Location: [facility] ENDOSCOPY;  Service: Endoscopy;  Laterality: N/A;   FRACTURE SURGERY     hip fracture    FRACTURE SURGERY     pelvic fracture plate    HIP SURGERY Left    KNEE ARTHROPLASTY Right 10/13/2020   Procedure: COMPUTER ASSISTED TOTAL KNEE ARTHROPLASTY - RNFA;  Surgeon: Dereck Leep, MD;  Location: [facility] ORS;  Service: Orthopedics;  Laterality: Right;   TUBAL LIGATION      Social History   Socioeconomic History   Marital status: Married    Spouse name: Not on file   Number of children: Not on file   Years of education: Not on file   Highest education level: Not on file  Occupational History   Not on file  Tobacco Use   Smoking status: Never   Smokeless tobacco: Never  Vaping Use   Vaping Use: Never used  Substance and Sexual Activity   Alcohol use: No   Drug use: No   Sexual activity: Yes  Other Topics Concern   Not on file  Social History Narrative   Not on file   Social Determinants of Health   Financial Resource Strain: Not on file  Food Insecurity: Not on file  Transportation Needs: Not on file  Physical Activity: Not on file  Stress: Not on file  Social Connections: Not on file  Intimate Partner Violence: Not on file    Family History  Problem Relation Age of Onset   Diabetes Mother    Hypertension Mother    Aneurysm Father    Diabetes Son    Seizures Son    Osteosarcoma Brother    Cancer Brother    Diabetes Brother    Diabetes Maternal Grandfather    Heart disease Maternal Grandfather      Current Outpatient Medications:    Acetaminophen-Caffeine (EXCEDRIN TENSION HEADACHE) 500-65 MG TABS, Take 1 tablet by mouth daily as needed (Headache)., Disp: , Rfl:     albuterol (VENTOLIN HFA) 108 (90 Base) MCG/ACT inhaler, Inhale 2 puffs into the lungs every 4 (four) hours as needed., Disp: 18 g, Rfl: 0   calcitRIOL (ROCALTROL) 0.25 MCG capsule, Take by mouth., Disp: , Rfl:    Calcium Carb-Cholecalciferol 600-400 MG-UNIT CAPS, Take 1 capsule by mouth 2 (two) times daily., Disp: , Rfl:    Cholecalciferol 50 MCG (2000 UT) CAPS, Take 2,000 Units by mouth daily., Disp: , Rfl:    cyclobenzaprine (FLEXERIL) 10 MG tablet, Take 10 mg by mouth at bedtime., Disp: , Rfl:    enalapril-hydrochlorothiazide (VASERETIC) 10-25 MG tablet, Take 1 tablet by mouth daily., Disp: , Rfl:    ferrous sulfate 325 (65 FE) MG tablet, Take 325 mg by mouth daily with breakfast., Disp: , Rfl:  FLUoxetine (PROZAC) 40 MG capsule, Take 40 mg by mouth daily., Disp: , Rfl:    folic acid (FOLVITE) 1 MG tablet, Take 1 mg by mouth daily., Disp: , Rfl:    furosemide (LASIX) 40 MG tablet, Take 60 mg by mouth daily., Disp: , Rfl:    hydroxychloroquine (PLAQUENIL) 200 MG tablet, Take 1 tablet by mouth 2 (two) times daily., Disp: , Rfl:    methotrexate (50 MG/ML) 1 g injection, Inject 25 mg into the vein once a week. .6 ml, Disp: , Rfl:    mirtazapine (REMERON) 30 MG tablet, Take 1 tablet by mouth at bedtime., Disp: , Rfl:    Multiple Vitamins-Minerals (MULTIVITAMIN ADULT PO), Take 1 tablet by mouth daily. , Disp: , Rfl:    omeprazole (PRILOSEC) 40 MG capsule, Take 40 mg by mouth daily., Disp: , Rfl:    pramipexole (MIRAPEX) 0.125 MG tablet, Take 0.125 mg by mouth daily. Take 0.125 mg in the morning and 2 at bedtime, Disp: , Rfl:    rosuvastatin (CRESTOR) 5 MG tablet, Take 5 mg by mouth every other day. , Disp: , Rfl:    spironolactone (ALDACTONE) 25 MG tablet, , Disp: , Rfl:    vitamin B-12 (CYANOCOBALAMIN) 500 MCG tablet, Take 500 mcg by mouth daily., Disp: , Rfl:   Physical exam:  Vitals:   07/07/22 1422  BP: 113/61  Pulse: 81  Resp: 18  Temp: 98 F (36.7 C)  SpO2: 98%  Weight: 192 lb (87.1  kg)   Physical Exam Cardiovascular:     Rate and Rhythm: Normal rate and regular rhythm.     Heart sounds: Normal heart sounds.  Pulmonary:     Effort: Pulmonary effort is normal.     Breath sounds: Normal breath sounds.  Abdominal:     General: Bowel sounds are normal.     Palpations: Abdomen is soft.  Skin:    General: Skin is warm and dry.  Neurological:     Mental Status: She is alert and oriented to person, place, and time.         Latest Ref Rng & Units 07/07/2022    1:59 PM  CMP  Glucose 70 - 99 mg/dL 113   BUN 8 - 23 mg/dL 73   Creatinine 0.44 - 1.00 mg/dL 2.92   Sodium 135 - 145 mmol/L 136   Potassium 3.5 - 5.1 mmol/L 5.1   Chloride 98 - 111 mmol/L 106   CO2 22 - 32 mmol/L 24   Calcium 8.9 - 10.3 mg/dL 9.1   Total Protein 6.5 - 8.1 g/dL 8.0   Total Bilirubin 0.3 - 1.2 mg/dL 0.5   Alkaline Phos 38 - 126 U/L 83   AST 15 - 41 U/L 25   ALT 0 - 44 U/L 19       Latest Ref Rng & Units 07/07/2022    1:59 PM  CBC  WBC 4.0 - 10.5 K/uL 9.5   Hemoglobin 12.0 - 15.0 g/dL 8.4   Hematocrit 36.0 - 46.0 % 27.0   Platelets 150 - 400 K/uL 301       Assessment and plan- Patient is a 71 y.o. female who is here for routine follow-up of anemia   Patient's baseline hemoglobin runs between 9.5-10.5.  Today it is lower at 8.4.  Iron studies show a ferritin level of 63 with an iron saturation of 8%.  Given that she has a component of anemia of chronic kidney disease it could be desirable to keep  iron saturation close to 20% and ferritin close to 100 especially in the light of worsening anemia.  We will therefore plan to proceed with IV iron at this time we will continue to monitor her hemoglobin every 2 months and I will see her back in 6 months with iron studies     Visit Diagnosis 1. Iron deficiency anemia, unspecified iron deficiency anemia type   2. Anemia of chronic kidney failure, stage 3 (moderate) (HCC)      Dr. Randa Evens, MD, MPH [facility] [phone] 07/08/2022 10:57 AM

## 2022-07-09 DIAGNOSIS — R9389 Abnormal findings on diagnostic imaging of other specified body structures: Secondary | ICD-10-CM | POA: Diagnosis not present

## 2022-07-09 DIAGNOSIS — Z7960 Long term (current) use of unspecified immunomodulators and immunosuppressants: Secondary | ICD-10-CM | POA: Diagnosis not present

## 2022-07-09 DIAGNOSIS — Z1159 Encounter for screening for other viral diseases: Secondary | ICD-10-CM | POA: Diagnosis not present

## 2022-07-09 DIAGNOSIS — Z111 Encounter for screening for respiratory tuberculosis: Secondary | ICD-10-CM | POA: Diagnosis not present

## 2022-07-09 DIAGNOSIS — M0609 Rheumatoid arthritis without rheumatoid factor, multiple sites: Secondary | ICD-10-CM | POA: Diagnosis not present

## 2022-07-09 DIAGNOSIS — N1832 Chronic kidney disease, stage 3b: Secondary | ICD-10-CM | POA: Diagnosis not present

## 2022-07-12 LAB — MULTIPLE MYELOMA PANEL, SERUM
Albumin SerPl Elph-Mcnc: 3.2 g/dL (ref 2.9–4.4)
Albumin/Glob SerPl: 0.9 (ref 0.7–1.7)
Alpha 1: 0.3 g/dL (ref 0.0–0.4)
Alpha2 Glob SerPl Elph-Mcnc: 1 g/dL (ref 0.4–1.0)
B-Globulin SerPl Elph-Mcnc: 1.8 g/dL — ABNORMAL HIGH (ref 0.7–1.3)
Gamma Glob SerPl Elph-Mcnc: 1 g/dL (ref 0.4–1.8)
Globulin, Total: 4 g/dL — ABNORMAL HIGH (ref 2.2–3.9)
IgA: 208 mg/dL (ref 87–352)
IgG (Immunoglobin G), Serum: 1690 mg/dL — ABNORMAL HIGH (ref 586–1602)
IgM (Immunoglobulin M), Srm: 117 mg/dL (ref 26–217)
M Protein SerPl Elph-Mcnc: 0.9 g/dL — ABNORMAL HIGH
Total Protein ELP: 7.2 g/dL (ref 6.0–8.5)

## 2022-07-13 DIAGNOSIS — N184 Chronic kidney disease, stage 4 (severe): Secondary | ICD-10-CM | POA: Diagnosis not present

## 2022-07-13 DIAGNOSIS — N2581 Secondary hyperparathyroidism of renal origin: Secondary | ICD-10-CM | POA: Diagnosis not present

## 2022-07-13 DIAGNOSIS — I10 Essential (primary) hypertension: Secondary | ICD-10-CM | POA: Diagnosis not present

## 2022-07-13 DIAGNOSIS — D631 Anemia in chronic kidney disease: Secondary | ICD-10-CM | POA: Diagnosis not present

## 2022-07-14 DIAGNOSIS — M0609 Rheumatoid arthritis without rheumatoid factor, multiple sites: Secondary | ICD-10-CM | POA: Diagnosis not present

## 2022-07-20 ENCOUNTER — Inpatient Hospital Stay: Payer: Medicare HMO | Attending: Oncology

## 2022-07-20 VITALS — BP 113/59 | HR 75 | Temp 97.0°F | Resp 18

## 2022-07-20 DIAGNOSIS — D649 Anemia, unspecified: Secondary | ICD-10-CM

## 2022-07-20 DIAGNOSIS — D631 Anemia in chronic kidney disease: Secondary | ICD-10-CM | POA: Diagnosis not present

## 2022-07-20 DIAGNOSIS — E611 Iron deficiency: Secondary | ICD-10-CM | POA: Diagnosis not present

## 2022-07-20 DIAGNOSIS — Z79899 Other long term (current) drug therapy: Secondary | ICD-10-CM | POA: Insufficient documentation

## 2022-07-20 DIAGNOSIS — N184 Chronic kidney disease, stage 4 (severe): Secondary | ICD-10-CM | POA: Insufficient documentation

## 2022-07-20 MED ORDER — SODIUM CHLORIDE 0.9 % IV SOLN
200.0000 mg | Freq: Once | INTRAVENOUS | Status: AC
  Administered 2022-07-20: 200 mg via INTRAVENOUS
  Filled 2022-07-20: qty 200

## 2022-07-20 MED ORDER — SODIUM CHLORIDE 0.9 % IV SOLN
Freq: Once | INTRAVENOUS | Status: AC
  Filled 2022-07-20: qty 250

## 2022-07-21 MED FILL — Iron Sucrose Inj 20 MG/ML (Fe Equiv): INTRAVENOUS | Qty: 10 | Status: AC

## 2022-07-22 ENCOUNTER — Inpatient Hospital Stay: Payer: Medicare HMO

## 2022-07-22 VITALS — BP 92/47 | HR 82 | Temp 99.0°F | Resp 20

## 2022-07-22 DIAGNOSIS — D649 Anemia, unspecified: Secondary | ICD-10-CM

## 2022-07-22 DIAGNOSIS — D631 Anemia in chronic kidney disease: Secondary | ICD-10-CM | POA: Diagnosis not present

## 2022-07-22 DIAGNOSIS — Z79899 Other long term (current) drug therapy: Secondary | ICD-10-CM | POA: Diagnosis not present

## 2022-07-22 DIAGNOSIS — E611 Iron deficiency: Secondary | ICD-10-CM | POA: Diagnosis not present

## 2022-07-22 DIAGNOSIS — N184 Chronic kidney disease, stage 4 (severe): Secondary | ICD-10-CM | POA: Diagnosis not present

## 2022-07-22 MED ORDER — SODIUM CHLORIDE 0.9 % IV SOLN
INTRAVENOUS | Status: DC
  Filled 2022-07-22: qty 250

## 2022-07-22 MED ORDER — SODIUM CHLORIDE 0.9 % IV SOLN
200.0000 mg | Freq: Once | INTRAVENOUS | Status: AC
  Administered 2022-07-22: 200 mg via INTRAVENOUS
  Filled 2022-07-22: qty 200

## 2022-07-26 ENCOUNTER — Inpatient Hospital Stay: Payer: Medicare HMO

## 2022-07-26 VITALS — BP 117/56 | HR 97 | Temp 98.8°F | Resp 18

## 2022-07-26 DIAGNOSIS — D631 Anemia in chronic kidney disease: Secondary | ICD-10-CM | POA: Diagnosis not present

## 2022-07-26 DIAGNOSIS — D649 Anemia, unspecified: Secondary | ICD-10-CM

## 2022-07-26 DIAGNOSIS — E611 Iron deficiency: Secondary | ICD-10-CM | POA: Diagnosis not present

## 2022-07-26 DIAGNOSIS — N184 Chronic kidney disease, stage 4 (severe): Secondary | ICD-10-CM | POA: Diagnosis not present

## 2022-07-26 DIAGNOSIS — Z79899 Other long term (current) drug therapy: Secondary | ICD-10-CM | POA: Diagnosis not present

## 2022-07-26 MED ORDER — SODIUM CHLORIDE 0.9 % IV SOLN
200.0000 mg | Freq: Once | INTRAVENOUS | Status: AC
  Administered 2022-07-26: 200 mg via INTRAVENOUS
  Filled 2022-07-26: qty 200

## 2022-07-26 MED ORDER — SODIUM CHLORIDE 0.9 % IV SOLN
Freq: Once | INTRAVENOUS | Status: AC
  Filled 2022-07-26: qty 250

## 2022-07-26 MED ORDER — SODIUM CHLORIDE 0.9% FLUSH
10.0000 mL | Freq: Once | INTRAVENOUS | Status: AC | PRN
  Administered 2022-07-26: 10 mL
  Filled 2022-07-26: qty 10

## 2022-07-26 NOTE — Patient Instructions (Signed)

## 2022-07-27 MED FILL — Iron Sucrose Inj 20 MG/ML (Fe Equiv): INTRAVENOUS | Qty: 10 | Status: AC

## 2022-07-28 ENCOUNTER — Inpatient Hospital Stay: Payer: Medicare HMO

## 2022-07-28 VITALS — BP 132/52 | HR 82 | Temp 97.6°F | Resp 18

## 2022-07-28 DIAGNOSIS — N184 Chronic kidney disease, stage 4 (severe): Secondary | ICD-10-CM | POA: Diagnosis not present

## 2022-07-28 DIAGNOSIS — D631 Anemia in chronic kidney disease: Secondary | ICD-10-CM | POA: Diagnosis not present

## 2022-07-28 DIAGNOSIS — E611 Iron deficiency: Secondary | ICD-10-CM | POA: Diagnosis not present

## 2022-07-28 DIAGNOSIS — Z79899 Other long term (current) drug therapy: Secondary | ICD-10-CM | POA: Diagnosis not present

## 2022-07-28 DIAGNOSIS — D649 Anemia, unspecified: Secondary | ICD-10-CM

## 2022-07-28 MED ORDER — SODIUM CHLORIDE 0.9 % IV SOLN
INTRAVENOUS | Status: DC
  Filled 2022-07-28: qty 250

## 2022-07-28 MED ORDER — SODIUM CHLORIDE 0.9 % IV SOLN
200.0000 mg | Freq: Once | INTRAVENOUS | Status: AC
  Administered 2022-07-28: 200 mg via INTRAVENOUS
  Filled 2022-07-28: qty 200

## 2022-07-29 MED FILL — Iron Sucrose Inj 20 MG/ML (Fe Equiv): INTRAVENOUS | Qty: 10 | Status: AC

## 2022-07-30 ENCOUNTER — Inpatient Hospital Stay: Payer: Medicare HMO

## 2022-07-30 VITALS — BP 113/52 | HR 75 | Temp 99.1°F | Resp 16

## 2022-07-30 DIAGNOSIS — E611 Iron deficiency: Secondary | ICD-10-CM | POA: Diagnosis not present

## 2022-07-30 DIAGNOSIS — Z79899 Other long term (current) drug therapy: Secondary | ICD-10-CM | POA: Diagnosis not present

## 2022-07-30 DIAGNOSIS — D649 Anemia, unspecified: Secondary | ICD-10-CM

## 2022-07-30 DIAGNOSIS — N184 Chronic kidney disease, stage 4 (severe): Secondary | ICD-10-CM | POA: Diagnosis not present

## 2022-07-30 DIAGNOSIS — D631 Anemia in chronic kidney disease: Secondary | ICD-10-CM | POA: Diagnosis not present

## 2022-07-30 MED ORDER — SODIUM CHLORIDE 0.9 % IV SOLN
INTRAVENOUS | Status: DC
  Filled 2022-07-30: qty 250

## 2022-07-30 MED ORDER — SODIUM CHLORIDE 0.9 % IV SOLN
200.0000 mg | Freq: Once | INTRAVENOUS | Status: AC
  Administered 2022-07-30: 200 mg via INTRAVENOUS
  Filled 2022-07-30: qty 200

## 2022-07-30 NOTE — Patient Instructions (Signed)

## 2022-08-03 DIAGNOSIS — J849 Interstitial pulmonary disease, unspecified: Secondary | ICD-10-CM | POA: Diagnosis not present

## 2022-08-04 DIAGNOSIS — M19072 Primary osteoarthritis, left ankle and foot: Secondary | ICD-10-CM | POA: Diagnosis not present

## 2022-08-30 NOTE — Progress Notes (Unsigned)
Patient ID: Tonya Cummings, female    DOB: Jun 26, 1951, 71 y.o.   MRN: [id number]  HPI  Tonya Cummings is a 71 y/o female with a history of hyperlipidemia, HTN, CKD, anxiety, chronic pain, depression, GERD, anemia, restless leg syndrome, obstructive sleep apnea and chronic heart failure.   Echo report from 09/30/21 reviewed and showed an EF of 55-60% along with mild MR but without LVH/LAE.   Has not been admitted or been in the ED in the last 6 months.   She presents today for a follow-up visit with a chief complaint of fatigue, SOB, blurred vision, dizziness, and chest pain. She describes this as chronic in nature having been present for several years. She has associated swelling in the lower extremities, light-headedness, anxiety and chronic difficulty sleeping along with this. She denies any abdominal distention, cough, palpitations, nor weight gain. Having weakness and numbness in left hand.   Monitors weight daily. Follows a low-sodium diet and does not add salt.   Wear compression socks daily when weather permits.   Past Medical History:  Diagnosis Date   Anxiety    CHF (congestive heart failure) (HCC)    Chronic kidney disease    Chronic pain    Chronic radicular pain of lower back    Costochondritis    Depression    Encounter for blood transfusion    GERD (gastroesophageal reflux disease)    Headache    migraines   Hip dysplasia    Hyperlipidemia    Hypertension    Iron deficiency anemia    Low back pain    Low vitamin B12 level    Obesity    Osteoporosis    Pelvic fracture (HCC)    Raynaud's disease without gangrene    Restless leg syndrome    Rheumatoid aortitis    Rheumatoid arthritis (HCC)    polyarthritis   Rheumatoid arthritis (HCC)    Seasonal allergies    Sleep disorder    Thoracic compression fracture ([place])    Past Surgical History:  Procedure Laterality Date   ABDOMINAL SURGERY     pt denies   APPENDECTOMY     CARPAL TUNNEL RELEASE Bilateral     CHOLECYSTECTOMY     COLONOSCOPY WITH PROPOFOL N/A 08/15/2020   Procedure: COLONOSCOPY WITH PROPOFOL;  Surgeon: Robert Bellow, MD;  Location: [place];  Service: Endoscopy;  Laterality: N/A;   DILATION AND CURETTAGE OF UTERUS     ESOPHAGOGASTRODUODENOSCOPY (EGD) WITH PROPOFOL N/A 08/15/2020   Procedure: ESOPHAGOGASTRODUODENOSCOPY (EGD) WITH PROPOFOL;  Surgeon: Robert Bellow, MD;  Location: [facility];  Service: Endoscopy;  Laterality: N/A;   FRACTURE SURGERY     hip fracture    FRACTURE SURGERY     pelvic fracture plate    HIP SURGERY Left    KNEE ARTHROPLASTY Right 10/13/2020   Procedure: COMPUTER ASSISTED TOTAL KNEE ARTHROPLASTY - RNFA;  Surgeon: Dereck Leep, MD;  Location: [facility];  Service: Orthopedics;  Laterality: Right;   TUBAL LIGATION     Family History  Problem Relation Age of Onset   Diabetes Mother    Hypertension Mother    Aneurysm Father    Diabetes Son    Seizures Son    Osteosarcoma Brother    Cancer Brother    Diabetes Brother    Diabetes Maternal Grandfather    Heart disease Maternal Grandfather    Social History   Tobacco Use   Smoking status: Never   Smokeless tobacco: Never  Substance  Use Topics   Alcohol use: No   Allergies  Allergen Reactions   Gabapentin Other (See Comments)    Weight gain   Ibuprofen Other (See Comments)    Headache   Prior to Admission medications   Medication Sig Start Date End Date Taking? Authorizing Provider  Acetaminophen-Caffeine (EXCEDRIN TENSION HEADACHE) 500-65 MG TABS Take 1 tablet by mouth daily as needed (Headache).   Yes [provider]  calcitRIOL (ROCALTROL) 0.25 MCG capsule Take by mouth. 05/22/21  Yes [provider]  Calcium Carb-Cholecalciferol 600-400 MG-UNIT CAPS Take 1 capsule by mouth 2 (two) times daily. 01/13/10  Yes [provider]  Cholecalciferol 50 MCG (2000 UT) CAPS Take 2,000 Units by mouth daily. 09/23/09  Yes [provider]   cyclobenzaprine (FLEXERIL) 10 MG tablet Take 10 mg by mouth at bedtime.   Yes [provider]  enalapril-hydrochlorothiazide (VASERETIC) 10-25 MG tablet Take 1 tablet by mouth daily.   Yes [provider]  ferrous sulfate 325 (65 FE) MG tablet Take 325 mg by mouth daily with breakfast.   Yes [provider]  FLUoxetine (PROZAC) 40 MG capsule Take 40 mg by mouth daily.   Yes [provider]  folic acid (FOLVITE) 1 MG tablet Take 1 mg by mouth daily.   Yes [provider]  furosemide (LASIX) 40 MG tablet Take 60 mg by mouth daily. 10/08/21  Yes [provider]  hydroxychloroquine (PLAQUENIL) 200 MG tablet Take 1 tablet by mouth 2 (two) times daily. 06/22/21  Yes [provider]  methotrexate (50 MG/ML) 1 g injection Inject 25 mg into the vein once a week. .6 ml   Yes [provider]  mirtazapine (REMERON) 30 MG tablet Take 1 tablet by mouth at bedtime. 06/29/22  Yes [provider]  Multiple Vitamins-Minerals (MULTIVITAMIN ADULT PO) Take 1 tablet by mouth daily.  09/10/08  Yes [provider]  omeprazole (PRILOSEC) 40 MG capsule Take 40 mg by mouth daily.   Yes [provider]  pramipexole (MIRAPEX) 0.125 MG tablet Take 0.125 mg by mouth daily. Take 0.125 mg in the morning and 2 at bedtime 02/15/18  Yes [provider]  rosuvastatin (CRESTOR) 5 MG tablet Take 5 mg by mouth every other day.  07/18/19  Yes [provider]  spironolactone (ALDACTONE) 25 MG tablet  04/09/21  Yes [provider]  vitamin B-12 (CYANOCOBALAMIN) 500 MCG tablet Take 500 mcg by mouth daily.   Yes [provider]  albuterol (VENTOLIN HFA) 108 (90 Base) MCG/ACT inhaler Inhale 2 puffs into the lungs every 4 (four) hours as needed. Patient not taking: Reported on 09/02/2022 09/23/21   Margarette Canada, NP   Review of Systems  Constitutional:  Positive for fatigue (easily). Negative for appetite change.   HENT:  Positive for voice change. Negative for congestion, postnasal drip and sore throat.   Eyes:  Positive for visual disturbance (blurry vision).  Respiratory:  Positive for shortness of breath (easily). Negative for cough and chest tightness.   Cardiovascular:  Positive for chest pain and leg swelling. Negative for palpitations.  Gastrointestinal:  Negative for abdominal distention and abdominal pain.  Endocrine: Negative.   Genitourinary: Negative.   Musculoskeletal:  Positive for arthralgias (right shoulder/ feet). Negative for back pain.  Skin: Negative.   Allergic/Immunologic: Negative.   Neurological:  Positive for dizziness and light-headedness.  Hematological:  Negative for adenopathy. Does not bruise/bleed easily.  Psychiatric/Behavioral:  Positive for sleep disturbance (sleeping in recliner due to PND).  Negative for dysphoric mood. The patient is nervous/anxious.    Vitals:   09/02/22 0945  BP: 129/60  Pulse: 84  Resp: 14  SpO2: 100%   Wt Readings from Last 3 Encounters:  09/02/22 178 lb 8 oz (81 kg)  07/07/22 192 lb (87.1 kg)  03/05/22 202 lb 8 oz (91.9 kg)    Lab Results  Component Value Date   CREATININE 2.92 (H) 07/07/2022   CREATININE 2.40 (H) 10/02/2021   CREATININE 2.42 (H) 10/01/2021   Physical Exam Vitals and nursing note reviewed.  Constitutional:      Appearance: Normal appearance.  HENT:     Head: Normocephalic and atraumatic.  Cardiovascular:     Rate and Rhythm: Normal rate and regular rhythm.  Pulmonary:     Effort: Pulmonary effort is normal. No respiratory distress.     Breath sounds: No wheezing or rales.  Abdominal:     General: There is no distension.     Palpations: Abdomen is soft.     Tenderness: There is no abdominal tenderness.  Musculoskeletal:        General: No tenderness.     Left hand: Decreased range of motion. Decreased sensation.     Cervical back: Normal range of motion and neck supple.     Right lower leg: Edema (1+  pitting) present.     Left lower leg: Edema (1+ pitting) present.  Skin:    General: Skin is warm and dry.  Neurological:     General: No focal deficit present.     Mental Status: She is alert and oriented to person, place, and time.  Psychiatric:        Mood and Affect: Mood is anxious.        Behavior: Behavior normal.        Thought Content: Thought content normal.   Assessment & Plan:  1: Chronic heart failure with preserved ejection fraction without structural changes- - NYHA class III - euvolemic today - weighing daily; reminded to call for an overnight weight gain of > 2 pounds or a weekly weight gain of >5 pounds; gained 3 pounds overnight on her scale - weight down 25 pounds from last visit here 6 months ago - has been walking some (walked to the office today) and started riding her stationary bike at home for ~ 10 minutes/ day - has been trying to drink 40 ounces of fluid / day  - BNP 09/29/21 was 70.0  2: HTN with CKD- - BP mildly elevated (129/60) - saw nephrology Holley Raring) 07/13/22 - saw PCP (Gauger) 04/21/22 - BMP 07/13/22 reviewed and showed sodium 141, potassium 5.3, creatinine 3.09 and GFR 16  3: Anxiety- - admits that when she feels SOB, her anxiety worsens which then makes her SOB worsen - encouraged her to try and slow her breathing down when she starts to feel short of breath - currently on fluoxetine and mirtazapine  4: Lymphedema- - stage 2 - limited in her ability to exercise due to her shortness of breath - encouraged to elevate them when sitting for long periods of time - wearing compression socks daily   5: Sleep apnea- - saw pulmonology Lanney Gins) 08/03/22   Medication bottles reviewed.   Patient will call when and if she feels she needs to return.

## 2022-09-02 ENCOUNTER — Ambulatory Visit: Payer: Medicare HMO | Attending: Family | Admitting: Family

## 2022-09-02 ENCOUNTER — Encounter: Payer: Self-pay | Admitting: Family

## 2022-09-02 VITALS — BP 129/60 | HR 84 | Resp 14 | Ht 63.0 in | Wt 178.5 lb

## 2022-09-02 DIAGNOSIS — R600 Localized edema: Secondary | ICD-10-CM | POA: Diagnosis not present

## 2022-09-02 DIAGNOSIS — G4733 Obstructive sleep apnea (adult) (pediatric): Secondary | ICD-10-CM | POA: Insufficient documentation

## 2022-09-02 DIAGNOSIS — M7989 Other specified soft tissue disorders: Secondary | ICD-10-CM | POA: Insufficient documentation

## 2022-09-02 DIAGNOSIS — G8929 Other chronic pain: Secondary | ICD-10-CM | POA: Insufficient documentation

## 2022-09-02 DIAGNOSIS — E785 Hyperlipidemia, unspecified: Secondary | ICD-10-CM | POA: Insufficient documentation

## 2022-09-02 DIAGNOSIS — I890 Lymphedema, not elsewhere classified: Secondary | ICD-10-CM | POA: Insufficient documentation

## 2022-09-02 DIAGNOSIS — R0602 Shortness of breath: Secondary | ICD-10-CM | POA: Diagnosis not present

## 2022-09-02 DIAGNOSIS — R42 Dizziness and giddiness: Secondary | ICD-10-CM | POA: Insufficient documentation

## 2022-09-02 DIAGNOSIS — I130 Hypertensive heart and chronic kidney disease with heart failure and stage 1 through stage 4 chronic kidney disease, or unspecified chronic kidney disease: Secondary | ICD-10-CM | POA: Insufficient documentation

## 2022-09-02 DIAGNOSIS — R531 Weakness: Secondary | ICD-10-CM | POA: Diagnosis not present

## 2022-09-02 DIAGNOSIS — F32A Depression, unspecified: Secondary | ICD-10-CM | POA: Insufficient documentation

## 2022-09-02 DIAGNOSIS — I5032 Chronic diastolic (congestive) heart failure: Secondary | ICD-10-CM | POA: Insufficient documentation

## 2022-09-02 DIAGNOSIS — R200 Anesthesia of skin: Secondary | ICD-10-CM | POA: Insufficient documentation

## 2022-09-02 DIAGNOSIS — N189 Chronic kidney disease, unspecified: Secondary | ICD-10-CM | POA: Insufficient documentation

## 2022-09-02 DIAGNOSIS — I10 Essential (primary) hypertension: Secondary | ICD-10-CM | POA: Diagnosis not present

## 2022-09-02 DIAGNOSIS — G2581 Restless legs syndrome: Secondary | ICD-10-CM | POA: Insufficient documentation

## 2022-09-02 DIAGNOSIS — K219 Gastro-esophageal reflux disease without esophagitis: Secondary | ICD-10-CM | POA: Diagnosis not present

## 2022-09-02 DIAGNOSIS — F419 Anxiety disorder, unspecified: Secondary | ICD-10-CM

## 2022-09-02 NOTE — Patient Instructions (Signed)
Continue weighing daily and call for an overnight weight gain of 3 pounds or more or a weekly weight gain of more than 5 pounds.  °

## 2022-09-07 ENCOUNTER — Other Ambulatory Visit: Payer: Self-pay

## 2022-09-07 DIAGNOSIS — D649 Anemia, unspecified: Secondary | ICD-10-CM

## 2022-09-08 ENCOUNTER — Inpatient Hospital Stay: Payer: Medicare HMO | Attending: Oncology

## 2022-09-08 DIAGNOSIS — D509 Iron deficiency anemia, unspecified: Secondary | ICD-10-CM | POA: Diagnosis not present

## 2022-09-08 DIAGNOSIS — D649 Anemia, unspecified: Secondary | ICD-10-CM

## 2022-09-08 DIAGNOSIS — N183 Chronic kidney disease, stage 3 unspecified: Secondary | ICD-10-CM | POA: Diagnosis not present

## 2022-09-08 DIAGNOSIS — D631 Anemia in chronic kidney disease: Secondary | ICD-10-CM | POA: Diagnosis not present

## 2022-09-08 LAB — CBC WITH DIFFERENTIAL/PLATELET
Abs Immature Granulocytes: 0.05 10*3/uL (ref 0.00–0.07)
Basophils Absolute: 0 10*3/uL (ref 0.0–0.1)
Basophils Relative: 1 %
Eosinophils Absolute: 0.2 10*3/uL (ref 0.0–0.5)
Eosinophils Relative: 2 %
HCT: 28.4 % — ABNORMAL LOW (ref 36.0–46.0)
Hemoglobin: 9 g/dL — ABNORMAL LOW (ref 12.0–15.0)
Immature Granulocytes: 1 %
Lymphocytes Relative: 23 %
Lymphs Abs: 2 10*3/uL (ref 0.7–4.0)
MCH: 29.2 pg (ref 26.0–34.0)
MCHC: 31.7 g/dL (ref 30.0–36.0)
MCV: 92.2 fL (ref 80.0–100.0)
Monocytes Absolute: 0.6 10*3/uL (ref 0.1–1.0)
Monocytes Relative: 7 %
Neutro Abs: 5.9 10*3/uL (ref 1.7–7.7)
Neutrophils Relative %: 66 %
Platelets: 278 10*3/uL (ref 150–400)
RBC: 3.08 MIL/uL — ABNORMAL LOW (ref 3.87–5.11)
RDW: 16.6 % — ABNORMAL HIGH (ref 11.5–15.5)
WBC: 8.7 10*3/uL (ref 4.0–10.5)
nRBC: 0 % (ref 0.0–0.2)

## 2022-09-08 LAB — IRON AND TIBC
Iron: 47 ug/dL (ref 28–170)
Saturation Ratios: 18 % (ref 10.4–31.8)
TIBC: 260 ug/dL (ref 250–450)
UIBC: 213 ug/dL

## 2022-09-08 LAB — FERRITIN: Ferritin: 312 ng/mL — ABNORMAL HIGH (ref 11–307)

## 2022-10-05 DIAGNOSIS — N184 Chronic kidney disease, stage 4 (severe): Secondary | ICD-10-CM | POA: Diagnosis not present

## 2022-10-05 DIAGNOSIS — I10 Essential (primary) hypertension: Secondary | ICD-10-CM | POA: Diagnosis not present

## 2022-10-05 DIAGNOSIS — D631 Anemia in chronic kidney disease: Secondary | ICD-10-CM | POA: Diagnosis not present

## 2022-10-05 DIAGNOSIS — N2581 Secondary hyperparathyroidism of renal origin: Secondary | ICD-10-CM | POA: Diagnosis not present

## 2022-10-05 DIAGNOSIS — R809 Proteinuria, unspecified: Secondary | ICD-10-CM | POA: Diagnosis not present

## 2022-10-13 DIAGNOSIS — N184 Chronic kidney disease, stage 4 (severe): Secondary | ICD-10-CM | POA: Diagnosis not present

## 2022-10-13 DIAGNOSIS — Z7960 Long term (current) use of unspecified immunomodulators and immunosuppressants: Secondary | ICD-10-CM | POA: Diagnosis not present

## 2022-10-13 DIAGNOSIS — M0609 Rheumatoid arthritis without rheumatoid factor, multiple sites: Secondary | ICD-10-CM | POA: Diagnosis not present

## 2022-10-13 DIAGNOSIS — M4312 Spondylolisthesis, cervical region: Secondary | ICD-10-CM | POA: Diagnosis not present

## 2022-10-13 DIAGNOSIS — M810 Age-related osteoporosis without current pathological fracture: Secondary | ICD-10-CM | POA: Diagnosis not present

## 2022-10-13 DIAGNOSIS — M542 Cervicalgia: Secondary | ICD-10-CM | POA: Diagnosis not present

## 2022-10-18 DIAGNOSIS — N184 Chronic kidney disease, stage 4 (severe): Secondary | ICD-10-CM | POA: Diagnosis not present

## 2022-10-22 DIAGNOSIS — M810 Age-related osteoporosis without current pathological fracture: Secondary | ICD-10-CM | POA: Diagnosis not present

## 2022-11-01 DIAGNOSIS — N2581 Secondary hyperparathyroidism of renal origin: Secondary | ICD-10-CM | POA: Diagnosis not present

## 2022-11-01 DIAGNOSIS — M810 Age-related osteoporosis without current pathological fracture: Secondary | ICD-10-CM | POA: Diagnosis not present

## 2022-11-01 DIAGNOSIS — N184 Chronic kidney disease, stage 4 (severe): Secondary | ICD-10-CM | POA: Diagnosis not present

## 2022-11-03 ENCOUNTER — Other Ambulatory Visit: Payer: Self-pay

## 2022-11-03 DIAGNOSIS — E538 Deficiency of other specified B group vitamins: Secondary | ICD-10-CM

## 2022-11-03 DIAGNOSIS — D649 Anemia, unspecified: Secondary | ICD-10-CM

## 2022-11-08 ENCOUNTER — Inpatient Hospital Stay: Payer: Medicare HMO | Attending: Oncology

## 2022-11-08 DIAGNOSIS — E538 Deficiency of other specified B group vitamins: Secondary | ICD-10-CM | POA: Diagnosis not present

## 2022-11-08 DIAGNOSIS — Z79899 Other long term (current) drug therapy: Secondary | ICD-10-CM | POA: Insufficient documentation

## 2022-11-08 DIAGNOSIS — D631 Anemia in chronic kidney disease: Secondary | ICD-10-CM | POA: Insufficient documentation

## 2022-11-08 DIAGNOSIS — D509 Iron deficiency anemia, unspecified: Secondary | ICD-10-CM | POA: Insufficient documentation

## 2022-11-08 DIAGNOSIS — N183 Chronic kidney disease, stage 3 unspecified: Secondary | ICD-10-CM | POA: Diagnosis not present

## 2022-11-08 DIAGNOSIS — D649 Anemia, unspecified: Secondary | ICD-10-CM

## 2022-11-08 LAB — IRON AND TIBC
Iron: 42 ug/dL (ref 28–170)
Saturation Ratios: 15 % (ref 10.4–31.8)
TIBC: 280 ug/dL (ref 250–450)
UIBC: 238 ug/dL

## 2022-11-08 LAB — CBC
HCT: 28.2 % — ABNORMAL LOW (ref 36.0–46.0)
Hemoglobin: 8.8 g/dL — ABNORMAL LOW (ref 12.0–15.0)
MCH: 29.8 pg (ref 26.0–34.0)
MCHC: 31.2 g/dL (ref 30.0–36.0)
MCV: 95.6 fL (ref 80.0–100.0)
Platelets: 247 10*3/uL (ref 150–400)
RBC: 2.95 MIL/uL — ABNORMAL LOW (ref 3.87–5.11)
RDW: 15.4 % (ref 11.5–15.5)
WBC: 10.7 10*3/uL — ABNORMAL HIGH (ref 4.0–10.5)
nRBC: 0 % (ref 0.0–0.2)

## 2022-11-08 LAB — FERRITIN: Ferritin: 167 ng/mL (ref 11–307)

## 2022-11-16 ENCOUNTER — Ambulatory Visit
Admission: RE | Admit: 2022-11-16 | Discharge: 2022-11-16 | Disposition: A | Discharge status: Home / Self Care | Payer: Self-pay | Source: Ambulatory Visit | Attending: Orthopedic Surgery | Admitting: Orthopedic Surgery

## 2022-11-16 ENCOUNTER — Other Ambulatory Visit: Payer: Self-pay

## 2022-11-16 DIAGNOSIS — Z049 Encounter for examination and observation for unspecified reason: Secondary | ICD-10-CM

## 2022-11-18 DIAGNOSIS — D631 Anemia in chronic kidney disease: Secondary | ICD-10-CM | POA: Diagnosis not present

## 2022-11-18 DIAGNOSIS — I10 Essential (primary) hypertension: Secondary | ICD-10-CM | POA: Diagnosis not present

## 2022-11-18 DIAGNOSIS — N2581 Secondary hyperparathyroidism of renal origin: Secondary | ICD-10-CM | POA: Diagnosis not present

## 2022-11-18 DIAGNOSIS — N184 Chronic kidney disease, stage 4 (severe): Secondary | ICD-10-CM | POA: Diagnosis not present

## 2022-12-01 DIAGNOSIS — J8410 Pulmonary fibrosis, unspecified: Secondary | ICD-10-CM | POA: Diagnosis not present

## 2022-12-01 NOTE — Progress Notes (Unsigned)
Referring Physician:  Quintin Alto, MD [place],  [place] [address]  Primary Physician:  [name], NP  History of Present Illness: 12/02/2022 [name] has a history of RA, raynaud's, pulmonary fibrosis, CKD 4, heart failure.   History of chronic neck pain with 2-3 month history of constant neck pain with some right arm pain to her elbow. She cannot look up or turn to the right. No left arm pain. She has numbness and tingling in left arm along with weakness. No known injury.   About 2-3 months ago,  she woke up with numbness/tingling and wrist drop on left. This slowly improved but she still has some weakness.   On plaquenil for RA. Had to stop methotrexate due to CKD 4.   Conservative measures:  Physical therapy: no  Multimodal medical therapy including regular antiinflammatories: robaxin, methotrexate, plaquenil  Injections: no epidural steroid injections in her neck. She's had lumbar ESIs in the past.   Past Surgery: no spinal surgery.   Tonya Cummings has symptoms of cervical myelopathy- balance is getting worse. She has dexterity issues especially with left hand.   The symptoms are causing a significant impact on the patient's life.   Review of Systems:  A 10 point review of systems is negative, except for the pertinent positives and negatives detailed in the HPI.  Past Medical History: Past Medical History:  Diagnosis Date   Anxiety    CHF (congestive heart failure) (HCC)    Chronic kidney disease    Chronic pain    Chronic radicular pain of lower back    Costochondritis    Depression    Encounter for blood transfusion    GERD (gastroesophageal reflux disease)    Headache    migraines   Hip dysplasia    Hyperlipidemia    Hypertension    Iron deficiency anemia    Low back pain    Low vitamin B12 level    Obesity    Osteoporosis    Pelvic fracture (HCC)    Raynaud's disease without gangrene    Restless leg  syndrome    Rheumatoid aortitis    Rheumatoid arthritis (HCC)    polyarthritis   Rheumatoid arthritis (HCC)    Seasonal allergies    Sleep disorder    Thoracic compression fracture (HCC)     Past Surgical History: Past Surgical History:  Procedure Laterality Date   ABDOMINAL SURGERY     pt denies   APPENDECTOMY     CARPAL TUNNEL RELEASE Bilateral    CHOLECYSTECTOMY     COLONOSCOPY WITH PROPOFOL N/A 08/15/2020   Procedure: COLONOSCOPY WITH PROPOFOL;  Surgeon: Robert Bellow, MD;  Location: [facility];  Service: Endoscopy;  Laterality: N/A;   DILATION AND CURETTAGE OF UTERUS     ESOPHAGOGASTRODUODENOSCOPY (EGD) WITH PROPOFOL N/A 08/15/2020   Procedure: ESOPHAGOGASTRODUODENOSCOPY (EGD) WITH PROPOFOL;  Surgeon: Robert Bellow, MD;  Location: [facility];  Service: Endoscopy;  Laterality: N/A;   FRACTURE SURGERY     hip fracture    FRACTURE SURGERY     pelvic fracture plate    HIP SURGERY Left    KNEE ARTHROPLASTY Right 10/13/2020   Procedure: COMPUTER ASSISTED TOTAL KNEE ARTHROPLASTY - RNFA;  Surgeon: Dereck Leep, MD;  Location: [facility];  Service: Orthopedics;  Laterality: Right;   TUBAL LIGATION      Allergies: Allergies as of 12/02/2022 - Review Complete 12/02/2022  Allergen Reaction Noted   Gabapentin Other (  See Comments) 12/11/2014   Ibuprofen Other (See Comments) 05/28/2015    Medications: Outpatient Encounter Medications as of 12/02/2022  Medication Sig   Acetaminophen-Caffeine (EXCEDRIN TENSION HEADACHE) 500-65 MG TABS Take 1 tablet by mouth daily as needed (Headache).   calcitRIOL (ROCALTROL) 0.25 MCG capsule Take by mouth.   Calcium Carb-Cholecalciferol 600-400 MG-UNIT CAPS Take 1 capsule by mouth 2 (two) times daily.   Cholecalciferol 50 MCG (2000 UT) CAPS Take 2,000 Units by mouth daily.   cyclobenzaprine (FLEXERIL) 10 MG tablet Take 10 mg by mouth at bedtime.   enalapril-hydrochlorothiazide (VASERETIC) 10-25 MG tablet Take 1 tablet by mouth  daily.   ferrous sulfate 325 (65 FE) MG tablet Take 325 mg by mouth daily with breakfast.   FLUoxetine (PROZAC) 40 MG capsule Take 40 mg by mouth daily.   furosemide (LASIX) 40 MG tablet Take 60 mg by mouth daily.   hydroxychloroquine (PLAQUENIL) 200 MG tablet Take 1 tablet by mouth 2 (two) times daily.   mirtazapine (REMERON) 30 MG tablet Take 1 tablet by mouth at bedtime.   Multiple Vitamins-Minerals (MULTIVITAMIN ADULT PO) Take 1 tablet by mouth daily.    omeprazole (PRILOSEC) 40 MG capsule Take 40 mg by mouth daily.   pramipexole (MIRAPEX) 0.125 MG tablet Take 0.125 mg by mouth daily. Take 0.125 mg in the morning and 2 at bedtime   rosuvastatin (CRESTOR) 5 MG tablet Take 5 mg by mouth every other day.    spironolactone (ALDACTONE) 25 MG tablet    vitamin B-12 (CYANOCOBALAMIN) 500 MCG tablet Take 500 mcg by mouth daily.   [DISCONTINUED] folic acid (FOLVITE) 1 MG tablet Take 1 mg by mouth daily. (Patient not taking: Reported on 12/02/2022)   [DISCONTINUED] methotrexate (50 MG/ML) 1 g injection Inject 25 mg into the vein once a week. .6 ml (Patient not taking: Reported on 12/02/2022)   No facility-administered encounter medications on file as of 12/02/2022.    Social History: Social History   Tobacco Use   Smoking status: Never   Smokeless tobacco: Never  Vaping Use   Vaping Use: Never used  Substance Use Topics   Alcohol use: No   Drug use: No    Family Medical History: Family History  Problem Relation Age of Onset   Diabetes Mother    Hypertension Mother    Aneurysm Father    Diabetes Son    Seizures Son    Osteosarcoma Brother    Cancer Brother    Diabetes Brother    Diabetes Maternal Grandfather    Heart disease Maternal Grandfather     Physical Examination: Vitals:   12/02/22 1356  BP: 128/68    General: Patient is well developed, well nourished, calm, collected, and in no apparent distress. Attention to examination is appropriate.  Respiratory: Patient is  breathing without any difficulty.   NEUROLOGICAL:     Awake, alert, oriented to person, place, and time.  Speech is clear and fluent. Fund of knowledge is appropriate.   Cranial Nerves: Pupils equal round and reactive to light.  Facial tone is symmetric.    Limited ROM of cervical spine. She holds her head to her chest and to the left.   No posterior cervical tenderness. She has bilateral trapezial tenderness.   No abnormal lesions on exposed skin.   Strength: Side Biceps Triceps Deltoid Interossei Grip Wrist Ext. Wrist Flex.  R '5 5 5 5 5 4 5  '$ L '4 5 5 5 5 4 5   '$ Side Iliopsoas Quads Hamstring  PF DF EHL  R '5 5 5 5 5 5  '$ L '5 5 5 5 5 5   '$ Reflexes are 1+ and symmetric at the biceps, triceps, brachioradialis, patella and achilles.   Hoffman's is absent.  Clonus is not present.   Bilateral upper and lower extremity sensation is intact to light touch.     She has slow unsteady gait and ambulates with a cane.    Medical Decision Making  Imaging: Cervical xrays dated 10/13/22:  Staircase slip at C2-C3 and C3-C4, multilevel cervical spondylosis and DDD. She has significant cervical kyphosis.   Radiology report is not available for my review. Xrays reviewed with Dr. Izora Ribas.   Assessment and Plan: Ms. Lapidus is a pleasant 71 y.o. female with RA. History of chronic neck pain with 2-3 month history of constant neck pain with some right arm pain to her elbow. She cannot look up or turn to the right. She has numbness and tingling in left arm along with weakness.   About 2-3 months ago, she woke up with numbness/tingling and wrist drop on left. This slowly improved but still with some weakness.   She has known staircase slip at C2-C3 and C3-C4, multilevel cervical spondylosis and DDD. She has significant kyphosis. She holds her chin to chest looking to left.   Treatment options discussed with Dr. Izora Ribas. Following plan made with patient:   - MRI of cervical spine to evaluate for  cervical stenosis. She has weakness in both arms with balance/dexterity issues.  - CT of cervical spine to evaluate bony anatomy and alignment.  - Follow up with Dr. Izora Ribas after her imaging to discuss further options including surgery. Will schedule his once I get her results back.   I spent a total of 40 minutes in face-to-face and non-face-to-face activities related to this patient's care today including review of outside records, review of imaging, review of symptoms, physical exam, discussion of differential diagnosis, discussion of treatment options, and documentation.   Thank you for involving me in the care of this patient.   Geronimo Boot PA-C Dept. of Neurosurgery

## 2022-12-02 ENCOUNTER — Encounter: Payer: Self-pay | Admitting: Orthopedic Surgery

## 2022-12-02 ENCOUNTER — Encounter: Payer: Self-pay | Admitting: Hematology and Oncology

## 2022-12-02 ENCOUNTER — Ambulatory Visit (INDEPENDENT_AMBULATORY_CARE_PROVIDER_SITE_OTHER): Payer: Medicare HMO | Admitting: Orthopedic Surgery

## 2022-12-02 VITALS — BP 128/68 | Ht 63.0 in | Wt 178.0 lb

## 2022-12-02 DIAGNOSIS — M542 Cervicalgia: Secondary | ICD-10-CM | POA: Diagnosis not present

## 2022-12-02 DIAGNOSIS — M069 Rheumatoid arthritis, unspecified: Secondary | ICD-10-CM | POA: Diagnosis not present

## 2022-12-02 DIAGNOSIS — M4312 Spondylolisthesis, cervical region: Secondary | ICD-10-CM

## 2022-12-02 NOTE — Patient Instructions (Signed)
It was so nice to see you today, I am sorry that you are hurting so much.   You have arthritis in your neck and some of the bones are slid forward on the others. This is likely cause of your pain.   I want to get an MRI of your neck and a CT scan of your neck.   Once we have the results, we will call you to schedule a follow up with Dr. Izora Ribas.   Please do not hesitate to call if you have any questions or concerns. You can also message me in [place].   If you have not heard back about any of the tests/procedures in the next week, please call the office so we can help you get these things scheduled.   Geronimo Boot PA-C [phone]

## 2022-12-16 DIAGNOSIS — M810 Age-related osteoporosis without current pathological fracture: Secondary | ICD-10-CM | POA: Diagnosis not present

## 2022-12-28 DIAGNOSIS — I130 Hypertensive heart and chronic kidney disease with heart failure and stage 1 through stage 4 chronic kidney disease, or unspecified chronic kidney disease: Secondary | ICD-10-CM | POA: Diagnosis not present

## 2022-12-28 DIAGNOSIS — G479 Sleep disorder, unspecified: Secondary | ICD-10-CM | POA: Diagnosis not present

## 2022-12-28 DIAGNOSIS — E782 Mixed hyperlipidemia: Secondary | ICD-10-CM | POA: Diagnosis not present

## 2022-12-28 DIAGNOSIS — M059 Rheumatoid arthritis with rheumatoid factor, unspecified: Secondary | ICD-10-CM | POA: Diagnosis not present

## 2022-12-28 DIAGNOSIS — E669 Obesity, unspecified: Secondary | ICD-10-CM | POA: Diagnosis not present

## 2022-12-28 DIAGNOSIS — Z0000 Encounter for general adult medical examination without abnormal findings: Secondary | ICD-10-CM | POA: Diagnosis not present

## 2022-12-28 DIAGNOSIS — Z1331 Encounter for screening for depression: Secondary | ICD-10-CM | POA: Diagnosis not present

## 2022-12-28 DIAGNOSIS — Z23 Encounter for immunization: Secondary | ICD-10-CM | POA: Diagnosis not present

## 2022-12-28 DIAGNOSIS — Z7409 Other reduced mobility: Secondary | ICD-10-CM | POA: Diagnosis not present

## 2022-12-28 DIAGNOSIS — I509 Heart failure, unspecified: Secondary | ICD-10-CM | POA: Diagnosis not present

## 2022-12-28 DIAGNOSIS — G2581 Restless legs syndrome: Secondary | ICD-10-CM | POA: Diagnosis not present

## 2022-12-28 DIAGNOSIS — Z79899 Other long term (current) drug therapy: Secondary | ICD-10-CM | POA: Diagnosis not present

## 2022-12-28 DIAGNOSIS — G8929 Other chronic pain: Secondary | ICD-10-CM | POA: Diagnosis not present

## 2022-12-28 DIAGNOSIS — F418 Other specified anxiety disorders: Secondary | ICD-10-CM | POA: Diagnosis not present

## 2022-12-28 DIAGNOSIS — N184 Chronic kidney disease, stage 4 (severe): Secondary | ICD-10-CM | POA: Diagnosis not present

## 2022-12-28 IMAGING — NM NM PULMONARY PERF PARTICULATE
1 series · 8 of 8 positions shown · non-contrast
Comparison: Chest radiography same day

CLINICAL DATA: Respiratory failure.  Lower extremity edema.

EXAM:
NUCLEAR MEDICINE PERFUSION LUNG SCAN
TECHNIQUE: Perfusion images were obtained in multiple projections after
intravenous injection of radiopharmaceutical.
Ventilation scans intentionally deferred if perfusion scan and chest
x-ray adequate for interpretation during COVID 19 epidemic.
RADIOPHARMACEUTICALS:  4.33 mCi Nc-LLm MAA IV

[Series 1000: lung perfusion · 1.95mm/px · 4 acquisitions, 8 frames shown]
[im 1/4]
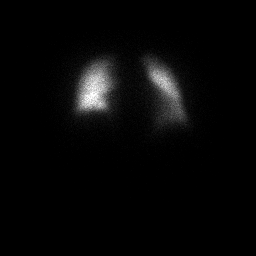
[im 1/4]
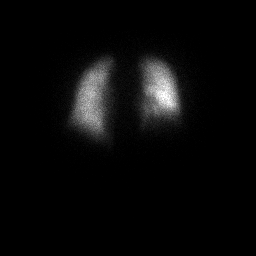
[im 2/4]
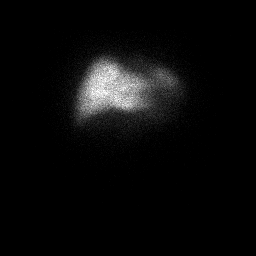
[im 2/4]
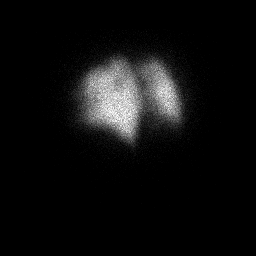
[im 3/4]
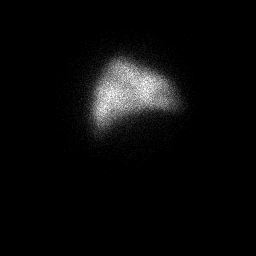
[im 3/4]
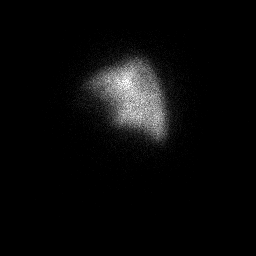
[im 4/4]
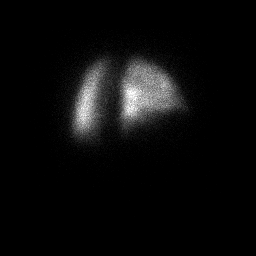
[im 4/4]
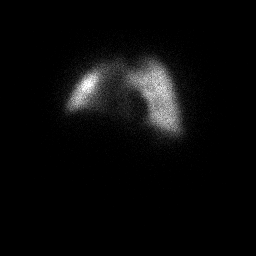

[8 of 8 positions shown; findings below may reference images not displayed]

FINDINGS: Pulmonary perfusion imaging is normal. No finding to suggest
pulmonary emboli.
IMPRESSION: No perfusion defects to suggest pulmonary emboli.

## 2022-12-29 DIAGNOSIS — D631 Anemia in chronic kidney disease: Secondary | ICD-10-CM | POA: Diagnosis not present

## 2022-12-29 DIAGNOSIS — N2581 Secondary hyperparathyroidism of renal origin: Secondary | ICD-10-CM | POA: Diagnosis not present

## 2022-12-29 DIAGNOSIS — I10 Essential (primary) hypertension: Secondary | ICD-10-CM | POA: Diagnosis not present

## 2022-12-29 DIAGNOSIS — N184 Chronic kidney disease, stage 4 (severe): Secondary | ICD-10-CM | POA: Diagnosis not present

## 2022-12-30 DIAGNOSIS — E875 Hyperkalemia: Secondary | ICD-10-CM | POA: Diagnosis not present

## 2022-12-30 DIAGNOSIS — N184 Chronic kidney disease, stage 4 (severe): Secondary | ICD-10-CM | POA: Diagnosis not present

## 2023-01-05 DIAGNOSIS — N184 Chronic kidney disease, stage 4 (severe): Secondary | ICD-10-CM | POA: Diagnosis not present

## 2023-01-07 ENCOUNTER — Other Ambulatory Visit: Payer: Self-pay | Admitting: *Deleted

## 2023-01-07 DIAGNOSIS — D509 Iron deficiency anemia, unspecified: Secondary | ICD-10-CM

## 2023-01-10 ENCOUNTER — Inpatient Hospital Stay (HOSPITAL_BASED_OUTPATIENT_CLINIC_OR_DEPARTMENT_OTHER): Payer: Medicare HMO | Admitting: Nurse Practitioner

## 2023-01-10 ENCOUNTER — Inpatient Hospital Stay: Payer: Medicare HMO | Attending: Oncology

## 2023-01-10 ENCOUNTER — Encounter: Payer: Self-pay | Admitting: Nurse Practitioner

## 2023-01-10 VITALS — BP 129/57 | HR 84 | Temp 96.3°F | Wt 178.0 lb

## 2023-01-10 DIAGNOSIS — D509 Iron deficiency anemia, unspecified: Secondary | ICD-10-CM | POA: Diagnosis not present

## 2023-01-10 DIAGNOSIS — Z8249 Family history of ischemic heart disease and other diseases of the circulatory system: Secondary | ICD-10-CM | POA: Diagnosis not present

## 2023-01-10 DIAGNOSIS — D631 Anemia in chronic kidney disease: Secondary | ICD-10-CM | POA: Insufficient documentation

## 2023-01-10 DIAGNOSIS — Z886 Allergy status to analgesic agent status: Secondary | ICD-10-CM | POA: Insufficient documentation

## 2023-01-10 DIAGNOSIS — R531 Weakness: Secondary | ICD-10-CM | POA: Insufficient documentation

## 2023-01-10 DIAGNOSIS — Z809 Family history of malignant neoplasm, unspecified: Secondary | ICD-10-CM | POA: Diagnosis not present

## 2023-01-10 DIAGNOSIS — E669 Obesity, unspecified: Secondary | ICD-10-CM | POA: Diagnosis not present

## 2023-01-10 DIAGNOSIS — I509 Heart failure, unspecified: Secondary | ICD-10-CM | POA: Diagnosis not present

## 2023-01-10 DIAGNOSIS — Z79899 Other long term (current) drug therapy: Secondary | ICD-10-CM | POA: Insufficient documentation

## 2023-01-10 DIAGNOSIS — Z820 Family history of epilepsy and other diseases of the nervous system: Secondary | ICD-10-CM | POA: Diagnosis not present

## 2023-01-10 DIAGNOSIS — I130 Hypertensive heart and chronic kidney disease with heart failure and stage 1 through stage 4 chronic kidney disease, or unspecified chronic kidney disease: Secondary | ICD-10-CM | POA: Insufficient documentation

## 2023-01-10 DIAGNOSIS — R5383 Other fatigue: Secondary | ICD-10-CM | POA: Insufficient documentation

## 2023-01-10 DIAGNOSIS — Z8269 Family history of other diseases of the musculoskeletal system and connective tissue: Secondary | ICD-10-CM | POA: Diagnosis not present

## 2023-01-10 DIAGNOSIS — K5730 Diverticulosis of large intestine without perforation or abscess without bleeding: Secondary | ICD-10-CM | POA: Insufficient documentation

## 2023-01-10 DIAGNOSIS — Z833 Family history of diabetes mellitus: Secondary | ICD-10-CM | POA: Diagnosis not present

## 2023-01-10 DIAGNOSIS — M542 Cervicalgia: Secondary | ICD-10-CM | POA: Diagnosis not present

## 2023-01-10 DIAGNOSIS — Z888 Allergy status to other drugs, medicaments and biological substances status: Secondary | ICD-10-CM | POA: Diagnosis not present

## 2023-01-10 DIAGNOSIS — Z9049 Acquired absence of other specified parts of digestive tract: Secondary | ICD-10-CM | POA: Insufficient documentation

## 2023-01-10 DIAGNOSIS — N184 Chronic kidney disease, stage 4 (severe): Secondary | ICD-10-CM

## 2023-01-10 LAB — CBC WITH DIFFERENTIAL/PLATELET
Abs Immature Granulocytes: 0.05 10*3/uL (ref 0.00–0.07)
Basophils Absolute: 0 10*3/uL (ref 0.0–0.1)
Basophils Relative: 0 %
Eosinophils Absolute: 0.1 10*3/uL (ref 0.0–0.5)
Eosinophils Relative: 1 %
HCT: 28.2 % — ABNORMAL LOW (ref 36.0–46.0)
Hemoglobin: 8.7 g/dL — ABNORMAL LOW (ref 12.0–15.0)
Immature Granulocytes: 1 %
Lymphocytes Relative: 16 %
Lymphs Abs: 1.5 10*3/uL (ref 0.7–4.0)
MCH: 29 pg (ref 26.0–34.0)
MCHC: 30.9 g/dL (ref 30.0–36.0)
MCV: 94 fL (ref 80.0–100.0)
Monocytes Absolute: 0.8 10*3/uL (ref 0.1–1.0)
Monocytes Relative: 9 %
Neutro Abs: 6.4 10*3/uL (ref 1.7–7.7)
Neutrophils Relative %: 73 %
Platelets: 290 10*3/uL (ref 150–400)
RBC: 3 MIL/uL — ABNORMAL LOW (ref 3.87–5.11)
RDW: 14.3 % (ref 11.5–15.5)
WBC: 8.9 10*3/uL (ref 4.0–10.5)
nRBC: 0 % (ref 0.0–0.2)

## 2023-01-10 LAB — IRON AND TIBC
Iron: 29 ug/dL (ref 28–170)
Saturation Ratios: 10 % — ABNORMAL LOW (ref 10.4–31.8)
TIBC: 287 ug/dL (ref 250–450)
UIBC: 258 ug/dL

## 2023-01-10 LAB — FERRITIN: Ferritin: 153 ng/mL (ref 11–307)

## 2023-01-10 IMAGING — CR DG CHEST 2V
2 series · 2 of 2 positions shown · non-contrast
Comparison: X-ray chest 09/29/2021.

CLINICAL DATA: Shortness of breath and cough for a few months.

EXAM:
CHEST - 2 VIEW

[chest pa]
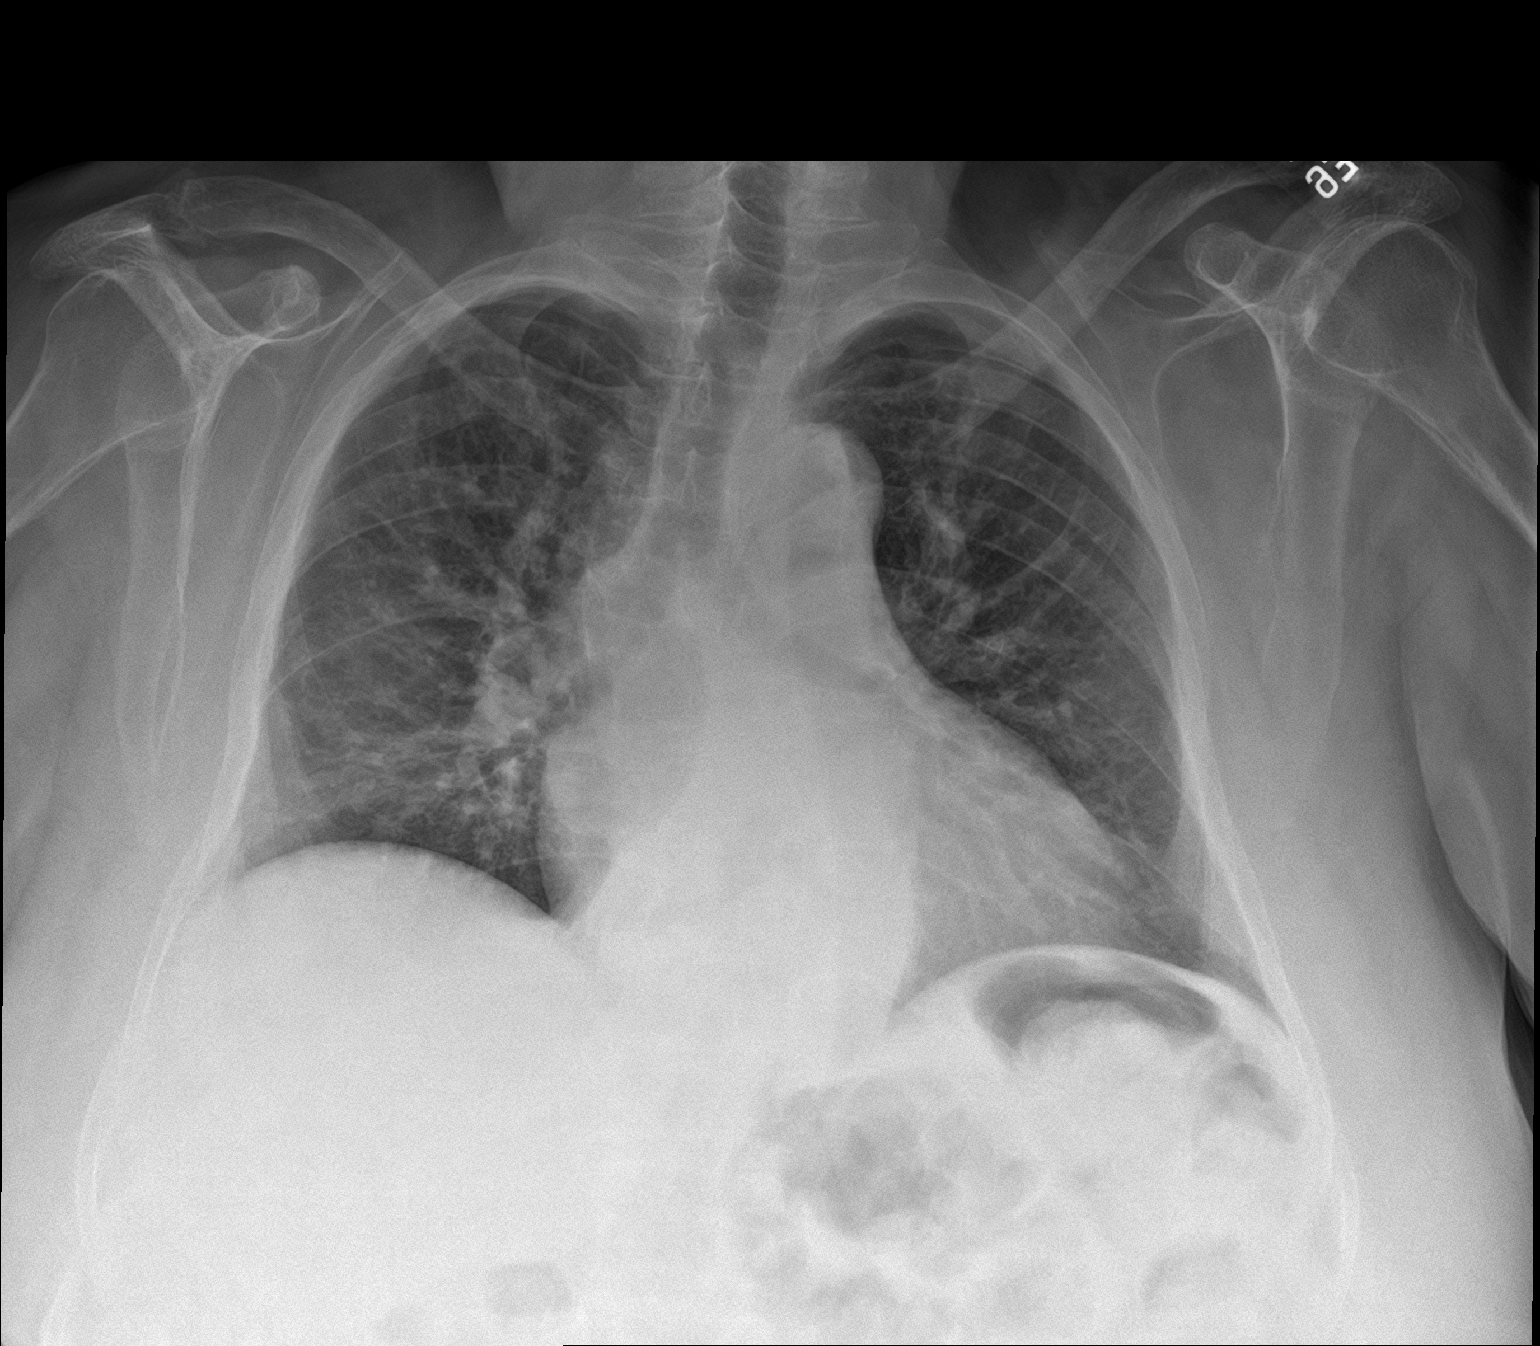

[chest lat]
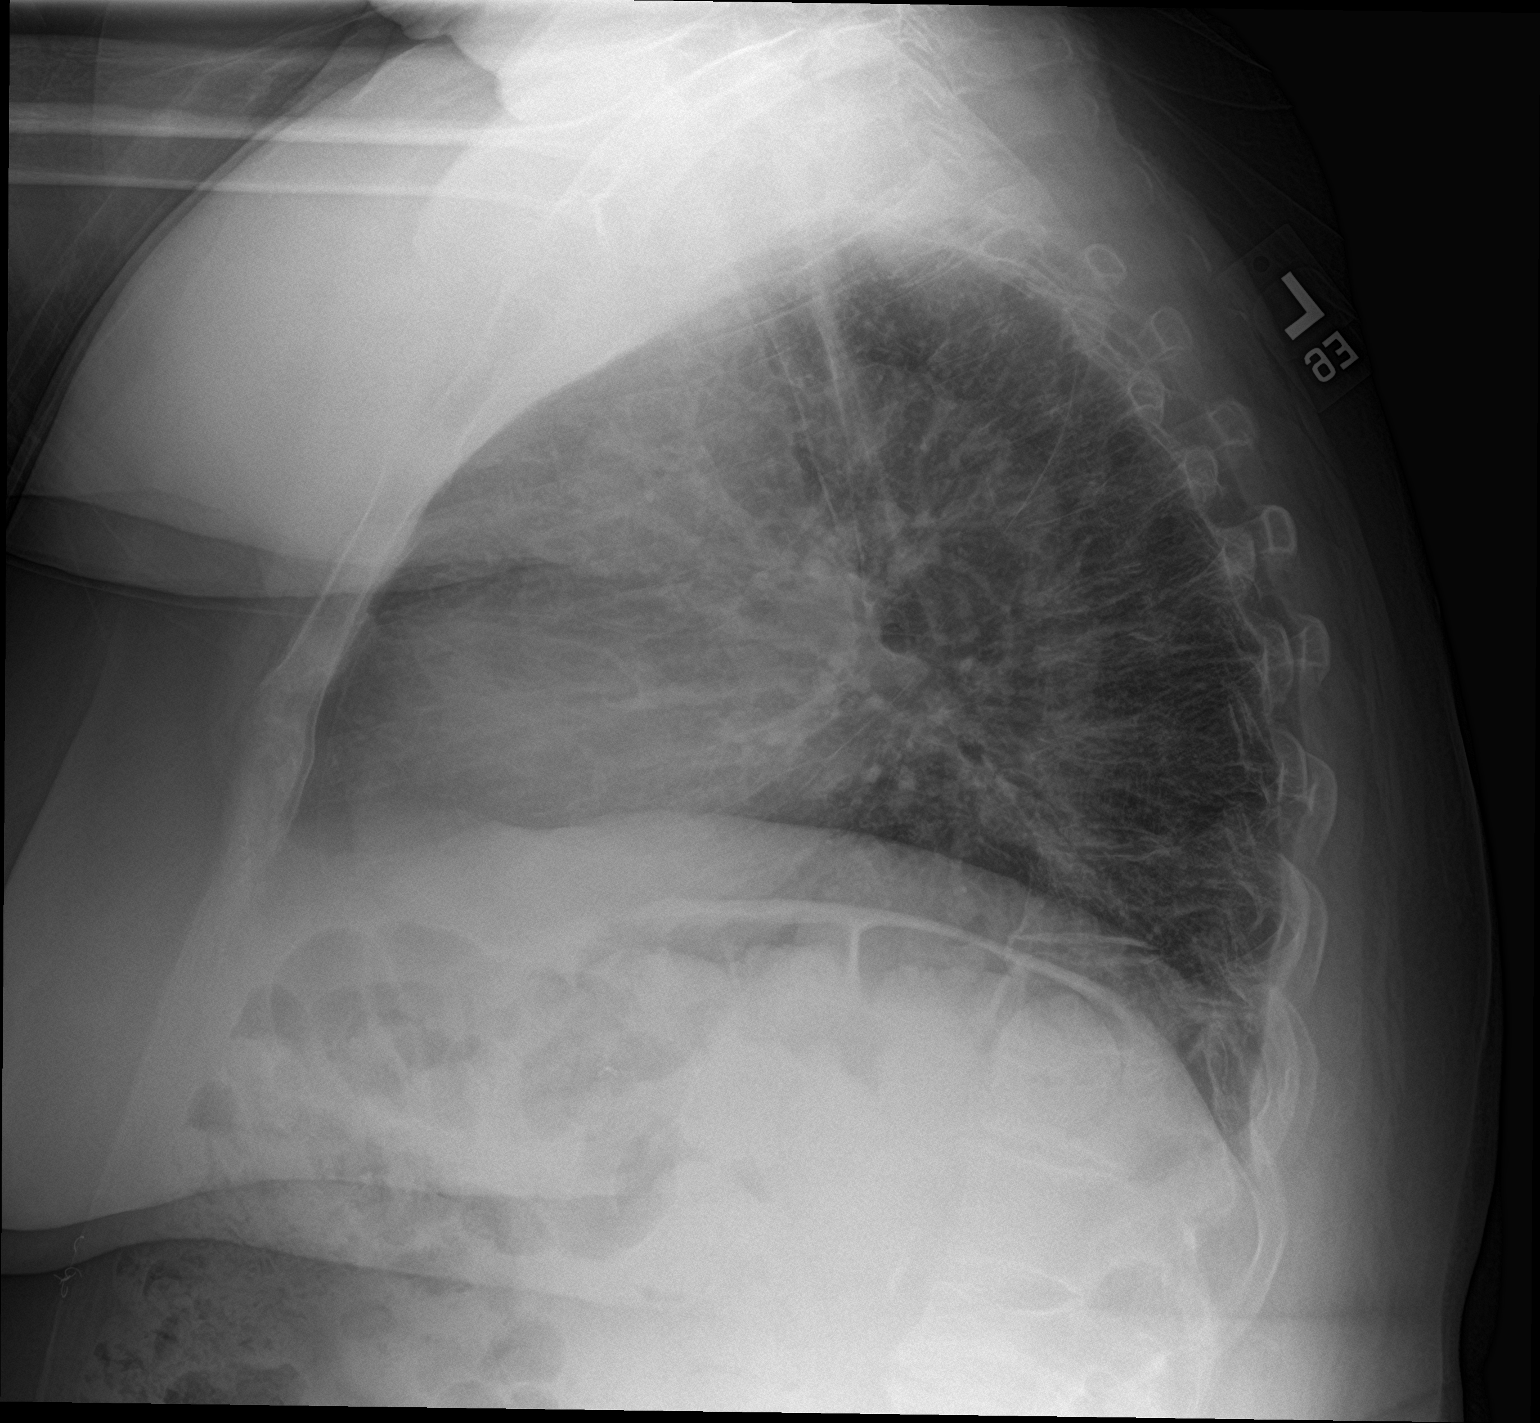

[2 of 2 positions shown; findings below may reference images not displayed]

FINDINGS: Similar cardiomegaly. Mild pulmonary vascular congestion. No pleural
effusion. No acute osseous abnormality.
IMPRESSION: Similar cardiomegaly with pulmonary vascular congestion.

## 2023-01-10 NOTE — Progress Notes (Signed)
Hematology/Oncology Consult Note [facility]  [phone] [phone]  Patient Care Team: [name], NP as PCP - General (Internal Medicine) Candis Shine, MD as Referring Physician (Gastroenterology) Marlowe Sax, MD as Referring Physician (Internal Medicine) Sindy Guadeloupe, MD as Consulting Physician (Hematology and Oncology)   Name of the patient: Tonya Cummings  [id number]  1951-05-27   Date of visit: 01/10/23  Diagnosis- anemia, iron deficiency, anemia of CKD- Anemia of chronic disease related to RA   Chief complaint/ Reason for visit- routine follow-up of anemia  Heme/Onc history: Patient is a 72 year old female with a history of seropositive rheumatoid arthritis for which she is on Plaquenil methotrexate and Humira.  She was seen by Dr. Mike Gip back in September 2020 when her hemoglobin was 8 and she had conclusive evidence of iron deficiency with a ferritin level of 10 and iron saturation of 6%.  She received Venofer back then and her hemoglobin improved to 10 and has remained around that range since then.Colonoscopy on 08/15/2020 revealed one 5 mm polyp in the proximal ascending colon (tubular adenoma). There was diverticulosis in the sigmoid colon. EGD was normal.  B12 and folate have been normal in the past   She has stage III CKD and follows up with Dr. Holley Raring.   Interval history- Patient is 72 year old female with above history of iron deficiency anemia who returns to clinic for follow up. She feels poorly. She has multiple health issues currently and feels overwhelmed. Also fatigued, generally weak. No black or bloody stools. Has recent neck pain and is undergoing neck MRI.   ECOG PS- 1 Pain scale- 4  Review of systems- Review of Systems  Constitutional:  Positive for malaise/fatigue. Negative for chills, fever and weight loss.  HENT:  Negative for congestion, ear discharge and nosebleeds.   Eyes:   Negative for blurred vision.  Respiratory:  Negative for cough, hemoptysis, sputum production, shortness of breath and wheezing.   Cardiovascular:  Negative for chest pain, palpitations, orthopnea and claudication.  Gastrointestinal:  Negative for abdominal pain, blood in stool, constipation, diarrhea, heartburn, melena, nausea and vomiting.  Genitourinary:  Negative for dysuria, flank pain, frequency, hematuria and urgency.  Musculoskeletal:  Positive for neck pain. Negative for back pain, falls, joint pain and myalgias.  Skin:  Negative for rash.  Neurological:  Positive for weakness. Negative for dizziness, tingling, focal weakness, seizures and headaches.  Endo/Heme/Allergies:  Does not bruise/bleed easily.  Psychiatric/Behavioral:  Negative for depression and suicidal ideas. The patient does not have insomnia.      Allergies  Allergen Reactions   Gabapentin Other (See Comments)    Weight gain   Ibuprofen Other (See Comments)    Headache    Past Medical History:  Diagnosis Date   Anxiety    CHF (congestive heart failure) (HCC)    Chronic kidney disease    Chronic pain    Chronic radicular pain of lower back    Costochondritis    Depression    Encounter for blood transfusion    GERD (gastroesophageal reflux disease)    Headache    migraines   Hip dysplasia    Hyperlipidemia    Hypertension    Iron deficiency anemia    Low back pain    Low vitamin B12 level    Obesity    Osteoporosis    Pelvic fracture (HCC)    Raynaud's disease without gangrene    Restless leg syndrome    Rheumatoid aortitis  Rheumatoid arthritis (HCC)    polyarthritis   Rheumatoid arthritis ([place])    Seasonal allergies    Sleep disorder    Thoracic compression fracture [facility])     Past Surgical History:  Procedure Laterality Date   ABDOMINAL SURGERY     pt denies   APPENDECTOMY     CARPAL TUNNEL RELEASE Bilateral    CHOLECYSTECTOMY     COLONOSCOPY WITH PROPOFOL N/A 08/15/2020   Procedure:  COLONOSCOPY WITH PROPOFOL;  Surgeon: Robert Bellow, MD;  Location: [facility];  Service: Endoscopy;  Laterality: N/A;   DILATION AND CURETTAGE OF UTERUS     ESOPHAGOGASTRODUODENOSCOPY (EGD) WITH PROPOFOL N/A 08/15/2020   Procedure: ESOPHAGOGASTRODUODENOSCOPY (EGD) WITH PROPOFOL;  Surgeon: Robert Bellow, MD;  Location: [facility];  Service: Endoscopy;  Laterality: N/A;   FRACTURE SURGERY     hip fracture    FRACTURE SURGERY     pelvic fracture plate    HIP SURGERY Left    KNEE ARTHROPLASTY Right 10/13/2020   Procedure: COMPUTER ASSISTED TOTAL KNEE ARTHROPLASTY - RNFA;  Surgeon: Dereck Leep, MD;  Location: [facility];  Service: Orthopedics;  Laterality: Right;   TUBAL LIGATION      Social History   Socioeconomic History   Marital status: Married    Spouse name: Not on file   Number of children: Not on file   Years of education: Not on file   Highest education level: Not on file  Occupational History   Not on file  Tobacco Use   Smoking status: Never   Smokeless tobacco: Never  Vaping Use   Vaping Use: Never used  Substance and Sexual Activity   Alcohol use: No   Drug use: No   Sexual activity: Yes  Other Topics Concern   Not on file  Social History Narrative   Not on file   Social Determinants of Health   Financial Resource Strain: Not on file  Food Insecurity: Not on file  Transportation Needs: Not on file  Physical Activity: Not on file  Stress: Not on file  Social Connections: Not on file  Intimate Partner Violence: Not on file    Family History  Problem Relation Age of Onset   Diabetes Mother    Hypertension Mother    Aneurysm Father    Diabetes Son    Seizures Son    Osteosarcoma Brother    Cancer Brother    Diabetes Brother    Diabetes Maternal Grandfather    Heart disease Maternal Grandfather      Current Outpatient Medications:    Acetaminophen-Caffeine (EXCEDRIN TENSION HEADACHE) 500-65 MG TABS, Take 1 tablet by mouth daily as  needed (Headache)., Disp: , Rfl:    Calcium Carb-Cholecalciferol 600-400 MG-UNIT CAPS, Take 1 capsule by mouth 2 (two) times daily., Disp: , Rfl:    Cholecalciferol 50 MCG (2000 UT) CAPS, Take 2,000 Units by mouth daily., Disp: , Rfl:    enalapril-hydrochlorothiazide (VASERETIC) 10-25 MG tablet, Take 1 tablet by mouth daily., Disp: , Rfl:    ferrous sulfate 325 (65 FE) MG tablet, Take 325 mg by mouth daily with breakfast., Disp: , Rfl:    furosemide (LASIX) 40 MG tablet, Take 60 mg by mouth daily., Disp: , Rfl:    mirtazapine (REMERON) 30 MG tablet, Take 1 tablet by mouth at bedtime., Disp: , Rfl:    Multiple Vitamins-Minerals (MULTIVITAMIN ADULT PO), Take 1 tablet by mouth daily. , Disp: , Rfl:    omeprazole (PRILOSEC) 40 MG capsule, Take  40 mg by mouth daily., Disp: , Rfl:    Pirfenidone 267 MG TABS, Take by mouth., Disp: , Rfl:    pramipexole (MIRAPEX) 0.125 MG tablet, Take 0.125 mg by mouth daily. Take 0.125 mg in the morning and 2 at bedtime, Disp: , Rfl:    rosuvastatin (CRESTOR) 5 MG tablet, Take 5 mg by mouth every other day. , Disp: , Rfl:    spironolactone (ALDACTONE) 25 MG tablet, , Disp: , Rfl:    vitamin B-12 (CYANOCOBALAMIN) 500 MCG tablet, Take 500 mcg by mouth daily., Disp: , Rfl:    calcitRIOL (ROCALTROL) 0.25 MCG capsule, Take by mouth., Disp: , Rfl:    cyclobenzaprine (FLEXERIL) 10 MG tablet, Take 10 mg by mouth at bedtime., Disp: , Rfl:    FLUoxetine (PROZAC) 40 MG capsule, Take 40 mg by mouth daily. (Patient not taking: Reported on 01/10/2023), Disp: , Rfl:    hydroxychloroquine (PLAQUENIL) 200 MG tablet, Take 1 tablet by mouth 2 (two) times daily., Disp: , Rfl:   Physical exam:  Vitals:   01/10/23 1123  BP: (!) 129/57  Pulse: 84  Temp: (!) 96.3 F (35.7 C)  TempSrc: Tympanic  SpO2: 100%  Weight: 178 lb (80.7 kg)   Physical Exam Constitutional:      Appearance: She is not ill-appearing.  Cardiovascular:     Rate and Rhythm: Normal rate and regular rhythm.      Heart sounds: Normal heart sounds.  Pulmonary:     Effort: Pulmonary effort is normal.  Abdominal:     General: Bowel sounds are normal.     Palpations: Abdomen is soft.  Musculoskeletal:        General: Deformity present.  Skin:    General: Skin is warm and dry.  Neurological:     Mental Status: She is alert and oriented to person, place, and time.  Psychiatric:        Mood and Affect: Mood normal.        Behavior: Behavior normal.         Latest Ref Rng & Units 07/07/2022    1:59 PM  CMP  Glucose 70 - 99 mg/dL 113   BUN 8 - 23 mg/dL 73   Creatinine 0.44 - 1.00 mg/dL 2.92   Sodium 135 - 145 mmol/L 136   Potassium 3.5 - 5.1 mmol/L 5.1   Chloride 98 - 111 mmol/L 106   CO2 22 - 32 mmol/L 24   Calcium 8.9 - 10.3 mg/dL 9.1   Total Protein 6.5 - 8.1 g/dL 8.0   Total Bilirubin 0.3 - 1.2 mg/dL 0.5   Alkaline Phos 38 - 126 U/L 83   AST 15 - 41 U/L 25   ALT 0 - 44 U/L 19       Latest Ref Rng & Units 11/08/2022    1:28 PM  CBC  WBC 4.0 - 10.5 K/uL 10.7   Hemoglobin 12.0 - 15.0 g/dL 8.8   Hematocrit 36.0 - 46.0 % 28.2   Platelets 150 - 400 K/uL 247    Iron/TIBC/Ferritin/ %Sat    Component Value Date/Time   IRON 29 01/10/2023 1101   TIBC 287 01/10/2023 1101   FERRITIN 153 01/10/2023 1101   IRONPCTSAT 10 (L) 01/10/2023 1101     Assessment and plan- Patient is a 72 y.o. female who is here for routine follow-up   Anemia- multifactorial including component of anemia of chronic disease + anemia of CKD + iron deficiency. . Hemoglobin has been decreased to 8.8.  Last received venofer 07/30/22.  CKD stage 4- We reviewed that in view of her CKD, would recommend keeping iron saturation close to 20% and ferritin closer to 100 to minimize hemoglobin drops. She is followed by Dr. Holley Raring. His note from January 2024 reviewed. Could consider starting ESA. Discussed mechanism of action including needing to optimize iron levels. Also reviewed risks and side effects. Patient agrees to  proceed. Will hold administration for now but may consider starting at next visit if iron levels are optimized and hemoglobin remains < 11.  Iron Deficiency- ferritin 153, iron sat 10%. Recommend venofer x 3.   Disposition: Venofer x 3 2 mo- lab (cbc, cmp, ferritin, iron studies) Day to week later- see Dr Janese Banks or myself, +/- venofer or retacrit- la   Visit Diagnosis 1. Iron deficiency anemia, unspecified iron deficiency anemia type   2. Anemia in stage 4 chronic kidney disease ([place])    Beckey Rutter, DNP, AGNP-C, [facility] [phone] (clinic) 01/10/2023

## 2023-01-12 ENCOUNTER — Ambulatory Visit
Admission: RE | Admit: 2023-01-12 | Discharge: 2023-01-12 | Disposition: A | Discharge status: Home / Self Care | Payer: Medicare HMO | Source: Ambulatory Visit | Attending: Orthopedic Surgery | Admitting: Orthopedic Surgery

## 2023-01-12 DIAGNOSIS — M542 Cervicalgia: Secondary | ICD-10-CM

## 2023-01-12 DIAGNOSIS — M069 Rheumatoid arthritis, unspecified: Secondary | ICD-10-CM

## 2023-01-12 DIAGNOSIS — M4312 Spondylolisthesis, cervical region: Secondary | ICD-10-CM

## 2023-01-12 DIAGNOSIS — M4802 Spinal stenosis, cervical region: Secondary | ICD-10-CM | POA: Diagnosis not present

## 2023-01-13 DIAGNOSIS — E875 Hyperkalemia: Secondary | ICD-10-CM | POA: Diagnosis not present

## 2023-01-13 DIAGNOSIS — N184 Chronic kidney disease, stage 4 (severe): Secondary | ICD-10-CM | POA: Diagnosis not present

## 2023-01-17 ENCOUNTER — Inpatient Hospital Stay: Payer: Medicare HMO | Attending: Oncology

## 2023-01-17 VITALS — BP 104/64 | HR 81 | Temp 97.3°F

## 2023-01-17 DIAGNOSIS — Z79899 Other long term (current) drug therapy: Secondary | ICD-10-CM | POA: Insufficient documentation

## 2023-01-17 DIAGNOSIS — D631 Anemia in chronic kidney disease: Secondary | ICD-10-CM | POA: Insufficient documentation

## 2023-01-17 DIAGNOSIS — N184 Chronic kidney disease, stage 4 (severe): Secondary | ICD-10-CM | POA: Diagnosis not present

## 2023-01-17 DIAGNOSIS — D509 Iron deficiency anemia, unspecified: Secondary | ICD-10-CM

## 2023-01-17 DIAGNOSIS — M810 Age-related osteoporosis without current pathological fracture: Secondary | ICD-10-CM | POA: Diagnosis not present

## 2023-01-17 MED ORDER — SODIUM CHLORIDE 0.9 % IV SOLN
Freq: Once | INTRAVENOUS | Status: AC
  Filled 2023-01-17: qty 250

## 2023-01-17 MED ORDER — SODIUM CHLORIDE 0.9 % IV SOLN
200.0000 mg | Freq: Once | INTRAVENOUS | Status: AC
  Administered 2023-01-17: 200 mg via INTRAVENOUS
  Filled 2023-01-17: qty 200

## 2023-01-24 ENCOUNTER — Inpatient Hospital Stay: Payer: Medicare HMO

## 2023-01-24 VITALS — BP 124/51 | HR 80 | Temp 97.4°F | Resp 18

## 2023-01-24 DIAGNOSIS — D631 Anemia in chronic kidney disease: Secondary | ICD-10-CM | POA: Diagnosis not present

## 2023-01-24 DIAGNOSIS — D509 Iron deficiency anemia, unspecified: Secondary | ICD-10-CM | POA: Diagnosis not present

## 2023-01-24 DIAGNOSIS — Z79899 Other long term (current) drug therapy: Secondary | ICD-10-CM | POA: Diagnosis not present

## 2023-01-24 DIAGNOSIS — N184 Chronic kidney disease, stage 4 (severe): Secondary | ICD-10-CM | POA: Diagnosis not present

## 2023-01-24 MED ORDER — SODIUM CHLORIDE 0.9 % IV SOLN
Freq: Once | INTRAVENOUS | Status: AC
  Filled 2023-01-24: qty 250

## 2023-01-24 MED ORDER — SODIUM CHLORIDE 0.9 % IV SOLN
200.0000 mg | Freq: Once | INTRAVENOUS | Status: AC
  Administered 2023-01-24: 200 mg via INTRAVENOUS
  Filled 2023-01-24: qty 200

## 2023-01-31 ENCOUNTER — Inpatient Hospital Stay: Payer: Medicare HMO

## 2023-01-31 VITALS — BP 136/62 | HR 83 | Temp 98.8°F | Resp 16

## 2023-01-31 DIAGNOSIS — Z79899 Other long term (current) drug therapy: Secondary | ICD-10-CM | POA: Diagnosis not present

## 2023-01-31 DIAGNOSIS — N184 Chronic kidney disease, stage 4 (severe): Secondary | ICD-10-CM | POA: Diagnosis not present

## 2023-01-31 DIAGNOSIS — D631 Anemia in chronic kidney disease: Secondary | ICD-10-CM | POA: Diagnosis not present

## 2023-01-31 DIAGNOSIS — D509 Iron deficiency anemia, unspecified: Secondary | ICD-10-CM

## 2023-01-31 MED ORDER — SODIUM CHLORIDE 0.9 % IV SOLN
200.0000 mg | Freq: Once | INTRAVENOUS | Status: AC
  Administered 2023-01-31: 200 mg via INTRAVENOUS
  Filled 2023-01-31: qty 200

## 2023-01-31 MED ORDER — SODIUM CHLORIDE 0.9 % IV SOLN
Freq: Once | INTRAVENOUS | Status: AC
  Filled 2023-01-31: qty 250

## 2023-01-31 NOTE — Patient Instructions (Signed)

## 2023-02-01 DIAGNOSIS — M47812 Spondylosis without myelopathy or radiculopathy, cervical region: Secondary | ICD-10-CM | POA: Diagnosis not present

## 2023-02-01 DIAGNOSIS — M0609 Rheumatoid arthritis without rheumatoid factor, multiple sites: Secondary | ICD-10-CM | POA: Diagnosis not present

## 2023-02-01 DIAGNOSIS — Z79899 Other long term (current) drug therapy: Secondary | ICD-10-CM | POA: Diagnosis not present

## 2023-02-09 DIAGNOSIS — I10 Essential (primary) hypertension: Secondary | ICD-10-CM | POA: Diagnosis not present

## 2023-02-09 DIAGNOSIS — D631 Anemia in chronic kidney disease: Secondary | ICD-10-CM | POA: Diagnosis not present

## 2023-02-09 DIAGNOSIS — N184 Chronic kidney disease, stage 4 (severe): Secondary | ICD-10-CM | POA: Diagnosis not present

## 2023-02-09 DIAGNOSIS — N2581 Secondary hyperparathyroidism of renal origin: Secondary | ICD-10-CM | POA: Diagnosis not present

## 2023-02-09 NOTE — Progress Notes (Unsigned)
Referring Physician:  Geronimo Boot, PA-C [address] [place],  [place] [address]  Primary Physician:  [name], NP  History of Present Illness: 02/10/2023 [name] is here today with a chief complaint of neck pain and change in neck posture.  She has pain into her right arm and hand.  She has intermittent left arm pain.  She has bilateral hand numbness and weakness.  She drops items and has trouble with walking as her vision is not forward.  Her handwriting has changed.  She has been having these issues for 4 to 5 months.  She has severe pain when she tries to look up.  She cannot look straight forward.   Bowel/Bladder Dysfunction: none  Conservative measures:  Physical therapy:  has not participated Multimodal medical therapy including regular antiinflammatories:  robaxin, flexeril, Excedrin Injections:  has not received epidural steroid injections  Past Surgery:  denies  Tonya Cummings has symptoms of cervical myelopathy.  The symptoms are causing a significant impact on the patient's life.   I have utilized the care everywhere function in epic to review the outside records available from external health systems.   Progress Note from Geronimo Boot, [place] on 12/02/22:  History of Present Illness: 12/02/2022 [name] has a history of RA, raynaud's, pulmonary fibrosis, CKD 4, heart failure.    History of chronic neck pain with 2-3 month history of constant neck pain with some right arm pain to her elbow. She cannot look up or turn to the right. No left arm pain. She has numbness and tingling in left arm along with weakness. No known injury.    About 2-3 months ago,  she woke up with numbness/tingling and wrist drop on left. This slowly improved but she still has some weakness.    On plaquenil for RA. Had to stop methotrexate due to CKD 4.   Review of Systems:  A 10 point review of systems is negative, except for the pertinent  positives and negatives detailed in the HPI.  Past Medical History: Past Medical History:  Diagnosis Date   Anxiety    CHF (congestive heart failure) (HCC)    Chronic kidney disease    Chronic pain    Chronic radicular pain of lower back    Costochondritis    Depression    Encounter for blood transfusion    GERD (gastroesophageal reflux disease)    Headache    migraines   Hip dysplasia    Hyperlipidemia    Hypertension    Iron deficiency anemia    Low back pain    Low vitamin B12 level    Obesity    Osteoporosis    Pelvic fracture (HCC)    Raynaud's disease without gangrene    Restless leg syndrome    Rheumatoid aortitis    Rheumatoid arthritis (HCC)    polyarthritis   Rheumatoid arthritis (HCC)    Seasonal allergies    Sleep disorder    Thoracic compression fracture (HCC)     Past Surgical History: Past Surgical History:  Procedure Laterality Date   ABDOMINAL SURGERY     pt denies   APPENDECTOMY     CARPAL TUNNEL RELEASE Bilateral    CHOLECYSTECTOMY     COLONOSCOPY WITH PROPOFOL N/A 08/15/2020   Procedure: COLONOSCOPY WITH PROPOFOL;  Surgeon: Robert Bellow, MD;  Location: [place];  Service: Endoscopy;  Laterality: N/A;   DILATION AND CURETTAGE OF UTERUS  ESOPHAGOGASTRODUODENOSCOPY (EGD) WITH PROPOFOL N/A 08/15/2020   Procedure: ESOPHAGOGASTRODUODENOSCOPY (EGD) WITH PROPOFOL;  Surgeon: Robert Bellow, MD;  Location: [facility];  Service: Endoscopy;  Laterality: N/A;   FRACTURE SURGERY     hip fracture    FRACTURE SURGERY     pelvic fracture plate    HIP SURGERY Left    KNEE ARTHROPLASTY Right 10/13/2020   Procedure: COMPUTER ASSISTED TOTAL KNEE ARTHROPLASTY - RNFA;  Surgeon: Dereck Leep, MD;  Location: [facility];  Service: Orthopedics;  Laterality: Right;   TUBAL LIGATION      Allergies: Allergies as of 02/10/2023 - Review Complete 02/10/2023  Allergen Reaction Noted   Gabapentin Other (See Comments) 12/11/2014   Ibuprofen Other (See  Comments) 05/28/2015    Medications: Current Meds  Medication Sig   venlafaxine XR (EFFEXOR-XR) 150 MG 24 hr capsule Take 150 mg by mouth daily with breakfast.    Social History: Social History   Tobacco Use   Smoking status: Never   Smokeless tobacco: Never  Vaping Use   Vaping Use: Never used  Substance Use Topics   Alcohol use: No   Drug use: No    Family Medical History: Family History  Problem Relation Age of Onset   Diabetes Mother    Hypertension Mother    Aneurysm Father    Diabetes Son    Seizures Son    Osteosarcoma Brother    Cancer Brother    Diabetes Brother    Diabetes Maternal Grandfather    Heart disease Maternal Grandfather     Physical Examination: Vitals:   02/10/23 0946  BP: 127/72  Pulse: 80    General: Patient is well developed, well nourished, calm, collected, and in no apparent distress. Attention to examination is appropriate.  Neck:   She has an obvious coronal plane tilt towards the right.  She cannot look towards the ceiling.  She has limited range of motion.  Respiratory: Patient is breathing without any difficulty.   NEUROLOGICAL:     Awake, alert, oriented to person, place, and time.  Speech is clear and fluent.   Cranial Nerves: Pupils equal round and reactive to light.  Facial tone is symmetric.  Facial sensation is symmetric. Shoulder shrug is symmetric. Tongue protrusion is midline.  There is no pronator drift.  ROM of spine: full.    Strength: Side Biceps Triceps Deltoid Interossei Grip Wrist Ext. Wrist Flex.  R 5 4+'5 4 4 5 5 5  '$ L '5 5 5 5 5 5 5   '$ Side Iliopsoas Quads Hamstring PF DF EHL  R '5 5 5 5 5 5  '$ L '5 5 5 5 5 5   '$ Reflexes are 1+ and symmetric at the biceps, triceps, brachioradialis, patella and achilles.   Hoffman's is absent.   Bilateral upper and lower extremity sensation is intact to light touch.    No evidence of dysmetria noted.  Gait is abnormal and off balance..     Medical Decision  Making  Imaging: MRI C spine 01/12/2023 Cord: Normal cord signal.   Posterior Fossa, vertebral arteries, paraspinal tissues: Visualized vertebral artery flow voids are maintained. Visualized posterior fossa is unremarkable.   Disc levels:   C2-C3: Anterolisthesis. Uncovering the disc and posterior disc osteophyte complex with bilateral facet and uncovertebral hypertrophy. Resulting severe right and mild left foraminal stenosis. Mild to moderate canal stenosis.   C3-C4: Posterior disc osteophyte complex and ligamentum flavum thickening with bilateral facet and uncovertebral hypertrophy. Resulting mild right foraminal stenosis  and moderate canal stenosis. Patent left foramen.   C4-C5: Posterior disc osteophyte complex with right greater than left facet arthropathy and associated right perifacet edema. Resulting mild bilateral foraminal stenosis and mild canal stenosis.   C5-C6: Posterior disc osteophyte complex with ligamentum flavum thickening and bilateral facet and uncovertebral hypertrophy. Resulting moderate bilateral foraminal stenosis and mild canal stenosis.   C6-C7: Posterior disc osteophyte complex with bilateral facet and uncovertebral hypertrophy. Resulting moderate bilateral foraminal stenosis and mild to moderate canal stenosis.   C7-T1: Posterior disc osteophyte complex and bilateral facet arthropathy. No significant stenosis.   IMPRESSION: Severe multilevel degenerative change, including severe right foraminal stenosis at C2-C3, moderate bilateral foraminal stenosis at C5-C6 and C6-C7. Moderate canal stenosis C3-C4 and multilevel mild mild to moderate canal stenosis, as above.     Electronically Signed   By: Margaretha Sheffield M.D.   On: 01/12/2023 16:13  CT C spine 01/12/2023 Disc levels:   C2-3: Disc space narrowing with degenerative endplate changes. Right facet arthrosis. No spinal canal stenosis or neural impingement.   C3-4: Severe right facet  arthropathy. Disc space narrowing with degenerative endplate changes. No spinal canal or neural foraminal stenosis.   C4-5: Disc space narrowing with endplate changes and uncovertebral hypertrophy. No spinal canal or neural foraminal stenosis.   C5-6: Disc space narrowing with endplate changes and uncinate spurring. Mild left foraminal stenosis. No spinal canal stenosis.   C6-7: Degenerative disc disease with uncovertebral spurring. No spinal canal or neural foraminal stenosis.   C7-T1: Unremarkable.   Upper chest: Negative.   Other: None.   IMPRESSION: 1. Multilevel degenerative disc disease and facet arthropathy without high-grade spinal canal or neural foraminal stenosis. 2. Erosive changes of the right C3-4 facet joint, which may be secondary to rheumatoid arthritis.     Electronically Signed   By: Ulyses Jarred M.D.   On: 01/13/2023 00:14  I have personally reviewed the images and agree with the above interpretation.  Assessment and Plan: Ms. Klindt is a pleasant 72 y.o. female with development of severe cervical sagittal plane imbalance with kyphosis over the past 10 years.  She has x-rays from 2014 which showed normal alignment, and now has substantial malalignment with a cervical positive sagittal balance of approximately 5 and half centimeters with cervical kyphosis of 3 degrees measured from C1-C7.  She has anterolisthesis in the cervical spine with severe erosive changes particularly at C3-4.  Given the change on weightbearing versus supine imaging, there is evidence of cervical spinal instability.  Given the change in her alignment over the past 10 years and her current severity of symptoms, physical therapy is not going to substantially change her alignment and allow her to look forward.  I would recommend surgical intervention with an extensive anterior posterior reconstruction including C3-7 anterior cervical discectomy and fusion followed by C2-T2 posterior  fusion.  This would involve use of bone morphogenetic protein.  She has osteoporosis.  She has had 2 treatments with Evenity.  I would like to reach out to her endocrinologist to see whether she feels that her bone health is at a point where this extensive of the surgery could be considered.  Thank you for involving me in the care of this patient.      Jessen Siegman K. Izora Ribas MD, [facility] Neurosurgery

## 2023-02-10 ENCOUNTER — Ambulatory Visit: Payer: Medicare HMO | Admitting: Neurosurgery

## 2023-02-10 ENCOUNTER — Encounter: Payer: Self-pay | Admitting: Neurosurgery

## 2023-02-10 VITALS — BP 127/72 | HR 80 | Ht 63.5 in | Wt 177.0 lb

## 2023-02-10 DIAGNOSIS — M532X2 Spinal instabilities, cervical region: Secondary | ICD-10-CM | POA: Diagnosis not present

## 2023-02-10 DIAGNOSIS — M4312 Spondylolisthesis, cervical region: Secondary | ICD-10-CM | POA: Diagnosis not present

## 2023-02-10 DIAGNOSIS — M5412 Radiculopathy, cervical region: Secondary | ICD-10-CM | POA: Diagnosis not present

## 2023-02-10 DIAGNOSIS — M542 Cervicalgia: Secondary | ICD-10-CM

## 2023-02-10 DIAGNOSIS — M4012 Other secondary kyphosis, cervical region: Secondary | ICD-10-CM

## 2023-02-11 ENCOUNTER — Telehealth: Payer: Self-pay

## 2023-02-11 NOTE — Telephone Encounter (Signed)
FW: guidance Received: Tonya Drop, MD  Berdine Addison, RN [facility].  Please encourage them to go talk with Ms. Patsey Berthold to fulfill her recommendations       Previous Messages    ----- Message ----- From: Mechele [facility]) Sent: 02/10/2023  12:42 PM EST To: Meade Maw, MD [facility]) Subject: RE: guidance  Dr. Izora Ribas,  The severity of Tonya Cummings's osteoporosis is significant due to her history of vertebral and bilateral hip fractures and cervical deformities, which is why Evenity was offered during our discussion. Evenity is the most potent medication available currently for the treatment of osteoporosis and it has the best benefits in improving vertebral BMD. The best course of action would be to complete the 73-monthcourse to fully optimize bone density prior to surgical intervention. The patient hasn't mentioned her financial burdens to me but I hope that if she decides to discontinue therapy with Evenity, then she notify [place] so we may initiate an alternative therapy. If an alternative therapy is started, then my recommendations for timing of her surgical intervention would differ.   - Tonya Cummings   ----- Message ----- From: YMeade Maw([place] Sent: 02/10/2023  10:32 AM EST To: AGermain Osgood MD (DRoyal [place]; CFarm[place] PA ([place] Subject: guidance  Good morning-  We are show this patient who I just saw in consultation.  She has a relatively severe cervical spine deformity that has developed over the past 10 years.  This is causing severe debility for her.  There is no reasonable conservative management for this.  The surgical intervention would be relatively involved with an anterior and posterior reconstruction from C2-T2.  This would be a surgery for most of the day and then hospitalization for 3 to 7 days  afterwards.  I have done the surgeries for several years, but normally want bone density optimized to some degree before surgical intervention.  My normal goal would be a T-score of greater than -2.0, which this patient obviously does not have.  She has been on Evenity and has had 2 treatments.  Her next is scheduled for next week.  This is caused significant financial burden for her.  However, she does seem like she may still take her third treatment.  My question for the to review is how long after her third treatment would you recommend surgical intervention to optimize her bone health?  I would be able to use bone morphogenetic protein to aid in fusion on the posterior aspect, but 1 always wonders and worries about the strength of instrumentation in someone with such significant osteoporosis.  Thanks for the guidance, [employer]

## 2023-02-11 NOTE — Telephone Encounter (Signed)
I spoke with Mrs Minus about Endocrinology's recommendations and encouraged her to discuss things with them. I encouraged her to reach out to [place] when her treatment is completed to discuss surgery.

## 2023-02-17 DIAGNOSIS — M810 Age-related osteoporosis without current pathological fracture: Secondary | ICD-10-CM | POA: Diagnosis not present

## 2023-03-11 ENCOUNTER — Other Ambulatory Visit: Payer: Self-pay | Admitting: *Deleted

## 2023-03-11 ENCOUNTER — Inpatient Hospital Stay: Payer: Medicare HMO | Attending: Oncology

## 2023-03-11 DIAGNOSIS — Z79899 Other long term (current) drug therapy: Secondary | ICD-10-CM | POA: Insufficient documentation

## 2023-03-11 DIAGNOSIS — D509 Iron deficiency anemia, unspecified: Secondary | ICD-10-CM

## 2023-03-11 DIAGNOSIS — D631 Anemia in chronic kidney disease: Secondary | ICD-10-CM | POA: Insufficient documentation

## 2023-03-11 DIAGNOSIS — N184 Chronic kidney disease, stage 4 (severe): Secondary | ICD-10-CM | POA: Diagnosis not present

## 2023-03-11 LAB — CBC WITH DIFFERENTIAL (CANCER CENTER ONLY)
Abs Immature Granulocytes: 0.05 10*3/uL (ref 0.00–0.07)
Basophils Absolute: 0.1 10*3/uL (ref 0.0–0.1)
Basophils Relative: 1 %
Eosinophils Absolute: 0.1 10*3/uL (ref 0.0–0.5)
Eosinophils Relative: 2 %
HCT: 28.7 % — ABNORMAL LOW (ref 36.0–46.0)
Hemoglobin: 8.8 g/dL — ABNORMAL LOW (ref 12.0–15.0)
Immature Granulocytes: 1 %
Lymphocytes Relative: 21 %
Lymphs Abs: 1.7 10*3/uL (ref 0.7–4.0)
MCH: 29 pg (ref 26.0–34.0)
MCHC: 30.7 g/dL (ref 30.0–36.0)
MCV: 94.7 fL (ref 80.0–100.0)
Monocytes Absolute: 0.9 10*3/uL (ref 0.1–1.0)
Monocytes Relative: 11 %
Neutro Abs: 5.2 10*3/uL (ref 1.7–7.7)
Neutrophils Relative %: 64 %
Platelet Count: 268 10*3/uL (ref 150–400)
RBC: 3.03 MIL/uL — ABNORMAL LOW (ref 3.87–5.11)
RDW: 14.5 % (ref 11.5–15.5)
WBC Count: 7.9 10*3/uL (ref 4.0–10.5)
nRBC: 0 % (ref 0.0–0.2)

## 2023-03-11 LAB — IRON AND TIBC
Iron: 38 ug/dL (ref 28–170)
Saturation Ratios: 16 % (ref 10.4–31.8)
TIBC: 237 ug/dL — ABNORMAL LOW (ref 250–450)
UIBC: 199 ug/dL

## 2023-03-11 LAB — FERRITIN: Ferritin: 305 ng/mL (ref 11–307)

## 2023-03-16 ENCOUNTER — Inpatient Hospital Stay: Payer: Medicare HMO | Attending: Oncology | Admitting: Oncology

## 2023-03-16 ENCOUNTER — Encounter: Payer: Self-pay | Admitting: Oncology

## 2023-03-16 ENCOUNTER — Inpatient Hospital Stay: Payer: Medicare HMO

## 2023-03-16 VITALS — BP 110/46 | HR 88 | Temp 98.4°F | Resp 18 | Ht 63.0 in | Wt 173.5 lb

## 2023-03-16 DIAGNOSIS — Z809 Family history of malignant neoplasm, unspecified: Secondary | ICD-10-CM | POA: Insufficient documentation

## 2023-03-16 DIAGNOSIS — Z833 Family history of diabetes mellitus: Secondary | ICD-10-CM | POA: Diagnosis not present

## 2023-03-16 DIAGNOSIS — D122 Benign neoplasm of ascending colon: Secondary | ICD-10-CM | POA: Diagnosis not present

## 2023-03-16 DIAGNOSIS — R778 Other specified abnormalities of plasma proteins: Secondary | ICD-10-CM | POA: Diagnosis not present

## 2023-03-16 DIAGNOSIS — I509 Heart failure, unspecified: Secondary | ICD-10-CM | POA: Insufficient documentation

## 2023-03-16 DIAGNOSIS — D509 Iron deficiency anemia, unspecified: Secondary | ICD-10-CM | POA: Insufficient documentation

## 2023-03-16 DIAGNOSIS — G8929 Other chronic pain: Secondary | ICD-10-CM | POA: Diagnosis not present

## 2023-03-16 DIAGNOSIS — D631 Anemia in chronic kidney disease: Secondary | ICD-10-CM

## 2023-03-16 DIAGNOSIS — Z886 Allergy status to analgesic agent status: Secondary | ICD-10-CM | POA: Diagnosis not present

## 2023-03-16 DIAGNOSIS — Z79899 Other long term (current) drug therapy: Secondary | ICD-10-CM | POA: Diagnosis not present

## 2023-03-16 DIAGNOSIS — K5730 Diverticulosis of large intestine without perforation or abscess without bleeding: Secondary | ICD-10-CM | POA: Insufficient documentation

## 2023-03-16 DIAGNOSIS — Z8249 Family history of ischemic heart disease and other diseases of the circulatory system: Secondary | ICD-10-CM | POA: Diagnosis not present

## 2023-03-16 DIAGNOSIS — M069 Rheumatoid arthritis, unspecified: Secondary | ICD-10-CM | POA: Insufficient documentation

## 2023-03-16 DIAGNOSIS — N184 Chronic kidney disease, stage 4 (severe): Secondary | ICD-10-CM

## 2023-03-16 DIAGNOSIS — E669 Obesity, unspecified: Secondary | ICD-10-CM | POA: Diagnosis not present

## 2023-03-16 DIAGNOSIS — Z9049 Acquired absence of other specified parts of digestive tract: Secondary | ICD-10-CM | POA: Diagnosis not present

## 2023-03-16 DIAGNOSIS — Z888 Allergy status to other drugs, medicaments and biological substances status: Secondary | ICD-10-CM | POA: Insufficient documentation

## 2023-03-16 DIAGNOSIS — I130 Hypertensive heart and chronic kidney disease with heart failure and stage 1 through stage 4 chronic kidney disease, or unspecified chronic kidney disease: Secondary | ICD-10-CM | POA: Insufficient documentation

## 2023-03-16 DIAGNOSIS — Z820 Family history of epilepsy and other diseases of the nervous system: Secondary | ICD-10-CM | POA: Insufficient documentation

## 2023-03-16 MED FILL — Iron Sucrose Inj 20 MG/ML (Fe Equiv): INTRAVENOUS | Qty: 10 | Status: AC

## 2023-03-16 NOTE — Progress Notes (Signed)
No concerns. 

## 2023-03-19 ENCOUNTER — Encounter: Payer: Self-pay | Admitting: Nurse Practitioner

## 2023-03-19 NOTE — Progress Notes (Signed)
Hematology/Oncology Consult note [facility]  [phone] [phone]  Patient Care Team: Myrene Buddy, NP as PCP - General (Internal Medicine) Caryl Ada, MD as Referring Physician (Gastroenterology) Dayna Barker, MD as Referring Physician (Internal Medicine) Creig Hines, MD as Consulting Physician (Hematology and Oncology)   Name of the patient: Tonya Cummings  [id number]  Dec 19, 1950   Date of visit: 03/19/23  Diagnosis-   Anemia secondary to iron deficiency as well as anemia of chronic kidney disease  Chief complaint/ Reason for visit-routine follow-up of anemia  Heme/Onc history: Patient is a 72 year old female with a history of seropositive rheumatoid arthritis for which she is on Plaquenil methotrexate and Humira.  She was seen by Dr. Merlene Pulling back in September 2020 when her hemoglobin was 8 and she had conclusive evidence of iron deficiency with a ferritin level of 10 and iron saturation of 6%.  She received Venofer back then and her hemoglobin improved to 10 and has remained around that range since then.Colonoscopy on 08/15/2020 revealed one 5 mm polyp in the proximal ascending colon (tubular adenoma). There was diverticulosis in the sigmoid colon. EGD was normal.  B12 and folate have been normal in the past.  She has a history of IgG lambda MGUS is being monitored   She has stage III CKD and follows up with Dr. Cherylann Ratel.   Interval history-doing well for her age.  She has chronic joint pain from rheumatoid arthritis but denies any new complaints at time  ECOG PS- 1 Pain scale- 3   Review of systems- Review of Systems  Constitutional:  Positive for malaise/fatigue.  Musculoskeletal:  Positive for joint pain.      Allergies  Allergen Reactions   Gabapentin Other (See Comments)    Weight gain   Ibuprofen Other (See Comments)    Headache     Past Medical History:  Diagnosis Date   Anxiety     CHF (congestive heart failure)    Chronic kidney disease    Chronic pain    Chronic radicular pain of lower back    Costochondritis    Depression    Encounter for blood transfusion    GERD (gastroesophageal reflux disease)    Headache    migraines   Hip dysplasia    Hyperlipidemia    Hypertension    Iron deficiency anemia    Low back pain    Low vitamin B12 level    Obesity    Osteoporosis    Pelvic fracture    Raynaud's disease without gangrene    Restless leg syndrome    Rheumatoid aortitis    Rheumatoid arthritis    polyarthritis   Rheumatoid arthritis    Seasonal allergies    Sleep disorder    Thoracic compression fracture      Past Surgical History:  Procedure Laterality Date   ABDOMINAL SURGERY     pt denies   APPENDECTOMY     CARPAL TUNNEL RELEASE Bilateral    CHOLECYSTECTOMY     COLONOSCOPY WITH PROPOFOL N/A 08/15/2020   Procedure: COLONOSCOPY WITH PROPOFOL;  Surgeon: Earline Mayotte, MD;  Location: [facility];  Service: Endoscopy;  Laterality: N/A;   DILATION AND CURETTAGE OF UTERUS     ESOPHAGOGASTRODUODENOSCOPY (EGD) WITH PROPOFOL N/A 08/15/2020   Procedure: ESOPHAGOGASTRODUODENOSCOPY (EGD) WITH PROPOFOL;  Surgeon: Earline Mayotte, MD;  Location: [facility];  Service: Endoscopy;  Laterality: N/A;   FRACTURE SURGERY     hip fracture  FRACTURE SURGERY     pelvic fracture plate    HIP SURGERY Left    KNEE ARTHROPLASTY Right 10/13/2020   Procedure: COMPUTER ASSISTED TOTAL KNEE ARTHROPLASTY - RNFA;  Surgeon: Donato Heinz, MD;  Location: [facility];  Service: Orthopedics;  Laterality: Right;   TUBAL LIGATION      Social History   Socioeconomic History   Marital status: Married    Spouse name: Not on file   Number of children: Not on file   Years of education: Not on file   Highest education level: Not on file  Occupational History   Not on file  Tobacco Use   Smoking status: Never   Smokeless tobacco: Never  Vaping Use   Vaping  Use: Never used  Substance and Sexual Activity   Alcohol use: No   Drug use: No   Sexual activity: Yes  Other Topics Concern   Not on file  Social History Narrative   Not on file   Social Determinants of Health   Financial Resource Strain: Not on file  Food Insecurity: Not on file  Transportation Needs: Not on file  Physical Activity: Not on file  Stress: Not on file  Social Connections: Not on file  Intimate Partner Violence: Not on file    Family History  Problem Relation Age of Onset   Diabetes Mother    Hypertension Mother    Aneurysm Father    Diabetes Son    Seizures Son    Osteosarcoma Brother    Cancer Brother    Diabetes Brother    Diabetes Maternal Grandfather    Heart disease Maternal Grandfather      Current Outpatient Medications:    Acetaminophen-Caffeine (EXCEDRIN TENSION HEADACHE) 500-65 MG TABS, Take 1 tablet by mouth daily as needed (Headache)., Disp: , Rfl:    calcitRIOL (ROCALTROL) 0.25 MCG capsule, Take by mouth., Disp: , Rfl:    Calcium Carb-Cholecalciferol 600-400 MG-UNIT CAPS, Take 1 capsule by mouth 2 (two) times daily., Disp: , Rfl:    Cholecalciferol 50 MCG (2000 UT) CAPS, Take 2,000 Units by mouth daily., Disp: , Rfl:    cyclobenzaprine (FLEXERIL) 10 MG tablet, Take 10 mg by mouth at bedtime., Disp: , Rfl:    enalapril-hydrochlorothiazide (VASERETIC) 10-25 MG tablet, Take 1 tablet by mouth daily., Disp: , Rfl:    ferrous sulfate 325 (65 FE) MG tablet, Take 325 mg by mouth daily with breakfast., Disp: , Rfl:    furosemide (LASIX) 40 MG tablet, Take 60 mg by mouth daily., Disp: , Rfl:    hydroxychloroquine (PLAQUENIL) 200 MG tablet, Take 1 tablet by mouth 2 (two) times daily., Disp: , Rfl:    mirtazapine (REMERON) 30 MG tablet, Take 1 tablet by mouth at bedtime., Disp: , Rfl:    Multiple Vitamins-Minerals (MULTIVITAMIN ADULT PO), Take 1 tablet by mouth daily. , Disp: , Rfl:    omeprazole (PRILOSEC) 40 MG capsule, Take 40 mg by mouth daily.,  Disp: , Rfl:    Pirfenidone 267 MG TABS, Take by mouth., Disp: , Rfl:    pramipexole (MIRAPEX) 0.125 MG tablet, Take 0.125 mg by mouth daily. Take 0.125 mg in the morning and 2 at bedtime, Disp: , Rfl:    rosuvastatin (CRESTOR) 5 MG tablet, Take 5 mg by mouth every other day. , Disp: , Rfl:    spironolactone (ALDACTONE) 25 MG tablet, , Disp: , Rfl:    venlafaxine XR (EFFEXOR-XR) 150 MG 24 hr capsule, Take 150 mg by mouth daily  with breakfast., Disp: , Rfl:    vitamin B-12 (CYANOCOBALAMIN) 500 MCG tablet, Take 500 mcg by mouth daily., Disp: , Rfl:   Physical exam:  Vitals:   03/16/23 1415  BP: (!) 110/46  Pulse: 88  Resp: 18  Temp: 98.4 F (36.9 C)  TempSrc: Tympanic  SpO2: 98%  Weight: 173 lb 8 oz (78.7 kg)  Height: 5\' 3"  (1.6 m)   Physical Exam Cardiovascular:     Rate and Rhythm: Normal rate and regular rhythm.     Heart sounds: Normal heart sounds.  Pulmonary:     Effort: Pulmonary effort is normal.     Breath sounds: Normal breath sounds.  Skin:    General: Skin is warm and dry.  Neurological:     Mental Status: She is alert and oriented to person, place, and time.         Latest Ref Rng & Units 07/07/2022    1:59 PM  CMP  Glucose 70 - 99 mg/dL 644113   BUN 8 - 23 mg/dL 73   Creatinine 0.340.44 - 1.00 mg/dL 7.422.92   Sodium 595135 - 638145 mmol/L 136   Potassium 3.5 - 5.1 mmol/L 5.1   Chloride 98 - 111 mmol/L 106   CO2 22 - 32 mmol/L 24   Calcium 8.9 - 10.3 mg/dL 9.1   Total Protein 6.5 - 8.1 g/dL 8.0   Total Bilirubin 0.3 - 1.2 mg/dL 0.5   Alkaline Phos 38 - 126 U/L 83   AST 15 - 41 U/L 25   ALT 0 - 44 U/L 19       Latest Ref Rng & Units 03/11/2023    1:28 PM  CBC  WBC 4.0 - 10.5 K/uL 7.9   Hemoglobin 12.0 - 15.0 g/dL 8.8   Hematocrit 75.636.0 - 46.0 % 28.7   Platelets 150 - 400 K/uL 268      Assessment and plan- Patient is a 72 y.o. female here for routine follow-up of anemia of chronic kidney disease patient since baseline hemoglobin has been around 10 since January  2023.  However over the last 6 months it has drifted down to less than 9.  PresentlyIt is 8.8.  Iron studies indicate anemia of chronic disease and ferritin levels are normal at 305.  She would therefore not benefit from IV iron at this time.  I am recommending proceeding with Retacrit.  Discussed risks and benefits of Retacrit including all but not limited to possible risk of thromboembolic events.  Aim is to keep her hemoglobin between 10-11 with Retacrit.  Patient is concerned about her co-pay associated with Retacrit and therefore she would like us to get in touch with billing to see what her co-pays and based on that she will decide if she can proceed with that or not.  For now I will see her back in 3 months with labs and hopefully we can proceed with Retacrit if it is financially acceptable for her.  History of IgG lambda MGUS: Will repeat labs again in 3 months time   Visit Diagnosis 1. Anemia in stage 4 chronic kidney disease   2. Abnormal SPEP      Dr. Owens SharkArchana Telecia Larocque, MD, MPH [facility] [phone] 03/19/2023 3:37 PM

## 2023-03-22 DIAGNOSIS — N184 Chronic kidney disease, stage 4 (severe): Secondary | ICD-10-CM | POA: Diagnosis not present

## 2023-03-22 DIAGNOSIS — M810 Age-related osteoporosis without current pathological fracture: Secondary | ICD-10-CM | POA: Diagnosis not present

## 2023-03-22 DIAGNOSIS — N2581 Secondary hyperparathyroidism of renal origin: Secondary | ICD-10-CM | POA: Diagnosis not present

## 2023-03-30 DIAGNOSIS — F418 Other specified anxiety disorders: Secondary | ICD-10-CM | POA: Diagnosis not present

## 2023-03-30 DIAGNOSIS — Z7409 Other reduced mobility: Secondary | ICD-10-CM | POA: Diagnosis not present

## 2023-03-30 DIAGNOSIS — N184 Chronic kidney disease, stage 4 (severe): Secondary | ICD-10-CM | POA: Diagnosis not present

## 2023-03-30 DIAGNOSIS — J8410 Pulmonary fibrosis, unspecified: Secondary | ICD-10-CM | POA: Diagnosis not present

## 2023-03-30 DIAGNOSIS — Z79899 Other long term (current) drug therapy: Secondary | ICD-10-CM | POA: Diagnosis not present

## 2023-04-07 ENCOUNTER — Telehealth: Payer: Self-pay | Admitting: *Deleted

## 2023-04-07 DIAGNOSIS — M25511 Pain in right shoulder: Secondary | ICD-10-CM | POA: Diagnosis not present

## 2023-04-07 DIAGNOSIS — J8410 Pulmonary fibrosis, unspecified: Secondary | ICD-10-CM | POA: Diagnosis not present

## 2023-04-07 DIAGNOSIS — J849 Interstitial pulmonary disease, unspecified: Secondary | ICD-10-CM | POA: Diagnosis not present

## 2023-04-07 DIAGNOSIS — G8911 Acute pain due to trauma: Secondary | ICD-10-CM | POA: Diagnosis not present

## 2023-04-07 NOTE — Telephone Encounter (Signed)
Called the pt. About the cost of retacrit. For about 40 retacrit would  be 64.47, and the administration charge is 66.15 for each time she gets it. I left message that you do not need to pay up front. You can pay some of along the way. Please call if she has questions or wants to do the injections

## 2023-04-14 ENCOUNTER — Encounter: Payer: Self-pay | Admitting: Neurosurgery

## 2023-04-14 NOTE — Telephone Encounter (Signed)
04/26/2023 she confirmed

## 2023-04-20 DIAGNOSIS — M810 Age-related osteoporosis without current pathological fracture: Secondary | ICD-10-CM | POA: Diagnosis not present

## 2023-04-26 ENCOUNTER — Encounter: Payer: Self-pay | Admitting: Neurosurgery

## 2023-04-26 ENCOUNTER — Ambulatory Visit: Payer: Medicare HMO | Admitting: Neurosurgery

## 2023-04-26 ENCOUNTER — Telehealth: Payer: Self-pay | Admitting: *Deleted

## 2023-04-26 VITALS — BP 144/80 | Ht 63.0 in | Wt 169.8 lb

## 2023-04-26 DIAGNOSIS — M542 Cervicalgia: Secondary | ICD-10-CM

## 2023-04-26 DIAGNOSIS — M4312 Spondylolisthesis, cervical region: Secondary | ICD-10-CM

## 2023-04-26 DIAGNOSIS — G959 Disease of spinal cord, unspecified: Secondary | ICD-10-CM

## 2023-04-26 DIAGNOSIS — M532X2 Spinal instabilities, cervical region: Secondary | ICD-10-CM | POA: Diagnosis not present

## 2023-04-26 DIAGNOSIS — M4012 Other secondary kyphosis, cervical region: Secondary | ICD-10-CM | POA: Diagnosis not present

## 2023-04-26 NOTE — Telephone Encounter (Signed)
-----   Message from Verlee Monte, NP sent at 04/26/2023  4:13 PM EDT ----- Regarding: Request for pre-operative cardiac clearance Request for pre-operative cardiac clearance:  1. What type of surgery is being performed?  C3-7 anterior cervical discectomy and fusion (part one); C2-T2 posterior spinal fusion (part 2)  2. When is this surgery scheduled?  06/06/2023 (part 1) and 06/08/2023 (part 2)  3. Type of clearance being requested (medical, pharmacy, both)? MEDICAL   4. Are there any medications that need to be held prior to surgery? NONE  5. Practice name and name of physician performing surgery?  Performing surgeon: Dr. Venetia Night, MD Requesting clearance: Quentin Mulling, FNP-C    6. Anesthesia type (none, local, MAC, general)? GENERAL  7. What is the office phone and fax number?   Phone: [phone] Fax: [phone]  ATTENTION: Unable to create telephone message as per your standard workflow. Directed by [facility] providers to send requests for cardiac clearance to this pool for appropriate distribution to provider covering pre-operative clearances.   Quentin Mulling, MSN, APRN, FNP-C, CEN [facility]  Peri-operative Services Nurse Practitioner Phone: [phone] 04/26/23 4:13 PM

## 2023-04-26 NOTE — Telephone Encounter (Signed)
   Name: Tonya Cummings  DOB: [birth year]  MRN: [id number]  Primary Cardiologist: None  Chart reviewed as part of pre-operative protocol coverage. Because of CINDEE MOHR past medical history and time since last visit, she will require a follow-up in-office visit in order to better assess preoperative cardiovascular risk.  Pre-op covering staff: - Please schedule appointment and call patient to inform them. If patient already had an upcoming appointment within acceptable timeframe, please add "pre-op clearance" to the appointment notes so provider is aware. - Please contact requesting surgeon's office via preferred method (i.e, phone, fax) to inform them of need for appointment prior to surgery.  No medications indicated as needing held.   Sharlene Dory, PA-C  04/26/2023, 4:36 PM

## 2023-04-26 NOTE — Patient Instructions (Signed)
Please see below for information in regards to your upcoming surgery:   Planned surgery: C3-7 anterior cervical discectomy and fusion (part one); C2-T2 posterior spinal fusion (part 2)   Surgery date: 06/06/23 & 06/08/23 - you will find out your arrival time the business day before your surgery.   Pre-op appointment at [facility] Pre-admit Testing: we will call you with a date/time for this. Pre-admit testing is located on the first floor of the [facility], [address] [facility] [address], [address]. Please bring all prescriptions in the original prescription bottles to your appointment, even if you have reviewed medications by phone with a pharmacy representative. Pre-op labs may be done at your pre-op appointment. You are not required to fast for these labs. Should you need to change your pre-op appointment, please call Pre-admit testing at [phone].    Surgical clearance: we will send a clearance request to Lenon Oms, NP, Dr Karna Christmas, and Dr Mariah Milling     Brace: [facility] will contact you regarding an appointment for the brace you will use after surgery. Their number is [phone] should you miss their call or have an issue with your brace after surgery. You will need to bring the brace to the hospital on the day of surgery.    NSAIDS (Non-steroidal anti-inflammatory drugs): because you are having a fusion, no NSAIDS (such as ibuprofen, aleve, naproxen, meloxicam, diclofenac) for 3 months after surgery. Celebrex is an exception. Tylenol is ok because it is not an NSAID.     Because you are having a fusion: for appointments after your 2 week follow-up: please arrive at the [facility] outpatient imaging center ([address], [address], [employer]) or [facility] [employer] one hour prior to your appointment for x-rays. This applies to every appointment after your 2 week follow-up. Failure to do so may result in your appointment being  rescheduled.    We can be reached by phone or mychart 8am-4pm, Monday-Friday. If you have any questions/concerns before or after surgery, you can reach [place] at [phone], or you can send a mychart message. If you have a concern after hours that cannot wait until normal business hours, you can call [phone] and ask to page the neurosurgeon on call for .     Appointments/FMLA & disability paperwork: Patty [employer] Cristin  Nurse: Royston Cowper  Medical assistants: Laurann Montana Physician Assistant's: Manning Charity & Drake Leach Surgeon: Venetia Night, MD

## 2023-04-26 NOTE — Progress Notes (Signed)
Referring Physician:  Myrene Buddy, NP [address] [place],  [place] [address]  Primary Physician:  Myrene Buddy, NP  History of Present Illness: 04/26/2023 Tonya Cummings is here to review her clinical condition.  She has continued severe neck pain.  She has developed radicular symptoms in her left arm including numbness.  02/10/2023 Tonya Cummings is here today with a chief complaint of neck pain and change in neck posture.  She has pain into her right arm and hand.  She has intermittent left arm pain.  She has bilateral hand numbness and weakness.  She drops items and has trouble with walking as her vision is not forward.  Her handwriting has changed.  She has been having these issues for 4 to 5 months.  She has severe pain when she tries to look up.  She cannot look straight forward.   Bowel/Bladder Dysfunction: none  Conservative measures:  Physical therapy:  has not participated Multimodal medical therapy including regular antiinflammatories:  robaxin, flexeril, Excedrin Injections:  has not received epidural steroid injections  Past Surgery:  denies  Tonya Cummings has symptoms of cervical myelopathy.  The symptoms are causing a significant impact on the patient's life.   I have utilized the care everywhere function in epic to review the outside records available from external health systems.   Progress Note from Drake Leach, [place] on 12/02/22:  History of Present Illness: 12/02/2022 Tonya Cummings has a history of RA, raynaud's, pulmonary fibrosis, CKD 4, heart failure.    History of chronic neck pain with 2-3 month history of constant neck pain with some right arm pain to her elbow. She cannot look up or turn to the right. No left arm pain. She has numbness and tingling in left arm along with weakness. No known injury.    About 2-3 months ago,  she woke up with numbness/tingling and wrist drop on left. This slowly improved but she still has  some weakness.    On plaquenil for RA. Had to stop methotrexate due to CKD 4.   Review of Systems:  A 10 point review of systems is negative, except for the pertinent positives and negatives detailed in the HPI.  Past Medical History: Past Medical History:  Diagnosis Date   Anxiety    CHF (congestive heart failure) (HCC)    Chronic kidney disease    Chronic pain    Chronic radicular pain of lower back    Costochondritis    Depression    Encounter for blood transfusion    GERD (gastroesophageal reflux disease)    Headache    migraines   Hip dysplasia    Hyperlipidemia    Hypertension    Iron deficiency anemia    Low back pain    Low vitamin B12 level    Obesity    Osteoporosis    Pelvic fracture (HCC)    Raynaud's disease without gangrene    Restless leg syndrome    Rheumatoid aortitis    Rheumatoid arthritis (HCC)    polyarthritis   Rheumatoid arthritis (HCC)    Seasonal allergies    Sleep disorder    Thoracic compression fracture (HCC)     Past Surgical History: Past Surgical History:  Procedure Laterality Date   ABDOMINAL SURGERY     pt denies   APPENDECTOMY     CARPAL TUNNEL RELEASE Bilateral    CHOLECYSTECTOMY     COLONOSCOPY WITH PROPOFOL N/A 08/15/2020   Procedure:  COLONOSCOPY WITH PROPOFOL;  Surgeon: Earline Mayotte, MD;  Location: [facility] END[facility];  Service: Endoscopy;  Laterality: N/A;   DILATION AND CURETTAGE OF UTERUS     ESOPHAGOGASTRODUODENOSCOPY (EGD) WITH PROPOFOL N/A 08/15/2020   Procedure: ESOPHAGOGASTRODUODENOSCOPY (EGD) WITH PROPOFOL;  Surgeon: Earline Mayotte, MD;  Location: [facility] ENDOSCOPY;  Service: Endoscopy;  Laterality: N/A;   FRACTURE SURGERY     hip fracture    FRACTURE SURGERY     pelvic fracture plate    HIP SURGERY Left    KNEE ARTHROPLASTY Right 10/13/2020   Procedure: COMPUTER ASSISTED TOTAL KNEE ARTHROPLASTY - RNFA;  Surgeon: Donato Heinz, MD;  Location: [facility] ORS;  Service: Orthopedics;  Laterality: Right;   TUBAL LIGATION       Allergies: Allergies as of 04/26/2023 - Review Complete 03/16/2023  Allergen Reaction Noted   Gabapentin Other (See Comments) 12/11/2014   Ibuprofen Other (See Comments) 05/28/2015    Medications: No outpatient medications have been marked as taking for the 04/26/23 encounter (Appointment) with Venetia Night, MD.    Social History: Social History   Tobacco Use   Smoking status: Never   Smokeless tobacco: Never  Vaping Use   Vaping Use: Never used  Substance Use Topics   Alcohol use: No   Drug use: No    Family Medical History: Family History  Problem Relation Age of Onset   Diabetes Mother    Hypertension Mother    Aneurysm Father    Diabetes Son    Seizures Son    Osteosarcoma Brother    Cancer Brother    Diabetes Brother    Diabetes Maternal Grandfather    Heart disease Maternal Grandfather     Physical Examination: There were no vitals filed for this visit.   General: Patient is well developed, well nourished, calm, collected, and in no apparent distress. Attention to examination is appropriate.  Neck:   She has an obvious coronal plane tilt towards the right.  She cannot look towards the ceiling.  She has limited range of motion.  Respiratory: Patient is breathing without any difficulty.   NEUROLOGICAL:     Awake, alert, oriented to person, place, and time.  Speech is clear and fluent.   Cranial Nerves: Pupils equal round and reactive to light.  Facial tone is symmetric.  Facial sensation is symmetric. Shoulder shrug is symmetric. Tongue protrusion is midline.  There is no pronator drift.  ROM of spine: full.    Strength: Side Biceps Triceps Deltoid Interossei Grip Wrist Ext. Wrist Flex.  R 5 4+ 4 4 5 5 5   L 5 5 4 5 5 5 5    Side Iliopsoas Quads Hamstring PF DF EHL  R 5 5 5 5 5 5   L 5 5 5 5 5 5    Reflexes are 1+ and symmetric at the biceps, triceps, brachioradialis, patella and achilles.   Hoffman's is absent.   Bilateral upper and  lower extremity sensation is intact to light touch.    No evidence of dysmetria noted.  Gait is abnormal and off balance..     Medical Decision Making  Imaging: MRI C spine 01/12/2023 Cord: Normal cord signal.   Posterior Fossa, vertebral arteries, paraspinal tissues: Visualized vertebral artery flow voids are maintained. Visualized posterior fossa is unremarkable.   Disc levels:   C2-C3: Anterolisthesis. Uncovering the disc and posterior disc osteophyte complex with bilateral facet and uncovertebral hypertrophy. Resulting severe right and mild left foraminal stenosis. Mild to moderate canal stenosis.  C3-C4: Posterior disc osteophyte complex and ligamentum flavum thickening with bilateral facet and uncovertebral hypertrophy. Resulting mild right foraminal stenosis and moderate canal stenosis. Patent left foramen.   C4-C5: Posterior disc osteophyte complex with right greater than left facet arthropathy and associated right perifacet edema. Resulting mild bilateral foraminal stenosis and mild canal stenosis.   C5-C6: Posterior disc osteophyte complex with ligamentum flavum thickening and bilateral facet and uncovertebral hypertrophy. Resulting moderate bilateral foraminal stenosis and mild canal stenosis.   C6-C7: Posterior disc osteophyte complex with bilateral facet and uncovertebral hypertrophy. Resulting moderate bilateral foraminal stenosis and mild to moderate canal stenosis.   C7-T1: Posterior disc osteophyte complex and bilateral facet arthropathy. No significant stenosis.   IMPRESSION: Severe multilevel degenerative change, including severe right foraminal stenosis at C2-C3, moderate bilateral foraminal stenosis at C5-C6 and C6-C7. Moderate canal stenosis C3-C4 and multilevel mild mild to moderate canal stenosis, as above.     Electronically Signed   By: Feliberto Harts M.D.   On: 01/12/2023 16:13  CT C spine 01/12/2023 Disc levels:   C2-3: Disc  space narrowing with degenerative endplate changes. Right facet arthrosis. No spinal canal stenosis or neural impingement.   C3-4: Severe right facet arthropathy. Disc space narrowing with degenerative endplate changes. No spinal canal or neural foraminal stenosis.   C4-5: Disc space narrowing with endplate changes and uncovertebral hypertrophy. No spinal canal or neural foraminal stenosis.   C5-6: Disc space narrowing with endplate changes and uncinate spurring. Mild left foraminal stenosis. No spinal canal stenosis.   C6-7: Degenerative disc disease with uncovertebral spurring. No spinal canal or neural foraminal stenosis.   C7-T1: Unremarkable.   Upper chest: Negative.   Other: None.   IMPRESSION: 1. Multilevel degenerative disc disease and facet arthropathy without high-grade spinal canal or neural foraminal stenosis. 2. Erosive changes of the right C3-4 facet joint, which may be secondary to rheumatoid arthritis.     Electronically Signed   By: Deatra Robinson M.D.   On: 01/13/2023 00:14  I have personally reviewed the images and agree with the above interpretation.  Assessment and Plan: Tonya Cummings is a pleasant 72 y.o. female with development of severe cervical sagittal plane imbalance with kyphosis over the past 10 years.  She has x-rays from 2014 which showed normal alignment, and now has substantial malalignment with a cervical positive sagittal balance of approximately 5 and half centimeters with cervical kyphosis of 3 degrees measured from C1-C7.  She has anterolisthesis in the cervical spine with severe erosive changes particularly at C3-4.  Given the change on weightbearing versus supine imaging, there is evidence of cervical spinal instability.  Given the change in her alignment over the past 10 years and her current severity of symptoms, physical therapy is not going to substantially change her alignment and allow her to look forward.  I would recommend surgical  intervention with an extensive anterior posterior reconstruction including C3-7 anterior cervical discectomy and fusion followed by C2-T2 posterior fusion.  This would involve use of bone morphogenetic protein.  I discussed the planned procedure at length with the patient, including the risks, benefits, alternatives, and indications. The risks discussed include but are not limited to bleeding, infection, need for reoperation, spinal fluid leak, stroke, vision loss, anesthetic complication, coma, paralysis, and even death. I also described in detail that improvement was not guaranteed.  The patient expressed understanding of these risks, and asked that we proceed with surgery. I described the surgery in layman's terms, and gave ample opportunity for  questions, which were answered to the best of my ability.  We will discuss with her other physicians to try to obtain clearance.  She is at significant risk, medical complication.  She has multiple complicating medical conditions which contribute to her condition.  This is a very large surgery with significant possibility of morbidity.  I spent a total of 30 minutes in this patient's care today. This time was spent reviewing pertinent records including imaging studies, obtaining and confirming history, performing a directed evaluation, formulating and discussing my recommendations, and documenting the visit within the medical record.   Thank you for involving me in the care of this patient.      Rafik Koppel K. Myer Haff MD, [facility] Neurosurgery

## 2023-04-26 NOTE — Telephone Encounter (Signed)
I left a message for the pt to call back to schedule an in office appt for pre op clearance.

## 2023-04-27 NOTE — Telephone Encounter (Signed)
Left message on machine for pt to contact the office to schedule in person cardiac clearance f/u/dg

## 2023-04-28 NOTE — Telephone Encounter (Signed)
Pt has appt with Charlsie Quest, NP 05/10/23 for pre op clearance. I will update all parties involved.

## 2023-05-04 ENCOUNTER — Telehealth: Payer: Self-pay | Admitting: Neurosurgery

## 2023-05-04 NOTE — Telephone Encounter (Signed)
Pt called today stating she had spoken with [employer] about her neck brace needed for post-op and they told her they had not received the order/fax for the order yet.

## 2023-05-04 NOTE — Telephone Encounter (Signed)
Patient has been notified

## 2023-05-04 NOTE — Telephone Encounter (Signed)
LVM advised to call back , we just fax the information to [facility] regarding her neck brace

## 2023-05-06 ENCOUNTER — Other Ambulatory Visit: Payer: Self-pay

## 2023-05-06 DIAGNOSIS — Z01818 Encounter for other preprocedural examination: Secondary | ICD-10-CM

## 2023-05-10 ENCOUNTER — Ambulatory Visit: Payer: Medicare HMO | Attending: Cardiology | Admitting: Cardiology

## 2023-05-10 ENCOUNTER — Encounter: Payer: Self-pay | Admitting: Cardiology

## 2023-05-10 VITALS — BP 124/66 | HR 92 | Ht 63.0 in | Wt 171.6 lb

## 2023-05-10 DIAGNOSIS — Z01810 Encounter for preprocedural cardiovascular examination: Secondary | ICD-10-CM | POA: Diagnosis not present

## 2023-05-10 DIAGNOSIS — M549 Dorsalgia, unspecified: Secondary | ICD-10-CM | POA: Diagnosis not present

## 2023-05-10 DIAGNOSIS — N184 Chronic kidney disease, stage 4 (severe): Secondary | ICD-10-CM | POA: Diagnosis not present

## 2023-05-10 DIAGNOSIS — G8929 Other chronic pain: Secondary | ICD-10-CM

## 2023-05-10 DIAGNOSIS — I5032 Chronic diastolic (congestive) heart failure: Secondary | ICD-10-CM | POA: Diagnosis not present

## 2023-05-10 DIAGNOSIS — I10 Essential (primary) hypertension: Secondary | ICD-10-CM | POA: Diagnosis not present

## 2023-05-10 NOTE — Patient Instructions (Signed)
Medication Instructions:   Your physician recommends that you continue on your current medications as directed. Please refer to the Current Medication list given to you today.  *If you need a refill on your cardiac medications before your next appointment, please call your pharmacy*   Lab Work:  NO lab work ordered today.  If you have labs (blood work) drawn today and your tests are completely normal, you will receive your results only by: MyChart Message (if you have MyChart) OR A paper copy in the mail If you have any lab test that is abnormal or we need to change your treatment, we will call you to review the results.   Testing/Procedures:  No testing ordered today.   Follow-Up: At [facility], you and your health needs are our priority.  As part of our continuing mission to provide you with exceptional heart care, we have created designated Provider Care Teams.  These Care Teams include your primary Cardiologist (physician) and Advanced Practice Providers (APPs -  Physician Assistants and Nurse Practitioners) who all work together to provide you with the care you need, when you need it.  We recommend signing up for the patient portal called "MyChart".  Sign up information is provided on this After Visit Summary.  MyChart is used to connect with patients for Virtual Visits (Telemedicine).  Patients are able to view lab/test results, encounter notes, upcoming appointments, etc.  Non-urgent messages can be sent to your provider as well.   To learn more about what you can do with MyChart, go to [email or web address].    Your next appointment:   12 month(s)  Provider:   You may see or one of the following Advanced Practice Providers on your designated Care Team:   Nicolasa Ducking, NP Eula Listen, PA-C Cadence Fransico Michael, PA-C Charlsie Quest, NP

## 2023-05-10 NOTE — Progress Notes (Signed)
Cardiology Office Note:   Date:  05/10/2023  ID:  Tonya Cummings, DOB [birth year], MRN [id number]  History of Present Illness:   Tonya Cummings is a 72 y.o. female with past medical history of chronic diastolic congestive heart failure, anemia, obesity, CKD, chronic back pain, who follows up today for follow-up of her diastolic congestive heart failure as well and has required preoperative clearance.  She was hospitalized in October 2020 for shortness of breath, diastolic CHF, and leg swelling.  She was hypotensive on arrival.  Was treated with IV Lasix.  VQ scan was negative for PE.  Echocardiogram revealed LVEF of 50-55%, G1 DD, no regional wall motion abnormalities.  At that time she continued to be followed by pulmonary and congestive heart failure clinic.  She was last seen in clinic 03/05/2022 by Dr. Mariah Milling with continued complaints of shortness of breath with any amount of exertion, lower extremity edema, and sporadic chest discomfort.  No changes made to her medication or further testing that was ordered.  She returns to clinic today stating that from the stand point of her heart she has been doing doing well. She has not done well with the pain she has in her neck and the numbness that she has in the left arm and hand. She is scheduled to undergo cervical spinal fusion for the symptoms. She denies any chest pain, shortness of breath, palpitations, or peripheral edema. Continues to follow with Nephrology for her CKD stage IV. She has been having an unstable gait and falling due to the issues with her neck. She denies any recent hospitalizations or visits to the emergency department.   ROS: 10 point review of systems has been completed and considered negative with exception of what listed in the HPI  Studies Reviewed:    EKG: Sinus rhythm with sinus arrhythmia rate 98 nonspecific T wave change  TTE 09/30/21  1. Left ventricular ejection fraction, by estimation, is 55 to 60%. The   left ventricle has normal function. The left ventricle has no regional  wall motion abnormalities. Left ventricular diastolic parameters are  consistent with Grade I diastolic  dysfunction (impaired relaxation).   2. Right ventricular systolic function is normal. The right ventricular  size is normal.   3. The mitral valve is normal in structure. Mild mitral valve  regurgitation.   4. The aortic valve is normal in structure. Aortic valve regurgitation is  not visualized.    Risk Assessment/Calculations:              Physical Exam:   VS:  BP 124/66 (BP Location: Left Arm, Patient Position: Sitting, Cuff Size: Normal)   Pulse 92   Ht 5\' 3"  (1.6 m)   Wt 171 lb 9.6 oz (77.8 kg)   SpO2 95%   BMI 30.40 kg/m    Wt Readings from Last 3 Encounters:  05/10/23 171 lb 9.6 oz (77.8 kg)  04/26/23 169 lb 12.8 oz (77 kg)  03/16/23 173 lb 8 oz (78.7 kg)     GEN: Well nourished, well developed in no acute distress NECK: No JVD; No carotid bruits CARDIAC: RRR, no murmurs, rubs, gallops RESPIRATORY:  Clear to auscultation without rales, wheezing or rhonchi  ABDOMEN: Soft, non-tender, non-distended EXTREMITIES:  No edema; No deformity   ASSESSMENT AND PLAN:   Chronic diastolic congestive heart failure without symptoms today.  Last echocardiogram was completed in 2020 with an LVEF of 55-60%, no regional wall motion abnormalities, G1 DD, mild mitral valve regurgitation.  Patient appears euvolemic on exam today.  She is continued on furosemide 60 mg daily and spironolactone 25 mg daily.  Essential hypertension with blood pressure today 120/66.  She is continued on furosemide and spironolactone.  Encouraged to continue to monitor pressure 1 to 2 hours post medication administration.  CKD stage IV with a last serum creatinine 2.92 in 2/24, prescription sent to be followed by nephrology.  Chronic back pain with severe neck pain with associated symptoms of numbness and tingling to the bilateral  upper extremities.  She has upcoming surgery with neurosurgery.  Preoperative cardiovascular risk assessment completed.    Ms. Cotey perioperative risk of a major cardiac event is 6.6% according to the Revised Cardiac Risk Index (RCRI).  Therefore, she is at high risk for perioperative complications.   Her functional capacity is poor at 4.64 METs according to the Duke Activity Status Index (DASI). Recommendations: According to ACC/AHA guidelines, no further cardiovascular testing needed.  The patient may proceed to surgery at acceptable risk.    6.   Disposition patient return to clinic to see MD/APP in 1 year or sooner if needed with EKG on return.        Signed, Eran Windish, NP

## 2023-05-24 ENCOUNTER — Other Ambulatory Visit: Payer: Self-pay

## 2023-05-24 ENCOUNTER — Encounter
Admission: RE | Admit: 2023-05-24 | Discharge: 2023-05-24 | Disposition: A | Discharge status: Home / Self Care | Payer: Medicare HMO | Source: Ambulatory Visit | Attending: Neurosurgery | Admitting: Neurosurgery

## 2023-05-24 ENCOUNTER — Encounter (HOSPITAL_COMMUNITY): Payer: Self-pay | Admitting: Urgent Care

## 2023-05-24 VITALS — BP 125/65 | HR 89 | Resp 18 | Ht 63.0 in | Wt 164.0 lb

## 2023-05-24 DIAGNOSIS — E785 Hyperlipidemia, unspecified: Secondary | ICD-10-CM | POA: Insufficient documentation

## 2023-05-24 DIAGNOSIS — Z01818 Encounter for other preprocedural examination: Secondary | ICD-10-CM | POA: Insufficient documentation

## 2023-05-24 DIAGNOSIS — I130 Hypertensive heart and chronic kidney disease with heart failure and stage 1 through stage 4 chronic kidney disease, or unspecified chronic kidney disease: Secondary | ICD-10-CM | POA: Diagnosis not present

## 2023-05-24 DIAGNOSIS — N184 Chronic kidney disease, stage 4 (severe): Secondary | ICD-10-CM | POA: Diagnosis not present

## 2023-05-24 DIAGNOSIS — D509 Iron deficiency anemia, unspecified: Secondary | ICD-10-CM | POA: Insufficient documentation

## 2023-05-24 DIAGNOSIS — D649 Anemia, unspecified: Secondary | ICD-10-CM

## 2023-05-24 DIAGNOSIS — I5031 Acute diastolic (congestive) heart failure: Secondary | ICD-10-CM | POA: Diagnosis not present

## 2023-05-24 DIAGNOSIS — I10 Essential (primary) hypertension: Secondary | ICD-10-CM

## 2023-05-24 DIAGNOSIS — R8290 Unspecified abnormal findings in urine: Secondary | ICD-10-CM | POA: Diagnosis not present

## 2023-05-24 DIAGNOSIS — N1832 Chronic kidney disease, stage 3b: Secondary | ICD-10-CM

## 2023-05-24 DIAGNOSIS — D631 Anemia in chronic kidney disease: Secondary | ICD-10-CM | POA: Diagnosis not present

## 2023-05-24 HISTORY — DX: Migraine, unspecified, not intractable, without status migrainosus: G43.909

## 2023-05-24 LAB — CBC
HCT: 30.7 % — ABNORMAL LOW (ref 36.0–46.0)
Hemoglobin: 9.6 g/dL — ABNORMAL LOW (ref 12.0–15.0)
MCH: 29.2 pg (ref 26.0–34.0)
MCHC: 31.3 g/dL (ref 30.0–36.0)
MCV: 93.3 fL (ref 80.0–100.0)
Platelets: 341 10*3/uL (ref 150–400)
RBC: 3.29 MIL/uL — ABNORMAL LOW (ref 3.87–5.11)
RDW: 15 % (ref 11.5–15.5)
WBC: 13.1 10*3/uL — ABNORMAL HIGH (ref 4.0–10.5)
nRBC: 0 % (ref 0.0–0.2)

## 2023-05-24 LAB — URINALYSIS, ROUTINE W REFLEX MICROSCOPIC
Bilirubin Urine: NEGATIVE
Glucose, UA: NEGATIVE mg/dL
Hgb urine dipstick: NEGATIVE
Ketones, ur: NEGATIVE mg/dL
Nitrite: NEGATIVE
Protein, ur: NEGATIVE mg/dL
Specific Gravity, Urine: 1.015 (ref 1.005–1.030)
pH: 5 (ref 5.0–8.0)

## 2023-05-24 LAB — TYPE AND SCREEN
ABO/RH(D): O POS
Antibody Screen: NEGATIVE

## 2023-05-24 LAB — BASIC METABOLIC PANEL
Anion gap: 12 (ref 5–15)
BUN: 51 mg/dL — ABNORMAL HIGH (ref 8–23)
CO2: 25 mmol/L (ref 22–32)
Calcium: 7.6 mg/dL — ABNORMAL LOW (ref 8.9–10.3)
Chloride: 98 mmol/L (ref 98–111)
Creatinine, Ser: 2.36 mg/dL — ABNORMAL HIGH (ref 0.44–1.00)
GFR, Estimated: 22 mL/min — ABNORMAL LOW (ref >60)
Glucose, Bld: 104 mg/dL — ABNORMAL HIGH (ref 70–99)
Potassium: 4.2 mmol/L (ref 3.5–5.1)
Sodium: 135 mmol/L (ref 135–145)

## 2023-05-24 LAB — SURGICAL PCR SCREEN
MRSA, PCR: NEGATIVE
Staphylococcus aureus: POSITIVE — AB

## 2023-05-24 NOTE — Patient Instructions (Signed)
Your procedure is scheduled on: Monday 06/06/23 To find out your arrival time, please call [phone] between 1PM - 3PM on:  Friday 06/03/23  Report to the Registration Desk on the 1st floor of the [facility]. Free Valet parking is available.  If your arrival time is 6:00 am, do not arrive before that time as the [facility] entrance doors do not open until 6:00 am.  REMEMBER: Instructions that are not followed completely may result in serious medical risk, up to and including death; or upon the discretion of your surgeon and anesthesiologist your surgery may need to be rescheduled.  Do not eat food after midnight the night before surgery.  No gum chewing or hard candies.  You may however, drink CLEAR liquids up to 2 hours before you are scheduled to arrive for your surgery. Do not drink anything within 2 hours of your scheduled arrival time.  Clear liquids include: - water  - apple juice without pulp - gatorade (not RED colors) - black coffee or tea (Do NOT add milk or creamers to the coffee or tea) Do NOT drink anything that is not on this list.  Type 1 and Type 2 diabetics should only drink water.  One week prior to surgery: Stop Anti-inflammatories (NSAIDS) such as Advil, Aleve, Ibuprofen, Motrin, Naproxen, Naprosyn and Aspirin based products such as Excedrin, Goody's Powder, BC Powder. You may however, continue to take Tylenol if needed for pain up until the day of surgery.  Stop ANY OVER THE COUNTER supplements 7 days before surgery   Continue taking all prescribed medications.  TAKE ONLY THESE MEDICATIONS THE MORNING OF SURGERY WITH A SIP OF WATER:  omeprazole (PRILOSEC) Antacid (take one the night before and one on the morning of surgery - helps to prevent nausea after surgery.) Pirfenidone  pramipexole (MIRAPEX)  rosuvastatin (CRESTOR)  Venlafaxine   No Alcohol for 24 hours before or after surgery.  No Smoking including e-cigarettes for 24 hours before surgery.   No chewable tobacco products for at least 6 hours before surgery.  No nicotine patches on the day of surgery.  Do not use any "recreational" drugs for at least a week (preferably 2 weeks) before your surgery.  Please be advised that the combination of cocaine and anesthesia may have negative outcomes, up to and including death. If you test positive for cocaine, your surgery will be cancelled.  On the morning of surgery brush your teeth with toothpaste and water, you may rinse your mouth with mouthwash if you wish. Do not swallow any toothpaste or mouthwash.  Use CHG Soap or wipes as directed on instruction sheet. Shower with CHG soap daily for 5 days starting on Thursday 06/02/23  Do not wear lotions, powders, or perfumes on the day of surgery..   Do not shave body hair from the neck down 48 hours before surgery.  Wear comfortable clothing (specific to your surgery type) to the hospital.  Do not wear jewelry, make-up, hairpins, clips or nail polish.  Contact lenses, hearing aids and dentures may not be worn into surgery.  Do not bring valuables to the hospital. [facility] is not responsible for any missing/lost belongings or valuables.   Notify your doctor if there is any change in your medical condition (cold, fever, infection).  If you are being discharged the day of surgery, you will not be allowed to drive home. You will need a responsible individual to drive you home and stay with you for 24 hours after surgery.  If you are taking public transportation, you will need to have a responsible individual with you.  If you are being admitted to the hospital overnight, leave your suitcase in the car. After surgery it may be brought to your room.  In case of increased patient census, it may be necessary for you, the patient, to continue your postoperative care in the Same Day Surgery department.  After surgery, you can help prevent lung complications by doing breathing exercises.   Take deep breaths and cough every 1-2 hours. Your doctor may order a device called an Incentive Spirometer to help you take deep breaths. When coughing or sneezing, hold a pillow firmly against your incision with both hands. This is called "splinting." Doing this helps protect your incision. It also decreases belly discomfort.  Surgery Visitation Policy:  Patients undergoing a surgery or procedure may have two family members or support persons with them as long as the person is not COVID-19 positive or experiencing its symptoms.   Inpatient Visitation:    Visiting hours are 7 a.m. to 8 p.m. Up to four visitors are allowed at one time in a patient room. The visitors may rotate out with other people during the day. One designated support person (adult) may remain overnight.  Please call the Pre-admissions Testing Dept. at [phone] if you have any questions about these instructions.    Pre-operative 5 CHG Bath Instructions   You can play a key role in reducing the risk of infection after surgery. Your skin needs to be as free of germs as possible. You can reduce the number of germs on your skin by washing with CHG (chlorhexidine gluconate) soap before surgery. CHG is an antiseptic soap that kills germs and continues to kill germs even after washing.   DO NOT use if you have an allergy to chlorhexidine/CHG or antibacterial soaps. If your skin becomes reddened or irritated, stop using the CHG and notify one of our RNs at [phone].   Please shower with the CHG soap starting 4 days before surgery using the following schedule:     Please keep in mind the following:  DO NOT shave, including legs and underarms, starting the day of your first shower.   You may shave your face at any point before/day of surgery.  Place clean sheets on your bed the day you start using CHG soap. Use a clean washcloth (not used since being washed) for each shower. DO NOT sleep with pets once you start  using the CHG.   CHG Shower Instructions:  If you choose to wash your hair and private area, wash first with your normal shampoo/soap.  After you use shampoo/soap, rinse your hair and body thoroughly to remove shampoo/soap residue.  Turn the water OFF and apply about 3 tablespoons (45 ml) of CHG soap to a CLEAN washcloth.  Apply CHG soap ONLY FROM YOUR NECK DOWN TO YOUR TOES (washing for 3-5 minutes)  DO NOT use CHG soap on face, private areas, open wounds, or sores.  Pay special attention to the area where your surgery is being performed.  If you are having back surgery, having someone wash your back for you may be helpful. Wait 2 minutes after CHG soap is applied, then you may rinse off the CHG soap.  Pat dry with a clean towel  Put on clean clothes/pajamas   If you choose to wear lotion, please use ONLY the CHG-compatible lotions on the back of this paper.     Additional  instructions for the day of surgery: DO NOT APPLY any lotions, deodorants, cologne, or perfumes.   Put on clean/comfortable clothes.  Brush your teeth.  Ask your nurse before applying any prescription medications to the skin.      CHG Compatible Lotions   Aveeno Moisturizing lotion  Cetaphil Moisturizing Cream  Cetaphil Moisturizing Lotion  Clairol Herbal Essence Moisturizing Lotion, Dry Skin  Clairol Herbal Essence Moisturizing Lotion, Extra Dry Skin  Clairol Herbal Essence Moisturizing Lotion, Normal Skin  Curel Age Defying Therapeutic Moisturizing Lotion with Alpha Hydroxy  Curel Extreme Care Body Lotion  Curel Soothing Hands Moisturizing Hand Lotion  Curel Therapeutic Moisturizing Cream, Fragrance-Free  Curel Therapeutic Moisturizing Lotion, Fragrance-Free  Curel Therapeutic Moisturizing Lotion, Original Formula  Eucerin Daily Replenishing Lotion  Eucerin Dry Skin Therapy Plus Alpha Hydroxy Crme  Eucerin Dry Skin Therapy Plus Alpha Hydroxy Lotion  Eucerin Original Crme  Eucerin Original Lotion   Eucerin Plus Crme Eucerin Plus Lotion  Eucerin TriLipid Replenishing Lotion  Keri Anti-Bacterial Hand Lotion  Keri Deep Conditioning Original Lotion Dry Skin Formula Softly Scented  Keri Deep Conditioning Original Lotion, Fragrance Free Sensitive Skin Formula  Keri Lotion Fast Absorbing Fragrance Free Sensitive Skin Formula  Keri Lotion Fast Absorbing Softly Scented Dry Skin Formula  Keri Original Lotion  Keri Skin Renewal Lotion Keri Silky Smooth Lotion  Keri Silky Smooth Sensitive Skin Lotion  Nivea Body Creamy Conditioning Oil  Nivea Body Extra Enriched Teacher, adult education Moisturizing Lotion Nivea Crme  Nivea Skin Firming Lotion  NutraDerm 30 Skin Lotion  NutraDerm Skin Lotion  NutraDerm Therapeutic Skin Cream  NutraDerm Therapeutic Skin Lotion  ProShield Protective Hand Cream  Provon moisturizing lotion

## 2023-05-25 LAB — URINE CULTURE: Culture: 30000 — AB

## 2023-05-26 ENCOUNTER — Telehealth: Payer: Self-pay | Admitting: Urgent Care

## 2023-05-26 DIAGNOSIS — Z01812 Encounter for preprocedural laboratory examination: Secondary | ICD-10-CM

## 2023-05-26 DIAGNOSIS — B951 Streptococcus, group B, as the cause of diseases classified elsewhere: Secondary | ICD-10-CM

## 2023-05-26 MED ORDER — AMOXICILLIN 500 MG PO TABS
500.0000 mg | ORAL_TABLET | Freq: Two times a day (BID) | ORAL | 0 refills | Status: DC
Start: 2023-05-26 — End: 2023-05-28

## 2023-05-26 NOTE — Progress Notes (Addendum)
[facility] Perioperative Services: Pre-Admission/Anesthesia Testing  Abnormal Lab Notification and Treatment Plan of Care   Date: 05/26/23  Name: Tonya Cummings MRN:   [id number]  Re: Abnormal labs noted during PAT appointment   Notified:  Provider Name Provider Role Notification Mode  Virgel Manifold, MD Neurosurgery Routed and/or faxed via [facility]   Abnormal Lab Value(s):   Lab Results  Component Value Date   COLORURINE YELLOW (A) 05/24/2023   APPEARANCEUR CLEAR (A) 05/24/2023   LABSPEC 1.015 05/24/2023   PHURINE 5.0 05/24/2023   GLUCOSEU NEGATIVE 05/24/2023   HGBUR NEGATIVE 05/24/2023   BILIRUBINUR NEGATIVE 05/24/2023   KETONESUR NEGATIVE 05/24/2023   PROTEINUR NEGATIVE 05/24/2023   NITRITE NEGATIVE 05/24/2023   LEUKOCYTESUR MODERATE (A) 05/24/2023   EPIU 0-5 05/24/2023   WBCU 21-50 05/24/2023   RBCU 0-5 05/24/2023   BACTERIA RARE (A) 05/24/2023   CULT (A) 05/24/2023    30,000 COLONIES/mL GROUP B STREP(S.AGALACTIAE)ISOLATED TESTING AGAINST S. AGALACTIAE NOT ROUTINELY PERFORMED DUE TO PREDICTABILITY OF AMP/PEN/VAN SUSCEPTIBILITY. Performed at [facility], [address], [place], [place] [address]    Clinical Information and Notes:  Patient is scheduled for C3-7 ANTERIOR CERVICAL DISCECTOMY AND FUSION ([facility]) on 06/06/2023.    UA performed in PAT consistent with/concerning for infection.  (+) leukocytosis noted on CBC; WBC 13.1 Renal function: Estimated Creatinine Clearance: 21.4 mL/min (A) (by C-G formula based on SCr of 2.36 mg/dL (H)). Urine C&S added to assess for pathogenically significant growth.  Impression and Plan:  Tonya Cummings with a UA that was (+) for signs of infection; (+) bacteria, 21-50 WBC/hpf, and moderate LE. Patient has (+) leukocytosis with a WBC count of 13.1 K/uL. Reflex culture sent that grew out GBS.   Contacted patient to discuss. Patient reporting that she is experiencing some minor  frequency/urgency. No dysuria, hematuria, nausea, vomiting, fevers, or abdomina/back pain. Patient with surgery scheduled soon. Discussed with attending surgeon. Given that sample was (+) for LE, and the fact that she has a elevated WBC count, MD recommending recommending to proceed with treatment.   In efforts to avoid delaying patient's procedure, or have her experience any potentially significant perioperative complications related to the aforementioned, I would like to proceed with empiric treatment for urinary tract infection.  Allergies reviewed. Culture report also reviewed to ensure culture appropriate coverage is being provided. Will treat with a 5 day course of amoxicillin. Patient has reduced renal function, therefore dose is being reduced per guidelines to 500 mg BID. Patient encouraged to complete the entire course of antibiotics even if she begins to feel better. She was advised that if culture demonstrates resistance to the prescribed antibiotic, she will be contacted and advised of the need to change the antibiotic being used to treat her infection.   Meds ordered this encounter  Medications   amoxicillin (AMOXIL) 500 MG tablet    Sig: Take 1 tablet (500 mg total) by mouth 2 (two) times daily for 5 days. Increase WATER intake while taking this medication.    Dispense:  10 tablet    Refill:  0   Patient encouraged to increase her fluid intake as much as possible. Discussed that water is always best to flush the urinary tract. She was advised to avoid caffeine containing fluids until her infections clears, as caffeine can cause her to experience painful bladder spasms.   May use Tylenol as needed for pain/fever should she experience these symptoms.   Patient instructed to call surgeon's office  or PAT with any questions or concerns related to the above outlined course of treatment. Additionally, she was instructed to call if she feels like she is getting worse overall while on  treatment. Results and treatment plan of care forwarded to primary attending surgeon to make them aware.   Encounter Diagnoses  Name Primary?   Pre-operative laboratory examination Yes   Group B streptococcal UTI    Quentin Mulling, MSN, APRN, FNP-C, CEN [facility]  Peri-operative Services Nurse Practitioner Phone: [phone] Fax: [phone] 05/26/23 11:27 AM  NOTE: This note has been prepared using Dragon dictation software. Despite my best ability to proofread, there is always the potential that unintentional transcriptional errors may still occur from this process.

## 2023-05-28 ENCOUNTER — Other Ambulatory Visit: Payer: Self-pay

## 2023-05-28 ENCOUNTER — Emergency Department
Admission: EM | Admit: 2023-05-28 | Discharge: 2023-05-28 | Disposition: A | Discharge status: Home / Self Care | Payer: Medicare HMO | Attending: Emergency Medicine | Admitting: Emergency Medicine

## 2023-05-28 ENCOUNTER — Ambulatory Visit (INDEPENDENT_AMBULATORY_CARE_PROVIDER_SITE_OTHER)
Admission: EM | Admit: 2023-05-28 | Discharge: 2023-05-28 | Disposition: A | Discharge status: Home / Self Care | Payer: Medicare HMO | Source: Home / Self Care

## 2023-05-28 ENCOUNTER — Ambulatory Visit (INDEPENDENT_AMBULATORY_CARE_PROVIDER_SITE_OTHER): Payer: Medicare HMO

## 2023-05-28 ENCOUNTER — Emergency Department: Payer: Medicare HMO

## 2023-05-28 ENCOUNTER — Ambulatory Visit: Payer: Medicare HMO

## 2023-05-28 DIAGNOSIS — E871 Hypo-osmolality and hyponatremia: Secondary | ICD-10-CM | POA: Insufficient documentation

## 2023-05-28 DIAGNOSIS — D72829 Elevated white blood cell count, unspecified: Secondary | ICD-10-CM | POA: Insufficient documentation

## 2023-05-28 DIAGNOSIS — D509 Iron deficiency anemia, unspecified: Secondary | ICD-10-CM | POA: Diagnosis not present

## 2023-05-28 DIAGNOSIS — R5383 Other fatigue: Secondary | ICD-10-CM | POA: Diagnosis present

## 2023-05-28 DIAGNOSIS — J189 Pneumonia, unspecified organism: Secondary | ICD-10-CM

## 2023-05-28 DIAGNOSIS — I130 Hypertensive heart and chronic kidney disease with heart failure and stage 1 through stage 4 chronic kidney disease, or unspecified chronic kidney disease: Secondary | ICD-10-CM | POA: Insufficient documentation

## 2023-05-28 DIAGNOSIS — I509 Heart failure, unspecified: Secondary | ICD-10-CM | POA: Insufficient documentation

## 2023-05-28 DIAGNOSIS — N289 Disorder of kidney and ureter, unspecified: Secondary | ICD-10-CM

## 2023-05-28 DIAGNOSIS — M25511 Pain in right shoulder: Secondary | ICD-10-CM

## 2023-05-28 DIAGNOSIS — N2889 Other specified disorders of kidney and ureter: Secondary | ICD-10-CM

## 2023-05-28 DIAGNOSIS — D631 Anemia in chronic kidney disease: Secondary | ICD-10-CM | POA: Insufficient documentation

## 2023-05-28 DIAGNOSIS — R0602 Shortness of breath: Secondary | ICD-10-CM | POA: Diagnosis not present

## 2023-05-28 DIAGNOSIS — N189 Chronic kidney disease, unspecified: Secondary | ICD-10-CM | POA: Insufficient documentation

## 2023-05-28 DIAGNOSIS — N184 Chronic kidney disease, stage 4 (severe): Secondary | ICD-10-CM | POA: Insufficient documentation

## 2023-05-28 DIAGNOSIS — N390 Urinary tract infection, site not specified: Secondary | ICD-10-CM

## 2023-05-28 DIAGNOSIS — M19011 Primary osteoarthritis, right shoulder: Secondary | ICD-10-CM | POA: Diagnosis not present

## 2023-05-28 DIAGNOSIS — R0902 Hypoxemia: Secondary | ICD-10-CM | POA: Diagnosis not present

## 2023-05-28 HISTORY — DX: Other specified disorders of kidney and ureter: N28.89

## 2023-05-28 LAB — URINALYSIS, ROUTINE W REFLEX MICROSCOPIC
Bilirubin Urine: NEGATIVE
Glucose, UA: NEGATIVE mg/dL
Hgb urine dipstick: NEGATIVE
Ketones, ur: NEGATIVE mg/dL
Nitrite: NEGATIVE
Protein, ur: NEGATIVE mg/dL
Specific Gravity, Urine: 1.015 (ref 1.005–1.030)
pH: 5 (ref 5.0–8.0)

## 2023-05-28 LAB — CBC WITH DIFFERENTIAL/PLATELET
Abs Immature Granulocytes: 0.08 10*3/uL — ABNORMAL HIGH (ref 0.00–0.07)
Basophils Absolute: 0.1 10*3/uL (ref 0.0–0.1)
Basophils Relative: 0 %
Eosinophils Absolute: 0 10*3/uL (ref 0.0–0.5)
Eosinophils Relative: 0 %
HCT: 28.9 % — ABNORMAL LOW (ref 36.0–46.0)
Hemoglobin: 9 g/dL — ABNORMAL LOW (ref 12.0–15.0)
Immature Granulocytes: 1 %
Lymphocytes Relative: 10 %
Lymphs Abs: 1.5 10*3/uL (ref 0.7–4.0)
MCH: 29.1 pg (ref 26.0–34.0)
MCHC: 31.1 g/dL (ref 30.0–36.0)
MCV: 93.5 fL (ref 80.0–100.0)
Monocytes Absolute: 1.4 10*3/uL — ABNORMAL HIGH (ref 0.1–1.0)
Monocytes Relative: 10 %
Neutro Abs: 11.3 10*3/uL — ABNORMAL HIGH (ref 1.7–7.7)
Neutrophils Relative %: 79 %
Platelets: 330 10*3/uL (ref 150–400)
RBC: 3.09 MIL/uL — ABNORMAL LOW (ref 3.87–5.11)
RDW: 15 % (ref 11.5–15.5)
WBC: 14.4 10*3/uL — ABNORMAL HIGH (ref 4.0–10.5)
nRBC: 0 % (ref 0.0–0.2)

## 2023-05-28 LAB — HEPATIC FUNCTION PANEL
ALT: 15 U/L (ref 0–44)
AST: 21 U/L (ref 15–41)
Albumin: 3.3 g/dL — ABNORMAL LOW (ref 3.5–5.0)
Alkaline Phosphatase: 85 U/L (ref 38–126)
Bilirubin, Direct: <0.1 mg/dL (ref 0.0–0.2)
Total Bilirubin: 0.5 mg/dL (ref 0.3–1.2)
Total Protein: 7.7 g/dL (ref 6.5–8.1)

## 2023-05-28 LAB — BASIC METABOLIC PANEL
Anion gap: 10 (ref 5–15)
Anion gap: 14 (ref 5–15)
BUN: 63 mg/dL — ABNORMAL HIGH (ref 8–23)
BUN: 67 mg/dL — ABNORMAL HIGH (ref 8–23)
CO2: 24 mmol/L (ref 22–32)
CO2: 23 mmol/L (ref 22–32)
Calcium: 7.1 mg/dL — ABNORMAL LOW (ref 8.9–10.3)
Calcium: 7.5 mg/dL — ABNORMAL LOW (ref 8.9–10.3)
Chloride: 94 mmol/L — ABNORMAL LOW (ref 98–111)
Chloride: 98 mmol/L (ref 98–111)
Creatinine, Ser: 2.69 mg/dL — ABNORMAL HIGH (ref 0.44–1.00)
Creatinine, Ser: 2.75 mg/dL — ABNORMAL HIGH (ref 0.44–1.00)
GFR, Estimated: 18 mL/min — ABNORMAL LOW (ref >60)
GFR, Estimated: 18 mL/min — ABNORMAL LOW (ref >60)
Glucose, Bld: 102 mg/dL — ABNORMAL HIGH (ref 70–99)
Glucose, Bld: 95 mg/dL (ref 70–99)
Potassium: 4.1 mmol/L (ref 3.5–5.1)
Potassium: 4.4 mmol/L (ref 3.5–5.1)
Sodium: 128 mmol/L — ABNORMAL LOW (ref 135–145)
Sodium: 134 mmol/L — ABNORMAL LOW (ref 135–145)

## 2023-05-28 LAB — CBC
HCT: 28.8 % — ABNORMAL LOW (ref 36.0–46.0)
Hemoglobin: 8.9 g/dL — ABNORMAL LOW (ref 12.0–15.0)
MCH: 29.1 pg (ref 26.0–34.0)
MCHC: 30.9 g/dL (ref 30.0–36.0)
MCV: 94.1 fL (ref 80.0–100.0)
Platelets: 346 10*3/uL (ref 150–400)
RBC: 3.06 MIL/uL — ABNORMAL LOW (ref 3.87–5.11)
RDW: 14.7 % (ref 11.5–15.5)
WBC: 14.6 10*3/uL — ABNORMAL HIGH (ref 4.0–10.5)
nRBC: 0 % (ref 0.0–0.2)

## 2023-05-28 MED ORDER — LEVOFLOXACIN 250 MG PO TABS: 250.0000 mg | ORAL_TABLET | Freq: Every day | ORAL | 0 refills | Status: AC

## 2023-05-28 MED ORDER — OXYCODONE HCL 5 MG PO TABS: 5.0000 mg | ORAL_TABLET | Freq: Four times a day (QID) | ORAL | 0 refills | Status: DC | PRN

## 2023-05-28 MED ORDER — OXYCODONE-ACETAMINOPHEN 5-325 MG PO TABS
1.0000 | ORAL_TABLET | Freq: Once | ORAL | Status: AC
  Administered 2023-05-28: 1 via ORAL
  Filled 2023-05-28: qty 1

## 2023-05-28 NOTE — Discharge Instructions (Addendum)
Please go to the emergency department at [facility] to be evaluated for your low sodium, elevated white blood cell count, and worsening renal function.  Please go now.

## 2023-05-28 NOTE — Discharge Instructions (Addendum)
Follow-up with Dr. Cherylann Ratel, ask him if he will be able to follow you for the renal mass, if not follow-up with Dr. Apolinar Junes Follow-up with your regular doctor as needed Discontinue the current antibiotic you are taking.  Take the Levaquin as prescribed.  This will cover your urinary tract infection along with suspected pneumonia seen on the CT Return emergency department if you are worsening Pain medication only as needed.  May also take Tylenol for pain if needed.  Avoid any NSAIDs

## 2023-05-28 NOTE — ED Provider Notes (Signed)
[facility]    CSN: [id number] Arrival date & time: 05/28/23  4098      History   Chief Complaint Chief Complaint  Patient presents with   Shoulder Pain    HPI Tonya Cummings is a 72 y.o. female.   HPI  72 year old female with a past medical history significant for CHF, kidney disease, GERD, hypertension, hyperlipidemia, and IDA presenting for evaluation of pain in her right shoulder.  She reports that she fell on her right shoulder approximately month and a half ago.  She was evaluated at [facility] clinic on 04/07/2023 and had an x-ray of her right shoulder that was ruled negative.  The results are unable to be reviewed in epic.  She reports that she has continued pain with limited range of motion.  She also has numbness and tingling in her fingers as well as complaining of weakness in her grip.  She is scheduled to have cervical spine surgery in 2 days at [facility].  The patient head is cocked to the right and she has limited range of motion of her neck secondary to a disc issue.  Additionally the patient is complaining of some mild shortness of breath and leg swelling.  She is currently being treated for UTI and reports that her urine output has decreased.  She denies fever or cough though she does report that her temperature was slightly elevated at 99.9 last night.  Past Medical History:  Diagnosis Date   Anxiety    CHF (congestive heart failure) (HCC)    Chronic kidney disease    Chronic pain    Chronic radicular pain of lower back    Costochondritis    Depression    Encounter for blood transfusion    GERD (gastroesophageal reflux disease)    Hip dysplasia    Hyperlipidemia    Hypertension    Iron deficiency anemia    Low back pain    Low vitamin B12 level    Migraines    Nose colonized with MRSA 10/03/2020   a.) preop PCR (+) 10/03/2020 prior to RIGHT TKA   Obesity    Osteoporosis    Pelvic fracture (HCC)    Raynaud's disease without gangrene    Restless  leg syndrome    Rheumatoid aortitis    Rheumatoid arthritis (HCC)    Seasonal allergies    Sleep disorder    Thoracic compression fracture [facility])     Patient Active Problem List   Diagnosis Date Noted   Anemia in stage 4 chronic kidney disease (HCC) 01/10/2023   Long-term use of immunosuppressant medication 03/09/2022   Dyspnea 09/29/2021   Acute CHF (congestive heart failure) (HCC) 09/29/2021   Acute hypoxemic respiratory failure (HCC) 09/29/2021   Total knee replacement status 10/13/2020   CKD (chronic kidney disease) stage 3, GFR 30-59 ml/min (HCC) 10/12/2020   Hyperlipidemia 10/12/2020   Hypertension 10/12/2020   Migraine headache 10/12/2020   Restless legs syndrome (RLS) 10/12/2020   Severe obesity (BMI 35.0-39.9) with comorbidity (HCC) 06/30/2020   Primary osteoarthritis of right knee 03/27/2020   Iron deficiency anemia 09/12/2019   Low vitamin B12 level 09/12/2019   Normocytic anemia 09/07/2019   Right medial knee pain 08/09/2018   Depression with anxiety 03/22/2018   Sleep disorder 03/22/2018   Influenza A 02/17/2018   Community acquired pneumonia 02/17/2018   Chronic radicular pain of lower back 02/06/2018   Numbness and tingling of right leg 02/06/2018   Hip dysplasia 02/23/2016   Osteoporosis, post-menopausal  02/23/2016   Raynaud's disease without gangrene 02/23/2016   Seropositive rheumatoid arthritis (HCC) 02/23/2016   Lumbar facet joint pain 09/18/2014    Past Surgical History:  Procedure Laterality Date   ABDOMINAL SURGERY     pt denies   APPENDECTOMY     CARPAL TUNNEL RELEASE Bilateral    CHOLECYSTECTOMY     COLONOSCOPY WITH PROPOFOL N/A 08/15/2020   Procedure: COLONOSCOPY WITH PROPOFOL;  Surgeon: Earline Mayotte, MD;  Location: [facility] ENDOSCOPY;  Service: Endoscopy;  Laterality: N/A;   DILATION AND CURETTAGE OF UTERUS     ESOPHAGOGASTRODUODENOSCOPY (EGD) WITH PROPOFOL N/A 08/15/2020   Procedure: ESOPHAGOGASTRODUODENOSCOPY (EGD) WITH PROPOFOL;  Surgeon:  Earline Mayotte, MD;  Location: [facility] ENDOSCOPY;  Service: Endoscopy;  Laterality: N/A;   FRACTURE SURGERY     hip fracture    FRACTURE SURGERY     pelvic fracture plate    HIP SURGERY Left    KNEE ARTHROPLASTY Right 10/13/2020   Procedure: COMPUTER ASSISTED TOTAL KNEE ARTHROPLASTY - RNFA;  Surgeon: Donato Heinz, MD;  Location: [facility] ORS;  Service: Orthopedics;  Laterality: Right;   TUBAL LIGATION      OB History   No obstetric history on file.      Home Medications    Prior to Admission medications   Medication Sig Start Date End Date Taking? Authorizing Provider  oxyCODONE-acetaminophen (PERCOCET) 7.5-325 MG tablet Take 1 tablet by mouth every 8 (eight) hours as needed. 04/07/23  Yes [provider]  Acetaminophen-Caffeine (EXCEDRIN TENSION HEADACHE) 500-65 MG TABS Take 1 tablet by mouth daily as needed (Headache).    [provider]  amoxicillin (AMOXIL) 500 MG tablet Take 1 tablet (500 mg total) by mouth 2 (two) times daily for 5 days. Increase WATER intake while taking this medication. 05/26/23 05/31/23  Verlee Monte, NP  calcitRIOL (ROCALTROL) 0.25 MCG capsule Take 0.25 mcg by mouth daily. 05/22/21   [provider]  Calcium Carb-Cholecalciferol 600-400 MG-UNIT CAPS Take 1 capsule by mouth 3 (three) times daily. 01/13/10   [provider]  Cholecalciferol 50 MCG (2000 UT) CAPS Take 2,000 Units by mouth daily. 09/23/09   [provider]  cyclobenzaprine (FLEXERIL) 10 MG tablet Take 10 mg by mouth at bedtime.    [provider]  enalapril-hydrochlorothiazide (VASERETIC) 10-25 MG tablet Take 1 tablet by mouth daily.    [provider]  ferrous sulfate 325 (65 FE) MG tablet Take 325 mg by mouth daily with breakfast.    [provider]  furosemide (LASIX) 40 MG tablet Take 60 mg by mouth daily. 10/08/21   [provider]  hydroxychloroquine (PLAQUENIL) 200 MG tablet Take 1 tablet by mouth 2 (two) times  daily. 06/22/21   [provider]  mirtazapine (REMERON) 30 MG tablet Take 1 tablet by mouth at bedtime. 06/29/22   [provider]  Multiple Vitamins-Minerals (MULTIVITAMIN ADULT PO) Take 1 tablet by mouth daily.  09/10/08   [provider]  omeprazole (PRILOSEC) 40 MG capsule Take 40 mg by mouth daily.    [provider]  Pirfenidone 267 MG TABS Take 3 tablets by mouth 3 (three) times daily. 12/24/22   [provider]  pramipexole (MIRAPEX) 0.125 MG tablet Take 0.125 mg by mouth daily. Take 0.125 mg in the morning and 2 at bedtime 02/15/18   [provider]  PROLIA 60 MG/ML SOSY injection Inject 60 mg into the skin every 6 (six) months. 04/06/23   [provider]  rosuvastatin (CRESTOR)  5 MG tablet Take 5 mg by mouth every other day.  07/18/19   [provider]  spironolactone (ALDACTONE) 25 MG tablet Take 25 mg by mouth once. 04/09/21   [provider]  Venlafaxine HCl 75 MG TB24 Take 1 tablet by mouth daily. Take daily with 150 mg for total 225 mg daily    [provider]  venlafaxine XR (EFFEXOR-XR) 150 MG 24 hr capsule Take 150 mg by mouth daily with breakfast. Take with 75 mg for total 225 mg daily 12/28/22 12/28/23  [provider]  vitamin B-12 (CYANOCOBALAMIN) 500 MCG tablet Take 500 mcg by mouth daily.    [provider]    Family History Family History  Problem Relation Age of Onset   Diabetes Mother    Hypertension Mother    Aneurysm Father    Diabetes Son    Seizures Son    Osteosarcoma Brother    Cancer Brother    Diabetes Brother    Diabetes Maternal Grandfather    Heart disease Maternal Grandfather     Social History Social History   Tobacco Use   Smoking status: Never   Smokeless tobacco: Never  Vaping Use   Vaping Use: Never used  Substance Use Topics   Alcohol use: No   Drug use: No     Allergies   Gabapentin and Ibuprofen   Review of Systems Review of  Systems  Constitutional:  Negative for fever.  Respiratory:  Positive for shortness of breath. Negative for cough.   Cardiovascular:  Positive for leg swelling. Negative for chest pain.  Musculoskeletal:  Positive for arthralgias.       Pain in right shoulder with limited range of motion.     Physical Exam Triage Vital Signs ED Triage Vitals  Enc Vitals Group     BP --      Pulse --      Resp --      Temp --      Temp src --      SpO2 --      Weight 05/28/23 0857 164 lb (74.4 kg)     Height 05/28/23 0857 5\' 3"  (1.6 m)     Head Circumference --      Peak Flow --      Pain Score 05/28/23 0856 7     Pain Loc --      Pain Edu? --      Excl. in GC? --    No data found.  Updated Vital Signs BP 112/71 (BP Location: Left Arm)   Pulse 83   Temp 98.9 F (37.2 C) (Oral)   Resp 20   Ht 5\' 3"  (1.6 m)   Wt 164 lb (74.4 kg)   SpO2 92%   BMI 29.05 kg/m   Visual Acuity Right Eye Distance:   Left Eye Distance:   Bilateral Distance:    Right Eye Near:   Left Eye Near:    Bilateral Near:     Physical Exam Vitals and nursing note reviewed.  Constitutional:      Appearance: Normal appearance. She is not ill-appearing.  HENT:     Head: Normocephalic and atraumatic.  Cardiovascular:     Rate and Rhythm: Normal rate and regular rhythm.     Pulses: Normal pulses.     Heart sounds: Normal heart sounds. No murmur heard.    No friction rub. No gallop.  Pulmonary:     Effort: Pulmonary effort is normal. No respiratory  distress.     Breath sounds: Rales present. No wheezing or rhonchi.     Comments: Fine crackles in bilateral bases. Musculoskeletal:     Right lower leg: Edema present.     Left lower leg: Edema present.  Skin:    General: Skin is warm and dry.     Capillary Refill: Capillary refill takes less than 2 seconds.     Coloration: Skin is pale.  Neurological:     General: No focal deficit present.     Mental Status: She is alert and oriented to person, place, and  time.      UC Treatments / Results  Labs (all labs ordered are listed, but only abnormal results are displayed) Labs Reviewed  CBC WITH DIFFERENTIAL/PLATELET - Abnormal; Notable for the following components:      Result Value   WBC 14.4 (*)    RBC 3.09 (*)    Hemoglobin 9.0 (*)    HCT 28.9 (*)    Neutro Abs 11.3 (*)    Monocytes Absolute 1.4 (*)    Abs Immature Granulocytes 0.08 (*)    All other components within normal limits  BASIC METABOLIC PANEL - Abnormal; Notable for the following components:   Sodium 128 (*)    Chloride 94 (*)    Glucose, Bld 102 (*)    BUN 63 (*)    Creatinine, Ser 2.69 (*)    Calcium 7.1 (*)    GFR, Estimated 18 (*)    All other components within normal limits    EKG Normal sinus rhythm with sinus arrhythmia and ventricular rate of 78 bpm PR interval 170 ms QRS duration 88 ms QT/QTc 370/421 ms Nonspecific T wave abnormality.  Radiology DG Chest 2 View  Result Date: 05/28/2023 CLINICAL DATA:  Low oxygen saturation and history of CHF. EXAM: CHEST - 2 VIEW COMPARISON:  October 12, 2021 FINDINGS: The cardiomediastinal silhouette is unchanged in contour.Similar reticular markings at the RIGHT lung base in comparison to prior consistent with scar. No pleural effusion. No pneumothorax. No acute pleuroparenchymal abnormality. Visualized abdomen is unremarkable. Multiple remote RIGHT-sided rib fractures. Similar wedging of a vertebral body at the thoracolumbar junction. Question increased wedging of a vertebral body at the upper lumbar spine. Multilevel degenerative changes of the thoracic spine. IMPRESSION: 1.  No acute cardiopulmonary abnormality. 2. Question increased wedging of a vertebral body at the upper lumbar spine. Recommend correlation with point tenderness. Electronically Signed   By: Meda Klinefelter M.D.   On: 05/28/2023 09:52   DG Shoulder Right  Result Date: 05/28/2023 CLINICAL DATA:  pain EXAM: RIGHT SHOULDER - 2+ VIEW COMPARISON:  CT  dated April 02, 2022. FINDINGS: Osteopenia. No acute fracture or dislocation. Moderate degenerative changes of the acromioclavicular joint with osteophyte formation. Favored subacromial interval narrowing, suboptimally assessed by positioning. Mild degenerative changes of the glenohumeral joint. Multiple remote RIGHT-sided rib fractures at the RIGHT posterior fourth, posterior fifth and lateral seventh ribs. Age indeterminate rib fractures of the RIGHT anterolateral sixth and seventh ribs, favored remote. No area of erosion or osseous destruction. No unexpected radiopaque foreign body. Soft tissues are unremarkable. IMPRESSION: 1. No acute fracture or dislocation. 2. Narrowing of the subacromial interval may reflect underlying rotator cuff injury. 3. Multiple favored remote RIGHT-sided rib fractures. Electronically Signed   By: Meda Klinefelter M.D.   On: 05/28/2023 09:49    Procedures Procedures (including critical care time)  Medications Ordered in UC Medications - No data to display  Initial Impression /  Assessment and Plan / UC Course  I have reviewed the triage vital signs and the nursing notes.  Pertinent labs & imaging results that were available during my care of the patient were reviewed by me and considered in my medical decision making (see chart for details).   The patient is a pleasant 72 year old female with significant past medical history presenting for evaluation of pain in her right shoulder that is been going on for last month and a half as outlined HPI above.  At triage she was found to have an oxygen saturation of 92% with a respiratory of 20.  She is afebrile at 98.9.  She does complain of feeling mildly short of breath, though she is able to speak in full sentences, and denies cough.  She does endorse leg swelling and on exam she does have +1 pitting edema on the right with +2 pitting edema on the left.  She reports her left leg always swells more than her right.  Her  extremities are warm and her DP PT pulses are 2+ bilaterally.  Patient does have a pale appearance.  When performing her cardiopulmonary exam she does have S1-S2 heart sounds with regular rate and rhythm and fine crackles in bilateral bases.  With regards to her right arm she has 5/5 grip strength when compared to the left.  Radial and ulnar pulses are 2+.  She does have some mild tenderness with palpation of her right elbow as well as her right humerus.  No crepitus appreciated.  Also pain with palpation of the anterior portion of the glenohumeral joint.  The patient has limited range of motion with approximately 60 degrees of abduction, 120 degrees of external rotation, and limited internal rotation.  The radiographs performed at [facility] clinic on 04/07/2023 are not available in epic, neither is the radiology over read.  Therefore, I will reorder x-rays of the right shoulder to look for any bony abnormality.  I will also do chest x-ray to evaluate for the presence of fluid overload along with a CBC and CMP.  She is currently being treated with antibiotics for UTI and reports that her urine production has decreased.  Concern for possible AKI secondary to antibiotics.  I will also do an EKG the patient denies chest pain.  It is unclear whether her shoulder pain is secondary to her cervical issues or if it is related to the previous trauma.  With the limited range of motion patient may also be developing frozen shoulder given her pain.  Chest x-ray independently reviewed and evaluated by me.  Impression: Patient does have an enlarged heart.  Lung markings are prominent and there is some mild fluffiness in bilateral bases.  Questionable effusion in right lung base near heart border.  Radiology overread is pending. Radiology impression states no acute cardiopulmonary abnormality.  There is questionable increased wedging of the vertebral body of the upper lumbar spine.  Right shoulder x-rays independently reviewed  and evaluated by me.  Impression: No evidence of fracture or dislocation.  Mild degenerative changes of the glenohumeral head and acromion process.  Radiology overread is pending. Radiology impression states no acute fracture or dislocation.  Narrowing of the subacromial interval may reflect underlying rotator cuff injury.  Multiple favor remote right-sided rib fractures.  CMP shows hyponatremia with a sodium of 128, hypochloremia with chloride of 94.  Potassium is normal at 4.1.  Patient's BUN is 63 and creatinine is 2.69 which is an interval increase over 4 days ago.  CBC shows an interval increase white blood cell count of 14.4, which is up from 4 days ago at 13.1.  Patient's H&H is 9.0 and 28.9 which is an interval drop from 9.6 and 30.74 days ago.  Elevated neutrophil number, elevated monocyte number, and increased absolute immature granulocytes.  EKG shows normal sinus rhythm with sinus arrhythmia and nonspecific T wave abnormalities.  This is largely unchanged from her EKG on 05/10/2023.  Patient's shoulder pain may be related to rotator cuff injury as suggested by plain films.  Of bigger concern is her hyponatremia and worsening renal function which may be secondary to her UTI.  I will therefore refer her to the emergency department for sodium replacement as well as possible IV antibiotics for her UTI.  I have called and given report to Morrie Sheldon, the charge nurse in the ER at [facility].   Final Clinical Impressions(s) / UC Diagnoses   Final diagnoses:  Hyponatremia  Leukocytosis, unspecified type  Worsening renal function     Discharge Instructions      Please go to the emergency department at [facility] to be evaluated for your low sodium, elevated white blood cell count, and worsening renal function.  Please go now.     ED Prescriptions   None    PDMP not reviewed this encounter.   Becky Augusta, NP 05/28/23 1025

## 2023-05-28 NOTE — ED Triage Notes (Addendum)
Patient states she was sent over from [facility] for elevated BUN and Creatinine and decreased Na. Patient reports decreased urine output and feeling weak.

## 2023-05-28 NOTE — ED Provider Notes (Signed)
[facility] Provider Note    Event Date/Time   First MD Initiated Contact with Patient 05/28/23 1247     (approximate)   History   Abnormal Lab   HPI  Tonya Cummings is a 72 y.o. female with history of hypertension, rheumatoid arthritis, CKD, CHF, and hyperlipidemia presents emergency department with weakness and fatigue.  Sent from the urgent care as her sodium was low at urgent care.  Patient states she may have a UTI.  Did have fever yesterday.  Some shortness of breath yesterday but has improved today.  Denies chest pain      Physical Exam   Triage Vital Signs: ED Triage Vitals  Enc Vitals Group     BP 05/28/23 1136 124/63     Pulse Rate 05/28/23 1136 93     Resp 05/28/23 1136 18     Temp 05/28/23 1136 98.3 F (36.8 C)     Temp Source 05/28/23 1136 Oral     SpO2 05/28/23 1136 95 %     Weight 05/28/23 1136 164 lb (74.4 kg)     Height 05/28/23 1136 5\' 3"  (1.6 m)     Head Circumference --      Peak Flow --      Pain Score 05/28/23 1140 8     Pain Loc --      Pain Edu? --      Excl. in GC? --     Most recent vital signs: Vitals:   05/28/23 1136  BP: 124/63  Pulse: 93  Resp: 18  Temp: 98.3 F (36.8 C)  SpO2: 95%     General: Awake, no distress.   CV:  Good peripheral perfusion. regular rate and  rhythm Resp:  Normal effort. Lungs cta Abd:  No distention.  Mildly tender in the epigastric and right flank Other:      ED Results / Procedures / Treatments   Labs (all labs ordered are listed, but only abnormal results are displayed) Labs Reviewed  BASIC METABOLIC PANEL - Abnormal; Notable for the following components:      Result Value   Sodium 134 (*)    BUN 67 (*)    Creatinine, Ser 2.75 (*)    Calcium 7.5 (*)    GFR, Estimated 18 (*)    All other components within normal limits  CBC - Abnormal; Notable for the following components:   WBC 14.6 (*)    RBC 3.06 (*)    Hemoglobin 8.9 (*)    HCT 28.8 (*)    All other  components within normal limits  URINALYSIS, ROUTINE W REFLEX MICROSCOPIC - Abnormal; Notable for the following components:   Color, Urine YELLOW (*)    APPearance HAZY (*)    Leukocytes,Ua MODERATE (*)    Bacteria, UA RARE (*)    All other components within normal limits  HEPATIC FUNCTION PANEL - Abnormal; Notable for the following components:   Albumin 3.3 (*)    All other components within normal limits     EKG  EKG   RADIOLOGY CT renal stone Chest x-ray    PROCEDURES:   Procedures   MEDICATIONS ORDERED IN ED: Medications  oxyCODONE-acetaminophen (PERCOCET/ROXICET) 5-325 MG per tablet 1 tablet (1 tablet Oral Given 05/28/23 1424)     IMPRESSION / MDM / ASSESSMENT AND PLAN / ED COURSE  I reviewed the triage vital signs and the nursing notes.  Differential diagnosis includes, but is not limited to, weakness, electrolyte imbalance, pyelonephritis, UTI, sepsis  Patient's presentation is most consistent with acute presentation with potential threat to life or bodily function.   In review of the patient's labs from the urgent care sodium was 128 while here in the ED her sodium has increased up to 134.  Kidney functions appear to be in her normal trend with a GFR of 18  CBC does have elevated WBC of 14.6 along with a moderate amount of leuks in the urine  Due to the patient's kidney functions along with abdominal pain we will only do a CT renal stone.  Patient cannot tolerate contrast with a GFR of less than 18  I did independently review and interpret the radiologist reading for the CT renal stone study as having possible pneumonia in the lower lung fields along with right renal mass measuring 5.7 cm  I did explain all of these findings to the patient.  Since she was unaware of the renal mass I will have her follow-up with urology and nephrology.  She does already see Dr. Cherylann Ratel for her decreased kidney function.  I told her to call him  prior to calling Dr. Delana Meyer office as he may be able to handle the renal mass.  If not she should call Dr. Delana Meyer office.  She was given a prescription of Levaquin 250 mg due to her decreased kidney functions will not give a higher dose.  She is to follow-up with her regular doctor.  Return emergency department worsening.  She is to stop the current antibiotic she has been taking.  She is in agreement treatment plan.  I did offer admission but patient would like to go home.  She does appear to be stable and labs have remained stable so feel that she can be discharged.  However she is given strict instructions to return if worsening.      FINAL CLINICAL IMPRESSION(S) / ED DIAGNOSES   Final diagnoses:  Community acquired pneumonia, unspecified laterality  Acute UTI  Renal mass     Rx / DC Orders   ED Discharge Orders          Ordered    oxyCODONE (ROXICODONE) 5 MG immediate release tablet  Every 6 hours PRN        05/28/23 1413    levofloxacin (LEVAQUIN) 250 MG tablet  Daily        05/28/23 1413             Note:  This document was prepared using Dragon voice recognition software and may include unintentional dictation errors.    Faythe Ghee, PA-C 05/28/23 1430    Corena Herter, MD 05/28/23 [phone]

## 2023-05-28 NOTE — ED Triage Notes (Addendum)
Pt states she fell over a month ago onto right side and had right shoulder xray done at that time and was negative.  Limited ROM. Has hurt since then is now worse. Having neck surgery on [facility] on Monday

## 2023-05-30 ENCOUNTER — Encounter: Payer: Self-pay | Admitting: Neurosurgery

## 2023-06-01 DIAGNOSIS — M4312 Spondylolisthesis, cervical region: Secondary | ICD-10-CM | POA: Diagnosis not present

## 2023-06-02 ENCOUNTER — Encounter: Payer: Self-pay | Admitting: Urology

## 2023-06-02 ENCOUNTER — Ambulatory Visit: Payer: Medicare HMO | Admitting: Urology

## 2023-06-02 VITALS — BP 110/61 | HR 96 | Ht 63.0 in | Wt 165.0 lb

## 2023-06-02 DIAGNOSIS — N2889 Other specified disorders of kidney and ureter: Secondary | ICD-10-CM | POA: Diagnosis not present

## 2023-06-02 NOTE — Progress Notes (Signed)
Marcelle Overlie Plume,acting as a scribe for Riki Altes, MD.,have documented all relevant documentation on the behalf of Riki Altes, MD,as directed by  Riki Altes, MD while in the presence of Riki Altes, MD.  06/02/2023 4:21 PM   Tonya Cummings [birth year] [id number]  Referring provider: Myrene Buddy, NP [address] [place],  [place] [address]  Chief Complaint  Patient presents with   New Patient (Initial Visit)    HPI: Tonya Cummings is a 72 y.o. female who is referred for evaluation of a right renal mass.  Seen in Urgent Care 05/28/2023 for shoulder pain, lower abdominal pain, and was found to have low sodium and was referred to the ED. As part of that evaluation, a contrast CT scan of the abdomen pelvis was performed, which was remarkable for a 5.7 x 4.2 cm right renal mass.  She has been treated for a UTI but denies gross hematuria or dysuria. No prior abdominal imaging available for comparison Baseline creatinine in the last year varies between 2.36-2.92. History of CHF, rheumatoid arthritis, obstructive sleep apnea, and pulmonary fibrosis   PMH: Past Medical History:  Diagnosis Date   Anxiety    CHF (congestive heart failure) (HCC)    Chronic kidney disease    Chronic pain    Chronic radicular pain of lower back    Costochondritis    Depression    Encounter for blood transfusion    GERD (gastroesophageal reflux disease)    Hip dysplasia    Hyperlipidemia    Hypertension    Iron deficiency anemia    Low back pain    Low vitamin B12 level    Migraines    Nose colonized with MRSA 10/03/2020   a.) preop PCR (+) 10/03/2020 prior to RIGHT TKA   Obesity    Osteoporosis    Pelvic fracture (HCC)    Raynaud's disease without gangrene    Restless leg syndrome    Rheumatoid aortitis    Rheumatoid arthritis (HCC)    Seasonal allergies    Sleep disorder    Thoracic compression fracture [facility])     Surgical History: Past Surgical  History:  Procedure Laterality Date   ABDOMINAL SURGERY     pt denies   APPENDECTOMY     CARPAL TUNNEL RELEASE Bilateral    CHOLECYSTECTOMY     COLONOSCOPY WITH PROPOFOL N/A 08/15/2020   Procedure: COLONOSCOPY WITH PROPOFOL;  Surgeon: Earline Mayotte, MD;  Location: [facility] ENDOSCOPY;  Service: Endoscopy;  Laterality: N/A;   DILATION AND CURETTAGE OF UTERUS     ESOPHAGOGASTRODUODENOSCOPY (EGD) WITH PROPOFOL N/A 08/15/2020   Procedure: ESOPHAGOGASTRODUODENOSCOPY (EGD) WITH PROPOFOL;  Surgeon: Earline Mayotte, MD;  Location: [facility] ENDOSCOPY;  Service: Endoscopy;  Laterality: N/A;   FRACTURE SURGERY     hip fracture    FRACTURE SURGERY     pelvic fracture plate    HIP SURGERY Left    KNEE ARTHROPLASTY Right 10/13/2020   Procedure: COMPUTER ASSISTED TOTAL KNEE ARTHROPLASTY - RNFA;  Surgeon: Donato Heinz, MD;  Location: [facility] ORS;  Service: Orthopedics;  Laterality: Right;   TUBAL LIGATION      Home Medications:  Allergies as of 06/02/2023       Reactions   Gabapentin Other (See Comments)   Weight gain   Ibuprofen Other (See Comments)   Headache        Medication List        Accurate as of June 02, 2023  4:21 PM. If you have any questions, ask your nurse or doctor.          calcitRIOL 0.25 MCG capsule Commonly known as: ROCALTROL Take 0.25 mcg by mouth daily.   Calcium Carb-Cholecalciferol 600-400 MG-UNIT Caps Take 1 capsule by mouth 3 (three) times daily.   Cholecalciferol 50 MCG (2000 UT) Caps Take 2,000 Units by mouth daily.   cyanocobalamin 500 MCG tablet Commonly known as: VITAMIN B12 Take 500 mcg by mouth daily.   cyclobenzaprine 10 MG tablet Commonly known as: FLEXERIL Take 10 mg by mouth at bedtime.   enalapril-hydrochlorothiazide 10-25 MG tablet Commonly known as: VASERETIC Take 1 tablet by mouth daily.   Excedrin Tension Headache 500-65 MG Tabs per tablet Generic drug: acetaminophen-caffeine Take 1 tablet by mouth daily as needed (Headache).    ferrous sulfate 325 (65 FE) MG tablet Take 325 mg by mouth daily with breakfast.   furosemide 40 MG tablet Commonly known as: LASIX Take 60 mg by mouth daily.   hydroxychloroquine 200 MG tablet Commonly known as: PLAQUENIL Take 1 tablet by mouth 2 (two) times daily.   levofloxacin 250 MG tablet Commonly known as: Levaquin Take 1 tablet (250 mg total) by mouth daily for 7 days.   mirtazapine 30 MG tablet Commonly known as: REMERON Take 1 tablet by mouth at bedtime.   MULTIVITAMIN ADULT PO Take 1 tablet by mouth daily.   omeprazole 40 MG capsule Commonly known as: PRILOSEC Take 40 mg by mouth daily.   oxyCODONE 5 MG immediate release tablet Commonly known as: Roxicodone Take 1 tablet (5 mg total) by mouth every 6 (six) hours as needed for up to 15 doses.   Pirfenidone 267 MG Tabs Take 3 tablets by mouth 3 (three) times daily.   pramipexole 0.125 MG tablet Commonly known as: MIRAPEX Take 0.125 mg by mouth daily. Take 0.125 mg in the morning and 2 at bedtime   Prolia 60 MG/ML Sosy injection Generic drug: denosumab Inject 60 mg into the skin every 6 (six) months.   rosuvastatin 5 MG tablet Commonly known as: CRESTOR Take 5 mg by mouth every other day.   spironolactone 25 MG tablet Commonly known as: ALDACTONE Take 25 mg by mouth once.   Venlafaxine HCl 75 MG Tb24 Take 1 tablet by mouth daily. Take daily with 150 mg for total 225 mg daily   venlafaxine XR 150 MG 24 hr capsule Commonly known as: EFFEXOR-XR Take 150 mg by mouth daily with breakfast. Take with 75 mg for total 225 mg daily        Allergies:  Allergies  Allergen Reactions   Gabapentin Other (See Comments)    Weight gain   Ibuprofen Other (See Comments)    Headache    Family History: Family History  Problem Relation Age of Onset   Diabetes Mother    Hypertension Mother    Aneurysm Father    Diabetes Son    Seizures Son    Osteosarcoma Brother    Cancer Brother    Diabetes Brother     Diabetes Maternal Grandfather    Heart disease Maternal Grandfather     Social History:  reports that she has never smoked. She has never used smokeless tobacco. She reports that she does not drink alcohol and does not use drugs.   Physical Exam: BP 110/61   Pulse 96   Ht 5\' 3"  (1.6 m)   Wt 165 lb (74.8 kg)   BMI 29.23 kg/m  Constitutional:  Alert and oriented, No acute distress. HEENT: [place] AT Respiratory: Normal respiratory effort, no increased work of breathing. Psychiatric: Normal mood and affect.  Pertinent Imaging:  CT abdomen pelvis was reviewed, and there is a large central right renal mass and a smaller exophytic lesion off the right lower pole.  CT Renal Stone Study  Narrative CLINICAL DATA:  Abdominal pain, flank pain  EXAM: CT ABDOMEN AND PELVIS WITHOUT CONTRAST  TECHNIQUE: Multidetector CT imaging of the abdomen and pelvis was performed following the standard protocol without IV contrast.  RADIATION DOSE REDUCTION: This exam was performed according to the departmental dose-optimization program which includes automated exposure control, adjustment of the mA and/or kV according to patient size and/or use of iterative reconstruction technique.  COMPARISON:  None Available.  FINDINGS: Lower chest: There is focal alveolar infiltrate in the posteromedial left lower lung field. There are scattered foci of patchy ground-glass densities in both lower lung fields. There is mild ectasia of bronchi in the lower lung fields. There are a few noncalcified nodules in both lower lung fields measuring up to 8 mm in size. Similar finding was seen in previous CT chest done on 04/02/2022. Heart is enlarged in size. Coronary artery calcifications are seen.  Hepatobiliary: No focal abnormalities are seen in liver. There is no dilation of bile ducts. Gallbladder is not seen.  Pancreas: Atrophy.  No focal abnormalities are seen.  Spleen:  Unremarkable.  Adrenals/Urinary Tract: Adrenals are unremarkable. There is no hydronephrosis. There is 5.7 x 4.2 cm mass lesion in the medial lower portion of right kidney. There are no renal or ureteral stones. There is 1.2 cm exophytic lesion in the lower pole of right kidney, possibly a cyst. Ureters are not dilated. Beam hardening artifacts limit evaluation of bladder.  Stomach/Bowel: Moderate to large fixed hiatal hernia is seen. Intra-abdominal portion of stomach is not distended. Small bowel loops are unremarkable. Appendix is not distinctly seen. There is no pericecal inflammation. Moderate to large amount of stool is seen in colon, more so in the right colon. There is no wall thickening in colon. There is no pericolic stranding.  Vascular/Lymphatic: Scattered arterial calcifications are seen.  Reproductive: Unremarkable.  Other: There is no ascites or pneumoperitoneum. Umbilical hernia containing fat is seen.  Musculoskeletal: Metallic densities noted in the pubic bones from previous internal fixation. There are surgical screws transfixing the SI joints. There is 60 % decrease in height of body of L1 vertebra. There is 30-40% decrease in height of body of T11 vertebra. There is no demonstrable fracture line. This finding has not changed in comparison with CT lumbar spine done on 03/16/2018. Degenerative changes are noted in lumbar spine with spinal stenosis and encroachment of neural foramina at multiple levels.  IMPRESSION: There is 5.7 cm mass in the medial aspect of right kidney. Evaluation is limited without intravenous contrast. Possibility of malignant neoplasm in right kidney is not excluded. Follow-up contrast enhanced CT, PET-CT and tissue sampling as warranted should be considered. There is no hydronephrosis. Possible 1.2 cm cyst in the lower pole of right kidney.  There is focal alveolar infiltrate in the posteromedial left lower lung fields suggesting  pneumonia in left lower lobe. There are scattered patchy faint ground-glass densities in the lower lung fields suggesting scarring or interstitial pneumonia.  There are multiple noncalcified nodules in both lower lung fields measuring up to 8 mm in size. Similar findings were seen in previous CT chest suggesting possible granulomas.  There is no  evidence of intestinal obstruction or pneumoperitoneum. Moderate to large fixed hiatal hernia.  There is decrease in height of bodies of T11 and L1 vertebrae with no significant interval change suggesting old compression fractures. Degenerative changes are noted in lumbar spine.  Arteriosclerosis.  Coronary artery disease.  Other findings as described in the body of the report.   Electronically Signed By: Ernie Avena M.D. On: 05/28/2023 13:52   Assessment & Plan:    1. Right renal mass Not a candidate for iodinated contrast secondary to chronic kidney disease Will see if GFR will allow a renal mass protocol MRI for further evaluation We discussed the possibility of renal cell carcinoma. Based on mass size and location, unlikely to be candidate for percutaneous ablation. Based on chronic kidney disease, a nephrectomy would increase the chance of needing dialysis. Once additional imaging is performed, she will return for further management recommendations.  I have reviewed the above documentation for accuracy and completeness, and I agree with the above.   Riki Altes, MD  [facility] [address], [address] [place], [place] [address] [phone]

## 2023-06-06 DIAGNOSIS — R350 Frequency of micturition: Secondary | ICD-10-CM

## 2023-06-06 DIAGNOSIS — R8290 Unspecified abnormal findings in urine: Secondary | ICD-10-CM

## 2023-06-06 DIAGNOSIS — Z01812 Encounter for preprocedural laboratory examination: Secondary | ICD-10-CM

## 2023-06-06 DIAGNOSIS — R8281 Pyuria: Secondary | ICD-10-CM

## 2023-06-08 DIAGNOSIS — E785 Hyperlipidemia, unspecified: Secondary | ICD-10-CM

## 2023-06-08 DIAGNOSIS — D649 Anemia, unspecified: Secondary | ICD-10-CM

## 2023-06-08 DIAGNOSIS — D509 Iron deficiency anemia, unspecified: Secondary | ICD-10-CM

## 2023-06-08 DIAGNOSIS — I10 Essential (primary) hypertension: Secondary | ICD-10-CM

## 2023-06-08 DIAGNOSIS — Z01818 Encounter for other preprocedural examination: Secondary | ICD-10-CM

## 2023-06-08 DIAGNOSIS — D631 Anemia in chronic kidney disease: Secondary | ICD-10-CM

## 2023-06-08 DIAGNOSIS — N1832 Chronic kidney disease, stage 3b: Secondary | ICD-10-CM

## 2023-06-08 DIAGNOSIS — I5031 Acute diastolic (congestive) heart failure: Secondary | ICD-10-CM

## 2023-06-09 ENCOUNTER — Ambulatory Visit
Admission: RE | Admit: 2023-06-09 | Discharge: 2023-06-09 | Disposition: A | Discharge status: Home / Self Care | Payer: Medicare HMO | Source: Ambulatory Visit | Attending: Urology | Admitting: Urology

## 2023-06-09 ENCOUNTER — Other Ambulatory Visit: Payer: Self-pay | Admitting: Orthopedic Surgery

## 2023-06-09 DIAGNOSIS — S46001A Unspecified injury of muscle(s) and tendon(s) of the rotator cuff of right shoulder, initial encounter: Secondary | ICD-10-CM | POA: Diagnosis not present

## 2023-06-09 DIAGNOSIS — R109 Unspecified abdominal pain: Secondary | ICD-10-CM | POA: Diagnosis not present

## 2023-06-09 DIAGNOSIS — N2889 Other specified disorders of kidney and ureter: Secondary | ICD-10-CM | POA: Insufficient documentation

## 2023-06-09 DIAGNOSIS — K449 Diaphragmatic hernia without obstruction or gangrene: Secondary | ICD-10-CM | POA: Diagnosis not present

## 2023-06-09 DIAGNOSIS — M25511 Pain in right shoulder: Secondary | ICD-10-CM | POA: Diagnosis not present

## 2023-06-09 DIAGNOSIS — M25311 Other instability, right shoulder: Secondary | ICD-10-CM | POA: Diagnosis not present

## 2023-06-09 MED ORDER — GADOBUTROL 1 MMOL/ML IV SOLN
7.0000 mL | Freq: Once | INTRAVENOUS | Status: AC | PRN
  Administered 2023-06-09: 7 mL via INTRAVENOUS

## 2023-06-15 ENCOUNTER — Encounter: Payer: Self-pay | Admitting: Urology

## 2023-06-17 ENCOUNTER — Inpatient Hospital Stay: Payer: Medicare HMO | Attending: Oncology

## 2023-06-17 ENCOUNTER — Inpatient Hospital Stay (HOSPITAL_BASED_OUTPATIENT_CLINIC_OR_DEPARTMENT_OTHER): Payer: Medicare HMO | Admitting: Oncology

## 2023-06-17 ENCOUNTER — Encounter: Payer: Self-pay | Admitting: Oncology

## 2023-06-17 VITALS — BP 127/56 | HR 91 | Temp 98.1°F | Wt 164.5 lb

## 2023-06-17 DIAGNOSIS — E669 Obesity, unspecified: Secondary | ICD-10-CM | POA: Diagnosis not present

## 2023-06-17 DIAGNOSIS — Z809 Family history of malignant neoplasm, unspecified: Secondary | ICD-10-CM | POA: Insufficient documentation

## 2023-06-17 DIAGNOSIS — G8929 Other chronic pain: Secondary | ICD-10-CM | POA: Diagnosis not present

## 2023-06-17 DIAGNOSIS — F32A Depression, unspecified: Secondary | ICD-10-CM | POA: Diagnosis not present

## 2023-06-17 DIAGNOSIS — Z886 Allergy status to analgesic agent status: Secondary | ICD-10-CM | POA: Diagnosis not present

## 2023-06-17 DIAGNOSIS — N184 Chronic kidney disease, stage 4 (severe): Secondary | ICD-10-CM | POA: Diagnosis not present

## 2023-06-17 DIAGNOSIS — Z833 Family history of diabetes mellitus: Secondary | ICD-10-CM | POA: Insufficient documentation

## 2023-06-17 DIAGNOSIS — F419 Anxiety disorder, unspecified: Secondary | ICD-10-CM | POA: Diagnosis not present

## 2023-06-17 DIAGNOSIS — Z8269 Family history of other diseases of the musculoskeletal system and connective tissue: Secondary | ICD-10-CM | POA: Diagnosis not present

## 2023-06-17 DIAGNOSIS — D631 Anemia in chronic kidney disease: Secondary | ICD-10-CM | POA: Insufficient documentation

## 2023-06-17 DIAGNOSIS — I509 Heart failure, unspecified: Secondary | ICD-10-CM | POA: Insufficient documentation

## 2023-06-17 DIAGNOSIS — I7300 Raynaud's syndrome without gangrene: Secondary | ICD-10-CM | POA: Insufficient documentation

## 2023-06-17 DIAGNOSIS — Z9049 Acquired absence of other specified parts of digestive tract: Secondary | ICD-10-CM | POA: Insufficient documentation

## 2023-06-17 DIAGNOSIS — Z888 Allergy status to other drugs, medicaments and biological substances status: Secondary | ICD-10-CM | POA: Diagnosis not present

## 2023-06-17 DIAGNOSIS — R778 Other specified abnormalities of plasma proteins: Secondary | ICD-10-CM

## 2023-06-17 DIAGNOSIS — I130 Hypertensive heart and chronic kidney disease with heart failure and stage 1 through stage 4 chronic kidney disease, or unspecified chronic kidney disease: Secondary | ICD-10-CM | POA: Diagnosis not present

## 2023-06-17 DIAGNOSIS — Z8249 Family history of ischemic heart disease and other diseases of the circulatory system: Secondary | ICD-10-CM | POA: Insufficient documentation

## 2023-06-17 DIAGNOSIS — Z820 Family history of epilepsy and other diseases of the nervous system: Secondary | ICD-10-CM | POA: Diagnosis not present

## 2023-06-17 DIAGNOSIS — E611 Iron deficiency: Secondary | ICD-10-CM | POA: Diagnosis present

## 2023-06-17 DIAGNOSIS — E785 Hyperlipidemia, unspecified: Secondary | ICD-10-CM | POA: Insufficient documentation

## 2023-06-17 DIAGNOSIS — Z79899 Other long term (current) drug therapy: Secondary | ICD-10-CM | POA: Diagnosis not present

## 2023-06-17 LAB — IRON AND TIBC
Iron: 43 ug/dL (ref 28–170)
Saturation Ratios: 17 % (ref 10.4–31.8)
TIBC: 260 ug/dL (ref 250–450)
UIBC: 217 ug/dL

## 2023-06-17 LAB — CBC
HCT: 27.3 % — ABNORMAL LOW (ref 36.0–46.0)
Hemoglobin: 8.7 g/dL — ABNORMAL LOW (ref 12.0–15.0)
MCH: 29.4 pg (ref 26.0–34.0)
MCHC: 31.9 g/dL (ref 30.0–36.0)
MCV: 92.2 fL (ref 80.0–100.0)
Platelets: 394 K/uL (ref 150–400)
RBC: 2.96 MIL/uL — ABNORMAL LOW (ref 3.87–5.11)
RDW: 15.5 % (ref 11.5–15.5)
WBC: 8.6 K/uL (ref 4.0–10.5)
nRBC: 0 % (ref 0.0–0.2)

## 2023-06-17 LAB — FERRITIN: Ferritin: 192 ng/mL (ref 11–307)

## 2023-06-17 NOTE — Progress Notes (Signed)
Hematology/Oncology Consult note [facility]  [phone] [phone]  Patient Care Team: Myrene Buddy, NP as PCP - General (Internal Medicine) Caryl Ada, MD as Referring Physician (Gastroenterology) Dayna Barker, MD as Referring Physician (Internal Medicine) Creig Hines, MD as Consulting Physician (Hematology and Oncology) Vida Rigger, MD as Consulting Physician (Pulmonary Disease)   Name of the patient: Tonya Cummings  [id number]  February 28, 1951   Date of visit: 06/17/23  Diagnosis-anemia of chronic kidney disease  Chief complaint/ Reason for visit-routine follow-up of anemia  Heme/Onc history:  Patient is a 72 year old female with a history of seropositive rheumatoid arthritis for which she is on Plaquenil methotrexate and Humira.  She was seen by Dr. Merlene Pulling back in September 2020 when her hemoglobin was 8 and she had conclusive evidence of iron deficiency with a ferritin level of 10 and iron saturation of 6%.  She received Venofer back then and her hemoglobin improved to 10 and has remained around that range since then.Colonoscopy on 08/15/2020 revealed one 5 mm polyp in the proximal ascending colon (tubular adenoma). There was diverticulosis in the sigmoid colon. EGD was normal.  B12 and folate have been normal in the past.  She has a history of IgG lambda MGUS is being monitored   She has stage III CKD and follows up with Dr. Cherylann Ratel.   Interval history-patient has ongoing issues with her right shoulder.  She is also seeing urology for right kidney mass withCould be potentially malignancy.  She has chronic fatigue.  ECOG PS- 2 Pain scale- 0   Review of systems- Review of Systems  Constitutional:  Positive for malaise/fatigue. Negative for chills, fever and weight loss.  HENT:  Negative for congestion, ear discharge and nosebleeds.   Eyes:  Negative for blurred vision.  Respiratory:  Negative for  cough, hemoptysis, sputum production, shortness of breath and wheezing.   Cardiovascular:  Negative for chest pain, palpitations, orthopnea and claudication.  Gastrointestinal:  Negative for abdominal pain, blood in stool, constipation, diarrhea, heartburn, melena, nausea and vomiting.  Genitourinary:  Negative for dysuria, flank pain, frequency, hematuria and urgency.  Musculoskeletal:  Positive for joint pain. Negative for back pain and myalgias.  Skin:  Negative for rash.  Neurological:  Negative for dizziness, tingling, focal weakness, seizures, weakness and headaches.  Endo/Heme/Allergies:  Does not bruise/bleed easily.  Psychiatric/Behavioral:  Negative for depression and suicidal ideas. The patient does not have insomnia.       Allergies  Allergen Reactions   Gabapentin Other (See Comments)    Weight gain   Ibuprofen Other (See Comments)    Headache     Past Medical History:  Diagnosis Date   Anxiety    CHF (congestive heart failure) (HCC)    Chronic kidney disease    Chronic pain    Chronic radicular pain of lower back    Costochondritis    Depression    Encounter for blood transfusion    GERD (gastroesophageal reflux disease)    Hip dysplasia    Hyperlipidemia    Hypertension    Iron deficiency anemia    Low back pain    Low vitamin B12 level    Migraines    Nose colonized with MRSA 10/03/2020   a.) preop PCR (+) 10/03/2020 prior to RIGHT TKA   Obesity    Osteoporosis    Pelvic fracture (HCC)    Raynaud's disease without gangrene    Restless leg syndrome    Rheumatoid aortitis  Rheumatoid arthritis (HCC)    Seasonal allergies    Sleep disorder    Thoracic compression fracture [facility])      Past Surgical History:  Procedure Laterality Date   ABDOMINAL SURGERY     pt denies   APPENDECTOMY     CARPAL TUNNEL RELEASE Bilateral    CHOLECYSTECTOMY     COLONOSCOPY WITH PROPOFOL N/A 08/15/2020   Procedure: COLONOSCOPY WITH PROPOFOL;  Surgeon: Earline Mayotte, MD;  Location: [facility] ENDOSCOPY;  Service: Endoscopy;  Laterality: N/A;   DILATION AND CURETTAGE OF UTERUS     ESOPHAGOGASTRODUODENOSCOPY (EGD) WITH PROPOFOL N/A 08/15/2020   Procedure: ESOPHAGOGASTRODUODENOSCOPY (EGD) WITH PROPOFOL;  Surgeon: Earline Mayotte, MD;  Location: [facility] ENDOSCOPY;  Service: Endoscopy;  Laterality: N/A;   FRACTURE SURGERY     hip fracture    FRACTURE SURGERY     pelvic fracture plate    HIP SURGERY Left    KNEE ARTHROPLASTY Right 10/13/2020   Procedure: COMPUTER ASSISTED TOTAL KNEE ARTHROPLASTY - RNFA;  Surgeon: Donato Heinz, MD;  Location: [facility] ORS;  Service: Orthopedics;  Laterality: Right;   TUBAL LIGATION      Social History   Socioeconomic History   Marital status: Married    Spouse name: Not on file   Number of children: Not on file   Years of education: Not on file   Highest education level: Not on file  Occupational History   Not on file  Tobacco Use   Smoking status: Never   Smokeless tobacco: Never  Vaping Use   Vaping Use: Never used  Substance and Sexual Activity   Alcohol use: No   Drug use: No   Sexual activity: Yes  Other Topics Concern   Not on file  Social History Narrative   Not on file   Social Determinants of Health   Financial Resource Strain: Not on file  Food Insecurity: Not on file  Transportation Needs: Not on file  Physical Activity: Not on file  Stress: Not on file  Social Connections: Not on file  Intimate Partner Violence: Not on file    Family History  Problem Relation Age of Onset   Diabetes Mother    Hypertension Mother    Aneurysm Father    Diabetes Son    Seizures Son    Osteosarcoma Brother    Cancer Brother    Diabetes Brother    Diabetes Maternal Grandfather    Heart disease Maternal Grandfather      Current Outpatient Medications:    Acetaminophen-Caffeine (EXCEDRIN TENSION HEADACHE) 500-65 MG TABS, Take 1 tablet by mouth daily as needed (Headache)., Disp: , Rfl:    calcitRIOL  (ROCALTROL) 0.25 MCG capsule, Take 0.25 mcg by mouth daily., Disp: , Rfl:    Calcium Carb-Cholecalciferol 600-400 MG-UNIT CAPS, Take 1 capsule by mouth 3 (three) times daily., Disp: , Rfl:    Cholecalciferol 50 MCG (2000 UT) CAPS, Take 2,000 Units by mouth daily., Disp: , Rfl:    cyclobenzaprine (FLEXERIL) 10 MG tablet, Take 10 mg by mouth at bedtime., Disp: , Rfl:    enalapril-hydrochlorothiazide (VASERETIC) 10-25 MG tablet, Take 1 tablet by mouth daily., Disp: , Rfl:    ferrous sulfate 325 (65 FE) MG tablet, Take 325 mg by mouth daily with breakfast., Disp: , Rfl:    furosemide (LASIX) 40 MG tablet, Take 60 mg by mouth daily., Disp: , Rfl:    hydroxychloroquine (PLAQUENIL) 200 MG tablet, Take 1 tablet by mouth 2 (two) times daily., Disp: ,  Rfl:    mirtazapine (REMERON) 30 MG tablet, Take 1 tablet by mouth at bedtime., Disp: , Rfl:    Multiple Vitamins-Minerals (MULTIVITAMIN ADULT PO), Take 1 tablet by mouth daily. , Disp: , Rfl:    omeprazole (PRILOSEC) 40 MG capsule, Take 40 mg by mouth daily., Disp: , Rfl:    oxyCODONE (ROXICODONE) 5 MG immediate release tablet, Take 1 tablet (5 mg total) by mouth every 6 (six) hours as needed for up to 15 doses., Disp: 15 tablet, Rfl: 0   Pirfenidone 267 MG TABS, Take 3 tablets by mouth 3 (three) times daily., Disp: , Rfl:    pramipexole (MIRAPEX) 0.125 MG tablet, Take 0.125 mg by mouth daily. Take 0.125 mg in the morning and 2 at bedtime, Disp: , Rfl:    PROLIA 60 MG/ML SOSY injection, Inject 60 mg into the skin every 6 (six) months., Disp: , Rfl:    rosuvastatin (CRESTOR) 5 MG tablet, Take 5 mg by mouth every other day. , Disp: , Rfl:    spironolactone (ALDACTONE) 25 MG tablet, Take 25 mg by mouth once., Disp: , Rfl:    Venlafaxine HCl 75 MG TB24, Take 1 tablet by mouth daily. Take daily with 150 mg for total 225 mg daily, Disp: , Rfl:    venlafaxine XR (EFFEXOR-XR) 150 MG 24 hr capsule, Take 150 mg by mouth daily with breakfast. Take with 75 mg for total  225 mg daily, Disp: , Rfl:    vitamin B-12 (CYANOCOBALAMIN) 500 MCG tablet, Take 500 mcg by mouth daily., Disp: , Rfl:   Physical exam:  Vitals:   06/17/23 1312  BP: (!) 127/56  Pulse: 91  Temp: 98.1 F (36.7 C)  TempSrc: Tympanic  SpO2: 96%  Weight: 164 lb 8 oz (74.6 kg)   Physical Exam Cardiovascular:     Rate and Rhythm: Normal rate and regular rhythm.     Heart sounds: Normal heart sounds.  Pulmonary:     Effort: Pulmonary effort is normal.     Breath sounds: Normal breath sounds.  Abdominal:     General: Bowel sounds are normal.     Palpations: Abdomen is soft.  Musculoskeletal:     Comments: Brace in place over right shoulder  Skin:    General: Skin is warm and dry.  Neurological:     Mental Status: She is alert and oriented to person, place, and time.         Latest Ref Rng & Units 05/28/2023   11:40 AM  CMP  Glucose 70 - 99 mg/dL 95   BUN 8 - 23 mg/dL 67   Creatinine 1.61 - 1.00 mg/dL 0.96   Sodium 045 - 409 mmol/L 134   Potassium 3.5 - 5.1 mmol/L 4.4   Chloride 98 - 111 mmol/L 98   CO2 22 - 32 mmol/L 23   Calcium 8.9 - 10.3 mg/dL 7.5   Total Protein 6.5 - 8.1 g/dL 7.7   Total Bilirubin 0.3 - 1.2 mg/dL 0.5   Alkaline Phos 38 - 126 U/L 85   AST 15 - 41 U/L 21   ALT 0 - 44 U/L 15       Latest Ref Rng & Units 06/17/2023   12:29 PM  CBC  WBC 4.0 - 10.5 K/uL 8.6   Hemoglobin 12.0 - 15.0 g/dL 8.7   Hematocrit 81.1 - 46.0 % 27.3   Platelets 150 - 400 K/uL 394     No images are attached to the encounter.  MR  ABDOMEN W WO CONTRAST  Result Date: 06/14/2023 CLINICAL DATA:  Right renal mass on CT stone study performed for flank pain. EXAM: MRI ABDOMEN WITHOUT AND WITH CONTRAST TECHNIQUE: Multiplanar multisequence MR imaging of the abdomen was performed both before and after the administration of intravenous contrast. CONTRAST:  7mL GADAVIST GADOBUTROL 1 MMOL/ML IV SOLN COMPARISON:  05/28/2023 CT FINDINGS: Portions of exam are mildly motion degraded. Lower  chest: Mild cardiomegaly, without pericardial or pleural effusion. Moderate hiatal hernia. Hepatobiliary: No cirrhosis. 3 subtle foci of T2 hyperintensity and arterial phase postcontrast enhancement. Example within the high right hepatic lobe posteriorly at 6 mm on 22/20 and 4-5 mm anteriorly on 22/20. Within the central right hepatic lobe at 8 mm on 30/20. These areas are isoenhancing to the remainder of the liver on later post-contrast imaging. Gallbladder not identified and presumably surgically absent. No biliary duct dilatation. Pancreas:  Normal, without mass or ductal dilatation. Spleen:  Normal in size, without focal abnormality. Adrenals/Urinary Tract: Normal adrenal glands. Within both kidneys there are T2 hyperintense lesions of maximally 11 mm which are likely cysts but suboptimally evaluated secondary to motion. Corresponding the CT abnormality, within the central interpolar right kidney is a heterogeneously T2 hyperintense 5.8 x 4.7 x 4.9 cm mass on 24/11 and 16/3 which demonstrates heterogeneous post-contrast enhancement including on 59/2. There may be minimal secondary caliectasis in the upper and lower pole. Stomach/Bowel: Normal distal stomach and abdominal bowel loops. Vascular/Lymphatic: Aortic atherosclerosis. Patent renal veins. No retroperitoneal or retrocrural adenopathy. Other:  No ascites. Musculoskeletal: A focus of enhancement within the right side of the thoracolumbar spine at approximately T12 measures 9 mm on 24/26. S shaped thoracolumbar spine curvature. Thoracolumbar compression deformities are suboptimally evaluated. Sacroiliac joint fixation. IMPRESSION: 1. Mildly motion degraded exam. 2. Central interpolar right renal heterogeneously enhancing mass is consistent with neoplasm. Favor central renal cell carcinoma over urothelial carcinoma. No renal vein involvement or abdominal adenopathy. 3. Subtle T2 hyperintense, arterially hyperenhancing liver lesions are indeterminate,  especially given small size and motion. These most likely represent hemangiomas. Early metastasis could look similar. Consider pre and post contrast abdominal MRI follow-up at 3-6 months. 4. Right-sided approximately T12 focus of hyperenhancement is favored to be degenerative. Recommend attention on follow-up to exclude isolated osseous metastasis. 5. Incidental findings, including: Moderate hiatal hernia. Aortic Atherosclerosis (ICD10-I70.0). Thoracolumbar compression deformities. Electronically Signed   By: Jeronimo Greaves M.D.   On: 06/14/2023 12:27   CT Renal Stone Study  Result Date: 05/28/2023 CLINICAL DATA:  Abdominal pain, flank pain EXAM: CT ABDOMEN AND PELVIS WITHOUT CONTRAST TECHNIQUE: Multidetector CT imaging of the abdomen and pelvis was performed following the standard protocol without IV contrast. RADIATION DOSE REDUCTION: This exam was performed according to the departmental dose-optimization program which includes automated exposure control, adjustment of the mA and/or kV according to patient size and/or use of iterative reconstruction technique. COMPARISON:  None Available. FINDINGS: Lower chest: There is focal alveolar infiltrate in the posteromedial left lower lung field. There are scattered foci of patchy ground-glass densities in both lower lung fields. There is mild ectasia of bronchi in the lower lung fields. There are a few noncalcified nodules in both lower lung fields measuring up to 8 mm in size. Similar finding was seen in previous CT chest done on 04/02/2022. Heart is enlarged in size. Coronary artery calcifications are seen. Hepatobiliary: No focal abnormalities are seen in liver. There is no dilation of bile ducts. Gallbladder is not seen. Pancreas: Atrophy.  No focal abnormalities  are seen. Spleen: Unremarkable. Adrenals/Urinary Tract: Adrenals are unremarkable. There is no hydronephrosis. There is 5.7 x 4.2 cm mass lesion in the medial lower portion of right kidney. There are no  renal or ureteral stones. There is 1.2 cm exophytic lesion in the lower pole of right kidney, possibly a cyst. Ureters are not dilated. Beam hardening artifacts limit evaluation of bladder. Stomach/Bowel: Moderate to large fixed hiatal hernia is seen. Intra-abdominal portion of stomach is not distended. Small bowel loops are unremarkable. Appendix is not distinctly seen. There is no pericecal inflammation. Moderate to large amount of stool is seen in colon, more so in the right colon. There is no wall thickening in colon. There is no pericolic stranding. Vascular/Lymphatic: Scattered arterial calcifications are seen. Reproductive: Unremarkable. Other: There is no ascites or pneumoperitoneum. Umbilical hernia containing fat is seen. Musculoskeletal: Metallic densities noted in the pubic bones from previous internal fixation. There are surgical screws transfixing the SI joints. There is 60 % decrease in height of body of L1 vertebra. There is 30-40% decrease in height of body of T11 vertebra. There is no demonstrable fracture line. This finding has not changed in comparison with CT lumbar spine done on 03/16/2018. Degenerative changes are noted in lumbar spine with spinal stenosis and encroachment of neural foramina at multiple levels. IMPRESSION: There is 5.7 cm mass in the medial aspect of right kidney. Evaluation is limited without intravenous contrast. Possibility of malignant neoplasm in right kidney is not excluded. Follow-up contrast enhanced CT, PET-CT and tissue sampling as warranted should be considered. There is no hydronephrosis. Possible 1.2 cm cyst in the lower pole of right kidney. There is focal alveolar infiltrate in the posteromedial left lower lung fields suggesting pneumonia in left lower lobe. There are scattered patchy faint ground-glass densities in the lower lung fields suggesting scarring or interstitial pneumonia. There are multiple noncalcified nodules in both lower lung fields measuring up  to 8 mm in size. Similar findings were seen in previous CT chest suggesting possible granulomas. There is no evidence of intestinal obstruction or pneumoperitoneum. Moderate to large fixed hiatal hernia. There is decrease in height of bodies of T11 and L1 vertebrae with no significant interval change suggesting old compression fractures. Degenerative changes are noted in lumbar spine. Arteriosclerosis.  Coronary artery disease. Other findings as described in the body of the report. Electronically Signed   By: Ernie Avena M.D.   On: 05/28/2023 13:52   DG Chest 2 View  Result Date: 05/28/2023 CLINICAL DATA:  Low oxygen saturation and history of CHF. EXAM: CHEST - 2 VIEW COMPARISON:  October 12, 2021 FINDINGS: The cardiomediastinal silhouette is unchanged in contour.Similar reticular markings at the RIGHT lung base in comparison to prior consistent with scar. No pleural effusion. No pneumothorax. No acute pleuroparenchymal abnormality. Visualized abdomen is unremarkable. Multiple remote RIGHT-sided rib fractures. Similar wedging of a vertebral body at the thoracolumbar junction. Question increased wedging of a vertebral body at the upper lumbar spine. Multilevel degenerative changes of the thoracic spine. IMPRESSION: 1.  No acute cardiopulmonary abnormality. 2. Question increased wedging of a vertebral body at the upper lumbar spine. Recommend correlation with point tenderness. Electronically Signed   By: Meda Klinefelter M.D.   On: 05/28/2023 09:52   DG Shoulder Right  Result Date: 05/28/2023 CLINICAL DATA:  pain EXAM: RIGHT SHOULDER - 2+ VIEW COMPARISON:  CT dated April 02, 2022. FINDINGS: Osteopenia. No acute fracture or dislocation. Moderate degenerative changes of the acromioclavicular joint with osteophyte formation. Favored  subacromial interval narrowing, suboptimally assessed by positioning. Mild degenerative changes of the glenohumeral joint. Multiple remote RIGHT-sided rib fractures at the  RIGHT posterior fourth, posterior fifth and lateral seventh ribs. Age indeterminate rib fractures of the RIGHT anterolateral sixth and seventh ribs, favored remote. No area of erosion or osseous destruction. No unexpected radiopaque foreign body. Soft tissues are unremarkable. IMPRESSION: 1. No acute fracture or dislocation. 2. Narrowing of the subacromial interval may reflect underlying rotator cuff injury. 3. Multiple favored remote RIGHT-sided rib fractures. Electronically Signed   By: Meda Klinefelter M.D.   On: 05/28/2023 09:49     Assessment and plan- Patient is a 72 y.o. female here for routine follow-up for anemia of chronic kidney disease  Patient's hemoglobin has been between 8-8.8 over the last 7 months.  Anemia workup was otherwise negative and I had recommended EPO 4 months ago for anemia of kidney disease.  However patient was concerned about financial aspects and co-pay associated with it.  Her hemoglobin has not dropped any further but has not improved either.  She was recently diagnosed with a right renal mass.  Erythropoietin stimulating agents can be associated with growth of malignancy and therefore I am holding off on starting Retacrit at this time until her kidney mass is evaluated further by urology.  CBC ferritin and iron studies in3 and 6 months and I will see her back in 6 months   Visit Diagnosis 1. Anemia of chronic kidney failure, stage 4 (severe) (HCC)      Dr. Owens Shark, MD, MPH [facility] [phone] 06/17/2023 3:34 PM

## 2023-06-20 ENCOUNTER — Telehealth: Payer: Self-pay

## 2023-06-20 LAB — KAPPA/LAMBDA LIGHT CHAINS
Kappa free light chain: 78.2 mg/L — ABNORMAL HIGH (ref 3.3–19.4)
Kappa, lambda light chain ratio: 0.94 (ref 0.26–1.65)
Lambda free light chains: 83.1 mg/L — ABNORMAL HIGH (ref 5.7–26.3)

## 2023-06-20 NOTE — Telephone Encounter (Signed)
Tonya Cummings sent a mychart message on 06/17/23 that she has a cancerous mass on her kidney. Her neck surgery has been canceled. She has been instructed to contact our office if/when she is ready to reschedule.

## 2023-06-22 LAB — MULTIPLE MYELOMA PANEL, SERUM
Albumin SerPl Elph-Mcnc: 3.3 g/dL (ref 2.9–4.4)
Albumin/Glob SerPl: 0.9 (ref 0.7–1.7)
Alpha 1: 0.3 g/dL (ref 0.0–0.4)
Alpha2 Glob SerPl Elph-Mcnc: 0.9 g/dL (ref 0.4–1.0)
B-Globulin SerPl Elph-Mcnc: 1.7 g/dL — ABNORMAL HIGH (ref 0.7–1.3)
Gamma Glob SerPl Elph-Mcnc: 1 g/dL (ref 0.4–1.8)
Globulin, Total: 3.7 g/dL (ref 2.2–3.9)
IgA: 193 mg/dL (ref 87–352)
IgG (Immunoglobin G), Serum: 1715 mg/dL — ABNORMAL HIGH (ref 586–1602)
IgM (Immunoglobulin M), Srm: 126 mg/dL (ref 26–217)
M Protein SerPl Elph-Mcnc: 0.7 g/dL — ABNORMAL HIGH
Total Protein ELP: 7 g/dL (ref 6.0–8.5)

## 2023-06-23 ENCOUNTER — Encounter: Payer: Medicare HMO | Admitting: Orthopedic Surgery

## 2023-06-23 ENCOUNTER — Encounter: Payer: Medicare HMO | Admitting: Neurosurgery

## 2023-07-01 ENCOUNTER — Ambulatory Visit: Payer: Medicare HMO | Admitting: Urology

## 2023-07-01 ENCOUNTER — Other Ambulatory Visit
Admission: RE | Admit: 2023-07-01 | Discharge: 2023-07-01 | Disposition: A | Discharge status: Home / Self Care | Payer: Medicare HMO | Attending: Urology | Admitting: Urology

## 2023-07-01 VITALS — BP 125/64 | HR 93 | Ht 63.0 in | Wt 165.0 lb

## 2023-07-01 DIAGNOSIS — N184 Chronic kidney disease, stage 4 (severe): Secondary | ICD-10-CM | POA: Diagnosis not present

## 2023-07-01 DIAGNOSIS — M5030 Other cervical disc degeneration, unspecified cervical region: Secondary | ICD-10-CM | POA: Diagnosis not present

## 2023-07-01 DIAGNOSIS — Z01812 Encounter for preprocedural laboratory examination: Secondary | ICD-10-CM | POA: Insufficient documentation

## 2023-07-01 DIAGNOSIS — D631 Anemia in chronic kidney disease: Secondary | ICD-10-CM

## 2023-07-01 DIAGNOSIS — B9689 Other specified bacterial agents as the cause of diseases classified elsewhere: Secondary | ICD-10-CM | POA: Diagnosis not present

## 2023-07-01 DIAGNOSIS — Z7409 Other reduced mobility: Secondary | ICD-10-CM | POA: Diagnosis not present

## 2023-07-01 DIAGNOSIS — N2889 Other specified disorders of kidney and ureter: Secondary | ICD-10-CM

## 2023-07-01 DIAGNOSIS — M25511 Pain in right shoulder: Secondary | ICD-10-CM | POA: Diagnosis not present

## 2023-07-01 DIAGNOSIS — Z79899 Other long term (current) drug therapy: Secondary | ICD-10-CM | POA: Diagnosis not present

## 2023-07-01 DIAGNOSIS — G8929 Other chronic pain: Secondary | ICD-10-CM | POA: Diagnosis not present

## 2023-07-01 DIAGNOSIS — Z01818 Encounter for other preprocedural examination: Secondary | ICD-10-CM

## 2023-07-01 LAB — URINALYSIS, COMPLETE (UACMP) WITH MICROSCOPIC
Bilirubin Urine: NEGATIVE
Glucose, UA: NEGATIVE mg/dL
Ketones, ur: NEGATIVE mg/dL
Nitrite: NEGATIVE
Protein, ur: NEGATIVE mg/dL
Specific Gravity, Urine: 1.01 (ref 1.005–1.030)
pH: 5 (ref 5.0–8.0)

## 2023-07-02 LAB — URINE CULTURE

## 2023-07-03 LAB — URINE CULTURE

## 2023-07-04 ENCOUNTER — Other Ambulatory Visit: Payer: Self-pay

## 2023-07-04 ENCOUNTER — Other Ambulatory Visit: Payer: Self-pay | Admitting: Urology

## 2023-07-04 DIAGNOSIS — N184 Chronic kidney disease, stage 4 (severe): Secondary | ICD-10-CM | POA: Diagnosis not present

## 2023-07-04 DIAGNOSIS — D631 Anemia in chronic kidney disease: Secondary | ICD-10-CM | POA: Diagnosis not present

## 2023-07-04 DIAGNOSIS — I10 Essential (primary) hypertension: Secondary | ICD-10-CM | POA: Diagnosis not present

## 2023-07-04 DIAGNOSIS — N2581 Secondary hyperparathyroidism of renal origin: Secondary | ICD-10-CM | POA: Diagnosis not present

## 2023-07-04 DIAGNOSIS — N2889 Other specified disorders of kidney and ureter: Secondary | ICD-10-CM

## 2023-07-04 MED ORDER — AMOXICILLIN-POT CLAVULANATE 875-125 MG PO TABS: 1.0000 | ORAL_TABLET | Freq: Two times a day (BID) | ORAL | 0 refills | Status: DC

## 2023-07-04 NOTE — Progress Notes (Unsigned)
Surgical Physician Order Form [facility]   * Scheduling expectation : Next Available  *Length of Case:   *Clearance needed: no  *Anticoagulation Instructions: Hold all anticoagulants  *Aspirin Instructions: N/A  *Post-op visit Date/Instructions:   1 week post op/ possible stent removal  *Diagnosis: Right renal mass  *Procedure: right  retrograde pyelogram, right ureteral stent placement, possible right renal biopsy, possible right ureteral stent   Additional orders: N/A  -Admit type: OUTpatient  -Anesthesia: General  -VTE Prophylaxis Standing Order SCD's       Other:   -Standing Lab Orders Per Anesthesia    Lab other: UCx done in clinic, periop abx per procedure note  -Standing Test orders EKG/Chest x-ray per Anesthesia       Test other:   - Medications:  Ancef 2gm IV  -Other orders:  N/A

## 2023-07-04 NOTE — Progress Notes (Signed)
I, Tonya Cummings,acting as a scribe for Tonya Scotland, MD.,have documented all relevant documentation on the behalf of Tonya Scotland, MD,as directed by  Tonya Scotland, MD while in the presence of Tonya Scotland, MD.   07/01/23 8:10 PM   Tonya Cummings 1951-01-07 [id number]  Referring provider: Myrene Buddy, NP [address] [place],  [place] [address]  Chief Complaint  Patient presents with   discuss possible nephrectomy    HPI: 72 year-old female with a personal history of a large right renal mass measuring 5.7 x 4.2 with a very central location. She presents today to discuss possibility of nephrectomy.   Notably, she does have multiple medical comorbidities including history of CKD with baseline creatinine in the 2.5 to 3 range with a GFR of 18. She also has Rheumatoid arthritis on Plaquenil and Humira.  She's had a previous appendectomy and colostectomy, along with a tubal ligation. In terms of CHF, she's followed by Dr. Lewie Cummings. Her left ventricular ejection fraction is 50-55 at the time of her last echo.  She denies any gross hematuria. She is followed by her nephrologist Dr. Cherylann Cummings.   PMH: Past Medical History:  Diagnosis Date   Anxiety    CHF (congestive heart failure) (HCC)    Chronic kidney disease    Chronic pain    Chronic radicular pain of lower back    Costochondritis    Depression    Encounter for blood transfusion    GERD (gastroesophageal reflux disease)    Hip dysplasia    Hyperlipidemia    Hypertension    Iron deficiency anemia    Low back pain    Low vitamin B12 level    Migraines    Nose colonized with MRSA 10/03/2020   a.) preop PCR (+) 10/03/2020 prior to RIGHT TKA   Obesity    Osteoporosis    Pelvic fracture (HCC)    Raynaud's disease without gangrene    Restless leg syndrome    Rheumatoid aortitis    Rheumatoid arthritis (HCC)    Seasonal allergies    Sleep disorder    Thoracic compression fracture [facility])     Surgical  History: Past Surgical History:  Procedure Laterality Date   ABDOMINAL SURGERY     pt denies   APPENDECTOMY     CARPAL TUNNEL RELEASE Bilateral    CHOLECYSTECTOMY     COLONOSCOPY WITH PROPOFOL N/A 08/15/2020   Procedure: COLONOSCOPY WITH PROPOFOL;  Surgeon: Earline Mayotte, MD;  Location: [facility] ENDOSCOPY;  Service: Endoscopy;  Laterality: N/A;   DILATION AND CURETTAGE OF UTERUS     ESOPHAGOGASTRODUODENOSCOPY (EGD) WITH PROPOFOL N/A 08/15/2020   Procedure: ESOPHAGOGASTRODUODENOSCOPY (EGD) WITH PROPOFOL;  Surgeon: Earline Mayotte, MD;  Location: [facility] ENDOSCOPY;  Service: Endoscopy;  Laterality: N/A;   FRACTURE SURGERY     hip fracture    FRACTURE SURGERY     pelvic fracture plate    HIP SURGERY Left    KNEE ARTHROPLASTY Right 10/13/2020   Procedure: COMPUTER ASSISTED TOTAL KNEE ARTHROPLASTY - RNFA;  Surgeon: Donato Heinz, MD;  Location: [facility] ORS;  Service: Orthopedics;  Laterality: Right;   TUBAL LIGATION      Home Medications:  Allergies as of 07/01/2023       Reactions   Gabapentin Other (See Comments)   Weight gain   Ibuprofen Other (See Comments)   Headache        Medication List        Accurate as of July 01, 2023 11:59 PM. If you have any questions, ask your nurse or doctor.          STOP taking these medications    oxyCODONE 5 MG immediate release tablet Commonly known as: Roxicodone Stopped by: Tonya Cummings       TAKE these medications    calcitRIOL 0.25 MCG capsule Commonly known as: ROCALTROL Take 0.25 mcg by mouth daily.   Calcium Carb-Cholecalciferol 600-400 MG-UNIT Caps Take 1 capsule by mouth 3 (three) times daily.   Cholecalciferol 50 MCG (2000 UT) Caps Take 2,000 Units by mouth daily.   cyanocobalamin 500 MCG tablet Commonly known as: VITAMIN B12 Take 500 mcg by mouth daily.   cyclobenzaprine 10 MG tablet Commonly known as: FLEXERIL Take 10 mg by mouth at bedtime.   enalapril-hydrochlorothiazide 10-25 MG tablet Commonly  known as: VASERETIC Take 1 tablet by mouth daily.   Excedrin Tension Headache 500-65 MG Tabs per tablet Generic drug: acetaminophen-caffeine Take 1 tablet by mouth daily as needed (Headache).   ferrous sulfate 325 (65 FE) MG tablet Take 325 mg by mouth daily with breakfast.   furosemide 40 MG tablet Commonly known as: LASIX Take 60 mg by mouth daily.   hydroxychloroquine 200 MG tablet Commonly known as: PLAQUENIL Take 1 tablet by mouth 2 (two) times daily.   mirtazapine 30 MG tablet Commonly known as: REMERON Take 1 tablet by mouth at bedtime.   MULTIVITAMIN ADULT PO Take 1 tablet by mouth daily.   omeprazole 40 MG capsule Commonly known as: PRILOSEC Take 40 mg by mouth daily.   Pirfenidone 267 MG Tabs Take 3 tablets by mouth 3 (three) times daily.   pramipexole 0.125 MG tablet Commonly known as: MIRAPEX Take 0.125 mg by mouth daily. Take 0.125 mg in the morning and 2 at bedtime   Prolia 60 MG/ML Sosy injection Generic drug: denosumab Inject 60 mg into the skin every 6 (six) months.   rosuvastatin 5 MG tablet Commonly known as: CRESTOR Take 5 mg by mouth every other day.   spironolactone 25 MG tablet Commonly known as: ALDACTONE Take 25 mg by mouth once.   Venlafaxine HCl 75 MG Tb24 Take 1 tablet by mouth daily. Take daily with 150 mg for total 225 mg daily   venlafaxine XR 150 MG 24 hr capsule Commonly known as: EFFEXOR-XR Take 150 mg by mouth daily with breakfast. Take with 75 mg for total 225 mg daily        Allergies:  Allergies  Allergen Reactions   Gabapentin Other (See Comments)    Weight gain   Ibuprofen Other (See Comments)    Headache    Family History: Family History  Problem Relation Age of Onset   Diabetes Mother    Hypertension Mother    Aneurysm Father    Diabetes Son    Seizures Son    Osteosarcoma Brother    Cancer Brother    Diabetes Brother    Diabetes Maternal Grandfather    Heart disease Maternal Grandfather      Social History:  reports that she has never smoked. She has never used smokeless tobacco. She reports that she does not drink alcohol and does not use drugs.   Physical Exam: BP 125/64   Pulse 93   Ht 5\' 3"  (1.6 m)   Wt 165 lb (74.8 kg)   BMI 29.23 kg/m   Constitutional:  Alert and oriented, No acute distress. HEENT:  AT, moist mucus membranes.  Trachea midline, no masses. Neurologic:  Grossly intact, no focal deficits, moving all 4 extremities. Psychiatric: Normal mood and affect.  Urinalysis    Component Value Date/Time   COLORURINE YELLOW 07/01/2023 1511   APPEARANCEUR CLEAR 07/01/2023 1511   LABSPEC 1.010 07/01/2023 1511   PHURINE 5.0 07/01/2023 1511   GLUCOSEU NEGATIVE 07/01/2023 1511   HGBUR TRACE (A) 07/01/2023 1511   BILIRUBINUR NEGATIVE 07/01/2023 1511   KETONESUR NEGATIVE 07/01/2023 1511   PROTEINUR NEGATIVE 07/01/2023 1511   NITRITE NEGATIVE 07/01/2023 1511   LEUKOCYTESUR MODERATE (A) 07/01/2023 1511    Lab Results  Component Value Date   BACTERIA FEW (A) 07/01/2023    Assessment & Plan:    Right renal mass -Most consistent with renal cell carcinoma, but unable to rule out urothelial carcinoma. -Can send a urine cytology today for completeness. Could consider diagnostic ureteroscopy but given the appearance, we'll go ahead and proceed with nephrectomy as this is the most likely diagnosis.  -Order chest CT to rule out metastasis.  Stage 4 CKD  - Baseline creatinine 2.5-3, GFR 18. -Discuss with nephrologist (Dr. Cherylann Cummings) regarding the potential need for dialysis post-nephrectomy.  Return for Follow up with nephrologist .   [facility] [address] [place], [place] [address] [phone]

## 2023-07-04 NOTE — H&P (View-Only) (Signed)
I, Tonya Cummings,acting as a scribe for Tonya Scotland, MD.,have documented all relevant documentation on the behalf of Tonya Scotland, MD,as directed by  Tonya Scotland, MD while in the presence of Tonya Scotland, MD.   07/01/23 8:10 PM   Tonya Cummings [birth year] [id number]  Referring provider: Myrene Buddy, NP [address] [place],  [place] [address]  Chief Complaint  Patient presents with   discuss possible nephrectomy    HPI: 71 year-old female with a personal history of a large right renal mass measuring 5.7 x 4.2 with a very central location. She presents today to discuss possibility of nephrectomy.   Notably, she does have multiple medical comorbidities including history of CKD with baseline creatinine in the 2.5 to 3 range with a GFR of 18. She also has Rheumatoid arthritis on Plaquenil and Humira.  She's had a previous appendectomy and  open cholecystectomy, along with a tubal ligation. In terms of CHF, she's followed by Dr. Lewie Cummings. Her left ventricular ejection fraction is 50-55 at the time of her last echo.  She denies any gross hematuria. She is followed by her nephrologist Dr. Cherylann Cummings.   PMH: Past Medical History:  Diagnosis Date   Anxiety    CHF (congestive heart failure) (HCC)    Chronic kidney disease    Chronic pain    Chronic radicular pain of lower back    Costochondritis    Depression    Encounter for blood transfusion    GERD (gastroesophageal reflux disease)    Hip dysplasia    Hyperlipidemia    Hypertension    Iron deficiency anemia    Low back pain    Low vitamin B12 level    Migraines    Nose colonized with MRSA 10/03/2020   a.) preop PCR (+) 10/03/2020 prior to RIGHT TKA   Obesity    Osteoporosis    Pelvic fracture (HCC)    Raynaud's disease without gangrene    Restless leg syndrome    Rheumatoid aortitis    Rheumatoid arthritis (HCC)    Seasonal allergies    Sleep disorder    Thoracic compression fracture [facility])      Surgical History: Past Surgical History:  Procedure Laterality Date   ABDOMINAL SURGERY     pt denies   APPENDECTOMY     CARPAL TUNNEL RELEASE Bilateral    CHOLECYSTECTOMY     COLONOSCOPY WITH PROPOFOL N/A 08/15/2020   Procedure: COLONOSCOPY WITH PROPOFOL;  Surgeon: Earline Mayotte, MD;  Location: [facility] ENDOSCOPY;  Service: Endoscopy;  Laterality: N/A;   DILATION AND CURETTAGE OF UTERUS     ESOPHAGOGASTRODUODENOSCOPY (EGD) WITH PROPOFOL N/A 08/15/2020   Procedure: ESOPHAGOGASTRODUODENOSCOPY (EGD) WITH PROPOFOL;  Surgeon: Earline Mayotte, MD;  Location: [facility] ENDOSCOPY;  Service: Endoscopy;  Laterality: N/A;   FRACTURE SURGERY     hip fracture    FRACTURE SURGERY     pelvic fracture plate    HIP SURGERY Left    KNEE ARTHROPLASTY Right 10/13/2020   Procedure: COMPUTER ASSISTED TOTAL KNEE ARTHROPLASTY - RNFA;  Surgeon: Donato Heinz, MD;  Location: [facility] ORS;  Service: Orthopedics;  Laterality: Right;   TUBAL LIGATION      Home Medications:  Allergies as of 07/01/2023       Reactions   Gabapentin Other (See Comments)   Weight gain   Ibuprofen Other (See Comments)   Headache        Medication List        Accurate as  of July 01, 2023 11:59 PM. If you have any questions, ask your nurse or doctor.          STOP taking these medications    oxyCODONE 5 MG immediate release tablet Commonly known as: Roxicodone Stopped by: Tonya Cummings       TAKE these medications    calcitRIOL 0.25 MCG capsule Commonly known as: ROCALTROL Take 0.25 mcg by mouth daily.   Calcium Carb-Cholecalciferol 600-400 MG-UNIT Caps Take 1 capsule by mouth 3 (three) times daily.   Cholecalciferol 50 MCG (2000 UT) Caps Take 2,000 Units by mouth daily.   cyanocobalamin 500 MCG tablet Commonly known as: VITAMIN B12 Take 500 mcg by mouth daily.   cyclobenzaprine 10 MG tablet Commonly known as: FLEXERIL Take 10 mg by mouth at bedtime.   enalapril-hydrochlorothiazide 10-25 MG  tablet Commonly known as: VASERETIC Take 1 tablet by mouth daily.   Excedrin Tension Headache 500-65 MG Tabs per tablet Generic drug: acetaminophen-caffeine Take 1 tablet by mouth daily as needed (Headache).   ferrous sulfate 325 (65 FE) MG tablet Take 325 mg by mouth daily with breakfast.   furosemide 40 MG tablet Commonly known as: LASIX Take 60 mg by mouth daily.   hydroxychloroquine 200 MG tablet Commonly known as: PLAQUENIL Take 1 tablet by mouth 2 (two) times daily.   mirtazapine 30 MG tablet Commonly known as: REMERON Take 1 tablet by mouth at bedtime.   MULTIVITAMIN ADULT PO Take 1 tablet by mouth daily.   omeprazole 40 MG capsule Commonly known as: PRILOSEC Take 40 mg by mouth daily.   Pirfenidone 267 MG Tabs Take 3 tablets by mouth 3 (three) times daily.   pramipexole 0.125 MG tablet Commonly known as: MIRAPEX Take 0.125 mg by mouth daily. Take 0.125 mg in the morning and 2 at bedtime   Prolia 60 MG/ML Sosy injection Generic drug: denosumab Inject 60 mg into the skin every 6 (six) months.   rosuvastatin 5 MG tablet Commonly known as: CRESTOR Take 5 mg by mouth every other day.   spironolactone 25 MG tablet Commonly known as: ALDACTONE Take 25 mg by mouth once.   Venlafaxine HCl 75 MG Tb24 Take 1 tablet by mouth daily. Take daily with 150 mg for total 225 mg daily   venlafaxine XR 150 MG 24 hr capsule Commonly known as: EFFEXOR-XR Take 150 mg by mouth daily with breakfast. Take with 75 mg for total 225 mg daily        Allergies:  Allergies  Allergen Reactions   Gabapentin Other (See Comments)    Weight gain   Ibuprofen Other (See Comments)    Headache    Family History: Family History  Problem Relation Age of Onset   Diabetes Mother    Hypertension Mother    Aneurysm Father    Diabetes Son    Seizures Son    Osteosarcoma Brother    Cancer Brother    Diabetes Brother    Diabetes Maternal Grandfather    Heart disease Maternal  Grandfather     Social History:  reports that she has never smoked. She has never used smokeless tobacco. She reports that she does not drink alcohol and does not use drugs.   Physical Exam: BP 125/64   Pulse 93   Ht 5\' 3"  (1.6 m)   Wt 165 lb (74.8 kg)   BMI 29.23 kg/m   Constitutional:  Alert and oriented, No acute distress.  Companied by her husband today. HEENT: [place] AT, moist  mucus membranes.  Trachea midline, no masses. Neurologic: Grossly intact, no focal deficits, moving all 4 extremities. Psychiatric: Normal mood and affect.  Urinalysis    Component Value Date/Time   COLORURINE YELLOW 07/01/2023 1511   APPEARANCEUR CLEAR 07/01/2023 1511   LABSPEC 1.010 07/01/2023 1511   PHURINE 5.0 07/01/2023 1511   GLUCOSEU NEGATIVE 07/01/2023 1511   HGBUR TRACE (A) 07/01/2023 1511   BILIRUBINUR NEGATIVE 07/01/2023 1511   KETONESUR NEGATIVE 07/01/2023 1511   PROTEINUR NEGATIVE 07/01/2023 1511   NITRITE NEGATIVE 07/01/2023 1511   LEUKOCYTESUR MODERATE (A) 07/01/2023 1511    Lab Results  Component Value Date   BACTERIA FEW (A) 07/01/2023    Assessment & Plan:    Right renal mass -Most consistent with renal cell carcinoma, but unable to rule out urothelial carcinoma based on its very central location -Can send a urine cytology today for completeness. Could consider diagnostic ureteroscopy but given the appearance, we'll go ahead and proceed with nephrectomy as this is the most likely diagnosis.  -Order chest CT to rule out metastasis. -She is interested in diagnostic ureteroscopy with possible stent, possible biopsy to rule out upper tract TCC.  I do think that this appropriate for surgical planning purposes so the appropriate technique/approach is taken intraoperatively.  Risk of the procedure including risk of bleeding, infection, damage surrounding structures, stent discomfort amongst others were discussed.  Preoperative UA/urine culture today. -Ultimately she will need  nephrectomy versus nephro ureterectomy.  We discussed this in detail today.  Will likely proceed with hand-assisted laparoscopic approach versus robotic if nephro ureterectomy is warranted.  We did briefly discuss risk and benefits.  Stage 4 CKD  - Baseline creatinine 2.5-3, GFR 18. -Discuss with nephrologist (Dr. Cherylann Cummings) regarding the potential need for dialysis post-nephrectomy.  Return for Follow up with nephrologist .  I have reviewed the above documentation for accuracy and completeness, and I agree with the above.   Tonya Scotland, MD  I spent 52 total minutes on the day of the encounter including pre-visit review of the medical record, face-to-face time with the patient, and post visit ordering of labs/imaging/tests.  [facility] [address] [place], [place] [address] [phone]

## 2023-07-05 ENCOUNTER — Other Ambulatory Visit: Payer: Medicare HMO

## 2023-07-05 ENCOUNTER — Encounter: Payer: Self-pay | Admitting: Orthopedic Surgery

## 2023-07-06 ENCOUNTER — Telehealth: Payer: Self-pay

## 2023-07-06 NOTE — Telephone Encounter (Signed)
I spoke with Tonya Cummings. We have discussed possible surgery dates and Monday August 5th, 2024 was agreed upon by all parties. Patient given information about surgery date, what to expect pre-operatively and post operatively.  We discussed that a Pre-Admission Testing office will be calling to set up the pre-op visit that will take place prior to surgery, and that these appointments are typically done over the phone with a Pre-Admissions RN. Informed patient that our office will communicate any additional care to be provided after surgery. Patients questions or concerns were discussed during our call. Advised to call our office should there be any additional information, questions or concerns that arise. Patient verbalized understanding.

## 2023-07-06 NOTE — Progress Notes (Signed)
   [facility] Surgical Posting Form  Surgery Date: Date: 07/18/2023  Surgeon: Dr. [name], MD  Inpt ( No  )   Outpt (Yes)   Obs ( No  )   Diagnosis: N28.89 Right Renal Mass  -CPT: 74420, 47425, [phone]  Surgery: Cystoscopy with Right Retrograde Pyelogram, Possible Right Ureteral Stent Placement, Possible Right Renal Biopsy  Stop Anticoagulations: Yes  Cardiac/Medical/Pulmonary Clearance needed: no  *Orders entered into EPIC  Date: 07/06/23   *Case booked in [place]  Date: 07/06/23  *Notified pt of Surgery: Date: 07/06/23  PRE-OP UA & CX: yes, obtained while in clinic  *Placed into Prior Authorization Work Que Date: 07/06/23  Assistant/laser/rep:No

## 2023-07-07 ENCOUNTER — Ambulatory Visit
Admission: RE | Admit: 2023-07-07 | Discharge: 2023-07-07 | Disposition: A | Discharge status: Home / Self Care | Payer: Medicare HMO | Source: Ambulatory Visit | Attending: Orthopedic Surgery | Admitting: Orthopedic Surgery

## 2023-07-07 DIAGNOSIS — S46011A Strain of muscle(s) and tendon(s) of the rotator cuff of right shoulder, initial encounter: Secondary | ICD-10-CM | POA: Diagnosis not present

## 2023-07-07 DIAGNOSIS — M19011 Primary osteoarthritis, right shoulder: Secondary | ICD-10-CM | POA: Diagnosis not present

## 2023-07-07 DIAGNOSIS — S46001A Unspecified injury of muscle(s) and tendon(s) of the rotator cuff of right shoulder, initial encounter: Secondary | ICD-10-CM

## 2023-07-11 ENCOUNTER — Inpatient Hospital Stay: Admission: RE | Admit: 2023-07-11 | Payer: Medicare HMO | Source: Home / Self Care | Admitting: Neurosurgery

## 2023-07-11 ENCOUNTER — Encounter
Admission: RE | Admit: 2023-07-11 | Discharge: 2023-07-11 | Disposition: A | Discharge status: Home / Self Care | Payer: Medicare HMO | Source: Ambulatory Visit | Attending: Urology | Admitting: Urology

## 2023-07-11 DIAGNOSIS — Z01812 Encounter for preprocedural laboratory examination: Secondary | ICD-10-CM

## 2023-07-11 DIAGNOSIS — R8281 Pyuria: Secondary | ICD-10-CM

## 2023-07-11 DIAGNOSIS — R350 Frequency of micturition: Secondary | ICD-10-CM

## 2023-07-11 DIAGNOSIS — R8290 Unspecified abnormal findings in urine: Secondary | ICD-10-CM

## 2023-07-11 HISTORY — DX: Pulmonary fibrosis, unspecified: J84.10

## 2023-07-11 HISTORY — DX: Dyspnea, unspecified: R06.00

## 2023-07-11 HISTORY — DX: Chronic kidney disease, stage 4 (severe): N18.4

## 2023-07-11 HISTORY — DX: Anemia in chronic kidney disease: D63.1

## 2023-07-11 HISTORY — DX: Pneumonia, unspecified organism: J18.9

## 2023-07-11 HISTORY — DX: Anesthesia of skin: R20.2

## 2023-07-11 HISTORY — DX: Other low back pain: M54.59

## 2023-07-11 HISTORY — DX: Acute respiratory failure with hypoxia: J96.01

## 2023-07-11 HISTORY — DX: Paresthesia of skin: R20.0

## 2023-07-11 SURGERY — ANTERIOR CERVICAL DECOMPRESSION/DISCECTOMY FUSION 4 LEVELS
Anesthesia: General

## 2023-07-11 NOTE — Patient Instructions (Addendum)
Your procedure is scheduled on: Monday August 5  Report to the Registration Desk on the 1st floor of the [facility] To find out your arrival time, please call [phone] between 1PM - 3PM on: Friday August 2  If your arrival time is 6:00 am, do not arrive before that time as the [facility] entrance doors do not open until 6:00 am.  REMEMBER: Instructions that are not followed completely may result in serious medical risk, up to and including death; or upon the discretion of your surgeon and anesthesiologist your surgery may need to be rescheduled.  Do not eat food after midnight the night before surgery.  No gum chewing or hard candies.  One week prior to surgery: Monday July 29  Stop Anti-inflammatories (NSAIDS) such as Advil, Aleve, Ibuprofen, Motrin, Naproxen, Naprosyn and Aspirin based products such as Excedrin, Goody's Powder, BC Powder. Stop ANY OVER THE COUNTER supplements until after surgery. calcitRIOL (ROCALTROL) last dose Monday July 29  Calcium Carb-Cholecalciferol last dose Monday July 29 ferrous sulfate last dose Monday July 29 Multiple Vitamins-Minerals (MULTIVITAMIN ADULT PO) last dose Monday July 29 vitamin B-12 (CYANOCOBALAMIN) last dose Monday July 29   You may however, continue to take Tylenol if needed for pain up until the day of surgery.  Continue taking all prescribed medications with the exception of the following: enalapril-hydrochlorothiazide (VASERETIC)  do not take the day of surgery furosemide (LASIX) do not take the day of surgery spironolactone (ALDACTONE) Do not take the day of surgery last dose Sunday August 4  Pirfenidone Last dose Monday July 29   TAKE ONLY THESE MEDICATIONS THE MORNING OF SURGERY WITH A SIP OF WATER:  amoxicillin-clavulanate (AUGMENTIN)  hydroxychloroquine (PLAQUENIL)  omeprazole (PRILOSEC)  (take one the night before and one on the morning of surgery - helps to prevent nausea after surgery.) pramipexole  (MIRAPEX) Venlafaxine HC    No Alcohol for 24 hours before or after surgery.  No Smoking including e-cigarettes for 24 hours before surgery.  No chewable tobacco products for at least 6 hours before surgery.  No nicotine patches on the day of surgery.  Do not use any "recreational" drugs for at least a week (preferably 2 weeks) before your surgery.  Please be advised that the combination of cocaine and anesthesia may have negative outcomes, up to and including death. If you test positive for cocaine, your surgery will be cancelled.  On the morning of surgery brush your teeth with toothpaste and water, you may rinse your mouth with mouthwash if you wish. Do not swallow any toothpaste or mouthwash.  Do not wear jewelry, make-up, hairpins, clips or nail polish.  Do not wear lotions, powders, or perfumes.   Do not shave body hair from the neck down 48 hours before surgery.  Do not bring valuables to the hospital. [facility] is not responsible for any missing/lost belongings or valuables.    Notify your doctor if there is any change in your medical condition (cold, fever, infection).  Wear comfortable clothing (specific to your surgery type) to the hospital.  After surgery, you can help prevent lung complications by doing breathing exercises.  Take deep breaths and cough every 1-2 hours.   If you are being discharged the day of surgery, you will not be allowed to drive home. You will need a responsible individual to drive you home and stay with you for 24 hours after surgery.   If you are taking public transportation, you will need to have a responsible  individual with you.  Please call the Pre-admissions Testing Dept. at [phone] if you have any questions about these instructions.  Surgery Visitation Policy:  Patients having surgery or a procedure may have two visitors.  Children under the age of 41 must have an adult with them who is not the patient.

## 2023-07-12 ENCOUNTER — Encounter: Payer: Self-pay | Admitting: Urology

## 2023-07-12 DIAGNOSIS — M0609 Rheumatoid arthritis without rheumatoid factor, multiple sites: Secondary | ICD-10-CM | POA: Diagnosis not present

## 2023-07-12 DIAGNOSIS — Z7960 Long term (current) use of unspecified immunomodulators and immunosuppressants: Secondary | ICD-10-CM | POA: Diagnosis not present

## 2023-07-12 NOTE — Progress Notes (Incomplete)
Perioperative / Anesthesia Services  Pre-Admission Testing Clinical Review / Preoperative Anesthesia Consult  Date: 07/13/23  Patient Demographics:  Name: Tonya Cummings DOB:   Sep 27, 1951 MRN:   [id number]  Planned Surgical Procedure(s):    Case: [id number] Date/Time: 07/18/23 1531   Procedures:      CYSTOSCOPY WITH RETROGRADE PYELOGRAM (Right)     CYSTOSCOPY WITH RENAL BIOPSY (Right)     CYSTOSCOPY WITH STENT PLACEMENT (Right)   Anesthesia type: General   Pre-op diagnosis: Right Renal Mass   Location: [facility] OR ROOM 10 / [facility]   Surgeons: [name], MD     NOTE: Available PAT nursing documentation and vital signs have been reviewed. Clinical nursing staff has updated patient's PMH/PSHx, current medication list, and drug allergies/intolerances to ensure comprehensive history available to assist in medical decision making as it pertains to the aforementioned surgical procedure and anticipated anesthetic course. Extensive review of available clinical information personally performed. [place] PMH and PSHx updated with any diagnoses/procedures that  may have been inadvertently omitted during her intake with the pre-admission testing department's nursing staff.  Clinical Discussion:  Tonya Cummings is a 72 y.o. female who is submitted for pre-surgical anesthesia review and clearance prior to her undergoing the above procedure. Patient has never been a smoker. Pertinent PMH includes: CAD, CHF, aortic atherosclerosis, HTN, HLD, ILD, pulmonary fibrosis, OSAH (no nocturnal PAP therapy), CKD-IV, GERD (on daily PPI), large hiatal hernia, anemia of chronic renal disease, RIGHT renal mass, RA, OA, osteoporosis, chronic thoracic compression fractures, RLS, lumbar DDD with associated chronic radicular lower back pain, anxiety, depression, insomnia.  Patient is followed by cardiology Mariah Milling, MD). She was last seen in the cardiology clinic on 05/10/2023; notes  reviewed. At the time of her clinic visit, patient doing well overall from a cardiovascular perspective.  Patient with chronic shortness of breath related to her ILD/pulmonary fibrosis diagnosis.  She is under the care of pulmonary medicine Karna Christmas, MD) at this time and is being treated with antifibrotic therapy (pirfenidone). Patient denied any chest pain, significant increase in shortness of breath, PND, orthopnea, palpitations, significant peripheral edema, weakness, fatigue, vertiginous symptoms, or presyncope/syncope.  Patient's main complaint in the clinic were pain in her neck with associated radiculopathy.  Patient being seen by neurosurgery and scheduled for cervical spine procedure  in the near future.  Patient with a past medical history significant for cardiovascular diagnoses. Documented physical exam was grossly benign, providing no evidence of acute exacerbation and/or decompensation of the patient's known cardiovascular conditions.  Most recent TTE was performed on 01/31/2021 revealing normal left ventricular systolic function with an EF of 55 to 60%.  There were no regional wall motion abnormalities. Left ventricular diastolic Doppler parameters consistent with abnormal relaxation (G1DD).  Right ventricular size and function normal.  Was mild mitral valve regurgitation.  All transvalvular gradients were noted to be normal providing no evidence suggestive of valvular stenosis.  Aorta normal in size with no evidence of aneurysmal dilatation.  Blood pressure well controlled at 124/66 mmHg on currently prescribed ACEi (enalapril) and diuretic (HCTZ + furosemide + spironolactone) therapies.  Patient is on rosuvastatin for her HLD diagnosis and ASCVD prevention.  Patient is not diabetic.  She does have an OSAH diagnosis.  Pulmonology reviewed PSG and felt that patient's OSAH was positional and could be mitigated by diet/exercise and weight loss.  Pulmonology noted that nocturnal PAP therapy was a  option if patient experiencing significant nocturnal hypoxia/shortness of breath. Functional capacity  is somewhat limited by  patient's age and multiple medical comorbidities.  With that said, patient is able to complete all of her ADL/IADLs without cardiovascular limitation.  Per the DASI, patient is able to achieve at least 4 METS of physical activity without experiencing any significant degree of angina/anginal equivalent symptoms. No changes were made to her medication regimen during her visit with cardiology.  Patient scheduled to follow-up with outpatient cardiology in 12 months or sooner if needed.  JERNELL MCGLYNN underwent CT imaging of the abdomen and pelvis on 05/28/2023 revealing a 5.7 cm mass in the medial aspect of the RIGHT kidney.  Subsequent MRI imaging performed on 06/09/2023 revealed a heterogeneously enhancing mass consistent with neoplasm favoring central renal cell carcinoma versus urothelial carcinoma. There was no renal vein involvement or abdominal lymphadenopathy noted.  Patient scheduled for consultation with urology regarding definitive management of noted renal mass.  Following consultation, patient has been scheduled for a CYSTOSCOPY WITH RETROGRADE PYELOGRAM; CYSTOSCOPY WITH RENAL BIOPSY; CYSTOSCOPY WITH STENT PLACEMENT on 07/18/2023 with Dr. [name], MD.  Given patient's past medical history significant for cardiovascular and cardiopulmonary diagnoses, presurgical clearances were sought by the PAT team.  Specialty clearances were obtained as follows.  Per pulmonary medicine Meredeth Ide, MD), "this patient is optimized for surgery and may proceed with the planned procedural course with a MODERATE risk of significant perioperative cardiopulmonary complications".  Per cardiology Cathe Mons, NP-C), "Ms. Litzenberger's perioperative risk of a major cardiac event is 6.6% according to the Revised Cardiac Risk Index (RCRI).  Therefore, she is at high risk for perioperative  complications.   Her functional capacity is poor at 4.64 METs according to the [place] Activity Status Index (DASI). According to ACC/AHA guidelines, no further cardiovascular testing needed.  The patient may proceed to surgery at ACCEPTABLE risk".  In review of her medication reconciliation, the patient is not noted to be taking any type of anticoagulation or antiplatelet therapies that would need to be held during her perioperative course.  Patient denies previous perioperative complications with anesthesia in the past. In review of the available records, it is noted that patient underwent a neuraxial + general anesthetic course here at [facility] ([facility] in 10/2020 without documented complications.      07/11/2023    4:24 PM 07/01/2023    2:15 PM 06/17/2023    1:12 PM  Vitals with BMI  Height 5\' 3"  5\' 3"    Weight 164 lbs 165 lbs 164 lbs 8 oz  BMI 29.06 29.24 29.15  Systolic  125 127  Diastolic  64 56  Pulse  93 91    Providers/Specialists:   NOTE: Primary physician provider listed below. Patient may have been seen by APP or partner within same practice.   PROVIDER ROLE / SPECIALTY LAST Jaquelyn Bitter, MD Urology (Surgeon) 07/01/2023  Gauger, Hermenia Fiscal, NP Primary Care Provider 07/01/2023  Julien Nordmann, MD Cardiology 05/10/2023  Vida Rigger, MD Pulmonary Medicine 04/07/2023  Owens Shark, MD Hematology 06/17/2023  Mady Haagensen, MD Nephrology 07/04/2023  Wendall Mola, MD Endocrinology 03/22/2023   Allergies:  Gabapentin and Ibuprofen  Current Home Medications:   No current facility-administered medications for this encounter.    Acetaminophen-Caffeine (EXCEDRIN TENSION HEADACHE) 500-65 MG TABS   amoxicillin-clavulanate (AUGMENTIN) 875-125 MG tablet   calcitRIOL (ROCALTROL) 0.25 MCG capsule   Calcium Carb-Cholecalciferol 600-400 MG-UNIT CAPS   Cholecalciferol 50 MCG (2000 UT) CAPS   cyclobenzaprine (FLEXERIL) 10 MG tablet    enalapril-hydrochlorothiazide (VASERETIC)  10-25 MG tablet   ferrous sulfate 325 (65 FE) MG tablet   furosemide (LASIX) 40 MG tablet   hydroxychloroquine (PLAQUENIL) 200 MG tablet   mirtazapine (REMERON) 30 MG tablet   Multiple Vitamins-Minerals (MULTIVITAMIN ADULT PO)   omeprazole (PRILOSEC) 40 MG capsule   Pirfenidone 267 MG TABS   pramipexole (MIRAPEX) 0.125 MG tablet   PROLIA 60 MG/ML SOSY injection   rosuvastatin (CRESTOR) 5 MG tablet   spironolactone (ALDACTONE) 25 MG tablet   Venlafaxine HCl 75 MG TB24   venlafaxine XR (EFFEXOR-XR) 150 MG 24 hr capsule   vitamin B-12 (CYANOCOBALAMIN) 500 MCG tablet   History:   Past Medical History:  Diagnosis Date   Acute hypoxemic respiratory failure (HCC)    Anemia in stage 4 chronic kidney disease (HCC)    Anxiety    Aortic atherosclerosis (HCC)    CHF (congestive heart failure) (HCC)    a.) TTE 09/30/2021: EF 55-60%, no RWMAs, mild MR, G1DD   Chronic pain    Chronic radicular pain of lower back    CKD (chronic kidney disease), stage IV (HCC)    Coronary artery calcification seen on CT scan    Costochondritis    DDD (degenerative disc disease), lumbar    Depression    Dyspnea    GERD (gastroesophageal reflux disease)    Hiatal hernia    Hip dysplasia    Hyperlipidemia    Hypertension    Insomnia    Iron deficiency    Iron deficiency anemia    Low back pain    Low vitamin B12 level    Lumbar facet joint pain    Migraines    Nose colonized with MRSA 10/03/2020   a.) preop PCR (+) 10/03/2020 prior to RIGHT TKA   Numbness and tingling of right leg    OA (osteoarthritis)    Obesity    OSA (obstructive sleep apnea)    a.) not currently utilizing nocturnal PAP therapy; positional. PCCM feels as if symptom can be mitigated with diet/exercise/weight loss unless develops significant symptoms.   Osteoporosis    a.) recieves denosumab injections   Pelvic fracture (HCC)    Pneumonia    Pulmonary fibrosis (HCC)    Raynaud's  disease without gangrene    Restless leg syndrome    a.) on pramipexole + oral Fe supplementation   Rheumatoid arthritis (HCC)    a.) Tx'd with hydroxychloroquine   Right renal mass 05/28/2023   a.) CT renal 05/28/2023:  5.7 cm mass in the medial aspect of right kidney   Seasonal allergies    Thoracic compression fracture [facility])    Past Surgical History:  Procedure Laterality Date   ABDOMINAL SURGERY     pt denies   APPENDECTOMY     CARPAL TUNNEL RELEASE Bilateral    CHOLECYSTECTOMY     COLONOSCOPY WITH PROPOFOL N/A 08/15/2020   Procedure: COLONOSCOPY WITH PROPOFOL;  Surgeon: Earline Mayotte, MD;  Location: [facility] ENDOSCOPY;  Service: Endoscopy;  Laterality: N/A;   DILATION AND CURETTAGE OF UTERUS     ESOPHAGOGASTRODUODENOSCOPY (EGD) WITH PROPOFOL N/A 08/15/2020   Procedure: ESOPHAGOGASTRODUODENOSCOPY (EGD) WITH PROPOFOL;  Surgeon: Earline Mayotte, MD;  Location: [facility] ENDOSCOPY;  Service: Endoscopy;  Laterality: N/A;   FRACTURE SURGERY     hip fracture    FRACTURE SURGERY     pelvic fracture plate    HIP SURGERY Left    KNEE ARTHROPLASTY Right 10/13/2020   Procedure: COMPUTER ASSISTED TOTAL KNEE ARTHROPLASTY - RNFA;  Surgeon:  Hooten, Illene Labrador, MD;  Location: [facility] ORS;  Service: Orthopedics;  Laterality: Right;   TUBAL LIGATION     Family History  Problem Relation Age of Onset   Diabetes Mother    Hypertension Mother    Aneurysm Father    Diabetes Son    Seizures Son    Osteosarcoma Brother    Cancer Brother    Diabetes Brother    Diabetes Maternal Grandfather    Heart disease Maternal Grandfather    Social History   Tobacco Use   Smoking status: Never   Smokeless tobacco: Never  Vaping Use   Vaping status: Never Used  Substance Use Topics   Alcohol use: No   Drug use: No    Pertinent Clinical Results:   Lab Results  Component Value Date   WBC 8.6 06/17/2023   HGB 8.7 (L) 06/17/2023   HCT 27.3 (L) 06/17/2023   MCV 92.2 06/17/2023   PLT 394 06/17/2023    Lab Results  Component Value Date   NA 134 (L) 05/28/2023   K 4.4 05/28/2023   CO2 23 05/28/2023   GLUCOSE 95 05/28/2023   BUN 67 (H) 05/28/2023   CREATININE 2.75 (H) 05/28/2023   CALCIUM 7.5 (L) 05/28/2023   GFRNONAA 18 (L) 05/28/2023   Component Date Value Ref Range Status   Color, Urine 07/01/2023 YELLOW  YELLOW Final   APPearance 07/01/2023 CLEAR  CLEAR Final   Specific Gravity, Urine 07/01/2023 1.010  1.005 - 1.030 Final   pH 07/01/2023 5.0  5.0 - 8.0 Final   Glucose, UA 07/01/2023 NEGATIVE  NEGATIVE mg/dL Final   Hgb urine dipstick 07/01/2023 TRACE (A)  NEGATIVE Final   Bilirubin Urine 07/01/2023 NEGATIVE  NEGATIVE Final   Ketones, ur 07/01/2023 NEGATIVE  NEGATIVE mg/dL Final   Protein, ur 89/38/1017 NEGATIVE  NEGATIVE mg/dL Final   Nitrite 51/01/5851 NEGATIVE  NEGATIVE Final   Leukocytes,Ua 07/01/2023 MODERATE (A)  NEGATIVE Final   Squamous Epithelial / HPF 07/01/2023 0-5  0 - 5 /HPF Final   WBC, UA 07/01/2023 11-20  0 - 5 WBC/hpf Final   RBC / HPF 07/01/2023 6-10  0 - 5 RBC/hpf Final   Bacteria, UA 07/01/2023 FEW (A)  NONE SEEN Final   WBC Clumps 07/01/2023 PRESENT   Final   Budding Yeast 07/01/2023 PRESENT   Final   Performed at [facility], [address], [place], [place] [address]   Specimen Description 07/01/2023    Final                   Value:URINE, RANDOM Performed at [facility], [address] [place], [place] [address]    Special Requests 07/01/2023    Final                   Value:NONE Performed at [facility], [address] [place], [place] [address]    Culture 07/01/2023  (A)   Final                   Value:40,000 COLONIES/mL DIPHTHEROIDS(CORYNEBACTERIUM SPECIES) 50,000 COLONIES/mL GARDNERELLA VAGINALIS Standardized susceptibility testing for this organism is not available. Performed at [facility], [address], [place], [place] [address]    Report Status 07/01/2023 07/03/2023 FINAL   Final     ECG: Date: 05/28/2023 Time ECG obtained: 1148 AM Rate: 89 bpm Rhythm: normal sinus Axis (leads I and aVF): Normal Intervals: PR 162 ms. QRS 94 ms.  QTc 452 ms. ST segment and T wave changes: Inferolateral ST and T wave abnormalities    IMAGING / PROCEDURES: PULMONARY FUNCTION TESTING performed on 04/07/2023 04/07/2023- FEV1 - 1.52L- 71%  08/03/2022- FEV1- 1.16L - 51% 03/18/2022- FEV1 - 1.19L - 55% 10/26/2021- FEV1- 0.88L- 39%  MR SHOULDER RIGHT WO CONTRAST performed on 07/07/2023 Motion artifact markedly limits evaluation, however a massive full-thickness tear of the majority of the supraspinatus and infraspinatus tendon insertions is favored. High-riding humeral head. High-grade supraspinatus and moderate anterior greater than posterior infraspinatus muscle atrophy and fatty infiltration. Mild degenerative changes of the acromioclavicular joint. Type III acromion, with mild downsloping of the anterolateral acromion. Mild glenohumeral joint effusion extending into the subacromial/subdeltoid bursa.  MR ABDOMEN W WO CONTRAST performed on 06/09/2023 Mildly motion degraded exam. Central interpolar right renal heterogeneously enhancing mass is consistent with neoplasm. Favor central renal cell carcinoma over urothelial carcinoma. No renal vein involvement or abdominal adenopathy. Subtle T2 hyperintense, arterially hyperenhancing liver lesions are indeterminate, especially given small size and motion. These most likely represent hemangiomas. Early metastasis could look similar. Consider pre and post contrast abdominal MRI follow-up at 3-6 months Right-sided approximately T12 focus of hyperenhancement is favored to be degenerative. Recommend attention on follow-up to exclude isolated osseous metastasis. Moderate hiatal hernia  Aortic atherosclerosis  Thoracolumbar compression deformities.  CT RENAL STONE STUDY performed on 05/28/2023 There is 5.7 cm mass in the medial aspect of right  kidney. Evaluation is limited without intravenous contrast. Possibility of malignant neoplasm in right kidney is not excluded. Follow-up contrast enhanced CT, PET-CT and tissue sampling as warranted should be considered. There is no hydronephrosis. Possible 1.2 cm cyst in the lower pole of right kidney. There is focal alveolar infiltrate in the posteromedial left lower lung fields suggesting pneumonia in left lower lobe. There are scattered patchy faint ground-glass densities in the lower lung fields suggesting scarring or interstitial pneumonia. There are multiple noncalcified nodules in both lower lung fields measuring up to 8 mm in size. Similar findings were seen in previous CT chest suggesting possible granulomas. There is no evidence of intestinal obstruction or pneumoperitoneum. Moderate to large fixed hiatal hernia. There is decrease in height of bodies of T11 and L1 vertebrae with no significant interval change suggesting old compression fractures. Degenerative changes are noted in lumbar spine. Arteriosclerosis.   Coronary artery disease.  TRANSTHORACIC ECHOCARDIOGRAM performed on 09/30/2021 Left ventricular ejection fraction, by estimation, is 55 to 60%. The  left ventricle has normal function. The left ventricle has no regional  wall motion abnormalities. Left ventricular diastolic parameters are  consistent with Grade I diastolic  dysfunction (impaired relaxation).  Right ventricular systolic function is normal. The right ventricular size is normal.  The mitral valve is normal in structure. Mild mitral valve regurgitation.  The aortic valve is normal in structure. Aortic valve regurgitation is not visualized.   Impression and Plan:  Tonya Cummings has been referred for pre-anesthesia review and clearance prior to her undergoing the planned anesthetic and procedural courses. Available labs, pertinent testing, and imaging results were personally reviewed by me in preparation for  upcoming operative/procedural course. [facility] medical record has been updated following extensive record review and patient interview with PAT staff.   This patient has been appropriately cleared by cardiology (ACCEPTABLE) and by pulmonary medicine (MODERATE) with the individually indicated risk of experiencing significant perioperative cardiovascular complications. Based on clinical review performed today (07/13/23), barring any significant acute changes in the patient's overall condition, it is  anticipated that she will be able to proceed with the planned surgical intervention. Any acute changes in clinical condition may necessitate her procedure being postponed and/or cancelled. Patient will meet with anesthesia team (MD and/or CRNA) on the day of her procedure for preoperative evaluation/assessment. Questions regarding anesthetic course will be fielded at that time.   Pre-surgical instructions were reviewed with the patient during her PAT appointment, and questions were fielded to satisfaction by PAT clinical staff. She has been instructed on which medications that she will need to hold prior to surgery, as well as the ones that have been deemed safe/appropriate to take on the day of her procedure. As part of the general education provided by PAT, patient made aware both verbally and in writing, that she would need to abstain from the use of any illegal substances during her perioperative course.  She was advised that failure to follow the provided instructions could necessitate case cancellation or result in serious perioperative complications up to and including death. Patient encouraged to contact PAT and/or her surgeon's office to discuss any questions or concerns that may arise prior to surgery; verbalized understanding.   Quentin Mulling, MSN, APRN, FNP-C, CEN [facility]  Peri-operative Services Nurse Practitioner Phone: [phone] Fax: [phone] 07/13/23 11:38  AM  NOTE: This note has been prepared using Dragon dictation software. Despite my best ability to proofread, there is always the potential that unintentional transcriptional errors may still occur from this process.

## 2023-07-13 ENCOUNTER — Encounter: Payer: Self-pay | Admitting: Urology

## 2023-07-13 ENCOUNTER — Inpatient Hospital Stay: Admission: RE | Admit: 2023-07-13 | Payer: Medicare HMO | Source: Home / Self Care | Admitting: Neurosurgery

## 2023-07-13 ENCOUNTER — Telehealth: Payer: Self-pay | Admitting: Neurosurgery

## 2023-07-13 DIAGNOSIS — E785 Hyperlipidemia, unspecified: Secondary | ICD-10-CM

## 2023-07-13 DIAGNOSIS — I5031 Acute diastolic (congestive) heart failure: Secondary | ICD-10-CM

## 2023-07-13 DIAGNOSIS — D509 Iron deficiency anemia, unspecified: Secondary | ICD-10-CM

## 2023-07-13 DIAGNOSIS — I10 Essential (primary) hypertension: Secondary | ICD-10-CM

## 2023-07-13 DIAGNOSIS — N1832 Chronic kidney disease, stage 3b: Secondary | ICD-10-CM

## 2023-07-13 DIAGNOSIS — D631 Anemia in chronic kidney disease: Secondary | ICD-10-CM

## 2023-07-13 DIAGNOSIS — D649 Anemia, unspecified: Secondary | ICD-10-CM

## 2023-07-13 DIAGNOSIS — Z01818 Encounter for other preprocedural examination: Secondary | ICD-10-CM

## 2023-07-13 SURGERY — POSTERIOR CERVICAL FUSION/FORAMINOTOMY LEVEL 5
Anesthesia: General

## 2023-07-13 NOTE — Telephone Encounter (Signed)
noted 

## 2023-07-13 NOTE — Telephone Encounter (Signed)
UN Nurse from [place] called inquiring about the surgery scheduled for 7/29-7/30. Advised that the surgery was canceled due to an unforseen finding during her her pre-op appt. Nurse stated that the auth for surgery is still effective until 08/10/2023. If pt would like to/is able to resch it would need to be before this date otherwise a new auth will need to be completed.

## 2023-07-14 ENCOUNTER — Encounter: Payer: Self-pay | Admitting: Nephrology

## 2023-07-15 ENCOUNTER — Ambulatory Visit
Admission: RE | Admit: 2023-07-15 | Discharge: 2023-07-15 | Disposition: A | Discharge status: Home / Self Care | Payer: Medicare HMO | Source: Ambulatory Visit | Attending: Urology | Admitting: Urology

## 2023-07-15 DIAGNOSIS — I130 Hypertensive heart and chronic kidney disease with heart failure and stage 1 through stage 4 chronic kidney disease, or unspecified chronic kidney disease: Secondary | ICD-10-CM | POA: Diagnosis not present

## 2023-07-15 DIAGNOSIS — R918 Other nonspecific abnormal finding of lung field: Secondary | ICD-10-CM | POA: Insufficient documentation

## 2023-07-15 DIAGNOSIS — N2889 Other specified disorders of kidney and ureter: Secondary | ICD-10-CM | POA: Insufficient documentation

## 2023-07-15 DIAGNOSIS — K449 Diaphragmatic hernia without obstruction or gangrene: Secondary | ICD-10-CM | POA: Diagnosis not present

## 2023-07-17 MED ORDER — CEFAZOLIN SODIUM-DEXTROSE 2-4 GM/100ML-% IV SOLN
2.0000 g | INTRAVENOUS | Status: AC
  Administered 2023-07-18: 2 g via INTRAVENOUS

## 2023-07-17 MED ORDER — LACTATED RINGERS IV SOLN: INTRAVENOUS | Status: DC

## 2023-07-17 MED ORDER — ORAL CARE MOUTH RINSE: 15.0000 mL | Freq: Once | OROMUCOSAL | Status: AC

## 2023-07-17 MED ORDER — CHLORHEXIDINE GLUCONATE 0.12 % MT SOLN
15.0000 mL | Freq: Once | OROMUCOSAL | Status: AC
  Administered 2023-07-18: 15 mL via OROMUCOSAL

## 2023-07-18 ENCOUNTER — Ambulatory Visit: Payer: Medicare HMO | Admitting: Urgent Care

## 2023-07-18 ENCOUNTER — Encounter: Payer: Self-pay | Admitting: Urology

## 2023-07-18 ENCOUNTER — Ambulatory Visit
Admission: RE | Admit: 2023-07-18 | Discharge: 2023-07-18 | Disposition: A | Discharge status: Home / Self Care | Payer: Medicare HMO | Attending: Urology | Admitting: Urology

## 2023-07-18 ENCOUNTER — Encounter
Admission: RE | Disposition: A | Discharge status: Home / Self Care | Payer: Self-pay | Source: Home / Self Care | Attending: Urology

## 2023-07-18 ENCOUNTER — Other Ambulatory Visit: Payer: Self-pay

## 2023-07-18 ENCOUNTER — Ambulatory Visit: Payer: Medicare HMO

## 2023-07-18 DIAGNOSIS — I509 Heart failure, unspecified: Secondary | ICD-10-CM | POA: Insufficient documentation

## 2023-07-18 DIAGNOSIS — Z6829 Body mass index (BMI) 29.0-29.9, adult: Secondary | ICD-10-CM | POA: Insufficient documentation

## 2023-07-18 DIAGNOSIS — G8929 Other chronic pain: Secondary | ICD-10-CM | POA: Diagnosis not present

## 2023-07-18 DIAGNOSIS — D631 Anemia in chronic kidney disease: Secondary | ICD-10-CM | POA: Diagnosis not present

## 2023-07-18 DIAGNOSIS — N184 Chronic kidney disease, stage 4 (severe): Secondary | ICD-10-CM | POA: Insufficient documentation

## 2023-07-18 DIAGNOSIS — D649 Anemia, unspecified: Secondary | ICD-10-CM | POA: Insufficient documentation

## 2023-07-18 DIAGNOSIS — M1990 Unspecified osteoarthritis, unspecified site: Secondary | ICD-10-CM | POA: Diagnosis not present

## 2023-07-18 DIAGNOSIS — I130 Hypertensive heart and chronic kidney disease with heart failure and stage 1 through stage 4 chronic kidney disease, or unspecified chronic kidney disease: Secondary | ICD-10-CM | POA: Diagnosis not present

## 2023-07-18 DIAGNOSIS — I7300 Raynaud's syndrome without gangrene: Secondary | ICD-10-CM | POA: Diagnosis not present

## 2023-07-18 DIAGNOSIS — N2889 Other specified disorders of kidney and ureter: Secondary | ICD-10-CM | POA: Insufficient documentation

## 2023-07-18 DIAGNOSIS — M069 Rheumatoid arthritis, unspecified: Secondary | ICD-10-CM | POA: Insufficient documentation

## 2023-07-18 DIAGNOSIS — K449 Diaphragmatic hernia without obstruction or gangrene: Secondary | ICD-10-CM | POA: Insufficient documentation

## 2023-07-18 DIAGNOSIS — M542 Cervicalgia: Secondary | ICD-10-CM | POA: Diagnosis not present

## 2023-07-18 DIAGNOSIS — G2581 Restless legs syndrome: Secondary | ICD-10-CM | POA: Insufficient documentation

## 2023-07-18 DIAGNOSIS — J8410 Pulmonary fibrosis, unspecified: Secondary | ICD-10-CM | POA: Insufficient documentation

## 2023-07-18 DIAGNOSIS — G4733 Obstructive sleep apnea (adult) (pediatric): Secondary | ICD-10-CM | POA: Diagnosis not present

## 2023-07-18 DIAGNOSIS — I2510 Atherosclerotic heart disease of native coronary artery without angina pectoris: Secondary | ICD-10-CM | POA: Insufficient documentation

## 2023-07-18 DIAGNOSIS — M5136 Other intervertebral disc degeneration, lumbar region: Secondary | ICD-10-CM | POA: Insufficient documentation

## 2023-07-18 DIAGNOSIS — K219 Gastro-esophageal reflux disease without esophagitis: Secondary | ICD-10-CM | POA: Insufficient documentation

## 2023-07-18 DIAGNOSIS — I5032 Chronic diastolic (congestive) heart failure: Secondary | ICD-10-CM | POA: Diagnosis not present

## 2023-07-18 DIAGNOSIS — Z9049 Acquired absence of other specified parts of digestive tract: Secondary | ICD-10-CM | POA: Insufficient documentation

## 2023-07-18 DIAGNOSIS — Z9851 Tubal ligation status: Secondary | ICD-10-CM | POA: Insufficient documentation

## 2023-07-18 DIAGNOSIS — M810 Age-related osteoporosis without current pathological fracture: Secondary | ICD-10-CM | POA: Insufficient documentation

## 2023-07-18 DIAGNOSIS — Z79899 Other long term (current) drug therapy: Secondary | ICD-10-CM | POA: Insufficient documentation

## 2023-07-18 DIAGNOSIS — E785 Hyperlipidemia, unspecified: Secondary | ICD-10-CM | POA: Diagnosis not present

## 2023-07-18 DIAGNOSIS — F419 Anxiety disorder, unspecified: Secondary | ICD-10-CM | POA: Insufficient documentation

## 2023-07-18 DIAGNOSIS — I129 Hypertensive chronic kidney disease with stage 1 through stage 4 chronic kidney disease, or unspecified chronic kidney disease: Secondary | ICD-10-CM | POA: Diagnosis not present

## 2023-07-18 DIAGNOSIS — F32A Depression, unspecified: Secondary | ICD-10-CM | POA: Insufficient documentation

## 2023-07-18 HISTORY — DX: Chronic kidney disease, stage 4 (severe): N18.4

## 2023-07-18 HISTORY — DX: Atherosclerotic heart disease of native coronary artery without angina pectoris: I25.10

## 2023-07-18 HISTORY — PX: CYSTOSCOPY W/ RETROGRADES: SHX1426

## 2023-07-18 HISTORY — DX: Atherosclerosis of aorta: I70.0

## 2023-07-18 HISTORY — DX: Unspecified osteoarthritis, unspecified site: M19.90

## 2023-07-18 HISTORY — DX: Iron deficiency: E61.1

## 2023-07-18 HISTORY — DX: Other intervertebral disc degeneration, lumbar region: M51.36

## 2023-07-18 HISTORY — DX: Other intervertebral disc degeneration, lumbar region without mention of lumbar back pain or lower extremity pain: M51.369

## 2023-07-18 HISTORY — PX: URETEROSCOPY: SHX842

## 2023-07-18 HISTORY — DX: Insomnia, unspecified: G47.00

## 2023-07-18 HISTORY — DX: Obstructive sleep apnea (adult) (pediatric): G47.33

## 2023-07-18 HISTORY — DX: Diaphragmatic hernia without obstruction or gangrene: K44.9

## 2023-07-18 SURGERY — CYSTOSCOPY, WITH RETROGRADE PYELOGRAM
Anesthesia: General | Site: Ureter | Laterality: Right

## 2023-07-18 MED ORDER — DEXAMETHASONE SODIUM PHOSPHATE 10 MG/ML IJ SOLN
INTRAMUSCULAR | Status: AC
  Filled 2023-07-18: qty 1

## 2023-07-18 MED ORDER — ACETAMINOPHEN 10 MG/ML IV SOLN: 1000.0000 mg | Freq: Once | INTRAVENOUS | Status: DC | PRN

## 2023-07-18 MED ORDER — FENTANYL CITRATE (PF) 100 MCG/2ML IJ SOLN
INTRAMUSCULAR | Status: AC
  Filled 2023-07-18: qty 2

## 2023-07-18 MED ORDER — ROCURONIUM BROMIDE 10 MG/ML (PF) SYRINGE
PREFILLED_SYRINGE | INTRAVENOUS | Status: AC
  Filled 2023-07-18: qty 10

## 2023-07-18 MED ORDER — KETAMINE HCL 50 MG/5ML IJ SOSY
PREFILLED_SYRINGE | INTRAMUSCULAR | Status: AC
  Filled 2023-07-18: qty 5

## 2023-07-18 MED ORDER — ONDANSETRON HCL 4 MG/2ML IJ SOLN
INTRAMUSCULAR | Status: DC | PRN
  Administered 2023-07-18: 4 mg via INTRAVENOUS

## 2023-07-18 MED ORDER — FENTANYL CITRATE (PF) 100 MCG/2ML IJ SOLN
INTRAMUSCULAR | Status: DC | PRN
  Administered 2023-07-18 (×2): 25 ug via INTRAVENOUS

## 2023-07-18 MED ORDER — ONDANSETRON HCL 4 MG/2ML IJ SOLN
INTRAMUSCULAR | Status: AC
  Filled 2023-07-18: qty 2

## 2023-07-18 MED ORDER — PROPOFOL 10 MG/ML IV BOLUS
INTRAVENOUS | Status: DC | PRN
  Administered 2023-07-18: 20 mg via INTRAVENOUS
  Administered 2023-07-18: 30 mg via INTRAVENOUS
  Administered 2023-07-18: 80 mg via INTRAVENOUS

## 2023-07-18 MED ORDER — SODIUM CHLORIDE 0.9 % IR SOLN
Status: DC | PRN
  Administered 2023-07-18: 3000 mL

## 2023-07-18 MED ORDER — ONDANSETRON HCL 4 MG/2ML IJ SOLN: 4.0000 mg | Freq: Once | INTRAMUSCULAR | Status: DC | PRN

## 2023-07-18 MED ORDER — KETAMINE HCL 10 MG/ML IJ SOLN
INTRAMUSCULAR | Status: DC | PRN
  Administered 2023-07-18: 30 mg via INTRAVENOUS

## 2023-07-18 MED ORDER — PROPOFOL 10 MG/ML IV BOLUS
INTRAVENOUS | Status: AC
  Filled 2023-07-18: qty 20

## 2023-07-18 MED ORDER — CEFAZOLIN SODIUM-DEXTROSE 2-4 GM/100ML-% IV SOLN
INTRAVENOUS | Status: AC
  Filled 2023-07-18: qty 100

## 2023-07-18 MED ORDER — LACTATED RINGERS IV SOLN: INTRAVENOUS | Status: DC

## 2023-07-18 MED ORDER — STERILE WATER FOR IRRIGATION IR SOLN
Status: DC | PRN
Start: 2023-07-18 — End: 2023-07-18
  Administered 2023-07-18: 500 mL

## 2023-07-18 MED ORDER — OXYCODONE HCL 5 MG PO TABS: 5.0000 mg | ORAL_TABLET | Freq: Once | ORAL | Status: DC | PRN

## 2023-07-18 MED ORDER — OXYCODONE HCL 5 MG/5ML PO SOLN: 5.0000 mg | Freq: Once | ORAL | Status: DC | PRN

## 2023-07-18 MED ORDER — DEXAMETHASONE SODIUM PHOSPHATE 10 MG/ML IJ SOLN
INTRAMUSCULAR | Status: DC | PRN
  Administered 2023-07-18: 10 mg via INTRAVENOUS

## 2023-07-18 MED ORDER — LIDOCAINE HCL (PF) 2 % IJ SOLN
INTRAMUSCULAR | Status: AC
  Filled 2023-07-18: qty 5

## 2023-07-18 MED ORDER — FENTANYL CITRATE (PF) 100 MCG/2ML IJ SOLN
25.0000 ug | INTRAMUSCULAR | Status: DC | PRN
  Administered 2023-07-18 (×4): 25 ug via INTRAVENOUS

## 2023-07-18 MED ORDER — LIDOCAINE HCL (CARDIAC) PF 100 MG/5ML IV SOSY
PREFILLED_SYRINGE | INTRAVENOUS | Status: DC | PRN
  Administered 2023-07-18: 80 mg via INTRAVENOUS

## 2023-07-18 MED ORDER — PHENYLEPHRINE HCL (PRESSORS) 10 MG/ML IV SOLN
INTRAVENOUS | Status: DC | PRN
  Administered 2023-07-18: 160 ug via INTRAVENOUS

## 2023-07-18 MED ORDER — IOHEXOL 180 MG/ML  SOLN
INTRAMUSCULAR | Status: DC | PRN
  Administered 2023-07-18: 10 mL

## 2023-07-18 MED ORDER — CHLORHEXIDINE GLUCONATE 0.12 % MT SOLN
OROMUCOSAL | Status: AC
  Filled 2023-07-18: qty 15

## 2023-07-18 SURGICAL SUPPLY — 28 items
BAG DRAIN SIEMENS DORNER NS (MISCELLANEOUS) ×3 IMPLANT
BAG DRN NS LF (MISCELLANEOUS) ×3
BRUSH SCRUB EZ 1% IODOPHOR (MISCELLANEOUS) ×3 IMPLANT
BRUSH SCRUB EZ 4% CHG (MISCELLANEOUS) ×3 IMPLANT
CATH URETL OPEN 5X70 (CATHETERS) ×3 IMPLANT
DRAPE UTILITY 15X26 TOWEL STRL (DRAPES) ×3 IMPLANT
DRSG TELFA 3X4 N-ADH STERILE (GAUZE/BANDAGES/DRESSINGS) ×3 IMPLANT
ELECT REM PT RETURN 9FT ADLT (ELECTROSURGICAL) ×3
ELECTRODE REM PT RTRN 9FT ADLT (ELECTROSURGICAL) ×3 IMPLANT
GAUZE 4X4 16PLY ~~LOC~~+RFID DBL (SPONGE) ×6 IMPLANT
GLOVE BIO SURGEON STRL SZ 6.5 (GLOVE) ×3 IMPLANT
GOWN STRL REUS W/ TWL LRG LVL3 (GOWN DISPOSABLE) ×6 IMPLANT
GOWN STRL REUS W/TWL LRG LVL3 (GOWN DISPOSABLE) ×6
GUIDEWIRE GREEN .038 145CM (MISCELLANEOUS) ×1 IMPLANT
GUIDEWIRE STR DUAL SENSOR (WIRE) ×3 IMPLANT
IV NS IRRIG 3000ML ARTHROMATIC (IV SOLUTION) ×3 IMPLANT
KIT TURNOVER CYSTO (KITS) ×3 IMPLANT
NDL SAFETY ECLIP 18X1.5 (MISCELLANEOUS) ×3 IMPLANT
PACK CYSTO AR (MISCELLANEOUS) ×3 IMPLANT
SET CYSTO W/LG BORE CLAMP LF (SET/KITS/TRAYS/PACK) ×3 IMPLANT
STENT URET 6FRX24 CONTOUR (STENTS) IMPLANT
STENT URET 6FRX26 CONTOUR (STENTS) IMPLANT
SURGILUBE 2OZ TUBE FLIPTOP (MISCELLANEOUS) ×3 IMPLANT
SYR TOOMEY IRRIG 70ML (MISCELLANEOUS) ×3
SYRINGE TOOMEY IRRIG 70ML (MISCELLANEOUS) ×3 IMPLANT
WATER STERILE IRR 1000ML POUR (IV SOLUTION) ×3 IMPLANT
WATER STERILE IRR 3000ML UROMA (IV SOLUTION) ×3 IMPLANT
WATER STERILE IRR 500ML POUR (IV SOLUTION) ×3 IMPLANT

## 2023-07-18 NOTE — Anesthesia Procedure Notes (Signed)
Procedure Name: LMA Insertion Date/Time: 07/18/2023 12:36 PM  Performed by: Emeterio Reeve, CRNAPre-anesthesia Checklist: Patient identified, Emergency Drugs available, Suction available and Patient being monitored Patient Re-evaluated:Patient Re-evaluated prior to induction Oxygen Delivery Method: Circle system utilized Preoxygenation: Pre-oxygenation with 100% oxygen Induction Type: IV induction Ventilation: Mask ventilation without difficulty LMA: LMA inserted LMA Size: 4.0 Tube type: Oral Number of attempts: 1 Airway Equipment and Method: Oral airway Placement Confirmation: breath sounds checked- equal and bilateral, positive ETCO2 and CO2 detector Tube secured with: Tape Dental Injury: Teeth and Oropharynx as per pre-operative assessment  Comments: Pt positioned themselves on OR table due to minimal movement of neck. Pt position unchanged with induction and throughout procedure

## 2023-07-18 NOTE — Anesthesia Preprocedure Evaluation (Addendum)
Anesthesia Evaluation  Patient identified by MRN, date of birth, ID band Patient awake    Reviewed: Allergy & Precautions, NPO status , Patient's Chart, lab work & pertinent test results  History of Anesthesia Complications Negative for: history of anesthetic complications  Airway Mallampati: IV   Neck ROM: Full    Dental  (+) Missing, Chipped   Pulmonary shortness of breath (pulmonary fibrosis), sleep apnea  PULMONARY FUNCTION TESTING performed on 04/07/2023 1. 04/07/2023- FEV1 - 1.52L- 71%  2. 08/03/2022- FEV1- 1.16L - 51% 3. 03/18/2022- FEV1 - 1.19L - 55% 4. 10/26/2021- FEV1- 0.88L- 39%     + decreased breath sounds      Cardiovascular hypertension, Normal cardiovascular exam Rhythm:Regular Rate:Normal  ECG 05/28/23: NSR; LVH; ST and T abnormality  Echo 09/30/21:  1. Left ventricular ejection fraction, by estimation, is 55 to 60%. The  left ventricle has normal function. The left ventricle has no regional  wall motion abnormalities. Left ventricular diastolic parameters are  consistent with Grade I diastolic  dysfunction (impaired relaxation).  2. Right ventricular systolic function is normal. The right ventricular size is normal.  3. The mitral valve is normal in structure. Mild mitral valve regurgitation.  4. The aortic valve is normal in structure. Aortic valve regurgitation is not visualized.     Neuro/Psych  Headaches PSYCHIATRIC DISORDERS Anxiety Depression       GI/Hepatic ,GERD  ,,  Endo/Other  Obesity   Renal/GU Renal disease (stage IV CKD)     Musculoskeletal  (+) Arthritis ,  Raynauds   Abdominal   Peds  Hematology  (+) Blood dyscrasia, anemia   Anesthesia Other Findings Reviewed and agree with Tonya Cummings pre-anesthesia clinical review note.    Cardiology note 05/10/23:  1. Chronic diastolic congestive heart failure without symptoms today.  Last echocardiogram was completed in 2020 with an LVEF  of 55-60%, no regional wall motion abnormalities, G1 DD, mild mitral valve regurgitation.  Patient appears euvolemic on exam today.  She is continued on furosemide 60 mg daily and spironolactone 25 mg daily.   2. Essential hypertension with blood pressure today 120/66.  She is continued on furosemide and spironolactone.  Encouraged to continue to monitor pressure 1 to 2 hours post medication administration.   3. CKD stage IV with a last serum creatinine 2.92 in 2/24, prescription sent to be followed by nephrology.   4. Chronic back pain with severe neck pain with associated symptoms of numbness and tingling to the bilateral upper extremities.  She has upcoming surgery with neurosurgery.   5. Preoperative cardiovascular risk assessment completed.    Tonya Cummings perioperative risk of a major cardiac event is 6.6% according to the Revised Cardiac Risk Index (RCRI).  Therefore, she is at high risk for perioperative complications.   Her functional capacity is poor at 4.64 METs according to the Duke Activity Status Index (DASI). Recommendations: According to ACC/AHA guidelines, no further cardiovascular testing needed.  The patient may proceed to surgery at acceptable risk.     6.   Disposition patient return to clinic to see MD/APP in 1 year or sooner if needed with EKG on return.      Pulmonology note 04/07/23:  Dyspnea on exertion and shortness of breath at rest -Etiology is multifactorial-including chronic anemia, physical deconditioning, obstructive sleep apnea, heart and kidney failure, morbid obesity, inflammatory arthropathy with rheumatoid and Raynaud's phenomenon, metabolic syndrome, and likely GERD with hiatal hernia which may be contributing with laryngopharyngeal reflux. -Heart failure with preserved ejection  fraction- most recent transthoracic echo October 2022 with EF 55 to 60% with normal left ventricular function and no regional wall abnormalities. Additionally grade 1 diastolic  dysfunction was noted. -Chronic kidney disease stage 4-followed by [facility] kidney- Dr. Lourdes Sledge -in the context of rheumatoid arthritis and NSAID use with protein urea and MGUS. Continuing diuresis with Lasix chronically -Chronic anemia-followed by medical oncology-Dr. Rao-status post colonoscopy with a 5 mm polyp in the proximal ascending colon and diverticulosis of the sigmoid. EGD was normal B12 was normal.  Restless leg syndrome IRLS 16  - continue pramipexole - continue iron supplementation  Obstructive Sleep Apnea Screening  Snorring: -Y Excessive datytime sleepiness: -Y Gasping or Choking during Sleep: 0Y Morning Headaches -YES  Trouble concentrating : -YES  Depression: PHQ2 - POSITIVE FOR DEPRSSION  Restlessness during Sleep: IRLS 17  Epworth Score: 13 Neck circumference >16in F or >17in M: 17 Structural abnormalities: Retrogranthia   Obesity: MBI 38 Caffeine intake : n Alcohol intake : n Medications : -remeron, mirapex, prozac  Symptoms of insomnia, hypersomnia, parasomnias, circadian rhythm disorder: n- patient is good candidate for CPAP and is agreeable to wearing device. She would benefit from treatment of OSA.   CPAP candidate: claustrophobia, interface issues: -none   Face to face discussion regarding Sleep Study done with patient and patient is agreeable: -patient is agreeable   Rheumatoid arthritis - patient with this condition may develop restrictive lung disease with Rheumatoid nodules and pulmonary fibrosis in pattern consistent with UIP.  -CT chest high resolution for ILD and review of pulmonary fibrotic disease prior to initiation of Esbriet April 2023 CT chest  Lungs/Pleura: Asymmetric scarring and volume loss of the right lung  base with elevation of the right hemidiaphragm, featuring irregular  peripheral interstitial opacity and ground-glass, arcing subpleural  elements, and evidence of subpleural sparing (series 5, image 115).  There are  multiple small bilateral pulmonary nodules, the largest in  the right lower lobe measuring 0.7 cm (series 5, image 110). Mild,  lobular air trapping on expiratory phase imaging. No pleural  effusion or pneumothorax.   Progressive Pulmonary fibrosis - She does have rheumatoid arthritis and this is associated with UIP pattern of ILD as well as other subtypes -She wishes to start Antifibrotic - we have not been able to get approval by insurance  -RA associcated ILD and pulmonary fibrosis  Morbid obesity BMI >38 -Recommend dietary changes and bariatric evaluation -We discussed changes in diet - recommend reduction in PO intake with fluid restriction -she lost 40lbs with lifestyle modification and some of the loss was fluid.    Reproductive/Obstetrics                             Anesthesia Physical Anesthesia Plan  ASA: 4  Anesthesia Plan: General   Post-op Pain Management:    Induction: Intravenous  PONV Risk Score and Plan: 3 and Ondansetron, Dexamethasone and Treatment may vary due to age or medical condition  Airway Management Planned: Oral ETT  Additional Equipment:   Intra-op Plan:   Post-operative Plan: Extubation in OR  Informed Consent: I have reviewed the patients History and Physical, chart, labs and discussed the procedure including the risks, benefits and alternatives for the proposed anesthesia with the patient or authorized representative who has indicated his/her understanding and acceptance.     Dental advisory given  Plan Discussed with: CRNA  Anesthesia Plan Comments: (Patient consented for risks of anesthesia including but  not limited to:  - adverse reactions to medications - damage to eyes, teeth, lips or other oral mucosa - nerve damage due to positioning  - sore throat or hoarseness - damage to heart, brain, nerves, lungs, other parts of body or loss of life  Informed patient about role of CRNA in peri- and intra-operative  care.  Patient voiced understanding.)        Anesthesia Quick Evaluation

## 2023-07-18 NOTE — Discharge Instructions (Addendum)
Please keep your appointment scheduled for next week.  Will discuss nephrectomy in more detail at that time.  AMBULATORY SURGERY  DISCHARGE INSTRUCTIONS   The drugs that you were given will stay in your system until tomorrow so for the next 24 hours you should not:  Drive an automobile Make any legal decisions Drink any alcoholic beverage   You may resume regular meals tomorrow.  Today it is better to start with liquids and gradually work up to solid foods.  You may eat anything you prefer, but it is better to start with liquids, then soup and crackers, and gradually work up to solid foods.   Please notify your doctor immediately if you have any unusual bleeding, trouble breathing, redness and pain at the surgery site, drainage, fever, or pain not relieved by medication.    Additional Instructions: Please contact your physician with any problems or Same Day Surgery at [phone], Monday through Friday 6 am to 4 pm, or [place] at [facility] number at [phone].

## 2023-07-18 NOTE — Transfer of Care (Signed)
Immediate Anesthesia Transfer of Care Note  Patient: Tonya Cummings  Procedure(s) Performed: CYSTOSCOPY WITH RETROGRADE PYELOGRAM (Right: Ureter) DIAGNOSTIC URETEROSCOPY (Ureter)  Patient Location: PACU  Anesthesia Type:General  Level of Consciousness: drowsy  Airway & Oxygen Therapy: Patient Spontanous Breathing and Patient connected to face mask oxygen  Post-op Assessment: Report given to RN and Post -op Vital signs reviewed and stable  Post vital signs: stable  Last Vitals:  Vitals Value Taken Time  BP 115/53 07/18/23 1303  Temp    Pulse 72 07/18/23 1305  Resp 15 07/18/23 1305  SpO2 100 % 07/18/23 1305  Vitals shown include unfiled device data.  Last Pain:  Vitals:   07/18/23 0907  TempSrc: Temporal  PainSc: 2          Complications: No notable events documented.

## 2023-07-18 NOTE — Anesthesia Postprocedure Evaluation (Signed)
Anesthesia Post Note  Patient: Tonya Cummings  Procedure(s) Performed: CYSTOSCOPY WITH RETROGRADE PYELOGRAM (Right: Ureter) DIAGNOSTIC URETEROSCOPY (Ureter)  Patient location during evaluation: PACU Anesthesia Type: General Level of consciousness: awake and alert, oriented and patient cooperative Pain management: pain level controlled Vital Signs Assessment: post-procedure vital signs reviewed and stable Respiratory status: spontaneous breathing, nonlabored ventilation and respiratory function stable Cardiovascular status: blood pressure returned to baseline and stable Postop Assessment: adequate PO intake Anesthetic complications: no   No notable events documented.   Last Vitals:  Vitals:   07/18/23 1355 07/18/23 1400  BP:  (!) 136/59  Pulse: 77 78  Resp: 11 18  Temp:    SpO2: 95% 95%    Last Pain:  Vitals:   07/18/23 1400  TempSrc:   PainSc: Asleep                 Reed Breech

## 2023-07-18 NOTE — Interval H&P Note (Signed)
History and Physical Interval Note:  07/18/2023 12:04 PM  Tonya Cummings  has presented today for surgery, with the diagnosis of Right Renal Mass.  The various methods of treatment have been discussed with the patient and family. After consideration of risks, benefits and other options for treatment, the patient has consented to  Procedure(s): CYSTOSCOPY WITH RETROGRADE PYELOGRAM (Right) CYSTOSCOPY WITH RENAL BIOPSY (Right) CYSTOSCOPY WITH STENT PLACEMENT (Right) as a surgical intervention.  The patient's history has been reviewed, patient examined, no change in status, stable for surgery.  I have reviewed the patient's chart and labs.  Questions were answered to the patient's satisfaction.    RRR CTAB  [name]

## 2023-07-18 NOTE — Op Note (Signed)
Date of procedure: 07/18/23  Preoperative diagnosis:  Right renal mass  Postoperative diagnosis:  As above  Procedure: Right retrograde pyelogram Right diagnostic ureteroscopy  Surgeon: [name], MD  Anesthesia: General  Complications: None  Intraoperative findings: Dilated renal pelvis but otherwise no upper tract pathology identified.  Findings consistent with renal cell carcinoma with central location.  EBL: Minimal  Specimens: None  Drains: None  Indication: Tonya Cummings is a 72 y.o. patient with 5.7 cm very centralized renal mass concerning for renal cell carcinoma versus upper tract urothelial carcinoma..  After reviewing the management options for treatment, he elected to proceed with the above surgical procedure(s). We have discussed the potential benefits and risks of the procedure, side effects of the proposed treatment, the likelihood of the patient achieving the goals of the procedure, and any potential problems that might occur during the procedure or recuperation. Informed consent has been obtained.  Description of procedure:  The patient was taken to the operating room and general anesthesia was induced.  The patient was placed in the dorsal lithotomy position, prepped and draped in the usual sterile fashion, and preoperative antibiotics were administered. A preoperative time-out was performed.   A 21 French scope was advanced per urethra into the bladder.  The bladder was inspected and was noted to be normal.  Attention was turned to the right ureteral orifice.  This was intubated using a 5 [place] open-ended ureteral catheter.  A gentle retrograde pyelogram on the side showed a delicate appearing ureter with a mildly dilated renal pelvis but no hydronephrosis.  There is no overt filling defects.  A Super Stiff wire was then placed up to the level of the kidney.  A 7 [place] digital flexible ureteroscope was then advanced all the way into the kidney.  This  went easily over the Super Stiff wire.  The entirety of the renal pelvis as well as calyces were directly visualized.  There was no tumor identified.  The urothelium was normal.  The scope was then backed out lengthy ureter inspecting along the way.  There is no ureteral injuries appreciated.  I elected not to leave a stent.  The bladder was drained, she was cleaned and dried, repositioned in supine position, rest of anesthesia, and taken the PACU stable condition.  Plan: Will have her keep her follow-up appointment next week to discuss upcoming nephrectomy.  [name], M.D.

## 2023-07-19 ENCOUNTER — Encounter: Payer: Self-pay | Admitting: Urology

## 2023-07-20 ENCOUNTER — Encounter: Payer: Self-pay | Admitting: Neurosurgery

## 2023-07-26 ENCOUNTER — Encounter: Payer: Medicare HMO | Admitting: Neurosurgery

## 2023-07-26 ENCOUNTER — Ambulatory Visit (INDEPENDENT_AMBULATORY_CARE_PROVIDER_SITE_OTHER): Payer: Medicare HMO | Admitting: Urology

## 2023-07-26 VITALS — BP 154/71 | HR 102 | Ht 63.0 in | Wt 165.0 lb

## 2023-07-26 DIAGNOSIS — N2889 Other specified disorders of kidney and ureter: Secondary | ICD-10-CM

## 2023-07-26 DIAGNOSIS — N184 Chronic kidney disease, stage 4 (severe): Secondary | ICD-10-CM | POA: Diagnosis not present

## 2023-07-26 NOTE — Progress Notes (Signed)
I,Amy L Pierron,acting as a scribe for [name], MD.,have documented all relevant documentation on the behalf of [name], MD,as directed by  [name], MD while in the presence of [name], MD.  07/26/2023 10:46 AM   Leta Baptist 23-Jul-1951 [id number]  Referring provider: Myrene Buddy, NP [address] [place],  [place] [address]  Chief Complaint  Patient presents with   Post-op Follow-up    HPI: 72 year-old female presents today following a diagnostic ureteroscopy for a right renal mass.  She was first seen and evaluated in clinic on July 01, 2023, for a 5.7 by 4.3 central large right renal mass favoring renal cell carcinoma, although urothelial carcinoma could not be ruled out. She underwent diagnostic ureteroscopy which was negative, and no evidence of urothelial malignancy.   She has multiple medical comorbidities including a personal history of CKD with a baseline creatinine in the 2.5 to 3 range with a GFR of 18. In the interim she's followed by Dr. Cherylann Ratel who feels that undergoing a nephrectomy would most certainly result in the need for dialysis.   She has a personal history of several other abdominal procedures including appendectomy, an open cholecystectomy, and tubal ligation.   She also has a personal history of CHF along with pulmonary issues. She underwent CT of the chest on 07/15/2023, which showed stable bilateral pulmonary nodules from a year ago; unlikely to represent malignancy based on stability.  She reports that she is seeing a doctor next week to discuss a possible shoulder surgery. She also has a history of chronic constipation.    PMH: Past Medical History:  Diagnosis Date   Acute hypoxemic respiratory failure (HCC)    Anemia in stage 4 chronic kidney disease (HCC)    Anxiety    Aortic atherosclerosis (HCC)    CHF (congestive heart failure) (HCC)    a.) TTE 09/30/2021: EF 55-60%, no RWMAs, mild MR, G1DD   Chronic pain     Chronic radicular pain of lower back    CKD (chronic kidney disease), stage IV (HCC)    Coronary artery calcification seen on CT scan    Costochondritis    DDD (degenerative disc disease), lumbar    Depression    Dyspnea    GERD (gastroesophageal reflux disease)    Hiatal hernia    Hip dysplasia    Hyperlipidemia    Hypertension    Insomnia    Iron deficiency    Iron deficiency anemia    Low back pain    Low vitamin B12 level    Lumbar facet joint pain    Migraines    Nose colonized with MRSA 10/03/2020   a.) preop PCR (+) 10/03/2020 prior to RIGHT TKA   Numbness and tingling of right leg    OA (osteoarthritis)    Obesity    OSA (obstructive sleep apnea)    a.) not currently utilizing nocturnal PAP therapy; positional. PCCM feels as if symptom can be mitigated with diet/exercise/weight loss unless develops significant symptoms.   Osteoporosis    a.) recieves denosumab injections   Pelvic fracture (HCC)    Pneumonia    Pulmonary fibrosis (HCC)    Raynaud's disease without gangrene    Restless leg syndrome    a.) on pramipexole + oral Fe supplementation   Rheumatoid arthritis (HCC)    a.) Tx'd with hydroxychloroquine   Right renal mass 05/28/2023   a.) CT renal 05/28/2023:  5.7 cm mass in the medial aspect  of right kidney   Seasonal allergies    Thoracic compression fracture [facility])     Surgical History: Past Surgical History:  Procedure Laterality Date   ABDOMINAL SURGERY     pt denies   APPENDECTOMY     CARPAL TUNNEL RELEASE Bilateral    CHOLECYSTECTOMY     COLONOSCOPY WITH PROPOFOL N/A 08/15/2020   Procedure: COLONOSCOPY WITH PROPOFOL;  Surgeon: Earline Mayotte, MD;  Location: [facility];  Service: Endoscopy;  Laterality: N/A;   CYSTOSCOPY W/ RETROGRADES Right 07/18/2023   Procedure: CYSTOSCOPY WITH RETROGRADE PYELOGRAM;  Surgeon: [name], MD;  Location: [facility] ORS;  Service: Urology;  Laterality: Right;   DILATION AND CURETTAGE OF UTERUS      ESOPHAGOGASTRODUODENOSCOPY (EGD) WITH PROPOFOL N/A 08/15/2020   Procedure: ESOPHAGOGASTRODUODENOSCOPY (EGD) WITH PROPOFOL;  Surgeon: Earline Mayotte, MD;  Location: [facility];  Service: Endoscopy;  Laterality: N/A;   FRACTURE SURGERY     hip fracture    FRACTURE SURGERY     pelvic fracture plate    HIP SURGERY Left    KNEE ARTHROPLASTY Right 10/13/2020   Procedure: COMPUTER ASSISTED TOTAL KNEE ARTHROPLASTY - RNFA;  Surgeon: Donato Heinz, MD;  Location: [facility] ORS;  Service: Orthopedics;  Laterality: Right;   TUBAL LIGATION     URETEROSCOPY  07/18/2023   Procedure: DIAGNOSTIC URETEROSCOPY;  Surgeon: [name], MD;  Location: [facility] OR[facility];  Service: Urology;;    Home Medications:  Allergies as of 07/26/2023       Reactions   Gabapentin Other (See Comments)   Weight gain   Ibuprofen Other (See Comments)   Headache        Medication List        Accurate as of July 26, 2023 10:46 AM. If you have any questions, ask your nurse or doctor.          STOP taking these medications    amoxicillin-clavulanate 875-125 MG tablet Commonly known as: AUGMENTIN Stopped by: [name]       TAKE these medications    calcitRIOL 0.25 MCG capsule Commonly known as: ROCALTROL Take 0.25 mcg by mouth every morning.   Calcium Carb-Cholecalciferol 600-400 MG-UNIT Caps Take 1 capsule by mouth 3 (three) times daily.   Cholecalciferol 50 MCG (2000 UT) Caps Take 2,000 Units by mouth every morning.   cyanocobalamin 500 MCG tablet Commonly known as: VITAMIN B12 Take 500 mcg by mouth every morning.   cyclobenzaprine 10 MG tablet Commonly known as: FLEXERIL Take 10 mg by mouth at bedtime.   enalapril-hydrochlorothiazide 10-25 MG tablet Commonly known as: VASERETIC Take 1 tablet by mouth at bedtime.   Excedrin Tension Headache 500-65 MG Tabs per tablet Generic drug: acetaminophen-caffeine Take 1 tablet by mouth daily as needed (Headache).   ferrous sulfate 325 (65 FE) MG  tablet Take 325 mg by mouth daily with breakfast.   furosemide 40 MG tablet Commonly known as: LASIX Take 60 mg by mouth every morning.   hydroxychloroquine 200 MG tablet Commonly known as: PLAQUENIL Take 1 tablet by mouth 2 (two) times daily.   mirtazapine 30 MG tablet Commonly known as: REMERON Take 1 tablet by mouth at bedtime.   MULTIVITAMIN ADULT PO Take 1 tablet by mouth every morning.   omeprazole 40 MG capsule Commonly known as: PRILOSEC Take 40 mg by mouth every morning.   Pirfenidone 267 MG Tabs Take 3 tablets by mouth 3 (three) times daily.   pramipexole 0.125 MG tablet Commonly known as: MIRAPEX Take 0.125 mg  by mouth daily. Take 0.125 mg in the morning and 2 at bedtime   Prolia 60 MG/ML Sosy injection Generic drug: denosumab Inject 60 mg into the skin every 6 (six) months.   rosuvastatin 5 MG tablet Commonly known as: CRESTOR Take 5 mg by mouth at bedtime. Take every other night   spironolactone 25 MG tablet Commonly known as: ALDACTONE Take 25 mg by mouth every morning.   Venlafaxine HCl 75 MG Tb24 Take 1 tablet by mouth every morning. Take daily with 150 mg for total 225 mg daily   venlafaxine XR 150 MG 24 hr capsule Commonly known as: EFFEXOR-XR Take 150 mg by mouth daily with breakfast. Take with 75 mg for total 225 mg daily        Allergies:  Allergies  Allergen Reactions   Gabapentin Other (See Comments)    Weight gain   Ibuprofen Other (See Comments)    Headache    Family History: Family History  Problem Relation Age of Onset   Diabetes Mother    Hypertension Mother    Aneurysm Father    Diabetes Son    Seizures Son    Osteosarcoma Brother    Cancer Brother    Diabetes Brother    Diabetes Maternal Grandfather    Heart disease Maternal Grandfather     Social History:  reports that she has never smoked. She has never used smokeless tobacco. She reports that she does not drink alcohol and does not use drugs.   Physical  Exam: BP (!) 154/71   Pulse (!) 102   Ht 5\' 3"  (1.6 m)   Wt 165 lb (74.8 kg)   BMI 29.23 kg/m   Constitutional:  Alert and oriented, No acute distress.  Accompanied by her husband today.  Right arm in a sling, appears to be in mild distress. HEENT: [place] AT, moist mucus membranes.  Trachea midline, no masses. Neurologic: Grossly intact, no focal deficits, moving all 4 extremities. Psychiatric: Normal mood and affect.  Pertinent Imaging:  CLINICAL DATA:  Right renal mass.  Assess metastatic disease.   EXAM: CT CHEST WITHOUT CONTRAST   TECHNIQUE: Multidetector CT imaging of the chest was performed following the standard protocol without IV contrast.   RADIATION DOSE REDUCTION: This exam was performed according to the departmental dose-optimization program which includes automated exposure control, adjustment of the mA and/or kV according to patient size and/or use of iterative reconstruction technique.   COMPARISON:  Chest CT 04/02/2022   FINDINGS: Cardiovascular: The heart is normal in size. No pericardial effusion. The aorta is normal in caliber. Stable atherosclerotic calcifications. Stable scattered coronary artery calcifications and mitral valve annular calcifications.   Mediastinum/Nodes: No mediastinal or hilar mass or lymphadenopathy. Small scattered lymph nodes are stable. No supraclavicular or axillary adenopathy. Stable large hiatal hernia.   Lungs/Pleura: Bilateral pulmonary nodules are unchanged since the prior CT scan from over 1 year ago and although metastatic disease cannot be excluded given the patient's right renal mass I would expect some change if this was metastatic disease and favor benign process. No new pulmonary lesions or nodules. No pleural effusion pleural nodules.   Upper Abdomen: Stable vascular calcifications. Again noted is a large right renal mass.   Musculoskeletal: No significant bony findings. Stable thoracic scoliosis and multiple  thoracic compression fractures.   IMPRESSION: 1. Stable bilateral pulmonary nodules since the prior CT scan from over 1 year ago and although metastatic disease cannot be excluded given the patient's right renal mass I  would expect some change if this was metastatic disease and favor benign process. 2. No mediastinal or hilar mass or adenopathy. 3. Stable large hiatal hernia. 4. Stable large right renal mass.   Electronically Signed   By: Rudie Meyer M.D.   On: 07/21/2023 09:26 Personally reviewed the scan and agree with radiologic interpretation.   Assessment & Plan:    Right renal mass  - We had another lengthy and honest discussion today about radical nephrectomy.  Unclear duration and aggressiveness of tumor.  No evidence of metastatic disease at this point in time. -Patient is continue to include surveillance with progression of palliative treatment if she progresses to metastatic disease versus radical nephrectomy -We discussed her comorbidities and how this may impact her outcome.  She is high risk for complications or intraoperative issues.  We discussed how these complications could impact her quality of life specifically progression to end-stage renal disease on dialysis. - In light of other medical comorbities, not in favor of an aggressive approach at this time.  -Plan to perform another MRI, without contrast, for comparison at the end of October/November to evaluate for any interval growth. Put in the orders for this scan.They are in agreement with this plan for now.  Stand that there is no guarantee that this will continue to progress, progress to metastatic disease in the interim.  That being said, surveillance would give [place] a sense of growth rate and aggressiveness of this tumor.  She is most interested in quality of life and concerned about her shoulder or neck at this point. - - Plan to take care of a shoulder issue soon and continue thinking over the decision regarding  treatment for this.  2. Stage 4 CKD  - Continue discussions with Dr. Cherylann Ratel regarding nephrectomy and dialysis.   - Plan to take care of a shoulder issue soon and continue thinking over the decision regarding treatment for this.  Return in about 3 months (around 10/26/2023) for MRI follow up.  I have reviewed the above documentation for accuracy and completeness, and I agree with the above.   [name], MD   [facility] [address] [place], [place] [address] [phone]  I spent 35 total minutes on the day of the encounter including pre-visit review of the medical record, face-to-face time with the patient, and post visit ordering of labs/imaging/tests.

## 2023-07-28 ENCOUNTER — Encounter: Payer: Medicare HMO | Admitting: Neurosurgery

## 2023-08-01 DIAGNOSIS — S46001A Unspecified injury of muscle(s) and tendon(s) of the rotator cuff of right shoulder, initial encounter: Secondary | ICD-10-CM | POA: Diagnosis not present

## 2023-08-01 DIAGNOSIS — J84112 Idiopathic pulmonary fibrosis: Secondary | ICD-10-CM | POA: Diagnosis not present

## 2023-08-02 ENCOUNTER — Telehealth: Payer: Self-pay

## 2023-08-02 ENCOUNTER — Telehealth: Payer: Self-pay | Admitting: Cardiology

## 2023-08-02 NOTE — Telephone Encounter (Signed)
  Patient Consent for Virtual Visit         Tonya Cummings has provided verbal consent on 08/02/2023 for a virtual visit (video or telephone).   CONSENT FOR VIRTUAL VISIT FOR:  Tonya Cummings  By participating in this virtual visit I agree to the following:  I hereby voluntarily request, consent and authorize [facility] and its employed or contracted physicians, physician assistants, nurse practitioners or other licensed health care professionals (the Practitioner), to provide me with telemedicine health care services (the "Services") as deemed necessary by the treating Practitioner. I acknowledge and consent to receive the Services by the Practitioner via telemedicine. I understand that the telemedicine visit will involve communicating with the Practitioner through live audiovisual communication technology and the disclosure of certain medical information by electronic transmission. I acknowledge that I have been given the opportunity to request an in-person assessment or other available alternative prior to the telemedicine visit and am voluntarily participating in the telemedicine visit.  I understand that I have the right to withhold or withdraw my consent to the use of telemedicine in the course of my care at any time, without affecting my right to future care or treatment, and that the Practitioner or I may terminate the telemedicine visit at any time. I understand that I have the right to inspect all information obtained and/or recorded in the course of the telemedicine visit and may receive copies of available information for a reasonable fee.  I understand that some of the potential risks of receiving the Services via telemedicine include:  Delay or interruption in medical evaluation due to technological equipment failure or disruption; Information transmitted may not be sufficient (e.g. poor resolution of images) to allow for appropriate medical decision making by the  Practitioner; and/or  In rare instances, security protocols could fail, causing a breach of personal health information.  Furthermore, I acknowledge that it is my responsibility to provide information about my medical history, conditions and care that is complete and accurate to the best of my ability. I acknowledge that Practitioner's advice, recommendations, and/or decision may be based on factors not within their control, such as incomplete or inaccurate data provided by me or distortions of diagnostic images or specimens that may result from electronic transmissions. I understand that the practice of medicine is not an exact science and that Practitioner makes no warranties or guarantees regarding treatment outcomes. I acknowledge that a copy of this consent can be made available to me via my patient portal [facility] MyChart), or I can request a printed copy by calling the office of [facility].    I understand that my insurance will be billed for this visit.   I have read or had this consent read to me. I understand the contents of this consent, which adequately explains the benefits and risks of the Services being provided via telemedicine.  I have been provided ample opportunity to ask questions regarding this consent and the Services and have had my questions answered to my satisfaction. I give my informed consent for the services to be provided through the use of telemedicine in my medical care

## 2023-08-02 NOTE — Telephone Encounter (Signed)
   Pre-operative Risk Assessment    Patient Name: AZALEIGH SIPE  DOB: Apr 13, 1951 MRN: [id number]      Request for Surgical Clearance    Procedure:  Reverse Shoulder Arthroplasty, Bicepstenodesis  Date of Surgery:  Clearance 08/30/23                                 Surgeon:  Signa Kell MD Surgeon's Group or Practice Name:  [facility]  Phone number:  [phone] Fax number:  [phone]   Type of Clearance Requested:   - Medical    Type of Anesthesia:  Not Indicated   Additional requests/questions:    Signed, Narda Amber   08/02/2023, 1:39 PM

## 2023-08-02 NOTE — Telephone Encounter (Signed)
Pt schedule for tele visit on 08/16/23. Med rec and consent done

## 2023-08-02 NOTE — Telephone Encounter (Signed)
   Name: Tonya Cummings  DOB: December 24, 1950  MRN: [id number]  Primary Cardiologist: None   Preoperative team, please contact this patient and set up a phone call appointment for further preoperative risk assessment. Please obtain consent and complete medication review. Thank you for your help.  I confirm that guidance regarding antiplatelet and oral anticoagulation therapy has been completed and, if necessary, noted below.  None requested    Ronney Asters, NP 08/02/2023, 2:23 PM [facility]

## 2023-08-05 ENCOUNTER — Other Ambulatory Visit: Payer: Self-pay | Admitting: Orthopedic Surgery

## 2023-08-05 DIAGNOSIS — S46001A Unspecified injury of muscle(s) and tendon(s) of the rotator cuff of right shoulder, initial encounter: Secondary | ICD-10-CM

## 2023-08-11 ENCOUNTER — Ambulatory Visit
Admission: RE | Admit: 2023-08-11 | Discharge: 2023-08-11 | Disposition: A | Discharge status: Home / Self Care | Payer: Medicare HMO | Source: Ambulatory Visit | Attending: Orthopedic Surgery | Admitting: Orthopedic Surgery

## 2023-08-11 DIAGNOSIS — S46001A Unspecified injury of muscle(s) and tendon(s) of the rotator cuff of right shoulder, initial encounter: Secondary | ICD-10-CM | POA: Insufficient documentation

## 2023-08-11 DIAGNOSIS — M19011 Primary osteoarthritis, right shoulder: Secondary | ICD-10-CM | POA: Diagnosis not present

## 2023-08-11 DIAGNOSIS — S46811A Strain of other muscles, fascia and tendons at shoulder and upper arm level, right arm, initial encounter: Secondary | ICD-10-CM | POA: Diagnosis not present

## 2023-08-11 DIAGNOSIS — M75121 Complete rotator cuff tear or rupture of right shoulder, not specified as traumatic: Secondary | ICD-10-CM | POA: Diagnosis not present

## 2023-08-16 ENCOUNTER — Ambulatory Visit: Payer: Medicare HMO | Attending: Cardiology | Admitting: Nurse Practitioner

## 2023-08-16 DIAGNOSIS — Z01810 Encounter for preprocedural cardiovascular examination: Secondary | ICD-10-CM | POA: Diagnosis not present

## 2023-08-16 NOTE — Progress Notes (Addendum)
Virtual Visit via Telephone Note   Because of Tonya Cummings's co-morbid illnesses, she is at least at moderate risk for complications without adequate follow up.  This format is felt to be most appropriate for this patient at this time.  The patient did not have access to video technology/had technical difficulties with video requiring transitioning to audio format only (telephone).  All issues noted in this document were discussed and addressed.  No physical exam could be performed with this format.  Please refer to the patient's chart for her consent to telehealth for Tonya Cummings.  Evaluation Performed:  Preoperative cardiovascular risk assessment _____________   Date:  08/16/2023   Patient ID:  Tonya Cummings, DOB 23-Oct-1951, MRN [id number] Patient Location:  Home Provider location:   Office  Primary Care Provider:  Myrene Buddy, NP Primary Cardiologist:  Julien Nordmann, MD  Chief Complaint / Patient Profile   72 y.o. y/o female with a h/o chronic diastolic heart failure, hypertension, anemia, CKD stage IV, chronic back pain, and obesity who is pending Reverse Shoulder Arthroplasty, Bicepstenodesis on 08/30/2023 with Dr. Signa Kell of [facility] and presents today for telephonic preoperative cardiovascular risk assessment.  History of Present Illness    Tonya Cummings is a 72 y.o. female who presents via audio/video conferencing for a telehealth visit today.  Pt was last seen in cardiology clinic on 05/10/2023 by Hedy Camara, NP.  At that time Tonya Cummings was doing well.  She was cleared for back surgery at the time. The patient is now pending procedure as outlined above. Since her last visit, she has done well from a cardiac standpoint.  She has stable chronic bilateral lower extremity edema, unchanged from prior visits.  She denies chest pain, palpitations, dyspnea, pnd, orthopnea, n, v, dizziness, syncope, weight gain, or  early satiety. All other systems reviewed and are otherwise negative except as noted above.   Past Medical History    Past Medical History:  Diagnosis Date   Acute hypoxemic respiratory failure (HCC)    Anemia in stage 4 chronic kidney disease (HCC)    Anxiety    Aortic atherosclerosis (HCC)    CHF (congestive heart failure) (HCC)    a.) TTE 09/30/2021: EF 55-60%, no RWMAs, mild MR, G1DD   Chronic pain    Chronic radicular pain of lower back    CKD (chronic kidney disease), stage IV (HCC)    Coronary artery calcification seen on CT scan    Costochondritis    DDD (degenerative disc disease), lumbar    Depression    Dyspnea    GERD (gastroesophageal reflux disease)    Hiatal hernia    Hip dysplasia    Hyperlipidemia    Hypertension    Insomnia    Iron deficiency    Iron deficiency anemia    Low back pain    Low vitamin B12 level    Lumbar facet joint pain    Migraines    Nose colonized with MRSA 10/03/2020   a.) preop PCR (+) 10/03/2020 prior to RIGHT TKA   Numbness and tingling of right leg    OA (osteoarthritis)    Obesity    OSA (obstructive sleep apnea)    a.) not currently utilizing nocturnal PAP therapy; positional. PCCM feels as if symptom can be mitigated with diet/exercise/weight loss unless develops significant symptoms.   Osteoporosis    a.) recieves denosumab injections   Pelvic fracture (HCC)  Pneumonia    Pulmonary fibrosis (HCC)    Raynaud's disease without gangrene    Restless leg syndrome    a.) on pramipexole + oral Fe supplementation   Rheumatoid arthritis (HCC)    a.) Tx'd with hydroxychloroquine   Right renal mass 05/28/2023   a.) CT renal 05/28/2023:  5.7 cm mass in the medial aspect of right kidney   Seasonal allergies    Thoracic compression fracture [facility])    Past Surgical History:  Procedure Laterality Date   ABDOMINAL SURGERY     pt denies   APPENDECTOMY     CARPAL TUNNEL RELEASE Bilateral    CHOLECYSTECTOMY     COLONOSCOPY WITH  PROPOFOL N/A 08/15/2020   Procedure: COLONOSCOPY WITH PROPOFOL;  Surgeon: Earline Mayotte, MD;  Location: [facility];  Service: Endoscopy;  Laterality: N/A;   CYSTOSCOPY W/ RETROGRADES Right 07/18/2023   Procedure: CYSTOSCOPY WITH RETROGRADE PYELOGRAM;  Surgeon: [name], MD;  Location: [facility];  Service: Urology;  Laterality: Right;   DILATION AND CURETTAGE OF UTERUS     ESOPHAGOGASTRODUODENOSCOPY (EGD) WITH PROPOFOL N/A 08/15/2020   Procedure: ESOPHAGOGASTRODUODENOSCOPY (EGD) WITH PROPOFOL;  Surgeon: Earline Mayotte, MD;  Location: [facility];  Service: Endoscopy;  Laterality: N/A;   FRACTURE SURGERY     hip fracture    FRACTURE SURGERY     pelvic fracture plate    HIP SURGERY Left    KNEE ARTHROPLASTY Right 10/13/2020   Procedure: COMPUTER ASSISTED TOTAL KNEE ARTHROPLASTY - RNFA;  Surgeon: Donato Heinz, MD;  Location: [facility];  Service: Orthopedics;  Laterality: Right;   TUBAL LIGATION     URETEROSCOPY  07/18/2023   Procedure: DIAGNOSTIC URETEROSCOPY;  Surgeon: [name], MD;  Location: [facility];  Service: Urology;;    Allergies  Allergies  Allergen Reactions   Gabapentin Other (See Comments)    Weight gain   Ibuprofen Other (See Comments)    Headache    Home Medications    Prior to Admission medications   Medication Sig Start Date End Date Taking? Authorizing Provider  Acetaminophen-Caffeine (EXCEDRIN TENSION HEADACHE) 500-65 MG TABS Take 1 tablet by mouth daily as needed (Headache).    [provider]  calcitRIOL (ROCALTROL) 0.25 MCG capsule Take 0.25 mcg by mouth every morning. 05/22/21   [provider]  Calcium Carb-Cholecalciferol 600-400 MG-UNIT CAPS Take 1 capsule by mouth 3 (three) times daily. 01/13/10   [provider]  Cholecalciferol 50 MCG (2000 UT) CAPS Take 2,000 Units by mouth every morning. 09/23/09   [provider]  cyclobenzaprine (FLEXERIL) 10 MG tablet Take 10 mg by mouth at bedtime.    [provider]  enalapril-hydrochlorothiazide (VASERETIC) 10-25 MG tablet Take 1 tablet by mouth at bedtime.    [provider]  ferrous sulfate 325 (65 FE) MG tablet Take 325 mg by mouth daily with breakfast.    [provider]  furosemide (LASIX) 40 MG tablet Take 60 mg by mouth every morning. 10/08/21   [provider]  hydroxychloroquine (PLAQUENIL) 200 MG tablet Take 1 tablet by mouth 2 (two) times daily. 06/22/21   [provider]  mirtazapine (REMERON) 30 MG tablet Take 1 tablet by mouth at bedtime. 06/29/22   [provider]  Multiple Vitamins-Minerals (MULTIVITAMIN ADULT PO) Take 1 tablet by mouth every morning. 09/10/08   [provider]  omeprazole (PRILOSEC) 40 MG capsule Take 40 mg by mouth every morning.    [provider]  Pirfenidone 267 MG TABS  Take 3 tablets by mouth 3 (three) times daily. 12/24/22   [provider]  pramipexole (MIRAPEX) 0.125 MG tablet Take 0.125 mg by mouth daily. Take 0.125 mg in the morning and 2 at bedtime 02/15/18   [provider]  PROLIA 60 MG/ML SOSY injection Inject 60 mg into the skin every 6 (six) months. Patient not taking: Reported on 08/02/2023 04/06/23   [provider]  rosuvastatin (CRESTOR) 5 MG tablet Take 5 mg by mouth at bedtime. Take every other night 07/18/19   [provider]  spironolactone (ALDACTONE) 25 MG tablet Take 25 mg by mouth every morning. 04/09/21   [provider]  Venlafaxine HCl 75 MG TB24 Take 1 tablet by mouth every morning. Take daily with 150 mg for total 225 mg daily    [provider]  venlafaxine XR (EFFEXOR-XR) 150 MG 24 hr capsule Take 150 mg by mouth daily with breakfast. Take with 75 mg for total 225 mg daily 12/28/22 12/28/23  [provider]  vitamin B-12 (CYANOCOBALAMIN) 500 MCG tablet Take 500 mcg by mouth every morning.    [provider]    Physical Exam    Vital Signs:  KATISHA DUDDY does not have vital signs available for review today.  Given telephonic nature of communication, physical exam is limited. AAOx3. NAD. Normal affect.  Speech and respirations are unlabored.  Accessory Clinical Findings    None  Assessment & Plan    1.  Preoperative Cardiovascular Risk Assessment:  According to the Revised Cardiac Risk Index (RCRI), her Perioperative Risk of Major Cardiac Event is (%): 6.6. Her Functional Capacity in METs is: 5.04 according to the [employer] Activity Status Index (DASI).Therefore, based on ACC/AHA guidelines, patient would be at acceptable risk for the planned procedure without further cardiovascular testing.   The patient was advised that if she develops new symptoms prior to surgery to contact our office to arrange for a follow-up visit, and she verbalized understanding.   A copy of this note will be routed to requesting surgeon.  Time:   Today, I have spent 5 minutes with the patient with telehealth technology discussing medical history, symptoms, and management plan.     Joylene Grapes, NP  08/16/2023, 10:08 AM

## 2023-08-17 DIAGNOSIS — N184 Chronic kidney disease, stage 4 (severe): Secondary | ICD-10-CM | POA: Diagnosis not present

## 2023-08-17 DIAGNOSIS — N2581 Secondary hyperparathyroidism of renal origin: Secondary | ICD-10-CM | POA: Diagnosis not present

## 2023-08-17 DIAGNOSIS — R809 Proteinuria, unspecified: Secondary | ICD-10-CM | POA: Diagnosis not present

## 2023-08-17 DIAGNOSIS — D631 Anemia in chronic kidney disease: Secondary | ICD-10-CM | POA: Diagnosis not present

## 2023-08-17 DIAGNOSIS — I10 Essential (primary) hypertension: Secondary | ICD-10-CM | POA: Diagnosis not present

## 2023-08-19 ENCOUNTER — Other Ambulatory Visit: Payer: Self-pay | Admitting: Orthopedic Surgery

## 2023-08-23 ENCOUNTER — Encounter: Payer: Medicare HMO | Admitting: Neurosurgery

## 2023-08-25 ENCOUNTER — Other Ambulatory Visit: Payer: Self-pay

## 2023-08-25 ENCOUNTER — Encounter: Payer: Self-pay | Admitting: Orthopedic Surgery

## 2023-08-25 ENCOUNTER — Encounter
Admission: RE | Admit: 2023-08-25 | Discharge: 2023-08-25 | Disposition: A | Discharge status: Home / Self Care | Payer: Medicare HMO | Source: Ambulatory Visit | Attending: Orthopedic Surgery | Admitting: Orthopedic Surgery

## 2023-08-25 VITALS — BP 141/68 | HR 88 | Resp 18 | Ht 63.0 in | Wt 161.6 lb

## 2023-08-25 DIAGNOSIS — M25811 Other specified joint disorders, right shoulder: Secondary | ICD-10-CM | POA: Insufficient documentation

## 2023-08-25 DIAGNOSIS — Z01812 Encounter for preprocedural laboratory examination: Secondary | ICD-10-CM | POA: Diagnosis not present

## 2023-08-25 DIAGNOSIS — Z01818 Encounter for other preprocedural examination: Secondary | ICD-10-CM | POA: Diagnosis present

## 2023-08-25 DIAGNOSIS — Z22322 Carrier or suspected carrier of Methicillin resistant Staphylococcus aureus: Secondary | ICD-10-CM | POA: Diagnosis not present

## 2023-08-25 DIAGNOSIS — Z22321 Carrier or suspected carrier of Methicillin susceptible Staphylococcus aureus: Secondary | ICD-10-CM | POA: Diagnosis not present

## 2023-08-25 HISTORY — DX: Angina pectoris, unspecified: I20.9

## 2023-08-25 HISTORY — DX: Other complications of anesthesia, initial encounter: T88.59XA

## 2023-08-25 LAB — COMPREHENSIVE METABOLIC PANEL
ALT: 22 U/L (ref 0–44)
AST: 33 U/L (ref 15–41)
Albumin: 3.6 g/dL (ref 3.5–5.0)
Alkaline Phosphatase: 69 U/L (ref 38–126)
Anion gap: 14 (ref 5–15)
BUN: 63 mg/dL — ABNORMAL HIGH (ref 8–23)
CO2: 27 mmol/L (ref 22–32)
Calcium: 8.9 mg/dL (ref 8.9–10.3)
Chloride: 97 mmol/L — ABNORMAL LOW (ref 98–111)
Creatinine, Ser: 2.45 mg/dL — ABNORMAL HIGH (ref 0.44–1.00)
GFR, Estimated: 21 mL/min — ABNORMAL LOW (ref >60)
Glucose, Bld: 92 mg/dL (ref 70–99)
Potassium: 3.7 mmol/L (ref 3.5–5.1)
Sodium: 138 mmol/L (ref 135–145)
Total Bilirubin: 0.4 mg/dL (ref 0.3–1.2)
Total Protein: 7.6 g/dL (ref 6.5–8.1)

## 2023-08-25 LAB — CBC WITH DIFFERENTIAL/PLATELET
Abs Immature Granulocytes: 0.04 10*3/uL (ref 0.00–0.07)
Basophils Absolute: 0 10*3/uL (ref 0.0–0.1)
Basophils Relative: 0 %
Eosinophils Absolute: 0.1 10*3/uL (ref 0.0–0.5)
Eosinophils Relative: 1 %
HCT: 27.9 % — ABNORMAL LOW (ref 36.0–46.0)
Hemoglobin: 8.9 g/dL — ABNORMAL LOW (ref 12.0–15.0)
Immature Granulocytes: 0 %
Lymphocytes Relative: 16 %
Lymphs Abs: 1.4 10*3/uL (ref 0.7–4.0)
MCH: 29.2 pg (ref 26.0–34.0)
MCHC: 31.9 g/dL (ref 30.0–36.0)
MCV: 91.5 fL (ref 80.0–100.0)
Monocytes Absolute: 1 10*3/uL (ref 0.1–1.0)
Monocytes Relative: 11 %
Neutro Abs: 6.4 10*3/uL (ref 1.7–7.7)
Neutrophils Relative %: 72 %
Platelets: 252 10*3/uL (ref 150–400)
RBC: 3.05 MIL/uL — ABNORMAL LOW (ref 3.87–5.11)
RDW: 14.8 % (ref 11.5–15.5)
WBC: 9 10*3/uL (ref 4.0–10.5)
nRBC: 0 % (ref 0.0–0.2)

## 2023-08-25 LAB — URINALYSIS, ROUTINE W REFLEX MICROSCOPIC
Bilirubin Urine: NEGATIVE
Glucose, UA: NEGATIVE mg/dL
Hgb urine dipstick: NEGATIVE
Ketones, ur: NEGATIVE mg/dL
Nitrite: NEGATIVE
Protein, ur: NEGATIVE mg/dL
Specific Gravity, Urine: 1.01 (ref 1.005–1.030)
pH: 5 (ref 5.0–8.0)

## 2023-08-25 LAB — SURGICAL PCR SCREEN
MRSA, PCR: POSITIVE — AB
Staphylococcus aureus: POSITIVE — AB

## 2023-08-25 LAB — TYPE AND SCREEN
ABO/RH(D): O POS
Antibody Screen: NEGATIVE

## 2023-08-25 MED ORDER — MUPIROCIN 2 % EX OINT: TOPICAL_OINTMENT | CUTANEOUS | 0 refills | Status: DC

## 2023-08-25 NOTE — Patient Instructions (Addendum)
Your procedure is scheduled on: 08/30/23 - Tuesday Report to the Registration Desk on the 1st floor of the [facility]. To find out your arrival time, please call [phone] between 1PM - 3PM on: 08/29/23 - Monday If your arrival time is 6:00 am, do not arrive before that time as the [facility] entrance doors do not open until 6:00 am.  REMEMBER: Instructions that are not followed completely may result in serious medical risk, up to and including death; or upon the discretion of your surgeon and anesthesiologist your surgery may need to be rescheduled.  Do not eat food after midnight the night before surgery.  No gum chewing or hard candies.  You may however, drink CLEAR liquids up to 2 hours before you are scheduled to arrive for your surgery. Do not drink anything within 2 hours of your scheduled arrival time.  Clear liquids include: - water  - apple juice without pulp - gatorade (not RED colors) - black coffee or tea (Do NOT add milk or creamers to the coffee or tea) Do NOT drink anything that is not on this list.   In addition, your doctor has ordered for you to drink the provided:  Ensure Pre-Surgery Clear Carbohydrate Drink  Drinking this carbohydrate drink up to two hours before surgery helps to reduce insulin resistance and improve patient outcomes. Please complete drinking 2 hours before scheduled arrival time.  One week prior to surgery: Stop Anti-inflammatories (NSAIDS) such as Advil, Aleve, Ibuprofen, Motrin, Naproxen, Naprosyn and Aspirin based products such as Excedrin, Goody's Powder, BC Powder. You may however, continue to take Tylenol if needed for pain up until the day of surgery.  Stop ANY OVER THE COUNTER supplements until after surgery : Calcium Carb-Cholecalciferol , Cholecalciferol , Multiple Vitamins-Minerals    TAKE ONLY THESE MEDICATIONS THE MORNING OF SURGERY WITH A SIP OF WATER:  omeprazole (PRILOSEC) - (take one the night before and one on the  morning of surgery - helps to prevent nausea after surgery.) ferrous sulfate  hydroxychloroquine (PLAQUENIL)  pramipexole (MIRAPEX)  Venlafaxine    No Alcohol for 24 hours before or after surgery.  No Smoking including e-cigarettes for 24 hours before surgery.  No chewable tobacco products for at least 6 hours before surgery.  No nicotine patches on the day of surgery.  Do not use any "recreational" drugs for at least a week (preferably 2 weeks) before your surgery.  Please be advised that the combination of cocaine and anesthesia may have negative outcomes, up to and including death. If you test positive for cocaine, your surgery will be cancelled.  On the morning of surgery brush your teeth with toothpaste and water, you may rinse your mouth with mouthwash if you wish. Do not swallow any toothpaste or mouthwash.  Use CHG Soap or wipes as directed on instruction sheet.  Do not wear jewelry, make-up, hairpins, clips or nail polish.  For welded (permanent) jewelry: bracelets, anklets, waist bands, etc.  Please have this removed prior to surgery.  If it is not removed, there is a chance that hospital personnel will need to cut it off on the day of surgery.  Do not wear lotions, powders, or perfumes.   Contact lenses, hearing aids and dentures may not be worn into surgery.  Do not bring valuables to the hospital. [facility] is not responsible for any missing/lost belongings or valuables.   Total Shoulder Arthroplasty:  use Benzoyl Peroxide 5% Gel as directed on instruction sheet.  Notify your  doctor if there is any change in your medical condition (cold, fever, infection).  Wear comfortable clothing (specific to your surgery type) to the hospital.  After surgery, you can help prevent lung complications by doing breathing exercises.  Take deep breaths and cough every 1-2 hours. Your doctor may order a device called an Incentive Spirometer to help you take deep breaths. When  coughing or sneezing, hold a pillow firmly against your incision with both hands. This is called "splinting." Doing this helps protect your incision. It also decreases belly discomfort.  If you are being admitted to the hospital overnight, leave your suitcase in the car. After surgery it may be brought to your room.  In case of increased patient census, it may be necessary for you, the patient, to continue your postoperative care in the Same Day Surgery department.  If you are being discharged the day of surgery, you will not be allowed to drive home. You will need a responsible individual to drive you home and stay with you for 24 hours after surgery.   If you are taking public transportation, you will need to have a responsible individual with you.  Please call the Pre-admissions Testing Dept. at [phone] if you have any questions about these instructions.  Surgery Visitation Policy:  Patients having surgery or a procedure may have two visitors.  Children under the age of 68 must have an adult with them who is not the patient.  Inpatient Visitation:    Visiting hours are 7 a.m. to 8 p.m. Up to four visitors are allowed at one time in a patient room. The visitors may rotate out with other people during the day.  One visitor age 3 or older may stay with the patient overnight and must be in the room by 8 p.m.    Pre-operative 5 CHG Bath Instructions   You can play a key role in reducing the risk of infection after surgery. Your skin needs to be as free of germs as possible. You can reduce the number of germs on your skin by washing with CHG (chlorhexidine gluconate) soap before surgery. CHG is an antiseptic soap that kills germs and continues to kill germs even after washing.   DO NOT use if you have an allergy to chlorhexidine/CHG or antibacterial soaps. If your skin becomes reddened or irritated, stop using the CHG and notify one of our RNs at [phone].   Please shower  with the CHG soap starting 4 days before surgery using the following schedule: 09/13 - 09/17.    Please keep in mind the following:  DO NOT shave, including legs and underarms, starting the day of your first shower.   You may shave your face at any point before/day of surgery.  Place clean sheets on your bed the day you start using CHG soap. Use a clean washcloth (not used since being washed) for each shower. DO NOT sleep with pets once you start using the CHG.   CHG Shower Instructions:  If you choose to wash your hair and private area, wash first with your normal shampoo/soap.  After you use shampoo/soap, rinse your hair and body thoroughly to remove shampoo/soap residue.  Turn the water OFF and apply about 3 tablespoons (45 ml) of CHG soap to a CLEAN washcloth.  Apply CHG soap ONLY FROM YOUR NECK DOWN TO YOUR TOES (washing for 3-5 minutes)  DO NOT use CHG soap on face, private areas, open wounds, or sores.  Pay special attention to  the area where your surgery is being performed.  If you are having back surgery, having someone wash your back for you may be helpful. Wait 2 minutes after CHG soap is applied, then you may rinse off the CHG soap.  Pat dry with a clean towel  Put on clean clothes/pajamas   If you choose to wear lotion, please use ONLY the CHG-compatible lotions on the back of this paper.     Additional instructions for the day of surgery: DO NOT APPLY any lotions, deodorants, cologne, or perfumes.   Put on clean/comfortable clothes.  Brush your teeth.  Ask your nurse before applying any prescription medications to the skin.      CHG Compatible Lotions   Aveeno Moisturizing lotion  Cetaphil Moisturizing Cream  Cetaphil Moisturizing Lotion  Clairol Herbal Essence Moisturizing Lotion, Dry Skin  Clairol Herbal Essence Moisturizing Lotion, Extra Dry Skin  Clairol Herbal Essence Moisturizing Lotion, Normal Skin  Curel Age Defying Therapeutic Moisturizing Lotion with  Alpha Hydroxy  Curel Extreme Care Body Lotion  Curel Soothing Hands Moisturizing Hand Lotion  Curel Therapeutic Moisturizing Cream, Fragrance-Free  Curel Therapeutic Moisturizing Lotion, Fragrance-Free  Curel Therapeutic Moisturizing Lotion, Original Formula  Eucerin Daily Replenishing Lotion  Eucerin Dry Skin Therapy Plus Alpha Hydroxy Crme  Eucerin Dry Skin Therapy Plus Alpha Hydroxy Lotion  Eucerin Original Crme  Eucerin Original Lotion  Eucerin Plus Crme Eucerin Plus Lotion  Eucerin TriLipid Replenishing Lotion  Keri Anti-Bacterial Hand Lotion  Keri Deep Conditioning Original Lotion Dry Skin Formula Softly Scented  Keri Deep Conditioning Original Lotion, Fragrance Free Sensitive Skin Formula  Keri Lotion Fast Absorbing Fragrance Free Sensitive Skin Formula  Keri Lotion Fast Absorbing Softly Scented Dry Skin Formula  Keri Original Lotion  Keri Skin Renewal Lotion Keri Silky Smooth Lotion  Keri Silky Smooth Sensitive Skin Lotion  Nivea Body Creamy Conditioning Oil  Nivea Body Extra Enriched Lotion  Nivea Body Original Lotion  Nivea Body Sheer Moisturizing Lotion Nivea Crme  Nivea Skin Firming Lotion  NutraDerm 30 Skin Lotion  NutraDerm Skin Lotion  NutraDerm Therapeutic Skin Cream  NutraDerm Therapeutic Skin Lotion  ProShield Protective Hand Cream  Provon moisturizing lotion  Preparing for Total Shoulder Arthroplasty  Before surgery, you can play an important role by reducing the number of germs on your skin by using the following products:  Benzoyl Peroxide Gel  o Reduces the number of germs present on the skin  o Applied twice a day to shoulder area starting two days before surgery  Chlorhexidine Gluconate (CHG) Soap  o An antiseptic cleaner that kills germs and bonds with the skin to continue killing germs even after washing  o Used for showering the night before surgery and morning of surgery  BENZOYL PEROXIDE 5% GEL  Please do not use if you have an  allergy to benzoyl peroxide. If your skin becomes reddened/irritated stop using the benzoyl peroxide.  Starting two days before surgery, apply as follows:  1. Apply benzoyl peroxide in the morning and at night. Apply after taking a shower. If you are not taking a shower, clean entire shoulder front, back, and side along with the armpit with a clean wet washcloth.  2. Place a quarter-sized dollop on your shoulder and rub in thoroughly, making sure to cover the front, back, and side of your shoulder, along with the armpit.  2 days before starting on 08/28/23 - apply once in AM and once in PM after you shower.  On 08/29/23 apply once in AM  and again in  PM after you shower.  3. Do this twice a day for two days. (Last application is the night before surgery, AFTER using the CHG soap).  4. Do NOT apply benzoyl peroxide gel on the day of surgery.  How to Use an Incentive Spirometer  An incentive spirometer is a tool that measures how well you are filling your lungs with each breath. Learning to take long, deep breaths using this tool can help you keep your lungs clear and active. This may help to reverse or lessen your chance of developing breathing (pulmonary) problems, especially infection. You may be asked to use a spirometer: After a surgery. If you have a lung problem or a history of smoking. After a long period of time when you have been unable to move or be active. If the spirometer includes an indicator to show the highest number that you have reached, your health care provider or respiratory therapist will help you set a goal. Keep a log of your progress as told by your health care provider. What are the risks? Breathing too quickly may cause dizziness or cause you to pass out. Take your time so you do not get dizzy or light-headed. If you are in pain, you may need to take pain medicine before doing incentive spirometry. It is harder to take a deep breath if you are having pain. How to use  your incentive spirometer  Sit up on the edge of your bed or on a chair. Hold the incentive spirometer so that it is in an upright position. Before you use the spirometer, breathe out normally. Place the mouthpiece in your mouth. Make sure your lips are closed tightly around it. Breathe in slowly and as deeply as you can through your mouth, causing the piston or the ball to rise toward the top of the chamber. Hold your breath for 3-5 seconds, or for as long as possible. If the spirometer includes a coach indicator, use this to guide you in breathing. Slow down your breathing if the indicator goes above the marked areas. Remove the mouthpiece from your mouth and breathe out normally. The piston or ball will return to the bottom of the chamber. Rest for a few seconds, then repeat the steps 10 or more times. Take your time and take a few normal breaths between deep breaths so that you do not get dizzy or light-headed. Do this every 1-2 hours when you are awake. If the spirometer includes a goal marker to show the highest number you have reached (best effort), use this as a goal to work toward during each repetition. After each set of 10 deep breaths, cough a few times. This will help to make sure that your lungs are clear. If you have an incision on your chest or abdomen from surgery, place a pillow or a rolled-up towel firmly against the incision when you cough. This can help to reduce pain while taking deep breaths and coughing. General tips When you are able to get out of bed: Walk around often. Continue to take deep breaths and cough in order to clear your lungs. Keep using the incentive spirometer until your health care provider says it is okay to stop using it. If you have been in the hospital, you may be told to keep using the spirometer at home. Contact a health care provider if: You are having difficulty using the spirometer. You have trouble using the spirometer as often as  instructed. Your pain medicine  is not giving enough relief for you to use the spirometer as told. You have a fever. Get help right away if: You develop shortness of breath. You develop a cough with bloody mucus from the lungs. You have fluid or blood coming from an incision site after you cough. Summary An incentive spirometer is a tool that can help you learn to take long, deep breaths to keep your lungs clear and active. You may be asked to use a spirometer after a surgery, if you have a lung problem or a history of smoking, or if you have been inactive for a long period of time. Use your incentive spirometer as instructed every 1-2 hours while you are awake. If you have an incision on your chest or abdomen, place a pillow or a rolled-up towel firmly against your incision when you cough. This will help to reduce pain. Get help right away if you have shortness of breath, you cough up bloody mucus, or blood comes from your incision when you cough. This information is not intended to replace advice given to you by your health care provider. Make sure you discuss any questions you have with your health care provider. Document Revised: 02/18/2020 Document Reviewed: 02/18/2020 Elsevier Patient Education  2023 Elsevier Inc.  POLAR CARE INFORMATION  MassAdvertisement.it  How to use [facility] Therapy System?  YouTube   ShippingScam.co.uk  OPERATING INSTRUCTIONS  Start the product With dry hands, connect the transformer to the electrical connection located on the top of the cooler. Next, plug the transformer into an appropriate electrical outlet. The unit will automatically start running at this point.  To stop the pump, disconnect electrical power.  Unplug to stop the product when not in use. Unplugging the Polar Care unit turns it off. Always unplug immediately after use. Never leave it plugged in while unattended. Remove pad.    FIRST ADD WATER TO FILL  LINE, THEN ICE---Replace ice when existing ice is almost melted  1 Discuss Treatment with your Licensed Health Care Practitioner and Use Only as Prescribed 2 Apply Insulation Barrier & Cold Therapy Pad 3 Check for Moisture 4 Inspect Skin Regularly  Tips and Trouble Shooting Usage Tips 1. Use cubed or chunked ice for optimal performance. 2. It is recommended to drain the Pad between uses. To drain the pad, hold the Pad upright with the hose pointed toward the ground. Depress the black plunger and allow water to drain out. 3. You may disconnect the Pad from the unit without removing the pad from the affected area by depressing the silver tabs on the hose coupling and gently pulling the hoses apart. The Pad and unit will seal itself and will not leak. Note: Some dripping during release is normal. 4. DO NOT RUN PUMP WITHOUT WATER! The pump in this unit is designed to run with water. Running the unit without water will cause permanent damage to the pump. 5. Unplug unit before removing lid.  TROUBLESHOOTING GUIDE Pump not running, Water not flowing to the pad, Pad is not getting cold 1. Make sure the transformer is plugged into the wall outlet. 2. Confirm that the ice and water are filled to the indicated levels. 3. Make sure there are no kinks in the pad. 4. Gently pull on the blue tube to make sure the tube/pad junction is straight. 5. Remove the pad from the treatment site and ll it while the pad is lying at; then reapply. 6. Confirm that the pad couplings are  securely attached to the unit. Listen for the double clicks (Figure 1) to confirm the pad couplings are securely attached.  Leaks    Note: Some condensation on the lines, controller, and pads is unavoidable, especially in warmer climates. 1. If using a [employer] Polar Care Cold Therapy unit with a detachable Cold Therapy Pad, and a leak exists (other than condensation on the lines) disconnect the pad couplings. Make sure the silver tabs on the  couplings are depressed before reconnecting the pad to the pump hose; then confirm both sides of the coupling are properly clicked in. 2. If the coupling continues to leak or a leak is detected in the pad itself, stop using it and call [employer] Customer Care at [phone].  Cleaning After use, empty and dry the unit with a soft cloth. Warm water and mild detergent may be used occasionally to clean the pump and tubes.  WARNING: The Polar Care Cube can be cold enough to cause serious injury, including full skin necrosis. Follow these Operating Instructions, and carefully read the Product Insert (see pouch on side of unit) and the Cold Therapy Pad Fitting Instructions (provided with each Cold Therapy Pad) prior to use.

## 2023-08-25 NOTE — Progress Notes (Signed)
  Perioperative Services  Abnormal Lab Notification and Treatment Plan of Care  Date: 08/25/23  Name: Tonya Cummings MRN:   [id number]  Re: Abnormal labs noted during PAT appointment  Provider Notified: Signa Kell, MD Notification mode: Routed and/or faxed via [facility]  Labs of concern: Lab Results  Component Value Date   STAPHAUREUS POSITIVE (A) 08/25/2023   MRSAPCR POSITIVE (A) 08/25/2023    Notes: Patient is scheduled for an elective RIGHT REVERSE SHOULDER ARTHROPLASTY; BICEPS TENODESIS on 08/30/2023. She is scheduled to receive CEFAZOLIN pre-operatively. Pre-surgical PCR (+) for MRSA; see above.  PLANS:  Review renal function. Estimated Creatinine Clearance: 20.5 mL/min (A) (by C-G formula based on SCr of 2.45 mg/dL (H)). Loading dose of 20-25 mg/kg acceptable based on documented renal function.   Review allergies. No documented allergy to vancomycin.  Order added for VANCOMYCIN 1 GRAM IV to current preoperative prophylactic regimen.   Patient with orders for both CEFAZOLIN + VANCOMYCIN to be given in the setting of documented MRSA (+) surgical PCR.   Guidelines suggest that a beta-lactam antibiotic (first or second generation cephalosporin) should be added for activity against gram-negative organisms.  Vancomycin appears to be less effective than cefazolin for preventing SSIs caused by MSSA. For this reason, the use of vancomycin in combination with cefazolin is favored for prevention of SSI due to MRSA and coagulase-negative staphylococci.  Medical history in [facility] updated to reflect (+) PCR result indicating nasal MRSA colonization   Rx for additional preoperative nasal decolonization sent to patient's pharmacy of record per MD request.   Meds ordered this encounter  Medications   mupirocin ointment (BACTROBAN) 2 %    Sig: Apply small about inside of both nostrils TWICE a day for the next 5 days.    Dispense:  15 g    Refill:  0   Quentin Mulling, MSN, APRN, FNP-C,  CEN [facility]  Perioperative Services Nurse Practitioner Phone: [phone] 08/25/23 7:16 PM

## 2023-08-29 NOTE — Progress Notes (Signed)
Perioperative / Anesthesia Services  Pre-Admission Testing Clinical Review / Preoperative Anesthesia Consult  Date: 08/29/23  Patient Demographics:  Name: Tonya Cummings DOB:   1951/02/20 MRN:   [id number]  Planned Surgical Procedure(s):    Case: [id number] Date/Time: 08/30/23 0955   Procedures:      Right reverse shoulder arthroplasty, biceps tenodesis (Right: Shoulder)     Right reverse shoulder arthroplasty, biceps tenodesis (Right)   Anesthesia type: Choice   Pre-op diagnosis: Injury of right rotator cuff, initial encounter S46.001A   Location: [facility] OR ROOM 01 / [facility]   Surgeons: Signa Kell, MD     NOTE: Available PAT nursing documentation and vital signs have been reviewed. Clinical nursing staff has updated patient's PMH/PSHx, current medication list, and drug allergies/intolerances to ensure comprehensive history available to assist in medical decision making as it pertains to the aforementioned surgical procedure and anticipated anesthetic course. Extensive review of available clinical information personally performed. [place] PMH and PSHx updated with any diagnoses/procedures that  may have been inadvertently omitted during her intake with the pre-admission testing department's nursing staff.  Clinical Discussion:  Tonya Cummings is a 72 y.o. female who is submitted for pre-surgical anesthesia review and clearance prior to her undergoing the above procedure. Patient has never been a smoker. Pertinent PMH includes: CAD, CHF, aortic atherosclerosis, HTN, HLD, ILD, pulmonary fibrosis, OSAH (no nocturnal PAP therapy), CKD-IV, GERD (on daily PPI), large hiatal hernia, anemia of chronic renal disease, RIGHT renal mass, RA, OA, osteoporosis, chronic thoracic compression fractures, RLS, lumbar DDD with associated chronic radicular lower back pain, anxiety, depression, insomnia.  Patient is followed by cardiology Mariah Milling, MD). She was last seen in the  cardiology clinic on 05/10/2023; notes reviewed. At the time of her clinic visit, patient doing well overall from a cardiovascular perspective.  Patient with chronic shortness of breath related to her ILD/pulmonary fibrosis diagnosis.  She is under the care of pulmonary medicine Karna Christmas, MD) at this time and is being treated with antifibrotic therapy (pirfenidone). Patient denied any chest pain, significant increase in shortness of breath, PND, orthopnea, palpitations, significant peripheral edema, weakness, fatigue, vertiginous symptoms, or presyncope/syncope.  Patient's main complaint in the clinic were pain in her neck with associated radiculopathy.  Patient being seen by neurosurgery and scheduled for cervical spine procedure  in the near future.  Patient with a past medical history significant for cardiovascular diagnoses. Documented physical exam was grossly benign, providing no evidence of acute exacerbation and/or decompensation of the patient's known cardiovascular conditions.  Most recent TTE was performed on 01/31/2021 revealing normal left ventricular systolic function with an EF of 55 to 60%.  There were no regional wall motion abnormalities. Left ventricular diastolic Doppler parameters consistent with abnormal relaxation (G1DD).  Right ventricular size and function normal.  Was mild mitral valve regurgitation.  All transvalvular gradients were noted to be normal providing no evidence suggestive of valvular stenosis.  Aorta normal in size with no evidence of aneurysmal dilatation.  Blood pressure well controlled at 124/66 mmHg on currently prescribed ACEi (enalapril) and diuretic (HCTZ + furosemide + spironolactone) therapies.  Patient is on rosuvastatin for her HLD diagnosis and ASCVD prevention.  Patient is not diabetic.  She does have an OSAH diagnosis.  Pulmonology reviewed PSG and felt that patient's OSAH was positional and could be mitigated by diet/exercise and weight loss.  Pulmonology  noted that nocturnal PAP therapy was a option if patient experiencing significant nocturnal hypoxia/shortness of breath. Functional  capacity is somewhat limited by  patient's age and multiple medical comorbidities.  With that said, patient is able to complete all of her ADL/IADLs without cardiovascular limitation.  Per the DASI, patient is able to achieve at least 4 METS of physical activity without experiencing any significant degree of angina/anginal equivalent symptoms. No changes were made to her medication regimen during her visit with cardiology.  Patient scheduled to follow-up with outpatient cardiology in 12 months or sooner if needed.  Tonya Cummings is scheduled for an elective RIGHT REVERSE SHOULDER ARTHROPLASTY, BICEPS TENODESIS (RIGHT: SHOULDER) on 08/30/2023 with Dr. Signa Kell, MD.  Given patient's past medical history significant for cardiovascular and cardiopulmonary diagnoses, presurgical clearances were sought by the PAT team.  Specialty clearances were obtained as follows.  Per pulmonary medicine Karna Christmas, MD), "this patient is optimized for surgery and may proceed with the planned procedural course with a LOW risk of significant perioperative cardiopulmonary complications".  Per cardiology (Monge, NP-C), "according to the Revised Cardiac Risk Index (RCRI), her Perioperative Risk of Major Cardiac Event is (%): 6.6. Her Functional Capacity in METs is: 5.04 according to the [employer] Activity Status Index (DASI).Therefore, based on ACC/AHA guidelines, patient would be at acceptable risk for the planned procedure without further cardiovascular testing".   In review of her medication reconciliation, the patient is not noted to be taking any type of anticoagulation or antiplatelet therapies that would need to be held during her perioperative course.  Patient denies previous perioperative complications with anesthesia in the past. In review of the available records, it is noted that patient  underwent a general anesthetic course here at [facility] ([facility] in 07/2023 without documented complications.      08/25/2023    2:04 PM 07/26/2023    8:59 AM 07/18/2023    2:53 PM  Vitals with BMI  Height 5\' 3"  5\' 3"    Weight 161 lbs 10 oz 165 lbs   BMI 28.63 29.24   Systolic 141 154 102  Diastolic 68 71 83  Pulse 88 102 90    Providers/Specialists:   NOTE: Primary physician provider listed below. Patient may have been seen by APP or partner within same practice.   PROVIDER ROLE / SPECIALTY LAST Sarina Ser, MD Orthopedics (Surgeon) 08/01/2023  Gauger, Hermenia Fiscal, NP Primary Care Provider 07/01/2023  Julien Nordmann, MD Cardiology 05/10/2023; update preop APP call on 08/16/2023  Vida Rigger, MD Pulmonary Medicine 08/01/2023  Owens Shark, MD Hematology 06/17/2023  Mady Haagensen, MD Nephrology 08/17/2023   Gerrie Nordmann, MD Rheumatology 07/12/2023  Wendall Mola, MD Endocrinology 03/22/2023   Allergies:  Gabapentin and Ibuprofen  Current Home Medications:   No current facility-administered medications for this encounter.    Acetaminophen-Caffeine (EXCEDRIN TENSION HEADACHE) 500-65 MG TABS   calcitRIOL (ROCALTROL) 0.25 MCG capsule   Calcium Carb-Cholecalciferol 600-400 MG-UNIT CAPS   Cholecalciferol 50 MCG (2000 UT) CAPS   cyclobenzaprine (FLEXERIL) 10 MG tablet   enalapril-hydrochlorothiazide (VASERETIC) 10-25 MG tablet   ferrous sulfate 325 (65 FE) MG tablet   furosemide (LASIX) 40 MG tablet   hydroxychloroquine (PLAQUENIL) 200 MG tablet   mirtazapine (REMERON) 30 MG tablet   Multiple Vitamins-Minerals (MULTIVITAMIN ADULT PO)   mupirocin ointment (BACTROBAN) 2 %   omeprazole (PRILOSEC) 40 MG capsule   Pirfenidone 267 MG TABS   pramipexole (MIRAPEX) 0.125 MG tablet   PROLIA 60 MG/ML SOSY injection   rosuvastatin (CRESTOR) 5 MG tablet   spironolactone (ALDACTONE) 25 MG tablet  Venlafaxine HCl 75 MG TB24   venlafaxine  XR (EFFEXOR-XR) 150 MG 24 hr capsule   vitamin B-12 (CYANOCOBALAMIN) 500 MCG tablet   History:   Past Medical History:  Diagnosis Date   Acute hypoxemic respiratory failure (HCC)    Anemia in stage 4 chronic kidney disease (HCC)    Anginal pain (HCC)    Anxiety    Aortic atherosclerosis (HCC)    CHF (congestive heart failure) (HCC)    a.) TTE 09/30/2021: EF 55-60%, no RWMAs, mild MR, G1DD   Chronic pain    Chronic radicular pain of lower back    CKD (chronic kidney disease), stage IV (HCC)    Complication of anesthesia    a.) delayed emergence following ureteroscopy 07/2023   Coronary artery calcification seen on CT scan    Costochondritis    DDD (degenerative disc disease), lumbar    Depression    Dyspnea    GERD (gastroesophageal reflux disease)    Hiatal hernia    Hip dysplasia    Hyperlipidemia    Hypertension    Insomnia    Iron deficiency    Iron deficiency anemia    Low back pain    Low vitamin B12 level    Lumbar facet joint pain    Migraines    Nose colonized with MRSA 10/03/2020   a.) preop PCR (+) 10/03/2020 prior to RIGHT TKA; b.) preop PCR (+) 08/25/2023 prior to RIGHT REVERSE SHOULDER ARTHROPLASTY; BICEPS TENODESIS   Numbness and tingling of right leg    OA (osteoarthritis)    Obesity    OSA (obstructive sleep apnea)    a.) not currently utilizing nocturnal PAP therapy; positional. PCCM feels as if symptom can be mitigated with diet/exercise/weight loss unless develops significant symptoms.   Osteoporosis    a.) recieves denosumab injections   Pelvic fracture (HCC)    Pneumonia    Pulmonary fibrosis (HCC)    Raynaud's disease without gangrene    Restless leg syndrome    a.) on pramipexole + oral Fe supplementation   Rheumatoid arthritis (HCC)    a.) Tx'd with hydroxychloroquine   Right renal mass 05/28/2023   a.) CT renal 05/28/2023:  5.7 cm mass in the medial aspect of right kidney   Seasonal allergies    Thoracic compression fracture [facility])     Past Surgical History:  Procedure Laterality Date   ABDOMINAL SURGERY     pt denies   APPENDECTOMY     CARPAL TUNNEL RELEASE Bilateral    CHOLECYSTECTOMY     COLONOSCOPY WITH PROPOFOL N/A 08/15/2020   Procedure: COLONOSCOPY WITH PROPOFOL;  Surgeon: Earline Mayotte, MD;  Location: [facility] ENDOSCOPY;  Service: Endoscopy;  Laterality: N/A;   CYSTOSCOPY W/ RETROGRADES Right 07/18/2023   Procedure: CYSTOSCOPY WITH RETROGRADE PYELOGRAM;  Surgeon: [name], MD;  Location: [facility] ORS;  Service: Urology;  Laterality: Right;   DILATION AND CURETTAGE OF UTERUS     ESOPHAGOGASTRODUODENOSCOPY (EGD) WITH PROPOFOL N/A 08/15/2020   Procedure: ESOPHAGOGASTRODUODENOSCOPY (EGD) WITH PROPOFOL;  Surgeon: Earline Mayotte, MD;  Location: [facility] ENDOSCOPY;  Service: Endoscopy;  Laterality: N/A;   FRACTURE SURGERY     hip fracture    FRACTURE SURGERY     pelvic fracture plate    HIP SURGERY Left    KNEE ARTHROPLASTY Right 10/13/2020   Procedure: COMPUTER ASSISTED TOTAL KNEE ARTHROPLASTY - RNFA;  Surgeon: Donato Heinz, MD;  Location: [facility] ORS;  Service: Orthopedics;  Laterality: Right;   TUBAL LIGATION  URETEROSCOPY  07/18/2023   Procedure: DIAGNOSTIC URETEROSCOPY;  Surgeon: [name], MD;  Location: [facility] ORS;  Service: Urology;;   Family History  Problem Relation Age of Onset   Diabetes Mother    Hypertension Mother    Aneurysm Father    Diabetes Son    Seizures Son    Osteosarcoma Brother    Cancer Brother    Diabetes Brother    Diabetes Maternal Grandfather    Heart disease Maternal Grandfather    Social History   Tobacco Use   Smoking status: Never   Smokeless tobacco: Never  Vaping Use   Vaping status: Never Used  Substance Use Topics   Alcohol use: No   Drug use: No    Pertinent Clinical Results:   Hospital Outpatient Visit on 08/25/2023  Component Date Value Ref Range Status   MRSA, PCR 08/25/2023 POSITIVE (A)  NEGATIVE Final   Comment: RESULT CALLED TO, READ BACK BY  AND VERIFIED WITH: Kristoph Sattler @1904  ON 08/25/23 SKL    Staphylococcus aureus 08/25/2023 POSITIVE (A)  NEGATIVE Final   Comment: (NOTE) The Xpert SA Assay (FDA approved for NASAL specimens in patients 65 years of age and older), is one component of a comprehensive surveillance program. It is not intended to diagnose infection nor to guide or monitor treatment. Performed at [facility], [address] [place], [place] [address]    WBC 08/25/2023 9.0  4.0 - 10.5 K/uL Final   RBC 08/25/2023 3.05 (L)  3.87 - 5.11 MIL/uL Final   Hemoglobin 08/25/2023 8.9 (L)  12.0 - 15.0 g/dL Final   HCT 60/45/4098 27.9 (L)  36.0 - 46.0 % Final   MCV 08/25/2023 91.5  80.0 - 100.0 fL Final   MCH 08/25/2023 29.2  26.0 - 34.0 pg Final   MCHC 08/25/2023 31.9  30.0 - 36.0 g/dL Final   RDW 11/91/4782 14.8  11.5 - 15.5 % Final   Platelets 08/25/2023 252  150 - 400 K/uL Final   nRBC 08/25/2023 0.0  0.0 - 0.2 % Final   Neutrophils Relative % 08/25/2023 72  % Final   Neutro Abs 08/25/2023 6.4  1.7 - 7.7 K/uL Final   Lymphocytes Relative 08/25/2023 16  % Final   Lymphs Abs 08/25/2023 1.4  0.7 - 4.0 K/uL Final   Monocytes Relative 08/25/2023 11  % Final   Monocytes Absolute 08/25/2023 1.0  0.1 - 1.0 K/uL Final   Eosinophils Relative 08/25/2023 1  % Final   Eosinophils Absolute 08/25/2023 0.1  0.0 - 0.5 K/uL Final   Basophils Relative 08/25/2023 0  % Final   Basophils Absolute 08/25/2023 0.0  0.0 - 0.1 K/uL Final   Immature Granulocytes 08/25/2023 0  % Final   Abs Immature Granulocytes 08/25/2023 0.04  0.00 - 0.07 K/uL Final   Performed at [facility], [address] [place], [place] [address]   Sodium 08/25/2023 138  135 - 145 mmol/L Final   Potassium 08/25/2023 3.7  3.5 - 5.1 mmol/L Final   Chloride 08/25/2023 97 (L)  98 - 111 mmol/L Final   CO2 08/25/2023 27  22 - 32 mmol/L Final   Glucose, Bld 08/25/2023 92  70 - 99 mg/dL Final   Glucose reference range applies only to samples taken  after fasting for at least 8 hours.   BUN 08/25/2023 63 (H)  8 - 23 mg/dL Final   Creatinine, Ser 08/25/2023 2.45 (H)  0.44 - 1.00 mg/dL Final   Calcium 30/86/5784 8.9  8.9 - 10.3 mg/dL Final   Total Protein 16/09/9603 7.6  6.5 - 8.1 g/dL Final   Albumin 54/08/8118 3.6  3.5 - 5.0 g/dL Final   AST 14/78/2956 33  15 - 41 U/L Final   ALT 08/25/2023 22  0 - 44 U/L Final   Alkaline Phosphatase 08/25/2023 69  38 - 126 U/L Final   Total Bilirubin 08/25/2023 0.4  0.3 - 1.2 mg/dL Final   GFR, Estimated 08/25/2023 21 (L)  >60 mL/min Final   Comment: (NOTE) Calculated using the CKD-EPI Creatinine Equation (2021)    Anion gap 08/25/2023 14  5 - 15 Final   Performed at [facility], [address] [place], [place] [address]   Color, Urine 08/25/2023 STRAW (A)  YELLOW Final   APPearance 08/25/2023 HAZY (A)  CLEAR Final   Specific Gravity, Urine 08/25/2023 1.010  1.005 - 1.030 Final   pH 08/25/2023 5.0  5.0 - 8.0 Final   Glucose, UA 08/25/2023 NEGATIVE  NEGATIVE mg/dL Final   Hgb urine dipstick 08/25/2023 NEGATIVE  NEGATIVE Final   Bilirubin Urine 08/25/2023 NEGATIVE  NEGATIVE Final   Ketones, ur 08/25/2023 NEGATIVE  NEGATIVE mg/dL Final   Protein, ur 65/78/4696 NEGATIVE  NEGATIVE mg/dL Final   Nitrite 29/52/8413 NEGATIVE  NEGATIVE Final   Leukocytes,Ua 08/25/2023 SMALL (A)  NEGATIVE Final   RBC / HPF 08/25/2023 0-5  0 - 5 RBC/hpf Final   WBC, UA 08/25/2023 6-10  0 - 5 WBC/hpf Final   Bacteria, UA 08/25/2023 RARE (A)  NONE SEEN Final   Squamous Epithelial / HPF 08/25/2023 0-5  0 - 5 /HPF Final   Mucus 08/25/2023 PRESENT   Final   Hyaline Casts, UA 08/25/2023 PRESENT   Final   Performed at [facility], [address] [place], [place] [address]   ABO/RH(D) 08/25/2023 O POS   Final   Antibody Screen 08/25/2023 NEG   Final   Sample Expiration 08/25/2023 09/08/2023,2359   Final   Extend sample reason 08/25/2023    Final                   Value:NO TRANSFUSIONS OR PREGNANCY IN  THE PAST 3 MONTHS Performed at [facility], [address] [place], [place] [address]     ECG: Date: 05/28/2023 Time ECG obtained: 1148 AM Rate: 89 bpm Rhythm: normal sinus Axis (leads I and aVF): Normal Intervals: PR 162 ms. QRS 94 ms. QTc 452 ms. ST segment and T wave changes: Inferolateral ST and T wave abnormalities    IMAGING / PROCEDURES: CT SHOULDER RIGHT WO CONTRAST performed on 08/11/2023 No acute osseous injury of the right shoulder. Mild osteoarthritis of the glenohumeral joint. Mild arthropathy of the acromioclavicular joint. Moderate atrophy of the supraspinatus muscle. Mild atrophy of the infraspinatus muscle. Rotator cuff is limited in evaluation by CT in this is better evaluated on the MRI of the shoulder dated 07/07/2023 demonstrating a large full-thickness tear of the supraspinatus and infraspinatus tendons. Partially visualized right upper and lower lung pulmonary nodules with the largest in the right lower lobe measuring 5 mm. These are better evaluated on the CT of the chest dated 07/15/2023.  CT CHEST WO CONTRAST performed on 08/02/204 Stable bilateral pulmonary nodules since the prior CT scan from over 1 year ago and although metastatic disease cannot be excluded given the patient's right renal mass I would expect some change if this was metastatic disease and favor benign process. No mediastinal or hilar mass or adenopathy.  Stable large hiatal hernia. Stable large right renal mass.  PULMONARY FUNCTION TESTING performed on 04/07/2023 04/07/2023- FEV1 - 1.52L- 71%  08/03/2022- FEV1- 1.16L - 51% 03/18/2022- FEV1 - 1.19L - 55% 10/26/2021- FEV1- 0.88L- 39%  MR SHOULDER RIGHT WO CONTRAST performed on 07/07/2023 Motion artifact markedly limits evaluation, however a massive full-thickness tear of the majority of the supraspinatus and infraspinatus tendon insertions is favored. High-riding humeral head. High-grade supraspinatus and moderate anterior  greater than posterior infraspinatus muscle atrophy and fatty infiltration. Mild degenerative changes of the acromioclavicular joint. Type III acromion, with mild downsloping of the anterolateral acromion. Mild glenohumeral joint effusion extending into the subacromial/subdeltoid bursa.  MR ABDOMEN W WO CONTRAST performed on 06/09/2023 Mildly motion degraded exam. Central interpolar right renal heterogeneously enhancing mass is consistent with neoplasm. Favor central renal cell carcinoma over urothelial carcinoma. No renal vein involvement or abdominal adenopathy. Subtle T2 hyperintense, arterially hyperenhancing liver lesions are indeterminate, especially given small size and motion. These most likely represent hemangiomas. Early metastasis could look similar. Consider pre and post contrast abdominal MRI follow-up at 3-6 months Right-sided approximately T12 focus of hyperenhancement is favored to be degenerative. Recommend attention on follow-up to exclude isolated osseous metastasis. Moderate hiatal hernia  Aortic atherosclerosis  Thoracolumbar compression deformities.  CT RENAL STONE STUDY performed on 05/28/2023 There is 5.7 cm mass in the medial aspect of right kidney. Evaluation is limited without intravenous contrast. Possibility of malignant neoplasm in right kidney is not excluded. Follow-up contrast enhanced CT, PET-CT and tissue sampling as warranted should be considered. There is no hydronephrosis. Possible 1.2 cm cyst in the lower pole of right kidney. There is focal alveolar infiltrate in the posteromedial left lower lung fields suggesting pneumonia in left lower lobe. There are scattered patchy faint ground-glass densities in the lower lung fields suggesting scarring or interstitial pneumonia. There are multiple noncalcified nodules in both lower lung fields measuring up to 8 mm in size. Similar findings were seen in previous CT chest suggesting possible granulomas. There is no  evidence of intestinal obstruction or pneumoperitoneum. Moderate to large fixed hiatal hernia. There is decrease in height of bodies of T11 and L1 vertebrae with no significant interval change suggesting old compression fractures. Degenerative changes are noted in lumbar spine. Arteriosclerosis.   Coronary artery disease.  TRANSTHORACIC ECHOCARDIOGRAM performed on 09/30/2021 Left ventricular ejection fraction, by estimation, is 55 to 60%. The  left ventricle has normal function. The left ventricle has no regional  wall motion abnormalities. Left ventricular diastolic parameters are  consistent with Grade I diastolic  dysfunction (impaired relaxation).  Right ventricular systolic function is normal. The right ventricular size is normal.  The mitral valve is normal in structure. Mild mitral valve regurgitation.  The aortic valve is normal in structure. Aortic valve regurgitation is not visualized.   Impression and Plan:  Tonya Cummings has been referred for pre-anesthesia review and clearance prior to her undergoing the planned anesthetic and procedural courses. Available labs, pertinent testing, and imaging results were personally reviewed by me in preparation for upcoming operative/procedural course. [facility] medical record has been updated following extensive record review and patient interview with PAT staff.   This patient has been appropriately cleared by cardiology (ACCEPTABLE) and by pulmonary medicine (LOW) with the individually indicated risk of experiencing significant perioperative cardiovascular complications. Based on clinical review performed today (08/29/23), barring any significant acute changes in the patient's overall condition, it is anticipated that she will be able to proceed with the planned  surgical intervention. Any acute changes in clinical condition may necessitate her procedure being postponed and/or cancelled. Patient will meet with anesthesia team (MD and/or CRNA) on  the day of her procedure for preoperative evaluation/assessment. Questions regarding anesthetic course will be fielded at that time.   Pre-surgical instructions were reviewed with the patient during her PAT appointment, and questions were fielded to satisfaction by PAT clinical staff. She has been instructed on which medications that she will need to hold prior to surgery, as well as the ones that have been deemed safe/appropriate to take on the day of her procedure. As part of the general education provided by PAT, patient made aware both verbally and in writing, that she would need to abstain from the use of any illegal substances during her perioperative course.  She was advised that failure to follow the provided instructions could necessitate case cancellation or result in serious perioperative complications up to and including death. Patient encouraged to contact PAT and/or her surgeon's office to discuss any questions or concerns that may arise prior to surgery; verbalized understanding.   Quentin Mulling, MSN, APRN, FNP-C, CEN [facility]  Peri-operative Services Nurse Practitioner Phone: [phone] Fax: [phone] 08/29/23 12:14 PM  NOTE: This note has been prepared using Dragon dictation software. Despite my best ability to proofread, there is always the potential that unintentional transcriptional errors may still occur from this process.

## 2023-08-30 ENCOUNTER — Ambulatory Visit: Payer: Medicare HMO | Admitting: Urgent Care

## 2023-08-30 ENCOUNTER — Encounter
Admission: RE | Disposition: A | Discharge status: Home Health | Payer: Self-pay | Source: Home / Self Care | Attending: Osteopathic Medicine

## 2023-08-30 ENCOUNTER — Inpatient Hospital Stay
Admission: RE | Admit: 2023-08-30 | Discharge: 2023-09-02 | DRG: 483 | Disposition: A | Discharge status: Home Health | Payer: Medicare HMO | Attending: Osteopathic Medicine | Admitting: Osteopathic Medicine

## 2023-08-30 ENCOUNTER — Other Ambulatory Visit: Payer: Self-pay

## 2023-08-30 ENCOUNTER — Ambulatory Visit: Payer: Medicare HMO

## 2023-08-30 ENCOUNTER — Observation Stay: Payer: Medicare HMO

## 2023-08-30 ENCOUNTER — Encounter: Payer: Medicare HMO | Admitting: Orthopedic Surgery

## 2023-08-30 ENCOUNTER — Encounter: Payer: Self-pay | Admitting: Orthopedic Surgery

## 2023-08-30 DIAGNOSIS — M79605 Pain in left leg: Secondary | ICD-10-CM | POA: Diagnosis not present

## 2023-08-30 DIAGNOSIS — Z79899 Other long term (current) drug therapy: Secondary | ICD-10-CM

## 2023-08-30 DIAGNOSIS — G2581 Restless legs syndrome: Secondary | ICD-10-CM | POA: Diagnosis present

## 2023-08-30 DIAGNOSIS — I5033 Acute on chronic diastolic (congestive) heart failure: Secondary | ICD-10-CM | POA: Diagnosis present

## 2023-08-30 DIAGNOSIS — D84821 Immunodeficiency due to drugs: Secondary | ICD-10-CM | POA: Diagnosis present

## 2023-08-30 DIAGNOSIS — M7521 Bicipital tendinitis, right shoulder: Secondary | ICD-10-CM | POA: Diagnosis not present

## 2023-08-30 DIAGNOSIS — Q6589 Other specified congenital deformities of hip: Secondary | ICD-10-CM

## 2023-08-30 DIAGNOSIS — I130 Hypertensive heart and chronic kidney disease with heart failure and stage 1 through stage 4 chronic kidney disease, or unspecified chronic kidney disease: Secondary | ICD-10-CM | POA: Diagnosis present

## 2023-08-30 DIAGNOSIS — Z471 Aftercare following joint replacement surgery: Secondary | ICD-10-CM | POA: Diagnosis not present

## 2023-08-30 DIAGNOSIS — M5410 Radiculopathy, site unspecified: Secondary | ICD-10-CM | POA: Diagnosis present

## 2023-08-30 DIAGNOSIS — R0600 Dyspnea, unspecified: Secondary | ICD-10-CM | POA: Diagnosis present

## 2023-08-30 DIAGNOSIS — Z96651 Presence of right artificial knee joint: Secondary | ICD-10-CM | POA: Diagnosis present

## 2023-08-30 DIAGNOSIS — Z96611 Presence of right artificial shoulder joint: Secondary | ICD-10-CM | POA: Diagnosis not present

## 2023-08-30 DIAGNOSIS — I509 Heart failure, unspecified: Secondary | ICD-10-CM | POA: Diagnosis not present

## 2023-08-30 DIAGNOSIS — Z85528 Personal history of other malignant neoplasm of kidney: Secondary | ICD-10-CM

## 2023-08-30 DIAGNOSIS — S46001A Unspecified injury of muscle(s) and tendon(s) of the rotator cuff of right shoulder, initial encounter: Secondary | ICD-10-CM | POA: Diagnosis not present

## 2023-08-30 DIAGNOSIS — M75101 Unspecified rotator cuff tear or rupture of right shoulder, not specified as traumatic: Principal | ICD-10-CM | POA: Diagnosis present

## 2023-08-30 DIAGNOSIS — G4733 Obstructive sleep apnea (adult) (pediatric): Secondary | ICD-10-CM | POA: Insufficient documentation

## 2023-08-30 DIAGNOSIS — F32A Depression, unspecified: Secondary | ICD-10-CM | POA: Diagnosis present

## 2023-08-30 DIAGNOSIS — D72829 Elevated white blood cell count, unspecified: Secondary | ICD-10-CM | POA: Insufficient documentation

## 2023-08-30 DIAGNOSIS — D509 Iron deficiency anemia, unspecified: Secondary | ICD-10-CM | POA: Diagnosis present

## 2023-08-30 DIAGNOSIS — I471 Supraventricular tachycardia, unspecified: Secondary | ICD-10-CM | POA: Diagnosis not present

## 2023-08-30 DIAGNOSIS — I2510 Atherosclerotic heart disease of native coronary artery without angina pectoris: Secondary | ICD-10-CM | POA: Diagnosis present

## 2023-08-30 DIAGNOSIS — N2889 Other specified disorders of kidney and ureter: Secondary | ICD-10-CM

## 2023-08-30 DIAGNOSIS — J84112 Idiopathic pulmonary fibrosis: Secondary | ICD-10-CM | POA: Diagnosis present

## 2023-08-30 DIAGNOSIS — M059 Rheumatoid arthritis with rheumatoid factor, unspecified: Secondary | ICD-10-CM | POA: Diagnosis present

## 2023-08-30 DIAGNOSIS — Z886 Allergy status to analgesic agent status: Secondary | ICD-10-CM

## 2023-08-30 DIAGNOSIS — R54 Age-related physical debility: Secondary | ICD-10-CM | POA: Diagnosis present

## 2023-08-30 DIAGNOSIS — M19011 Primary osteoarthritis, right shoulder: Secondary | ICD-10-CM | POA: Diagnosis not present

## 2023-08-30 DIAGNOSIS — J690 Pneumonitis due to inhalation of food and vomit: Secondary | ICD-10-CM | POA: Diagnosis present

## 2023-08-30 DIAGNOSIS — Z6829 Body mass index (BMI) 29.0-29.9, adult: Secondary | ICD-10-CM

## 2023-08-30 DIAGNOSIS — G8929 Other chronic pain: Secondary | ICD-10-CM | POA: Diagnosis present

## 2023-08-30 DIAGNOSIS — I10 Essential (primary) hypertension: Secondary | ICD-10-CM | POA: Diagnosis present

## 2023-08-30 DIAGNOSIS — M79604 Pain in right leg: Secondary | ICD-10-CM | POA: Diagnosis not present

## 2023-08-30 DIAGNOSIS — D631 Anemia in chronic kidney disease: Secondary | ICD-10-CM | POA: Diagnosis present

## 2023-08-30 DIAGNOSIS — E785 Hyperlipidemia, unspecified: Secondary | ICD-10-CM | POA: Diagnosis present

## 2023-08-30 DIAGNOSIS — G43909 Migraine, unspecified, not intractable, without status migrainosus: Secondary | ICD-10-CM | POA: Diagnosis present

## 2023-08-30 DIAGNOSIS — R7989 Other specified abnormal findings of blood chemistry: Secondary | ICD-10-CM | POA: Insufficient documentation

## 2023-08-30 DIAGNOSIS — Z833 Family history of diabetes mellitus: Secondary | ICD-10-CM

## 2023-08-30 DIAGNOSIS — I2489 Other forms of acute ischemic heart disease: Secondary | ICD-10-CM | POA: Diagnosis present

## 2023-08-30 DIAGNOSIS — J9601 Acute respiratory failure with hypoxia: Principal | ICD-10-CM | POA: Diagnosis present

## 2023-08-30 DIAGNOSIS — R079 Chest pain, unspecified: Secondary | ICD-10-CM | POA: Diagnosis not present

## 2023-08-30 DIAGNOSIS — R918 Other nonspecific abnormal finding of lung field: Secondary | ICD-10-CM | POA: Diagnosis not present

## 2023-08-30 DIAGNOSIS — N184 Chronic kidney disease, stage 4 (severe): Secondary | ICD-10-CM | POA: Diagnosis present

## 2023-08-30 DIAGNOSIS — Z8249 Family history of ischemic heart disease and other diseases of the circulatory system: Secondary | ICD-10-CM

## 2023-08-30 DIAGNOSIS — R0989 Other specified symptoms and signs involving the circulatory and respiratory systems: Secondary | ICD-10-CM | POA: Diagnosis not present

## 2023-08-30 DIAGNOSIS — M7989 Other specified soft tissue disorders: Secondary | ICD-10-CM | POA: Diagnosis not present

## 2023-08-30 DIAGNOSIS — M810 Age-related osteoporosis without current pathological fracture: Secondary | ICD-10-CM | POA: Diagnosis present

## 2023-08-30 DIAGNOSIS — M12819 Other specific arthropathies, not elsewhere classified, unspecified shoulder: Secondary | ICD-10-CM | POA: Diagnosis present

## 2023-08-30 DIAGNOSIS — R0609 Other forms of dyspnea: Secondary | ICD-10-CM | POA: Diagnosis not present

## 2023-08-30 DIAGNOSIS — N183 Chronic kidney disease, stage 3 unspecified: Secondary | ICD-10-CM | POA: Diagnosis not present

## 2023-08-30 DIAGNOSIS — Z01810 Encounter for preprocedural cardiovascular examination: Secondary | ICD-10-CM | POA: Diagnosis not present

## 2023-08-30 DIAGNOSIS — R0602 Shortness of breath: Secondary | ICD-10-CM | POA: Diagnosis not present

## 2023-08-30 DIAGNOSIS — F419 Anxiety disorder, unspecified: Secondary | ICD-10-CM | POA: Diagnosis present

## 2023-08-30 DIAGNOSIS — F418 Other specified anxiety disorders: Secondary | ICD-10-CM | POA: Diagnosis present

## 2023-08-30 DIAGNOSIS — G8918 Other acute postprocedural pain: Secondary | ICD-10-CM | POA: Diagnosis not present

## 2023-08-30 DIAGNOSIS — I7300 Raynaud's syndrome without gangrene: Secondary | ICD-10-CM | POA: Diagnosis present

## 2023-08-30 DIAGNOSIS — Z808 Family history of malignant neoplasm of other organs or systems: Secondary | ICD-10-CM

## 2023-08-30 DIAGNOSIS — Z7960 Long term (current) use of unspecified immunomodulators and immunosuppressants: Secondary | ICD-10-CM

## 2023-08-30 DIAGNOSIS — Z9189 Other specified personal risk factors, not elsewhere classified: Secondary | ICD-10-CM

## 2023-08-30 DIAGNOSIS — Z86718 Personal history of other venous thrombosis and embolism: Secondary | ICD-10-CM

## 2023-08-30 DIAGNOSIS — M12811 Other specific arthropathies, not elsewhere classified, right shoulder: Secondary | ICD-10-CM | POA: Diagnosis present

## 2023-08-30 DIAGNOSIS — K219 Gastro-esophageal reflux disease without esophagitis: Secondary | ICD-10-CM | POA: Diagnosis present

## 2023-08-30 HISTORY — PX: REVERSE SHOULDER ARTHROPLASTY: SHX5054

## 2023-08-30 SURGERY — ARTHROPLASTY, SHOULDER, TOTAL, REVERSE
Anesthesia: General | Site: Shoulder | Laterality: Right

## 2023-08-30 MED ORDER — CEFAZOLIN SODIUM-DEXTROSE 2-4 GM/100ML-% IV SOLN
2.0000 g | Freq: Two times a day (BID) | INTRAVENOUS | Status: AC
  Administered 2023-08-30 – 2023-08-31 (×2): 2 g via INTRAVENOUS

## 2023-08-30 MED ORDER — FUROSEMIDE 20 MG PO TABS
ORAL_TABLET | ORAL | Status: AC
  Filled 2023-08-30: qty 3

## 2023-08-30 MED ORDER — VENLAFAXINE HCL ER 75 MG PO CP24
225.0000 mg | ORAL_CAPSULE | Freq: Every day | ORAL | Status: DC
  Administered 2023-08-31 – 2023-09-02 (×3): 225 mg via ORAL
  Filled 2023-08-30 (×3): qty 3

## 2023-08-30 MED ORDER — BISACODYL 10 MG RE SUPP: 10.0000 mg | Freq: Every day | RECTAL | Status: DC | PRN

## 2023-08-30 MED ORDER — HYDROXYCHLOROQUINE SULFATE 200 MG PO TABS
200.0000 mg | ORAL_TABLET | Freq: Two times a day (BID) | ORAL | Status: DC
  Administered 2023-08-30 – 2023-09-02 (×6): 200 mg via ORAL
  Filled 2023-08-30 (×6): qty 1

## 2023-08-30 MED ORDER — PANTOPRAZOLE SODIUM 40 MG PO TBEC
DELAYED_RELEASE_TABLET | ORAL | Status: AC
  Filled 2023-08-30: qty 2

## 2023-08-30 MED ORDER — ACETAMINOPHEN 500 MG PO TABS
ORAL_TABLET | ORAL | Status: AC
  Filled 2023-08-30: qty 2

## 2023-08-30 MED ORDER — MENTHOL 3 MG MT LOZG: 1.0000 | LOZENGE | OROMUCOSAL | Status: DC | PRN

## 2023-08-30 MED ORDER — BUPIVACAINE LIPOSOME 1.3 % IJ SUSP
INTRAMUSCULAR | Status: DC | PRN
  Administered 2023-08-30: 10 mL via PERINEURAL

## 2023-08-30 MED ORDER — VANCOMYCIN HCL 1000 MG IV SOLR
INTRAVENOUS | Status: DC | PRN
  Administered 2023-08-30: 1000 mg via TOPICAL

## 2023-08-30 MED ORDER — DOCUSATE SODIUM 100 MG PO CAPS
ORAL_CAPSULE | ORAL | Status: AC
  Filled 2023-08-30: qty 1

## 2023-08-30 MED ORDER — PIRFENIDONE 267 MG PO TABS: 3.0000 | ORAL_TABLET | Freq: Three times a day (TID) | ORAL | Status: DC

## 2023-08-30 MED ORDER — ONDANSETRON HCL 4 MG/2ML IJ SOLN: 4.0000 mg | Freq: Four times a day (QID) | INTRAMUSCULAR | Status: DC | PRN

## 2023-08-30 MED ORDER — LIDOCAINE HCL (CARDIAC) PF 100 MG/5ML IV SOSY
PREFILLED_SYRINGE | INTRAVENOUS | Status: DC | PRN
  Administered 2023-08-30: 40 mg via INTRAVENOUS

## 2023-08-30 MED ORDER — SPIRONOLACTONE 25 MG PO TABS
25.0000 mg | ORAL_TABLET | Freq: Every morning | ORAL | Status: DC
  Administered 2023-08-30 – 2023-09-02 (×4): 25 mg via ORAL
  Filled 2023-08-30 (×4): qty 1

## 2023-08-30 MED ORDER — HYDROCHLOROTHIAZIDE 25 MG PO TABS
25.0000 mg | ORAL_TABLET | Freq: Every day | ORAL | Status: DC
  Administered 2023-08-31 – 2023-09-02 (×3): 25 mg via ORAL
  Filled 2023-08-30 (×2): qty 1

## 2023-08-30 MED ORDER — PRAMIPEXOLE DIHYDROCHLORIDE 0.25 MG PO TABS
0.1250 mg | ORAL_TABLET | Freq: Every day | ORAL | Status: DC
  Administered 2023-08-31 – 2023-09-02 (×3): 0.125 mg via ORAL
  Filled 2023-08-30 (×3): qty 0.5

## 2023-08-30 MED ORDER — ORAL CARE MOUTH RINSE: 15.0000 mL | Freq: Once | OROMUCOSAL | Status: AC

## 2023-08-30 MED ORDER — ONDANSETRON HCL 4 MG PO TABS: 4.0000 mg | ORAL_TABLET | Freq: Four times a day (QID) | ORAL | Status: DC | PRN

## 2023-08-30 MED ORDER — ALUM & MAG HYDROXIDE-SIMETH 200-200-20 MG/5ML PO SUSP: 30.0000 mL | ORAL | Status: DC | PRN

## 2023-08-30 MED ORDER — CHLORHEXIDINE GLUCONATE 0.12 % MT SOLN
15.0000 mL | Freq: Once | OROMUCOSAL | Status: AC
  Administered 2023-08-30: 15 mL via OROMUCOSAL

## 2023-08-30 MED ORDER — METOCLOPRAMIDE HCL 10 MG PO TABS: 5.0000 mg | ORAL_TABLET | Freq: Three times a day (TID) | ORAL | Status: DC | PRN

## 2023-08-30 MED ORDER — ENALAPRIL-HYDROCHLOROTHIAZIDE 10-25 MG PO TABS: 1.0000 | ORAL_TABLET | Freq: Every day | ORAL | Status: DC

## 2023-08-30 MED ORDER — PHENOL 1.4 % MT LIQD: 1.0000 | OROMUCOSAL | Status: DC | PRN

## 2023-08-30 MED ORDER — PHENYLEPHRINE HCL-NACL 20-0.9 MG/250ML-% IV SOLN
INTRAVENOUS | Status: DC | PRN
Start: 2023-08-30 — End: 2023-08-30
  Administered 2023-08-30: 40 ug/min via INTRAVENOUS

## 2023-08-30 MED ORDER — VENLAFAXINE HCL ER 75 MG PO TB24: 1.0000 | ORAL_TABLET | Freq: Every morning | ORAL | Status: DC

## 2023-08-30 MED ORDER — CYCLOBENZAPRINE HCL 10 MG PO TABS
10.0000 mg | ORAL_TABLET | Freq: Every day | ORAL | Status: DC
  Administered 2023-08-30: 10 mg via ORAL
  Filled 2023-08-30: qty 1

## 2023-08-30 MED ORDER — METOCLOPRAMIDE HCL 5 MG/ML IJ SOLN: 5.0000 mg | Freq: Three times a day (TID) | INTRAMUSCULAR | Status: DC | PRN

## 2023-08-30 MED ORDER — SEVOFLURANE IN SOLN
RESPIRATORY_TRACT | Status: AC
  Filled 2023-08-30: qty 250

## 2023-08-30 MED ORDER — OXYCODONE HCL 5 MG PO TABS: 5.0000 mg | ORAL_TABLET | Freq: Once | ORAL | Status: DC | PRN

## 2023-08-30 MED ORDER — CEFAZOLIN SODIUM-DEXTROSE 2-4 GM/100ML-% IV SOLN
INTRAVENOUS | Status: AC
  Filled 2023-08-30: qty 100

## 2023-08-30 MED ORDER — ROCURONIUM BROMIDE 100 MG/10ML IV SOLN
INTRAVENOUS | Status: DC | PRN
  Administered 2023-08-30: 30 mg via INTRAVENOUS

## 2023-08-30 MED ORDER — CEFAZOLIN SODIUM-DEXTROSE 2-4 GM/100ML-% IV SOLN
2.0000 g | INTRAVENOUS | Status: AC
  Administered 2023-08-30: 2 g via INTRAVENOUS

## 2023-08-30 MED ORDER — SUGAMMADEX SODIUM 200 MG/2ML IV SOLN
INTRAVENOUS | Status: DC | PRN
  Administered 2023-08-30: 200 mg via INTRAVENOUS

## 2023-08-30 MED ORDER — VITAMIN D 25 MCG (1000 UNIT) PO TABS
2000.0000 [IU] | ORAL_TABLET | Freq: Every morning | ORAL | Status: DC
  Administered 2023-08-30 – 2023-09-02 (×4): 2000 [IU] via ORAL
  Filled 2023-08-30 (×2): qty 2

## 2023-08-30 MED ORDER — CALCITRIOL 0.25 MCG PO CAPS
0.2500 ug | ORAL_CAPSULE | Freq: Every morning | ORAL | Status: DC
  Administered 2023-08-31 – 2023-09-02 (×3): 0.25 ug via ORAL
  Filled 2023-08-30 (×3): qty 1

## 2023-08-30 MED ORDER — FENTANYL CITRATE (PF) 100 MCG/2ML IJ SOLN
INTRAMUSCULAR | Status: DC | PRN
  Administered 2023-08-30: 50 ug via INTRAVENOUS
  Administered 2023-08-30 (×2): 25 ug via INTRAVENOUS

## 2023-08-30 MED ORDER — SODIUM CHLORIDE 0.9 % IR SOLN
Status: DC | PRN
  Administered 2023-08-30: 1000 mL

## 2023-08-30 MED ORDER — DEXAMETHASONE SODIUM PHOSPHATE 10 MG/ML IJ SOLN
INTRAMUSCULAR | Status: DC | PRN
  Administered 2023-08-30: 10 mg via INTRAVENOUS

## 2023-08-30 MED ORDER — FENTANYL CITRATE (PF) 100 MCG/2ML IJ SOLN: 25.0000 ug | INTRAMUSCULAR | Status: DC | PRN

## 2023-08-30 MED ORDER — FERROUS SULFATE 325 (65 FE) MG PO TABS
325.0000 mg | ORAL_TABLET | Freq: Every day | ORAL | Status: DC
  Administered 2023-08-31 – 2023-09-02 (×3): 325 mg via ORAL
  Filled 2023-08-30 (×2): qty 1

## 2023-08-30 MED ORDER — FUROSEMIDE 20 MG PO TABS
60.0000 mg | ORAL_TABLET | Freq: Every morning | ORAL | Status: DC
  Administered 2023-08-30 – 2023-08-31 (×2): 60 mg via ORAL

## 2023-08-30 MED ORDER — SUCCINYLCHOLINE CHLORIDE 200 MG/10ML IV SOSY
PREFILLED_SYRINGE | INTRAVENOUS | Status: DC | PRN
  Administered 2023-08-30: 80 mg via INTRAVENOUS

## 2023-08-30 MED ORDER — 0.9 % SODIUM CHLORIDE (POUR BTL) OPTIME
TOPICAL | Status: DC | PRN
  Administered 2023-08-30: 500 mL

## 2023-08-30 MED ORDER — DEXAMETHASONE SODIUM PHOSPHATE 10 MG/ML IJ SOLN
INTRAMUSCULAR | Status: AC
  Filled 2023-08-30: qty 1

## 2023-08-30 MED ORDER — MIDAZOLAM HCL 2 MG/2ML IJ SOLN: INTRAMUSCULAR | Status: DC | PRN

## 2023-08-30 MED ORDER — ENALAPRIL MALEATE 10 MG PO TABS
10.0000 mg | ORAL_TABLET | Freq: Every day | ORAL | Status: DC
  Administered 2023-08-31 – 2023-09-02 (×3): 10 mg via ORAL
  Filled 2023-08-30 (×3): qty 1

## 2023-08-30 MED ORDER — VITAMIN B-12 1000 MCG PO TABS
500.0000 ug | ORAL_TABLET | Freq: Every morning | ORAL | Status: DC
  Administered 2023-08-31 – 2023-09-02 (×3): 500 ug via ORAL
  Filled 2023-08-30 (×3): qty 1

## 2023-08-30 MED ORDER — PHENYLEPHRINE HCL-NACL 20-0.9 MG/250ML-% IV SOLN
INTRAVENOUS | Status: AC
  Filled 2023-08-30: qty 250

## 2023-08-30 MED ORDER — PROPOFOL 10 MG/ML IV BOLUS
INTRAVENOUS | Status: AC
  Filled 2023-08-30: qty 20

## 2023-08-30 MED ORDER — MIRTAZAPINE 15 MG PO TABS
30.0000 mg | ORAL_TABLET | Freq: Every day | ORAL | Status: DC
  Administered 2023-08-30 – 2023-09-01 (×3): 30 mg via ORAL
  Filled 2023-08-30: qty 2
  Filled 2023-08-30: qty 1
  Filled 2023-08-30: qty 2

## 2023-08-30 MED ORDER — VITAMIN D 25 MCG (1000 UNIT) PO TABS
ORAL_TABLET | ORAL | Status: AC
  Filled 2023-08-30: qty 2

## 2023-08-30 MED ORDER — CHLORHEXIDINE GLUCONATE 0.12 % MT SOLN
OROMUCOSAL | Status: AC
  Filled 2023-08-30: qty 15

## 2023-08-30 MED ORDER — PRAMIPEXOLE DIHYDROCHLORIDE 0.25 MG PO TABS
0.2500 mg | ORAL_TABLET | Freq: Every day | ORAL | Status: DC
  Administered 2023-08-30 – 2023-09-01 (×3): 0.25 mg via ORAL
  Filled 2023-08-30 (×3): qty 1

## 2023-08-30 MED ORDER — SODIUM CHLORIDE 0.9 % IV SOLN: INTRAVENOUS | Status: DC

## 2023-08-30 MED ORDER — ASPIRIN 325 MG PO TBEC
325.0000 mg | DELAYED_RELEASE_TABLET | Freq: Every day | ORAL | Status: DC
  Administered 2023-08-31 – 2023-09-02 (×2): 325 mg via ORAL
  Filled 2023-08-30 (×2): qty 1

## 2023-08-30 MED ORDER — PANTOPRAZOLE SODIUM 40 MG PO TBEC
80.0000 mg | DELAYED_RELEASE_TABLET | Freq: Every day | ORAL | Status: DC
  Administered 2023-08-30 – 2023-09-02 (×4): 80 mg via ORAL
  Filled 2023-08-30 (×2): qty 2

## 2023-08-30 MED ORDER — VANCOMYCIN HCL IN DEXTROSE 1-5 GM/200ML-% IV SOLN
INTRAVENOUS | Status: AC
  Filled 2023-08-30: qty 200

## 2023-08-30 MED ORDER — OXYCODONE HCL 5 MG PO TABS: 10.0000 mg | ORAL_TABLET | ORAL | Status: DC | PRN

## 2023-08-30 MED ORDER — MUPIROCIN 2 % EX OINT
TOPICAL_OINTMENT | Freq: Two times a day (BID) | CUTANEOUS | Status: DC
  Filled 2023-08-30: qty 22

## 2023-08-30 MED ORDER — ACETAMINOPHEN 10 MG/ML IV SOLN: 1000.0000 mg | Freq: Once | INTRAVENOUS | Status: DC | PRN

## 2023-08-30 MED ORDER — SENNOSIDES-DOCUSATE SODIUM 8.6-50 MG PO TABS: 1.0000 | ORAL_TABLET | Freq: Every evening | ORAL | Status: DC | PRN

## 2023-08-30 MED ORDER — OXYCODONE HCL 5 MG PO TABS: 5.0000 mg | ORAL_TABLET | ORAL | Status: DC | PRN

## 2023-08-30 MED ORDER — DROPERIDOL 2.5 MG/ML IJ SOLN: 0.6250 mg | Freq: Once | INTRAMUSCULAR | Status: DC | PRN

## 2023-08-30 MED ORDER — DOCUSATE SODIUM 100 MG PO CAPS
100.0000 mg | ORAL_CAPSULE | Freq: Two times a day (BID) | ORAL | Status: DC
  Administered 2023-08-30 – 2023-09-02 (×6): 100 mg via ORAL
  Filled 2023-08-30 (×4): qty 1

## 2023-08-30 MED ORDER — TRANEXAMIC ACID-NACL 1000-0.7 MG/100ML-% IV SOLN
1000.0000 mg | INTRAVENOUS | Status: AC
  Administered 2023-08-30: 1000 mg via INTRAVENOUS

## 2023-08-30 MED ORDER — TRANEXAMIC ACID-NACL 1000-0.7 MG/100ML-% IV SOLN
INTRAVENOUS | Status: AC
  Filled 2023-08-30: qty 100

## 2023-08-30 MED ORDER — ACETAMINOPHEN 500 MG PO TABS
1000.0000 mg | ORAL_TABLET | Freq: Three times a day (TID) | ORAL | Status: AC
  Administered 2023-08-30 – 2023-08-31 (×4): 1000 mg via ORAL

## 2023-08-30 MED ORDER — BUPIVACAINE HCL (PF) 0.5 % IJ SOLN
INTRAMUSCULAR | Status: DC | PRN
  Administered 2023-08-30: 10 mL via PERINEURAL

## 2023-08-30 MED ORDER — PHENYLEPHRINE HCL (PRESSORS) 10 MG/ML IV SOLN
INTRAVENOUS | Status: DC | PRN
  Administered 2023-08-30: 80 ug via INTRAVENOUS
  Administered 2023-08-30: 160 ug via INTRAVENOUS
  Administered 2023-08-30: 80 ug via INTRAVENOUS

## 2023-08-30 MED ORDER — MIDAZOLAM HCL 2 MG/2ML IJ SOLN
INTRAMUSCULAR | Status: AC
  Filled 2023-08-30: qty 2

## 2023-08-30 MED ORDER — BUPIVACAINE LIPOSOME 1.3 % IJ SUSP
INTRAMUSCULAR | Status: AC
  Filled 2023-08-30: qty 10

## 2023-08-30 MED ORDER — LIDOCAINE HCL (PF) 1 % IJ SOLN
INTRAMUSCULAR | Status: AC
  Filled 2023-08-30: qty 2

## 2023-08-30 MED ORDER — HYDROMORPHONE HCL 1 MG/ML IJ SOLN: 0.2000 mg | INTRAMUSCULAR | Status: DC | PRN

## 2023-08-30 MED ORDER — ROSUVASTATIN CALCIUM 5 MG PO TABS
5.0000 mg | ORAL_TABLET | Freq: Every day | ORAL | Status: DC
  Administered 2023-08-30 – 2023-09-01 (×3): 5 mg via ORAL
  Filled 2023-08-30 (×3): qty 1

## 2023-08-30 MED ORDER — ACETAMINOPHEN-CAFFEINE 500-65 MG PO TABS: 1.0000 | ORAL_TABLET | Freq: Every day | ORAL | Status: DC | PRN

## 2023-08-30 MED ORDER — TRANEXAMIC ACID-NACL 1000-0.7 MG/100ML-% IV SOLN
1000.0000 mg | Freq: Once | INTRAVENOUS | Status: AC
  Administered 2023-08-30: 1000 mg via INTRAVENOUS

## 2023-08-30 MED ORDER — LIDOCAINE HCL (PF) 1 % IJ SOLN
INTRAMUSCULAR | Status: DC | PRN
  Administered 2023-08-30: 1 mL via SUBCUTANEOUS

## 2023-08-30 MED ORDER — PRAMIPEXOLE DIHYDROCHLORIDE 0.25 MG PO TABS: 0.1250 mg | ORAL_TABLET | Freq: Every day | ORAL | Status: DC

## 2023-08-30 MED ORDER — FENTANYL CITRATE (PF) 100 MCG/2ML IJ SOLN
INTRAMUSCULAR | Status: AC
  Filled 2023-08-30: qty 2

## 2023-08-30 MED ORDER — STERILE WATER FOR IRRIGATION IR SOLN
Status: DC | PRN
  Administered 2023-08-30: 1000 mL

## 2023-08-30 MED ORDER — ONDANSETRON HCL 4 MG/2ML IJ SOLN
INTRAMUSCULAR | Status: DC | PRN
  Administered 2023-08-30: 4 mg via INTRAVENOUS

## 2023-08-30 MED ORDER — MIDAZOLAM HCL 2 MG/2ML IJ SOLN
INTRAMUSCULAR | Status: DC | PRN
  Administered 2023-08-30: 2 mg via INTRAVENOUS

## 2023-08-30 MED ORDER — OXYCODONE HCL 5 MG/5ML PO SOLN: 5.0000 mg | Freq: Once | ORAL | Status: DC | PRN

## 2023-08-30 MED ORDER — VANCOMYCIN HCL IN DEXTROSE 1-5 GM/200ML-% IV SOLN
1000.0000 mg | Freq: Once | INTRAVENOUS | Status: AC
  Administered 2023-08-30: 1000 mg via INTRAVENOUS

## 2023-08-30 MED ORDER — ONDANSETRON HCL 4 MG/2ML IJ SOLN
INTRAMUSCULAR | Status: AC
  Filled 2023-08-30: qty 2

## 2023-08-30 MED ORDER — PROMETHAZINE HCL 25 MG/ML IJ SOLN: 6.2500 mg | INTRAMUSCULAR | Status: DC | PRN

## 2023-08-30 MED ORDER — VANCOMYCIN HCL 1000 MG IV SOLR
INTRAVENOUS | Status: AC
  Filled 2023-08-30: qty 20

## 2023-08-30 MED ORDER — PROPOFOL 10 MG/ML IV BOLUS
INTRAVENOUS | Status: DC | PRN
  Administered 2023-08-30: 70 mg via INTRAVENOUS

## 2023-08-30 MED ORDER — CEFAZOLIN SODIUM-DEXTROSE 2-4 GM/100ML-% IV SOLN: 2.0000 g | Freq: Four times a day (QID) | INTRAVENOUS | Status: DC

## 2023-08-30 MED ORDER — BUPIVACAINE HCL (PF) 0.5 % IJ SOLN
INTRAMUSCULAR | Status: AC
  Filled 2023-08-30: qty 10

## 2023-08-30 SURGICAL SUPPLY — 87 items
ADH SKN CLS APL DERMABOND .7 (GAUZE/BANDAGES/DRESSINGS)
ANCH SUT .5 CRC TPR CT 40X40 (SUTURE)
ANCH SUT 1.4 SUT TPE BLK/WHT (SUTURE)
APL PRP STRL LF DISP 70% ISPRP (MISCELLANEOUS) ×2
BASEPLATE P2 COATD GLND 6.5X30 (Shoulder) ×1 IMPLANT
BIT DRILL 4 DIA CALIBRATED (BIT) ×1 IMPLANT
BIT DRILL GUIDE PATIENT MATCH (MISCELLANEOUS) ×1 IMPLANT
BIT DRILL JC 5IN 2.0M 127 23FL (BIT) ×1 IMPLANT
BLADE SAGITTAL WIDE XTHICK NO (BLADE) ×2 IMPLANT
BSPLAT GLND 30 STRL LF SHLDR (Shoulder) ×2 IMPLANT
CHLORAPREP W/TINT 26 (MISCELLANEOUS) ×2 IMPLANT
CNTNR URN SCR LID CUP LEK RST (MISCELLANEOUS) IMPLANT
CONT SPEC 4OZ STRL OR WHT (MISCELLANEOUS)
COOLER POLAR GLACIER W/PUMP (MISCELLANEOUS) ×2 IMPLANT
DERMABOND ADVANCED .7 DNX12 (GAUZE/BANDAGES/DRESSINGS) IMPLANT
DRAPE INCISE IOBAN 66X45 STRL (DRAPES) ×4 IMPLANT
DRAPE SHEET LG 3/4 BI-LAMINATE (DRAPES) ×4 IMPLANT
DRAPE TABLE BACK 80X90 (DRAPES) ×2 IMPLANT
DRAPE U-SHAPE 47X51 STRL (DRAPES) ×2 IMPLANT
DRILL GUIDE PATIENT MATCH (MISCELLANEOUS) ×2
DRSG OPSITE POSTOP 3X4 (GAUZE/BANDAGES/DRESSINGS) IMPLANT
DRSG OPSITE POSTOP 4X6 (GAUZE/BANDAGES/DRESSINGS) ×1 IMPLANT
DRSG OPSITE POSTOP 4X8 (GAUZE/BANDAGES/DRESSINGS) IMPLANT
DRSG TEGADERM 2-3/8X2-3/4 SM (GAUZE/BANDAGES/DRESSINGS) ×1 IMPLANT
ELECT REM PT RETURN 9FT ADLT (ELECTROSURGICAL) ×2
ELECTRODE REM PT RTRN 9FT ADLT (ELECTROSURGICAL) ×2 IMPLANT
GAUZE SPONGE 2X2 STRL 8-PLY (GAUZE/BANDAGES/DRESSINGS) IMPLANT
GAUZE XEROFORM 1X8 LF (GAUZE/BANDAGES/DRESSINGS) IMPLANT
GLOVE BIOGEL PI IND STRL 8 (GLOVE) ×4 IMPLANT
GLOVE PI ULTRA LF STRL 7.5 (GLOVE) ×4 IMPLANT
GLOVE SURG ORTHO 8.0 STRL STRW (GLOVE) ×4 IMPLANT
GLOVE SURG SYN 8.0 (GLOVE) ×2 IMPLANT
GLOVE SURG SYN 8.0 PF PI (GLOVE) ×1 IMPLANT
GOWN STRL REUS W/ TWL LRG LVL3 (GOWN DISPOSABLE) ×4 IMPLANT
GOWN STRL REUS W/ TWL XL LVL3 (GOWN DISPOSABLE) ×2 IMPLANT
GOWN STRL REUS W/TWL LRG LVL3 (GOWN DISPOSABLE) ×4
GOWN STRL REUS W/TWL XL LVL3 (GOWN DISPOSABLE) ×2
HEMOVAC 400CC 10FR (MISCELLANEOUS) IMPLANT
HOOD PEEL AWAY T7 (MISCELLANEOUS) ×4 IMPLANT
HUMERA STEM SM SHELL SHOU 10 (Miscellaneous) ×2 IMPLANT
INSERT SMALL SOCKET 32MM (Insert) ×1 IMPLANT
K-WIRE 2.0 (WIRE) ×2
K-WIRE FXSTD 280X2XNS SS (WIRE)
KWIRE FXSTD 280X2XNS SS (WIRE) IMPLANT
MANIFOLD NEPTUNE II (INSTRUMENTS) ×2 IMPLANT
MASK FACE SPIDER DISP (MASK) ×2 IMPLANT
MAT ABSORB FLUID 56X50 GRAY (MISCELLANEOUS) ×2 IMPLANT
NDL REVERSE CUT 1/2 CRC (NEEDLE) IMPLANT
NDL SPNL 20GX3.5 QUINCKE YW (NEEDLE) IMPLANT
NEEDLE REVERSE CUT 1/2 CRC (NEEDLE) IMPLANT
NEEDLE SPNL 20GX3.5 QUINCKE YW (NEEDLE) IMPLANT
NS IRRIG 1000ML POUR BTL (IV SOLUTION) ×2 IMPLANT
P2 COATDE GLNOID BSEPLT 6.5X30 (Shoulder) ×2 IMPLANT
PACK ARTHROSCOPY SHOULDER (MISCELLANEOUS) ×2 IMPLANT
PAD WRAPON POLAR SHDR XLG (MISCELLANEOUS) ×2 IMPLANT
PULSAVAC PLUS IRRIG FAN TIP (DISPOSABLE) ×2
SCREW BONE LOCKING RSP 5.0X14 (Screw) ×2 IMPLANT
SCREW BONE LOCKING RSP 5.0X30 (Screw) ×2 IMPLANT
SCREW BONE RSP LOCK 5X14 (Screw) ×1 IMPLANT
SCREW BONE RSP LOCK 5X26 (Screw) ×1 IMPLANT
SCREW BONE RSP LOCK 5X30 (Screw) ×1 IMPLANT
SCREW BONE RSP LOCKING 5.0X26 (Screw) ×2 IMPLANT
SCREW RETAIN W/HEAD 4MM OFFSET (Shoulder) ×1 IMPLANT
SLING ULTRA II LG (MISCELLANEOUS) IMPLANT
SLING ULTRA II M (MISCELLANEOUS) IMPLANT
SPONGE T-LAP 18X18 ~~LOC~~+RFID (SPONGE) ×3 IMPLANT
STAPLER SKIN PROX 35W (STAPLE) ×1 IMPLANT
STEM HUMERAL SM SHELL SHOU 10 (Miscellaneous) ×1 IMPLANT
STRAP SAFETY 5IN WIDE (MISCELLANEOUS) ×2 IMPLANT
SUT ETHIBOND 5-0 MS/4 CCS GRN (SUTURE)
SUT FIBERWIRE #2 38 BLUE 1/2 (SUTURE) ×4
SUT MNCRL AB 4-0 PS2 18 (SUTURE) ×1 IMPLANT
SUT PROLENE 6 0 P 1 18 (SUTURE) ×1 IMPLANT
SUT TICRON 2-0 30IN 311381 (SUTURE) ×4 IMPLANT
SUT VIC AB 0 CT1 36 (SUTURE) ×2 IMPLANT
SUT VIC AB 2-0 CT2 27 (SUTURE) ×4 IMPLANT
SUT XBRAID 1.4 BLK/WHT (SUTURE) IMPLANT
SUT XBRAID 1.4 BLUE (SUTURE) IMPLANT
SUT XBRAID 1.4 WHITE/BLUE (SUTURE) IMPLANT
SUT XBRAID 2 BLACK/BLUE (SUTURE) IMPLANT
SUTURE ETHBND 5-0 MS/4 CCS GRN (SUTURE) ×1 IMPLANT
SUTURE FIBERWR #2 38 BLUE 1/2 (SUTURE) ×3 IMPLANT
SYR 30ML LL (SYRINGE) IMPLANT
TIP FAN IRRIG PULSAVAC PLUS (DISPOSABLE) ×2 IMPLANT
TRAP FLUID SMOKE EVACUATOR (MISCELLANEOUS) ×2 IMPLANT
WATER STERILE IRR 500ML POUR (IV SOLUTION) ×2 IMPLANT
WRAPON POLAR PAD SHDR XLG (MISCELLANEOUS) ×2

## 2023-08-30 NOTE — Transfer of Care (Signed)
Immediate Anesthesia Transfer of Care Note  Patient: Tonya Cummings  Procedure(s) Performed: Right reverse shoulder arthroplasty, biceps tenodesis (Right: Shoulder)  Patient Location: PACU  Anesthesia Type:General and Regional  Level of Consciousness: sedated and drowsy  Airway & Oxygen Therapy: Patient Spontanous Breathing and Patient connected to face mask oxygen  Post-op Assessment: Report given to RN and Post -op Vital signs reviewed and stable  Post vital signs: Reviewed and stable  Last Vitals:  Vitals Value Taken Time  BP 150/70 08/30/23 1145  Temp    Pulse 79 08/30/23 1148  Resp 18 08/30/23 1148  SpO2 95 % 08/30/23 1148  Vitals shown include unfiled device data.  Last Pain:  Vitals:   08/30/23 0808  TempSrc: Temporal  PainSc: 2          Complications: No notable events documented.

## 2023-08-30 NOTE — Op Note (Signed)
SURGERY DATE: 08/30/2023   PRE-OP DIAGNOSIS:  1. Right shoulder rotator cuff arthropathy   POST-OP DIAGNOSIS:  1. Right shoulder rotator cuff arthropathy   PROCEDURES:  1. Right reverse total shoulder arthroplasty 2. Right biceps tenodesis   SURGEON: Rosealee Albee, MD  ASSISTANTS: Jinny Sanders, RNFA   ANESTHESIA: Gen + interscalene block w/Exparel   ESTIMATED BLOOD LOSS: 250cc   TOTAL IV FLUIDS: per anesthesia record  IMPLANTS: [employer]: RSP Glenoid Head w/Retaining screw 32-4; Monoblock Reverse Shoulder Baseplate with 6.82mm central screw; 3 locking screws into baseplate (superior, posterior, inferior); Small Shell Short Humeral Stem 10 x 48mm; +87mm Small Socket Insert;    INDICATION(S):  Tonya Cummings is a 72 y.o. female with chronic shoulder pain with inability to lift arm overhead. Imaging consistent with massive, irreparable rotator cuff tear. Conservative measures have not provided adequate relief. After discussion of risks, benefits, and alternatives to surgery, the patient elected to proceed with reverse shoulder arthroplasty and biceps tenodesis.   OPERATIVE FINDINGS: massive rotator cuff tear (complete supraspinatus, partial infraspinatus, partial subscapularis)   OPERATIVE REPORT:   I identified Tonya Cummings in the pre-operative holding area. Informed consent was obtained and the surgical site was marked. I reviewed the risks and benefits of the proposed surgical intervention and the patient (and/or patient's guardian) wished to proceed. An interscalene block with Exparel was administered by the Anesthesia team. The patient was transferred to the operative suite and general anesthesia was administered. The patient was placed in the beach chair position with the head of the bed elevated approximately 45 degrees. All down side pressure points were appropriately padded. Pre-op exam under anesthesia confirmed some stiffness and crepitus. Appropriate IV antibiotics  were administered. The extremity was then prepped and draped in standard fashion. A time out was performed confirming the correct extremity, correct patient, and correct procedure.   We used the standard deltopectoral incision from the coracoid to ~12cm distal. We found the cephalic vein and took it laterally. We opened the deltopectoral interval widely and placed retractors under the CA ligament in the subacromial space and under the deltoid tendon at its insertion. We then abducted and internally rotated the arm and released the underlying bursa between these retractors, taking care not to damage the circumflex branch of the axillary nerve.   Next, we brought the arm back in adduction at slight forward flexion with external rotation. We opened the clavipectoral fascia lateral to the conjoint tendon. We gently palpated the axillary nerve and verified its position and continuity on both sides of the humerus with a Tug test. This test was repeated multiple times during the procedure for nerve localization and confirmed to be intact at the end of the case. We then cauterized the anterior humeral circumflex ("Three sisters") vessels. The arm was then internally rotated, we cut the falciform ligament at approximately 1 cm of the upper portion of the pectoralis major insertion. Next we unroofed the bicipital groove. We proceeded with a soft tissue biceps tenodesis given the pathology (significant thickening and tendinopathy proximally) of the tendon.  After opening the biceps tendon sheath all the way to the supraglenoid tubercle, we performed a biceps tenodesis with two #2 TiCron sutures to the upper border of the pectoralis major. The proximal portion of the tendon was excised.   At this point, we could see that the supraspinatus was completely torn with a bald humeral head superiorly. There were a few remnant fibers of the posterior infraspinatus still attached to  the humeral head. The subscapularis also had a  partial tear of the superior fibers. We performed a subscapularis peel using electrocautery to remove the anterior capsule and subscapularis off of the humeral head. We released the inferior capsule from the humerus all the way to the posterior band of the inferior glenohumeral ligament. When this was complete we gently dislocated the shoulder up into the wound. We removed any osteophytes and made our cut with the appropriate inclination in 30 degrees of retroversion  We then turned our attention back to the glenoid. The proximal humerus was retracted posteriorly. The anterior capsule was dissected free from the subscapularis. The anterior capsule was then excised, exposing the anterior glenoid. We then grasped the labrum and removed it circumferentially. During the glenoid exposure, the axillary nerve was protected the entire time.    A patient-specific guide was used to drill the central guidepin. A tap was placed, matching the trajectory of the guidepin. An appropriately sized reamer was used to ream the glenoid. The monoblock baseplate was inserted and excellent fixation was achieved such that the entire scapula rotated with further attempted seating of the baseplate. The 3 peripheral screws were drilled, measured, and placed. A 32-4 glenosphere was then placed and tightened.   We then turned our attention back to the humerus. We sized for a small shell prosthesis. We sequentially used larger diameter canal finders until we met significant resistance and sequential broaching was performed to this size listed above. We trialed both a 0 and +4 poly. The humerus was trialed and noted to have satisfactory stability, motion, and deltoid tension with a +4 poly. The trial implants were removed. The humeral canal was pulse lavaged. 3 drill holes were placed about the lesser tuberosity footprint and FiberWire sutures were passed through these holes for subscapularis repair. Next, the implant was placed with the  appropriate retroversion. Stability was confirmed. We placed a +4 poly. The humerus was reduced and motion, tension, and stability were satisfactory. A Hemovac drain was placed. Subscapularis was repaired with the previously passed #2 FiberWire sutures after passing them through the subscapularis.   We again verified the tension on the axillary nerve, appropriate range of motion, stability of the implant, and security of the subscapularis repair. We closed the deltopectoral interval deep to the cephalic vein with a running, 0-Vicryl suture. The skin was closed with 2-0 Vicryl and staples. Xeroform and Honeycomb dressing was applied. A PolarCare unit and sling were placed. Patient was extubated, transferred to a stretcher bed and to the post antesthesia care unit in stable condition.   Of note, assistance from a RNFA was essential to performing the surgery.  RNFA was present for the entire surgery.  RNFA assisted with patient positioning, retraction, instrumentation, and wound closure. The surgery would have been more difficult and had longer operative time without RNFA assistance.    POSTOPERATIVE PLAN: The patient will be admitted with plan for discharge home on POD#1. Operative arm to remain in sling at all times except RoM exercises and hygiene. Can perform pendulums, elbow/wrist/hand RoM exercises. Passive RoM allowed to 90 FF and 30 ER. ASA 325mg  x 6 weeks for DVT ppx. Plan for PT starting on POD #3-4. Patient to return to clinic in ~2 weeks for post-operative appointment.

## 2023-08-30 NOTE — Anesthesia Procedure Notes (Signed)
Procedure Name: Intubation Date/Time: 08/30/2023 9:13 AM  Performed by: Elisabeth Pigeon, CRNAPre-anesthesia Checklist: Patient identified, Patient being monitored, Timeout performed, Emergency Drugs available and Suction available Patient Re-evaluated:Patient Re-evaluated prior to induction Oxygen Delivery Method: Circle system utilized Preoxygenation: Pre-oxygenation with 100% oxygen Induction Type: IV induction Ventilation: Mask ventilation without difficulty Laryngoscope Size: Mac, 3 and McGraph Grade View: Grade I Tube type: Oral Tube size: 7.0 mm Number of attempts: 1 Airway Equipment and Method: Stylet Placement Confirmation: ETT inserted through vocal cords under direct vision, positive ETCO2 and breath sounds checked- equal and bilateral Secured at: 20 cm Tube secured with: Tape Dental Injury: Teeth and Oropharynx as per pre-operative assessment

## 2023-08-30 NOTE — Evaluation (Signed)
Physical Therapy Evaluation Patient Details Name: Tonya Cummings MRN: [id number] DOB: 01/16/1951 Today's Date: 08/30/2023  History of Present Illness  Pt is a 72 yo F diagnosed with R shoulder rotator cuff arthropathy and is s/p R reverse total shoulder arthroplasty and biceps tenodesis.  PMH includes: CAD, CHF, hiatal hernia, anxiety, depression, GERD, renal disease, arthritis, CKD, HTN, and rheumatoid arthritis.  Clinical Impression  Pt was pleasant and motivated to participate during the session and put forth good effort throughout. Pt is currently supervision with bed mobility, and CGA for transfers and ambulation. Before mobilizing pt, PT provided education on weight bearing status for RUE, and educated on safe navigation in order to avoid doorways/walls when walking. Once sitting EOB, pt able to stand from lowest setting on bed with CGA for safety pushing off with LUE. Once up, pt able to amb with SPC and CGA ~120 feet before pt opting to turn around. Noting pt walking with wide BOS and a decreased speed, pt given light cues for RUE management when walking, able to comply throughout. Upon sitting down and laying back in bed pt experiencing SoB, SpO2 readings of 86% at lowest, but quickly rose back to low 90's after ~1-2 min of resting, nursing notified. Pt will benefit from continued PT services upon discharge to safely address deficits listed in patient problem list for decreased caregiver assistance and eventual return to PLOF.       If plan is discharge home, recommend the following: A lot of help with bathing/dressing/bathroom;Help with stairs or ramp for entrance;Assist for transportation;Assistance with cooking/housework;A little help with walking and/or transfers   Can travel by private vehicle        Equipment Recommendations None recommended by PT  Recommendations for Other Services       Functional Status Assessment Patient has had a recent decline in their functional  status and demonstrates the ability to make significant improvements in function in a reasonable and predictable amount of time.     Precautions / Restrictions Precautions Precautions: Fall;Shoulder Shoulder Interventions: Shoulder abduction pillow;Don joy ultra sling;Off for dressing/bathing/exercises Restrictions Weight Bearing Restrictions: Yes RUE Weight Bearing: Non weight bearing      Mobility  Bed Mobility Overal bed mobility: Needs Assistance Bed Mobility: Supine to Sit     Supine to sit: Supervision, HOB elevated     General bed mobility comments: Supervision, cues to maintain percautions    Transfers Overall transfer level: Needs assistance Equipment used: Straight cane Transfers: Sit to/from Stand Sit to Stand: Contact guard assist           General transfer comment: Pt able to stand with CGA, no cues needed    Ambulation/Gait Ambulation/Gait assistance: Contact guard assist Gait Distance (Feet): 120 Feet Assistive device: Straight cane Gait Pattern/deviations: Step-through pattern, Wide base of support, Decreased step length - right, Decreased step length - left, Decreased stride length Gait velocity: decreased     General Gait Details: Pt able to take steady steps with SPC, reports this is similar to how she typically walks, noting limited ability to visually scan the environment due to postural status, no imbalance seen  Stairs            Wheelchair Mobility     Tilt Bed    Modified Rankin (Stroke Patients Only)       Balance Overall balance assessment: Needs assistance Sitting-balance support: No upper extremity supported, Feet supported Sitting balance-Leahy Scale: Fair     Standing balance support: Single extremity  supported Standing balance-Leahy Scale: Fair Standing balance comment: Static and dynamic movmenet with SPC                             Pertinent Vitals/Pain Pain Assessment Pain Assessment: No/denies  pain Pain Score: 2  Pain Location: R hip and neck Pain Intervention(s): Monitored during session, Limited activity within patient's tolerance    Home Living Family/patient expects to be discharged to:: Private residence Living Arrangements: Spouse/significant other;Other relatives;Children Available Help at Discharge: Family;Available 24 hours/day Type of Home: Mobile home Home Access: Stairs to enter Entrance Stairs-Rails: Can reach both;Right;Left Entrance Stairs-Number of Steps: 3   Home Layout: One level Home Equipment: Agricultural consultant (2 wheels);Cane - single point;BSC/3in1      Prior Function Prior Level of Function : Independent/Modified Independent             Mobility Comments: Uses SPC at baseline, limited community ambulator ADLs Comments: Ind     Extremity/Trunk Assessment   Upper Extremity Assessment Upper Extremity Assessment: Defer to OT evaluation    Lower Extremity Assessment Lower Extremity Assessment: Overall WFL for tasks assessed       Communication   Communication Communication: No apparent difficulties Cueing Techniques: Verbal cues  Cognition Arousal: Alert Behavior During Therapy: WFL for tasks assessed/performed Overall Cognitive Status: Within Functional Limits for tasks assessed                                          General Comments      Exercises Other Exercises Other Exercises: Pt provided educaiton to avoid hitting her RUE on doorways or walls, to be aware on where RUE is when moving around   Assessment/Plan    PT Assessment Patient needs continued PT services  PT Problem List Decreased strength;Decreased range of motion;Decreased activity tolerance;Decreased balance;Decreased mobility;Decreased coordination       PT Treatment Interventions DME instruction;Balance training;Gait training;Stair training;Functional mobility training;Therapeutic activities;Therapeutic exercise    PT Goals (Current goals  can be found in the Care Plan section)  Acute Rehab PT Goals Patient Stated Goal: to be able walk and do everything like before PT Goal Formulation: With patient Time For Goal Achievement: 09/13/23 Potential to Achieve Goals: Good    Frequency BID     Co-evaluation               AM-PAC PT "6 Clicks" Mobility  Outcome Measure Help needed turning from your back to your side while in a flat bed without using bedrails?: A Little Help needed moving from lying on your back to sitting on the side of a flat bed without using bedrails?: A Little Help needed moving to and from a bed to a chair (including a wheelchair)?: A Little Help needed standing up from a chair using your arms (e.g., wheelchair or bedside chair)?: A Little Help needed to walk in hospital room?: A Little Help needed climbing 3-5 steps with a railing? : A Little 6 Click Score: 18    End of Session Equipment Utilized During Treatment: Gait belt Activity Tolerance: Patient tolerated treatment well Patient left: in bed;with bed alarm set;with call bell/phone within reach Nurse Communication: Mobility status (SpO2 values; see PT comments) PT Visit Diagnosis: Other abnormalities of gait and mobility (R26.89);Difficulty in walking, not elsewhere classified (R26.2)    Time: 4098-1191 PT Time Calculation (min) (  ACUTE ONLY): 24 min   Charges:   PT Evaluation $PT Eval Moderate Complexity: 1 Mod PT Treatments $Gait Training: 8-22 mins PT General Charges $$ ACUTE PT VISIT: 1 Visit         Cecile Sheerer, SPT 08/30/23, 5:27 PM

## 2023-08-30 NOTE — H&P (Signed)
Paper H&P to be scanned into permanent record. H&P reviewed. No significant changes noted.

## 2023-08-30 NOTE — Anesthesia Postprocedure Evaluation (Signed)
Anesthesia Post Note  Patient: Tonya Cummings  Procedure(s) Performed: Right reverse shoulder arthroplasty, biceps tenodesis (Right: Shoulder)  Patient location during evaluation: PACU Anesthesia Type: General Level of consciousness: awake and alert Pain management: pain level controlled Vital Signs Assessment: post-procedure vital signs reviewed and stable Respiratory status: spontaneous breathing, nonlabored ventilation, respiratory function stable and patient connected to nasal cannula oxygen Cardiovascular status: blood pressure returned to baseline and stable Postop Assessment: no apparent nausea or vomiting Anesthetic complications: no   No notable events documented.   Last Vitals:  Vitals:   08/30/23 1215 08/30/23 1245  BP: 138/74 (!) 140/65  Pulse: 84 81  Resp: 19 19  Temp:  (!) 36.4 C  SpO2: 100% 100%    Last Pain:  Vitals:   08/30/23 1245  TempSrc:   PainSc: 0-No pain                 Foye Deer

## 2023-08-30 NOTE — Plan of Care (Signed)
Problem: Activity: Goal: Ability to tolerate increased activity will improve Outcome: Progressing   Problem: Pain Management: Goal: Pain level will decrease with appropriate interventions Outcome: Progressing

## 2023-08-30 NOTE — Discharge Summary (Deleted)
Physician Discharge Summary  Patient ID: Tonya Cummings MRN: [id number] DOB/AGE: 71-Feb-1952 71 y.o.  Admit date: 08/30/2023 Discharge date: 08/31/2023  Admission Diagnoses:  Rotator cuff arthropathy [M12.819]   Discharge Diagnoses: Patient Active Problem List   Diagnosis Date Noted   Rotator cuff arthropathy 08/30/2023   Anemia in stage 4 chronic kidney disease (HCC) 01/10/2023   Long-term use of immunosuppressant medication 03/09/2022   Dyspnea 09/29/2021   Acute CHF (congestive heart failure) (HCC) 09/29/2021   Acute hypoxemic respiratory failure (HCC) 09/29/2021   Total knee replacement status 10/13/2020   CKD (chronic kidney disease) stage 3, GFR 30-59 ml/min (HCC) 10/12/2020   Hyperlipidemia 10/12/2020   Hypertension 10/12/2020   Migraine headache 10/12/2020   Restless legs syndrome (RLS) 10/12/2020   Severe obesity (BMI 35.0-39.9) with comorbidity (HCC) 06/30/2020   Primary osteoarthritis of right knee 03/27/2020   Iron deficiency anemia 09/12/2019   Low vitamin B12 level 09/12/2019   Normocytic anemia 09/07/2019   Right medial knee pain 08/09/2018   Depression with anxiety 03/22/2018   Sleep disorder 03/22/2018   Influenza A 02/17/2018   Community acquired pneumonia 02/17/2018   Chronic radicular pain of lower back 02/06/2018   Numbness and tingling of right leg 02/06/2018   Hip dysplasia 02/23/2016   Osteoporosis, post-menopausal 02/23/2016   Raynaud's disease without gangrene 02/23/2016   Seropositive rheumatoid arthritis (HCC) 02/23/2016   Lumbar facet joint pain 09/18/2014    Past Medical History:  Diagnosis Date   Acute hypoxemic respiratory failure (HCC)    Anemia in stage 4 chronic kidney disease (HCC)    Anginal pain (HCC)    Anxiety    Aortic atherosclerosis (HCC)    CHF (congestive heart failure) (HCC)    a.) TTE 09/30/2021: EF 55-60%, no RWMAs, mild MR, G1DD   Chronic pain    Chronic radicular pain of lower back    CKD (chronic kidney  disease), stage IV (HCC)    Complication of anesthesia    a.) delayed emergence following ureteroscopy 07/2023   Coronary artery calcification seen on CT scan    Costochondritis    DDD (degenerative disc disease), lumbar    Depression    Dyspnea    GERD (gastroesophageal reflux disease)    Hiatal hernia    Hip dysplasia    Hyperlipidemia    Hypertension    Insomnia    Iron deficiency    Iron deficiency anemia    Low back pain    Low vitamin B12 level    Lumbar facet joint pain    Migraines    Nose colonized with MRSA 10/03/2020   a.) preop PCR (+) 10/03/2020 prior to RIGHT TKA; b.) preop PCR (+) 08/25/2023 prior to RIGHT REVERSE SHOULDER ARTHROPLASTY; BICEPS TENODESIS   Numbness and tingling of right leg    OA (osteoarthritis)    Obesity    OSA (obstructive sleep apnea)    a.) not currently utilizing nocturnal PAP therapy; positional. PCCM feels as if symptom can be mitigated with diet/exercise/weight loss unless develops significant symptoms.   Osteoporosis    a.) recieves denosumab injections   Pelvic fracture (HCC)    Pneumonia    Pulmonary fibrosis (HCC)    Raynaud's disease without gangrene    Restless leg syndrome    a.) on pramipexole + oral Fe supplementation   Rheumatoid arthritis (HCC)    a.) Tx'd with hydroxychloroquine   Right renal mass 05/28/2023   a.) CT renal 05/28/2023:  5.7 cm mass in the medial  aspect of right kidney   Seasonal allergies    Thoracic compression fracture (HCC)      Transfusion: None.   Consultants (if any):   Discharged Condition: Improved  Hospital Course: Tonya Cummings is an 72 y.o. female who was admitted 08/30/2023 with a diagnosis of right shoulder rotator cuff arthropathy and went to the operating room on 08/30/2023 and underwent the above named procedures.    Surgeries: Procedure(s): Right reverse shoulder arthroplasty, biceps tenodesis on 08/30/2023 Patient tolerated the surgery well. Taken to PACU where she was  stabilized and then transferred to the recovery area.  Started on Aspirin 325mg  every 24 hrs. Heels elevated on bed with rolled towels. No evidence of DVT. Negative Homan. Physical therapy started on day #0 for gait training and transfer. OT started day #0 for ADL and assisted devices.  Patient's IV and hemovac were removed on POD1.  Implants: [facility] Surgical: RSP Glenoid Head w/Retaining screw 32-4; Monoblock Reverse Shoulder Baseplate with 6.65mm central screw; 3 locking screws into baseplate (superior, posterior, inferior); Small Shell Short Humeral Stem 10 x 48mm; +68mm Small Socket Insert;   She was given perioperative antibiotics:  Anti-infectives (From admission, onward)    Start     Dose/Rate Route Frequency Ordered Stop   08/30/23 2200  hydroxychloroquine (PLAQUENIL) tablet 200 mg        200 mg Oral 2 times daily 08/30/23 1257     08/30/23 2200  ceFAZolin (ANCEF) IVPB 2g/100 mL premix        2 g 200 mL/hr over 30 Minutes Intravenous Every 12 hours 08/30/23 1336 08/31/23 2159   08/30/23 1600  ceFAZolin (ANCEF) IVPB 2g/100 mL premix  Status:  Discontinued        2 g 200 mL/hr over 30 Minutes Intravenous Every 6 hours 08/30/23 1257 08/30/23 1336   08/30/23 0945  vancomycin (VANCOCIN) powder  Status:  Discontinued          As needed 08/30/23 0945 08/30/23 1139   08/30/23 0745  vancomycin (VANCOCIN) IVPB 1000 mg/200 mL premix        1,000 mg 200 mL/hr over 60 Minutes Intravenous  Once 08/30/23 0743 08/30/23 0947   08/30/23 0745  ceFAZolin (ANCEF) IVPB 2g/100 mL premix        2 g 200 mL/hr over 30 Minutes Intravenous On call to O.R. 08/30/23 1027 08/30/23 0949     .  She was given sequential compression devices, early ambulation, and Aspirin for DVT prophylaxis.  She benefited maximally from the hospital stay and there were no complications.    Recent vital signs:  Vitals:   08/31/23 0526 08/31/23 0730  BP: (!) 142/77 132/69  Pulse: 93 86  Resp: 18 16  Temp: 97.9 F (36.6 C)  98.6 F (37 C)  SpO2: 93% 94%    Recent laboratory studies:  Lab Results  Component Value Date   HGB 8.0 (L) 08/31/2023   HGB 8.9 (L) 08/25/2023   HGB 8.7 (L) 06/17/2023   Lab Results  Component Value Date   WBC 12.2 (H) 08/31/2023   PLT 216 08/31/2023   Lab Results  Component Value Date   INR 1.0 10/03/2020   Lab Results  Component Value Date   NA 140 08/31/2023   K 4.4 08/31/2023   CL 106 08/31/2023   CO2 25 08/31/2023   BUN 36 (H) 08/31/2023   CREATININE 1.86 (H) 08/31/2023   GLUCOSE 124 (H) 08/31/2023    Discharge Medications:   Allergies as of  08/31/2023       Reactions   Gabapentin Other (See Comments)   Weight gain   Ibuprofen Other (See Comments)   Headache        Medication List     STOP taking these medications    Excedrin Tension Headache 500-65 MG Tabs per tablet Generic drug: acetaminophen-caffeine       TAKE these medications    acetaminophen 500 MG tablet Commonly known as: TYLENOL Take 2 tablets (1,000 mg total) by mouth every 6 (six) hours as needed.   aspirin EC 325 MG tablet Take 1 tablet (325 mg total) by mouth daily.   calcitRIOL 0.25 MCG capsule Commonly known as: ROCALTROL Take 0.25 mcg by mouth every morning.   Calcium Carb-Cholecalciferol 600-400 MG-UNIT Caps Take 1 capsule by mouth 3 (three) times daily.   Cholecalciferol 50 MCG (2000 UT) Caps Take 2,000 Units by mouth every morning.   cyanocobalamin 500 MCG tablet Commonly known as: VITAMIN B12 Take 500 mcg by mouth every morning.   cyclobenzaprine 10 MG tablet Commonly known as: FLEXERIL Take 10 mg by mouth at bedtime.   enalapril-hydrochlorothiazide 10-25 MG tablet Commonly known as: VASERETIC Take 1 tablet by mouth at bedtime.   ferrous sulfate 325 (65 FE) MG tablet Take 325 mg by mouth daily with breakfast.   furosemide 40 MG tablet Commonly known as: LASIX Take 60 mg by mouth every morning.   hydroxychloroquine 200 MG tablet Commonly known as:  PLAQUENIL Take 1 tablet by mouth 2 (two) times daily.   mirtazapine 30 MG tablet Commonly known as: REMERON Take 1 tablet by mouth at bedtime.   MULTIVITAMIN ADULT PO Take 1 tablet by mouth every morning.   mupirocin ointment 2 % Commonly known as: BACTROBAN Apply small about inside of both nostrils TWICE a day for the next 5 days.   omeprazole 40 MG capsule Commonly known as: PRILOSEC Take 40 mg by mouth every morning.   ondansetron 4 MG tablet Commonly known as: ZOFRAN Take 1 tablet (4 mg total) by mouth every 6 (six) hours as needed for nausea.   oxyCODONE 5 MG immediate release tablet Commonly known as: Oxy IR/ROXICODONE Take 1-2 tablets (5-10 mg total) by mouth every 4 (four) hours as needed for moderate pain or severe pain.   Pirfenidone 267 MG Tabs Take 3 tablets by mouth 3 (three) times daily.   pramipexole 0.125 MG tablet Commonly known as: MIRAPEX Take 0.125 mg by mouth daily. Take 0.125 mg in the morning and 2 at bedtime   Prolia 60 MG/ML Sosy injection Generic drug: denosumab Inject 60 mg into the skin every 6 (six) months.   rosuvastatin 5 MG tablet Commonly known as: CRESTOR Take 5 mg by mouth at bedtime. Take every other night   spironolactone 25 MG tablet Commonly known as: ALDACTONE Take 25 mg by mouth every morning.   Venlafaxine HCl 75 MG Tb24 Take 1 tablet by mouth every morning. Take daily with 150 mg for total 225 mg daily   venlafaxine XR 150 MG 24 hr capsule Commonly known as: EFFEXOR-XR Take 150 mg by mouth daily with breakfast. Take with 75 mg for total 225 mg daily        Diagnostic Studies: DG Shoulder Right Port  Result Date: 08/30/2023 CLINICAL DATA:  [id number] Status post shoulder replacement [id number] EXAM: RIGHT SHOULDER - 1 VIEW COMPARISON:  08/11/2023 FINDINGS: Interval postsurgical changes from reverse right shoulder arthroplasty. Arthroplasty components are in their expected alignment. No periprosthetic fracture identified.  Expected postoperative changes within the overlying soft tissues. IMPRESSION: Interval postsurgical changes from reverse right shoulder arthroplasty. No evidence of immediate postoperative complication. Electronically Signed   By: Duanne Guess D.O.   On: 08/30/2023 16:03   [place] OR NERVE BLOCK-IMAGE ONLY [facility])  Result Date: 08/30/2023 There is no interpretation for this exam.  This order is for images obtained during a surgical procedure.  Please See "Surgeries" Tab for more information regarding the procedure.   CT SHOULDER RIGHT WO CONTRAST  Result Date: 08/27/2023 CLINICAL DATA:  Right shoulder pain. EXAM: CT OF THE UPPER RIGHT EXTREMITY WITHOUT CONTRAST TECHNIQUE: Multidetector CT imaging of the upper right extremity was performed according to the standard protocol. RADIATION DOSE REDUCTION: This exam was performed according to the departmental dose-optimization program which includes automated exposure control, adjustment of the mA and/or kV according to patient size and/or use of iterative reconstruction technique. COMPARISON:  MR shoulder 07/07/2023 FINDINGS: Bones/Joint/Cartilage No fracture or dislocation. Normal alignment. No joint effusion. Mild osteoarthritis of the glenohumeral joint. Loss of the normal acromiohumeral distance. Mild arthropathy of the acromioclavicular joint. Ligaments Ligaments are suboptimally evaluated by CT. Muscles and Tendons Moderate atrophy of the supraspinatus muscle. Mild atrophy of the infraspinatus muscle. Remainder the muscles demonstrate no focal abnormality. Rotator cuff is limited in evaluation by CT in was better evaluated on the MRI of the shoulder dated 07/07/2023 demonstrating a large full-thickness tear of the supraspinatus and infraspinatus tendons. Soft tissue No fluid collection or hematoma. No soft tissue mass. Partially visualized right upper and lower lung pulmonary nodules with the largest in the right lower lobe measuring 5 mm. These are better  evaluated on the CT of the chest dated 07/15/2023. IMPRESSION: 1. No acute osseous injury of the right shoulder. 2. Mild osteoarthritis of the glenohumeral joint. 3. Mild arthropathy of the acromioclavicular joint. 4. Moderate atrophy of the supraspinatus muscle. Mild atrophy of the infraspinatus muscle. Rotator cuff is limited in evaluation by CT in this is better evaluated on the MRI of the shoulder dated 07/07/2023 demonstrating a large full-thickness tear of the supraspinatus and infraspinatus tendons. 5. Partially visualized right upper and lower lung pulmonary nodules with the largest in the right lower lobe measuring 5 mm. These are better evaluated on the CT of the chest dated 07/15/2023. Electronically Signed   By: Elige Ko M.D.   On: 08/27/2023 07:43    Disposition: Plan for discharge home today pending progress with PT.     Follow-up Information     Dedra Skeens, PA-C Follow up in 14 day(s).   Specialty: Orthopedic Surgery Why: Suture Removal. Contact information: [address] [place] [address] [phone]                Signed: Meriel Pica PA-C 08/31/2023, 7:35 AM

## 2023-08-30 NOTE — Discharge Instructions (Signed)
Tonya Albee, MD  [facility]  Phone: [phone]  Fax: [phone]   Discharge Instructions after Reverse Shoulder Replacement    1. Activity/Sling: You are to be non-weight bearing on operative extremity. A sling/shoulder immobilizer has been provided for you. Only remove the sling to perform elbow, wrist, and hand RoM exercises and hygiene/dressing. Active reaching and lifting are not permitted. You will be given further instructions on sling use at your first physical therapy visit and postoperative visit with Dr. Allena Katz.   2. Dressings: Dressing may be removed at 1st physical therapy visit (~3-4 days after surgery). Afterwards, you may either leave open to air (if no drainage) or cover with dry, sterile dressing. If you have steri-strips on your wound, please do not remove them. They will fall off on their own. You may shower 5 days after surgery. Please pat incision dry. Do not rub or place any shear forces across incision. If there is drainage or any opening of incision after 5 days, please notify our offices immediately.    3. Driving:  Plan on not driving for six weeks. Please note that you are advised NOT to drive while taking narcotic pain medications as you may be impaired and unsafe to drive.   4. Medications:  - You have been provided a prescription for narcotic pain medicine (usually oxycodone). After surgery, take 1-2 narcotic tablets every 4 hours if needed for severe pain. Please start this as soon as you begin to start having pain (if you received a nerve block, start taking as soon as this wears off).  - A prescription for anti-nausea medication will be provided in case the narcotic medicine causes nausea - take 1 tablet every 6 hours only if nauseated.  - Take enteric coated aspirin 325 mg once daily for 6 weeks to prevent blood clots. Do not take aspirin if you have an aspirin sensitivity/allergy or asthma or are on an anticoagulant (blood thinner) already. If so, then  your home anticoagulant will be resume and managed - do not take aspirin. -Take tylenol 1000mg  (2 Extra strength or 3 regular strength tablets) every 8 hours for pain. This will reduce the amount of narcotic medication needed. May stop tylenol when you are having minimal pain. - Take a stool softener (Colace, Dulcolax or Senakot) if you are using narcotic pain medications to help with constipation that is associated with narcotic use. - DO NOT take ANY nonsteroidal anti-inflammatory pain medications: Advil, Motrin, Ibuprofen, Aleve, Naproxen, or Naprosyn.   If you are taking prescription medication for anxiety, depression, insomnia, muscle spasm, chronic pain, or for attention deficit disorder you are advised that you are at a higher risk of adverse effects with use of narcotics post-op, including narcotic addiction/dependence, depressed breathing, death. If you use non-prescribed substances: alcohol, marijuana, cocaine, heroin, methamphetamines, etc., you are at a higher risk of adverse effects with use of narcotics post-op, including narcotic addiction/dependence, depressed breathing, death. You are advised that taking > 50 morphine milligram equivalents (MME) of narcotic pain medication per day results in twice the risk of overdose or death. For your prescription provided: oxycodone 5 mg - taking more than 6 tablets per day after the first few days of surgery.   5. Physical Therapy: 1-2 times per week for ~12 weeks. Therapy typically starts on post operative Day 3 or 4. You have been provided an order for physical therapy. The therapist will provide home exercises. Please contact our offices if this appointment has not been scheduled.  6. Work: May do light duty/desk job in approximately 2 weeks when off of narcotics, pain is well-controlled, and swelling has decreased if able to function with one arm in sling. Full work may take 6 weeks if light motions and function of both arms is required.  Lifting jobs may require 12 weeks.   7. Post-Op Appointments: Your first post-op appointment will be with Dr. Allena Katz in approximately 2 weeks time.    If you find that they have not been scheduled please call the Orthopaedic Appointment front desk at [phone].                               Tonya Albee, MD [facility] Phone: [phone] Fax: [phone]   REVERSE SHOULDER ARTHROPLASTY REHAB GUIDELINES   These guidelines should be tailored to individual patients based on their rehab goals, age, precautions, quality of repair, etc.  Progression should be based on patient progress and approval by the referring physician.  PHASE 1 - Day 1 through Week 2  GENERAL GUIDELINES AND PRECAUTIONS Sling wear 24/7 except during grooming and home exercises (3 to 5 times daily) Avoid shoulder extension such that the arm is posterior the frontal plane.  When patients recline, a pillow should be placed behind the upper arm and sling should be on.  They should be advised to always be able to see the elbow Avoid combined IR/ADD/EXT, such as hand behind back to prevent dislocation Avoid combined IR and ADD such as reaching across the chest to prevent dislocation No AROM No submersion in pool/water for 4 weeks No weight bearing through operative arm (as in transfers, walker use, etc.)  GOALS Maintain integrity of joint replacement; protect soft tissue healing Increase PROM for elevation to 120 and ER to 30 (will remain the goal for first 6 weeks) Optimize distal UE circulation and muscle activity (elbow, wrist and hand) Instruct in use of sling for proper fit, polar care device for ice application after HEP, signs/symptoms of infection  EXERCISES Active elbow, wrist and hand Passive forward elevation in scapular plane to 90-120 max motion; ER in scapular plane to 30 Active scapular retraction with arms resting in neutral position  CRITERIA TO PROGRESS TO  PHASE 2 Low pain (less than 3/10) with shoulder PROM Healing of incision without signs of infection Clearance by MD to advance after 2 week MD check up  PHASE 2 - 2 weeks - 6 weeks  GENERAL GUIDELINES AND PRECAUTIONS Sling may be removed while at home; worn in community without abduction pillow May use arm for light activities of daily living (such as feeding, brushing teeth, dressing.) with elbow near  the side of the body  and arm in front of the body- no active lifting of the arm May submerge in water (tub, pool, [place], etc.) after 4 weeks Continue to avoid WBing through the operative arm Continue to avoid combined IR/EXT/ADD (hand behind the back) and IR/ADD  (reaching across chest) for dislocation precautions  GOALS  Achieve passive elevation to 120 and ER to 30  Low (less than 3/10) to no pain  Ability to fire all heads of the deltoid  EXERCISES May discontinue grip, and active elbow and wrist exercises since using the arm in ADL's  with sling removed around the home Continue passive elevation to 120 and ER to 30, both in scapular plane with arm supported on table top Add submaximal isometrics, pain free effort, for all  functional heads of deltoid (anterior, posterior, middle)  Ensure that with posterior deltoid isometric the shoulder does not move into extension and the arm remains anterior the frontal plane At 4 weeks:  begin to place arm in balanced position of 90 deg elevation in supine; when patient able to hold this position with ease, may begin reverse pendulums clockwise and counterclockwise  CRITERIA TO PROGRESS TO PHASE 3 Passive forward elevation in scapular plane to 120; passive ER in scapular plane to 30 Ability to fire isometrically all heads of the deltoid muscle without pain Ability to place and hold the arm in balanced position (90 deg elevation in supine)  PHASE 3 - 6 weeks to 3 months  GENERAL GUIDELINES AND PRECAUTIONS Discontinue use of sling Avoid  forcing end range motion in any direction to prevent dislocation  May advance use of the arm actively in ADL's without being restricted to arm by the side of the body, however, avoid heavy lifting and sports (forever!) May initiate functional IR behind the back gently NO UPPER BODY ERGOMETER   GOALS Optimize PROM for elevation and ER in scapular plane with realistic expectation that max  mobility for elevation is usually around 145-160 passively; ER 40 to 50 passively; functional IR to L1 Recover AROM to approach as close to PROM available as possible; may expect 135-150 deg active elevation; 30 deg active ER; active functional IR to L1 Establish dynamic stability of the shoulder with deltoid and periscapular muscle gradual strengthening  EXERCISES Forward elevation in scapular plane active progression: supine to incline, to vertical; short to long lever arm Balanced position long lever arm AROM Active ER/IR with arm at side Scapular retraction with light band resistance Functional IR with hand slide up back - very gentle and gradual NO UPPER BODY ERGOMETER     CRITERIA TO PROGRESS TO PHASE 4  AROM equals/approaches PROM with good mechanics for elevation   No pain  Higher level demand on shoulder than ADL functions   PHASE 4 12 months and beyond  GENERAL GUIDELINES AND PRECAUTIONS No heavy lifting and no overhead sports No heavy pushing activity Gradually increase strength of deltoid and scapular stabilizers; also the rotator cuff if present with weights not to exceed 5 lbs NO UPPER BODY ERGOMETER   GOALS  Optimize functional use of the operative UE to meet the desired demands  Gradual increase in deltoid, scapular muscle, and rotator cuff strength  Pain free functional activities   EXERCISES Add light hand weights for deltoid up to and not to exceed 3 lbs for anterior and posterior with long arm lift against gravity; elbow bent to 90 deg for abduction in scapular  plane Theraband progression for extension to hip with scapular depression/retraction Theraband progression for serratus anterior punches in supine; avoid wall, incline or prone pressups for serratus anterior End range stretching gently without forceful overpressure in all planes (elevation in scapular plane, ER in scapular plane, functional IR) with stretching done for life as part of a daily routine NO UPPER BODY ERGOMETER     CRITERIA FOR DISCHARGE FROM SKILLED PHYSICAL THERAPY  Pain free AROM for shoulder elevation (expect around 135-150)  Functional strength for all ADL's, work tasks, and hobbies approved by Careers adviser  Independence with home maintenance program   NOTES: 1. With proper exercise, motion, strength, and function continue to improve even after one year. 2. The complication rate after surgery is 5 - 8%. Complications include infection, fracture, heterotopic bone formation, nerve injury, instability, rotator cuff  tear, and tuberosity nonunion. Please look for clinical signs, unusual symptoms, or lack of progress with therapy and report those to Dr. Allena Katz. Prefer more communication than less.  3. The therapy plan above only serves as a guide. Please be aware of specific individualized patient instructions as written on the prescription or through discussions with the surgeon. 4. Please call Dr. Allena Katz if you have any specific questions or concerns [phone]

## 2023-08-30 NOTE — Anesthesia Preprocedure Evaluation (Addendum)
Anesthesia Evaluation  Patient identified by MRN, date of birth, ID band Patient awake    Reviewed: Allergy & Precautions, NPO status , Patient's Chart, lab work & pertinent test results  History of Anesthesia Complications (+) PROLONGED EMERGENCE and history of anesthetic complications (following ureteroscopy 07/2023)  Airway Mallampati: IV   Neck ROM: Full    Dental  (+) Missing, Loose,    Pulmonary shortness of breath (pulmonary fibrosis), sleep apnea  RA associcated ILD and pulmonary fibrosis  PULMONARY FUNCTION TESTING performed on 04/07/2023 1. 04/07/2023- FEV1 - 1.52L- 71%  2. 08/03/2022- FEV1- 1.16L - 51% 3. 03/18/2022- FEV1 - 1.19L - 55% 4. 10/26/2021- FEV1- 0.88L- 39%    Pulmonary exam normal        Cardiovascular Exercise Tolerance: Poor hypertension, + CAD (on CT scan), +CHF (Heart failure with preserved ejection fraction) and + DOE   Rhythm:Regular Rate:Normal + Peripheral Edema ECG 05/28/23: NSR; LVH; ST and T abnormality  Echo 09/30/21:  1. Left ventricular ejection fraction, by estimation, is 55 to 60%. The  left ventricle has normal function. The left ventricle has no regional  wall motion abnormalities. Left ventricular diastolic parameters are  consistent with Grade I diastolic  dysfunction (impaired relaxation).  2. Right ventricular systolic function is normal. The right ventricular size is normal.  3. The mitral valve is normal in structure. Mild mitral valve regurgitation.  4. The aortic valve is normal in structure. Aortic valve regurgitation is not visualized.     Neuro/Psych  Headaches PSYCHIATRIC DISORDERS Anxiety Depression    chronic thoracic compression fractures, RLS, lumbar DDD with associated chronic radicular lower back pai  Pt states she has a cervical disc with disease and in need of a neurosurgical procedure. Her head is positioned with rightward tilt. There is mechanical and pain  resistance to range of motion of neck to the left and backward. Movement of neck does not cause worsening extremity pain or neurological symptoms. She endorses more left than right numbness and pain in arms.   Neuromuscular disease    GI/Hepatic hiatal hernia (large),GERD  ,,GERD with hiatal hernia which may be contributing with laryngopharyngeal reflux   Endo/Other  Obesity   Renal/GU Renal InsufficiencyRenal disease (stage IV CKD)Stable large right renal mass     Musculoskeletal  (+) Arthritis , Rheumatoid disorders,  inflammatory arthropathy with rheumatoid and Raynaud's phenomenon   Abdominal Normal abdominal exam  (+)   Peds  Hematology  (+) Blood dyscrasia, anemia   Anesthesia Other Findings  Per pulmonary medicine Karna Christmas, MD), "this patient is optimized for surgery and may proceed with the planned procedural course with a LOW risk of significant perioperative cardiopulmonary complications".    Per cardiology (Monge, NP-C), "according to the Revised Cardiac Risk Index (RCRI), her Perioperative Risk of Major Cardiac Event is (%): 6.6. Her Functional Capacity in METs is: 5.04 according to the Duke Activity Status Index (DASI).Therefore, based on ACC/AHA guidelines, patient would be at acceptable risk for the planned procedure without further cardiovascular testing".       Reviewed and agree with Edd Fabian pre-anesthesia clinical review note.    Cardiology note 05/10/23:  1. Chronic diastolic congestive heart failure without symptoms today.  Last echocardiogram was completed in 2020 with an LVEF of 55-60%, no regional wall motion abnormalities, G1 DD, mild mitral valve regurgitation.  Patient appears euvolemic on exam today.  She is continued on furosemide 60 mg daily and spironolactone 25 mg daily.   2. Essential hypertension with blood pressure today  120/66.  She is continued on furosemide and spironolactone.  Encouraged to continue to monitor pressure 1 to 2 hours post  medication administration.   3. CKD stage IV with a last serum creatinine 2.92 in 2/24, prescription sent to be followed by nephrology.   4. Chronic back pain with severe neck pain with associated symptoms of numbness and tingling to the bilateral upper extremities.  She has upcoming surgery with neurosurgery.   5. Preoperative cardiovascular risk assessment completed.    Ms. Wissner perioperative risk of a major cardiac event is 6.6% according to the Revised Cardiac Risk Index (RCRI).  Therefore, she is at high risk for perioperative complications.   Her functional capacity is poor at 4.64 METs according to the Duke Activity Status Index (DASI). Recommendations: According to ACC/AHA guidelines, no further cardiovascular testing needed.  The patient may proceed to surgery at acceptable risk.     6.   Disposition patient return to clinic to see MD/APP in 1 year or sooner if needed with EKG on return.      Pulmonology note 04/07/23:  Dyspnea on exertion and shortness of breath at rest -Etiology is multifactorial-including chronic anemia, physical deconditioning, obstructive sleep apnea, heart and kidney failure, morbid obesity, inflammatory arthropathy with rheumatoid and Raynaud's phenomenon, metabolic syndrome, and likely GERD with hiatal hernia which may be contributing with laryngopharyngeal reflux. -Heart failure with preserved ejection fraction- most recent transthoracic echo October 2022 with EF 55 to 60% with normal left ventricular function and no regional wall abnormalities. Additionally grade 1 diastolic dysfunction was noted. -Chronic kidney disease stage 4-followed by [facility]- Dr. Lourdes Sledge -in the context of rheumatoid arthritis and NSAID use with protein urea and MGUS. Continuing diuresis with Lasix chronically -Chronic anemia-followed by medical oncology-Dr. Rao-status post colonoscopy with a 5 mm polyp in the proximal ascending colon and diverticulosis of the  sigmoid. EGD was normal B12 was normal.  Restless leg syndrome IRLS 16  - continue pramipexole - continue iron supplementation  Obstructive Sleep Apnea Screening  Snorring: -Y Excessive datytime sleepiness: -Y Gasping or Choking during Sleep: 0Y Morning Headaches -YES  Trouble concentrating : -YES  Depression: PHQ2 - POSITIVE FOR DEPRSSION  Restlessness during Sleep: IRLS 17  Epworth Score: 13 Neck circumference >16in F or >17in M: 17 Structural abnormalities: Retrogranthia   Obesity: MBI 38 Caffeine intake : n Alcohol intake : n Medications : -remeron, mirapex, prozac  Symptoms of insomnia, hypersomnia, parasomnias, circadian rhythm disorder: n- patient is good candidate for CPAP and is agreeable to wearing device. She would benefit from treatment of OSA.   CPAP candidate: claustrophobia, interface issues: -none   Face to face discussion regarding Sleep Study done with patient and patient is agreeable: -patient is agreeable   Rheumatoid arthritis - patient with this condition may develop restrictive lung disease with Rheumatoid nodules and pulmonary fibrosis in pattern consistent with UIP.  -CT chest high resolution for ILD and review of pulmonary fibrotic disease prior to initiation of Esbriet April 2023 CT chest  Lungs/Pleura: Asymmetric scarring and volume loss of the right lung  base with elevation of the right hemidiaphragm, featuring irregular  peripheral interstitial opacity and ground-glass, arcing subpleural  elements, and evidence of subpleural sparing (series 5, image 115).  There are multiple small bilateral pulmonary nodules, the largest in  the right lower lobe measuring 0.7 cm (series 5, image 110). Mild,  lobular air trapping on expiratory phase imaging. No pleural  effusion or pneumothorax.   Progressive Pulmonary fibrosis -  She does have rheumatoid arthritis and this is associated with UIP pattern of ILD as well as other subtypes -She wishes to start  Antifibrotic - we have not been able to get approval by insurance  -RA associcated ILD and pulmonary fibrosis  Morbid obesity BMI >38 -Recommend dietary changes and bariatric evaluation -We discussed changes in diet - recommend reduction in PO intake with fluid restriction -she lost 40lbs with lifestyle modification and some of the loss was fluid.    Reproductive/Obstetrics                             Anesthesia Physical Anesthesia Plan  ASA: 3  Anesthesia Plan: General   Post-op Pain Management: Regional block*   Induction: Intravenous  PONV Risk Score and Plan: 3 and Ondansetron, Dexamethasone and Midazolam  Airway Management Planned: Oral ETT  Additional Equipment:   Intra-op Plan:   Post-operative Plan: Extubation in OR  Informed Consent: I have reviewed the patients History and Physical, chart, labs and discussed the procedure including the risks, benefits and alternatives for the proposed anesthesia with the patient or authorized representative who has indicated his/her understanding and acceptance.     Dental advisory given  Plan Discussed with: CRNA, Surgeon and Anesthesiologist  Anesthesia Plan Comments: (Patient consented for risks of anesthesia including but not limited to:  - adverse reactions to medications - damage to eyes, teeth, lips or other oral mucosa - nerve damage due to positioning  - sore throat or hoarseness - damage to heart, brain, nerves, lungs, other parts of body or loss of life  Informed patient about role of CRNA in peri- and intra-operative care.  Patient voiced understanding.)        Anesthesia Quick Evaluation

## 2023-08-30 NOTE — Evaluation (Signed)
Occupational Therapy Evaluation Patient Details Name: OLUWATAMILORE MARTELLO MRN: [id number] DOB: [birth year] Today's Date: 08/30/2023   History of Present Illness Pt is s/p R RSA on 09/09/23.  PMH includes: anxiety, depression, GERD, renal disease, arthritis, CKD, HTN, and rheumatoid arthritis   Clinical Impression   Ms Hannen was seen for OT evaluation this date. Prior to hospital admission, pt was MOD I using SPC. Pt lives with spouse, son, daughter, and 3 adult grandchildren. Pt currently requires  MAX A don/doff shirt and sling in sitting. MOD A don underwear and shorts. Pt instructed in polar care mgt, compression stockings mgt, sling/immobilizer mgt, ROM exercises for RUE (with instructions for no shoulder exercises until full sensation has returned), RUE precautions, and adaptive strategies for bathing/dressing. Pt would benefit from skilled OT to address noted impairments and functional limitations (see below for any additional details). Upon hospital discharge, recommend no OT follow up.    If plan is discharge home, recommend the following: A little help with walking and/or transfers;A little help with bathing/dressing/bathroom    Functional Status Assessment  Patient has had a recent decline in their functional status and demonstrates the ability to make significant improvements in function in a reasonable and predictable amount of time.  Equipment Recommendations  None recommended by OT    Recommendations for Other Services       Precautions / Restrictions Precautions Precautions: Fall;Shoulder Shoulder Interventions: Shoulder abduction pillow;Don joy ultra sling;Off for dressing/bathing/exercises Restrictions Weight Bearing Restrictions: Yes RUE Weight Bearing: Non weight bearing      Mobility Bed Mobility Overal bed mobility: Needs Assistance Bed Mobility: Supine to Sit     Supine to sit: Min assist          Transfers Overall transfer level: Needs  assistance Equipment used: Straight cane Transfers: Sit to/from Stand Sit to Stand: Min assist                  Balance Overall balance assessment: Needs assistance Sitting-balance support: No upper extremity supported, Feet supported Sitting balance-Leahy Scale: Fair     Standing balance support: Single extremity supported Standing balance-Leahy Scale: Poor Standing balance comment: posterior lean                           ADL either performed or assessed with clinical judgement   ADL Overall ADL's : Needs assistance/impaired                                       General ADL Comments: MAX A don/doff shirt and sling in sitting. MOD A don underwear and shorts.      Pertinent Vitals/Pain Pain Assessment Pain Assessment: Faces Faces Pain Scale: Hurts little more Pain Location: R hip Pain Descriptors / Indicators: Dull, Discomfort Pain Intervention(s): Limited activity within patient's tolerance     Extremity/Trunk Assessment Upper Extremity Assessment Upper Extremity Assessment: Right hand dominant;Generalized weakness   Lower Extremity Assessment Lower Extremity Assessment: Generalized weakness       Communication Communication Communication: No apparent difficulties Cueing Techniques: Verbal cues   Cognition Arousal: Alert Behavior During Therapy: WFL for tasks assessed/performed Overall Cognitive Status: Within Functional Limits for tasks assessed  Home Living Family/patient expects to be discharged to:: Private residence Living Arrangements: Spouse/significant other;Other relatives;Children Available Help at Discharge: Family;Available 24 hours/day Type of Home: Mobile home Home Access: Stairs to enter Entrance Stairs-Number of Steps: 3 Entrance Stairs-Rails: Can reach both Home Layout: One level               Home Equipment: Agricultural consultant (2  wheels);Cane - single point          Prior Functioning/Environment Prior Level of Function : Independent/Modified Independent                        OT Problem List: Decreased strength;Decreased range of motion;Decreased activity tolerance;Impaired balance (sitting and/or standing);Decreased safety awareness;Impaired UE functional use      OT Treatment/Interventions: Self-care/ADL training;Therapeutic exercise;Energy conservation;DME and/or AE instruction;Therapeutic activities;Patient/family education    OT Goals(Current goals can be found in the care plan section) Acute Rehab OT Goals Patient Stated Goal: to go home OT Goal Formulation: With patient Time For Goal Achievement: 09/13/23 Potential to Achieve Goals: Good ADL Goals Pt Will Perform Grooming: with set-up;with supervision;standing Pt Will Perform Lower Body Dressing: with min assist;sit to/from stand Pt Will Transfer to Toilet: with supervision;ambulating;regular height toilet  OT Frequency: Min 1X/week    Co-evaluation              AM-PAC OT "6 Clicks" Daily Activity     Outcome Measure Help from another person eating meals?: None Help from another person taking care of personal grooming?: A Little Help from another person toileting, which includes using toliet, bedpan, or urinal?: A Little Help from another person bathing (including washing, rinsing, drying)?: A Lot Help from another person to put on and taking off regular upper body clothing?: A Lot Help from another person to put on and taking off regular lower body clothing?: A Lot 6 Click Score: 16   End of Session Nurse Communication: Mobility status  Activity Tolerance: Patient tolerated treatment well Patient left: in bed;with call bell/phone within reach  OT Visit Diagnosis: Other abnormalities of gait and mobility (R26.89);Muscle weakness (generalized) (M62.81)                Time: 7829-5621 OT Time Calculation (min): 24 min Charges:   OT General Charges $OT Visit: 1 Visit OT Evaluation $OT Eval Low Complexity: 1 Low  Kathie Dike, M.S. OTR/L  08/30/23, 3:56 PM  ascom [phone]

## 2023-08-30 NOTE — Consult Note (Signed)
PHARMACY NOTE:  ANTIMICROBIAL RENAL DOSAGE ADJUSTMENT  Current antimicrobial regimen includes a mismatch between antimicrobial dosage and estimated renal function.  As per policy approved by the Pharmacy & Therapeutics and Medical Executive Committees, the antimicrobial dosage will be adjusted accordingly.  Current antimicrobial dosage: cefazolin 2g Q6H x3 doses  Indication: post-op  Renal Function:  Estimated Creatinine Clearance: 20.7 mL/min (A) (by C-G formula based on SCr of 2.45 mg/dL (H)). []      On intermittent HD, scheduled: []      On CRRT    Antimicrobial dosage has been changed to: cefazolin 2g Q12H x2 doses  Additional comments:   Thank you for allowing pharmacy to be a part of this patient's care.  Celene Squibb, PharmD Clinical Pharmacist 08/30/2023 1:33 PM

## 2023-08-30 NOTE — Anesthesia Procedure Notes (Signed)
Anesthesia Regional Block: Interscalene brachial plexus block   Pre-Anesthetic Checklist: , timeout performed,  Correct Patient, Correct Site, Correct Laterality,  Correct Procedure, Correct Position, site marked,  Risks and benefits discussed,  Surgical consent,  Pre-op evaluation,  At surgeon's request and post-op pain management  Laterality: Upper and Right  Prep: chloraprep       Needles:  Injection technique: Single-shot  Needle Type: Stimiplex     Needle Length: 9cm  Needle Gauge: 22     Additional Needles:   Procedures:,,,, ultrasound used (permanent image in chart),,    Narrative:  Start time: 08/30/2023 8:46 AM End time: 08/30/2023 8:50 AM Injection made incrementally with aspirations every 5 mL.  Performed by: Personally  Anesthesiologist: Foye Deer, MD  Additional Notes: Patient consented for risk and benefits of nerve block including but not limited to nerve damage, failed block, bleeding and infection.  Patient voiced understanding.  Functioning IV was confirmed and monitors were applied.  Timeout done prior to procedure and prior to any sedation being given to the patient.  Patient confirmed procedure site prior to any sedation given to the patient. Sterile prep,hand hygiene and sterile gloves were used.  Minimal sedation used for procedure.  No paresthesia endorsed by patient during the procedure.  Negative aspiration and negative test dose prior to incremental administration of local anesthetic. The patient tolerated the procedure well with no immediate complications.

## 2023-08-31 ENCOUNTER — Observation Stay: Payer: Medicare HMO

## 2023-08-31 ENCOUNTER — Inpatient Hospital Stay: Payer: Medicare HMO

## 2023-08-31 ENCOUNTER — Encounter: Payer: Self-pay | Admitting: Orthopedic Surgery

## 2023-08-31 ENCOUNTER — Inpatient Hospital Stay (HOSPITAL_COMMUNITY)
Admission: RE | Admit: 2023-08-31 | Discharge: 2023-08-31 | Disposition: A | Discharge status: Home / Self Care | Payer: Medicare HMO | Source: Home / Self Care | Attending: Internal Medicine | Admitting: Internal Medicine

## 2023-08-31 DIAGNOSIS — Z9189 Other specified personal risk factors, not elsewhere classified: Secondary | ICD-10-CM

## 2023-08-31 DIAGNOSIS — J84112 Idiopathic pulmonary fibrosis: Secondary | ICD-10-CM | POA: Diagnosis present

## 2023-08-31 DIAGNOSIS — F32A Depression, unspecified: Secondary | ICD-10-CM | POA: Diagnosis present

## 2023-08-31 DIAGNOSIS — G4733 Obstructive sleep apnea (adult) (pediatric): Secondary | ICD-10-CM | POA: Insufficient documentation

## 2023-08-31 DIAGNOSIS — E785 Hyperlipidemia, unspecified: Secondary | ICD-10-CM | POA: Diagnosis present

## 2023-08-31 DIAGNOSIS — R0609 Other forms of dyspnea: Secondary | ICD-10-CM

## 2023-08-31 DIAGNOSIS — R7989 Other specified abnormal findings of blood chemistry: Secondary | ICD-10-CM | POA: Insufficient documentation

## 2023-08-31 DIAGNOSIS — J9601 Acute respiratory failure with hypoxia: Secondary | ICD-10-CM | POA: Diagnosis present

## 2023-08-31 DIAGNOSIS — M75101 Unspecified rotator cuff tear or rupture of right shoulder, not specified as traumatic: Secondary | ICD-10-CM | POA: Diagnosis present

## 2023-08-31 DIAGNOSIS — F419 Anxiety disorder, unspecified: Secondary | ICD-10-CM | POA: Diagnosis present

## 2023-08-31 DIAGNOSIS — Z96611 Presence of right artificial shoulder joint: Secondary | ICD-10-CM | POA: Diagnosis not present

## 2023-08-31 DIAGNOSIS — N2889 Other specified disorders of kidney and ureter: Secondary | ICD-10-CM

## 2023-08-31 DIAGNOSIS — D631 Anemia in chronic kidney disease: Secondary | ICD-10-CM | POA: Diagnosis present

## 2023-08-31 DIAGNOSIS — I471 Supraventricular tachycardia, unspecified: Secondary | ICD-10-CM | POA: Diagnosis not present

## 2023-08-31 DIAGNOSIS — I2510 Atherosclerotic heart disease of native coronary artery without angina pectoris: Secondary | ICD-10-CM | POA: Diagnosis present

## 2023-08-31 DIAGNOSIS — M79604 Pain in right leg: Secondary | ICD-10-CM | POA: Insufficient documentation

## 2023-08-31 DIAGNOSIS — G8929 Other chronic pain: Secondary | ICD-10-CM | POA: Diagnosis present

## 2023-08-31 DIAGNOSIS — Z8249 Family history of ischemic heart disease and other diseases of the circulatory system: Secondary | ICD-10-CM | POA: Diagnosis not present

## 2023-08-31 DIAGNOSIS — I130 Hypertensive heart and chronic kidney disease with heart failure and stage 1 through stage 4 chronic kidney disease, or unspecified chronic kidney disease: Secondary | ICD-10-CM | POA: Diagnosis present

## 2023-08-31 DIAGNOSIS — N184 Chronic kidney disease, stage 4 (severe): Secondary | ICD-10-CM | POA: Diagnosis present

## 2023-08-31 DIAGNOSIS — R0989 Other specified symptoms and signs involving the circulatory and respiratory systems: Secondary | ICD-10-CM | POA: Diagnosis not present

## 2023-08-31 DIAGNOSIS — Z85528 Personal history of other malignant neoplasm of kidney: Secondary | ICD-10-CM | POA: Diagnosis not present

## 2023-08-31 DIAGNOSIS — R918 Other nonspecific abnormal finding of lung field: Secondary | ICD-10-CM | POA: Diagnosis not present

## 2023-08-31 DIAGNOSIS — Z471 Aftercare following joint replacement surgery: Secondary | ICD-10-CM | POA: Diagnosis not present

## 2023-08-31 DIAGNOSIS — Z96651 Presence of right artificial knee joint: Secondary | ICD-10-CM | POA: Diagnosis present

## 2023-08-31 DIAGNOSIS — I2489 Other forms of acute ischemic heart disease: Secondary | ICD-10-CM | POA: Diagnosis present

## 2023-08-31 DIAGNOSIS — R0602 Shortness of breath: Secondary | ICD-10-CM | POA: Diagnosis not present

## 2023-08-31 DIAGNOSIS — D72829 Elevated white blood cell count, unspecified: Secondary | ICD-10-CM | POA: Insufficient documentation

## 2023-08-31 DIAGNOSIS — Z79899 Other long term (current) drug therapy: Secondary | ICD-10-CM | POA: Diagnosis not present

## 2023-08-31 DIAGNOSIS — R079 Chest pain, unspecified: Secondary | ICD-10-CM | POA: Diagnosis not present

## 2023-08-31 DIAGNOSIS — D84821 Immunodeficiency due to drugs: Secondary | ICD-10-CM | POA: Diagnosis present

## 2023-08-31 DIAGNOSIS — I5033 Acute on chronic diastolic (congestive) heart failure: Secondary | ICD-10-CM | POA: Diagnosis present

## 2023-08-31 DIAGNOSIS — G2581 Restless legs syndrome: Secondary | ICD-10-CM | POA: Diagnosis present

## 2023-08-31 DIAGNOSIS — D509 Iron deficiency anemia, unspecified: Secondary | ICD-10-CM | POA: Diagnosis present

## 2023-08-31 DIAGNOSIS — M059 Rheumatoid arthritis with rheumatoid factor, unspecified: Secondary | ICD-10-CM | POA: Diagnosis present

## 2023-08-31 DIAGNOSIS — J690 Pneumonitis due to inhalation of food and vomit: Secondary | ICD-10-CM | POA: Diagnosis present

## 2023-08-31 LAB — URINALYSIS, COMPLETE (UACMP) WITH MICROSCOPIC
Bilirubin Urine: NEGATIVE
Glucose, UA: NEGATIVE mg/dL
Hgb urine dipstick: NEGATIVE
Ketones, ur: NEGATIVE mg/dL
Nitrite: NEGATIVE
Protein, ur: NEGATIVE mg/dL
Specific Gravity, Urine: 1.008 (ref 1.005–1.030)
pH: 5 (ref 5.0–8.0)

## 2023-08-31 LAB — TROPONIN I (HIGH SENSITIVITY)
Troponin I (High Sensitivity): 145 ng/L (ref <18)
Troponin I (High Sensitivity): 154 ng/L (ref <18)
Troponin I (High Sensitivity): 156 ng/L (ref <18)

## 2023-08-31 LAB — CBC WITH DIFFERENTIAL/PLATELET
Abs Immature Granulocytes: 0.1 10*3/uL — ABNORMAL HIGH (ref 0.00–0.07)
Basophils Absolute: 0 10*3/uL (ref 0.0–0.1)
Basophils Relative: 0 %
Eosinophils Absolute: 0.1 10*3/uL (ref 0.0–0.5)
Eosinophils Relative: 1 %
HCT: 28.5 % — ABNORMAL LOW (ref 36.0–46.0)
Hemoglobin: 8.8 g/dL — ABNORMAL LOW (ref 12.0–15.0)
Immature Granulocytes: 1 %
Lymphocytes Relative: 10 %
Lymphs Abs: 1.6 10*3/uL (ref 0.7–4.0)
MCH: 29.5 pg (ref 26.0–34.0)
MCHC: 30.9 g/dL (ref 30.0–36.0)
MCV: 95.6 fL (ref 80.0–100.0)
Monocytes Absolute: 1.4 10*3/uL — ABNORMAL HIGH (ref 0.1–1.0)
Monocytes Relative: 9 %
Neutro Abs: 12.3 10*3/uL — ABNORMAL HIGH (ref 1.7–7.7)
Neutrophils Relative %: 79 %
Platelets: 245 10*3/uL (ref 150–400)
RBC: 2.98 MIL/uL — ABNORMAL LOW (ref 3.87–5.11)
RDW: 14.8 % (ref 11.5–15.5)
WBC: 15.5 10*3/uL — ABNORMAL HIGH (ref 4.0–10.5)
nRBC: 0 % (ref 0.0–0.2)

## 2023-08-31 LAB — APTT: aPTT: 31 s (ref 24–36)

## 2023-08-31 LAB — BASIC METABOLIC PANEL WITH GFR
Anion gap: 9 (ref 5–15)
BUN: 39 mg/dL — ABNORMAL HIGH (ref 8–23)
CO2: 23 mmol/L (ref 22–32)
Calcium: 8.3 mg/dL — ABNORMAL LOW (ref 8.9–10.3)
Chloride: 106 mmol/L (ref 98–111)
Creatinine, Ser: 1.98 mg/dL — ABNORMAL HIGH (ref 0.44–1.00)
GFR, Estimated: 27 mL/min — ABNORMAL LOW (ref >60)
Glucose, Bld: 110 mg/dL — ABNORMAL HIGH (ref 70–99)
Potassium: 3.8 mmol/L (ref 3.5–5.1)
Sodium: 138 mmol/L (ref 135–145)

## 2023-08-31 LAB — HEPATIC FUNCTION PANEL
ALT: 38 U/L (ref 0–44)
AST: 69 U/L — ABNORMAL HIGH (ref 15–41)
Albumin: 3.2 g/dL — ABNORMAL LOW (ref 3.5–5.0)
Alkaline Phosphatase: 84 U/L (ref 38–126)
Bilirubin, Direct: <0.1 mg/dL (ref 0.0–0.2)
Total Bilirubin: 0.6 mg/dL (ref 0.3–1.2)
Total Protein: 7.2 g/dL (ref 6.5–8.1)

## 2023-08-31 LAB — BRAIN NATRIURETIC PEPTIDE: B Natriuretic Peptide: 292.5 pg/mL — ABNORMAL HIGH (ref 0.0–100.0)

## 2023-08-31 LAB — PROTIME-INR
INR: 1.1 (ref 0.8–1.2)
Prothrombin Time: 14.5 s (ref 11.4–15.2)

## 2023-08-31 LAB — D-DIMER, QUANTITATIVE: D-Dimer, Quant: 1.23 ug{FEU}/mL — ABNORMAL HIGH (ref 0.00–0.50)

## 2023-08-31 LAB — LACTIC ACID, PLASMA
Lactic Acid, Venous: 1.7 mmol/L (ref 0.5–1.9)
Lactic Acid, Venous: 1.3 mmol/L (ref 0.5–1.9)

## 2023-08-31 LAB — PROCALCITONIN: Procalcitonin: <0.1 ng/mL

## 2023-08-31 MED ORDER — VITAMIN D 25 MCG (1000 UNIT) PO TABS
ORAL_TABLET | ORAL | Status: AC
  Filled 2023-08-31: qty 2

## 2023-08-31 MED ORDER — FUROSEMIDE 10 MG/ML IJ SOLN
20.0000 mg | Freq: Two times a day (BID) | INTRAMUSCULAR | Status: AC
  Administered 2023-08-31 – 2023-09-01 (×2): 20 mg via INTRAVENOUS
  Filled 2023-08-31 (×3): qty 2

## 2023-08-31 MED ORDER — ONDANSETRON HCL 4 MG PO TABS: 4.0000 mg | ORAL_TABLET | Freq: Four times a day (QID) | ORAL | 0 refills | Status: DC | PRN

## 2023-08-31 MED ORDER — SODIUM CHLORIDE 0.9 % IV SOLN
1.0000 g | INTRAVENOUS | Status: DC
  Administered 2023-08-31 – 2023-09-01 (×2): 1 g via INTRAVENOUS
  Filled 2023-08-31 (×3): qty 10

## 2023-08-31 MED ORDER — ACETAMINOPHEN 650 MG RE SUPP: 650.0000 mg | Freq: Four times a day (QID) | RECTAL | Status: DC | PRN

## 2023-08-31 MED ORDER — ACETAMINOPHEN 500 MG PO TABS
ORAL_TABLET | ORAL | Status: AC
  Filled 2023-08-31: qty 2

## 2023-08-31 MED ORDER — TECHNETIUM TO 99M ALBUMIN AGGREGATED
4.0000 | Freq: Once | INTRAVENOUS | Status: AC | PRN
  Administered 2023-08-31: 4.17 via INTRAVENOUS

## 2023-08-31 MED ORDER — CEFAZOLIN SODIUM-DEXTROSE 2-4 GM/100ML-% IV SOLN
INTRAVENOUS | Status: AC
  Filled 2023-08-31: qty 100

## 2023-08-31 MED ORDER — HYDROCHLOROTHIAZIDE 25 MG PO TABS
ORAL_TABLET | ORAL | Status: AC
  Filled 2023-08-31: qty 1

## 2023-08-31 MED ORDER — ACETAMINOPHEN 325 MG PO TABS
650.0000 mg | ORAL_TABLET | Freq: Four times a day (QID) | ORAL | Status: DC | PRN
  Administered 2023-08-31 – 2023-09-01 (×3): 650 mg via ORAL
  Filled 2023-08-31 (×3): qty 2

## 2023-08-31 MED ORDER — PANTOPRAZOLE SODIUM 40 MG PO TBEC
DELAYED_RELEASE_TABLET | ORAL | Status: AC
  Filled 2023-08-31: qty 2

## 2023-08-31 MED ORDER — FUROSEMIDE 20 MG PO TABS
ORAL_TABLET | ORAL | Status: AC
  Filled 2023-08-31: qty 3

## 2023-08-31 MED ORDER — HEPARIN SODIUM (PORCINE) 5000 UNIT/ML IJ SOLN: 5000.0000 [IU] | Freq: Three times a day (TID) | INTRAMUSCULAR | Status: DC

## 2023-08-31 MED ORDER — ASPIRIN 325 MG PO TBEC
DELAYED_RELEASE_TABLET | ORAL | Status: AC
  Filled 2023-08-31: qty 1

## 2023-08-31 MED ORDER — HEPARIN BOLUS VIA INFUSION
4000.0000 [IU] | Freq: Once | INTRAVENOUS | Status: AC
  Administered 2023-08-31: 4000 [IU] via INTRAVENOUS
  Filled 2023-08-31: qty 4000

## 2023-08-31 MED ORDER — ONDANSETRON HCL 4 MG/2ML IJ SOLN: 4.0000 mg | Freq: Four times a day (QID) | INTRAMUSCULAR | Status: DC | PRN

## 2023-08-31 MED ORDER — ACETAMINOPHEN 500 MG PO TABS: 1000.0000 mg | ORAL_TABLET | Freq: Four times a day (QID) | ORAL | 0 refills | Status: DC | PRN

## 2023-08-31 MED ORDER — DOCUSATE SODIUM 100 MG PO CAPS
ORAL_CAPSULE | ORAL | Status: AC
  Filled 2023-08-31: qty 1

## 2023-08-31 MED ORDER — OXYCODONE HCL 5 MG PO TABS
5.0000 mg | ORAL_TABLET | ORAL | Status: AC | PRN
  Administered 2023-08-31 – 2023-09-02 (×5): 5 mg via ORAL
  Filled 2023-08-31 (×5): qty 1

## 2023-08-31 MED ORDER — HYDROMORPHONE HCL 1 MG/ML IJ SOLN
0.2000 mg | INTRAMUSCULAR | Status: AC | PRN
  Administered 2023-09-01: 0.4 mg via INTRAVENOUS
  Filled 2023-08-31: qty 0.5

## 2023-08-31 MED ORDER — HEPARIN (PORCINE) 25000 UT/250ML-% IV SOLN
1500.0000 [IU]/h | INTRAVENOUS | Status: DC
  Administered 2023-08-31: 800 [IU]/h via INTRAVENOUS
  Administered 2023-09-01: 1200 [IU]/h via INTRAVENOUS
  Administered 2023-09-02: 1350 [IU]/h via INTRAVENOUS
  Filled 2023-08-31 (×4): qty 250

## 2023-08-31 MED ORDER — FERROUS SULFATE 325 (65 FE) MG PO TABS
ORAL_TABLET | ORAL | Status: AC
  Filled 2023-08-31: qty 1

## 2023-08-31 MED ORDER — ASPIRIN 325 MG PO TBEC: 325.0000 mg | DELAYED_RELEASE_TABLET | Freq: Every day | ORAL | 0 refills | Status: DC

## 2023-08-31 MED ORDER — SENNOSIDES-DOCUSATE SODIUM 8.6-50 MG PO TABS
1.0000 | ORAL_TABLET | Freq: Every day | ORAL | Status: DC
  Administered 2023-08-31 – 2023-09-01 (×2): 1 via ORAL
  Filled 2023-08-31 (×2): qty 1

## 2023-08-31 MED ORDER — OXYCODONE HCL 5 MG PO TABS: 5.0000 mg | ORAL_TABLET | ORAL | 0 refills | Status: DC | PRN

## 2023-08-31 NOTE — Progress Notes (Signed)
PT Cancellation Note  Patient Details Name: Tonya Cummings MRN: [id number] DOB: [birth year]   Cancelled Treatment:    Reason Eval/Treat Not Completed: Medical issues which prohibited therapy: Per chart review pt with elevated troponin and being transferred to a higher level of care.  Per protocol will complete PT orders at this time but will reassess pt pending a change in status upon receipt of new PT orders.   Ovidio Hanger PT, DPT 08/31/23, 3:18 PM

## 2023-08-31 NOTE — Progress Notes (Signed)
Physical Therapy Treatment Patient Details Name: Tonya Cummings MRN: [id number] DOB: Oct 20, 1951 Today's Date: 08/31/2023   History of Present Illness Pt is a 72 yo F diagnosed with R shoulder rotator cuff arthropathy and is s/p R reverse total shoulder arthroplasty and biceps tenodesis.  PMH includes: CAD, CHF, hiatal hernia, anxiety, depression, GERD, renal disease, arthritis, CKD, HTN, and rheumatoid arthritis.    PT Comments  Pt was pleasant and motivated to participate during the session and put forth good effort throughout. Pt found supine in bed with family (spouse) in room. Pt supervision for bed mobility and STS transfers, performed with good carryover from prior session maintaining RUE precautions. Upon sitting up to EOB  SpO2 reading at 83% on room air, quickly rose back to 92%. Pt able to walk 70 feet to stairs and take seated rest break while being educated on stair training, spouse present. Pt able to perform stairs with constant LUE support on hand rails, once finished pt sat down due to SoB, SpO2 dropped to a low of 82% on room air, but quickly rose to low 90's within ~1 min, nursing and MD notified. Pt never had concerns of dizziness for lightheadedness throughout. Pt will benefit from continued PT services upon discharge to safely address deficits listed in patient problem list for decreased caregiver assistance and eventual return to PLOF.    If plan is discharge home, recommend the following: A lot of help with bathing/dressing/bathroom;Help with stairs or ramp for entrance;Assist for transportation;Assistance with cooking/housework;A little help with walking and/or transfers   Can travel by private vehicle        Equipment Recommendations  None recommended by PT    Recommendations for Other Services       Precautions / Restrictions Precautions Precautions: Fall;Shoulder Shoulder Interventions: Shoulder abduction pillow;Don joy ultra sling;Off for  dressing/bathing/exercises Restrictions Weight Bearing Restrictions: Yes RUE Weight Bearing: Non weight bearing     Mobility  Bed Mobility Overal bed mobility: Needs Assistance Bed Mobility: Supine to Sit     Supine to sit: Supervision, HOB elevated          Transfers Overall transfer level: Needs assistance Equipment used: Straight cane Transfers: Sit to/from Stand Sit to Stand: Supervision           General transfer comment: Pt able to stand with no cues needed, slightly slow to stand but able to perform with no physical assist    Ambulation/Gait Ambulation/Gait assistance: Contact guard assist Gait Distance (Feet): 70 Feet Assistive device: Straight cane Gait Pattern/deviations: Step-through pattern, Wide base of support, Decreased step length - right, Decreased step length - left, Decreased stride length Gait velocity: decreased     General Gait Details: Pt taking steady steps, no imbalance seen, uopn end of bout pt having SoB, see PT comments for vitals assessment   Stairs Stairs: Yes Stairs assistance: Supervision Stair Management: One rail Left (to left of pt ascending and descending) Number of Stairs: 4 General stair comments: Pt able to perform stairs utilizing the railsto the pt's left with LUE ascending and descending. Pt having good eccentric and concentric control, reports perofrm stairs similar to how she normally does. Pt having some slight SoB after stairs as well, opted for seated rest break to recover; see PT comments   Wheelchair Mobility     Tilt Bed    Modified Rankin (Stroke Patients Only)       Balance Overall balance assessment: Needs assistance Sitting-balance support: No upper extremity supported, Feet supported  Sitting balance-Leahy Scale: Good     Standing balance support: Single extremity supported Standing balance-Leahy Scale: Fair Standing balance comment: Static and dynamic movmenet with SPC                             Cognition Arousal: Alert Behavior During Therapy: WFL for tasks assessed/performed Overall Cognitive Status: Within Functional Limits for tasks assessed                                          Exercises Other Exercises Other Exercises: Pt education on stair navigation and car transfers, pt's spouse present for both, both verbalizing understanding.    General Comments        Pertinent Vitals/Pain Pain Assessment Pain Assessment: 0-10 Pain Score: 3  Pain Location: R shoulder, R hip Pain Descriptors / Indicators: Dull, Discomfort Pain Intervention(s): Monitored during session, Limited activity within patient's tolerance    Home Living                          Prior Function            PT Goals (current goals can now be found in the care plan section) Progress towards PT goals: Progressing toward goals    Frequency    BID      PT Plan      Co-evaluation              AM-PAC PT "6 Clicks" Mobility   Outcome Measure  Help needed turning from your back to your side while in a flat bed without using bedrails?: A Little Help needed moving from lying on your back to sitting on the side of a flat bed without using bedrails?: A Little Help needed moving to and from a bed to a chair (including a wheelchair)?: A Little Help needed standing up from a chair using your arms (e.g., wheelchair or bedside chair)?: A Little Help needed to walk in hospital room?: A Little Help needed climbing 3-5 steps with a railing? : A Little 6 Click Score: 18    End of Session Equipment Utilized During Treatment: Gait belt Activity Tolerance: Patient tolerated treatment well Patient left: in bed;with bed alarm set;with call bell/phone within reach;with SCD's reapplied;with family/visitor present Nurse Communication: Mobility status (SpO2 values; see PT comments) PT Visit Diagnosis: Other abnormalities of gait and mobility (R26.89);Difficulty  in walking, not elsewhere classified (R26.2)     Time: 6387-5643 PT Time Calculation (min) (ACUTE ONLY): 28 min  Charges:                            Cecile Sheerer, SPT 08/31/23, 3:07 PM

## 2023-08-31 NOTE — Assessment & Plan Note (Addendum)
High sensitive troponin elevated at 145. With recent orthopedic procedure and right renal mass suspicious for renal cell carcinoma, PE cannot be excluded at this time Complete echo ordered CTA PE not ordered as patient has CKD stage IV Will continue to follow second high since troponin

## 2023-08-31 NOTE — Assessment & Plan Note (Signed)
Rosuvastatin 5 mg nightly

## 2023-08-31 NOTE — Progress Notes (Signed)
Pts Troponin 145, MD [facility] notified. Pt placed on tele monitor. Orders to transfer pt to tele floor.

## 2023-08-31 NOTE — Consult Note (Addendum)
Initial Consultation Note    Patient: Tonya Cummings UEA:[id number] DOB: Dec 19, 1950 PCP: Myrene Buddy, NP DOA: 08/30/2023 DOS: the patient was seen and examined on 08/31/2023  Referring physician: Dr. Signa Kell Reason for consult: oxygen desaturation  I have personally briefly reviewed patient's old medical records in [facility] EMR.  Chief Concern: shortness of breath with oxygen desaturation  HPI: Tonya Cummings is a 72 year old female with history of idiopathic pulmonary fibrosis, history of right renal mass favoring renal cell carcinoma, CKD stage IIIb/IV, chronic constipation, anxiety, heart failure preserved ejection fraction, anxiety, mixed iron deficiency anemia and anemia presumed secondary to stage IV 6 ED, history of hip dysplasia, hyperlipidemia, hypertension, insomnia, osteoarthritis, obesity, obstructive sleep apnea with history of nonutilization of CPAP therapy, osteoporosis, Raynaud's disease without gangrene, restless leg syndrome, rheumatoid arthritis, coronary artery disease, history of thoracic compression fracture, who was a direct admission under orthopedic service for right shoulder rotator cuff injury requiring RT reverse shoulder arthroplasty and right biceps tenodesis.  Patient is postop day 1.  Per orthopedic primary service, patient was participating with physical therapy and was walking up stairs when her oxygen saturation desatted to the mid 80s.  At baseline patient does not require O2 supplementation.  Patient is currently requiring 2 L nasal cannula.  Most recent vitals at the time of consultation showed T of 97.8, RR of 16, HR of 96, blood pressure 139/70, SpO2 of 99% on 2 L nasal cannula.  Portable chest x-ray ordered by orthopedic team was read as patchy by bilateral reticulonodular and airspace opacity, differential includes pulmonary edema versus aspiration, less likely infection.  Labs ordered by orthopedic team on day of  consultation was remarkable for WBC elevation at 12.2, hemoglobin of 8.0, platelet 216. Serum sodium is 140, potassium 4.4, chloride 106, bicarb 25, BUN of 36, serum creatinine 1.86, eGFR of 29, nonfasting blood glucose 124.  Hospitalist service was consulted for acute hypoxic respiratory failure, new onset. ---------------------------------- At bedside, patient was able to tell me his name, age, location, current calendar year.  Patient appears weak and frail.  She is lying in bed with 2 L nasal cannula oxygen supplementation in place and right upper extremity brace.  Patient reports that she feels better with oxygen in her nose.  She reports that she does not have chest pain currently.  She reports that whenever she feels short of breath and unable to catch her breath, she then has feelings of chest pain.  She reports bilateral leg swelling that is more than normal.  On physical exam, her calves hurt with palpation, patient states this is new for her.  She denies cough, fever, chills, nausea, vomiting, abdominal pain, dysuria, hematuria, diarrhea.  She last had a bowel movement 2 days ago.  Social history: Patient lives at home with her husband and adult son and adult daughter.  She also has grandchildren in the home.  She denies tobacco, EtOH, recreational drug use.  She is retired and formally worked in Physiological scientist.  ROS: Constitutional: no weight change, no fever ENT/Mouth: no sore throat, no rhinorrhea Eyes: no eye pain, no vision changes Cardiovascular: no chest pain, + dyspnea, + edema, no palpitations Respiratory: no cough, no sputum, no wheezing Gastrointestinal: no nausea, no vomiting, no diarrhea, no constipation Genitourinary: no urinary incontinence, no dysuria, no hematuria Musculoskeletal: no arthralgias, no myalgias Skin: no skin lesions, no pruritus, Neuro: + weakness, no loss of consciousness, no syncope Psych: no anxiety, no depression, + decrease  appetite  Heme/Lymph: no bruising, no bleeding  Assessment/Plan  Principal Problem:   Acute hypoxemic respiratory failure (HCC) Active Problems:   Iron deficiency anemia   Chronic radicular pain of lower back   Depression with anxiety   Hip dysplasia   Hyperlipidemia   Hypertension   Raynaud's disease without gangrene   Restless legs syndrome (RLS)   Seropositive rheumatoid arthritis (HCC)   Dyspnea   Long-term use of immunosuppressant medication   Anemia in stage 4 chronic kidney disease (HCC)   Rotator cuff arthropathy   Renal mass, right   Leukocytosis   OSA (obstructive sleep apnea)   Elevated troponin   Leg pain, bilateral   At risk for constipation   Assessment and Plan:  * Acute hypoxemic respiratory failure (HCC) Etiology workup in progress, differentials include pulmonary edema, aspiration pneumonia, PE in setting of surgical procedure requiring anesthesia with comorbidities including right renal mass concerning for renal cell carcinoma, idiopathic pulmonary fibrosis, OSA with non-utilization of PAP therapy Check procalcitonin, added differential to CBC, check LFTs, BNP, high sensitive troponin Addendum: Mild elevation of high sensitive troponin and BNP; complete echo ordered, ultrasound of the lower extremity to assess for DVT CTA PE not ordered as patient has CKD stage IV and right renal mass concerning for RCC Admit to telemetry cardiac, inpatient  At risk for constipation Senna docusate 1 tablet nightly ordered  Leg pain, bilateral With bilateral calf tenderness Right greater than the left In setting of right renal mass suspicious for renal cell carcinoma and recent orthopedic procedure, and acute hypoxic respiratory failure, DVT cannot be excluded at this time Bilateral lower extremity DVT ordered  Elevated troponin High sensitive troponin elevated at 145. With recent orthopedic procedure and right renal mass suspicious for renal cell carcinoma, PE cannot  be excluded at this time Complete echo ordered CTA PE not ordered as patient has CKD stage IV Will continue to follow second high since troponin  OSA (obstructive sleep apnea) History of nonutilization of PAP Given the patient SpO2 desaturated with physical therapy, we will initiate CPAP nightly trial tonight to optimize patient recovery  Leukocytosis Infectious etiology cannot be excluded at this time Workup in progress Check CBC in the a.m.  Renal mass, right Continue outpatient follow-up with urology and planned MRI of the abdomen without contrast as planned  Rotator cuff arthropathy Patient is postop day 1, management per orthopedic team AM team to reconsult PT, OT when medically appropriate  Anemia in stage 4 chronic kidney disease (HCC) At baseline  Dyspnea Multifactorial in setting of recent surgical procedure requiring anesthesia with idiopathic pulmonary fibrosis, OSA nonutilization of PAP therapy, heart failure preserved ejection fraction, chronic anemia in setting of chronic disease, iron deficiency anemia Treat per above  Seropositive rheumatoid arthritis (HCC) Hydroxychloroquine 200 mg p.o. twice daily  Restless legs syndrome (RLS) Continue home pramipexole dosing as ordered by orthopedic team Home ferrous sulfate resumed  Hypertension Home spironolactone 25 mg daily every morning, enalapril 10 mg daily, hydrochlorothiazide 25 mg daily  Hyperlipidemia Rosuvastatin 5 mg nightly  Depression with anxiety Home mirtazapine 30 mg nightly, venlafaxine 225 mg daily with breakfast  Iron deficiency anemia Continue home ferrous sulfate 325 mg p.o. daily with breakfast  Avoid muscle relaxer given SpO2 desaturation, OSA with non-PAP utilization, and idiopathic pulmonary fibrosis.  Chart reviewed.   Complete echo on 09/30/2021: was read as estimated ejection fraction of 55% to 60%, grade 1 diastolic dysfunction.  DVT prophylaxis: Heparin 5000 units  subcutaneous Code Status: Full code Diet:  Heart diet Family Communication: No Disposition Plan: Pending clinical course Consults called: Orthopedic service Admission status: Telemetry cardiac, inpatient  Past Medical History:  Diagnosis Date   Acute hypoxemic respiratory failure (HCC)    Anemia in stage 4 chronic kidney disease (HCC)    Anginal pain (HCC)    Anxiety    Aortic atherosclerosis (HCC)    CHF (congestive heart failure) (HCC)    a.) TTE 09/30/2021: EF 55-60%, no RWMAs, mild MR, G1DD   Chronic pain    Chronic radicular pain of lower back    CKD (chronic kidney disease), stage IV (HCC)    Complication of anesthesia    a.) delayed emergence following ureteroscopy 07/2023   Coronary artery calcification seen on CT scan    Costochondritis    DDD (degenerative disc disease), lumbar    Depression    Dyspnea    GERD (gastroesophageal reflux disease)    Hiatal hernia    Hip dysplasia    Hyperlipidemia    Hypertension    Insomnia    Iron deficiency    Iron deficiency anemia    Low back pain    Low vitamin B12 level    Lumbar facet joint pain    Migraines    Nose colonized with MRSA 10/03/2020   a.) preop PCR (+) 10/03/2020 prior to RIGHT TKA; b.) preop PCR (+) 08/25/2023 prior to RIGHT REVERSE SHOULDER ARTHROPLASTY; BICEPS TENODESIS   Numbness and tingling of right leg    OA (osteoarthritis)    Obesity    OSA (obstructive sleep apnea)    a.) not currently utilizing nocturnal PAP therapy; positional. PCCM feels as if symptom can be mitigated with diet/exercise/weight loss unless develops significant symptoms.   Osteoporosis    a.) recieves denosumab injections   Pelvic fracture (HCC)    Pneumonia    Pulmonary fibrosis (HCC)    Raynaud's disease without gangrene    Restless leg syndrome    a.) on pramipexole + oral Fe supplementation   Rheumatoid arthritis (HCC)    a.) Tx'd with hydroxychloroquine   Right renal mass 05/28/2023   a.) CT renal 05/28/2023:  5.7 cm  mass in the medial aspect of right kidney   Seasonal allergies    Thoracic compression fracture [facility])    Past Surgical History:  Procedure Laterality Date   ABDOMINAL SURGERY     pt denies   APPENDECTOMY     CARPAL TUNNEL RELEASE Bilateral    CHOLECYSTECTOMY     COLONOSCOPY WITH PROPOFOL N/A 08/15/2020   Procedure: COLONOSCOPY WITH PROPOFOL;  Surgeon: Earline Mayotte, MD;  Location: [facility] ENDOSCOPY;  Service: Endoscopy;  Laterality: N/A;   CYSTOSCOPY W/ RETROGRADES Right 07/18/2023   Procedure: CYSTOSCOPY WITH RETROGRADE PYELOGRAM;  Surgeon: [name], MD;  Location: [facility] ORS;  Service: Urology;  Laterality: Right;   DILATION AND CURETTAGE OF UTERUS     ESOPHAGOGASTRODUODENOSCOPY (EGD) WITH PROPOFOL N/A 08/15/2020   Procedure: ESOPHAGOGASTRODUODENOSCOPY (EGD) WITH PROPOFOL;  Surgeon: Earline Mayotte, MD;  Location: [facility] ENDOSCOPY;  Service: Endoscopy;  Laterality: N/A;   FRACTURE SURGERY     hip fracture    FRACTURE SURGERY     pelvic fracture plate    HIP SURGERY Left    KNEE ARTHROPLASTY Right 10/13/2020   Procedure: COMPUTER ASSISTED TOTAL KNEE ARTHROPLASTY - RNFA;  Surgeon: Donato Heinz, MD;  Location: [facility] ORS;  Service: Orthopedics;  Laterality: Right;   REVERSE SHOULDER ARTHROPLASTY Right 08/30/2023   Procedure: Right reverse shoulder arthroplasty, biceps  tenodesis;  Surgeon: Signa Kell, MD;  Location: [facility] ORS;  Service: Orthopedics;  Laterality: Right;   TUBAL LIGATION     URETEROSCOPY  07/18/2023   Procedure: DIAGNOSTIC URETEROSCOPY;  Surgeon: [name], MD;  Location: [facility] ORS;  Service: Urology;;   Social History:  reports that she has never smoked. She has never used smokeless tobacco. She reports that she does not drink alcohol and does not use drugs.  Allergies  Allergen Reactions   Gabapentin Other (See Comments)    Weight gain   Ibuprofen Other (See Comments)    Headache   Family History  Problem Relation Age of Onset   Diabetes Mother     Hypertension Mother    Aneurysm Father    Diabetes Son    Seizures Son    Osteosarcoma Brother    Cancer Brother    Diabetes Brother    Diabetes Maternal Grandfather    Heart disease Maternal Grandfather    Family history: Family history reviewed and not pertinent.  Prior to Admission medications   Medication Sig Start Date End Date Taking? Authorizing Provider  ferrous sulfate 325 (65 FE) MG tablet Take 325 mg by mouth daily with breakfast.   Yes [provider]  hydroxychloroquine (PLAQUENIL) 200 MG tablet Take 1 tablet by mouth 2 (two) times daily. 06/22/21  Yes [provider]  mupirocin ointment (BACTROBAN) 2 % Apply small about inside of both nostrils TWICE a day for the next 5 days. 08/25/23  Yes Verlee Monte, NP  omeprazole (PRILOSEC) 40 MG capsule Take 40 mg by mouth every morning.   Yes [provider]  pramipexole (MIRAPEX) 0.125 MG tablet Take 0.125 mg by mouth daily. Take 0.125 mg in the morning and 2 at bedtime 02/15/18  Yes [provider]  Venlafaxine HCl 75 MG TB24 Take 1 tablet by mouth every morning. Take daily with 150 mg for total 225 mg daily   Yes [provider]  venlafaxine XR (EFFEXOR-XR) 150 MG 24 hr capsule Take 150 mg by mouth daily with breakfast. Take with 75 mg for total 225 mg daily 12/28/22 12/28/23 Yes [provider]  acetaminophen (TYLENOL) 500 MG tablet Take 2 tablets (1,000 mg total) by mouth every 6 (six) hours as needed. 08/31/23   Anson Oregon, PA-C  Acetaminophen-Caffeine (EXCEDRIN TENSION HEADACHE) 500-65 MG TABS Take 1 tablet by mouth daily as needed (Headache).    [provider]  aspirin EC 325 MG tablet Take 1 tablet (325 mg total) by mouth daily. 08/31/23   Anson Oregon, PA-C  calcitRIOL (ROCALTROL) 0.25 MCG capsule Take 0.25 mcg by mouth every morning. 05/22/21   [provider]  Calcium Carb-Cholecalciferol 600-400 MG-UNIT CAPS Take 1 capsule by mouth 3 (three)  times daily. 01/13/10   [provider]  Cholecalciferol 50 MCG (2000 UT) CAPS Take 2,000 Units by mouth every morning. 09/23/09   [provider]  cyclobenzaprine (FLEXERIL) 10 MG tablet Take 10 mg by mouth at bedtime.    [provider]  enalapril-hydrochlorothiazide (VASERETIC) 10-25 MG tablet Take 1 tablet by mouth at bedtime.    [provider]  furosemide (LASIX) 40 MG tablet Take 60 mg by mouth every morning. 10/08/21   [provider]  mirtazapine (REMERON) 30 MG tablet Take 1 tablet by mouth at bedtime. 06/29/22   [provider]  Multiple Vitamins-Minerals (MULTIVITAMIN ADULT PO) Take 1 tablet by mouth every morning. 09/10/08   [provider]  ondansetron [employer])  4 MG tablet Take 1 tablet (4 mg total) by mouth every 6 (six) hours as needed for nausea. 08/31/23   Anson Oregon, PA-C  oxyCODONE (OXY IR/ROXICODONE) 5 MG immediate release tablet Take 1-2 tablets (5-10 mg total) by mouth every 4 (four) hours as needed for moderate pain or severe pain. 08/31/23   Anson Oregon, PA-C  Pirfenidone 267 MG TABS Take 3 tablets by mouth 3 (three) times daily. 12/24/22   [provider]  PROLIA 60 MG/ML SOSY injection Inject 60 mg into the skin every 6 (six) months. 04/06/23   [provider]  rosuvastatin (CRESTOR) 5 MG tablet Take 5 mg by mouth at bedtime. Take every other night 07/18/19   [provider]  spironolactone (ALDACTONE) 25 MG tablet Take 25 mg by mouth every morning. 04/09/21   [provider]  vitamin B-12 (CYANOCOBALAMIN) 500 MCG tablet Take 500 mcg by mouth every morning.    [provider]   Physical Exam: Vitals:   08/31/23 1326 08/31/23 1434 08/31/23 1452 08/31/23 1542  BP:  133/66 128/73 127/67  Pulse:  87 93 85  Resp:  18 18 18   Temp:      TempSrc:      SpO2: 99% 100% 100% 100%  Weight:      Height:       Constitutional: appears frail, weak, chronically  ill Eyes: PERRL, lids and conjunctivae normal ENMT: Mucous membranes are moist. Posterior pharynx clear of any exudate or lesions. Age-appropriate dentition. Hearing appropriate Neck: normal, supple, no masses, no thyromegaly Respiratory: Generalized decreased lung sounds bilaterally, no wheezing, no crackles. Normal respiratory effort. No accessory muscle use.  Cardiovascular: Regular rate and rhythm, no murmurs / rubs / gallops. No extremity edema. 2+ pedal pulses. No carotid bruits.  Abdomen: no tenderness, no masses palpated, no hepatosplenomegaly. Bowel sounds positive.  Musculoskeletal: no clubbing / cyanosis. No joint deformity upper and lower extremities. Good ROM of left upper extremity and bilateral lower extremities, no contractures, no atrophy. Normal muscle tone.  Decreased range of motion of the right upper extremity due to it being in a brace and recent orthopedic surgery Skin: no rashes, lesions, ulcers. No induration Neurologic: Sensation intact. Strength 5/5 in all 4 Psychiatric: Normal judgment and insight. Alert and oriented x 3.  Depressed mood.   EKG: independently reviewed, showing sinus rhythm with rate of 87, QTc 430  Chest x-ray on Admission: I personally reviewed and I agree with radiologist reading as below.  DG Chest Portable 1 View  Result Date: 08/31/2023 CLINICAL DATA:  hypoxia, chest pain EXAM: PORTABLE CHEST 1 VIEW COMPARISON:  June 15th 2024, October 12, 2021 FINDINGS: The cardiomediastinal silhouette is unchanged and enlarged in contour.Low lung volume radiograph. Elevation of the RIGHT hemidiaphragm, increased in comparison to June 2024. No pleural effusion. No pneumothorax. Patchy bilateral reticulonodular and airspace opacities. Status post RIGHT shoulder arthroplasty IMPRESSION: 1. Patchy bilateral reticulonodular and airspace opacities. Differential considerations include pulmonary edema versus aspiration, less likely infection. 2. Increased elevation of the  RIGHT hemidiaphragm since June 2015, possibly due to technique. Consider PA and lateral chest radiograph when clinically appropriate. Electronically Signed   By: Meda Klinefelter M.D.   On: 08/31/2023 12:54   DG Shoulder Right Port  Result Date: 08/30/2023 CLINICAL DATA:  [id number] Status post shoulder replacement [id number] EXAM: RIGHT SHOULDER - 1 VIEW COMPARISON:  08/11/2023 FINDINGS: Interval postsurgical changes from reverse right shoulder arthroplasty. Arthroplasty components are in their expected alignment. No periprosthetic fracture identified.  Expected postoperative changes within the overlying soft tissues. IMPRESSION: Interval postsurgical changes from reverse right shoulder arthroplasty. No evidence of immediate postoperative complication. Electronically Signed   By: Duanne Guess D.O.   On: 08/30/2023 16:03   [place] OR NERVE BLOCK-IMAGE ONLY [facility])  Result Date: 08/30/2023 There is no interpretation for this exam.  This order is for images obtained during a surgical procedure.  Please See "Surgeries" Tab for more information regarding the procedure.    Labs on Admission: I have personally reviewed following labs  CBC: Recent Labs  Lab 08/25/23 1341 08/31/23 0536 08/31/23 1305  WBC 9.0 12.2* 15.5*  NEUTROABS 6.4  --  12.3*  HGB 8.9* 8.0* 8.8*  HCT 27.9* 25.6* 28.5*  MCV 91.5 95.5 95.6  PLT 252 216 245   Basic Metabolic Panel: Recent Labs  Lab 08/25/23 1341 08/31/23 0536 08/31/23 1313  NA 138 140 138  K 3.7 4.4 3.8  CL 97* 106 106  CO2 27 25 23   GLUCOSE 92 124* 110*  BUN 63* 36* 39*  CREATININE 2.45* 1.86* 1.98*  CALCIUM 8.9 8.2* 8.3*   GFR: Estimated Creatinine Clearance: 25.6 mL/min (A) (by C-G formula based on SCr of 1.98 mg/dL (H)).  Liver Function Tests: Recent Labs  Lab 08/25/23 1341 08/31/23 1313  AST 33 69*  ALT 22 38  ALKPHOS 69 84  BILITOT 0.4 0.6  PROT 7.6 7.2  ALBUMIN 3.6 3.2*   Urine analysis:    Component Value Date/Time   COLORURINE STRAW  (A) 08/25/2023 1341   APPEARANCEUR HAZY (A) 08/25/2023 1341   LABSPEC 1.010 08/25/2023 1341   PHURINE 5.0 08/25/2023 1341   GLUCOSEU NEGATIVE 08/25/2023 1341   HGBUR NEGATIVE 08/25/2023 1341   BILIRUBINUR NEGATIVE 08/25/2023 1341   KETONESUR NEGATIVE 08/25/2023 1341   PROTEINUR NEGATIVE 08/25/2023 1341   NITRITE NEGATIVE 08/25/2023 1341   LEUKOCYTESUR SMALL (A) 08/25/2023 1341   CRITICAL CARE Performed by: Dr. Sedalia Muta  Total critical care time: 35 minutes  Critical care time was exclusive of separately billable procedures and treating other patients.  Critical care was necessary to treat or prevent imminent or life-threatening deterioration.  Critical care was time spent personally by me on the following activities: development of treatment plan with patient and/or surrogate as well as nursing, discussions with consultants, evaluation of patient's response to treatment, examination of patient, obtaining history from patient or surrogate, ordering and performing treatments and interventions, ordering and review of laboratory studies, ordering and review of radiographic studies, pulse oximetry and re-evaluation of patient's condition.  This document was prepared using Dragon Voice Recognition software and may include unintentional dictation errors.  Dr. Sedalia Muta [facility]  If 7PM-7AM, please contact overnight-coverage provider If 7AM-7PM, please contact day attending provider [email or web address]  08/31/2023, 3:51 PM

## 2023-08-31 NOTE — Progress Notes (Signed)
Occupational Therapy Treatment Patient Details Name: Tonya Cummings MRN: [id number] DOB: [birth year] Today's Date: 08/31/2023   History of present illness Pt is a 72 yo F diagnosed with R shoulder rotator cuff arthropathy and is s/p R reverse total shoulder arthroplasty and biceps tenodesis.  PMH includes: CAD, CHF, hiatal hernia, anxiety, depression, GERD, renal disease, arthritis, CKD, HTN, and rheumatoid arthritis.   OT comments  Tonya Cummings was seen for OT treatment on this date. Upon arrival to room pt reclined in bed, family at bedside, agreeable to tx. Pt requires SBA + SPC for toilet t/f, desat 80% on RA with mobility, resolved to 90% with prolonged supine rest. Pt and spouse instructed in polar care mgt, sling/immobilizer mgt, and positioning/considerations for sleep. Handout provided. MIN cues for spouse to don/doff sling and polar care in sitting. Pt making good progress toward goals, will continue to follow POC. Discharge recommendation remains appropriate.        If plan is discharge home, recommend the following:  A little help with walking and/or transfers;A little help with bathing/dressing/bathroom   Equipment Recommendations  None recommended by OT    Recommendations for Other Services      Precautions / Restrictions Precautions Precautions: Fall;Shoulder Shoulder Interventions: Shoulder abduction pillow;Don joy ultra sling;Off for dressing/bathing/exercises Restrictions Weight Bearing Restrictions: Yes RUE Weight Bearing: Non weight bearing       Mobility Bed Mobility Overal bed mobility: Modified Independent                  Transfers Overall transfer level: Needs assistance Equipment used: Straight cane Transfers: Sit to/from Stand Sit to Stand: Supervision                 Balance Overall balance assessment: Needs assistance Sitting-balance support: No upper extremity supported, Feet supported Sitting balance-Leahy Scale: Good      Standing balance support: Single extremity supported Standing balance-Leahy Scale: Fair                             ADL either performed or assessed with clinical judgement   ADL Overall ADL's : Needs assistance/impaired                                       General ADL Comments: MAX A don/doff shirt and sling in sitting. SBA + SPC for toilet t/f      Cognition Arousal: Alert Behavior During Therapy: WFL for tasks assessed/performed Overall Cognitive Status: Within Functional Limits for tasks assessed                                          Exercises Other Exercises Other Exercises: Educated opn polar care, sling mgmt            Pertinent Vitals/ Pain       Pain Assessment Pain Assessment: No/denies pain   Frequency  Min 1X/week        Progress Toward Goals  OT Goals(current goals can now be found in the care plan section)  Progress towards OT goals: Progressing toward goals  Acute Rehab OT Goals Patient Stated Goal: to go home OT Goal Formulation: With patient/family Time For Goal Achievement: 09/13/23 Potential to Achieve Goals: Good ADL Goals Pt Will Perform Grooming:  with set-up;with supervision;standing Pt Will Perform Lower Body Dressing: with min assist;sit to/from stand Pt Will Transfer to Toilet: with supervision;ambulating;regular height toilet   AM-PAC OT "6 Clicks" Daily Activity     Outcome Measure   Help from another person eating meals?: None Help from another person taking care of personal grooming?: None Help from another person toileting, which includes using toliet, bedpan, or urinal?: A Little Help from another person bathing (including washing, rinsing, drying)?: A Lot Help from another person to put on and taking off regular upper body clothing?: A Lot Help from another person to put on and taking off regular lower body clothing?: A Little 6 Click Score: 18    End of Session     OT Visit Diagnosis: Other abnormalities of gait and mobility (R26.89);Muscle weakness (generalized) (M62.81)   Activity Tolerance Patient tolerated treatment well   Patient Left in bed;with call bell/phone within reach;with nursing/sitter in room;with family/visitor present   Nurse Communication Mobility status        Time: 1025-1057 OT Time Calculation (min): 32 min  Charges: OT General Charges $OT Visit: 1 Visit OT Treatments $Self Care/Home Management : 23-37 mins  Kathie Dike, M.S. OTR/L  08/31/23, 11:43 AM  ascom [phone]

## 2023-08-31 NOTE — Assessment & Plan Note (Addendum)
Etiology workup in progress, differentials include pulmonary edema, aspiration pneumonia, PE in setting of surgical procedure requiring anesthesia with comorbidities including right renal mass concerning for renal cell carcinoma, idiopathic pulmonary fibrosis, OSA with non-utilization of PAP therapy Check procalcitonin, added differential to CBC, check LFTs, BNP, high sensitive troponin Addendum: Mild elevation of high sensitive troponin and BNP; complete echo ordered, ultrasound of the lower extremity to assess for DVT CTA PE not ordered as patient has CKD stage IV and right renal mass concerning for RCC Admit to telemetry cardiac, inpatient

## 2023-08-31 NOTE — Progress Notes (Signed)
ANTICOAGULATION CONSULT NOTE - Initial Consult  Pharmacy Consult for Heparin Infusion Indication:  NSTEMI  Allergies  Allergen Reactions   Gabapentin Other (See Comments)    Weight gain   Ibuprofen Other (See Comments)    Headache    Patient Measurements: Height: 5\' 3"  (160 cm) Weight: 74.8 kg (165 lb) IBW/kg (Calculated) : 52.4 Heparin Dosing Weight: 68.3 kg  Vital Signs: Temp: 97.8 F (36.6 C) (09/18 1319) Temp Source: Temporal (09/18 1319) BP: 127/67 (09/18 1542) Pulse Rate: 85 (09/18 1542)  Labs: Recent Labs    08/31/23 0536 08/31/23 1305 08/31/23 1313 08/31/23 1442  HGB 8.0* 8.8*  --   --   HCT 25.6* 28.5*  --   --   PLT 216 245  --   --   CREATININE 1.86*  --  1.98*  --   TROPONINIHS  --   --  145* 154*    Estimated Creatinine Clearance: 25.6 mL/min (A) (by C-G formula based on SCr of 1.98 mg/dL (H)).   Medical History: Past Medical History:  Diagnosis Date   Acute hypoxemic respiratory failure (HCC)    Anemia in stage 4 chronic kidney disease (HCC)    Anginal pain (HCC)    Anxiety    Aortic atherosclerosis (HCC)    CHF (congestive heart failure) (HCC)    a.) TTE 09/30/2021: EF 55-60%, no RWMAs, mild MR, G1DD   Chronic pain    Chronic radicular pain of lower back    CKD (chronic kidney disease), stage IV (HCC)    Complication of anesthesia    a.) delayed emergence following ureteroscopy 07/2023   Coronary artery calcification seen on CT scan    Costochondritis    DDD (degenerative disc disease), lumbar    Depression    Dyspnea    GERD (gastroesophageal reflux disease)    Hiatal hernia    Hip dysplasia    Hyperlipidemia    Hypertension    Insomnia    Iron deficiency    Iron deficiency anemia    Low back pain    Low vitamin B12 level    Lumbar facet joint pain    Migraines    Nose colonized with MRSA 10/03/2020   a.) preop PCR (+) 10/03/2020 prior to RIGHT TKA; b.) preop PCR (+) 08/25/2023 prior to RIGHT REVERSE SHOULDER ARTHROPLASTY;  BICEPS TENODESIS   Numbness and tingling of right leg    OA (osteoarthritis)    Obesity    OSA (obstructive sleep apnea)    a.) not currently utilizing nocturnal PAP therapy; positional. PCCM feels as if symptom can be mitigated with diet/exercise/weight loss unless develops significant symptoms.   Osteoporosis    a.) recieves denosumab injections   Pelvic fracture (HCC)    Pneumonia    Pulmonary fibrosis (HCC)    Raynaud's disease without gangrene    Restless leg syndrome    a.) on pramipexole + oral Fe supplementation   Rheumatoid arthritis (HCC)    a.) Tx'd with hydroxychloroquine   Right renal mass 05/28/2023   a.) CT renal 05/28/2023:  5.7 cm mass in the medial aspect of right kidney   Seasonal allergies    Thoracic compression fracture [facility])      Assessment: Patient is a 72 year old with a past medical history of idiopathic pulmonary fibrosis, history of right renal mass favoring renal cell carcinoma, CKD stage IIIb/IV, chronic constipation, anxiety, heart failure preserved ejection fraction, anxiety, mixed iron deficiency anemia and anemia presumed secondary to stage IV 6  ED, history of hip dysplasia, hyperlipidemia, hypertension, insomnia, osteoarthritis, obesity, obstructive sleep apnea with history of nonutilization of CPAP therapy, osteoporosis, Raynaud's disease without gangrene, restless leg syndrome, rheumatoid arthritis, coronary artery disease, history of thoracic compression fracture. Pharmacy was consulted to initiate patient on a heparin infusion for NSTEMI. Patient was not on anticoagulation prior to admission, therefore ok to monitor with heparin levels.  Goal of Therapy:  Heparin level 0.3-0.7 units/ml Monitor platelets by anticoagulation protocol: Yes   Plan:  Will discontinue DVT prophylaxis Give 4000 units bolus x 1 Start heparin infusion at 800 units/hr Check anti-Xa level in 8 hours and daily while on heparin Continue to monitor H&H and  platelets  Merryl Hacker, PharmD Clinical Pharmacist 08/31/2023,4:32 PM

## 2023-08-31 NOTE — Assessment & Plan Note (Signed)
Senna docusate 1 tablet nightly ordered

## 2023-08-31 NOTE — Assessment & Plan Note (Addendum)
Patient is postop day 1, management per orthopedic team AM team to reconsult PT, OT when medically appropriate

## 2023-08-31 NOTE — Assessment & Plan Note (Signed)
Multifactorial in setting of recent surgical procedure requiring anesthesia with idiopathic pulmonary fibrosis, OSA nonutilization of PAP therapy, heart failure preserved ejection fraction, chronic anemia in setting of chronic disease, iron deficiency anemia Treat per above

## 2023-08-31 NOTE — Progress Notes (Signed)
Subjective: 1 Day Post-Op Procedure(s) (LRB): Right reverse shoulder arthroplasty, biceps tenodesis (Right) Patient reports pain as mild.   Patient is well, and has had no acute complaints or problems Plan is to go Home after hospital stay. Negative for chest pain and shortness of breath Fever: no Gastrointestinal:Negative for nausea and vomiting Reports she is urinating well this AM.  No BM yet.  Objective: Vital signs in last 24 hours: Temp:  [97.3 F (36.3 C)-98.6 F (37 C)] 98.6 F (37 C) (09/18 0730) Pulse Rate:  [78-101] 86 (09/18 0730) Resp:  [16-23] 16 (09/18 0730) BP: (118-153)/(64-80) 132/69 (09/18 0730) SpO2:  [93 %-100 %] 94 % (09/18 0730) Weight:  [74.8 kg] 74.8 kg (09/17 0808)  Intake/Output from previous day:  Intake/Output Summary (Last 24 hours) at 08/31/2023 0731 Last data filed at 08/31/2023 0700 Gross per 24 hour  Intake 2308.75 ml  Output 290 ml  Net 2018.75 ml    Intake/Output this shift: No intake/output data recorded.  Labs: Recent Labs    08/31/23 0536  HGB 8.0*   Recent Labs    08/31/23 0536  WBC 12.2*  RBC 2.68*  HCT 25.6*  PLT 216   Recent Labs    08/31/23 0536  NA 140  K 4.4  CL 106  CO2 25  BUN 36*  CREATININE 1.86*  GLUCOSE 124*  CALCIUM 8.2*   No results for input(s): "LABPT", "INR" in the last 72 hours.   EXAM General - Patient is Alert, Appropriate, and Oriented Extremity - Sling intact to the right arm. Decreased sensation to the right arm from recent interscalene block. Intact to touch over her fingers.  Able to flex and extend fingers without pain. Honeycomb dressing with mild bloody drainage. Hemovac with minimal bloody drainage, removed without issue this morning.  4x4 with tegaderm applied over drain site. Motor Function - intact, moving foot and toes well on exam.  Abdomen soft with intact bowel sounds this AM.  Past Medical History:  Diagnosis Date   Acute hypoxemic respiratory failure (HCC)     Anemia in stage 4 chronic kidney disease (HCC)    Anginal pain (HCC)    Anxiety    Aortic atherosclerosis (HCC)    CHF (congestive heart failure) (HCC)    a.) TTE 09/30/2021: EF 55-60%, no RWMAs, mild MR, G1DD   Chronic pain    Chronic radicular pain of lower back    CKD (chronic kidney disease), stage IV (HCC)    Complication of anesthesia    a.) delayed emergence following ureteroscopy 07/2023   Coronary artery calcification seen on CT scan    Costochondritis    DDD (degenerative disc disease), lumbar    Depression    Dyspnea    GERD (gastroesophageal reflux disease)    Hiatal hernia    Hip dysplasia    Hyperlipidemia    Hypertension    Insomnia    Iron deficiency    Iron deficiency anemia    Low back pain    Low vitamin B12 level    Lumbar facet joint pain    Migraines    Nose colonized with MRSA 10/03/2020   a.) preop PCR (+) 10/03/2020 prior to RIGHT TKA; b.) preop PCR (+) 08/25/2023 prior to RIGHT REVERSE SHOULDER ARTHROPLASTY; BICEPS TENODESIS   Numbness and tingling of right leg    OA (osteoarthritis)    Obesity    OSA (obstructive sleep apnea)    a.) not currently utilizing nocturnal PAP therapy; positional. PCCM feels as  if symptom can be mitigated with diet/exercise/weight loss unless develops significant symptoms.   Osteoporosis    a.) recieves denosumab injections   Pelvic fracture (HCC)    Pneumonia    Pulmonary fibrosis (HCC)    Raynaud's disease without gangrene    Restless leg syndrome    a.) on pramipexole + oral Fe supplementation   Rheumatoid arthritis (HCC)    a.) Tx'd with hydroxychloroquine   Right renal mass 05/28/2023   a.) CT renal 05/28/2023:  5.7 cm mass in the medial aspect of right kidney   Seasonal allergies    Thoracic compression fracture (HCC)     Assessment/Plan: 1 Day Post-Op Procedure(s) (LRB): Right reverse shoulder arthroplasty, biceps tenodesis (Right) Principal Problem:   Rotator cuff arthropathy  Estimated body mass  index is 29.23 kg/m as calculated from the following:   Height as of this encounter: 5\' 3"  (1.6 m).   Weight as of this encounter: 74.8 kg. Advance diet Up with therapy D/C IV fluids when tolerating po intake.  Labs and vitals reviewed this AM.  WBC 12.2 this morning.  Hg 8.0. Hemovac removed this AM, 4x4 with tegaderm applied. Up with therapy today.  She is urinating well without pain. Denies any abdominal pain. Plan for discharge home today pending progress with PT.  DVT Prophylaxis - Aspirin and TED hose Non-weightbearing to the right arm.  Valeria Batman, PA-C [facility] 08/31/2023, 7:31 AM

## 2023-08-31 NOTE — Assessment & Plan Note (Signed)
Home spironolactone 25 mg daily every morning, enalapril 10 mg daily, hydrochlorothiazide 25 mg daily

## 2023-08-31 NOTE — Assessment & Plan Note (Signed)
Home mirtazapine 30 mg nightly, venlafaxine 225 mg daily with breakfast

## 2023-08-31 NOTE — Assessment & Plan Note (Signed)
Continue home ferrous sulfate 325 mg p.o. daily with breakfast

## 2023-08-31 NOTE — Assessment & Plan Note (Signed)
Hydroxychloroquine 200 mg p.o. twice daily

## 2023-08-31 NOTE — Assessment & Plan Note (Addendum)
Continue home pramipexole dosing as ordered by orthopedic team Home ferrous sulfate resumed

## 2023-08-31 NOTE — Assessment & Plan Note (Signed)
At baseline

## 2023-08-31 NOTE — Progress Notes (Signed)
Pt was ordered on CPAP at night. Pt stated "has been diagnosed with sleep apnea but has never worn a cpap at home and doesn't want to start now".

## 2023-08-31 NOTE — Hospital Course (Addendum)
[name] is a 72 year old female with history of idiopathic pulmonary fibrosis, history of right renal mass favoring renal cell carcinoma, CKD stage IIIb/IV, chronic constipation, anxiety, heart failure preserved ejection fraction, mixed iron deficiency anemia and anemia presumed secondary to stage IV CKD, history of hip dysplasia, hyperlipidemia, hypertension, insomnia, osteoarthritis, obesity, obstructive sleep apnea with history of nonutilization of CPAP therapy, osteoporosis, Raynaud's disease without gangrene, restless leg syndrome, rheumatoid arthritis, coronary artery disease, history of thoracic compression fracture 09/17: Patient was a direct admission under orthopedic service for right shoulder rotator cuff injury requiring RT reverse shoulder arthroplasty and right biceps tenodesis. 09/18, POD1: Per orthopedic primary service, patient was participating with physical therapy and was walking up stairs when her oxygen saturation desatted to the mid 80s.  At baseline patient does not require O2 supplementation. Hospitalist consulted - VSS, SpO2 of 99% on 2 L nasal cannula. CXR read as patchy by bilateral reticulonodular and airspace opacity, differential includes pulmonary edema versus aspiration, less likely infection. WBC elevation at 12.2, Hgb 8.0, creatinine 1.86, eGFR of 29. Pt reports that whenever she feels short of breath and unable to catch her breath, she then has feelings of chest pain, also c/o LE edema and soreness worse than baseline. Ruled out PE/DVT, Echo pending, troponin elevated started on heparin. Echo pending read.  09/19: Echo EF 55-60%, G1DD. Troponin still up again some to 171, continue to trend . Pt reports breathing well today, will trial wean off O2 as able 09/20: improved w/ lasix, troponins still flat, suspect no NSTEMI but rather likely demand ischemia,  reviewed w/ Dr Kirke Corin (cardiology) agrees for d/c heparin and ok for discharge hope appreciate his help w/  arranging expedited office f/u. Pt eager for discharge home    Consultants:  orthopedics  Procedures: 08/30/23: Right reverse total shoulder arthroplasty, right biceps tenodesis      ASSESSMENT & PLAN:   Principal Problem:   Acute hypoxemic respiratory failure (HCC) Active Problems:   Iron deficiency anemia   Chronic radicular pain of lower back   Depression with anxiety   Hip dysplasia   Hyperlipidemia   Hypertension   Raynaud's disease without gangrene   Restless legs syndrome (RLS)   Seropositive rheumatoid arthritis (HCC)   Dyspnea   Long-term use of immunosuppressant medication   Anemia in stage 4 chronic kidney disease (HCC)   Rotator cuff arthropathy   Renal mass, right   Leukocytosis   OSA (obstructive sleep apnea)   Elevated troponin   Leg pain, bilateral   At risk for constipation  Acute hypoxemic respiratory failure  and dyspnea on exertion  Unclear etiology but suspect exacerbation HFpEF Likely multifactorial in setting of recent surgical procedure requiring anesthesia complicated by chronic illnesses (idiopathic pulmonary fibrosis, OSA nonutilization of CPAP therapy, Hx HFpEF, chronic anemia) and new diagnoses NSTEMI vs demand ischemia, possible pulmonary edema, possible aspiration pneumonia Reasonably ruled out PE w/ negative VQ Reasonably ruled out lower respiratory infection w/ negative procalcitonin however (+)L shift w/ leukocytosis does concern for infection of some kind Slight elevation BNP - echo no concerns for overt CHF but does have G1DD will trial dose lasix, strict I&O Slight elevation of high sensitive troponin - trending flat, ok to d/c heparin no need got AC   Ceftriaxone to cover possible pneumonia but this seems ulikely will d/c abx   Exacerbation HFpEF, Grade 1 diastolic dysfunction  Lasix IV x1 resulted in improvement  Increase lasix PRN instuctions at discharge Follow up w/ cardiology arranged -  see AVS, appreciate confirmation from  Dr Kirke Corin   Elevated high sensitive troponin with positive delta, ruled our NSTEMI ruled in demand ischemia Heparin d/c   D-dimer elevated bilateral calf tenderness right greater than the left Ultrasound of the lower extremity to assess for DVT --> no evidence of DVT VQ scan (CTA not ordered due to patient with CKD stage IV and possible right renal cell carcinoma) --> No evidence of pulmonary emboli.   Leukocytosis w/ L shift - suspect reactive NO sustained tachycardia/tachypnea to meet criteria for SIRS/Sepsis  Procalcitonin is negative UA no concerns for UTI Trend CBC Ceftriaxone to cover possible pneumonia    At risk for constipation Senna docusate 1 tablet nightly ordered   OSA (obstructive sleep apnea) History of nonutilization of PAP Given the patient SpO2 desaturated with physical therapy, we will initiate CPAP nightly trial tonight to optimize patient recovery   Renal mass, right Continue outpatient follow-up with urology and planned MRI of the abdomen without contrast as planned   Rotator cuff arthropathy Patient is postop day 2 management per orthopedic team reconsult PT, OT when medically appropriate   Anemia in stage 4 chronic kidney disease (HCC) At baseline   Seropositive rheumatoid arthritis (HCC) Hydroxychloroquine 200 mg p.o. twice daily   Restless legs syndrome (RLS) Continue home pramipexole dosing as ordered by orthopedic team Home ferrous sulfate resumed   Hypertension Home spironolactone 25 mg daily every morning, enalapril 10 mg daily, hydrochlorothiazide 25 mg daily   Hyperlipidemia Rosuvastatin 5 mg nightly   Depression with anxiety Home mirtazapine 30 mg nightly, venlafaxine 225 mg daily with breakfast   Iron deficiency anemia Continue home ferrous sulfate 325 mg p.o. daily with breakfast   Avoid muscle relaxer given SpO2 desaturation, OSA with non-PAP utilization, and idiopathic pulmonary fibrosis.    No concerns based on BMI: Body  mass index is 29.23 kg/m.   DVT prophylaxis: on heparin IV fluids: no continuous IV fluids  Nutrition: cardiac diet Central lines / invasive devices: none  Code Status: FULL CODE ACP documentation reviewed: 09/01/23 and none on file in [facility]  TOC needs: TBD pending PT/OT eval may need HH  Barriers to dispo / significant pending items: O2 requirement, workup

## 2023-08-31 NOTE — Assessment & Plan Note (Signed)
Continue outpatient follow-up with urology and planned MRI of the abdomen without contrast as planned

## 2023-08-31 NOTE — Plan of Care (Signed)
  Problem: Education: Goal: Understanding of activity limitations/precautions following surgery will improve Outcome: Progressing   Problem: Activity: Goal: Ability to tolerate increased activity will improve Outcome: Progressing   Problem: Pain Management: Goal: Pain level will decrease with appropriate interventions Outcome: Progressing   Problem: Activity: Goal: Ability to tolerate increased activity will improve Outcome: Progressing

## 2023-08-31 NOTE — Assessment & Plan Note (Addendum)
History of nonutilization of PAP Given the patient SpO2 desaturated with physical therapy, we will initiate CPAP nightly trial tonight to optimize patient recovery

## 2023-08-31 NOTE — Progress Notes (Signed)
Will plan to hold off on discharge. Patient with increased dyspnea on exertion and decreased O2 saturation requirement.   CXR showing possible pulmonary edema vs aspiration pneumonia, more likely edema. R hemidiaphragm elevation likely due to interscalene nerve block and should self-resolve as block wears off.   Troponins mildly elevated as well.   Discussed patient with hospitalist team who will plan to take over as primary team for patient for further management.   Can progress with PT/OT when more medically appropriate. Patient should have outpatient PT for R RSA set up already for next week.

## 2023-08-31 NOTE — Assessment & Plan Note (Signed)
With bilateral calf tenderness Right greater than the left In setting of right renal mass suspicious for renal cell carcinoma and recent orthopedic procedure, and acute hypoxic respiratory failure, DVT cannot be excluded at this time Bilateral lower extremity DVT ordered

## 2023-08-31 NOTE — Progress Notes (Signed)
[facility] Progress Note  # Elevated high sensitive troponin with positive delta # D-dimer elevated # Acute hypoxic respiratory failure, with dyspnea on exertion and bilateral calf tenderness right greater than the left - Ultrasound of the lower extremity to assess for DVT ordered - VQ scan ordered at this time, CTA not ordered due to patient with CKD stage IV and possible right renal cell carcinoma - Heparin per pharmacy - Telemetry cardiac, inpatient bed  # Leukocytosis-suspect reactive, repeat CBC in a.m. - Procalcitonin is negative - However a UA has been ordered and pending collection at the time of this dictation  Dr. Sedalia Muta

## 2023-08-31 NOTE — Progress Notes (Signed)
Pt transported to room 246. Bedside report given to [place], [place]

## 2023-08-31 NOTE — Assessment & Plan Note (Signed)
Infectious etiology cannot be excluded at this time Workup in progress Check CBC in the a.m.

## 2023-08-31 NOTE — Progress Notes (Signed)
1157- this RN heard pt and sounded like she was vomiting.  When went to check on pt she said the [place] had got stuck and she felt like she choked on it or couldn't get it up.  Was mildly labored in bed. Pulse ox placed and in bed sats 89 % RA initially.  Pt also started c/o central chest pain.  EKG ordered per adult emergency protocol and dr patel called with no answer.  He returned call and stat portable CXR also ordered as well at STAT hospitalist consult per dr patel.  Called and informed about consult and dr patel phone number given for hospitalist to call him for the provider to provider contact.  Primary RN ana updated about pt and orders.

## 2023-09-01 DIAGNOSIS — J9601 Acute respiratory failure with hypoxia: Secondary | ICD-10-CM | POA: Diagnosis not present

## 2023-09-01 LAB — BASIC METABOLIC PANEL
Anion gap: 11 (ref 5–15)
BUN: 40 mg/dL — ABNORMAL HIGH (ref 8–23)
CO2: 27 mmol/L (ref 22–32)
Calcium: 8.3 mg/dL — ABNORMAL LOW (ref 8.9–10.3)
Chloride: 101 mmol/L (ref 98–111)
Creatinine, Ser: 1.94 mg/dL — ABNORMAL HIGH (ref 0.44–1.00)
GFR, Estimated: 27 mL/min — ABNORMAL LOW (ref >60)
Glucose, Bld: 132 mg/dL — ABNORMAL HIGH (ref 70–99)
Potassium: 3.7 mmol/L (ref 3.5–5.1)
Sodium: 139 mmol/L (ref 135–145)

## 2023-09-01 LAB — CBC
HCT: 28 % — ABNORMAL LOW (ref 36.0–46.0)
Hemoglobin: 9.1 g/dL — ABNORMAL LOW (ref 12.0–15.0)
MCH: 30 pg (ref 26.0–34.0)
MCHC: 32.5 g/dL (ref 30.0–36.0)
MCV: 92.4 fL (ref 80.0–100.0)
Platelets: 233 10*3/uL (ref 150–400)
RBC: 3.03 MIL/uL — ABNORMAL LOW (ref 3.87–5.11)
RDW: 15.2 % (ref 11.5–15.5)
WBC: 17.7 10*3/uL — ABNORMAL HIGH (ref 4.0–10.5)
nRBC: 0 % (ref 0.0–0.2)

## 2023-09-01 LAB — LIPID PANEL
Cholesterol: 112 mg/dL (ref 0–200)
HDL: 58 mg/dL (ref >40)
LDL Cholesterol: 36 mg/dL (ref 0–99)
Total CHOL/HDL Ratio: 1.9 RATIO
Triglycerides: 89 mg/dL (ref <150)
VLDL: 18 mg/dL (ref 0–40)

## 2023-09-01 LAB — ECHOCARDIOGRAM COMPLETE
Area-P 1/2: 4.68 cm2
Height: 63 in
S' Lateral: 2.7 cm
Weight: 2640 oz

## 2023-09-01 LAB — HEPARIN LEVEL (UNFRACTIONATED)
Heparin Unfractionated: 0.14 IU/mL — ABNORMAL LOW (ref 0.30–0.70)
Heparin Unfractionated: 0.15 IU/mL — ABNORMAL LOW (ref 0.30–0.70)
Heparin Unfractionated: 0.29 IU/mL — ABNORMAL LOW (ref 0.30–0.70)

## 2023-09-01 LAB — TROPONIN I (HIGH SENSITIVITY): Troponin I (High Sensitivity): 171 ng/L (ref <18)

## 2023-09-01 MED ORDER — HEPARIN BOLUS VIA INFUSION
2000.0000 [IU] | Freq: Once | INTRAVENOUS | Status: AC
  Administered 2023-09-01: 2000 [IU] via INTRAVENOUS
  Filled 2023-09-01: qty 2000

## 2023-09-01 MED ORDER — HEPARIN BOLUS VIA INFUSION
4000.0000 [IU] | Freq: Once | INTRAVENOUS | Status: AC
  Administered 2023-09-01: 4000 [IU] via INTRAVENOUS
  Filled 2023-09-01: qty 4000

## 2023-09-01 MED ORDER — ADULT MULTIVITAMIN W/MINERALS CH
1.0000 | ORAL_TABLET | Freq: Every day | ORAL | Status: DC
  Administered 2023-09-01 – 2023-09-02 (×2): 1 via ORAL
  Filled 2023-09-01 (×2): qty 1

## 2023-09-01 MED ORDER — FUROSEMIDE 10 MG/ML IJ SOLN
20.0000 mg | Freq: Once | INTRAMUSCULAR | Status: AC
  Administered 2023-09-01: 20 mg via INTRAVENOUS
  Filled 2023-09-01: qty 2

## 2023-09-01 MED ORDER — ENSURE ENLIVE PO LIQD
237.0000 mL | Freq: Three times a day (TID) | ORAL | Status: DC
  Administered 2023-09-01 – 2023-09-02 (×4): 237 mL via ORAL

## 2023-09-01 MED ORDER — HEPARIN BOLUS VIA INFUSION
1000.0000 [IU] | Freq: Once | INTRAVENOUS | Status: AC
  Administered 2023-09-01: 1000 [IU] via INTRAVENOUS
  Filled 2023-09-01: qty 1000

## 2023-09-01 NOTE — Progress Notes (Signed)
PROGRESS NOTE    Tonya Cummings   IEP:[id number] DOB: [birth year]  DOA: 08/30/2023 Date of Service: 09/01/23 PCP: Myrene Buddy, NP     Brief Narrative / Hospital Course:  [name] is a 72 year old female with history of idiopathic pulmonary fibrosis, history of right renal mass favoring renal cell carcinoma, CKD stage IIIb/IV, chronic constipation, anxiety, heart failure preserved ejection fraction, mixed iron deficiency anemia and anemia presumed secondary to stage IV CKD, history of hip dysplasia, hyperlipidemia, hypertension, insomnia, osteoarthritis, obesity, obstructive sleep apnea with history of nonutilization of CPAP therapy, osteoporosis, Raynaud's disease without gangrene, restless leg syndrome, rheumatoid arthritis, coronary artery disease, history of thoracic compression fracture 09/17: Patient was a direct admission under orthopedic service for right shoulder rotator cuff injury requiring RT reverse shoulder arthroplasty and right biceps tenodesis. 09/18, POD1: Per orthopedic primary service, patient was participating with physical therapy and was walking up stairs when her oxygen saturation desatted to the mid 80s.  At baseline patient does not require O2 supplementation. Hospitalist consulted - VSS, SpO2 of 99% on 2 L nasal cannula. CXR read as patchy by bilateral reticulonodular and airspace opacity, differential includes pulmonary edema versus aspiration, less likely infection. WBC elevation at 12.2, Hgb 8.0, creatinine 1.86, eGFR of 29. Pt reports that whenever she feels short of breath and unable to catch her breath, she then has feelings of chest pain, also c/o LE edema and soreness worse than baseline. Ruled out PE/DVT, Echo pending, troponin elevated started on heparin. Echo pending read.  09/19: Echo EF 55-60%, G1DD. Troponin still up again some to 171, continue to trend . Pt reports breathing well today, will trial wean off O2 as able, PT/OT ordered       Consultants:  orthopedics  Procedures: 08/30/23: Right reverse total shoulder arthroplasty, right biceps tenodesis      ASSESSMENT & PLAN:   Principal Problem:   Acute hypoxemic respiratory failure (HCC) Active Problems:   Iron deficiency anemia   Chronic radicular pain of lower back   Depression with anxiety   Hip dysplasia   Hyperlipidemia   Hypertension   Raynaud's disease without gangrene   Restless legs syndrome (RLS)   Seropositive rheumatoid arthritis (HCC)   Dyspnea   Long-term use of immunosuppressant medication   Anemia in stage 4 chronic kidney disease (HCC)   Rotator cuff arthropathy   Renal mass, right   Leukocytosis   OSA (obstructive sleep apnea)   Elevated troponin   Leg pain, bilateral   At risk for constipation  Acute hypoxemic respiratory failure  and dyspnea on exertion  Unclear etiology Likely multifactorial in setting of recent surgical procedure requiring anesthesia complicated by chronic illnesses (idiopathic pulmonary fibrosis, OSA nonutilization of CPAP therapy, Hx HFpEF, chronic anemia) and new diagnoses NSTEMI vs demand ischemia, possible pulmonary edema, possible aspiration pneumonia Reasonably ruled out PE w/ negative VQ Reasonably ruled out lower respiratory infection w/ negative procalcitonin however (+)L shift w/ leukocytosis does concern for infection of some kind Slight elevation BNP - echo no concerns for overt CHF but does have G1DD will trial dose lasix, strict I&O Slight elevation of high sensitive troponin - trending  Ceftriaxone to cover possible pneumonia  Elevated high sensitive troponin with positive delta, question NSTEMI vs demand ischemia Heparin per pharmacy Trend troponin  Telemetry cardiac, inpatient bed  D-dimer elevated bilateral calf tenderness right greater than the left Ultrasound of the lower extremity to assess for DVT --> no evidence of DVT VQ scan (CTA not  ordered due to patient with CKD stage IV  and possible right renal cell carcinoma) --> No evidence of pulmonary emboli.   BNP elevated Dyspnea on exertion Echocardiogram    Leukocytosis w/ L shift - suspect reactive NO sustained tachycardia/tachypnea to meet criteria for SIRS/Sepsis  Procalcitonin is negative UA no concerns for UTI Trend CBC Ceftriaxone to cover possible pneumonia    At risk for constipation Senna docusate 1 tablet nightly ordered   OSA (obstructive sleep apnea) History of nonutilization of PAP Given the patient SpO2 desaturated with physical therapy, we will initiate CPAP nightly trial tonight to optimize patient recovery   Renal mass, right Continue outpatient follow-up with urology and planned MRI of the abdomen without contrast as planned   Rotator cuff arthropathy Patient is postop day 2 management per orthopedic team reconsult PT, OT when medically appropriate   Anemia in stage 4 chronic kidney disease (HCC) At baseline   Seropositive rheumatoid arthritis (HCC) Hydroxychloroquine 200 mg p.o. twice daily   Restless legs syndrome (RLS) Continue home pramipexole dosing as ordered by orthopedic team Home ferrous sulfate resumed   Hypertension Home spironolactone 25 mg daily every morning, enalapril 10 mg daily, hydrochlorothiazide 25 mg daily   Hyperlipidemia Rosuvastatin 5 mg nightly   Depression with anxiety Home mirtazapine 30 mg nightly, venlafaxine 225 mg daily with breakfast   Iron deficiency anemia Continue home ferrous sulfate 325 mg p.o. daily with breakfast   Avoid muscle relaxer given SpO2 desaturation, OSA with non-PAP utilization, and idiopathic pulmonary fibrosis.    No concerns based on BMI: Body mass index is 29.23 kg/m.   DVT prophylaxis: on heparin IV fluids: no continuous IV fluids  Nutrition: cardiac diet Central lines / invasive devices: none  Code Status: FULL CODE ACP documentation reviewed: 09/01/23 and none on file in [facility]  TOC needs: TBD pending  PT/OT eval may need HH  Barriers to dispo / significant pending items: O2 requirement, workup              Subjective / Brief ROS:  Patient reports feeling better today, no chest pain, hasn't been ambulating Denies CP/SOB at rest   Pain controlled.  Denies new weakness.  Tolerating diet.  Reports no concerns w/ urination/defecation.   Family Communication: none at this time     Objective Findings:  Vitals:   08/31/23 2310 08/31/23 2344 09/01/23 0346 09/01/23 0736  BP: (!) 157/77  (!) 146/69 130/67  Pulse: (!) 102  98 93  Resp: (!) 22 (!) 22 18 18   Temp: 99 F (37.2 C)  98.6 F (37 C) 98.2 F (36.8 C)  TempSrc: Oral     SpO2: 99%  97% 100%  Weight:      Height:        Intake/Output Summary (Last 24 hours) at 09/01/2023 1429 Last data filed at 09/01/2023 1425 Gross per 24 hour  Intake 816.65 ml  Output 4700 ml  Net -3883.35 ml   Filed Weights   08/30/23 0808  Weight: 74.8 kg    Examination:  Physical Exam Constitutional:      General: She is not in acute distress. Cardiovascular:     Rate and Rhythm: Normal rate and regular rhythm.  Pulmonary:     Effort: Pulmonary effort is normal.     Breath sounds: Normal breath sounds.  Skin:    General: Skin is warm and dry.  Neurological:     General: No focal deficit present.     Mental Status: She is  alert and oriented to person, place, and time.  Psychiatric:        Mood and Affect: Mood normal.        Behavior: Behavior normal.          Scheduled Medications:   aspirin EC  325 mg Oral Daily   calcitRIOL  0.25 mcg Oral q morning   cholecalciferol  2,000 Units Oral q morning   cyanocobalamin  500 mcg Oral q morning   docusate sodium  100 mg Oral BID   enalapril  10 mg Oral Daily   And   hydrochlorothiazide  25 mg Oral Daily   feeding supplement  237 mL Oral TID BM   ferrous sulfate  325 mg Oral Q breakfast   furosemide  20 mg Intravenous Once   hydroxychloroquine  200 mg Oral BID    mirtazapine  30 mg Oral QHS   multivitamin with minerals  1 tablet Oral Daily   pantoprazole  80 mg Oral Daily   pramipexole  0.125 mg Oral Daily   And   pramipexole  0.25 mg Oral Daily   rosuvastatin  5 mg Oral QHS   senna-docusate  1 tablet Oral QHS   spironolactone  25 mg Oral q morning   venlafaxine XR  225 mg Oral Q breakfast    Continuous Infusions:  cefTRIAXone (ROCEPHIN)  IV 200 mL/hr at 09/01/23 0500   heparin 1,200 Units/hr (09/01/23 1308)    PRN Medications:  acetaminophen **OR** acetaminophen, alum & mag hydroxide-simeth, bisacodyl, menthol-cetylpyridinium **OR** phenol, metoCLOPramide **OR** metoCLOPramide (REGLAN) injection, ondansetron (ZOFRAN) IV, oxyCODONE  Antimicrobials from admission:  Anti-infectives (From admission, onward)    Start     Dose/Rate Route Frequency Ordered Stop   08/31/23 2000  cefTRIAXone (ROCEPHIN) 1 g in sodium chloride 0.9 % 100 mL IVPB        1 g 200 mL/hr over 30 Minutes Intravenous Every 24 hours 08/31/23 1820 09/05/23 1959   08/30/23 2200  hydroxychloroquine (PLAQUENIL) tablet 200 mg        200 mg Oral 2 times daily 08/30/23 1257     08/30/23 2200  ceFAZolin (ANCEF) IVPB 2g/100 mL premix        2 g 200 mL/hr over 30 Minutes Intravenous Every 12 hours 08/30/23 1336 08/31/23 1225   08/30/23 1600  ceFAZolin (ANCEF) IVPB 2g/100 mL premix  Status:  Discontinued        2 g 200 mL/hr over 30 Minutes Intravenous Every 6 hours 08/30/23 1257 08/30/23 1336   08/30/23 0945  vancomycin (VANCOCIN) powder  Status:  Discontinued          As needed 08/30/23 0945 08/30/23 1139   08/30/23 0745  vancomycin (VANCOCIN) IVPB 1000 mg/200 mL premix        1,000 mg 200 mL/hr over 60 Minutes Intravenous  Once 08/30/23 0743 08/30/23 0947   08/30/23 0745  ceFAZolin (ANCEF) IVPB 2g/100 mL premix        2 g 200 mL/hr over 30 Minutes Intravenous On call to O.R. 08/30/23 3557 08/30/23 0949           Data Reviewed:  I have personally reviewed the  following...  CBC: Recent Labs  Lab 08/31/23 0536 08/31/23 1305 09/01/23 0118  WBC 12.2* 15.5* 17.7*  NEUTROABS  --  12.3*  --   HGB 8.0* 8.8* 9.1*  HCT 25.6* 28.5* 28.0*  MCV 95.5 95.6 92.4  PLT 216 245 233   Basic Metabolic Panel: Recent Labs  Lab 08/31/23  7253 08/31/23 1313 09/01/23 1112  NA 140 138 139  K 4.4 3.8 3.7  CL 106 106 101  CO2 25 23 27   GLUCOSE 124* 110* 132*  BUN 36* 39* 40*  CREATININE 1.86* 1.98* 1.94*  CALCIUM 8.2* 8.3* 8.3*   GFR: Estimated Creatinine Clearance: 26.2 mL/min (A) (by C-G formula based on SCr of 1.94 mg/dL (H)). Liver Function Tests: Recent Labs  Lab 08/31/23 1313  AST 69*  ALT 38  ALKPHOS 84  BILITOT 0.6  PROT 7.2  ALBUMIN 3.2*   No results for input(s): "LIPASE", "AMYLASE" in the last 168 hours. No results for input(s): "AMMONIA" in the last 168 hours. Coagulation Profile: Recent Labs  Lab 08/31/23 1535  INR 1.1   Cardiac Enzymes: No results for input(s): "CKTOTAL", "CKMB", "CKMBINDEX", "TROPONINI" in the last 168 hours. BNP (last 3 results) No results for input(s): "PROBNP" in the last 8760 hours. HbA1C: No results for input(s): "HGBA1C" in the last 72 hours. CBG: No results for input(s): "GLUCAP" in the last 168 hours. Lipid Profile: Recent Labs    09/01/23 1112  CHOL 112  HDL 58  LDLCALC 36  TRIG 89  CHOLHDL 1.9   Thyroid Function Tests: No results for input(s): "TSH", "T4TOTAL", "FREET4", "T3FREE", "THYROIDAB" in the last 72 hours. Anemia Panel: No results for input(s): "VITAMINB12", "FOLATE", "FERRITIN", "TIBC", "IRON", "RETICCTPCT" in the last 72 hours. Most Recent Urinalysis On File:     Component Value Date/Time   COLORURINE STRAW (A) 08/31/2023 1304   APPEARANCEUR CLEAR (A) 08/31/2023 1304   LABSPEC 1.008 08/31/2023 1304   PHURINE 5.0 08/31/2023 1304   GLUCOSEU NEGATIVE 08/31/2023 1304   HGBUR NEGATIVE 08/31/2023 1304   BILIRUBINUR NEGATIVE 08/31/2023 1304   KETONESUR NEGATIVE 08/31/2023  1304   PROTEINUR NEGATIVE 08/31/2023 1304   NITRITE NEGATIVE 08/31/2023 1304   LEUKOCYTESUR SMALL (A) 08/31/2023 1304   Sepsis Labs: @LABRCNTIP (procalcitonin:4,lacticidven:4) Microbiology: Recent Results (from the past 240 hour(s))  Surgical pcr screen     Status: Abnormal   Collection Time: 08/25/23  1:41 PM   Specimen: Nasal Mucosa; Nasal Swab  Result Value Ref Range Status   MRSA, PCR POSITIVE (A) NEGATIVE Final    Comment: RESULT CALLED TO, READ BACK BY AND VERIFIED WITH: BRYAN GRAY @1904  ON 08/25/23 SKL    Staphylococcus aureus POSITIVE (A) NEGATIVE Final    Comment: (NOTE) The Xpert SA Assay (FDA approved for NASAL specimens in patients 36 years of age and older), is one component of a comprehensive surveillance program. It is not intended to diagnose infection nor to guide or monitor treatment. Performed at [facility], [address], [place], [place] [address]       Radiology Studies last 3 days: ECHOCARDIOGRAM COMPLETE  Result Date: 09/01/2023    ECHOCARDIOGRAM REPORT   Patient Name:   Tonya Cummings Date of Exam: 08/31/2023 Medical Rec #:  [id number]         Height:       63.0 in Accession #:    [id number]        Weight:       165.0 lb Date of Birth:  10-Apr-1951        BSA:          1.782 m Patient Age:    71 years          BP:           118/68 mmHg Patient Gender: F  HR:           98 bpm. Exam Location:  [facility] Procedure: 2D Echo, Cardiac Doppler and Color Doppler Indications:     R06.00 Dyspnea  History:         Patient has prior history of Echocardiogram examinations, most                  recent 09/30/2021. CHF, Signs/Symptoms:Dyspnea; Risk                  Factors:Hypertension and Dyslipidemia. Obstructive sleep apnea.                  Chronic Kidney disease.  Sonographer:     Daphine Deutscher RDCS Referring Phys:  [id number] AMY N [facility] Diagnosing Phys: Julien Nordmann MD IMPRESSIONS  1. Left ventricular ejection fraction, by estimation, is 55  to 60%. The left ventricle has normal function. The left ventricle demonstrates regional wall motion abnormalities (see scoring diagram/findings for description). Left ventricular diastolic parameters are consistent with Grade I diastolic dysfunction (impaired relaxation).  2. Right ventricular systolic function is normal. The right ventricular size is normal. Tricuspid regurgitation signal is inadequate for assessing PA pressure.  3. Left atrial size was mildly dilated.  4. The mitral valve is normal in structure. Mild mitral valve regurgitation. No evidence of mitral stenosis.  5. The aortic valve has an indeterminant number of cusps. Aortic valve regurgitation is not visualized. No aortic stenosis is present.  6. The inferior vena cava is normal in size with greater than 50% respiratory variability, suggesting right atrial pressure of 3 mmHg. FINDINGS  Left Ventricle: Left ventricular ejection fraction, by estimation, is 55 to 60%. The left ventricle has normal function. The left ventricle demonstrates regional wall motion abnormalities. The left ventricular internal cavity size was normal in size. There is no left ventricular hypertrophy. Left ventricular diastolic parameters are consistent with Grade I diastolic dysfunction (impaired relaxation). Right Ventricle: The right ventricular size is normal. No increase in right ventricular wall thickness. Right ventricular systolic function is normal. Tricuspid regurgitation signal is inadequate for assessing PA pressure. Left Atrium: Left atrial size was mildly dilated. Right Atrium: Right atrial size was normal in size. Pericardium: There is no evidence of pericardial effusion. Mitral Valve: The mitral valve is normal in structure. Mild mitral valve regurgitation. No evidence of mitral valve stenosis. Tricuspid Valve: The tricuspid valve is normal in structure. Tricuspid valve regurgitation is not demonstrated. No evidence of tricuspid stenosis. Aortic Valve: The  aortic valve has an indeterminant number of cusps. Aortic valve regurgitation is not visualized. No aortic stenosis is present. Pulmonic Valve: The pulmonic valve was normal in structure. Pulmonic valve regurgitation is not visualized. No evidence of pulmonic stenosis. Aorta: The aortic root is normal in size and structure. Venous: The inferior vena cava is normal in size with greater than 50% respiratory variability, suggesting right atrial pressure of 3 mmHg. IAS/Shunts: No atrial level shunt detected by color flow Doppler.  LEFT VENTRICLE PLAX 2D LVIDd:         4.20 cm Diastology LVIDs:         2.70 cm LV e' medial:    6.20 cm/s LV PW:         1.30 cm LV E/e' medial:  16.6 LV IVS:        0.80 cm LV e' lateral:   9.03 cm/s  LV E/e' lateral: 11.4  RIGHT VENTRICLE RV Basal diam:  3.80 cm RV S prime:     19.13 cm/s TAPSE (M-mode): 3.8 cm LEFT ATRIUM             Index        RIGHT ATRIUM           Index LA diam:        4.80 cm 2.69 cm/m   RA Area:     11.00 cm LA Vol (A2C):   44.3 ml 24.86 ml/m  RA Volume:   26.70 ml  14.98 ml/m LA Vol (A4C):   72.9 ml 40.91 ml/m LA Biplane Vol: 58.1 ml 32.61 ml/m  AORTIC VALVE LVOT Vmax:   108.93 cm/s LVOT Vmean:  74.600 cm/s LVOT VTI:    0.188 m  AORTA Ao Root diam: 2.90 cm MITRAL VALVE MV Area (PHT): 4.68 cm     SHUNTS MV Decel Time: 162 msec     Systemic VTI: 0.19 m MV E velocity: 102.80 cm/s MV A velocity: 140.50 cm/s MV E/A ratio:  0.73 Julien Nordmann MD Electronically signed by Julien Nordmann MD Signature Date/Time: 09/01/2023/12:17:55 PM    Final    NM Pulmonary Perfusion  Result Date: 08/31/2023 CLINICAL DATA:  Positive D-dimer and known renal mass EXAM: NUCLEAR MEDICINE PERFUSION LUNG SCAN TECHNIQUE: Perfusion images were obtained in multiple projections after intravenous injection of radiopharmaceutical. Ventilation scans intentionally deferred if perfusion scan and chest x-ray adequate for interpretation during COVID 19 epidemic.  RADIOPHARMACEUTICALS:  4.17 mCi Tc-67m MAA IV COMPARISON:  09/29/2021 FINDINGS: Adequate uptake is noted throughout both lungs. No wedge-shaped defect to suggest pulmonary embolism is noted. The overall appearance is similar to that seen on the prior exam. IMPRESSION: No evidence of pulmonary emboli. Electronically Signed   By: Alcide Clever M.D.   On: 08/31/2023 20:01   US Venous Img Lower Bilateral (DVT)  Result Date: 08/31/2023 CLINICAL DATA:  Bilateral lower extremity pain and swelling EXAM: BILATERAL LOWER EXTREMITY VENOUS DOPPLER ULTRASOUND TECHNIQUE: Gray-scale sonography with graded compression, as well as color Doppler and duplex ultrasound were performed to evaluate the lower extremity deep venous systems from the level of the common femoral vein and including the common femoral, femoral, profunda femoral, popliteal and calf veins including the posterior tibial, peroneal and gastrocnemius veins when visible. The superficial great saphenous vein was also interrogated. Spectral Doppler was utilized to evaluate flow at rest and with distal augmentation maneuvers in the common femoral, femoral and popliteal veins. COMPARISON:  None Available. FINDINGS: RIGHT LOWER EXTREMITY Common Femoral Vein: No evidence of thrombus. Normal compressibility, respiratory phasicity and response to augmentation. Saphenofemoral Junction: No evidence of thrombus. Normal compressibility and flow on color Doppler imaging. Profunda Femoral Vein: No evidence of thrombus. Normal compressibility and flow on color Doppler imaging. Femoral Vein: No evidence of thrombus. Normal compressibility, respiratory phasicity and response to augmentation. Popliteal Vein: No evidence of thrombus. Normal compressibility, respiratory phasicity and response to augmentation. Calf Veins: No evidence of thrombus. Normal compressibility and flow on color Doppler imaging. Superficial Great Saphenous Vein: No evidence of thrombus. Normal compressibility.  Venous Reflux:  None. Other Findings:  None. LEFT LOWER EXTREMITY Common Femoral Vein: No evidence of thrombus. Normal compressibility, respiratory phasicity and response to augmentation. Saphenofemoral Junction: No evidence of thrombus. Normal compressibility and flow on color Doppler imaging. Profunda Femoral Vein: No evidence of thrombus. Normal compressibility and flow on color Doppler imaging. Femoral Vein: No evidence of thrombus. Normal compressibility, respiratory  phasicity and response to augmentation. Popliteal Vein: No evidence of thrombus. Normal compressibility, respiratory phasicity and response to augmentation. Calf Veins: No evidence of thrombus. Normal compressibility and flow on color Doppler imaging. Superficial Great Saphenous Vein: No evidence of thrombus. Normal compressibility. Venous Reflux:  None. Other Findings:  None. IMPRESSION: No evidence of deep venous thrombosis in either lower extremity. Electronically Signed   By: Alcide Clever M.D.   On: 08/31/2023 19:59   DG Chest Portable 1 View  Result Date: 08/31/2023 CLINICAL DATA:  hypoxia, chest pain EXAM: PORTABLE CHEST 1 VIEW COMPARISON:  June 15th 2024, October 12, 2021 FINDINGS: The cardiomediastinal silhouette is unchanged and enlarged in contour.Low lung volume radiograph. Elevation of the RIGHT hemidiaphragm, increased in comparison to June 2024. No pleural effusion. No pneumothorax. Patchy bilateral reticulonodular and airspace opacities. Status post RIGHT shoulder arthroplasty IMPRESSION: 1. Patchy bilateral reticulonodular and airspace opacities. Differential considerations include pulmonary edema versus aspiration, less likely infection. 2. Increased elevation of the RIGHT hemidiaphragm since June 2015, possibly due to technique. Consider PA and lateral chest radiograph when clinically appropriate. Electronically Signed   By: Meda Klinefelter M.D.   On: 08/31/2023 12:54   DG Shoulder Right Port  Result Date:  08/30/2023 CLINICAL DATA:  [id number] Status post shoulder replacement [id number] EXAM: RIGHT SHOULDER - 1 VIEW COMPARISON:  08/11/2023 FINDINGS: Interval postsurgical changes from reverse right shoulder arthroplasty. Arthroplasty components are in their expected alignment. No periprosthetic fracture identified. Expected postoperative changes within the overlying soft tissues. IMPRESSION: Interval postsurgical changes from reverse right shoulder arthroplasty. No evidence of immediate postoperative complication. Electronically Signed   By: Duanne Guess D.O.   On: 08/30/2023 16:03   [place] OR NERVE BLOCK-IMAGE ONLY [facility]  Result Date: 08/30/2023 There is no interpretation for this exam.  This order is for images obtained during a surgical procedure.  Please See "Surgeries" Tab for more information regarding the procedure.             LOS: 1 day    Time spent: 50 min     Sunnie Nielsen, DO [facility] 09/01/2023, 2:29 PM    Dictation software may have been used to generate the above note. Typos may occur and escape review in typed/dictated notes. Please contact Dr Lyn Hollingshead directly for clarity if needed.  Staff may message me via secure chat in Epic  but this may not receive an immediate response,  please page me for urgent matters!  If 7PM-7AM, please contact night coverage [email or web address]

## 2023-09-01 NOTE — TOC Initial Note (Signed)
Transition of Care [facility]) - Initial/Assessment Note    Patient Details  Name: Tonya Cummings MRN: [id number] Date of Birth: [birth year]  Transition of Care [facility]) CM/SW Contact:    Truddie Hidden, RN Phone Number: 09/01/2023, 4:03 PM  Clinical Narrative:                 Attempt to speak with patient regarding therapy's recommendation Patient sleeping.         Patient Goals and CMS Choice            Expected Discharge Plan and Services         Expected Discharge Date: 08/31/23                                    Prior Living Arrangements/Services                       Activities of Daily Living Home Assistive Devices/Equipment: Brace (specify type), Cane (specify quad or straight), Eyeglasses, Grab bars in shower, Walker (specify type), Wheelchair ADL Screening (condition at time of admission) Patient's cognitive ability adequate to safely complete daily activities?: Yes Is the patient deaf or have difficulty hearing?: No Does the patient have difficulty seeing, even when wearing glasses/contacts?: Yes Does the patient have difficulty concentrating, remembering, or making decisions?: Yes Patient able to express need for assistance with ADLs?: Yes Does the patient have difficulty dressing or bathing?: No Independently performs ADLs?: Yes (appropriate for developmental age) Does the patient have difficulty walking or climbing stairs?: Yes Weakness of Legs: Both Weakness of Arms/Hands: Both  Permission Sought/Granted                  Emotional Assessment              Admission diagnosis:  Rotator cuff arthropathy [M12.819] Acute hypoxemic respiratory failure (HCC) [J96.01] Patient Active Problem List   Diagnosis Date Noted   Renal mass, right 08/31/2023   Leukocytosis 08/31/2023   OSA (obstructive sleep apnea) 08/31/2023   Elevated troponin 08/31/2023   Leg pain, bilateral 08/31/2023   At risk for constipation 08/31/2023   Rotator  cuff arthropathy 08/30/2023   Anemia in stage 4 chronic kidney disease (HCC) 01/10/2023   Long-term use of immunosuppressant medication 03/09/2022   Dyspnea 09/29/2021   Acute CHF (congestive heart failure) (HCC) 09/29/2021   Acute hypoxemic respiratory failure (HCC) 09/29/2021   Total knee replacement status 10/13/2020   CKD (chronic kidney disease) stage 3, GFR 30-59 ml/min (HCC) 10/12/2020   Hyperlipidemia 10/12/2020   Hypertension 10/12/2020   Migraine headache 10/12/2020   Restless legs syndrome (RLS) 10/12/2020   Severe obesity (BMI 35.0-39.9) with comorbidity (HCC) 06/30/2020   Primary osteoarthritis of right knee 03/27/2020   Iron deficiency anemia 09/12/2019   Low vitamin B12 level 09/12/2019   Normocytic anemia 09/07/2019   Right medial knee pain 08/09/2018   Depression with anxiety 03/22/2018   Sleep disorder 03/22/2018   Influenza A 02/17/2018   Community acquired pneumonia 02/17/2018   Chronic radicular pain of lower back 02/06/2018   Numbness and tingling of right leg 02/06/2018   Hip dysplasia 02/23/2016   Osteoporosis, post-menopausal 02/23/2016   Raynaud's disease without gangrene 02/23/2016   Seropositive rheumatoid arthritis (HCC) 02/23/2016   Lumbar facet joint pain 09/18/2014   PCP:  Myrene Buddy, NP Pharmacy:   [facility] [phone] - [place], [place] -  [address] AT [facility] [address] [place] [address] [place] [address] [facility] [place] [address] Phone: [phone] Fax: [phone]     Social Determinants of Health (SDOH) Social History: SDOH Screenings   Food Insecurity: No Food Insecurity (08/30/2023)  Housing: Low Risk  (08/30/2023)  Transportation Needs: No Transportation Needs (08/30/2023)  Utilities: Not At Risk (08/30/2023)  Depression (PHQ2-9): Low Risk  (09/02/2022)  Tobacco Use: Low Risk  (08/31/2023)   SDOH Interventions:     Readmission Risk Interventions     No data to display

## 2023-09-01 NOTE — Progress Notes (Signed)
Subjective: 2 Days Post-Op Procedure(s) (LRB): Right reverse shoulder arthroplasty, biceps tenodesis (Right) Patient reports pain as mild.   Patient is well, and has had no acute complaints or problems.  She states that her overall shortness of breath and chest discomfort has improved from yesterday.  She states that she does not feel short of breath during this visit.  Of note patient is still on 2 L of oxygen. Plan is to go Home after hospital stay. Currently denies any CP, SOB Fever: no Gastrointestinal:Negative for nausea and vomiting Reports she is urinating well this AM.  No BM yet.  Objective: Vital signs in last 24 hours: Temp:  [97.8 F (36.6 C)-99 F (37.2 C)] 98.2 F (36.8 C) (09/19 0736) Pulse Rate:  [85-102] 93 (09/19 0736) Resp:  [16-22] 18 (09/19 0736) BP: (127-157)/(61-77) 130/67 (09/19 0736) SpO2:  [91 %-100 %] 100 % (09/19 0736)  Intake/Output from previous day:  Intake/Output Summary (Last 24 hours) at 09/01/2023 1226 Last data filed at 09/01/2023 1154 Gross per 24 hour  Intake 576.65 ml  Output 3600 ml  Net -3023.35 ml    Intake/Output this shift: Total I/O In: -  Out: 1400 [Urine:1400]  Labs: Recent Labs    08/31/23 0536 08/31/23 1305 09/01/23 0118  HGB 8.0* 8.8* 9.1*   Recent Labs    08/31/23 1305 09/01/23 0118  WBC 15.5* 17.7*  RBC 2.98* 3.03*  HCT 28.5* 28.0*  PLT 245 233   Recent Labs    08/31/23 0536 08/31/23 1313  NA 140 138  K 4.4 3.8  CL 106 106  CO2 25 23  BUN 36* 39*  CREATININE 1.86* 1.98*  GLUCOSE 124* 110*  CALCIUM 8.2* 8.3*   Recent Labs    08/31/23 1535  INR 1.1     EXAM General - Patient is Alert, Appropriate, and Oriented Extremity - Sling intact to the right arm. Patient is neurovascularly intact to all dermatomes of her right upper extremity now that the interscalene block has worn off.  Able to flex and extend fingers without pain. Honeycomb dressing with mild bloody drainage, intact.  No surrounding  redness or swelling appreciated Hemovac drain site intact without any drainage appreciated  Motor Function - intact, moving hands and fingers well without any issue.  Able to flex and extend fingers and wrist with normal ROM and strength.  Abdomen soft with intact bowel sounds this AM.  Past Medical History:  Diagnosis Date   Acute hypoxemic respiratory failure (HCC)    Anemia in stage 4 chronic kidney disease (HCC)    Anginal pain (HCC)    Anxiety    Aortic atherosclerosis (HCC)    CHF (congestive heart failure) (HCC)    a.) TTE 09/30/2021: EF 55-60%, no RWMAs, mild MR, G1DD   Chronic pain    Chronic radicular pain of lower back    CKD (chronic kidney disease), stage IV (HCC)    Complication of anesthesia    a.) delayed emergence following ureteroscopy 07/2023   Coronary artery calcification seen on CT scan    Costochondritis    DDD (degenerative disc disease), lumbar    Depression    Dyspnea    GERD (gastroesophageal reflux disease)    Hiatal hernia    Hip dysplasia    Hyperlipidemia    Hypertension    Insomnia    Iron deficiency    Iron deficiency anemia    Low back pain    Low vitamin B12 level    Lumbar facet joint  pain    Migraines    Nose colonized with MRSA 10/03/2020   a.) preop PCR (+) 10/03/2020 prior to RIGHT TKA; b.) preop PCR (+) 08/25/2023 prior to RIGHT REVERSE SHOULDER ARTHROPLASTY; BICEPS TENODESIS   Numbness and tingling of right leg    OA (osteoarthritis)    Obesity    OSA (obstructive sleep apnea)    a.) not currently utilizing nocturnal PAP therapy; positional. PCCM feels as if symptom can be mitigated with diet/exercise/weight loss unless develops significant symptoms.   Osteoporosis    a.) recieves denosumab injections   Pelvic fracture (HCC)    Pneumonia    Pulmonary fibrosis (HCC)    Raynaud's disease without gangrene    Restless leg syndrome    a.) on pramipexole + oral Fe supplementation   Rheumatoid arthritis (HCC)    a.) Tx'd with  hydroxychloroquine   Right renal mass 05/28/2023   a.) CT renal 05/28/2023:  5.7 cm mass in the medial aspect of right kidney   Seasonal allergies    Thoracic compression fracture (HCC)     Assessment/Plan: 2 Days Post-Op Procedure(s) (LRB): Right reverse shoulder arthroplasty, biceps tenodesis (Right) Principal Problem:   Acute hypoxemic respiratory failure (HCC) Active Problems:   Iron deficiency anemia   Chronic radicular pain of lower back   Depression with anxiety   Hip dysplasia   Hyperlipidemia   Hypertension   Raynaud's disease without gangrene   Restless legs syndrome (RLS)   Seropositive rheumatoid arthritis (HCC)   Dyspnea   Long-term use of immunosuppressant medication   Anemia in stage 4 chronic kidney disease (HCC)   Rotator cuff arthropathy   Renal mass, right   Leukocytosis   OSA (obstructive sleep apnea)   Elevated troponin   Leg pain, bilateral   At risk for constipation  Estimated body mass index is 29.23 kg/m as calculated from the following:   Height as of this encounter: 5\' 3"  (1.6 m).   Weight as of this encounter: 74.8 kg. Advance diet Up with therapy D/C IV fluids when tolerating po intake.  Patient had an episode of shortness of breath and chest discomfort yesterday on 08/31/2023 while up ambulating with PT.  Inpatient medicine has been working her up and evaluating her for this issue.  Troponins and D-dimers were elevated.  An ultrasound for the lower extremity and a VQ scan have been ordered.  Continue to follow along with inpatient medicine  Patient has been started on heparin per pharmacy recommendation  Continue on cardiac telemetry  Labs and vitals reviewed this AM.  WBC 17.7 this morning.  It has been steadily climbing since her surgery.  Hg 9.1, improved from previous days Up with therapy today.  She is urinating well without pain. Denies any abdominal pain.  Discharge is dependent on patient's stability and hospital course.  Will  continue to follow along with inpatient medicine and their recommendations for discharge  DVT Prophylaxis -TED hose and transition to heparin during workup Non-weightbearing to the right arm.  Danise Edge, PA-C [facility] 09/01/2023, 12:26 PM

## 2023-09-01 NOTE — Progress Notes (Signed)
ANTICOAGULATION CONSULT NOTE   Pharmacy Consult for Heparin Infusion Indication:  NSTEMI  Allergies  Allergen Reactions   Gabapentin Other (See Comments)    Weight gain   Ibuprofen Other (See Comments)    Headache    Patient Measurements: Height: 5\' 3"  (160 cm) Weight: 74.8 kg (165 lb) IBW/kg (Calculated) : 52.4 Heparin Dosing Weight: 68.3 kg  Vital Signs: Temp: 99 F (37.2 C) (09/18 2310) Temp Source: Oral (09/18 1922) BP: 157/77 (09/18 2310) Pulse Rate: 102 (09/18 2310)  Labs: Recent Labs    08/31/23 0536 08/31/23 1305 08/31/23 1313 08/31/23 1442 08/31/23 1535 08/31/23 2138 09/01/23 0118  HGB 8.0* 8.8*  --   --   --   --  9.1*  HCT 25.6* 28.5*  --   --   --   --  28.0*  PLT 216 245  --   --   --   --  233  APTT  --   --   --   --  31  --   --   LABPROT  --   --   --   --  14.5  --   --   INR  --   --   --   --  1.1  --   --   HEPARINUNFRC  --   --   --   --   --   --  0.14*  CREATININE 1.86*  --  1.98*  --   --   --   --   TROPONINIHS  --   --  145* 154*  --  156*  --     Estimated Creatinine Clearance: 25.6 mL/min (A) (by C-G formula based on SCr of 1.98 mg/dL (H)).   Medical History: Past Medical History:  Diagnosis Date   Acute hypoxemic respiratory failure (HCC)    Anemia in stage 4 chronic kidney disease (HCC)    Anginal pain (HCC)    Anxiety    Aortic atherosclerosis (HCC)    CHF (congestive heart failure) (HCC)    a.) TTE 09/30/2021: EF 55-60%, no RWMAs, mild MR, G1DD   Chronic pain    Chronic radicular pain of lower back    CKD (chronic kidney disease), stage IV (HCC)    Complication of anesthesia    a.) delayed emergence following ureteroscopy 07/2023   Coronary artery calcification seen on CT scan    Costochondritis    DDD (degenerative disc disease), lumbar    Depression    Dyspnea    GERD (gastroesophageal reflux disease)    Hiatal hernia    Hip dysplasia    Hyperlipidemia    Hypertension    Insomnia    Iron deficiency    Iron  deficiency anemia    Low back pain    Low vitamin B12 level    Lumbar facet joint pain    Migraines    Nose colonized with MRSA 10/03/2020   a.) preop PCR (+) 10/03/2020 prior to RIGHT TKA; b.) preop PCR (+) 08/25/2023 prior to RIGHT REVERSE SHOULDER ARTHROPLASTY; BICEPS TENODESIS   Numbness and tingling of right leg    OA (osteoarthritis)    Obesity    OSA (obstructive sleep apnea)    a.) not currently utilizing nocturnal PAP therapy; positional. PCCM feels as if symptom can be mitigated with diet/exercise/weight loss unless develops significant symptoms.   Osteoporosis    a.) recieves denosumab injections   Pelvic fracture (HCC)    Pneumonia    Pulmonary  fibrosis (HCC)    Raynaud's disease without gangrene    Restless leg syndrome    a.) on pramipexole + oral Fe supplementation   Rheumatoid arthritis (HCC)    a.) Tx'd with hydroxychloroquine   Right renal mass 05/28/2023   a.) CT renal 05/28/2023:  5.7 cm mass in the medial aspect of right kidney   Seasonal allergies    Thoracic compression fracture [facility])      Assessment: Patient is a 72 year old with a past medical history of idiopathic pulmonary fibrosis, history of right renal mass favoring renal cell carcinoma, CKD stage IIIb/IV, chronic constipation, anxiety, heart failure preserved ejection fraction, anxiety, mixed iron deficiency anemia and anemia presumed secondary to stage IV 6 ED, history of hip dysplasia, hyperlipidemia, hypertension, insomnia, osteoarthritis, obesity, obstructive sleep apnea with history of nonutilization of CPAP therapy, osteoporosis, Raynaud's disease without gangrene, restless leg syndrome, rheumatoid arthritis, coronary artery disease, history of thoracic compression fracture. Pharmacy was consulted to initiate patient on a heparin infusion for NSTEMI. Patient was not on anticoagulation prior to admission, therefore ok to monitor with heparin levels.  Goal of Therapy:  Heparin level 0.3-0.7  units/ml Monitor platelets by anticoagulation protocol: Yes   09/19 0118 HL 0.14, subtherapeutic  Plan:  Bolus 2000 unit x 1 Increase heparin infusion to 1000 units/hr Recheck HL in 8 hr after rate change CBC daily while on heparin  Otelia Sergeant, PharmD, [facility] 09/01/2023 2:55 AM

## 2023-09-01 NOTE — Progress Notes (Signed)
ANTICOAGULATION CONSULT NOTE   Pharmacy Consult for Heparin Infusion Indication:  NSTEMI  Allergies  Allergen Reactions   Gabapentin Other (See Comments)    Weight gain   Ibuprofen Other (See Comments)    Headache    Patient Measurements: Height: 5\' 3"  (160 cm) Weight: 74.8 kg (165 lb) IBW/kg (Calculated) : 52.4 Heparin Dosing Weight: 68.3 kg  Vital Signs: Temp: 98.2 F (36.8 C) (09/19 1913) Temp Source: Oral (09/19 1717) BP: 121/62 (09/19 1913) Pulse Rate: 96 (09/19 1913)  Labs: Recent Labs    08/31/23 0536 08/31/23 1305 08/31/23 1313 08/31/23 1313 08/31/23 1442 08/31/23 1535 08/31/23 2138 09/01/23 0118 09/01/23 1112 09/01/23 2201  HGB 8.0* 8.8*  --   --   --   --   --  9.1*  --   --   HCT 25.6* 28.5*  --   --   --   --   --  28.0*  --   --   PLT 216 245  --   --   --   --   --  233  --   --   APTT  --   --   --   --   --  31  --   --   --   --   LABPROT  --   --   --   --   --  14.5  --   --   --   --   INR  --   --   --   --   --  1.1  --   --   --   --   HEPARINUNFRC  --   --   --   --   --   --   --  0.14* 0.15* 0.29*  CREATININE 1.86*  --  1.98*  --   --   --   --   --  1.94*  --   TROPONINIHS  --   --  145*   < > 154*  --  156*  --  171*  --    < > = values in this interval not displayed.    Estimated Creatinine Clearance: 26.2 mL/min (A) (by C-G formula based on SCr of 1.94 mg/dL (H)).   Medical History: Past Medical History:  Diagnosis Date   Acute hypoxemic respiratory failure (HCC)    Anemia in stage 4 chronic kidney disease (HCC)    Anginal pain (HCC)    Anxiety    Aortic atherosclerosis (HCC)    CHF (congestive heart failure) (HCC)    a.) TTE 09/30/2021: EF 55-60%, no RWMAs, mild MR, G1DD   Chronic pain    Chronic radicular pain of lower back    CKD (chronic kidney disease), stage IV (HCC)    Complication of anesthesia    a.) delayed emergence following ureteroscopy 07/2023   Coronary artery calcification seen on CT scan     Costochondritis    DDD (degenerative disc disease), lumbar    Depression    Dyspnea    GERD (gastroesophageal reflux disease)    Hiatal hernia    Hip dysplasia    Hyperlipidemia    Hypertension    Insomnia    Iron deficiency    Iron deficiency anemia    Low back pain    Low vitamin B12 level    Lumbar facet joint pain    Migraines    Nose colonized with MRSA 10/03/2020   a.) preop PCR (+)  10/03/2020 prior to RIGHT TKA; b.) preop PCR (+) 08/25/2023 prior to RIGHT REVERSE SHOULDER ARTHROPLASTY; BICEPS TENODESIS   Numbness and tingling of right leg    OA (osteoarthritis)    Obesity    OSA (obstructive sleep apnea)    a.) not currently utilizing nocturnal PAP therapy; positional. PCCM feels as if symptom can be mitigated with diet/exercise/weight loss unless develops significant symptoms.   Osteoporosis    a.) recieves denosumab injections   Pelvic fracture (HCC)    Pneumonia    Pulmonary fibrosis (HCC)    Raynaud's disease without gangrene    Restless leg syndrome    a.) on pramipexole + oral Fe supplementation   Rheumatoid arthritis (HCC)    a.) Tx'd with hydroxychloroquine   Right renal mass 05/28/2023   a.) CT renal 05/28/2023:  5.7 cm mass in the medial aspect of right kidney   Seasonal allergies    Thoracic compression fracture [facility])      Assessment: Patient is a 72 year old with a past medical history of idiopathic pulmonary fibrosis, history of right renal mass favoring renal cell carcinoma, CKD stage IIIb/IV, chronic constipation, anxiety, heart failure preserved ejection fraction, anxiety, mixed iron deficiency anemia and anemia presumed secondary to stage IV 6 ED, history of hip dysplasia, hyperlipidemia, hypertension, insomnia, osteoarthritis, obesity, obstructive sleep apnea with history of nonutilization of CPAP therapy, osteoporosis, Raynaud's disease without gangrene, restless leg syndrome, rheumatoid arthritis, coronary artery disease, history of thoracic  compression fracture. Pharmacy was consulted to initiate patient on a heparin infusion for NSTEMI. Patient was not on anticoagulation prior to admission, therefore ok to monitor with heparin levels.  Goal of Therapy:  Heparin level 0.3-0.7 units/ml Monitor platelets by anticoagulation protocol: Yes   09/19 0118 HL 0.14, subtherapeutic 09/19 1112 HL 0.15 subtherapeutic.  09/19 2201 HL 0.29, subtherapeutic   Plan:  Heparin level is subtherapeutic. Will give heparin bolus of 1000 units x 1 and increase heparin infusion to 1350 units/hr. Recheck heparin level in 8 hours. CBC daily while on heparin.   Sharen Hones, PharmD, BCPS Clinical Pharmacist   09/01/2023 10:48 PM

## 2023-09-01 NOTE — Progress Notes (Signed)
ANTICOAGULATION CONSULT NOTE   Pharmacy Consult for Heparin Infusion Indication:  NSTEMI  Allergies  Allergen Reactions   Gabapentin Other (See Comments)    Weight gain   Ibuprofen Other (See Comments)    Headache    Patient Measurements: Height: 5\' 3"  (160 cm) Weight: 74.8 kg (165 lb) IBW/kg (Calculated) : 52.4 Heparin Dosing Weight: 68.3 kg  Vital Signs: Temp: 98.2 F (36.8 C) (09/19 0736) BP: 130/67 (09/19 0736) Pulse Rate: 93 (09/19 0736)  Labs: Recent Labs    08/31/23 0536 08/31/23 1305 08/31/23 1313 08/31/23 1442 08/31/23 1535 08/31/23 2138 09/01/23 0118 09/01/23 1112  HGB 8.0* 8.8*  --   --   --   --  9.1*  --   HCT 25.6* 28.5*  --   --   --   --  28.0*  --   PLT 216 245  --   --   --   --  233  --   APTT  --   --   --   --  31  --   --   --   LABPROT  --   --   --   --  14.5  --   --   --   INR  --   --   --   --  1.1  --   --   --   HEPARINUNFRC  --   --   --   --   --   --  0.14* 0.15*  CREATININE 1.86*  --  1.98*  --   --   --   --   --   TROPONINIHS  --   --  145* 154*  --  156*  --   --     Estimated Creatinine Clearance: 25.6 mL/min (A) (by C-G formula based on SCr of 1.98 mg/dL (H)).   Medical History: Past Medical History:  Diagnosis Date   Acute hypoxemic respiratory failure (HCC)    Anemia in stage 4 chronic kidney disease (HCC)    Anginal pain (HCC)    Anxiety    Aortic atherosclerosis (HCC)    CHF (congestive heart failure) (HCC)    a.) TTE 09/30/2021: EF 55-60%, no RWMAs, mild MR, G1DD   Chronic pain    Chronic radicular pain of lower back    CKD (chronic kidney disease), stage IV (HCC)    Complication of anesthesia    a.) delayed emergence following ureteroscopy 07/2023   Coronary artery calcification seen on CT scan    Costochondritis    DDD (degenerative disc disease), lumbar    Depression    Dyspnea    GERD (gastroesophageal reflux disease)    Hiatal hernia    Hip dysplasia    Hyperlipidemia    Hypertension     Insomnia    Iron deficiency    Iron deficiency anemia    Low back pain    Low vitamin B12 level    Lumbar facet joint pain    Migraines    Nose colonized with MRSA 10/03/2020   a.) preop PCR (+) 10/03/2020 prior to RIGHT TKA; b.) preop PCR (+) 08/25/2023 prior to RIGHT REVERSE SHOULDER ARTHROPLASTY; BICEPS TENODESIS   Numbness and tingling of right leg    OA (osteoarthritis)    Obesity    OSA (obstructive sleep apnea)    a.) not currently utilizing nocturnal PAP therapy; positional. PCCM feels as if symptom can be mitigated with diet/exercise/weight loss unless develops significant symptoms.  Osteoporosis    a.) recieves denosumab injections   Pelvic fracture (HCC)    Pneumonia    Pulmonary fibrosis (HCC)    Raynaud's disease without gangrene    Restless leg syndrome    a.) on pramipexole + oral Fe supplementation   Rheumatoid arthritis (HCC)    a.) Tx'd with hydroxychloroquine   Right renal mass 05/28/2023   a.) CT renal 05/28/2023:  5.7 cm mass in the medial aspect of right kidney   Seasonal allergies    Thoracic compression fracture [facility])      Assessment: Patient is a 72 year old with a past medical history of idiopathic pulmonary fibrosis, history of right renal mass favoring renal cell carcinoma, CKD stage IIIb/IV, chronic constipation, anxiety, heart failure preserved ejection fraction, anxiety, mixed iron deficiency anemia and anemia presumed secondary to stage IV 6 ED, history of hip dysplasia, hyperlipidemia, hypertension, insomnia, osteoarthritis, obesity, obstructive sleep apnea with history of nonutilization of CPAP therapy, osteoporosis, Raynaud's disease without gangrene, restless leg syndrome, rheumatoid arthritis, coronary artery disease, history of thoracic compression fracture. Pharmacy was consulted to initiate patient on a heparin infusion for NSTEMI. Patient was not on anticoagulation prior to admission, therefore ok to monitor with heparin levels.  Goal of  Therapy:  Heparin level 0.3-0.7 units/ml Monitor platelets by anticoagulation protocol: Yes   09/19 0118 HL 0.14, subtherapeutic 09/19 1112 HL 0.15 subtherapeutic.   Plan:  Heparin level is subtherapeutic even after the adjustment. Will give heparin bolus of 4000 units x 1 and increase heparin infusion to 1200 units/hr. Recheck heparin level in 8 hours. CBC daily while on heparin.   Paschal Dopp, PharmD, 09/01/2023 12:39 PM

## 2023-09-01 NOTE — Care Management Important Message (Signed)
Important Message  Patient Details  Name: Tonya Cummings MRN: [id number] Date of Birth: Jan 09, 1951   Medicare Important Message Given:  N/A - LOS <3 / Initial given by admissions     [name] 09/01/2023, 3:17 PM

## 2023-09-01 NOTE — Progress Notes (Signed)
Initial Nutrition Assessment  DOCUMENTATION CODES:   Not applicable  INTERVENTION:   -Liberalize diet to regular for wider variety of meal selections -MVI with minerals daily -Ensure Enlive po TID, each supplement provides 350 kcal and 20 grams of protein.   NUTRITION DIAGNOSIS:   Increased nutrient needs related to post-op healing as evidenced by estimated needs.  GOAL:   Patient will meet greater than or equal to 90% of their needs  MONITOR:   PO intake, Supplement acceptance  REASON FOR ASSESSMENT:   Malnutrition Screening Tool    ASSESSMENT:   Pt with PMH of CAD, CHF, aortic atherosclerosis, HTN, HLD, ILD, pulmonary fibrosis, OSAH (no nocturnal PAP therapy), CKD-IV, GERD (on daily PPI), large hiatal hernia, anemia of chronic renal disease, RIGHT renal mass, RA, OA, osteoporosis, chronic thoracic compression fractures, RLS, lumbar DDD with associated chronic radicular lower back pain, anxiety, depression, insomnia. Admitted with respiratory failure s/p rt shoulder arthroplasty.  Pt admitted with acute hypoxic respiratory failure, with dyspnea on exertion and bilateral calf tenderness right greater than the left.   9/17- s/p Right reverse total shoulder arthroplasty, Right biceps tenodesis   Pt underwent shoulder replacement yesterday, however, discharge held secondary to increased dyspnea on exertion and decreased O2 saturation requirement. Per MD notes, CXR showing possible pulmonary edema vs aspiration pneumonia, more likely edema. R hemidiaphragm elevation likely due to interscalene nerve block and should self-resolve as block wears off.   Spoke with pt at bedside, who was pleasant and in good spirit today. She reports feeling a little today. Pt shares that she has had a poor appetite over the past year secondary to cancer and cancer treatments. She usually consumes 2 meals per day (Breakfast: cereal; Dinner: meat, starch, and vegetable). Pt also admits to trying to  choose healthy snacks, such as green peppers, during the day. Pt consumed a few bites of eggs and a half of a bagel for dinner.   Pt endorses a 70# wt loss over the past year. Reviewed wt hx; wt has been stable over the past 3 months.   Discussed importance of good meal and supplement intake to promote healing. Pt reports she has tried several protein shakes in the past, but is not consistent with them secondary to financial restraints and is picky about which flavors she likes. Pt amenable to Ensure.   Medications reviewed and include vitamin D3, vitamin B-12, colace, ferrous sulfate, remeron, senokot, and aldactone.   Labs reviewed.    NUTRITION - FOCUSED PHYSICAL EXAM:  Flowsheet Row Most Recent Value  Orbital Region No depletion  Upper Arm Region No depletion  Thoracic and Lumbar Region No depletion  Buccal Region No depletion  Temple Region No depletion  Clavicle Bone Region No depletion  Clavicle and Acromion Bone Region No depletion  Scapular Bone Region No depletion  Dorsal Hand No depletion  Patellar Region No depletion  Anterior Thigh Region No depletion  Posterior Calf Region No depletion  Edema (RD Assessment) Mild  Hair Reviewed  Eyes Reviewed  Mouth Reviewed  Skin Reviewed  Nails Reviewed       Diet Order:   Diet Order             Diet regular Room service appropriate? Yes; Fluid consistency: Thin  Diet effective now                   EDUCATION NEEDS:   Education needs have been addressed  Skin:  Skin Assessment: Skin Integrity Issues: Skin Integrity  Issues:: Incisions Incisions: closed rt shoulder  Last BM:  08/29/23  Height:   Ht Readings from Last 1 Encounters:  08/30/23 5\' 3"  (1.6 m)    Weight:   Wt Readings from Last 1 Encounters:  08/30/23 74.8 kg    Ideal Body Weight:  52.3 kg  BMI:  Body mass index is 29.23 kg/m.  Estimated Nutritional Needs:   Kcal:  1600-1800  Protein:  80-95 grams  Fluid:  > 1.6 L    Levada Schilling, RD, LDN, CDCES Registered Dietitian II Certified Diabetes Care and Education Specialist Please refer to [facility] for RD and/or RD on-call/weekend/after hours pager

## 2023-09-01 NOTE — Plan of Care (Signed)
Problem: Education: Goal: Knowledge of the prescribed therapeutic regimen will improve Outcome: Progressing Goal: Understanding of activity limitations/precautions following surgery will improve Outcome: Progressing Goal: Individualized Educational Video(s) Outcome: Progressing   Problem: Activity: Goal: Ability to tolerate increased activity will improve Outcome: Progressing   Problem: Pain Management: Goal: Pain level will decrease with appropriate interventions Outcome: Progressing   Problem: Education: Goal: Knowledge of the prescribed therapeutic regimen will improve Outcome: Progressing Goal: Understanding of activity limitations/precautions following surgery will improve Outcome: Progressing Goal: Individualized Educational Video(s) Outcome: Progressing   Problem: Activity: Goal: Ability to tolerate increased activity will improve Outcome: Progressing   Problem: Pain Management: Goal: Pain level will decrease with appropriate interventions Outcome: Progressing   Problem: Education: Goal: Knowledge of General Education information will improve Description: Including pain rating scale, medication(s)/side effects and non-pharmacologic comfort measures Outcome: Progressing   Problem: Health Behavior/Discharge Planning: Goal: Ability to manage health-related needs will improve Outcome: Progressing   Problem: Clinical Measurements: Goal: Ability to maintain clinical measurements within normal limits will improve Outcome: Progressing Goal: Will remain free from infection Outcome: Progressing Goal: Diagnostic test results will improve Outcome: Progressing Goal: Respiratory complications will improve Outcome: Progressing Goal: Cardiovascular complication will be avoided Outcome: Progressing   Problem: Activity: Goal: Risk for activity intolerance will decrease Outcome: Progressing   Problem: Nutrition: Goal: Adequate nutrition will be maintained Outcome:  Progressing   Problem: Coping: Goal: Level of anxiety will decrease Outcome: Progressing   Problem: Elimination: Goal: Will not experience complications related to bowel motility Outcome: Progressing Goal: Will not experience complications related to urinary retention Outcome: Progressing   Problem: Pain Managment: Goal: General experience of comfort will improve Outcome: Progressing   Problem: Safety: Goal: Ability to remain free from injury will improve Outcome: Progressing   Problem: Skin Integrity: Goal: Risk for impaired skin integrity will decrease Outcome: Progressing

## 2023-09-02 DIAGNOSIS — J9601 Acute respiratory failure with hypoxia: Secondary | ICD-10-CM | POA: Diagnosis not present

## 2023-09-02 LAB — CBC
HCT: 28.4 % — ABNORMAL LOW (ref 36.0–46.0)
Hemoglobin: 9 g/dL — ABNORMAL LOW (ref 12.0–15.0)
MCH: 29.4 pg (ref 26.0–34.0)
MCHC: 31.7 g/dL (ref 30.0–36.0)
MCV: 92.8 fL (ref 80.0–100.0)
Platelets: 244 10*3/uL (ref 150–400)
RBC: 3.06 MIL/uL — ABNORMAL LOW (ref 3.87–5.11)
RDW: 14.9 % (ref 11.5–15.5)
WBC: 13.7 10*3/uL — ABNORMAL HIGH (ref 4.0–10.5)
nRBC: 0 % (ref 0.0–0.2)

## 2023-09-02 LAB — MAGNESIUM: Magnesium: 1.6 mg/dL — ABNORMAL LOW (ref 1.7–2.4)

## 2023-09-02 LAB — BASIC METABOLIC PANEL
Anion gap: 11 (ref 5–15)
BUN: 42 mg/dL — ABNORMAL HIGH (ref 8–23)
CO2: 27 mmol/L (ref 22–32)
Calcium: 8.3 mg/dL — ABNORMAL LOW (ref 8.9–10.3)
Chloride: 100 mmol/L (ref 98–111)
Creatinine, Ser: 1.9 mg/dL — ABNORMAL HIGH (ref 0.44–1.00)
GFR, Estimated: 28 mL/min — ABNORMAL LOW (ref >60)
Glucose, Bld: 128 mg/dL — ABNORMAL HIGH (ref 70–99)
Potassium: 3.7 mmol/L (ref 3.5–5.1)
Sodium: 138 mmol/L (ref 135–145)

## 2023-09-02 LAB — HEPARIN LEVEL (UNFRACTIONATED): Heparin Unfractionated: 0.25 IU/mL — ABNORMAL LOW (ref 0.30–0.70)

## 2023-09-02 LAB — TROPONIN I (HIGH SENSITIVITY): Troponin I (High Sensitivity): 172 ng/L (ref <18)

## 2023-09-02 MED ORDER — ASPIRIN 81 MG PO TBEC: 81.0000 mg | DELAYED_RELEASE_TABLET | Freq: Every day | ORAL | Status: DC

## 2023-09-02 MED ORDER — FUROSEMIDE 40 MG PO TABS: 60.0000 mg | ORAL_TABLET | Freq: Every morning | ORAL | Status: DC

## 2023-09-02 MED ORDER — FUROSEMIDE 40 MG PO TABS: 60.0000 mg | ORAL_TABLET | Freq: Every morning | ORAL | 0 refills | Status: DC

## 2023-09-02 MED ORDER — HEPARIN BOLUS VIA INFUSION
3000.0000 [IU] | Freq: Once | INTRAVENOUS | Status: AC
  Administered 2023-09-02: 3000 [IU] via INTRAVENOUS
  Filled 2023-09-02: qty 3000

## 2023-09-02 NOTE — TOC Initial Note (Signed)
Transition of Care [facility]) - Initial/Assessment Note    Patient Details  Name: Tonya Cummings MRN: [id number] Date of Birth: 22-Nov-1951  Transition of Care [facility]) CM/SW Contact:    Truddie Hidden, RN Phone Number: 09/02/2023, 10:08 AM  Clinical Narrative:                 Admitted for: Acute hypoxemic frespiratory failure  Admitted from: Home, Lives with spouse, daughter and grandchildren  WUJ:WJXBJY, Hermenia Fiscal, NP Pharmacy: [address] Current home health/prior home health/DME:walker, shower chair, rollator, [facility] Transportation: husband, daughter for appointment and for discharge home HH: Patient is agreeable (Does not have a preference)   Patient advised the accepting [facility] agency would contact her directly to schedule the soc within 48 hours of discharge.   Referral and accepted sent to [place] from [facility]   Expected Discharge Plan: Home w Home Health Services Barriers to Discharge: Continued Medical Work up   Patient Goals and CMS Choice Patient states their goals for this hospitalization and ongoing recovery are:: home CMS Medicare.gov Compare Post Acute Care list provided to:: Patient Choice offered to / list presented to : Patient      Expected Discharge Plan and Services     Post Acute Care Choice: Home Health Living arrangements for the past 2 months: Single Family Home Expected Discharge Date: 08/31/23                         HH Arranged: PT HH Agency: [facility] Date HH Agency Contacted: 09/02/23 Time HH Agency Contacted: [phone] Representative spoke with at [facility] Agency: [place]  Prior Living Arrangements/Services Living arrangements for the past 2 months: Single Family Home Lives with:: Adult Children, Spouse, Other (Comment) (grandchildren) Patient language and need for interpreter reviewed:: Yes Do you feel safe going back to the place where you live?: Yes      Need for Family Participation in Patient Care: Yes  (Comment) Care giver support system in place?: Yes (comment)   Criminal Activity/Legal Involvement Pertinent to Current Situation/Hospitalization: No - Comment as needed  Activities of Daily Living Home Assistive Devices/Equipment: Brace (specify type), Cane (specify quad or straight), Eyeglasses, Grab bars in shower, Walker (specify type), Wheelchair ADL Screening (condition at time of admission) Patient's cognitive ability adequate to safely complete daily activities?: Yes Is the patient deaf or have difficulty hearing?: No Does the patient have difficulty seeing, even when wearing glasses/contacts?: Yes Does the patient have difficulty concentrating, remembering, or making decisions?: Yes Patient able to express need for assistance with ADLs?: Yes Does the patient have difficulty dressing or bathing?: No Independently performs ADLs?: Yes (appropriate for developmental age) Does the patient have difficulty walking or climbing stairs?: Yes Weakness of Legs: Both Weakness of Arms/Hands: Both  Permission Sought/Granted                  Emotional Assessment Appearance:: Appears stated age Attitude/Demeanor/Rapport: Gracious, Engaged Affect (typically observed): Accepting Orientation: : Oriented to Place, Oriented to Self, Oriented to  Time, Oriented to Situation Alcohol / Substance Use: Not Applicable Psych Involvement: No (comment)  Admission diagnosis:  Rotator cuff arthropathy [M12.819] Acute hypoxemic respiratory failure (HCC) [J96.01] Patient Active Problem List   Diagnosis Date Noted   Renal mass, right 08/31/2023   Leukocytosis 08/31/2023   OSA (obstructive sleep apnea) 08/31/2023   Elevated troponin 08/31/2023   Leg pain, bilateral 08/31/2023   At risk for constipation 08/31/2023   Rotator  cuff arthropathy 08/30/2023   Anemia in stage 4 chronic kidney disease (HCC) 01/10/2023   Long-term use of immunosuppressant medication 03/09/2022   Dyspnea 09/29/2021   Acute  CHF (congestive heart failure) (HCC) 09/29/2021   Acute hypoxemic respiratory failure (HCC) 09/29/2021   Total knee replacement status 10/13/2020   CKD (chronic kidney disease) stage 3, GFR 30-59 ml/min (HCC) 10/12/2020   Hyperlipidemia 10/12/2020   Hypertension 10/12/2020   Migraine headache 10/12/2020   Restless legs syndrome (RLS) 10/12/2020   Severe obesity (BMI 35.0-39.9) with comorbidity (HCC) 06/30/2020   Primary osteoarthritis of right knee 03/27/2020   Iron deficiency anemia 09/12/2019   Low vitamin B12 level 09/12/2019   Normocytic anemia 09/07/2019   Right medial knee pain 08/09/2018   Depression with anxiety 03/22/2018   Sleep disorder 03/22/2018   Influenza A 02/17/2018   Community acquired pneumonia 02/17/2018   Chronic radicular pain of lower back 02/06/2018   Numbness and tingling of right leg 02/06/2018   Hip dysplasia 02/23/2016   Osteoporosis, post-menopausal 02/23/2016   Raynaud's disease without gangrene 02/23/2016   Seropositive rheumatoid arthritis (HCC) 02/23/2016   Lumbar facet joint pain 09/18/2014   PCP:  Myrene Buddy, NP Pharmacy:   [facility] [phone] [facility] [place] - [address] AT [facility] [address] [place] [address] Phone: [phone] Fax: [phone]     Social Determinants of Health (SDOH) Social History: SDOH Screenings   Food Insecurity: No Food Insecurity (08/30/2023)  Housing: Low Risk  (08/30/2023)  Transportation Needs: No Transportation Needs (08/30/2023)  Utilities: Not At Risk (08/30/2023)  Depression (PHQ2-9): Low Risk  (09/02/2022)  Tobacco Use: Low Risk  (08/31/2023)   SDOH Interventions:     Readmission Risk Interventions    09/02/2023   10:04 AM  Readmission Risk Prevention Plan  Transportation Screening Complete  PCP or Specialist Appt within 3-5 Days Complete  HRI or Home Care Consult Complete  Social Work Consult for Recovery Care Planning/Counseling Complete   Palliative Care Screening Not Applicable  Medication Review Oceanographer) Complete

## 2023-09-02 NOTE — Progress Notes (Signed)
ANTICOAGULATION CONSULT NOTE   Pharmacy Consult for Heparin Infusion Indication:  NSTEMI  Allergies  Allergen Reactions   Gabapentin Other (See Comments)    Weight gain   Ibuprofen Other (See Comments)    Headache    Patient Measurements: Height: 5\' 3"  (160 cm) Weight: 74.8 kg (165 lb) IBW/kg (Calculated) : 52.4 Heparin Dosing Weight: 68.3 kg  Vital Signs: Temp: 98.4 F (36.9 C) (09/20 0737) Temp Source: Oral (09/20 0412) BP: 117/74 (09/20 0737) Pulse Rate: 98 (09/20 0737)  Labs: Recent Labs    08/31/23 1305 08/31/23 1305 08/31/23 1313 08/31/23 1442 08/31/23 1535 08/31/23 2138 09/01/23 0118 09/01/23 1112 09/01/23 2201 09/02/23 0410 09/02/23 0759  HGB 8.8*  --   --   --   --   --  9.1*  --   --  9.0*  --   HCT 28.5*  --   --   --   --   --  28.0*  --   --  28.4*  --   PLT 245  --   --   --   --   --  233  --   --  244  --   APTT  --   --   --   --  31  --   --   --   --   --   --   LABPROT  --   --   --   --  14.5  --   --   --   --   --   --   INR  --   --   --   --  1.1  --   --   --   --   --   --   HEPARINUNFRC  --    < >  --   --   --   --  0.14* 0.15* 0.29*  --  0.25*  CREATININE  --   --  1.98*  --   --   --   --  1.94*  --  1.90*  --   TROPONINIHS  --   --  145*   < >  --  156*  --  171*  --  172*  --    < > = values in this interval not displayed.    Estimated Creatinine Clearance: 26.7 mL/min (A) (by C-G formula based on SCr of 1.9 mg/dL (H)).   Medical History: Past Medical History:  Diagnosis Date   Acute hypoxemic respiratory failure (HCC)    Anemia in stage 4 chronic kidney disease (HCC)    Anginal pain (HCC)    Anxiety    Aortic atherosclerosis (HCC)    CHF (congestive heart failure) (HCC)    a.) TTE 09/30/2021: EF 55-60%, no RWMAs, mild MR, G1DD   Chronic pain    Chronic radicular pain of lower back    CKD (chronic kidney disease), stage IV (HCC)    Complication of anesthesia    a.) delayed emergence following ureteroscopy 07/2023    Coronary artery calcification seen on CT scan    Costochondritis    DDD (degenerative disc disease), lumbar    Depression    Dyspnea    GERD (gastroesophageal reflux disease)    Hiatal hernia    Hip dysplasia    Hyperlipidemia    Hypertension    Insomnia    Iron deficiency    Iron deficiency anemia    Low back pain    Low  vitamin B12 level    Lumbar facet joint pain    Migraines    Nose colonized with MRSA 10/03/2020   a.) preop PCR (+) 10/03/2020 prior to RIGHT TKA; b.) preop PCR (+) 08/25/2023 prior to RIGHT REVERSE SHOULDER ARTHROPLASTY; BICEPS TENODESIS   Numbness and tingling of right leg    OA (osteoarthritis)    Obesity    OSA (obstructive sleep apnea)    a.) not currently utilizing nocturnal PAP therapy; positional. PCCM feels as if symptom can be mitigated with diet/exercise/weight loss unless develops significant symptoms.   Osteoporosis    a.) recieves denosumab injections   Pelvic fracture (HCC)    Pneumonia    Pulmonary fibrosis (HCC)    Raynaud's disease without gangrene    Restless leg syndrome    a.) on pramipexole + oral Fe supplementation   Rheumatoid arthritis (HCC)    a.) Tx'd with hydroxychloroquine   Right renal mass 05/28/2023   a.) CT renal 05/28/2023:  5.7 cm mass in the medial aspect of right kidney   Seasonal allergies    Thoracic compression fracture [facility])      Assessment: Patient is a 72 year old with a past medical history of idiopathic pulmonary fibrosis, history of right renal mass favoring renal cell carcinoma, CKD stage IIIb/IV, chronic constipation, anxiety, heart failure preserved ejection fraction, anxiety, mixed iron deficiency anemia and anemia presumed secondary to stage IV 6 ED, history of hip dysplasia, hyperlipidemia, hypertension, insomnia, osteoarthritis, obesity, obstructive sleep apnea with history of nonutilization of CPAP therapy, osteoporosis, Raynaud's disease without gangrene, restless leg syndrome, rheumatoid arthritis,  coronary artery disease, history of thoracic compression fracture. Pharmacy was consulted to initiate patient on a heparin infusion for NSTEMI. Patient was not on anticoagulation prior to admission, therefore ok to monitor with heparin levels.  Goal of Therapy:  Heparin level 0.3-0.7 units/ml Monitor platelets by anticoagulation protocol: Yes   09/19 0118 HL 0.14, subtherapeutic 09/19 1112 HL 0.15 subtherapeutic.  09/19 2201 HL 0.29, subtherapeutic  09/19 0759 HL 0.25  Plan:  Heparin level is subtherapeutic. Will give heparin bolus of 3000 units x 1 and increase heparin infusion to 1500 units/hr. Recheck heparin level in 8 hours. CBC daily while on heparin.   Ronnald Ramp, PharmD, BCPS Clinical Pharmacist   09/02/2023 9:53 AM

## 2023-09-02 NOTE — Consult Note (Signed)
[facility] Customer service manager [facility]) Accountable Care Organization (ACO) [facility] Liaison Note  09/02/2023  DAILYNN MANERA Oct 13, 1951 [id number]  Location: [facility] RN Hospital Liaison screened the patient remotely at [facility].  Insurance: [facility] HMO   JATINDER GEMBERLING is a 72 y.o. female who is a Primary Care Patient of [place], Hermenia Fiscal, NP [facility]). The patient was screened for readmission hospitalization with noted high risk score for unplanned readmission risk with 1 IP/1 ED in 6 months.  The patient was assessed for potential [facility]) Care Management service needs for post hospital transition for care coordination. Review of patient's electronic medical record reveals patient was admitted with acute hypoxemic respiratory failure. No anticipated needs as pt has [facility] arranged.     [facility] Care Management/Population Health does not replace or interfere with any arrangements made by the Inpatient Transition of Care team.   For questions contact:   Elliot Cousin, RN, [facility] Liaison [place]   Population Health Office Hours MTWF  8:00 am-6:00 pm [phone] mobile [phone] [Office toll free line] Office Hours are M-F 8:30 - 5 pm [email or web address]

## 2023-09-02 NOTE — Progress Notes (Signed)
Occupational Therapy Treatment Patient Details Name: Tonya Cummings MRN: [id number] DOB: 06-06-1951 Today's Date: 09/02/2023   History of present illness Pt is a 72 yo F diagnosed with R shoulder rotator cuff arthropathy and is s/p R reverse total shoulder arthroplasty and biceps tenodesis.  PMH includes: CAD, CHF, hiatal hernia, anxiety, depression, GERD, renal disease, arthritis, CKD, HTN, and rheumatoid arthritis.   OT comments  Pt seen for OT treatment on this date. Upon arrival to room pt supine in bed, agreeable to tx. Pt amb to bathroom with CGA + SPC, assistance needed to organize lines/leads. MIN A for clothing management during toileting, verbal cues needed to adhere to shoulder precautions. Pt reeducated on sling management. Pt participated in R extremity exercises as described below while standing, dangling arm. Pt reported increased pain with exercises. Maintained low 90s O2 on room air, during mobility. Pt making good progress toward goals, will continue to follow POC. Discharge recommendation remains appropriate.       If plan is discharge home, recommend the following:  A little help with walking and/or transfers;A little help with bathing/dressing/bathroom   Equipment Recommendations  None recommended by OT    Recommendations for Other Services      Precautions / Restrictions Precautions Precautions: Fall;Shoulder Shoulder Interventions: Shoulder abduction pillow;Don joy ultra sling;Off for dressing/bathing/exercises Restrictions Weight Bearing Restrictions: Yes RUE Weight Bearing: Non weight bearing       Mobility Bed Mobility Overal bed mobility: Modified Independent (E HOB) Bed Mobility: Supine to Sit     Supine to sit: Supervision, HOB elevated       Patient Response: Cooperative  Transfers Overall transfer level: Needs assistance Equipment used: Straight cane Transfers: Sit to/from Stand Sit to Stand: Supervision                 Balance  Overall balance assessment: Independent Sitting-balance support: No upper extremity supported, Feet supported Sitting balance-Leahy Scale: Good     Standing balance support: Single extremity supported Standing balance-Leahy Scale: Fair                             ADL either performed or assessed with clinical judgement   ADL Overall ADL's : Needs assistance/impaired                         Toilet Transfer: Event organiser Details (indicate cue type and reason): cues for shoulder precautions           General ADL Comments: MAX A don/doff shirt and sling in sitting. SBA + SPC for toilet t/f    Extremity/Trunk Assessment Upper Extremity Assessment Upper Extremity Assessment: Overall WFL for tasks assessed   Lower Extremity Assessment Lower Extremity Assessment: Overall WFL for tasks assessed   Cervical / Trunk Assessment Cervical / Trunk Assessment: Other exceptions Cervical / Trunk Exceptions: Pt reports L head tilt normal, pending next surgery    Vision       Perception     Praxis      Cognition Arousal: Alert Behavior During Therapy: WFL for tasks assessed/performed Overall Cognitive Status: Within Functional Limits for tasks assessed                                          Exercises Hand Exercises Wrist Flexion: 5 reps, Right, AROM, Standing  Wrist Extension: 5 reps, Right, AROM, Standing Digit Composite Flexion: AROM, Right, Standing, 5 reps Composite Extension: AROM, Right, Standing, 5 reps Other Exercises Other Exercises: Edu: sling mgmt, exercises to drecrease stiffness in eblow    Shoulder Instructions       General Comments      Pertinent Vitals/ Pain       Pain Assessment Pain Assessment: Faces Faces Pain Scale: Hurts even more Pain Location: R shoulder + elbow during hand/wrist stretching (elbow in extension) Pain Descriptors / Indicators: Grimacing, Tender, Sore Pain Intervention(s):  Limited activity within patient's tolerance, Monitored during session, Premedicated before session, Repositioned, Ice applied  Home Living                                          Prior Functioning/Environment              Frequency  Min 1X/week        Progress Toward Goals  OT Goals(current goals can now be found in the care plan section)  Progress towards OT goals: Progressing toward goals  Acute Rehab OT Goals Patient Stated Goal: To return home and limit pain OT Goal Formulation: With patient Time For Goal Achievement: 09/16/23 Potential to Achieve Goals: Good ADL Goals Pt Will Perform Grooming: with set-up;with supervision;standing Pt Will Perform Lower Body Dressing: with min assist;sit to/from stand Pt Will Transfer to Toilet: with supervision;ambulating;regular height toilet  Plan      Co-evaluation                 AM-PAC OT "6 Clicks" Daily Activity     Outcome Measure   Help from another person eating meals?: None Help from another person taking care of personal grooming?: None Help from another person toileting, which includes using toliet, bedpan, or urinal?: None Help from another person bathing (including washing, rinsing, drying)?: A Lot Help from another person to put on and taking off regular upper body clothing?: A Lot Help from another person to put on and taking off regular lower body clothing?: A Little 6 Click Score: 19    End of Session Equipment Utilized During Treatment: Other (comment) (SPC)  OT Visit Diagnosis: Other abnormalities of gait and mobility (R26.89);Muscle weakness (generalized) (M62.81)   Activity Tolerance Patient tolerated treatment well;Patient limited by pain   Patient Left in bed;with call bell/phone within reach;with bed alarm set   Nurse Communication          Time: 4098-1191 OT Time Calculation (min): 31 min  Charges: OT General Charges $OT Visit: 1 Visit OT Treatments $Self  Care/Home Management : 8-22 mins $Therapeutic Exercise: 8-22 mins  Glenard Haring, OTS

## 2023-09-02 NOTE — Progress Notes (Signed)
Subjective: 3 Days Post-Op Procedure(s) (LRB): Right reverse shoulder arthroplasty, biceps tenodesis (Right) Patient reports pain as mild.   Patient is well, and has had no acute complaints or problems.  She states that her overall shortness of breath and chest discomfort has improved from yesterday.  On 1L of O2 this morning.  Had a run of SVT last night. Plan is to go Home after hospital stay. Currently denies any CP, SOB Fever: no Gastrointestinal:Negative for nausea and vomiting Reports she is urinating well this AM.  No BM yet.  Objective: Vital signs in last 24 hours: Temp:  [98.1 F (36.7 C)-98.6 F (37 C)] 98.4 F (36.9 C) (09/20 0737) Pulse Rate:  [96-102] 98 (09/20 0737) Resp:  [18-22] 20 (09/20 0412) BP: (117-144)/(62-79) 117/74 (09/20 0737) SpO2:  [93 %-100 %] 97 % (09/20 0737)  Intake/Output from previous day:  Intake/Output Summary (Last 24 hours) at 09/02/2023 0753 Last data filed at 09/02/2023 0400 Gross per 24 hour  Intake 705.16 ml  Output 3100 ml  Net -2394.84 ml    Intake/Output this shift: No intake/output data recorded.  Labs: Recent Labs    08/31/23 0536 08/31/23 1305 09/01/23 0118 09/02/23 0410  HGB 8.0* 8.8* 9.1* 9.0*   Recent Labs    09/01/23 0118 09/02/23 0410  WBC 17.7* 13.7*  RBC 3.03* 3.06*  HCT 28.0* 28.4*  PLT 233 244   Recent Labs    09/01/23 1112 09/02/23 0410  NA 139 138  K 3.7 3.7  CL 101 100  CO2 27 27  BUN 40* 42*  CREATININE 1.94* 1.90*  GLUCOSE 132* 128*  CALCIUM 8.3* 8.3*   Recent Labs    08/31/23 1535  INR 1.1     EXAM General - Patient is Alert, Appropriate, and Oriented Extremity - Sling intact to the right arm. Patient is neurovascularly intact to all dermatomes of her right upper extremity now that the interscalene block has worn off.  Able to flex and extend fingers without pain. Honeycomb dressing with mild bloody drainage, intact.  No surrounding redness or swelling appreciated Hemovac drain  site intact without any drainage appreciated. New dressing was applied to the right arm this morning. Motor Function - intact, moving hands and fingers well without any issue.  Able to flex and extend fingers and wrist with normal ROM and strength.  Abdomen soft with intact bowel sounds this AM.  Past Medical History:  Diagnosis Date   Acute hypoxemic respiratory failure (HCC)    Anemia in stage 4 chronic kidney disease (HCC)    Anginal pain (HCC)    Anxiety    Aortic atherosclerosis (HCC)    CHF (congestive heart failure) (HCC)    a.) TTE 09/30/2021: EF 55-60%, no RWMAs, mild MR, G1DD   Chronic pain    Chronic radicular pain of lower back    CKD (chronic kidney disease), stage IV (HCC)    Complication of anesthesia    a.) delayed emergence following ureteroscopy 07/2023   Coronary artery calcification seen on CT scan    Costochondritis    DDD (degenerative disc disease), lumbar    Depression    Dyspnea    GERD (gastroesophageal reflux disease)    Hiatal hernia    Hip dysplasia    Hyperlipidemia    Hypertension    Insomnia    Iron deficiency    Iron deficiency anemia    Low back pain    Low vitamin B12 level    Lumbar facet joint pain  Migraines    Nose colonized with MRSA 10/03/2020   a.) preop PCR (+) 10/03/2020 prior to RIGHT TKA; b.) preop PCR (+) 08/25/2023 prior to RIGHT REVERSE SHOULDER ARTHROPLASTY; BICEPS TENODESIS   Numbness and tingling of right leg    OA (osteoarthritis)    Obesity    OSA (obstructive sleep apnea)    a.) not currently utilizing nocturnal PAP therapy; positional. PCCM feels as if symptom can be mitigated with diet/exercise/weight loss unless develops significant symptoms.   Osteoporosis    a.) recieves denosumab injections   Pelvic fracture (HCC)    Pneumonia    Pulmonary fibrosis (HCC)    Raynaud's disease without gangrene    Restless leg syndrome    a.) on pramipexole + oral Fe supplementation   Rheumatoid arthritis (HCC)    a.) Tx'd  with hydroxychloroquine   Right renal mass 05/28/2023   a.) CT renal 05/28/2023:  5.7 cm mass in the medial aspect of right kidney   Seasonal allergies    Thoracic compression fracture (HCC)     Assessment/Plan: 3 Days Post-Op Procedure(s) (LRB): Right reverse shoulder arthroplasty, biceps tenodesis (Right) Principal Problem:   Acute hypoxemic respiratory failure (HCC) Active Problems:   Iron deficiency anemia   Chronic radicular pain of lower back   Depression with anxiety   Hip dysplasia   Hyperlipidemia   Hypertension   Raynaud's disease without gangrene   Restless legs syndrome (RLS)   Seropositive rheumatoid arthritis (HCC)   Dyspnea   Long-term use of immunosuppressant medication   Anemia in stage 4 chronic kidney disease (HCC)   Rotator cuff arthropathy   Renal mass, right   Leukocytosis   OSA (obstructive sleep apnea)   Elevated troponin   Leg pain, bilateral   At risk for constipation  Estimated body mass index is 29.23 kg/m as calculated from the following:   Height as of this encounter: 5\' 3"  (1.6 m).   Weight as of this encounter: 74.8 kg. Advance diet Up with therapy D/C IV fluids when tolerating po intake.  Patient had an episode of shortness of breath and chest discomfort on 08/31/2023 while up ambulating with PT.  Inpatient medicine has been working her up and evaluating her for this issue.  Troponins and D-dimers were elevated.  VQ scan and [place] of lower extremities negative for DVT or PE.  Patient has been started on heparin per pharmacy recommendation  Continue on cardiac telemetry  Labs and vitals reviewed this AM.  WBC 13.7, improved from yesterday.   Hg 9.0 this AM. Up with therapy today.  She is urinating well without pain. Denies any abdominal pain this morning, she continues to deny any chest pain. Plan for possible discharge home pending Internal medicine evaluation.  DVT Prophylaxis -TED hose and transition to heparin during  workup Non-weightbearing to the right arm.  Valeria Batman, PA-C, CAQ-OS [facility] Orthopaedic Surgery 09/02/2023, 7:53 AM

## 2023-09-02 NOTE — Discharge Summary (Signed)
Physician Discharge Summary   Patient: Tonya Cummings MRN: [id number]  DOB: March 25, 1951   Admit:     Date of Admission: 08/30/2023 Admitted from: home   Discharge: Date of discharge: 09/02/23 Disposition: Home health Condition at discharge: good  CODE STATUS: FULL CODE     Discharge Physician: Sunnie Nielsen, DO [facility]     PCP: Myrene Buddy, NP  Recommendations for Outpatient Follow-up:  Follow up with PCP Gauger, Hermenia Fiscal, NP in 1-2 weeks Follow up as directed by orthopedics post-op Follow up with cardiology - 9/30 @ 8:25 AM w/ Charlsie Quest, NP  Please obtain labs/tests: BMP in next office visit Please follow up on the following pending results: none    Discharge Instructions     Diet - low sodium heart healthy   Complete by: As directed    Increase activity slowly   Complete by: As directed          Discharge Diagnoses: Principal Problem:   Acute hypoxemic respiratory failure (HCC) Active Problems:   Iron deficiency anemia   Chronic radicular pain of lower back   Depression with anxiety   Hip dysplasia   Hyperlipidemia   Hypertension   Raynaud's disease without gangrene   Restless legs syndrome (RLS)   Seropositive rheumatoid arthritis (HCC)   Dyspnea   Long-term use of immunosuppressant medication   Anemia in stage 4 chronic kidney disease (HCC)   Rotator cuff arthropathy   Renal mass, right   Leukocytosis   OSA (obstructive sleep apnea)   Elevated troponin   Leg pain, bilateral   At risk for constipation       Hospital Course: Tonya Cummings is a 72 year old female with history of idiopathic pulmonary fibrosis, history of right renal mass favoring renal cell carcinoma, CKD stage IIIb/IV, chronic constipation, anxiety, heart failure preserved ejection fraction, mixed iron deficiency anemia and anemia presumed secondary to stage IV CKD, history of hip dysplasia, hyperlipidemia, hypertension,  insomnia, osteoarthritis, obesity, obstructive sleep apnea with history of nonutilization of CPAP therapy, osteoporosis, Raynaud's disease without gangrene, restless leg syndrome, rheumatoid arthritis, coronary artery disease, history of thoracic compression fracture 09/17: Patient was a direct admission under orthopedic service for right shoulder rotator cuff injury requiring RT reverse shoulder arthroplasty and right biceps tenodesis. 09/18, POD1: Per orthopedic primary service, patient was participating with physical therapy and was walking up stairs when her oxygen saturation desatted to the mid 80s.  At baseline patient does not require O2 supplementation. Hospitalist consulted - VSS, SpO2 of 99% on 2 L nasal cannula. CXR read as patchy by bilateral reticulonodular and airspace opacity, differential includes pulmonary edema versus aspiration, less likely infection. WBC elevation at 12.2, Hgb 8.0, creatinine 1.86, eGFR of 29. Pt reports that whenever she feels short of breath and unable to catch her breath, she then has feelings of chest pain, also c/o LE edema and soreness worse than baseline. Ruled out PE/DVT, Echo pending, troponin elevated started on heparin. Echo pending read.  09/19: Echo EF 55-60%, G1DD. Troponin still up again some to 171, continue to trend . Pt reports breathing well today, will trial wean off O2 as able. Lasix IV given  09/20: improved w/ lasix, troponins still flat, suspect no NSTEMI but rather likely demand ischemia,  reviewed w/ Dr Kirke Corin (cardiology) agrees for d/c heparin and ok for discharge hope appreciate his help w/ arranging expedited office f/u. Pt eager for discharge home    Consultants:  orthopedics  Procedures:  08/30/23: Right reverse total shoulder arthroplasty, right biceps tenodesis      ASSESSMENT & PLAN:   Acute hypoxemic respiratory failure  and dyspnea on exertion  Unclear etiology but suspect exacerbation HFpEF Likely multifactorial in setting  of recent surgical procedure requiring anesthesia complicated by chronic illnesses (idiopathic pulmonary fibrosis, OSA nonutilization of CPAP therapy, Hx HFpEF, chronic anemia) and new diagnoses NSTEMI vs demand ischemia, possible pulmonary edema, possible aspiration pneumonia Reasonably ruled out PE w/ negative VQ Reasonably ruled out lower respiratory infection w/ negative procalcitonin however (+)L shift w/ leukocytosis does concern for infection of some kind Slight elevation BNP - echo no concerns for overt CHF but does have G1DD will trial dose lasix, strict I&O Slight elevation of high sensitive troponin - trending flat, ok to d/c heparin no need got AC   Ceftriaxone to cover possible pneumonia but this seems ulikely will d/c abx   Exacerbation HFpEF, Grade 1 diastolic dysfunction  Lasix IV x1 resulted in improvement  Increase lasix PRN instuctions at discharge Follow up w/ cardiology arranged - see AVS, appreciate confirmation from Dr Kirke Corin - 9/30 @ 8:25 AM w/ Charlsie Quest, NP   Elevated high sensitive troponin with positive delta, ruled our NSTEMI ruled in demand ischemia Heparin d/c   D-dimer elevated bilateral calf tenderness right greater than the left Ultrasound of the lower extremity to assess for DVT --> no evidence of DVT VQ scan (CTA not ordered due to patient with CKD stage IV and possible right renal cell carcinoma) --> No evidence of pulmonary emboli.   Leukocytosis w/ L shift - suspect reactive NO sustained tachycardia/tachypnea to meet criteria for SIRS/Sepsis  Procalcitonin is negative UA no concerns for UTI Trend CBC Ceftriaxone to cover possible pneumonia ubt this felt to be unlikely, d/c abx    At risk for constipation Senna docusate 1 tablet nightly ordered   OSA (obstructive sleep apnea) History of nonutilization of PAP Given the patient SpO2 desaturated with physical therapy, we will initiate CPAP nightly trial tonight to optimize patient recovery Follow  w/ PCP   Renal mass, right Continue outpatient follow-up with urology and planned MRI of the abdomen without contrast as planned   Rotator cuff arthropathy Patient is postop day 2 management per orthopedic team reconsult PT, OT when medically appropriate ASA 325 mg daily and TED hose DVT ppx    Anemia in stage 4 chronic kidney disease (HCC) At baseline   Seropositive rheumatoid arthritis (HCC) Hydroxychloroquine 200 mg p.o. twice daily   Restless legs syndrome (RLS) Continue home pramipexole dosing as ordered by orthopedic team Home ferrous sulfate resumed   Hypertension Home spironolactone 25 mg daily every morning, enalapril 10 mg daily, hydrochlorothiazide 25 mg daily   Hyperlipidemia Rosuvastatin 5 mg nightly   Depression with anxiety Home mirtazapine 30 mg nightly, venlafaxine 225 mg daily with breakfast   Iron deficiency anemia Continue home ferrous sulfate 325 mg p.o. daily with breakfast   Avoid muscle relaxer given SpO2 desaturation, OSA with non-PAP utilization, and idiopathic pulmonary fibrosis.              Discharge Instructions  Allergies as of 09/02/2023       Reactions   Gabapentin Other (See Comments)   Weight gain   Ibuprofen Other (See Comments)   Headache        Medication List     STOP taking these medications    Excedrin Tension Headache 500-65 MG Tabs per tablet Generic drug: acetaminophen-caffeine  TAKE these medications    acetaminophen 500 MG tablet Commonly known as: TYLENOL Take 2 tablets (1,000 mg total) by mouth every 6 (six) hours as needed.   aspirin EC 325 MG tablet Take 1 tablet (325 mg total) by mouth daily.   calcitRIOL 0.25 MCG capsule Commonly known as: ROCALTROL Take 0.25 mcg by mouth every morning.   Calcium Carb-Cholecalciferol 600-400 MG-UNIT Caps Take 1 capsule by mouth 3 (three) times daily.   Cholecalciferol 50 MCG (2000 UT) Caps Take 2,000 Units by mouth every morning.    cyanocobalamin 500 MCG tablet Commonly known as: VITAMIN B12 Take 500 mcg by mouth every morning.   cyclobenzaprine 10 MG tablet Commonly known as: FLEXERIL Take 10 mg by mouth at bedtime.   enalapril-hydrochlorothiazide 10-25 MG tablet Commonly known as: VASERETIC Take 1 tablet by mouth at bedtime.   ferrous sulfate 325 (65 FE) MG tablet Take 325 mg by mouth daily with breakfast.   furosemide 40 MG tablet Commonly known as: LASIX Take 1.5 tablets (60 mg total) by mouth every morning. Increase to 1.5 tablet (60 mg total) by mouth in morning and extra 1 tablet (40 mg total for maximum daily dose 100 mg) at lunch time as needed for up to 3 days for increased leg swelling, shortness of breath, weight gain 5+ lbs over 1-2 days. Seek medical care if these symptoms are not improving with increased dose. What changed: additional instructions   hydroxychloroquine 200 MG tablet Commonly known as: PLAQUENIL Take 1 tablet by mouth 2 (two) times daily.   mirtazapine 30 MG tablet Commonly known as: REMERON Take 1 tablet by mouth at bedtime.   MULTIVITAMIN ADULT PO Take 1 tablet by mouth every morning.   mupirocin ointment 2 % Commonly known as: BACTROBAN Apply small about inside of both nostrils TWICE a day for the next 5 days.   omeprazole 40 MG capsule Commonly known as: PRILOSEC Take 40 mg by mouth every morning.   ondansetron 4 MG tablet Commonly known as: ZOFRAN Take 1 tablet (4 mg total) by mouth every 6 (six) hours as needed for nausea.   oxyCODONE 5 MG immediate release tablet Commonly known as: Oxy IR/ROXICODONE Take 1-2 tablets (5-10 mg total) by mouth every 4 (four) hours as needed for moderate pain or severe pain.   Pirfenidone 267 MG Tabs Take 3 tablets by mouth 3 (three) times daily.   pramipexole 0.125 MG tablet Commonly known as: MIRAPEX Take 0.125 mg by mouth daily. Take 0.125 mg in the morning and 2 at bedtime   Prolia 60 MG/ML Sosy injection Generic  drug: denosumab Inject 60 mg into the skin every 6 (six) months.   rosuvastatin 5 MG tablet Commonly known as: CRESTOR Take 5 mg by mouth at bedtime. Take every other night   spironolactone 25 MG tablet Commonly known as: ALDACTONE Take 25 mg by mouth every morning.   Venlafaxine HCl 75 MG Tb24 Take 1 tablet by mouth every morning. Take daily with 150 mg for total 225 mg daily   venlafaxine XR 150 MG 24 hr capsule Commonly known as: EFFEXOR-XR Take 150 mg by mouth daily with breakfast. Take with 75 mg for total 225 mg daily         Follow-up Information     Dedra Skeens, PA-C Follow up in 14 day(s).   Specialty: Orthopedic Surgery Why: Suture Removal. Contact information: [address] [place] [address] [phone]  Allergies  Allergen Reactions   Gabapentin Other (See Comments)    Weight gain   Ibuprofen Other (See Comments)    Headache     Subjective: pt reports not SOB or CP today, eager for discharge home    Discharge Exam: BP (!) 109/58 (BP Location: Left Arm)   Pulse 97   Temp 98.8 F (37.1 C)   Resp 20   Ht 5\' 3"  (1.6 m)   Wt 74.8 kg   SpO2 96%   BMI 29.23 kg/m  General: Pt is alert, awake, not in acute distress Cardiovascular: RRR, S1/S2 +, no rubs, no gallops Respiratory: CTA bilaterally, no wheezing, no rhonchi Abdominal: Soft, NT, ND, bowel sounds + Extremities: no edema, no cyanosis     The results of significant diagnostics from this hospitalization (including imaging, microbiology, ancillary and laboratory) are listed below for reference.     Microbiology: Recent Results (from the past 240 hour(s))  Surgical pcr screen     Status: Abnormal   Collection Time: 08/25/23  1:41 PM   Specimen: Nasal Mucosa; Nasal Swab  Result Value Ref Range Status   MRSA, PCR POSITIVE (A) NEGATIVE Final    Comment: RESULT CALLED TO, READ BACK BY AND VERIFIED WITH: BRYAN GRAY @1904  ON 08/25/23  SKL    Staphylococcus aureus POSITIVE (A) NEGATIVE Final    Comment: (NOTE) The Xpert SA Assay (FDA approved for NASAL specimens in patients 1 years of age and older), is one component of a comprehensive surveillance program. It is not intended to diagnose infection nor to guide or monitor treatment. Performed at [facility], [address] [place], [place] [address]      Labs: BNP (last 3 results) Recent Labs    08/31/23 1313  BNP 292.5*   Basic Metabolic Panel: Recent Labs  Lab 08/31/23 0536 08/31/23 1313 09/01/23 1112 09/02/23 0410  NA 140 138 139 138  K 4.4 3.8 3.7 3.7  CL 106 106 101 100  CO2 25 23 27 27   GLUCOSE 124* 110* 132* 128*  BUN 36* 39* 40* 42*  CREATININE 1.86* 1.98* 1.94* 1.90*  CALCIUM 8.2* 8.3* 8.3* 8.3*  MG  --   --   --  1.6*   Liver Function Tests: Recent Labs  Lab 08/31/23 1313  AST 69*  ALT 38  ALKPHOS 84  BILITOT 0.6  PROT 7.2  ALBUMIN 3.2*   No results for input(s): "LIPASE", "AMYLASE" in the last 168 hours. No results for input(s): "AMMONIA" in the last 168 hours. CBC: Recent Labs  Lab 08/31/23 0536 08/31/23 1305 09/01/23 0118 09/02/23 0410  WBC 12.2* 15.5* 17.7* 13.7*  NEUTROABS  --  12.3*  --   --   HGB 8.0* 8.8* 9.1* 9.0*  HCT 25.6* 28.5* 28.0* 28.4*  MCV 95.5 95.6 92.4 92.8  PLT 216 245 233 244   Cardiac Enzymes: No results for input(s): "CKTOTAL", "CKMB", "CKMBINDEX", "TROPONINI" in the last 168 hours. BNP: Invalid input(s): "POCBNP" CBG: No results for input(s): "GLUCAP" in the last 168 hours. D-Dimer Recent Labs    08/31/23 1535  DDIMER 1.23*   Hgb A1c No results for input(s): "HGBA1C" in the last 72 hours. Lipid Profile Recent Labs    09/01/23 1112  CHOL 112  HDL 58  LDLCALC 36  TRIG 89  CHOLHDL 1.9   Thyroid function studies No results for input(s): "TSH", "T4TOTAL", "T3FREE", "THYROIDAB" in the last 72 hours.  Invalid input(s): "FREET3" Anemia work up No results for input(s):  "VITAMINB12", "  FOLATE", "FERRITIN", "TIBC", "IRON", "RETICCTPCT" in the last 72 hours. Urinalysis    Component Value Date/Time   COLORURINE STRAW (A) 08/31/2023 1304   APPEARANCEUR CLEAR (A) 08/31/2023 1304   LABSPEC 1.008 08/31/2023 1304   PHURINE 5.0 08/31/2023 1304   GLUCOSEU NEGATIVE 08/31/2023 1304   HGBUR NEGATIVE 08/31/2023 1304   BILIRUBINUR NEGATIVE 08/31/2023 1304   KETONESUR NEGATIVE 08/31/2023 1304   PROTEINUR NEGATIVE 08/31/2023 1304   NITRITE NEGATIVE 08/31/2023 1304   LEUKOCYTESUR SMALL (A) 08/31/2023 1304   Sepsis Labs Recent Labs  Lab 08/31/23 0536 08/31/23 1305 09/01/23 0118 09/02/23 0410  WBC 12.2* 15.5* 17.7* 13.7*   Microbiology Recent Results (from the past 240 hour(s))  Surgical pcr screen     Status: Abnormal   Collection Time: 08/25/23  1:41 PM   Specimen: Nasal Mucosa; Nasal Swab  Result Value Ref Range Status   MRSA, PCR POSITIVE (A) NEGATIVE Final    Comment: RESULT CALLED TO, READ BACK BY AND VERIFIED WITH: BRYAN GRAY @1904  ON 08/25/23 SKL    Staphylococcus aureus POSITIVE (A) NEGATIVE Final    Comment: (NOTE) The Xpert SA Assay (FDA approved for NASAL specimens in patients 16 years of age and older), is one component of a comprehensive surveillance program. It is not intended to diagnose infection nor to guide or monitor treatment. Performed at [facility], [address] [place], [place] [address]    Imaging ECHOCARDIOGRAM COMPLETE  Result Date: 09/01/2023    ECHOCARDIOGRAM REPORT   Patient Name:   ROMELLE ILLE Date of Exam: 08/31/2023 Medical Rec #:  [id number]         Height:       63.0 in Accession #:    [id number]        Weight:       165.0 lb Date of Birth:  10/22/1951        BSA:          1.782 m Patient Age:    71 years          BP:           118/68 mmHg Patient Gender: F                 HR:           98 bpm. Exam Location:  [facility] Procedure: 2D Echo, Cardiac Doppler and Color Doppler Indications:     R06.00 Dyspnea   History:         Patient has prior history of Echocardiogram examinations, most                  recent 09/30/2021. CHF, Signs/Symptoms:Dyspnea; Risk                  Factors:Hypertension and Dyslipidemia. Obstructive sleep apnea.                  Chronic Kidney disease.  Sonographer:     Daphine Deutscher RDCS Referring Phys:  [id number] AMY N [facility] Diagnosing Phys: Julien Nordmann MD IMPRESSIONS  1. Left ventricular ejection fraction, by estimation, is 55 to 60%. The left ventricle has normal function. The left ventricle demonstrates regional wall motion abnormalities (see scoring diagram/findings for description). Left ventricular diastolic parameters are consistent with Grade I diastolic dysfunction (impaired relaxation).  2. Right ventricular systolic function is normal. The right ventricular size is normal. Tricuspid regurgitation signal is inadequate for assessing PA pressure.  3. Left atrial size was mildly dilated.  4. The  mitral valve is normal in structure. Mild mitral valve regurgitation. No evidence of mitral stenosis.  5. The aortic valve has an indeterminant number of cusps. Aortic valve regurgitation is not visualized. No aortic stenosis is present.  6. The inferior vena cava is normal in size with greater than 50% respiratory variability, suggesting right atrial pressure of 3 mmHg. FINDINGS  Left Ventricle: Left ventricular ejection fraction, by estimation, is 55 to 60%. The left ventricle has normal function. The left ventricle demonstrates regional wall motion abnormalities. The left ventricular internal cavity size was normal in size. There is no left ventricular hypertrophy. Left ventricular diastolic parameters are consistent with Grade I diastolic dysfunction (impaired relaxation). Right Ventricle: The right ventricular size is normal. No increase in right ventricular wall thickness. Right ventricular systolic function is normal. Tricuspid regurgitation signal is inadequate for assessing PA  pressure. Left Atrium: Left atrial size was mildly dilated. Right Atrium: Right atrial size was normal in size. Pericardium: There is no evidence of pericardial effusion. Mitral Valve: The mitral valve is normal in structure. Mild mitral valve regurgitation. No evidence of mitral valve stenosis. Tricuspid Valve: The tricuspid valve is normal in structure. Tricuspid valve regurgitation is not demonstrated. No evidence of tricuspid stenosis. Aortic Valve: The aortic valve has an indeterminant number of cusps. Aortic valve regurgitation is not visualized. No aortic stenosis is present. Pulmonic Valve: The pulmonic valve was normal in structure. Pulmonic valve regurgitation is not visualized. No evidence of pulmonic stenosis. Aorta: The aortic root is normal in size and structure. Venous: The inferior vena cava is normal in size with greater than 50% respiratory variability, suggesting right atrial pressure of 3 mmHg. IAS/Shunts: No atrial level shunt detected by color flow Doppler.  LEFT VENTRICLE PLAX 2D LVIDd:         4.20 cm Diastology LVIDs:         2.70 cm LV e' medial:    6.20 cm/s LV PW:         1.30 cm LV E/e' medial:  16.6 LV IVS:        0.80 cm LV e' lateral:   9.03 cm/s                        LV E/e' lateral: 11.4  RIGHT VENTRICLE RV Basal diam:  3.80 cm RV S prime:     19.13 cm/s TAPSE (M-mode): 3.8 cm LEFT ATRIUM             Index        RIGHT ATRIUM           Index LA diam:        4.80 cm 2.69 cm/m   RA Area:     11.00 cm LA Vol (A2C):   44.3 ml 24.86 ml/m  RA Volume:   26.70 ml  14.98 ml/m LA Vol (A4C):   72.9 ml 40.91 ml/m LA Biplane Vol: 58.1 ml 32.61 ml/m  AORTIC VALVE LVOT Vmax:   108.93 cm/s LVOT Vmean:  74.600 cm/s LVOT VTI:    0.188 m  AORTA Ao Root diam: 2.90 cm MITRAL VALVE MV Area (PHT): 4.68 cm     SHUNTS MV Decel Time: 162 msec     Systemic VTI: 0.19 m MV E velocity: 102.80 cm/s MV A velocity: 140.50 cm/s MV E/A ratio:  0.73 Julien Nordmann MD Electronically signed by Julien Nordmann MD  Signature Date/Time: 09/01/2023/12:17:55 PM    Final    NM Pulmonary  Perfusion  Result Date: 08/31/2023 CLINICAL DATA:  Positive D-dimer and known renal mass EXAM: NUCLEAR MEDICINE PERFUSION LUNG SCAN TECHNIQUE: Perfusion images were obtained in multiple projections after intravenous injection of radiopharmaceutical. Ventilation scans intentionally deferred if perfusion scan and chest x-ray adequate for interpretation during COVID 19 epidemic. RADIOPHARMACEUTICALS:  4.17 mCi Tc-59m MAA IV COMPARISON:  09/29/2021 FINDINGS: Adequate uptake is noted throughout both lungs. No wedge-shaped defect to suggest pulmonary embolism is noted. The overall appearance is similar to that seen on the prior exam. IMPRESSION: No evidence of pulmonary emboli. Electronically Signed   By: Alcide Clever M.D.   On: 08/31/2023 20:01   US Venous Img Lower Bilateral (DVT)  Result Date: 08/31/2023 CLINICAL DATA:  Bilateral lower extremity pain and swelling EXAM: BILATERAL LOWER EXTREMITY VENOUS DOPPLER ULTRASOUND TECHNIQUE: Gray-scale sonography with graded compression, as well as color Doppler and duplex ultrasound were performed to evaluate the lower extremity deep venous systems from the level of the common femoral vein and including the common femoral, femoral, profunda femoral, popliteal and calf veins including the posterior tibial, peroneal and gastrocnemius veins when visible. The superficial great saphenous vein was also interrogated. Spectral Doppler was utilized to evaluate flow at rest and with distal augmentation maneuvers in the common femoral, femoral and popliteal veins. COMPARISON:  None Available. FINDINGS: RIGHT LOWER EXTREMITY Common Femoral Vein: No evidence of thrombus. Normal compressibility, respiratory phasicity and response to augmentation. Saphenofemoral Junction: No evidence of thrombus. Normal compressibility and flow on color Doppler imaging. Profunda Femoral Vein: No evidence of thrombus. Normal  compressibility and flow on color Doppler imaging. Femoral Vein: No evidence of thrombus. Normal compressibility, respiratory phasicity and response to augmentation. Popliteal Vein: No evidence of thrombus. Normal compressibility, respiratory phasicity and response to augmentation. Calf Veins: No evidence of thrombus. Normal compressibility and flow on color Doppler imaging. Superficial Great Saphenous Vein: No evidence of thrombus. Normal compressibility. Venous Reflux:  None. Other Findings:  None. LEFT LOWER EXTREMITY Common Femoral Vein: No evidence of thrombus. Normal compressibility, respiratory phasicity and response to augmentation. Saphenofemoral Junction: No evidence of thrombus. Normal compressibility and flow on color Doppler imaging. Profunda Femoral Vein: No evidence of thrombus. Normal compressibility and flow on color Doppler imaging. Femoral Vein: No evidence of thrombus. Normal compressibility, respiratory phasicity and response to augmentation. Popliteal Vein: No evidence of thrombus. Normal compressibility, respiratory phasicity and response to augmentation. Calf Veins: No evidence of thrombus. Normal compressibility and flow on color Doppler imaging. Superficial Great Saphenous Vein: No evidence of thrombus. Normal compressibility. Venous Reflux:  None. Other Findings:  None. IMPRESSION: No evidence of deep venous thrombosis in either lower extremity. Electronically Signed   By: Alcide Clever M.D.   On: 08/31/2023 19:59   DG Chest Portable 1 View  Result Date: 08/31/2023 CLINICAL DATA:  hypoxia, chest pain EXAM: PORTABLE CHEST 1 VIEW COMPARISON:  June 15th 2024, October 12, 2021 FINDINGS: The cardiomediastinal silhouette is unchanged and enlarged in contour.Low lung volume radiograph. Elevation of the RIGHT hemidiaphragm, increased in comparison to June 2024. No pleural effusion. No pneumothorax. Patchy bilateral reticulonodular and airspace opacities. Status post RIGHT shoulder arthroplasty  IMPRESSION: 1. Patchy bilateral reticulonodular and airspace opacities. Differential considerations include pulmonary edema versus aspiration, less likely infection. 2. Increased elevation of the RIGHT hemidiaphragm since June 2015, possibly due to technique. Consider PA and lateral chest radiograph when clinically appropriate. Electronically Signed   By: Meda Klinefelter M.D.   On: 08/31/2023 12:54      Time coordinating discharge:  over 30 minutes  SIGNED:  Sunnie Nielsen DO [facility]

## 2023-09-02 NOTE — TOC Transition Note (Signed)
Transition of Care [facility]) - CM/SW Discharge Note   Patient Details  Name: SHIREE DUNFORD MRN: [id number] Date of Birth: 1951-07-16  Transition of Care [facility]) CM/SW Contact:  Truddie Hidden, RN Phone Number: 09/02/2023, 2:38 PM   Clinical Narrative:    [place] from [place] notified of discharge home home today.  Patient stated her transportation is at bedside to take her home.   TOC signing off.      Barriers to Discharge: Continued Medical Work up   Patient Goals and CMS Choice CMS Medicare.gov Compare Post Acute Care list provided to:: Patient Choice offered to / list presented to : Patient  Discharge Placement                         Discharge Plan and Services Additional resources added to the After Visit Summary for       Post Acute Care Choice: Home Health                    HH Arranged: PT [facility] Agency: [facility] Date [facility] Agency Contacted: 09/02/23 Time HH Agency Contacted: 1007 Representative spoke with at [facility] Agency: [place]  Social Determinants of Health (SDOH) Interventions SDOH Screenings   Food Insecurity: No Food Insecurity (08/30/2023)  Housing: Low Risk  (08/30/2023)  Transportation Needs: No Transportation Needs (08/30/2023)  Utilities: Not At Risk (08/30/2023)  Depression (PHQ2-9): Low Risk  (09/02/2022)  Tobacco Use: Low Risk  (08/31/2023)     Readmission Risk Interventions    09/02/2023   10:04 AM  Readmission Risk Prevention Plan  Transportation Screening Complete  PCP or Specialist Appt within 3-5 Days Complete  HRI or Home Care Consult Complete  Social Work Consult for Recovery Care Planning/Counseling Complete  Palliative Care Screening Not Applicable  Medication Review [employer]) Complete

## 2023-09-02 NOTE — Progress Notes (Signed)
CROSS COVER NOTE  NAME: Tonya Cummings MRN: [id number] DOB : [birth year]    Concern as stated by nurse / staff   Reported by CCMD per nurse, 2 SVT runs.  Asymptomatic    Pertinent findings on chart review: Admitted with acute resp failure and elevated troponin.  Leukocytosis also present and treatment for possible pneumonia was started.   Assessment and  Interventions   Assessment:    09/02/2023    1:29 AM 09/01/2023   11:29 PM 09/01/2023    7:13 PM  Vitals with BMI  Systolic 134 144 102  Diastolic 74 76 62  Pulse  102 96       Latest Ref Rng & Units 09/01/2023   11:12 AM 08/31/2023    1:13 PM 08/31/2023    5:36 AM  BMP  Glucose 70 - 99 mg/dL 725  366  440   BUN 8 - 23 mg/dL 40  39  36   Creatinine 0.44 - 1.00 mg/dL 3.47  4.25  9.56   Sodium 135 - 145 mmol/L 139  138  140   Potassium 3.5 - 5.1 mmol/L 3.7  3.8  4.4   Chloride 98 - 111 mmol/L 101  106  106   CO2 22 - 32 mmol/L 27  23  25    Calcium 8.9 - 10.3 mg/dL 8.3  8.3  8.2     Plan: Mag level added to labs       [name] NP [facility] Cross Cover 7pm-7am - check amion for availability Pager [phone]

## 2023-09-02 NOTE — Progress Notes (Addendum)
CCMD called and reported that pt just have an episode of SVT HR up to 150-170's. On assessmen tpt was asleep. VSS. Pt has no complaints of pain or Shortness of breath at this time. NP Jon Billings made aware. Will continue to monitor.  Update 0144: CCMD called again for another run of SVT. NP Jon Billings made aware. Will continue.  Update 0146: No new order. Will continue to monitor.  Update 0420: CCMD called and and reported that pt was in afib HR 140's-150's. Pt is asymptomatic at this time. Will continue to monitor.

## 2023-09-02 NOTE — Plan of Care (Signed)
Care plan complete

## 2023-09-02 NOTE — Evaluation (Signed)
Physical Therapy Evaluation Patient Details Name: Tonya Cummings MRN: [id number] DOB: 01-04-1951 Today's Date: 09/02/2023  History of Present Illness  Pt is a 72 yo F diagnosed with R shoulder rotator cuff arthropathy and is s/p R reverse total shoulder arthroplasty and biceps tenodesis.  PMH includes: CAD, CHF, hiatal hernia, anxiety, depression, GERD, renal disease, arthritis, CKD, HTN, and rheumatoid arthritis.  Clinical Impression   Pt seen by PT for a re-evaluation secondary to status change. Pt is received in bed, she is agreeable to PT session. Pt is in R side ABD shoulder splint s/p R reverse total shoulder arthroplasty and biceps tenodesis. Pt performs bed mobility and transfers sup A, and amb CGA using SPC for safety. Pt able to amb approx 200 ft using SPC on L side to promote PLOF. No reports of dizziness during and following mobility. SpO2 95% RA following amb activity. Will discharge Pt from PT services at this time following Pt at baseline.      If plan is discharge home, recommend the following: Help with stairs or ramp for entrance;Assist for transportation;Assistance with cooking/housework;A little help with walking and/or transfers;A lot of help with bathing/dressing/bathroom   Can travel by private vehicle        Equipment Recommendations None recommended by PT  Recommendations for Other Services       Functional Status Assessment Patient has not had a recent decline in their functional status     Precautions / Restrictions Precautions Precautions: Fall;Shoulder Shoulder Interventions: Shoulder abduction pillow;Don joy ultra sling;Off for dressing/bathing/exercises Restrictions Weight Bearing Restrictions: Yes RUE Weight Bearing: Non weight bearing      Mobility  Bed Mobility Overal bed mobility: Modified Independent Bed Mobility: Supine to Sit, Sit to Supine     Supine to sit: Supervision, HOB elevated, Used rails Sit to supine: Supervision, Used  rails   General bed mobility comments: No cuing required throughout bed mobility    Transfers Overall transfer level: Needs assistance Equipment used: Straight cane Transfers: Sit to/from Stand Sit to Stand: Supervision           General transfer comment: Able to perform transfers with close sup and no cuing required    Ambulation/Gait Ambulation/Gait assistance: Contact guard assist Gait Distance (Feet): 200 Feet Assistive device: Straight cane Gait Pattern/deviations: Step-through pattern, Wide base of support, Decreased step length - right, Decreased step length - left, Decreased stride length, WFL(Within Functional Limits) Gait velocity: WNL     General Gait Details: Able to amb with SPC on L side; no cuing for AD management  Stairs            Wheelchair Mobility     Tilt Bed    Modified Rankin (Stroke Patients Only)       Balance Overall balance assessment: Independent Sitting-balance support: No upper extremity supported, Feet supported Sitting balance-Leahy Scale: Good Sitting balance - Comments: Able to maintain static and dynamic seating balance during functional activities   Standing balance support: Single extremity supported, During functional activity Standing balance-Leahy Scale: Good Standing balance comment: Able to maintain static and dynamic standing balance during functional activities                             Pertinent Vitals/Pain Pain Assessment Pain Assessment: 0-10 Pain Score: 2  Pain Location: R shoulder Pain Descriptors / Indicators: Operative site guarding, Sore, Dull Pain Intervention(s): Monitored during session    Home Living Family/patient expects to  be discharged to:: Private residence Living Arrangements: Spouse/significant other;Other relatives;Children Available Help at Discharge: Family;Available 24 hours/day Type of Home: Mobile home Home Access: Stairs to enter Entrance Stairs-Rails: Can reach  both;Right;Left Entrance Stairs-Number of Steps: 3   Home Layout: One level Home Equipment: Agricultural consultant (2 wheels);Cane - single point;BSC/3in1      Prior Function Prior Level of Function : Independent/Modified Independent             Mobility Comments: Uses SPC at baseline, limited community ambulator ADLs Comments: Independent     Extremity/Trunk Assessment   Upper Extremity Assessment Upper Extremity Assessment: Overall WFL for tasks assessed    Lower Extremity Assessment Lower Extremity Assessment: Overall WFL for tasks assessed    Cervical / Trunk Assessment Cervical / Trunk Assessment: Other exceptions (L lateral bend contracture) Cervical / Trunk Exceptions: Pt reports L head tilt normal, pending next surgery  Communication   Communication Communication: No apparent difficulties Cueing Techniques: Verbal cues  Cognition Arousal: Alert Behavior During Therapy: WFL for tasks assessed/performed Overall Cognitive Status: Within Functional Limits for tasks assessed                                 General Comments: AO x4; Pleasant and eager to work with PT        General Comments      Exercises     Assessment/Plan    PT Assessment Patient does not need any further PT services  PT Problem List         PT Treatment Interventions DME instruction;Balance training;Gait training;Stair training;Functional mobility training;Therapeutic activities;Therapeutic exercise    PT Goals (Current goals can be found in the Care Plan section)  Acute Rehab PT Goals Patient Stated Goal: to be able walk and do everything like before PT Goal Formulation: With patient Time For Goal Achievement: 09/02/23 Potential to Achieve Goals: Good    Frequency Min 1X/week     Co-evaluation               AM-PAC PT "6 Clicks" Mobility  Outcome Measure Help needed turning from your back to your side while in a flat bed without using bedrails?: None Help  needed moving from lying on your back to sitting on the side of a flat bed without using bedrails?: None Help needed moving to and from a bed to a chair (including a wheelchair)?: None Help needed standing up from a chair using your arms (e.g., wheelchair or bedside chair)?: None Help needed to walk in hospital room?: None Help needed climbing 3-5 steps with a railing? : A Little 6 Click Score: 23    End of Session Equipment Utilized During Treatment: Gait belt Activity Tolerance: Patient tolerated treatment well Patient left: in bed;with bed alarm set;with call bell/phone within reach Nurse Communication: Mobility status      Time: 2841-3244 PT Time Calculation (min) (ACUTE ONLY): 9 min   Charges:   PT Evaluation $PT Re-evaluation: 1 Re-eval   PT General Charges $$ ACUTE PT VISIT: 1 Visit         Elmon Else, SPT   Simpson Paulos 09/02/2023, 1:49 PM

## 2023-09-02 NOTE — Plan of Care (Signed)
  Problem: Education: Goal: Knowledge of the prescribed therapeutic regimen will improve 09/02/2023 0150 by Myles Gip, RN Outcome: Progressing 09/02/2023 0149 by Myles Gip, RN Outcome: Progressing   Problem: Education: Goal: Understanding of activity limitations/precautions following surgery will improve 09/02/2023 0150 by Myles Gip, RN Outcome: Progressing 09/02/2023 0150 by Myles Gip, RN Outcome: Progressing 09/02/2023 0150 by Myles Gip, RN Outcome: Progressing   Problem: Activity: Goal: Ability to tolerate increased activity will improve 09/02/2023 0150 by Myles Gip, RN Outcome: Progressing 09/02/2023 0149 by Myles Gip, RN Outcome: Progressing   Problem: Pain Management: Goal: Pain level will decrease with appropriate interventions 09/02/2023 0150 by Myles Gip, RN Outcome: Progressing 09/02/2023 0149 by Myles Gip, RN Outcome: Progressing   Problem: Activity: Goal: Ability to tolerate increased activity will improve Outcome: Progressing

## 2023-09-05 DIAGNOSIS — Z96611 Presence of right artificial shoulder joint: Secondary | ICD-10-CM | POA: Diagnosis not present

## 2023-09-05 DIAGNOSIS — M25611 Stiffness of right shoulder, not elsewhere classified: Secondary | ICD-10-CM | POA: Diagnosis not present

## 2023-09-05 DIAGNOSIS — M6281 Muscle weakness (generalized): Secondary | ICD-10-CM | POA: Diagnosis not present

## 2023-09-05 DIAGNOSIS — M25511 Pain in right shoulder: Secondary | ICD-10-CM | POA: Diagnosis not present

## 2023-09-09 LAB — SURGICAL PATHOLOGY

## 2023-09-12 ENCOUNTER — Encounter: Payer: Self-pay | Admitting: Cardiology

## 2023-09-12 ENCOUNTER — Ambulatory Visit: Payer: Medicare HMO | Attending: Cardiology | Admitting: Cardiology

## 2023-09-12 VITALS — BP 142/70 | HR 89 | Wt 162.6 lb

## 2023-09-12 DIAGNOSIS — G8929 Other chronic pain: Secondary | ICD-10-CM | POA: Diagnosis not present

## 2023-09-12 DIAGNOSIS — N184 Chronic kidney disease, stage 4 (severe): Secondary | ICD-10-CM

## 2023-09-12 DIAGNOSIS — M12811 Other specific arthropathies, not elsewhere classified, right shoulder: Secondary | ICD-10-CM | POA: Diagnosis not present

## 2023-09-12 DIAGNOSIS — M549 Dorsalgia, unspecified: Secondary | ICD-10-CM | POA: Diagnosis not present

## 2023-09-12 DIAGNOSIS — I5031 Acute diastolic (congestive) heart failure: Secondary | ICD-10-CM | POA: Diagnosis not present

## 2023-09-12 DIAGNOSIS — I5032 Chronic diastolic (congestive) heart failure: Secondary | ICD-10-CM | POA: Diagnosis not present

## 2023-09-12 DIAGNOSIS — I10 Essential (primary) hypertension: Secondary | ICD-10-CM | POA: Diagnosis not present

## 2023-09-12 MED ORDER — ENALAPRIL-HYDROCHLOROTHIAZIDE 10-25 MG PO TABS: 1.0000 | ORAL_TABLET | Freq: Every day | ORAL | 3 refills | Status: DC

## 2023-09-12 NOTE — Patient Instructions (Signed)
Medication Instructions:  Your physician recommends that you continue on your current medications as directed. Please refer to the Current Medication list given to you today.  *If you need a refill on your cardiac medications before your next appointment, please call your pharmacy*  Lab Work: Your provider would like for you to have following labs drawn today BMET.   If you have labs (blood work) drawn today and your tests are completely normal, you will receive your results only by: MyChart Message (if you have MyChart) OR A paper copy in the mail If you have any lab test that is abnormal or we need to change your treatment, we will call you to review the results.  Testing/Procedures: -None ordered  Follow-Up: At [facility], you and your health needs are our priority.  As part of our continuing mission to provide you with exceptional heart care, we have created designated Provider Care Teams.  These Care Teams include your primary Cardiologist (physician) and Advanced Practice Providers (APPs -  Physician Assistants and Nurse Practitioners) who all work together to provide you with the care you need, when you need it.  Your next appointment:   3 month(s)  Provider:   You may see Julien Nordmann, MD or one of the following Advanced Practice Providers on your designated Care Team:   Nicolasa Ducking, NP Eula Listen, PA-C Cadence Fransico Michael, PA-C Charlsie Quest, NP    Other Instructions -None

## 2023-09-12 NOTE — Progress Notes (Signed)
Cardiology Office Note:  .   Date:  09/12/2023  ID:  Tonya Cummings, DOB 16-Jun-1951, MRN [id number] PCP: Myrene Buddy, NP  [facility] Providers Cardiologist:  Julien Nordmann, MD    History of Present Illness: .   Tonya Cummings is a 72 y.o. female with a past medical history of chronic diastolic congestive heart failure, anemia, obesity, CKD, chronic back pain, who is here today for follow-up after recent hospitalization.  Previously hospitalized 06/2019 for shortness of breath, diastolic CHF, leg swelling.  She was hypertensive on arrival.  Ended up being treated with IV Lasix, VQ scan was negative for PE.  Echocardiogram revealed LVEF of 50 to 55%, G1 DD, no regional wall motion abnormalities.  She continued to be followed by pulmonary congestive heart failure thereafter.  Repeat echocardiogram completed in 09/2021 revealed an LVEF of 55 to 60%, no RWMA, G1 DD, and mild MR.  She was last seen in clinic 09/10/2023 had been doing well from a cardiac perspective.  She did continue to follow with nephrology for CKD stage IV.  She was having unstable gait and falling today issues with her neck.  She was to have upcoming surgery with a neurosurgeon for severe neck pain with associated symptoms of numbness and tingling to the bilateral upper extremities.  On 08/30/2023 she was direct admitted under orthopedic service for right shoulder for repair, injury requiring RT reverse shoulder arthroplasty and right biceps tenodesis.  On postop day 1 while walking with physical therapy her oxygen saturations dropped to the mid 80s.  Hospitalist were consulted at that time.  Vital signs remained stable and she was maintaining oxygen saturations of 99% on 2 L of O2 via nasal cannula.  Chest x-ray read as patchy bilateral reticulonodular airspace opacity differential include pulmonary edema versus aspiration.  WBCs of 12.12, hemoglobin of 8, serum creatinine 1.86 with a GFR 29.  Echocardiogram was  ordered.  Postop day 2 echo revealed LVEF 55 to 60%, G1 DD.  Troponin was up to 171 was continued to be trended and she was started on IV heparin infusion.  They attempted to wean her off of oxygen and placed her on IV Lasix.  Postop day #3 her symptoms had improved with Lasix her troponins remained flat so they suspected that there was demand ischemia versus an NSTEMI.  Heparin infusion was stopped and she was able to be discharged.  She returns to clinic today with continued pain to the right shoulder postoperatively.  She states that she is undergoing physical therapy once since she had discharged from the hospital and tolerated it well.  Denies any chest pain, chest tightness, or palpitations, shortness of breath, lightheadedness or dizziness.  Continues to have shoulder pain neck pain and swelling into her bilateral lower extremities with the left being greater than the right.  She has done well with being on diuretics since her hospitalization.  States that she has been compliant with her medications.  She is concerned with being told she potentially had a heart attack or heart failure during her recent hospitalization postoperatively and has questions today.  ROS: 10 point review of systems has been reviewed and considered negative with exception of what is been listed in the HPI  Studies Reviewed: Marland Kitchen       TTE 08/31/23 1. Left ventricular ejection fraction, by estimation, is 55 to 60%. The  left ventricle has normal function. The left ventricle demonstrates  regional wall motion abnormalities (see scoring diagram/findings for  description). Left ventricular diastolic  parameters are consistent with Grade I diastolic dysfunction (impaired  relaxation).   2. Right ventricular systolic function is normal. The right ventricular  size is normal. Tricuspid regurgitation signal is inadequate for assessing  PA pressure.   3. Left atrial size was mildly dilated.   4. The mitral valve is normal in  structure. Mild mitral valve  regurgitation. No evidence of mitral stenosis.   5. The aortic valve has an indeterminant number of cusps. Aortic valve  regurgitation is not visualized. No aortic stenosis is present.   6. The inferior vena cava is normal in size with greater than 50%  respiratory variability, suggesting right atrial pressure of 3 mmHg.   TTE 09/30/21  1. Left ventricular ejection fraction, by estimation, is 55 to 60%. The  left ventricle has normal function. The left ventricle has no regional  wall motion abnormalities. Left ventricular diastolic parameters are  consistent with Grade I diastolic  dysfunction (impaired relaxation).   2. Right ventricular systolic function is normal. The right ventricular  size is normal.   3. The mitral valve is normal in structure. Mild mitral valve  regurgitation.   4. The aortic valve is normal in structure. Aortic valve regurgitation is  not visualized.  Risk Assessment/Calculations:          Physical Exam:   VS:  BP (!) 142/70 (BP Location: Left Arm, Patient Position: Sitting, Cuff Size: Normal)   Pulse 89   Wt 162 lb 9.6 oz (73.8 kg)   SpO2 94%   BMI 28.80 kg/m    Wt Readings from Last 3 Encounters:  09/12/23 162 lb 9.6 oz (73.8 kg)  08/30/23 165 lb (74.8 kg)  08/25/23 161 lb 9.6 oz (73.3 kg)    GEN: Well nourished, well developed in no acute distress NECK: No JVD; No carotid bruits CARDIAC: RRR, no murmurs, rubs, gallops RESPIRATORY:  Clear to auscultation without rales, wheezing or rhonchi  ABDOMEN: Soft, non-tender, non-distended EXTREMITIES:  No edema; No deformity, right arm and shoulder remain in a sling postoperatively  ASSESSMENT AND PLAN: .   Chronic diastolic congestive heart failure without symptoms today.  Had an episode of fluid overload postoperatively during recent hospitalization.  Echocardiogram revealed an LVEF of 55 to 60%, G1 DD, mild mitral valve regurgitation.  Patient appears to be euvolemic on  exam today.  She is continued on furosemide and spironolactone.  She is also being sent for follow-up BMP today.  Encouraged to weigh herself daily and limit her sodium intake.  Unfortunately she is not a good candidate for SGLT2 inhibitors due to her kidney function.  Primary hypertension blood pressure today 142/70 which is likely driven from pain as patient states that she is not pain this morning.  She is continued on her current medication regimen.  She has been encouraged to continue to monitor blood pressure 1 to 2 hours postmedication administration.  CKD stage III-IV with last serum creatinine of 1.90 and a BUN of 42.  States she continues to follow with nephrology.  Encouraged to avoid use of NSAIDs.  Chronic back pain with severe neck pain associated symptoms of numbness and tingling to her bilateral upper extremities which she continues to follow with neurosurgery.  Status post right shoulder rotator cuff repair that continues to be followed by orthopedics.        Dispo: Patient to return to clinic to see MD/APP in 3 months or sooner if needed  Signed, Jesi Jurgens, NP

## 2023-09-13 LAB — BASIC METABOLIC PANEL
BUN/Creatinine Ratio: 28 (ref 12–28)
BUN: 52 mg/dL — ABNORMAL HIGH (ref 8–27)
CO2: 23 mmol/L (ref 20–29)
Calcium: 9.5 mg/dL (ref 8.7–10.3)
Chloride: 102 mmol/L (ref 96–106)
Creatinine, Ser: 1.87 mg/dL — ABNORMAL HIGH (ref 0.57–1.00)
Glucose: 104 mg/dL — ABNORMAL HIGH (ref 70–99)
Potassium: 4.4 mmol/L (ref 3.5–5.2)
Sodium: 142 mmol/L (ref 134–144)
eGFR: 29 mL/min/{1.73_m2} — ABNORMAL LOW (ref >59)

## 2023-09-13 NOTE — Progress Notes (Signed)
Kidney function remains stable at baseline. Continue current medication regimen.

## 2023-09-14 ENCOUNTER — Other Ambulatory Visit: Payer: Self-pay

## 2023-09-14 DIAGNOSIS — M25611 Stiffness of right shoulder, not elsewhere classified: Secondary | ICD-10-CM | POA: Diagnosis not present

## 2023-09-14 DIAGNOSIS — E538 Deficiency of other specified B group vitamins: Secondary | ICD-10-CM

## 2023-09-14 DIAGNOSIS — Z96611 Presence of right artificial shoulder joint: Secondary | ICD-10-CM | POA: Diagnosis not present

## 2023-09-14 DIAGNOSIS — S46001A Unspecified injury of muscle(s) and tendon(s) of the rotator cuff of right shoulder, initial encounter: Secondary | ICD-10-CM | POA: Diagnosis not present

## 2023-09-14 DIAGNOSIS — M25511 Pain in right shoulder: Secondary | ICD-10-CM | POA: Diagnosis not present

## 2023-09-14 DIAGNOSIS — D631 Anemia in chronic kidney disease: Secondary | ICD-10-CM

## 2023-09-14 DIAGNOSIS — M6281 Muscle weakness (generalized): Secondary | ICD-10-CM | POA: Diagnosis not present

## 2023-09-15 ENCOUNTER — Encounter: Payer: Self-pay | Admitting: Orthopedic Surgery

## 2023-09-16 ENCOUNTER — Inpatient Hospital Stay: Payer: Medicare HMO | Attending: Oncology

## 2023-09-16 DIAGNOSIS — E538 Deficiency of other specified B group vitamins: Secondary | ICD-10-CM | POA: Diagnosis not present

## 2023-09-16 DIAGNOSIS — D631 Anemia in chronic kidney disease: Secondary | ICD-10-CM | POA: Insufficient documentation

## 2023-09-16 DIAGNOSIS — E611 Iron deficiency: Secondary | ICD-10-CM | POA: Insufficient documentation

## 2023-09-16 DIAGNOSIS — N184 Chronic kidney disease, stage 4 (severe): Secondary | ICD-10-CM | POA: Diagnosis not present

## 2023-09-16 DIAGNOSIS — Z79899 Other long term (current) drug therapy: Secondary | ICD-10-CM | POA: Diagnosis not present

## 2023-09-16 LAB — CBC WITH DIFFERENTIAL (CANCER CENTER ONLY)
Abs Immature Granulocytes: 0.13 10*3/uL — ABNORMAL HIGH (ref 0.00–0.07)
Basophils Absolute: 0.1 10*3/uL (ref 0.0–0.1)
Basophils Relative: 1 %
Eosinophils Absolute: 0.2 10*3/uL (ref 0.0–0.5)
Eosinophils Relative: 2 %
HCT: 25.5 % — ABNORMAL LOW (ref 36.0–46.0)
Hemoglobin: 7.9 g/dL — ABNORMAL LOW (ref 12.0–15.0)
Immature Granulocytes: 1 %
Lymphocytes Relative: 24 %
Lymphs Abs: 2.5 10*3/uL (ref 0.7–4.0)
MCH: 29.7 pg (ref 26.0–34.0)
MCHC: 31 g/dL (ref 30.0–36.0)
MCV: 95.9 fL (ref 80.0–100.0)
Monocytes Absolute: 1.1 10*3/uL — ABNORMAL HIGH (ref 0.1–1.0)
Monocytes Relative: 11 %
Neutro Abs: 6.4 10*3/uL (ref 1.7–7.7)
Neutrophils Relative %: 61 %
Platelet Count: 399 10*3/uL (ref 150–400)
RBC: 2.66 MIL/uL — ABNORMAL LOW (ref 3.87–5.11)
RDW: 15.9 % — ABNORMAL HIGH (ref 11.5–15.5)
WBC Count: 10.4 10*3/uL (ref 4.0–10.5)
nRBC: 0 % (ref 0.0–0.2)

## 2023-09-16 LAB — IRON AND TIBC
Iron: 38 ug/dL (ref 28–170)
Saturation Ratios: 14 % (ref 10.4–31.8)
TIBC: 273 ug/dL (ref 250–450)
UIBC: 235 ug/dL

## 2023-09-16 LAB — FERRITIN: Ferritin: 159 ng/mL (ref 11–307)

## 2023-09-20 DIAGNOSIS — Z96611 Presence of right artificial shoulder joint: Secondary | ICD-10-CM | POA: Diagnosis not present

## 2023-09-20 DIAGNOSIS — M6281 Muscle weakness (generalized): Secondary | ICD-10-CM | POA: Diagnosis not present

## 2023-09-20 DIAGNOSIS — M25611 Stiffness of right shoulder, not elsewhere classified: Secondary | ICD-10-CM | POA: Diagnosis not present

## 2023-09-20 DIAGNOSIS — M25511 Pain in right shoulder: Secondary | ICD-10-CM | POA: Diagnosis not present

## 2023-09-26 DIAGNOSIS — N184 Chronic kidney disease, stage 4 (severe): Secondary | ICD-10-CM | POA: Diagnosis not present

## 2023-09-26 DIAGNOSIS — N2581 Secondary hyperparathyroidism of renal origin: Secondary | ICD-10-CM | POA: Diagnosis not present

## 2023-09-26 DIAGNOSIS — R809 Proteinuria, unspecified: Secondary | ICD-10-CM | POA: Diagnosis not present

## 2023-09-26 DIAGNOSIS — D631 Anemia in chronic kidney disease: Secondary | ICD-10-CM | POA: Diagnosis not present

## 2023-09-26 DIAGNOSIS — I10 Essential (primary) hypertension: Secondary | ICD-10-CM | POA: Diagnosis not present

## 2023-09-28 DIAGNOSIS — M25511 Pain in right shoulder: Secondary | ICD-10-CM | POA: Diagnosis not present

## 2023-09-29 ENCOUNTER — Other Ambulatory Visit: Payer: Self-pay | Admitting: Orthopedic Surgery

## 2023-09-29 DIAGNOSIS — M9731XA Periprosthetic fracture around internal prosthetic right shoulder joint, initial encounter: Secondary | ICD-10-CM | POA: Diagnosis not present

## 2023-10-02 NOTE — Plan of Care (Signed)
CHL Tonsillectomy/Adenoidectomy, Postoperative PEDS care plan entered in error.

## 2023-10-03 ENCOUNTER — Inpatient Hospital Stay: Payer: Medicare HMO

## 2023-10-03 ENCOUNTER — Encounter: Payer: Self-pay | Admitting: Orthopedic Surgery

## 2023-10-03 ENCOUNTER — Inpatient Hospital Stay
Admission: RE | Admit: 2023-10-03 | Discharge: 2023-10-04 | DRG: 483 | Disposition: A | Discharge status: Home / Self Care | Payer: Medicare HMO | Attending: Orthopedic Surgery | Admitting: Orthopedic Surgery

## 2023-10-03 ENCOUNTER — Other Ambulatory Visit: Payer: Self-pay

## 2023-10-03 ENCOUNTER — Encounter
Admission: RE | Disposition: A | Discharge status: Home / Self Care | Payer: Self-pay | Source: Home / Self Care | Attending: Orthopedic Surgery

## 2023-10-03 DIAGNOSIS — T84038A Mechanical loosening of other internal prosthetic joint, initial encounter: Principal | ICD-10-CM | POA: Diagnosis present

## 2023-10-03 DIAGNOSIS — M9731XA Periprosthetic fracture around internal prosthetic right shoulder joint, initial encounter: Principal | ICD-10-CM

## 2023-10-03 DIAGNOSIS — F32A Depression, unspecified: Secondary | ICD-10-CM | POA: Diagnosis present

## 2023-10-03 DIAGNOSIS — D509 Iron deficiency anemia, unspecified: Secondary | ICD-10-CM | POA: Diagnosis present

## 2023-10-03 DIAGNOSIS — J8410 Pulmonary fibrosis, unspecified: Secondary | ICD-10-CM | POA: Diagnosis present

## 2023-10-03 DIAGNOSIS — F419 Anxiety disorder, unspecified: Secondary | ICD-10-CM | POA: Diagnosis present

## 2023-10-03 DIAGNOSIS — I509 Heart failure, unspecified: Secondary | ICD-10-CM | POA: Diagnosis not present

## 2023-10-03 DIAGNOSIS — G8929 Other chronic pain: Secondary | ICD-10-CM | POA: Diagnosis present

## 2023-10-03 DIAGNOSIS — G4733 Obstructive sleep apnea (adult) (pediatric): Secondary | ICD-10-CM | POA: Diagnosis present

## 2023-10-03 DIAGNOSIS — N184 Chronic kidney disease, stage 4 (severe): Secondary | ICD-10-CM | POA: Diagnosis not present

## 2023-10-03 DIAGNOSIS — Y828 Other medical devices associated with adverse incidents: Secondary | ICD-10-CM | POA: Diagnosis present

## 2023-10-03 DIAGNOSIS — S42201A Unspecified fracture of upper end of right humerus, initial encounter for closed fracture: Secondary | ICD-10-CM | POA: Diagnosis not present

## 2023-10-03 DIAGNOSIS — G2581 Restless legs syndrome: Secondary | ICD-10-CM | POA: Diagnosis present

## 2023-10-03 DIAGNOSIS — M86621 Other chronic osteomyelitis, right  humerus: Secondary | ICD-10-CM | POA: Diagnosis not present

## 2023-10-03 DIAGNOSIS — G43909 Migraine, unspecified, not intractable, without status migrainosus: Secondary | ICD-10-CM | POA: Diagnosis present

## 2023-10-03 DIAGNOSIS — I7300 Raynaud's syndrome without gangrene: Secondary | ICD-10-CM | POA: Diagnosis present

## 2023-10-03 DIAGNOSIS — I130 Hypertensive heart and chronic kidney disease with heart failure and stage 1 through stage 4 chronic kidney disease, or unspecified chronic kidney disease: Secondary | ICD-10-CM | POA: Diagnosis not present

## 2023-10-03 DIAGNOSIS — Z96611 Presence of right artificial shoulder joint: Secondary | ICD-10-CM | POA: Diagnosis not present

## 2023-10-03 DIAGNOSIS — M810 Age-related osteoporosis without current pathological fracture: Secondary | ICD-10-CM | POA: Diagnosis present

## 2023-10-03 DIAGNOSIS — D62 Acute posthemorrhagic anemia: Secondary | ICD-10-CM | POA: Diagnosis not present

## 2023-10-03 DIAGNOSIS — D631 Anemia in chronic kidney disease: Secondary | ICD-10-CM | POA: Diagnosis not present

## 2023-10-03 DIAGNOSIS — I5032 Chronic diastolic (congestive) heart failure: Secondary | ICD-10-CM | POA: Diagnosis not present

## 2023-10-03 DIAGNOSIS — M069 Rheumatoid arthritis, unspecified: Secondary | ICD-10-CM | POA: Diagnosis present

## 2023-10-03 DIAGNOSIS — K219 Gastro-esophageal reflux disease without esophagitis: Secondary | ICD-10-CM | POA: Diagnosis present

## 2023-10-03 DIAGNOSIS — I2510 Atherosclerotic heart disease of native coronary artery without angina pectoris: Secondary | ICD-10-CM | POA: Diagnosis present

## 2023-10-03 DIAGNOSIS — E785 Hyperlipidemia, unspecified: Secondary | ICD-10-CM | POA: Diagnosis present

## 2023-10-03 DIAGNOSIS — Z471 Aftercare following joint replacement surgery: Secondary | ICD-10-CM | POA: Diagnosis not present

## 2023-10-03 HISTORY — PX: TOTAL SHOULDER REVISION: SHX6130

## 2023-10-03 LAB — CBC WITH DIFFERENTIAL/PLATELET
Abs Immature Granulocytes: 0.08 10*3/uL — ABNORMAL HIGH (ref 0.00–0.07)
Basophils Absolute: 0.1 10*3/uL (ref 0.0–0.1)
Basophils Relative: 1 %
Eosinophils Absolute: 0.3 10*3/uL (ref 0.0–0.5)
Eosinophils Relative: 3 %
HCT: 25.3 % — ABNORMAL LOW (ref 36.0–46.0)
Hemoglobin: 8.1 g/dL — ABNORMAL LOW (ref 12.0–15.0)
Immature Granulocytes: 1 %
Lymphocytes Relative: 21 %
Lymphs Abs: 1.9 10*3/uL (ref 0.7–4.0)
MCH: 29.8 pg (ref 26.0–34.0)
MCHC: 32 g/dL (ref 30.0–36.0)
MCV: 93 fL (ref 80.0–100.0)
Monocytes Absolute: 1 10*3/uL (ref 0.1–1.0)
Monocytes Relative: 11 %
Neutro Abs: 5.8 10*3/uL (ref 1.7–7.7)
Neutrophils Relative %: 63 %
Platelets: 322 10*3/uL (ref 150–400)
RBC: 2.72 MIL/uL — ABNORMAL LOW (ref 3.87–5.11)
RDW: 14.7 % (ref 11.5–15.5)
WBC: 9.2 10*3/uL (ref 4.0–10.5)
nRBC: 0 % (ref 0.0–0.2)

## 2023-10-03 LAB — COMPREHENSIVE METABOLIC PANEL
ALT: 37 U/L (ref 0–44)
AST: 32 U/L (ref 15–41)
Albumin: 3.2 g/dL — ABNORMAL LOW (ref 3.5–5.0)
Alkaline Phosphatase: 240 U/L — ABNORMAL HIGH (ref 38–126)
Anion gap: 10 (ref 5–15)
BUN: 66 mg/dL — ABNORMAL HIGH (ref 8–23)
CO2: 27 mmol/L (ref 22–32)
Calcium: 9.2 mg/dL (ref 8.9–10.3)
Chloride: 100 mmol/L (ref 98–111)
Creatinine, Ser: 2.24 mg/dL — ABNORMAL HIGH (ref 0.44–1.00)
GFR, Estimated: 23 mL/min — ABNORMAL LOW (ref >60)
Glucose, Bld: 100 mg/dL — ABNORMAL HIGH (ref 70–99)
Potassium: 4.4 mmol/L (ref 3.5–5.1)
Sodium: 137 mmol/L (ref 135–145)
Total Bilirubin: 0.5 mg/dL (ref 0.3–1.2)
Total Protein: 7.5 g/dL (ref 6.5–8.1)

## 2023-10-03 LAB — URINALYSIS, ROUTINE W REFLEX MICROSCOPIC
Bilirubin Urine: NEGATIVE
Glucose, UA: NEGATIVE mg/dL
Hgb urine dipstick: NEGATIVE
Ketones, ur: NEGATIVE mg/dL
Nitrite: NEGATIVE
Protein, ur: NEGATIVE mg/dL
Specific Gravity, Urine: 1.011 (ref 1.005–1.030)
pH: 5 (ref 5.0–8.0)

## 2023-10-03 SURGERY — REVISION, TOTAL ARTHROPLASTY, SHOULDER
Anesthesia: General | Site: Shoulder | Laterality: Right

## 2023-10-03 MED ORDER — FENTANYL CITRATE PF 50 MCG/ML IJ SOSY
PREFILLED_SYRINGE | INTRAMUSCULAR | Status: AC
  Filled 2023-10-03: qty 1

## 2023-10-03 MED ORDER — CALCIUM CARBONATE 1250 (500 CA) MG PO TABS
1.0000 | ORAL_TABLET | Freq: Three times a day (TID) | ORAL | Status: DC
  Administered 2023-10-04 (×2): 1250 mg via ORAL
  Filled 2023-10-03 (×3): qty 1

## 2023-10-03 MED ORDER — BISACODYL 10 MG RE SUPP: 10.0000 mg | Freq: Every day | RECTAL | Status: DC | PRN

## 2023-10-03 MED ORDER — CEFAZOLIN SODIUM-DEXTROSE 2-4 GM/100ML-% IV SOLN
2.0000 g | INTRAVENOUS | Status: AC
  Administered 2023-10-03: 2 g via INTRAVENOUS

## 2023-10-03 MED ORDER — DEXAMETHASONE SODIUM PHOSPHATE 10 MG/ML IJ SOLN
INTRAMUSCULAR | Status: AC
  Filled 2023-10-03: qty 1

## 2023-10-03 MED ORDER — BUPIVACAINE LIPOSOME 1.3 % IJ SUSP
INTRAMUSCULAR | Status: AC
  Filled 2023-10-03: qty 10

## 2023-10-03 MED ORDER — MIDAZOLAM HCL 2 MG/2ML IJ SOLN
1.0000 mg | INTRAMUSCULAR | Status: DC | PRN
  Administered 2023-10-03: 1 mg via INTRAVENOUS

## 2023-10-03 MED ORDER — 0.9 % SODIUM CHLORIDE (POUR BTL) OPTIME
TOPICAL | Status: DC | PRN
  Administered 2023-10-03: 500 mL

## 2023-10-03 MED ORDER — VITAMIN B-12 1000 MCG PO TABS
500.0000 ug | ORAL_TABLET | Freq: Every morning | ORAL | Status: DC
  Administered 2023-10-04: 500 ug via ORAL
  Filled 2023-10-03: qty 1

## 2023-10-03 MED ORDER — ADULT MULTIVITAMIN W/MINERALS CH
1.0000 | ORAL_TABLET | Freq: Every morning | ORAL | Status: DC
  Administered 2023-10-04: 1 via ORAL
  Filled 2023-10-03: qty 1

## 2023-10-03 MED ORDER — VASOPRESSIN 20 UNIT/ML IV SOLN
INTRAVENOUS | Status: DC | PRN
  Administered 2023-10-03 (×2): 1 [IU] via INTRAVENOUS

## 2023-10-03 MED ORDER — CALCIUM CARB-CHOLECALCIFEROL 600-400 MG-UNIT PO CAPS: 1.0000 | ORAL_CAPSULE | Freq: Three times a day (TID) | ORAL | Status: DC

## 2023-10-03 MED ORDER — CHLORHEXIDINE GLUCONATE 4 % EX SOLN: 1.0000 | CUTANEOUS | 1 refills | Status: DC

## 2023-10-03 MED ORDER — ROCURONIUM BROMIDE 100 MG/10ML IV SOLN
INTRAVENOUS | Status: DC | PRN
  Administered 2023-10-03: 30 mg via INTRAVENOUS

## 2023-10-03 MED ORDER — TRANEXAMIC ACID-NACL 1000-0.7 MG/100ML-% IV SOLN
INTRAVENOUS | Status: AC
  Filled 2023-10-03: qty 100

## 2023-10-03 MED ORDER — MIDAZOLAM HCL 2 MG/2ML IJ SOLN
INTRAMUSCULAR | Status: AC
  Filled 2023-10-03: qty 2

## 2023-10-03 MED ORDER — VENLAFAXINE HCL ER 150 MG PO CP24
150.0000 mg | ORAL_CAPSULE | Freq: Every day | ORAL | Status: DC
  Administered 2023-10-04: 150 mg via ORAL
  Filled 2023-10-03: qty 1

## 2023-10-03 MED ORDER — CHLORHEXIDINE GLUCONATE 0.12 % MT SOLN
OROMUCOSAL | Status: AC
  Filled 2023-10-03: qty 15

## 2023-10-03 MED ORDER — FERROUS SULFATE 325 (65 FE) MG PO TABS
325.0000 mg | ORAL_TABLET | Freq: Every day | ORAL | Status: DC
  Administered 2023-10-04: 325 mg via ORAL
  Filled 2023-10-03: qty 1

## 2023-10-03 MED ORDER — ROCURONIUM BROMIDE 10 MG/ML (PF) SYRINGE
PREFILLED_SYRINGE | INTRAVENOUS | Status: AC
  Filled 2023-10-03: qty 10

## 2023-10-03 MED ORDER — PHENOL 1.4 % MT LIQD: 1.0000 | OROMUCOSAL | Status: DC | PRN

## 2023-10-03 MED ORDER — PHENYLEPHRINE HCL-NACL 20-0.9 MG/250ML-% IV SOLN
INTRAVENOUS | Status: DC | PRN
  Administered 2023-10-03: 30 ug/min via INTRAVENOUS
  Administered 2023-10-03: 80 ug via INTRAVENOUS
  Administered 2023-10-03: 50 ug/min via INTRAVENOUS
  Administered 2023-10-03 (×2): 80 ug via INTRAVENOUS
  Administered 2023-10-03: 160 ug via INTRAVENOUS
  Administered 2023-10-03: 80 ug via INTRAVENOUS

## 2023-10-03 MED ORDER — FENTANYL CITRATE (PF) 100 MCG/2ML IJ SOLN
INTRAMUSCULAR | Status: DC | PRN
  Administered 2023-10-03: 25 ug via INTRAVENOUS
  Administered 2023-10-03: 50 ug via INTRAVENOUS

## 2023-10-03 MED ORDER — SODIUM CHLORIDE 0.9 % IR SOLN
Status: DC | PRN
  Administered 2023-10-03: 1000 mL

## 2023-10-03 MED ORDER — SPIRONOLACTONE 25 MG PO TABS
25.0000 mg | ORAL_TABLET | Freq: Every morning | ORAL | Status: DC
  Administered 2023-10-04: 25 mg via ORAL
  Filled 2023-10-03: qty 1

## 2023-10-03 MED ORDER — METOCLOPRAMIDE HCL 5 MG PO TABS: 5.0000 mg | ORAL_TABLET | Freq: Three times a day (TID) | ORAL | Status: DC | PRN

## 2023-10-03 MED ORDER — DEXAMETHASONE SODIUM PHOSPHATE 10 MG/ML IJ SOLN
INTRAMUSCULAR | Status: DC | PRN
  Administered 2023-10-03: 10 mg via INTRAVENOUS

## 2023-10-03 MED ORDER — ORAL CARE MOUTH RINSE: 15.0000 mL | Freq: Once | OROMUCOSAL | Status: AC

## 2023-10-03 MED ORDER — TRANEXAMIC ACID-NACL 1000-0.7 MG/100ML-% IV SOLN
1000.0000 mg | Freq: Once | INTRAVENOUS | Status: AC
  Administered 2023-10-03: 1000 mg via INTRAVENOUS

## 2023-10-03 MED ORDER — FENTANYL CITRATE PF 50 MCG/ML IJ SOSY
50.0000 ug | PREFILLED_SYRINGE | Freq: Once | INTRAMUSCULAR | Status: AC
  Administered 2023-10-03: 50 ug via INTRAVENOUS

## 2023-10-03 MED ORDER — OXYCODONE HCL 5 MG PO TABS: 5.0000 mg | ORAL_TABLET | Freq: Once | ORAL | Status: AC | PRN

## 2023-10-03 MED ORDER — PANTOPRAZOLE SODIUM 40 MG PO TBEC
80.0000 mg | DELAYED_RELEASE_TABLET | Freq: Every day | ORAL | Status: DC
  Administered 2023-10-03 – 2023-10-04 (×2): 80 mg via ORAL
  Filled 2023-10-03 (×2): qty 2

## 2023-10-03 MED ORDER — ASPIRIN 325 MG PO TBEC
325.0000 mg | DELAYED_RELEASE_TABLET | Freq: Every day | ORAL | Status: DC
  Administered 2023-10-04: 325 mg via ORAL
  Filled 2023-10-03: qty 1

## 2023-10-03 MED ORDER — OXYCODONE HCL 5 MG/5ML PO SOLN
ORAL | Status: AC
  Filled 2023-10-03: qty 5

## 2023-10-03 MED ORDER — PROPOFOL 10 MG/ML IV BOLUS
INTRAVENOUS | Status: AC
  Filled 2023-10-03: qty 20

## 2023-10-03 MED ORDER — ONDANSETRON HCL 4 MG/2ML IJ SOLN: 4.0000 mg | Freq: Four times a day (QID) | INTRAMUSCULAR | Status: DC | PRN

## 2023-10-03 MED ORDER — HYDROMORPHONE HCL 1 MG/ML IJ SOLN
0.2000 mg | INTRAMUSCULAR | Status: DC | PRN
Start: 2023-10-03 — End: 2023-10-04

## 2023-10-03 MED ORDER — ONDANSETRON HCL 4 MG/2ML IJ SOLN
INTRAMUSCULAR | Status: DC | PRN
  Administered 2023-10-03: 4 mg via INTRAVENOUS

## 2023-10-03 MED ORDER — FUROSEMIDE 40 MG PO TABS
60.0000 mg | ORAL_TABLET | Freq: Every morning | ORAL | Status: DC
  Administered 2023-10-04: 60 mg via ORAL
  Filled 2023-10-03: qty 1

## 2023-10-03 MED ORDER — SUGAMMADEX SODIUM 200 MG/2ML IV SOLN
INTRAVENOUS | Status: DC | PRN
  Administered 2023-10-03: 295.2 mg via INTRAVENOUS

## 2023-10-03 MED ORDER — CEFAZOLIN SODIUM-DEXTROSE 2-4 GM/100ML-% IV SOLN
2.0000 g | Freq: Four times a day (QID) | INTRAVENOUS | Status: AC
  Administered 2023-10-03 – 2023-10-04 (×3): 2 g via INTRAVENOUS
  Filled 2023-10-03 (×3): qty 100

## 2023-10-03 MED ORDER — ENALAPRIL-HYDROCHLOROTHIAZIDE 10-25 MG PO TABS: 1.0000 | ORAL_TABLET | Freq: Every day | ORAL | Status: DC

## 2023-10-03 MED ORDER — FENTANYL CITRATE (PF) 100 MCG/2ML IJ SOLN
INTRAMUSCULAR | Status: AC
  Filled 2023-10-03: qty 2

## 2023-10-03 MED ORDER — SUCCINYLCHOLINE CHLORIDE 200 MG/10ML IV SOSY
PREFILLED_SYRINGE | INTRAVENOUS | Status: DC | PRN
  Administered 2023-10-03: 100 mg via INTRAVENOUS

## 2023-10-03 MED ORDER — SENNOSIDES-DOCUSATE SODIUM 8.6-50 MG PO TABS: 1.0000 | ORAL_TABLET | Freq: Every evening | ORAL | Status: DC | PRN

## 2023-10-03 MED ORDER — METOCLOPRAMIDE HCL 5 MG/ML IJ SOLN: 5.0000 mg | Freq: Three times a day (TID) | INTRAMUSCULAR | Status: DC | PRN

## 2023-10-03 MED ORDER — PHENYLEPHRINE HCL-NACL 20-0.9 MG/250ML-% IV SOLN
INTRAVENOUS | Status: AC
  Filled 2023-10-03: qty 250

## 2023-10-03 MED ORDER — ONDANSETRON HCL 4 MG PO TABS: 4.0000 mg | ORAL_TABLET | Freq: Four times a day (QID) | ORAL | Status: DC | PRN

## 2023-10-03 MED ORDER — VANCOMYCIN HCL 1000 MG IV SOLR
INTRAVENOUS | Status: DC | PRN
  Administered 2023-10-03: 1000 mg

## 2023-10-03 MED ORDER — BUPIVACAINE HCL (PF) 0.5 % IJ SOLN
INTRAMUSCULAR | Status: AC
  Filled 2023-10-03: qty 10

## 2023-10-03 MED ORDER — ONDANSETRON HCL 4 MG/2ML IJ SOLN
INTRAMUSCULAR | Status: AC
  Filled 2023-10-03: qty 2

## 2023-10-03 MED ORDER — PRAMIPEXOLE DIHYDROCHLORIDE 0.25 MG PO TABS
0.1250 mg | ORAL_TABLET | Freq: Every day | ORAL | Status: DC
  Administered 2023-10-03 – 2023-10-04 (×2): 0.125 mg via ORAL
  Filled 2023-10-03 (×2): qty 0.5

## 2023-10-03 MED ORDER — OXYCODONE HCL 5 MG/5ML PO SOLN
5.0000 mg | Freq: Once | ORAL | Status: AC | PRN
  Administered 2023-10-03: 5 mg via ORAL

## 2023-10-03 MED ORDER — VENLAFAXINE HCL ER 75 MG PO CP24
75.0000 mg | ORAL_CAPSULE | Freq: Every day | ORAL | Status: DC
  Administered 2023-10-04: 75 mg via ORAL
  Filled 2023-10-03: qty 1

## 2023-10-03 MED ORDER — EPHEDRINE SULFATE-NACL 50-0.9 MG/10ML-% IV SOSY
PREFILLED_SYRINGE | INTRAVENOUS | Status: DC | PRN
  Administered 2023-10-03: 10 mg via INTRAVENOUS
  Administered 2023-10-03 (×3): 5 mg via INTRAVENOUS

## 2023-10-03 MED ORDER — TRANEXAMIC ACID-NACL 1000-0.7 MG/100ML-% IV SOLN
1000.0000 mg | INTRAVENOUS | Status: AC
  Administered 2023-10-03: 1000 mg via INTRAVENOUS

## 2023-10-03 MED ORDER — ROSUVASTATIN CALCIUM 5 MG PO TABS
5.0000 mg | ORAL_TABLET | Freq: Every day | ORAL | Status: DC
  Administered 2023-10-03: 5 mg via ORAL
  Filled 2023-10-03: qty 1

## 2023-10-03 MED ORDER — DOCUSATE SODIUM 100 MG PO CAPS
100.0000 mg | ORAL_CAPSULE | Freq: Two times a day (BID) | ORAL | Status: DC
  Administered 2023-10-03 – 2023-10-04 (×2): 100 mg via ORAL
  Filled 2023-10-03 (×2): qty 1

## 2023-10-03 MED ORDER — ACETAMINOPHEN 10 MG/ML IV SOLN
INTRAVENOUS | Status: AC
  Filled 2023-10-03: qty 100

## 2023-10-03 MED ORDER — ACETAMINOPHEN 500 MG PO TABS
1000.0000 mg | ORAL_TABLET | Freq: Three times a day (TID) | ORAL | Status: DC
  Administered 2023-10-03 – 2023-10-04 (×3): 1000 mg via ORAL
  Filled 2023-10-03 (×3): qty 2

## 2023-10-03 MED ORDER — CEFAZOLIN SODIUM-DEXTROSE 2-4 GM/100ML-% IV SOLN
INTRAVENOUS | Status: AC
  Filled 2023-10-03: qty 100

## 2023-10-03 MED ORDER — VANCOMYCIN HCL 1000 MG IV SOLR
INTRAVENOUS | Status: AC
  Filled 2023-10-03: qty 20

## 2023-10-03 MED ORDER — HYDROXYCHLOROQUINE SULFATE 200 MG PO TABS
200.0000 mg | ORAL_TABLET | Freq: Two times a day (BID) | ORAL | Status: DC
  Administered 2023-10-03 – 2023-10-04 (×2): 200 mg via ORAL
  Filled 2023-10-03 (×2): qty 1

## 2023-10-03 MED ORDER — CALCITRIOL 0.25 MCG PO CAPS
0.2500 ug | ORAL_CAPSULE | Freq: Every morning | ORAL | Status: DC
  Administered 2023-10-04: 0.25 ug via ORAL
  Filled 2023-10-03: qty 1

## 2023-10-03 MED ORDER — ENALAPRIL MALEATE 10 MG PO TABS
10.0000 mg | ORAL_TABLET | Freq: Every day | ORAL | Status: DC
  Administered 2023-10-04: 10 mg via ORAL
  Filled 2023-10-03: qty 1

## 2023-10-03 MED ORDER — SODIUM CHLORIDE 0.9 % IV SOLN: INTRAVENOUS | Status: DC

## 2023-10-03 MED ORDER — ALUM & MAG HYDROXIDE-SIMETH 200-200-20 MG/5ML PO SUSP: 30.0000 mL | ORAL | Status: DC | PRN

## 2023-10-03 MED ORDER — FENTANYL CITRATE (PF) 100 MCG/2ML IJ SOLN
25.0000 ug | INTRAMUSCULAR | Status: DC | PRN
  Administered 2023-10-03 (×2): 25 ug via INTRAVENOUS

## 2023-10-03 MED ORDER — MIRTAZAPINE 15 MG PO TABS
30.0000 mg | ORAL_TABLET | Freq: Every day | ORAL | Status: DC
  Administered 2023-10-03: 30 mg via ORAL
  Filled 2023-10-03: qty 2

## 2023-10-03 MED ORDER — SUCCINYLCHOLINE CHLORIDE 200 MG/10ML IV SOSY
PREFILLED_SYRINGE | INTRAVENOUS | Status: AC
  Filled 2023-10-03: qty 10

## 2023-10-03 MED ORDER — MUPIROCIN 2 % EX OINT
TOPICAL_OINTMENT | Freq: Two times a day (BID) | CUTANEOUS | Status: DC
  Filled 2023-10-03: qty 22

## 2023-10-03 MED ORDER — PIRFENIDONE 267 MG PO TABS: 3.0000 | ORAL_TABLET | Freq: Three times a day (TID) | ORAL | Status: DC

## 2023-10-03 MED ORDER — VITAMIN D3 25 MCG (1000 UNIT) PO TABS
2000.0000 [IU] | ORAL_TABLET | Freq: Every morning | ORAL | Status: DC
  Administered 2023-10-04: 2000 [IU] via ORAL
  Filled 2023-10-03 (×2): qty 2

## 2023-10-03 MED ORDER — CYCLOBENZAPRINE HCL 10 MG PO TABS
10.0000 mg | ORAL_TABLET | Freq: Every day | ORAL | Status: DC
  Administered 2023-10-03: 10 mg via ORAL
  Filled 2023-10-03: qty 1

## 2023-10-03 MED ORDER — CHLORHEXIDINE GLUCONATE 0.12 % MT SOLN
15.0000 mL | Freq: Once | OROMUCOSAL | Status: AC
  Administered 2023-10-03: 15 mL via OROMUCOSAL

## 2023-10-03 MED ORDER — ACETAMINOPHEN 10 MG/ML IV SOLN
INTRAVENOUS | Status: DC | PRN
  Administered 2023-10-03: 1000 mg via INTRAVENOUS

## 2023-10-03 MED ORDER — OXYCODONE HCL 5 MG PO TABS
10.0000 mg | ORAL_TABLET | ORAL | Status: DC | PRN
Start: 2023-10-03 — End: 2023-10-04

## 2023-10-03 MED ORDER — OXYCODONE HCL 5 MG PO TABS
5.0000 mg | ORAL_TABLET | ORAL | Status: DC | PRN
  Administered 2023-10-04: 5 mg via ORAL
  Filled 2023-10-03: qty 1

## 2023-10-03 MED ORDER — MENTHOL 3 MG MT LOZG: 1.0000 | LOZENGE | OROMUCOSAL | Status: DC | PRN

## 2023-10-03 MED ORDER — VASOPRESSIN 20 UNIT/ML IV SOLN
INTRAVENOUS | Status: AC
  Filled 2023-10-03: qty 1

## 2023-10-03 MED ORDER — HYDROCHLOROTHIAZIDE 25 MG PO TABS
25.0000 mg | ORAL_TABLET | Freq: Every day | ORAL | Status: DC
  Administered 2023-10-04: 25 mg via ORAL
  Filled 2023-10-03: qty 1

## 2023-10-03 MED ORDER — MUPIROCIN 2 % EX OINT: 1.0000 | TOPICAL_OINTMENT | Freq: Two times a day (BID) | CUTANEOUS | 0 refills | Status: AC

## 2023-10-03 MED ORDER — MIDAZOLAM HCL 2 MG/2ML IJ SOLN
INTRAMUSCULAR | Status: DC | PRN
  Administered 2023-10-03: 1 mg via INTRAVENOUS

## 2023-10-03 MED ORDER — PROPOFOL 10 MG/ML IV BOLUS
INTRAVENOUS | Status: DC | PRN
  Administered 2023-10-03: 40 mg via INTRAVENOUS
  Administered 2023-10-03: 30 mg via INTRAVENOUS
  Administered 2023-10-03: 120 mg via INTRAVENOUS

## 2023-10-03 SURGICAL SUPPLY — 79 items
ADH SKN CLS APL DERMABOND .7 (GAUZE/BANDAGES/DRESSINGS)
ANCH SUT .5 CRC TPR CT 40X40 (SUTURE)
ANCH SUT 1.4 SUT TPE BLK/WHT (SUTURE)
APL PRP STRL LF DISP 70% ISPRP (MISCELLANEOUS) ×1
ASMB CBL PN 914X1.8 STRL (Trauma Fixation) ×1 IMPLANT
BIT DRILL 4 DIA CALIBRATED (BIT) IMPLANT
BLADE SAGITTAL WIDE XTHICK NO (BLADE) ×1 IMPLANT
CABLE CERLAGE W/CRIMP 1.8 (Cable) IMPLANT
CABLE READY CERCLAGE W/CRIP (Trauma Fixation) IMPLANT
CHLORAPREP W/TINT 26 (MISCELLANEOUS) ×1 IMPLANT
CNTNR URN SCR LID CUP LEK RST (MISCELLANEOUS) IMPLANT
CONT SPEC 4OZ STRL OR WHT (MISCELLANEOUS) ×1
COOLER POLAR GLACIER W/PUMP (MISCELLANEOUS) ×1 IMPLANT
DERMABOND ADVANCED .7 DNX12 (GAUZE/BANDAGES/DRESSINGS) IMPLANT
DRAPE C-ARM XRAY 36X54 (DRAPES) IMPLANT
DRAPE INCISE IOBAN 66X45 STRL (DRAPES) ×2 IMPLANT
DRAPE SHEET LG 3/4 BI-LAMINATE (DRAPES) ×2 IMPLANT
DRAPE TABLE BACK 80X90 (DRAPES) ×1 IMPLANT
DRAPE U-SHAPE 47X51 STRL (DRAPES) ×1 IMPLANT
DRSG OPSITE POSTOP 3X4 (GAUZE/BANDAGES/DRESSINGS) IMPLANT
DRSG OPSITE POSTOP 4X6 (GAUZE/BANDAGES/DRESSINGS) IMPLANT
DRSG OPSITE POSTOP 4X8 (GAUZE/BANDAGES/DRESSINGS) IMPLANT
DRSG TEGADERM 2-3/8X2-3/4 SM (GAUZE/BANDAGES/DRESSINGS) IMPLANT
ELECT REM PT RETURN 9FT ADLT (ELECTROSURGICAL) ×1
ELECTRODE REM PT RTRN 9FT ADLT (ELECTROSURGICAL) ×1 IMPLANT
EVACUATOR 1/8 PVC DRAIN (DRAIN) IMPLANT
GAUZE SPONGE 2X2 STRL 8-PLY (GAUZE/BANDAGES/DRESSINGS) IMPLANT
GAUZE XEROFORM 1X8 LF (GAUZE/BANDAGES/DRESSINGS) IMPLANT
GLOVE BIOGEL PI IND STRL 8 (GLOVE) ×2 IMPLANT
GLOVE PI ULTRA LF STRL 7.5 (GLOVE) ×2 IMPLANT
GLOVE SURG ORTHO 8.0 STRL STRW (GLOVE) ×2 IMPLANT
GLOVE SURG SYN 8.0 (GLOVE) ×1 IMPLANT
GLOVE SURG SYN 8.0 PF PI (GLOVE) ×1 IMPLANT
GOWN STRL REUS W/ TWL LRG LVL3 (GOWN DISPOSABLE) ×2 IMPLANT
GOWN STRL REUS W/ TWL XL LVL3 (GOWN DISPOSABLE) ×1 IMPLANT
GOWN STRL REUS W/TWL LRG LVL3 (GOWN DISPOSABLE) ×2
GOWN STRL REUS W/TWL XL LVL3 (GOWN DISPOSABLE) ×1
HOOD PEEL AWAY T7 (MISCELLANEOUS) ×2 IMPLANT
INSERT SMALL SOCKET 32MM (Insert) IMPLANT
INSERT SMALL SOCKET 32MM NEU (Insert) IMPLANT
JET LAVAGE IRRISEPT WOUND (IRRIGATION / IRRIGATOR) ×1
LAVAGE JET IRRISEPT WOUND (IRRIGATION / IRRIGATOR) IMPLANT
MANIFOLD NEPTUNE II (INSTRUMENTS) ×1 IMPLANT
MASK FACE SPIDER DISP (MASK) ×1 IMPLANT
MAT ABSORB FLUID 56X50 GRAY (MISCELLANEOUS) ×1 IMPLANT
NDL REVERSE CUT 1/2 CRC (NEEDLE) IMPLANT
NDL SPNL 20GX3.5 QUINCKE YW (NEEDLE) IMPLANT
NEEDLE REVERSE CUT 1/2 CRC (NEEDLE) IMPLANT
NEEDLE SPNL 20GX3.5 QUINCKE YW (NEEDLE) IMPLANT
NS IRRIG 1000ML POUR BTL (IV SOLUTION) ×1 IMPLANT
PACK ARTHROSCOPY SHOULDER (MISCELLANEOUS) ×1 IMPLANT
PAD ABD DERMACEA PRESS 5X9 (GAUZE/BANDAGES/DRESSINGS) IMPLANT
PAD WRAPON POLAR SHDR XLG (MISCELLANEOUS) ×1 IMPLANT
PULSAVAC PLUS IRRIG FAN TIP (DISPOSABLE) ×1
SLING ULTRA II LG (MISCELLANEOUS) IMPLANT
SLING ULTRA II M (MISCELLANEOUS) IMPLANT
SPONGE T-LAP 18X18 ~~LOC~~+RFID (SPONGE) ×1 IMPLANT
STAPLER SKIN PROX 35W (STAPLE) IMPLANT
STEM HUMERAL REVERSE S 12X108 (Stem) IMPLANT
STRAP SAFETY 5IN WIDE (MISCELLANEOUS) ×1 IMPLANT
SUT ETHIBOND 5-0 MS/4 CCS GRN (SUTURE) ×1
SUT FIBERWIRE #2 38 BLUE 1/2 (SUTURE) ×1
SUT MNCRL AB 4-0 PS2 18 (SUTURE) IMPLANT
SUT PROLENE 6 0 P 1 18 (SUTURE) IMPLANT
SUT TICRON 2-0 30IN 311381 (SUTURE) ×2 IMPLANT
SUT VIC AB 0 CT1 36 (SUTURE) ×1 IMPLANT
SUT VIC AB 2-0 CT2 27 (SUTURE) ×2 IMPLANT
SUT XBRAID 1.4 BLK/WHT (SUTURE) IMPLANT
SUT XBRAID 1.4 BLUE (SUTURE) IMPLANT
SUT XBRAID 1.4 WHITE/BLUE (SUTURE) IMPLANT
SUT XBRAID 2 BLACK/BLUE (SUTURE) IMPLANT
SUTURE ETHBND 5-0 MS/4 CCS GRN (SUTURE) ×1 IMPLANT
SUTURE FIBERWR #2 38 BLUE 1/2 (SUTURE) ×1 IMPLANT
SWAB CULTURE AMIES ANAERIB BLU (MISCELLANEOUS) IMPLANT
SYR 30ML LL (SYRINGE) IMPLANT
TIP FAN IRRIG PULSAVAC PLUS (DISPOSABLE) ×1 IMPLANT
TRAP FLUID SMOKE EVACUATOR (MISCELLANEOUS) ×1 IMPLANT
WATER STERILE IRR 500ML POUR (IV SOLUTION) ×1 IMPLANT
WRAPON POLAR PAD SHDR XLG (MISCELLANEOUS) ×1

## 2023-10-03 NOTE — Transfer of Care (Signed)
Immediate Anesthesia Transfer of Care Note  Patient: Tonya Cummings  Procedure(s) Performed: Revision right reverse shoulder arthroplasty with conversion to long humeral stem and open reduction internal fixation of the humerus (Right: Shoulder)  Patient Location: PACU  Anesthesia Type:General  Level of Consciousness: drowsy  Airway & Oxygen Therapy: Patient Spontanous Breathing and Patient connected to face mask oxygen  Post-op Assessment: Report given to RN and Post -op Vital signs reviewed and stable  Post vital signs: Reviewed and stable  Last Vitals:  Vitals Value Taken Time  BP 123/56 10/03/23 1530  Temp    Pulse 92   Resp 16 10/03/23 1534  SpO2 100 % 10/03/23 1529  Vitals shown include unfiled device data.  Last Pain:  Vitals:   10/03/23 0940  TempSrc: Temporal  PainSc: 7          Complications: No notable events documented.

## 2023-10-03 NOTE — Op Note (Signed)
SURGERY DATE:10/03/2023    PRE-OP DIAGNOSIS:  1.  Periprosthetic fracture of right humerus   POST-OP DIAGNOSIS:  1.  Periprosthetic fracture of right humerus   PROCEDURES:  1. Revision right shoulder arthroplasty with conversion to longstem humeral component 2. Open reduction and internal fixation of right proximal humerus   SURGEON: Rosealee Albee, MD  ASSISTANT: Sonny Dandy, PA; Ahmed Prima, PA-S    ANESTHESIA: Gen + interscalene block with Exparel   ESTIMATED BLOOD LOSS: 200cc   TOTAL IV FLUIDS: per anesthesia record   IMPLANTS: Original Implants:D[employer] Surgical: RSP Glenoid Head w/Retaining screw 32-4; Monoblock Reverse Shoulder Baseplate with 6.8mm central screw; 3 locking screws into baseplate (superior, posterior, inferior); Small Shell Short Humeral Stem 10 x 48mm; +56mm Small Socket Insert;    New implants: Changed small shell humeral stem to standard humeral stem 12 x 108 mm; +4 small socket insert. Glenoid baseplate and glenosphere well-fixed and retained.   INDICATION(S): Tonya Cummings is a 72 y.o. female who initially underwent right RSA by me on 08/30/2023.  She was progressing appropriately postoperatively until approximately 10 days ago when she began to develop significantly increased pain in the right shoulder.  Follow-up radiographs showed a periprosthetic fracture about the humeral implant with loose component. After discussion of risks, benefits, and alternatives to surgery, the patient elected to proceed in the above fashion with the goal of regaining a stable shoulder and possibly improved function as well.   OPERATIVE FINDINGS: Periprosthetic fracture of the proximal humerus with loose humeral component; no overt signs of infection.   OPERATIVE REPORT:   I identified Tonya Cummings in the pre-operative holding area. Informed consent was obtained and the surgical site was marked. I reviewed the risks and benefits of the proposed surgical intervention and  the patient wished to proceed. An interscalene block with Exparel was administered by the Anesthesia team. The patient was transferred to the operative suite and general anesthesia was administered. The patient was placed in the beach chair position with the head of the bed elevated approximately 45 degrees. All down side pressure points were appropriately padded. Pre-op exam under anesthesia confirmed some stiffness and crepitus. Appropriate IV antibiotics were administered. The extremity was then prepped and draped in standard fashion. A time out was performed confirming the correct extremity, correct patient, and correct procedure.    Prior deltopectoral incision was utilized. Cephalic vein was not visualized, but deltopectoral interval was obvious. This was opened and retractors were placed. We gently palpated the axillary nerve and verified its position and continuity on both sides of the humerus with a Tug test. This test was repeated multiple times during the procedure for nerve localization and confirmed to be intact at the end of the case. The subscapularis was noted to be deficient.    At this point, the joint was easily dislocated. The poly insert was removed by drilling a hole along the lateral aspect and utilizing a tap through the same hole until the poly disassociated. The humeral component was noted to be loose and could be pulled out by hand. A total of 5 samples plus a culture swab of joint fluid was sent for pathology/culture.  This included a sample from the humeral canal. Intraoperatively, Pathology reported <5 WBC/HPF for all specimens. Scar tissue was removed about the humeral component. There were no signs of infection.   The periprosthetic fracture line was identified.  An appropriate read for patient could be obtained visually.  A cerclage cable was  passed around the humeral canal and tensioned slightly to hold the fracture fragments in a relatively reduced position.  A second  cerclage cable was placed inferiorly in a similar fashion.  Intraoperative fluoroscopy was utilized to ensure appropriate reduction.  The humeral canal was broached and trial standard length humeral stems were placed.  Appropriate fit was achieved with a 12 x stem.  An 0 poly insert was placed.  This seemed to achieve appropriate stability with significant difficulty dislocating the shoulder.  The humeral component was pulse lavaged. Actual humeral stem was placed.  Previously passed cerclage cables for humeral fixation were further tensioned and crimped.  Wires were cut.  On examination, there was some motion of the greater tuberosity fragment.  A third cerclage cable was placed around this fragment and tensioned crimped appropriately.  This achieved excellent stability of the proximal humerus.  The humeral stem was sunk a little further than the trial.  A +4 poly component was trialed and achieved satisfactory stability and motion. Actual +4 poly insert was placed and care was taken to make sure it was seated. The joint was reduced. Motion, tension, and stability were verified again.  A Hemovac drain was placed.  The wound was thoroughly pulse lavaged and Irrisept solution was utilized to irrigate the wound.  Vancomycin powder was placed. Subscapularis was unable to be repaired.   We closed the deltopectoral interval with a running, 0-Vicryl suture. The skin was closed with 2-0 Vicryl and staples. Xeroform and Honeycomb dressing was applied. A PolarCare unit and sling were placed. Patient was extubated, transferred to a stretcher bed and to the post antesthesia care unit in stable condition.    POSTOPERATIVE PLAN: The patient will be admitted with plan for discharge home on POD#1. Operative arm to remain in sling at all times except RoM exercises and hygiene. Can perform pendulums, elbow/wrist/hand RoM exercises.  Avoid passive range of motion of the shoulder until follow up visit.  ASA for DVT  prophylaxis x 6 weeks. Patient to return to clinic in ~2 weeks for post-operative appointment.

## 2023-10-03 NOTE — Plan of Care (Signed)
  Problem: Education: Goal: Knowledge of General Education information will improve Description: Including pain rating scale, medication(s)/side effects and non-pharmacologic comfort measures Outcome: Progressing   Problem: Activity: Goal: Risk for activity intolerance will decrease Outcome: Progressing   Problem: Nutrition: Goal: Adequate nutrition will be maintained Outcome: Progressing   Problem: Elimination: Goal: Will not experience complications related to urinary retention Outcome: Progressing   Problem: Pain Managment: Goal: General experience of comfort will improve Outcome: Progressing   Problem: Pain Management: Goal: Pain level will decrease with appropriate interventions Outcome: Progressing

## 2023-10-03 NOTE — Anesthesia Postprocedure Evaluation (Signed)
Anesthesia Post Note  Patient: Tonya Cummings  Procedure(s) Performed: Revision right reverse shoulder arthroplasty with conversion to long humeral stem and open reduction internal fixation of the humerus (Right: Shoulder)  Patient location during evaluation: PACU Anesthesia Type: General Level of consciousness: awake and alert Pain management: pain level controlled Vital Signs Assessment: post-procedure vital signs reviewed and stable Respiratory status: spontaneous breathing, nonlabored ventilation, respiratory function stable and patient connected to nasal cannula oxygen Cardiovascular status: blood pressure returned to baseline and stable Postop Assessment: no apparent nausea or vomiting Anesthetic complications: no   No notable events documented.   Last Vitals:  Vitals:   10/03/23 1615 10/03/23 1622  BP: 117/60   Pulse: 93 91  Resp: (!) 22 20  Temp:    SpO2: 100% 100%    Last Pain:  Vitals:   10/03/23 1622  TempSrc:   PainSc: 6                  Louie Boston

## 2023-10-03 NOTE — Anesthesia Procedure Notes (Signed)
Procedure Name: Intubation Date/Time: 10/03/2023 12:12 PM  Performed by: Elisabeth Pigeon, CRNAPre-anesthesia Checklist: Patient identified, Patient being monitored, Timeout performed, Emergency Drugs available and Suction available Patient Re-evaluated:Patient Re-evaluated prior to induction Oxygen Delivery Method: Circle system utilized Preoxygenation: Pre-oxygenation with 100% oxygen Induction Type: IV induction Ventilation: Mask ventilation without difficulty Laryngoscope Size: Mac, 3 and McGraph Grade View: Grade I Tube type: Oral Tube size: 7.0 mm Number of attempts: 1 Airway Equipment and Method: Stylet Placement Confirmation: ETT inserted through vocal cords under direct vision, positive ETCO2 and breath sounds checked- equal and bilateral Secured at: 22 cm Tube secured with: Tape Dental Injury: Teeth and Oropharynx as per pre-operative assessment

## 2023-10-03 NOTE — Anesthesia Preprocedure Evaluation (Signed)
Anesthesia Evaluation  Patient identified by MRN, date of birth, ID band Patient awake    Reviewed: Allergy & Precautions, NPO status , Patient's Chart, lab work & pertinent test results  History of Anesthesia Complications (+) PROLONGED EMERGENCE and history of anesthetic complications (following ureteroscopy 07/2023)  Airway Mallampati: IV   Neck ROM: Full    Dental  (+) Missing, Loose, Dental Advidsory Given,    Pulmonary shortness of breath (pulmonary fibrosis), sleep apnea  RA associcated ILD and pulmonary fibrosis  PULMONARY FUNCTION TESTING performed on 04/07/2023 1. 04/07/2023- FEV1 - 1.52L- 71%  2. 08/03/2022- FEV1- 1.16L - 51% 3. 03/18/2022- FEV1 - 1.19L - 55% 4. 10/26/2021- FEV1- 0.88L- 39%    Pulmonary exam normal        Cardiovascular Exercise Tolerance: Poor hypertension, + CAD (on CT scan), +CHF (Heart failure with preserved ejection fraction) and + DOE   Rhythm:Regular Rate:Normal + Peripheral Edema ECG 05/28/23: NSR; LVH; ST and T abnormality  Echo 09/30/21:  1. Left ventricular ejection fraction, by estimation, is 55 to 60%. The  left ventricle has normal function. The left ventricle has no regional  wall motion abnormalities. Left ventricular diastolic parameters are  consistent with Grade I diastolic  dysfunction (impaired relaxation).  2. Right ventricular systolic function is normal. The right ventricular size is normal.  3. The mitral valve is normal in structure. Mild mitral valve regurgitation.  4. The aortic valve is normal in structure. Aortic valve regurgitation is not visualized.     Neuro/Psych  Headaches PSYCHIATRIC DISORDERS Anxiety Depression    chronic thoracic compression fractures, RLS, lumbar DDD with associated chronic radicular lower back pai  Pt states she has a cervical disc with disease and in need of a neurosurgical procedure. Her head is positioned with rightward tilt. There is  mechanical and pain resistance to range of motion of neck to the left and backward. Movement of neck does not cause worsening extremity pain or neurological symptoms. She endorses more left than right numbness and pain in arms.   Neuromuscular disease    GI/Hepatic hiatal hernia (large),GERD  ,,GERD with hiatal hernia which may be contributing with laryngopharyngeal reflux   Endo/Other  Obesity   Renal/GU Renal InsufficiencyRenal disease (stage IV CKD)Stable large right renal mass     Musculoskeletal  (+) Arthritis , Rheumatoid disorders,  inflammatory arthropathy with rheumatoid and Raynaud's phenomenon   Abdominal Normal abdominal exam  (+)   Peds  Hematology  (+) Blood dyscrasia, anemia   Anesthesia Other Findings Patient had previous shoulder surgery with an interscalene block in 05/24. Patient states she did well with her regional block. Agrees to proceed with interscalene block today. Discussed risks of regional block thoroughly, including shortness of breath. Patient stated she understood and agreed to proceed.    TTE 09/30/21  1. Left ventricular ejection fraction, by estimation, is 55 to 60%. The  left ventricle has normal function. The left ventricle has no regional  wall motion abnormalities. Left ventricular diastolic parameters are  consistent with Grade I diastolic  dysfunction (impaired relaxation).   2. Right ventricular systolic function is normal. The right ventricular  size is normal.   3. The mitral valve is normal in structure. Mild mitral valve  regurgitation.   4. The aortic valve is normal in structure. Aortic valve regurgitation is  not visualized.     Cardiology Note from 09/12/23: 1. Chronic diastolic congestive heart failure without symptoms today.  Had an episode of fluid overload postoperatively during recent  hospitalization.  Echocardiogram revealed an LVEF of 55 to 60%, G1 DD, mild mitral valve regurgitation.  Patient appears to be euvolemic on  exam today.  She is continued on furosemide and spironolactone.  She is also being sent for follow-up BMP today.  Encouraged to weigh herself daily and limit her sodium intake.  Unfortunately she is not a good candidate for SGLT2 inhibitors due to her kidney function.   2. Primary hypertension blood pressure today 142/70 which is likely driven from pain as patient states that she is not pain this morning.  She is continued on her current medication regimen.  She has been encouraged to continue to monitor blood pressure 1 to 2 hours postmedication administration.   3. CKD stage III-IV with last serum creatinine of 1.90 and a BUN of 42.  States she continues to follow with nephrology.  Encouraged to avoid use of NSAIDs.   4. Chronic back pain with severe neck pain associated symptoms of numbness and tingling to her bilateral upper extremities which she continues to follow with neurosurgery.   5. Status post right shoulder rotator cuff repair that continues to be followed by orthopedics.  Cardiology note 05/10/23:  1. Chronic diastolic congestive heart failure without symptoms today.  Last echocardiogram was completed in 2020 with an LVEF of 55-60%, no regional wall motion abnormalities, G1 DD, mild mitral valve regurgitation.  Patient appears euvolemic on exam today.  She is continued on furosemide 60 mg daily and spironolactone 25 mg daily.   2. Essential hypertension with blood pressure today 120/66.  She is continued on furosemide and spironolactone.  Encouraged to continue to monitor pressure 1 to 2 hours post medication administration.   3. CKD stage IV with a last serum creatinine 2.92 in 2/24, prescription sent to be followed by nephrology.   4. Chronic back pain with severe neck pain with associated symptoms of numbness and tingling to the bilateral upper extremities.  She has upcoming surgery with neurosurgery.   5. Preoperative cardiovascular risk assessment completed.    Ms. Bick  perioperative risk of a major cardiac event is 6.6% according to the Revised Cardiac Risk Index (RCRI).  Therefore, she is at high risk for perioperative complications.   Her functional capacity is poor at 4.64 METs according to the Duke Activity Status Index (DASI). Recommendations: According to ACC/AHA guidelines, no further cardiovascular testing needed.  The patient may proceed to surgery at acceptable risk.     6.   Disposition patient return to clinic to see MD/APP in 1 year or sooner if needed with EKG on return.      Pulmonology note 04/07/23:  Dyspnea on exertion and shortness of breath at rest -Etiology is multifactorial-including chronic anemia, physical deconditioning, obstructive sleep apnea, heart and kidney failure, morbid obesity, inflammatory arthropathy with rheumatoid and Raynaud's phenomenon, metabolic syndrome, and likely GERD with hiatal hernia which may be contributing with laryngopharyngeal reflux. -Heart failure with preserved ejection fraction- most recent transthoracic echo October 2022 with EF 55 to 60% with normal left ventricular function and no regional wall abnormalities. Additionally grade 1 diastolic dysfunction was noted. -Chronic kidney disease stage 4-followed by [facility]- Dr. Lourdes Sledge -in the context of rheumatoid arthritis and NSAID use with protein urea and MGUS. Continuing diuresis with Lasix chronically -Chronic anemia-followed by medical oncology-Dr. Rao-status post colonoscopy with a 5 mm polyp in the proximal ascending colon and diverticulosis of the sigmoid. EGD was normal B12 was normal.  Restless leg syndrome IRLS 16  -  continue pramipexole - continue iron supplementation  Obstructive Sleep Apnea Screening  Snorring: -Y Excessive datytime sleepiness: -Y Gasping or Choking during Sleep: 0Y Morning Headaches -YES  Trouble concentrating : -YES  Depression: PHQ2 - POSITIVE FOR DEPRSSION  Restlessness during Sleep: IRLS 17  Epworth  Score: 13 Neck circumference >16in F or >17in M: 17 Structural abnormalities: Retrogranthia   Obesity: MBI 38 Caffeine intake : n Alcohol intake : n Medications : -remeron, mirapex, prozac  Symptoms of insomnia, hypersomnia, parasomnias, circadian rhythm disorder: n- patient is good candidate for CPAP and is agreeable to wearing device. She would benefit from treatment of OSA.   CPAP candidate: claustrophobia, interface issues: -none   Face to face discussion regarding Sleep Study done with patient and patient is agreeable: -patient is agreeable   Rheumatoid arthritis - patient with this condition may develop restrictive lung disease with Rheumatoid nodules and pulmonary fibrosis in pattern consistent with UIP.  -CT chest high resolution for ILD and review of pulmonary fibrotic disease prior to initiation of Esbriet April 2023 CT chest  Lungs/Pleura: Asymmetric scarring and volume loss of the right lung  base with elevation of the right hemidiaphragm, featuring irregular  peripheral interstitial opacity and ground-glass, arcing subpleural  elements, and evidence of subpleural sparing (series 5, image 115).  There are multiple small bilateral pulmonary nodules, the largest in  the right lower lobe measuring 0.7 cm (series 5, image 110). Mild,  lobular air trapping on expiratory phase imaging. No pleural  effusion or pneumothorax.   Progressive Pulmonary fibrosis - She does have rheumatoid arthritis and this is associated with UIP pattern of ILD as well as other subtypes -She wishes to start Antifibrotic - we have not been able to get approval by insurance  -RA associcated ILD and pulmonary fibrosis  Morbid obesity BMI >38 -Recommend dietary changes and bariatric evaluation -We discussed changes in diet - recommend reduction in PO intake with fluid restriction -she lost 40lbs with lifestyle modification and some of the loss was fluid.    Reproductive/Obstetrics                              Anesthesia Physical Anesthesia Plan  ASA: 3  Anesthesia Plan: General   Post-op Pain Management: Regional block*   Induction: Intravenous  PONV Risk Score and Plan: 3 and Ondansetron, Dexamethasone and Midazolam  Airway Management Planned: Oral ETT  Additional Equipment:   Intra-op Plan:   Post-operative Plan: Extubation in OR  Informed Consent: I have reviewed the patients History and Physical, chart, labs and discussed the procedure including the risks, benefits and alternatives for the proposed anesthesia with the patient or authorized representative who has indicated his/her understanding and acceptance.     Dental advisory given  Plan Discussed with: CRNA, Surgeon and Anesthesiologist  Anesthesia Plan Comments: (Patient consented for risks of anesthesia including but not limited to:  - adverse reactions to medications - damage to eyes, teeth, lips or other oral mucosa - nerve damage due to positioning  - sore throat or hoarseness - damage to heart, brain, nerves, lungs, other parts of body or loss of life  Informed patient about role of CRNA in peri- and intra-operative care.  Patient voiced understanding.)        Anesthesia Quick Evaluation

## 2023-10-03 NOTE — H&P (Signed)
Paper H&P to be scanned into permanent record. H&P reviewed. No significant changes noted.  

## 2023-10-04 ENCOUNTER — Encounter: Payer: Medicare HMO | Admitting: Orthopedic Surgery

## 2023-10-04 ENCOUNTER — Encounter: Payer: Self-pay | Admitting: Orthopedic Surgery

## 2023-10-04 LAB — BASIC METABOLIC PANEL
Anion gap: 10 (ref 5–15)
BUN: 60 mg/dL — ABNORMAL HIGH (ref 8–23)
CO2: 26 mmol/L (ref 22–32)
Calcium: 8.4 mg/dL — ABNORMAL LOW (ref 8.9–10.3)
Chloride: 101 mmol/L (ref 98–111)
Creatinine, Ser: 2.08 mg/dL — ABNORMAL HIGH (ref 0.44–1.00)
GFR, Estimated: 25 mL/min — ABNORMAL LOW (ref >60)
Glucose, Bld: 173 mg/dL — ABNORMAL HIGH (ref 70–99)
Potassium: 4.4 mmol/L (ref 3.5–5.1)
Sodium: 137 mmol/L (ref 135–145)

## 2023-10-04 LAB — CBC
HCT: 22.2 % — ABNORMAL LOW (ref 36.0–46.0)
Hemoglobin: 7 g/dL — ABNORMAL LOW (ref 12.0–15.0)
MCH: 29 pg (ref 26.0–34.0)
MCHC: 31.5 g/dL (ref 30.0–36.0)
MCV: 92.1 fL (ref 80.0–100.0)
Platelets: 286 10*3/uL (ref 150–400)
RBC: 2.41 MIL/uL — ABNORMAL LOW (ref 3.87–5.11)
RDW: 14.6 % (ref 11.5–15.5)
WBC: 10.7 10*3/uL — ABNORMAL HIGH (ref 4.0–10.5)
nRBC: 0 % (ref 0.0–0.2)

## 2023-10-04 LAB — PREPARE RBC (CROSSMATCH)

## 2023-10-04 MED ORDER — SODIUM CHLORIDE 0.9% IV SOLUTION: Freq: Once | INTRAVENOUS | Status: AC

## 2023-10-04 MED ORDER — OXYCODONE HCL 5 MG PO TABS: 5.0000 mg | ORAL_TABLET | ORAL | 0 refills | Status: DC | PRN

## 2023-10-04 NOTE — Evaluation (Signed)
Physical Therapy Evaluation Patient Details Name: Tonya Cummings MRN: [id number] DOB: 21-Apr-1951 Today's Date: 10/04/2023  History of Present Illness  Pt s/p revision right reverse shoulder arthroplasty with conversion to long humeral stem and open reduction internal fixation of the humerus. PMH includes: headaches, axiety, depression, GERD, renal disease, arthritis, CKD, costochondritis, hip dysplasia, hyperlipidemia, HTN, lumbago, osteoporsosi, rheumatoid arthritis, seasonal allergies, sleep disorder, and thoracic compression fx.   Clinical Impression  Pt alert, agreeable to PT, motivated to discharge home, reported mild pain in RUE. Per pt with recent shoulder surgeries she has been needing assistance with all ADLs, stair navigation and supervision for ambulation. She also uses a SPC at baseline.  The patient was able to perfom bed mobility with supervision and elevated HOB. Good sitting balance noted. Sit <> Stand with SPC and CGA, and she ambulated ~166ft. A few instances of unsteadiness but no LOB. Pt endorsed fatigue and SOB (stated this is a baseline issue as well). Noted for spO2 to be in mid 80s on room air, placed on 2L to aide in recovery ~ . Back on room air at end of session at 91%, RN notified.  Overall the patient demonstrated deficits (see "PT Problem List") that impede the patient's functional abilities, safety, and mobility and would benefit from skilled PT intervention.       If plan is discharge home, recommend the following: A little help with walking and/or transfers;A little help with bathing/dressing/bathroom;Assistance with cooking/housework;Assist for transportation;Assistance with feeding;Direct supervision/assist for medications management;Help with stairs or ramp for entrance   Can travel by private vehicle        Equipment Recommendations None recommended by PT  Recommendations for Other Services       Functional Status Assessment Patient has had a  recent decline in their functional status and demonstrates the ability to make significant improvements in function in a reasonable and predictable amount of time.     Precautions / Restrictions Precautions Precautions: Fall Restrictions Weight Bearing Restrictions: Yes RUE Weight Bearing: Non weight bearing      Mobility  Bed Mobility Overal bed mobility: Needs Assistance Bed Mobility: Supine to Sit     Supine to sit: HOB elevated, Used rails          Transfers Overall transfer level: Needs assistance Equipment used: Straight cane Transfers: Sit to/from Stand Sit to Stand: Contact guard assist                Ambulation/Gait Ambulation/Gait assistance: Contact guard assist Gait Distance (Feet): 110 Feet Assistive device: Straight cane         General Gait Details: 1-2 instances of unsteadiness, no true LOB. but pt very fatigued and SOB after ambulation  Stairs            Wheelchair Mobility     Tilt Bed    Modified Rankin (Stroke Patients Only)       Balance Overall balance assessment: Needs assistance Sitting-balance support: Feet supported Sitting balance-Leahy Scale: Good     Standing balance support: Single extremity supported Standing balance-Leahy Scale: Fair                               Pertinent Vitals/Pain Pain Assessment Pain Assessment: 0-10 Pain Score: 3  Pain Location: R shoulder Pain Descriptors / Indicators: Aching Pain Intervention(s): Limited activity within patient's tolerance, Monitored during session, Repositioned    Home Living Family/patient expects to be discharged to:: Private  residence Living Arrangements: Spouse/significant other;Children Available Help at Discharge: Family;Available 24 hours/day Type of Home: Mobile home Home Access: Stairs to enter Entrance Stairs-Rails: Can reach both;Right;Left Entrance Stairs-Number of Steps: 3   Home Layout: One level Home Equipment: Agricultural consultant  (2 wheels);Cane - single point;BSC/3in1      Prior Function               Mobility Comments: husband has been helping with ambulation, stair navigation ADLs Comments: husband has been helping with ADLs due to recenty shoulder surgeries     Extremity/Trunk Assessment   Upper Extremity Assessment Upper Extremity Assessment: Defer to OT evaluation    Lower Extremity Assessment Lower Extremity Assessment: Generalized weakness       Communication      Cognition Arousal: Alert Behavior During Therapy: WFL for tasks assessed/performed Overall Cognitive Status: Within Functional Limits for tasks assessed                                          General Comments      Exercises     Assessment/Plan    PT Assessment Patient needs continued PT services  PT Problem List Decreased strength;Decreased range of motion;Decreased activity tolerance;Decreased balance;Decreased mobility;Decreased knowledge of precautions;Decreased knowledge of use of DME;Pain       PT Treatment Interventions DME instruction;Neuromuscular re-education;Gait training;Stair training;Patient/family education;Functional mobility training;Therapeutic activities;Therapeutic exercise;Balance training    PT Goals (Current goals can be found in the Care Plan section)  Acute Rehab PT Goals Patient Stated Goal: to go home PT Goal Formulation: With patient Time For Goal Achievement: 10/18/23 Potential to Achieve Goals: Good    Frequency 7X/week     Co-evaluation               AM-PAC PT "6 Clicks" Mobility  Outcome Measure Help needed turning from your back to your side while in a flat bed without using bedrails?: A Little Help needed moving from lying on your back to sitting on the side of a flat bed without using bedrails?: A Little Help needed moving to and from a bed to a chair (including a wheelchair)?: A Little Help needed standing up from a chair using your arms (e.g.,  wheelchair or bedside chair)?: None Help needed to walk in hospital room?: A Little Help needed climbing 3-5 steps with a railing? : A Little 6 Click Score: 19    End of Session Equipment Utilized During Treatment: Gait belt;Oxygen (oxygen in middle of session on room air at end of session) Activity Tolerance: Patient limited by fatigue Patient left: in chair;with call bell/phone within reach Nurse Communication: Mobility status;Other (comment) (oxygen) PT Visit Diagnosis: Other abnormalities of gait and mobility (R26.89);Muscle weakness (generalized) (M62.81);Pain;Difficulty in walking, not elsewhere classified (R26.2) Pain - Right/Left: Right Pain - part of body: Shoulder    Time: 0912-0930 PT Time Calculation (min) (ACUTE ONLY): 18 min   Charges:   PT Evaluation $PT Eval Low Complexity: 1 Low PT Treatments $Therapeutic Activity: 8-22 mins PT General Charges $$ ACUTE PT VISIT: 1 Visit         Olga Coaster PT, DPT 12:13 PM,10/04/23

## 2023-10-04 NOTE — Discharge Instructions (Signed)
Tonya Albee, MD  [facility]  Phone: [phone]  Fax: [phone]   Discharge Instructions after Reverse Shoulder Replacement    1. Activity/Sling: You are to be non-weight bearing on operative extremity. A sling/shoulder immobilizer has been provided for you. Only remove the sling to perform elbow, wrist, and hand RoM exercises and hygiene/dressing. Active reaching and lifting are not permitted. You will be given further instructions on sling use at your first physical therapy visit and postoperative visit with Dr. Allena Katz.   2. Dressings: Dressing may be removed at 1st physical therapy visit (~3-4 days after surgery). Afterwards, you may either leave open to air (if no drainage) or cover with dry, sterile dressing. If you have steri-strips on your wound, please do not remove them. They will fall off on their own. You may shower 5 days after surgery. Please pat incision dry. Do not rub or place any shear forces across incision. If there is drainage or any opening of incision after 5 days, please notify our offices immediately.    3. Driving:  Plan on not driving for six weeks. Please note that you are advised NOT to drive while taking narcotic pain medications as you may be impaired and unsafe to drive.   4. Medications:  - You have been provided a prescription for narcotic pain medicine (usually oxycodone). After surgery, take 1-2 narcotic tablets every 4 hours if needed for severe pain. Please start this as soon as you begin to start having pain (if you received a nerve block, start taking as soon as this wears off).  - A prescription for anti-nausea medication will be provided in case the narcotic medicine causes nausea - take 1 tablet every 6 hours only if nauseated.  - Take enteric coated aspirin 325 mg once daily for 6 weeks to prevent blood clots. Do not take aspirin if you have an aspirin sensitivity/allergy or asthma or are on an anticoagulant (blood thinner) already. If so, then  your home anticoagulant will be resume and managed - do not take aspirin. -Take tylenol 1000mg  (2 Extra strength or 3 regular strength tablets) every 8 hours for pain. This will reduce the amount of narcotic medication needed. May stop tylenol when you are having minimal pain. - Take a stool softener (Colace, Dulcolax or Senakot) if you are using narcotic pain medications to help with constipation that is associated with narcotic use. - DO NOT take ANY nonsteroidal anti-inflammatory pain medications: Advil, Motrin, Ibuprofen, Aleve, Naproxen, or Naprosyn.   If you are taking prescription medication for anxiety, depression, insomnia, muscle spasm, chronic pain, or for attention deficit disorder you are advised that you are at a higher risk of adverse effects with use of narcotics post-op, including narcotic addiction/dependence, depressed breathing, death. If you use non-prescribed substances: alcohol, marijuana, cocaine, heroin, methamphetamines, etc., you are at a higher risk of adverse effects with use of narcotics post-op, including narcotic addiction/dependence, depressed breathing, death. You are advised that taking > 50 morphine milligram equivalents (MME) of narcotic pain medication per day results in twice the risk of overdose or death. For your prescription provided: oxycodone 5 mg - taking more than 6 tablets per day after the first few days of surgery.   5. Physical Therapy: 1-2 times per week for ~12 weeks. Therapy typically starts on post operative Day 3 or 4. You have been provided an order for physical therapy. The therapist will provide home exercises. Please contact our offices if this appointment has not been scheduled.  6. Work: May do light duty/desk job in approximately 2 weeks when off of narcotics, pain is well-controlled, and swelling has decreased if able to function with one arm in sling. Full work may take 6 weeks if light motions and function of both arms is required.  Lifting jobs may require 12 weeks.   7. Post-Op Appointments: Your first post-op appointment will be with Dr. Allena Katz in approximately 2 weeks time.    If you find that they have not been scheduled please call the Orthopaedic Appointment front desk at [phone].                               Tonya Albee, MD [facility] Phone: [phone] Fax: [phone]   REVERSE SHOULDER ARTHROPLASTY REHAB GUIDELINES   These guidelines should be tailored to individual patients based on their rehab goals, age, precautions, quality of repair, etc.  Progression should be based on patient progress and approval by the referring physician.  PHASE 1 - Day 1 through Week 2  GENERAL GUIDELINES AND PRECAUTIONS Sling wear 24/7 except during grooming and home exercises (3 to 5 times daily) Avoid shoulder extension such that the arm is posterior the frontal plane.  When patients recline, a pillow should be placed behind the upper arm and sling should be on.  They should be advised to always be able to see the elbow Avoid combined IR/ADD/EXT, such as hand behind back to prevent dislocation Avoid combined IR and ADD such as reaching across the chest to prevent dislocation No AROM No submersion in pool/water for 4 weeks No weight bearing through operative arm (as in transfers, walker use, etc.)  GOALS Maintain integrity of joint replacement; protect soft tissue healing Increase PROM for elevation to 120 and ER to 30 (will remain the goal for first 6 weeks) Optimize distal UE circulation and muscle activity (elbow, wrist and hand) Instruct in use of sling for proper fit, polar care device for ice application after HEP, signs/symptoms of infection  EXERCISES Active elbow, wrist and hand Passive forward elevation in scapular plane to 90-120 max motion; ER in scapular plane to 30 Active scapular retraction with arms resting in neutral position  CRITERIA TO PROGRESS TO  PHASE 2 Low pain (less than 3/10) with shoulder PROM Healing of incision without signs of infection Clearance by MD to advance after 2 week MD check up  PHASE 2 - 2 weeks - 6 weeks  GENERAL GUIDELINES AND PRECAUTIONS Sling may be removed while at home; worn in community without abduction pillow May use arm for light activities of daily living (such as feeding, brushing teeth, dressing.) with elbow near  the side of the body  and arm in front of the body- no active lifting of the arm May submerge in water (tub, pool, [place], etc.) after 4 weeks Continue to avoid WBing through the operative arm Continue to avoid combined IR/EXT/ADD (hand behind the back) and IR/ADD  (reaching across chest) for dislocation precautions  GOALS  Achieve passive elevation to 120 and ER to 30  Low (less than 3/10) to no pain  Ability to fire all heads of the deltoid  EXERCISES May discontinue grip, and active elbow and wrist exercises since using the arm in ADL's  with sling removed around the home Continue passive elevation to 120 and ER to 30, both in scapular plane with arm supported on table top Add submaximal isometrics, pain free effort, for all  functional heads of deltoid (anterior, posterior, middle)  Ensure that with posterior deltoid isometric the shoulder does not move into extension and the arm remains anterior the frontal plane At 4 weeks:  begin to place arm in balanced position of 90 deg elevation in supine; when patient able to hold this position with ease, may begin reverse pendulums clockwise and counterclockwise  CRITERIA TO PROGRESS TO PHASE 3 Passive forward elevation in scapular plane to 120; passive ER in scapular plane to 30 Ability to fire isometrically all heads of the deltoid muscle without pain Ability to place and hold the arm in balanced position (90 deg elevation in supine)  PHASE 3 - 6 weeks to 3 months  GENERAL GUIDELINES AND PRECAUTIONS Discontinue use of sling Avoid  forcing end range motion in any direction to prevent dislocation  May advance use of the arm actively in ADL's without being restricted to arm by the side of the body, however, avoid heavy lifting and sports (forever!) May initiate functional IR behind the back gently NO UPPER BODY ERGOMETER   GOALS Optimize PROM for elevation and ER in scapular plane with realistic expectation that max  mobility for elevation is usually around 145-160 passively; ER 40 to 50 passively; functional IR to L1 Recover AROM to approach as close to PROM available as possible; may expect 135-150 deg active elevation; 30 deg active ER; active functional IR to L1 Establish dynamic stability of the shoulder with deltoid and periscapular muscle gradual strengthening  EXERCISES Forward elevation in scapular plane active progression: supine to incline, to vertical; short to long lever arm Balanced position long lever arm AROM Active ER/IR with arm at side Scapular retraction with light band resistance Functional IR with hand slide up back - very gentle and gradual NO UPPER BODY ERGOMETER     CRITERIA TO PROGRESS TO PHASE 4  AROM equals/approaches PROM with good mechanics for elevation   No pain  Higher level demand on shoulder than ADL functions   PHASE 4 12 months and beyond  GENERAL GUIDELINES AND PRECAUTIONS No heavy lifting and no overhead sports No heavy pushing activity Gradually increase strength of deltoid and scapular stabilizers; also the rotator cuff if present with weights not to exceed 5 lbs NO UPPER BODY ERGOMETER   GOALS  Optimize functional use of the operative UE to meet the desired demands  Gradual increase in deltoid, scapular muscle, and rotator cuff strength  Pain free functional activities   EXERCISES Add light hand weights for deltoid up to and not to exceed 3 lbs for anterior and posterior with long arm lift against gravity; elbow bent to 90 deg for abduction in scapular  plane Theraband progression for extension to hip with scapular depression/retraction Theraband progression for serratus anterior punches in supine; avoid wall, incline or prone pressups for serratus anterior End range stretching gently without forceful overpressure in all planes (elevation in scapular plane, ER in scapular plane, functional IR) with stretching done for life as part of a daily routine NO UPPER BODY ERGOMETER     CRITERIA FOR DISCHARGE FROM SKILLED PHYSICAL THERAPY  Pain free AROM for shoulder elevation (expect around 135-150)  Functional strength for all ADL's, work tasks, and hobbies approved by Careers adviser  Independence with home maintenance program   NOTES: 1. With proper exercise, motion, strength, and function continue to improve even after one year. 2. The complication rate after surgery is 5 - 8%. Complications include infection, fracture, heterotopic bone formation, nerve injury, instability, rotator cuff  tear, and tuberosity nonunion. Please look for clinical signs, unusual symptoms, or lack of progress with therapy and report those to Dr. Allena Katz. Prefer more communication than less.  3. The therapy plan above only serves as a guide. Please be aware of specific individualized patient instructions as written on the prescription or through discussions with the surgeon. 4. Please call Dr. Allena Katz if you have any specific questions or concerns [phone]

## 2023-10-04 NOTE — Plan of Care (Signed)
  Problem: Education: Goal: Knowledge of General Education information will improve Description Including pain rating scale, medication(s)/side effects and non-pharmacologic comfort measures Outcome: Progressing   Problem: Health Behavior/Discharge Planning: Goal: Ability to manage health-related needs will improve Outcome: Progressing   

## 2023-10-04 NOTE — Evaluation (Signed)
Occupational Therapy Evaluation Patient Details Name: Tonya Cummings MRN: [id number] DOB: 1951/10/27 Today's Date: 10/04/2023   History of Present Illness Pt s/p revision right reverse shoulder arthroplasty with conversion to long humeral stem and open reduction internal fixation of the humerus. PMH includes: headaches, axiety, depression, GERD, renal disease, arthritis, CKD, costochondritis, hip dysplasia, hyperlipidemia, HTN, lumbago, osteoporsosi, rheumatoid arthritis, seasonal allergies, sleep disorder, and thoracic compression fx.   Clinical Impression   Upon entering the room, pt seated in recliner chair and is agreeable to OT intervention. Pt is familiar with precautions, sling use, NWB status, and H/W/E exercises from prior surgery. OT reviewed them once again. Pt needing mod A for UB dressing this session and min A for LB dressing. She endorses living at home with family and husband has been assisting her with self care as needed for the last month and will continue to be available to assist. Paper handout left for spouse to review. Pt does not need further equipment recommendation and education completed. OT to complete order.       If plan is discharge home, recommend the following: A little help with walking and/or transfers;A little help with bathing/dressing/bathroom;Assistance with cooking/housework;Assist for transportation;Help with stairs or ramp for entrance       Equipment Recommendations  None recommended by OT       Precautions / Restrictions Precautions Precautions: Fall;Shoulder Shoulder Interventions: Roe Coombs joy ultra sling;Shoulder abduction pillow;At all times;Off for dressing/bathing/exercises Precaution Booklet Issued: Yes (comment) Restrictions Weight Bearing Restrictions: Yes RUE Weight Bearing: Non weight bearing      Mobility Bed Mobility               General bed mobility comments: seated in recliner chair    Transfers Overall transfer  level: Needs assistance Equipment used: 1 person hand held assist Transfers: Sit to/from Stand Sit to Stand: Contact guard assist                  Balance Overall balance assessment: Needs assistance Sitting-balance support: Feet supported Sitting balance-Leahy Scale: Good     Standing balance support: Single extremity supported Standing balance-Leahy Scale: Fair                             ADL either performed or assessed with clinical judgement   ADL Overall ADL's : Needs assistance/impaired                 Upper Body Dressing : Moderate assistance;Cueing for safety;Cueing for UE precautions   Lower Body Dressing: Moderate assistance;Sit to/from Careers adviser Details (indicate cue type and reason): simulated                 Vision Patient Visual Report: No change from baseline              Pertinent Vitals/Pain Pain Assessment Pain Assessment: Faces Pain Score: 2  Pain Location: R shoulder Pain Descriptors / Indicators: Aching, Discomfort Pain Intervention(s): Monitored during session, Repositioned, Ice applied     Extremity/Trunk Assessment Upper Extremity Assessment Upper Extremity Assessment: Generalized weakness;RUE deficits/detail RUE Deficits / Details: NWB from surgical intervention and in sling   Lower Extremity Assessment Lower Extremity Assessment: Generalized weakness       Communication Communication Communication: No apparent difficulties   Cognition Arousal: Alert Behavior During Therapy: WFL for tasks assessed/performed Overall Cognitive Status: Within Functional Limits for tasks assessed  Home Living Family/patient expects to be discharged to:: Private residence Living Arrangements: Spouse/significant other;Children Available Help at Discharge: Family;Available 24 hours/day Type of Home:  Mobile home Home Access: Stairs to enter Entrance Stairs-Number of Steps: 3 Entrance Stairs-Rails: Can reach both;Right;Left Home Layout: One level     Bathroom Shower/Tub: Producer, television/film/video: Standard Bathroom Accessibility: Yes   Home Equipment: Agricultural consultant (2 wheels);Cane - single point;BSC/3in1          Prior Functioning/Environment Prior Level of Function : Needs assist             Mobility Comments: husband has been helping with ambulation, stair navigation ADLs Comments: husband has been helping with ADLs due to recenty shoulder surgeries                 OT Goals(Current goals can be found in the care plan section) Acute Rehab OT Goals Patient Stated Goal: to go home OT Goal Formulation: With patient Time For Goal Achievement: 10/04/23 Potential to Achieve Goals: Fair  OT Frequency:         AM-PAC OT "6 Clicks" Daily Activity     Outcome Measure Help from another person eating meals?: None Help from another person taking care of personal grooming?: None Help from another person toileting, which includes using toliet, bedpan, or urinal?: A Little Help from another person bathing (including washing, rinsing, drying)?: A Little Help from another person to put on and taking off regular upper body clothing?: A Little Help from another person to put on and taking off regular lower body clothing?: A Little 6 Click Score: 20   End of Session Nurse Communication: Mobility status  Activity Tolerance: Patient tolerated treatment well Patient left: in chair;with call bell/phone within reach;with chair alarm set                   Time: 5784-6962 OT Time Calculation (min): 16 min Charges:  OT General Charges $OT Visit: 1 Visit OT Evaluation $OT Eval Low Complexity: 1 Low OT Treatments $Self Care/Home Management : 8-22 mins  Jackquline Denmark, MS, OTR/L , CBIS ascom [phone]  10/04/23, 12:15 PM

## 2023-10-04 NOTE — TOC Transition Note (Signed)
Transition of Care [facility]) - CM/SW Discharge Note   Patient Details  Name: Tonya Cummings MRN: [id number] Date of Birth: December 09, 1951  [facility] CM/SW Contact:  Margarito Liner, LCSW Phone Number: 10/04/2023, 10:39 AM   Clinical Narrative:   Patient has orders to discharge home today. Readmission prevention screen complete. CSW met with patient. No supports at bedside. CSW introduced role and explained that discharge planning would be discussed. PCP is Lenon Oms, NP. Husband drives her to appointments. She uses the [facility] [employer] and [facility] in [place]. No issues obtaining medications. Patients lives home with husband, daughter, son, two granddaughters, and sometimes her grandson. She has a SPC, rollator, RW, BSC, wheelchair, and shower chair. Per ortho notes, plan for outpatient PT. Patient confirmed this plan. No further concerns. Her husband will pick her up today. CSW signing off.  Final next level of care: OP Rehab Barriers to Discharge: No Barriers Identified   Patient Goals and CMS Choice      Discharge Placement                  Patient to be transferred to facility by: Husband   Patient and family notified of of transfer: 10/04/23  Discharge Plan and Services Additional resources added to the After Visit Summary for                                       Social Determinants of Health (SDOH) Interventions SDOH Screenings   Food Insecurity: No Food Insecurity (10/03/2023)  Housing: Low Risk  (10/03/2023)  Transportation Needs: No Transportation Needs (10/03/2023)  Utilities: Not At Risk (10/03/2023)  Depression (PHQ2-9): Low Risk  (09/02/2022)  Tobacco Use: Low Risk  (10/03/2023)     Readmission Risk Interventions    10/04/2023   10:38 AM 09/02/2023   10:04 AM  Readmission Risk Prevention Plan  Transportation Screening Complete Complete  PCP or Specialist Appt within 3-5 Days Complete Complete  HRI or Home Care  Consult  Complete  Social Work Consult for Recovery Care Planning/Counseling Complete Complete  Palliative Care Screening Not Applicable Not Applicable  Medication Review Oceanographer) Complete Complete

## 2023-10-04 NOTE — Discharge Summary (Signed)
Subjective: 1 Day Post-Op Procedure(s) (LRB): Revision right reverse shoulder arthroplasty with conversion to long humeral stem and open reduction internal fixation of the humerus (Right) Patient reports pain as mild.   Patient is well, and has had no acute complaints or problems Plan is to go Home after hospital stay. Negative for chest pain and shortness of breath Fever: no Gastrointestinal: Negative for nausea and vomiting  Objective: Vital signs in last 24 hours: Temp:  [97.6 F (36.4 C)-98.3 F (36.8 C)] 98.3 F (36.8 C) (10/22 0436) Pulse Rate:  [87-104] 87 (10/22 0436) Resp:  [15-22] 18 (10/22 0436) BP: (102-150)/(44-63) 102/58 (10/22 0436) SpO2:  [96 %-100 %] 100 % (10/22 0436) Weight:  [73.8 kg] 73.8 kg (10/21 1200)  Intake/Output from previous day:  Intake/Output Summary (Last 24 hours) at 10/04/2023 0718 Last data filed at 10/04/2023 0450 Gross per 24 hour  Intake 1650 ml  Output 850 ml  Net 800 ml    Intake/Output this shift: No intake/output data recorded.  Labs: Recent Labs    10/03/23 1001 10/04/23 0420  HGB 8.1* 7.0*   Recent Labs    10/03/23 1001 10/04/23 0420  WBC 9.2 10.7*  RBC 2.72* 2.41*  HCT 25.3* 22.2*  PLT 322 286   Recent Labs    10/03/23 1001 10/04/23 0420  NA 137 137  K 4.4 4.4  CL 100 101  CO2 27 26  BUN 66* 60*  CREATININE 2.24* 2.08*  GLUCOSE 100* 173*  CALCIUM 9.2 8.4*   No results for input(s): "LABPT", "INR" in the last 72 hours.   EXAM General - Patient is Alert and Oriented Extremity - Neurovascular intact Sensation intact distally Dorsiflexion/Plantar flexion intact Dressing/Incision - clean, dry, with the Hemovac tubing removed with no complication. Motor Function - intact, moving fingers well on exam.   Past Medical History:  Diagnosis Date   Acute hypoxemic respiratory failure (HCC)    Anemia in stage 4 chronic kidney disease (HCC)    Anginal pain (HCC)    Anxiety    Aortic atherosclerosis (HCC)     CHF (congestive heart failure) (HCC)    a.) TTE 09/30/2021: EF 55-60%, no RWMAs, mild MR, G1DD   Chronic pain    Chronic radicular pain of lower back    CKD (chronic kidney disease), stage IV (HCC)    Complication of anesthesia    a.) delayed emergence following ureteroscopy 07/2023   Coronary artery calcification seen on CT scan    Costochondritis    DDD (degenerative disc disease), lumbar    Depression    Dyspnea    GERD (gastroesophageal reflux disease)    Hiatal hernia    Hip dysplasia    Hyperlipidemia    Hypertension    Insomnia    Iron deficiency    Iron deficiency anemia    Low back pain    Low vitamin B12 level    Lumbar facet joint pain    Migraines    Nose colonized with MRSA 10/03/2020   a.) preop PCR (+) 10/03/2020 prior to RIGHT TKA; b.) preop PCR (+) 08/25/2023 prior to RIGHT REVERSE SHOULDER ARTHROPLASTY; BICEPS TENODESIS   Numbness and tingling of right leg    OA (osteoarthritis)    Obesity    OSA (obstructive sleep apnea)    a.) not currently utilizing nocturnal PAP therapy; positional. PCCM feels as if symptom can be mitigated with diet/exercise/weight loss unless develops significant symptoms.   Osteoporosis    a.) recieves denosumab injections  Pelvic fracture (HCC)    Pneumonia    Pulmonary fibrosis (HCC)    Raynaud's disease without gangrene    Restless leg syndrome    a.) on pramipexole + oral Fe supplementation   Rheumatoid arthritis (HCC)    a.) Tx'd with hydroxychloroquine   Right renal mass 05/28/2023   a.) CT renal 05/28/2023:  5.7 cm mass in the medial aspect of right kidney   Seasonal allergies    Thoracic compression fracture (HCC)     Assessment/Plan: 1 Day Post-Op Procedure(s) (LRB): Revision right reverse shoulder arthroplasty with conversion to long humeral stem and open reduction internal fixation of the humerus (Right) Principal Problem:   Periprosthetic fracture around internal prosthetic right shoulder joint  Estimated  body mass index is 28.82 kg/m as calculated from the following:   Height as of this encounter: 5\' 3"  (1.6 m).   Weight as of this encounter: 73.8 kg. Advance diet Up with therapy  Acute blood loss anemia post right reverse shoulder fracture with revision.  Hemoglobin 7.0.  Transfuse 1 unit packed red blood cells.  Plan to discharge after physical therapy today once transfusion is complete and recheck labs.  Discharge home with outpatient physical therapy.  DVT Prophylaxis - Aspirin Shoulder immobilizer to right arm at all times.  Dedra Skeens, PA-C Orthopaedic Surgery 10/04/2023, 7:18 AM

## 2023-10-04 NOTE — Progress Notes (Addendum)
0730 Awaiting for 1 unit PRBC to be allocated by blood bank  1345 Blood transfusion completed. VSS. Pt ready for d/c  1358 Pt offered eye drops for right eye prior to d/c pt states she will take her own at home.

## 2023-10-04 NOTE — Progress Notes (Signed)
Subjective: 1 Day Post-Op Procedure(s) (LRB): Revision right reverse shoulder arthroplasty with conversion to long humeral stem and open reduction internal fixation of the humerus (Right) Patient reports pain as mild.   Patient is well, and has had no acute complaints or problems Plan is to go Home after hospital stay. Negative for chest pain and shortness of breath Fever: no Gastrointestinal: Negative for nausea and vomiting  Objective: Vital signs in last 24 hours: Temp:  [97.6 F (36.4 C)-98.3 F (36.8 C)] 98.3 F (36.8 C) (10/22 0436) Pulse Rate:  [87-104] 87 (10/22 0436) Resp:  [15-22] 18 (10/22 0436) BP: (102-150)/(44-63) 102/58 (10/22 0436) SpO2:  [96 %-100 %] 100 % (10/22 0436) Weight:  [73.8 kg] 73.8 kg (10/21 1200)  Intake/Output from previous day:  Intake/Output Summary (Last 24 hours) at 10/04/2023 0981 Last data filed at 10/04/2023 0450 Gross per 24 hour  Intake 1650 ml  Output 850 ml  Net 800 ml    Intake/Output this shift: No intake/output data recorded.  Labs: Recent Labs    10/03/23 1001 10/04/23 0420  HGB 8.1* 7.0*   Recent Labs    10/03/23 1001 10/04/23 0420  WBC 9.2 10.7*  RBC 2.72* 2.41*  HCT 25.3* 22.2*  PLT 322 286   Recent Labs    10/03/23 1001 10/04/23 0420  NA 137 137  K 4.4 4.4  CL 100 101  CO2 27 26  BUN 66* 60*  CREATININE 2.24* 2.08*  GLUCOSE 100* 173*  CALCIUM 9.2 8.4*   No results for input(s): "LABPT", "INR" in the last 72 hours.   EXAM General - Patient is Alert and Oriented Extremity - Neurovascular intact Sensation intact distally Dorsiflexion/Plantar flexion intact Dressing/Incision - clean, dry, with the Hemovac removed with no complication Motor Function - intact, moving fingers and wrist well on exam.   Past Medical History:  Diagnosis Date   Acute hypoxemic respiratory failure (HCC)    Anemia in stage 4 chronic kidney disease (HCC)    Anginal pain (HCC)    Anxiety    Aortic atherosclerosis (HCC)     CHF (congestive heart failure) (HCC)    a.) TTE 09/30/2021: EF 55-60%, no RWMAs, mild MR, G1DD   Chronic pain    Chronic radicular pain of lower back    CKD (chronic kidney disease), stage IV (HCC)    Complication of anesthesia    a.) delayed emergence following ureteroscopy 07/2023   Coronary artery calcification seen on CT scan    Costochondritis    DDD (degenerative disc disease), lumbar    Depression    Dyspnea    GERD (gastroesophageal reflux disease)    Hiatal hernia    Hip dysplasia    Hyperlipidemia    Hypertension    Insomnia    Iron deficiency    Iron deficiency anemia    Low back pain    Low vitamin B12 level    Lumbar facet joint pain    Migraines    Nose colonized with MRSA 10/03/2020   a.) preop PCR (+) 10/03/2020 prior to RIGHT TKA; b.) preop PCR (+) 08/25/2023 prior to RIGHT REVERSE SHOULDER ARTHROPLASTY; BICEPS TENODESIS   Numbness and tingling of right leg    OA (osteoarthritis)    Obesity    OSA (obstructive sleep apnea)    a.) not currently utilizing nocturnal PAP therapy; positional. PCCM feels as if symptom can be mitigated with diet/exercise/weight loss unless develops significant symptoms.   Osteoporosis    a.) recieves denosumab injections  Pelvic fracture (HCC)    Pneumonia    Pulmonary fibrosis (HCC)    Raynaud's disease without gangrene    Restless leg syndrome    a.) on pramipexole + oral Fe supplementation   Rheumatoid arthritis (HCC)    a.) Tx'd with hydroxychloroquine   Right renal mass 05/28/2023   a.) CT renal 05/28/2023:  5.7 cm mass in the medial aspect of right kidney   Seasonal allergies    Thoracic compression fracture (HCC)     Assessment/Plan: 1 Day Post-Op Procedure(s) (LRB): Revision right reverse shoulder arthroplasty with conversion to long humeral stem and open reduction internal fixation of the humerus (Right) Principal Problem:   Periprosthetic fracture around internal prosthetic right shoulder joint  Estimated  body mass index is 28.82 kg/m as calculated from the following:   Height as of this encounter: 5\' 3"  (1.6 m).   Weight as of this encounter: 73.8 kg. Advance diet Up with therapy  Acute blood loss anemia after right reverse shoulder revision.  Hemoglobin 7.0 this morning.  Transfuse 1 unit of packed red blood cells with recheck hemoglobin.  Plan to discharge after transfusion.  Physical therapy after transfusion.  DVT Prophylaxis - Aspirin Shoulder immobilizer to right arm at all times.  Remove for bathing and physical therapy  Dedra Skeens, PA-C Orthopaedic Surgery 10/04/2023, 7:12 AM

## 2023-10-05 LAB — TYPE AND SCREEN
ABO/RH(D): O POS
Antibody Screen: NEGATIVE
Unit division: 0

## 2023-10-05 LAB — BPAM RBC
Blood Product Expiration Date: 202411192359
ISSUE DATE / TIME: 202410221027
Unit Type and Rh: 5100

## 2023-10-05 LAB — SURGICAL PATHOLOGY

## 2023-10-06 ENCOUNTER — Encounter: Payer: Self-pay | Admitting: Orthopedic Surgery

## 2023-10-06 DIAGNOSIS — Z79899 Other long term (current) drug therapy: Secondary | ICD-10-CM | POA: Diagnosis not present

## 2023-10-06 DIAGNOSIS — K219 Gastro-esophageal reflux disease without esophagitis: Secondary | ICD-10-CM | POA: Diagnosis not present

## 2023-10-06 DIAGNOSIS — Z1339 Encounter for screening examination for other mental health and behavioral disorders: Secondary | ICD-10-CM | POA: Diagnosis not present

## 2023-10-06 DIAGNOSIS — Z23 Encounter for immunization: Secondary | ICD-10-CM | POA: Diagnosis not present

## 2023-10-06 LAB — AEROBIC/ANAEROBIC CULTURE W GRAM STAIN (SURGICAL/DEEP WOUND): Gram Stain: NONE SEEN

## 2023-10-08 LAB — AEROBIC/ANAEROBIC CULTURE W GRAM STAIN (SURGICAL/DEEP WOUND)
Culture: NO GROWTH
Gram Stain: NONE SEEN

## 2023-10-09 LAB — AEROBIC/ANAEROBIC CULTURE W GRAM STAIN (SURGICAL/DEEP WOUND)
Culture: NO GROWTH
Culture: NO GROWTH
Culture: NO GROWTH
Culture: NO GROWTH
Gram Stain: NONE SEEN
Gram Stain: NONE SEEN
Gram Stain: NONE SEEN
Gram Stain: NONE SEEN

## 2023-10-10 DIAGNOSIS — Z96611 Presence of right artificial shoulder joint: Secondary | ICD-10-CM | POA: Diagnosis not present

## 2023-10-10 DIAGNOSIS — M6281 Muscle weakness (generalized): Secondary | ICD-10-CM | POA: Diagnosis not present

## 2023-10-10 DIAGNOSIS — M25511 Pain in right shoulder: Secondary | ICD-10-CM | POA: Diagnosis not present

## 2023-10-10 DIAGNOSIS — M25611 Stiffness of right shoulder, not elsewhere classified: Secondary | ICD-10-CM | POA: Diagnosis not present

## 2023-10-11 ENCOUNTER — Ambulatory Visit
Admission: RE | Admit: 2023-10-11 | Discharge: 2023-10-11 | Disposition: A | Discharge status: Home / Self Care | Payer: Medicare HMO | Source: Ambulatory Visit | Attending: Urology | Admitting: Urology

## 2023-10-11 DIAGNOSIS — Z9049 Acquired absence of other specified parts of digestive tract: Secondary | ICD-10-CM | POA: Diagnosis not present

## 2023-10-11 DIAGNOSIS — K449 Diaphragmatic hernia without obstruction or gangrene: Secondary | ICD-10-CM | POA: Diagnosis not present

## 2023-10-11 DIAGNOSIS — N281 Cyst of kidney, acquired: Secondary | ICD-10-CM | POA: Diagnosis not present

## 2023-10-11 DIAGNOSIS — N2889 Other specified disorders of kidney and ureter: Secondary | ICD-10-CM | POA: Insufficient documentation

## 2023-10-17 DIAGNOSIS — M9731XD Periprosthetic fracture around internal prosthetic right shoulder joint, subsequent encounter: Secondary | ICD-10-CM | POA: Diagnosis not present

## 2023-10-20 ENCOUNTER — Encounter: Payer: Self-pay | Admitting: Urology

## 2023-10-24 ENCOUNTER — Encounter: Payer: Self-pay | Admitting: Urology

## 2023-10-31 ENCOUNTER — Other Ambulatory Visit: Payer: Self-pay

## 2023-10-31 DIAGNOSIS — D631 Anemia in chronic kidney disease: Secondary | ICD-10-CM

## 2023-11-01 ENCOUNTER — Inpatient Hospital Stay (HOSPITAL_BASED_OUTPATIENT_CLINIC_OR_DEPARTMENT_OTHER): Payer: Medicare HMO | Admitting: Oncology

## 2023-11-01 ENCOUNTER — Inpatient Hospital Stay: Payer: Medicare HMO | Attending: Oncology

## 2023-11-01 ENCOUNTER — Encounter: Payer: Self-pay | Admitting: Oncology

## 2023-11-01 VITALS — BP 132/59 | HR 88 | Temp 98.6°F | Resp 19 | Wt 167.3 lb

## 2023-11-01 DIAGNOSIS — N184 Chronic kidney disease, stage 4 (severe): Secondary | ICD-10-CM | POA: Diagnosis not present

## 2023-11-01 DIAGNOSIS — Z79899 Other long term (current) drug therapy: Secondary | ICD-10-CM | POA: Diagnosis not present

## 2023-11-01 DIAGNOSIS — N2889 Other specified disorders of kidney and ureter: Secondary | ICD-10-CM | POA: Insufficient documentation

## 2023-11-01 DIAGNOSIS — Z888 Allergy status to other drugs, medicaments and biological substances status: Secondary | ICD-10-CM | POA: Diagnosis not present

## 2023-11-01 DIAGNOSIS — D509 Iron deficiency anemia, unspecified: Secondary | ICD-10-CM

## 2023-11-01 DIAGNOSIS — D631 Anemia in chronic kidney disease: Secondary | ICD-10-CM

## 2023-11-01 DIAGNOSIS — M47816 Spondylosis without myelopathy or radiculopathy, lumbar region: Secondary | ICD-10-CM | POA: Diagnosis not present

## 2023-11-01 DIAGNOSIS — Z8249 Family history of ischemic heart disease and other diseases of the circulatory system: Secondary | ICD-10-CM | POA: Diagnosis not present

## 2023-11-01 DIAGNOSIS — I130 Hypertensive heart and chronic kidney disease with heart failure and stage 1 through stage 4 chronic kidney disease, or unspecified chronic kidney disease: Secondary | ICD-10-CM | POA: Insufficient documentation

## 2023-11-01 DIAGNOSIS — E611 Iron deficiency: Secondary | ICD-10-CM | POA: Insufficient documentation

## 2023-11-01 DIAGNOSIS — Z8269 Family history of other diseases of the musculoskeletal system and connective tissue: Secondary | ICD-10-CM | POA: Diagnosis not present

## 2023-11-01 DIAGNOSIS — Z833 Family history of diabetes mellitus: Secondary | ICD-10-CM | POA: Insufficient documentation

## 2023-11-01 DIAGNOSIS — Z9049 Acquired absence of other specified parts of digestive tract: Secondary | ICD-10-CM | POA: Diagnosis not present

## 2023-11-01 DIAGNOSIS — Z820 Family history of epilepsy and other diseases of the nervous system: Secondary | ICD-10-CM | POA: Insufficient documentation

## 2023-11-01 DIAGNOSIS — N281 Cyst of kidney, acquired: Secondary | ICD-10-CM | POA: Diagnosis not present

## 2023-11-01 DIAGNOSIS — I509 Heart failure, unspecified: Secondary | ICD-10-CM | POA: Insufficient documentation

## 2023-11-01 DIAGNOSIS — Z886 Allergy status to analgesic agent status: Secondary | ICD-10-CM | POA: Diagnosis not present

## 2023-11-01 DIAGNOSIS — Z809 Family history of malignant neoplasm, unspecified: Secondary | ICD-10-CM | POA: Insufficient documentation

## 2023-11-01 DIAGNOSIS — E785 Hyperlipidemia, unspecified: Secondary | ICD-10-CM | POA: Insufficient documentation

## 2023-11-01 DIAGNOSIS — F32A Depression, unspecified: Secondary | ICD-10-CM | POA: Diagnosis not present

## 2023-11-01 DIAGNOSIS — Z85528 Personal history of other malignant neoplasm of kidney: Secondary | ICD-10-CM | POA: Diagnosis not present

## 2023-11-01 DIAGNOSIS — Z7982 Long term (current) use of aspirin: Secondary | ICD-10-CM | POA: Diagnosis not present

## 2023-11-01 LAB — CBC (CANCER CENTER ONLY)
HCT: 24.9 % — ABNORMAL LOW (ref 36.0–46.0)
Hemoglobin: 7.5 g/dL — ABNORMAL LOW (ref 12.0–15.0)
MCH: 27.9 pg (ref 26.0–34.0)
MCHC: 30.1 g/dL (ref 30.0–36.0)
MCV: 92.6 fL (ref 80.0–100.0)
Platelet Count: 334 10*3/uL (ref 150–400)
RBC: 2.69 MIL/uL — ABNORMAL LOW (ref 3.87–5.11)
RDW: 15.4 % (ref 11.5–15.5)
WBC Count: 11.7 10*3/uL — ABNORMAL HIGH (ref 4.0–10.5)
nRBC: 0 % (ref 0.0–0.2)

## 2023-11-01 LAB — IRON AND TIBC
Iron: 16 ug/dL — ABNORMAL LOW (ref 28–170)
Saturation Ratios: 7 % — ABNORMAL LOW (ref 10.4–31.8)
TIBC: 242 ug/dL — ABNORMAL LOW (ref 250–450)
UIBC: 226 ug/dL

## 2023-11-01 LAB — FERRITIN: Ferritin: 139 ng/mL (ref 11–307)

## 2023-11-01 NOTE — Progress Notes (Signed)
Hematology/Oncology Consult note [facility]  [phone] [phone]  Patient Care Team: Myrene Buddy, NP as PCP - General (Internal Medicine) Antonieta Iba, MD as PCP - Cardiology (Cardiology) Caryl Ada, MD as Referring Physician (Gastroenterology) Dayna Barker, MD as Referring Physician (Internal Medicine) Creig Hines, MD as Consulting Physician (Hematology and Oncology) Vida Rigger, MD as Consulting Physician (Pulmonary Disease) Signa Kell, MD as Consulting Physician (Orthopedic Surgery)   Name of the patient: Tonya Cummings  [id number]  1951/11/02   Date of visit: 11/01/23  Diagnosis-anemia of chronic kidney disease  Chief complaint/ Reason for visit-routine follow-up of anemia  Heme/Onc history: Patient is a 72 year old female with a history of seropositive rheumatoid arthritis for which she is on Plaquenil methotrexate and Humira.  She was seen by Dr. Merlene Pulling back in September 2020 when her hemoglobin was 8 and she had conclusive evidence of iron deficiency with a ferritin level of 10 and iron saturation of 6%.  She received Venofer back then and her hemoglobin improved to 10 and has remained around that range since then.Colonoscopy on 08/15/2020 revealed one 5 mm polyp in the proximal ascending colon (tubular adenoma). There was diverticulosis in the sigmoid colon. EGD was normal.  B12 and folate have been normal in the past.  She has a history of IgG lambda MGUS is being monitored   She has stage III CKD and follows up with Dr. Cherylann Ratel.  Also noted to have right renal mass 5.7 x 4.3 cm and after discussing pros and cons of surgery with urology patient has elected for conservative monitoring of this mass  Interval history- Patient underwent reverse shoulder arthroplasty of the right side as well as internal fixation of the right humerus following a fracture in October 2024.  She reports chronic  fatigue.  Denies any new complaints at this time  ECOG PS- 2 Pain scale- 3   Review of systems- Review of Systems  Constitutional:  Negative for chills, fever, malaise/fatigue and weight loss.  HENT:  Negative for congestion, ear discharge and nosebleeds.   Eyes:  Negative for blurred vision.  Respiratory:  Negative for cough, hemoptysis, sputum production, shortness of breath and wheezing.   Cardiovascular:  Negative for chest pain, palpitations, orthopnea and claudication.  Gastrointestinal:  Negative for abdominal pain, blood in stool, constipation, diarrhea, heartburn, melena, nausea and vomiting.  Genitourinary:  Negative for dysuria, flank pain, frequency, hematuria and urgency.  Musculoskeletal:  Negative for back pain, joint pain and myalgias.  Skin:  Negative for rash.  Neurological:  Negative for dizziness, tingling, focal weakness, seizures, weakness and headaches.  Endo/Heme/Allergies:  Does not bruise/bleed easily.  Psychiatric/Behavioral:  Negative for depression and suicidal ideas. The patient does not have insomnia.       Allergies  Allergen Reactions   Gabapentin Other (See Comments)    Weight gain   Ibuprofen Other (See Comments)    Headache     Past Medical History:  Diagnosis Date   Acute hypoxemic respiratory failure (HCC)    Anemia in stage 4 chronic kidney disease (HCC)    Anginal pain (HCC)    Anxiety    Aortic atherosclerosis (HCC)    CHF (congestive heart failure) (HCC)    a.) TTE 09/30/2021: EF 55-60%, no RWMAs, mild MR, G1DD   Chronic pain    Chronic radicular pain of lower back    CKD (chronic kidney disease), stage IV (HCC)    Complication of anesthesia  a.) delayed emergence following ureteroscopy 07/2023   Coronary artery calcification seen on CT scan    Costochondritis    DDD (degenerative disc disease), lumbar    Depression    Dyspnea    GERD (gastroesophageal reflux disease)    Hiatal hernia    Hip dysplasia    Hyperlipidemia     Hypertension    Insomnia    Iron deficiency    Iron deficiency anemia    Low back pain    Low vitamin B12 level    Lumbar facet joint pain    Migraines    Nose colonized with MRSA 10/03/2020   a.) preop PCR (+) 10/03/2020 prior to RIGHT TKA; b.) preop PCR (+) 08/25/2023 prior to RIGHT REVERSE SHOULDER ARTHROPLASTY; BICEPS TENODESIS   Numbness and tingling of right leg    OA (osteoarthritis)    Obesity    OSA (obstructive sleep apnea)    a.) not currently utilizing nocturnal PAP therapy; positional. PCCM feels as if symptom can be mitigated with diet/exercise/weight loss unless develops significant symptoms.   Osteoporosis    a.) recieves denosumab injections   Pelvic fracture (HCC)    Pneumonia    Pulmonary fibrosis (HCC)    Raynaud's disease without gangrene    Restless leg syndrome    a.) on pramipexole + oral Fe supplementation   Rheumatoid arthritis (HCC)    a.) Tx'd with hydroxychloroquine   Right renal mass 05/28/2023   a.) CT renal 05/28/2023:  5.7 cm mass in the medial aspect of right kidney   Seasonal allergies    Thoracic compression fracture [facility])      Past Surgical History:  Procedure Laterality Date   ABDOMINAL SURGERY     pt denies   APPENDECTOMY     CARPAL TUNNEL RELEASE Bilateral    CHOLECYSTECTOMY     COLONOSCOPY WITH PROPOFOL N/A 08/15/2020   Procedure: COLONOSCOPY WITH PROPOFOL;  Surgeon: Earline Mayotte, MD;  Location: [facility] ENDOSCOPY;  Service: Endoscopy;  Laterality: N/A;   CYSTOSCOPY W/ RETROGRADES Right 07/18/2023   Procedure: CYSTOSCOPY WITH RETROGRADE PYELOGRAM;  Surgeon: [name], MD;  Location: [facility] ORS;  Service: Urology;  Laterality: Right;   DILATION AND CURETTAGE OF UTERUS     ESOPHAGOGASTRODUODENOSCOPY (EGD) WITH PROPOFOL N/A 08/15/2020   Procedure: ESOPHAGOGASTRODUODENOSCOPY (EGD) WITH PROPOFOL;  Surgeon: Earline Mayotte, MD;  Location: [facility] ENDOSCOPY;  Service: Endoscopy;  Laterality: N/A;   FRACTURE SURGERY     hip fracture     FRACTURE SURGERY     pelvic fracture plate    HIP SURGERY Left    KNEE ARTHROPLASTY Right 10/13/2020   Procedure: COMPUTER ASSISTED TOTAL KNEE ARTHROPLASTY - RNFA;  Surgeon: Donato Heinz, MD;  Location: [facility] ORS;  Service: Orthopedics;  Laterality: Right;   REVERSE SHOULDER ARTHROPLASTY Right 08/30/2023   Procedure: Right reverse shoulder arthroplasty, biceps tenodesis;  Surgeon: Signa Kell, MD;  Location: [facility] ORS;  Service: Orthopedics;  Laterality: Right;   TOTAL SHOULDER REVISION Right 10/03/2023   Procedure: Revision right reverse shoulder arthroplasty with conversion to long humeral stem and open reduction internal fixation of the humerus;  Surgeon: Signa Kell, MD;  Location: [facility] ORS;  Service: Orthopedics;  Laterality: Right;   TUBAL LIGATION     URETEROSCOPY  07/18/2023   Procedure: DIAGNOSTIC URETEROSCOPY;  Surgeon: [name], MD;  Location: [facility] ORS;  Service: Urology;;    Social History   Socioeconomic History   Marital status: Married    Spouse name: Molly Maduro  [phone]  [phone]   Number of children: Not on file   Years of education: Not on file   Highest education level: Not on file  Occupational History   Not on file  Tobacco Use   Smoking status: Never   Smokeless tobacco: Never  Vaping Use   Vaping status: Never Used  Substance and Sexual Activity   Alcohol use: No   Drug use: No   Sexual activity: Yes  Other Topics Concern   Not on file  Social History Narrative   Not on file   Social Determinants of Health   Financial Resource Strain: Not on file  Food Insecurity: No Food Insecurity (10/03/2023)   Hunger Vital Sign    Worried About Running Out of Food in the Last Year: Never true    Ran Out of Food in the Last Year: Never true  Transportation Needs: No Transportation Needs (10/03/2023)   PRAPARE - Administrator, Civil Service (Medical): No    Lack of Transportation (Non-Medical): No  Physical Activity: Not on file  Stress: Not on  file  Social Connections: Not on file  Intimate Partner Violence: Not At Risk (10/03/2023)   Humiliation, Afraid, Rape, and Kick questionnaire    Fear of Current or Ex-Partner: No    Emotionally Abused: No    Physically Abused: No    Sexually Abused: No    Family History  Problem Relation Age of Onset   Diabetes Mother    Hypertension Mother    Aneurysm Father    Diabetes Son    Seizures Son    Osteosarcoma Brother    Cancer Brother    Diabetes Brother    Diabetes Maternal Grandfather    Heart disease Maternal Grandfather      Current Outpatient Medications:    acetaminophen (TYLENOL) 500 MG tablet, Take 2 tablets (1,000 mg total) by mouth every 6 (six) hours as needed., Disp: 30 tablet, Rfl: 0   aspirin EC 325 MG tablet, Take 1 tablet (325 mg total) by mouth daily., Disp: 60 tablet, Rfl: 0   calcitRIOL (ROCALTROL) 0.25 MCG capsule, Take 0.25 mcg by mouth every morning., Disp: , Rfl:    Calcium Carb-Cholecalciferol 600-400 MG-UNIT CAPS, Take 1 capsule by mouth 3 (three) times daily., Disp: , Rfl:    chlorhexidine (HIBICLENS) 4 % external liquid, Apply 15 mLs (1 Application total) topically as directed for 30 doses. Use as directed daily for 5 days every other week for 6 weeks., Disp: 946 mL, Rfl: 1   Cholecalciferol 50 MCG (2000 UT) CAPS, Take 2,000 Units by mouth every morning., Disp: , Rfl:    cyclobenzaprine (FLEXERIL) 10 MG tablet, Take 10 mg by mouth at bedtime., Disp: , Rfl:    enalapril-hydrochlorothiazide (VASERETIC) 10-25 MG tablet, Take 1 tablet by mouth at bedtime., Disp: 90 tablet, Rfl: 3   ferrous sulfate 325 (65 FE) MG tablet, Take 325 mg by mouth daily with breakfast., Disp: , Rfl:    furosemide (LASIX) 40 MG tablet, Take 1.5 tablets (60 mg total) by mouth every morning. Increase to 1.5 tablet (60 mg total) by mouth in morning and extra 1 tablet (40 mg total for maximum daily dose 100 mg) at lunch time as needed for up to 3 days for increased leg swelling, shortness  of breath, weight gain 5+ lbs over 1-2 days. Seek medical care if these symptoms are not improving with increased dose., Disp: 90 tablet, Rfl: 0   hydroxychloroquine (PLAQUENIL) 200 MG tablet, Take  1 tablet by mouth 2 (two) times daily., Disp: , Rfl:    mirtazapine (REMERON) 30 MG tablet, Take 1 tablet by mouth at bedtime., Disp: , Rfl:    Multiple Vitamins-Minerals (MULTIVITAMIN ADULT PO), Take 1 tablet by mouth every morning., Disp: , Rfl:    mupirocin ointment (BACTROBAN) 2 %, Apply small about inside of both nostrils TWICE a day for the next 5 days., Disp: 15 g, Rfl: 0   mupirocin ointment (BACTROBAN) 2 %, Place 1 Application into the nose 2 (two) times daily for 60 doses. Use as directed 2 times daily for 5 days every other week for 6 weeks., Disp: 60 g, Rfl: 0   omeprazole (PRILOSEC) 40 MG capsule, Take 40 mg by mouth every morning., Disp: , Rfl:    ondansetron (ZOFRAN) 4 MG tablet, Take 1 tablet (4 mg total) by mouth every 6 (six) hours as needed for nausea., Disp: 30 tablet, Rfl: 0   oxyCODONE (OXY IR/ROXICODONE) 5 MG immediate release tablet, Take 1 tablet (5 mg total) by mouth every 4 (four) hours as needed for moderate pain (pain score 4-6) (pain score 4-6)., Disp: 30 tablet, Rfl: 0   Pirfenidone 267 MG TABS, Take 3 tablets by mouth 3 (three) times daily., Disp: , Rfl:    pramipexole (MIRAPEX) 0.125 MG tablet, Take 0.125 mg by mouth daily. Take 0.125 mg in the morning and 2 at bedtime, Disp: , Rfl:    PROLIA 60 MG/ML SOSY injection, Inject 60 mg into the skin every 6 (six) months. (Patient not taking: Reported on 09/12/2023), Disp: , Rfl:    rosuvastatin (CRESTOR) 5 MG tablet, Take 5 mg by mouth at bedtime. Take every other night, Disp: , Rfl:    spironolactone (ALDACTONE) 25 MG tablet, Take 25 mg by mouth every morning., Disp: , Rfl:    venlafaxine XR (EFFEXOR-XR) 150 MG 24 hr capsule, Take 150 mg by mouth daily with breakfast. Take with 75 mg for total 225 mg daily, Disp: , Rfl:     venlafaxine XR (EFFEXOR-XR) 75 MG 24 hr capsule, Take 75 mg by mouth daily with breakfast.  TAKE 1 CAPSULE ONE TIME DAILY WITH 150 MG FOR TOTAL DAILY DOSE OF 225 MG, Disp: , Rfl:    vitamin B-12 (CYANOCOBALAMIN) 500 MCG tablet, Take 500 mcg by mouth every morning., Disp: , Rfl:   Physical exam:  Vitals:   11/01/23 1011  BP: (!) 132/59  Pulse: 88  Resp: 19  Temp: 98.6 F (37 C)  TempSrc: Tympanic  SpO2: 97%  Weight: 167 lb 4.8 oz (75.9 kg)   Physical Exam Cardiovascular:     Rate and Rhythm: Normal rate and regular rhythm.     Heart sounds: Normal heart sounds.  Pulmonary:     Effort: Pulmonary effort is normal.     Breath sounds: Normal breath sounds.  Musculoskeletal:     Comments: Right shoulder is in a brace  Skin:    General: Skin is warm and dry.  Neurological:     Mental Status: She is alert and oriented to person, place, and time.         Latest Ref Rng & Units 10/04/2023    4:20 AM  CMP  Glucose 70 - 99 mg/dL 914   BUN 8 - 23 mg/dL 60   Creatinine 7.82 - 1.00 mg/dL 9.56   Sodium 213 - 086 mmol/L 137   Potassium 3.5 - 5.1 mmol/L 4.4   Chloride 98 - 111 mmol/L 101   CO2  22 - 32 mmol/L 26   Calcium 8.9 - 10.3 mg/dL 8.4       Latest Ref Rng & Units 11/01/2023    9:18 AM  CBC  WBC 4.0 - 10.5 K/uL 11.7   Hemoglobin 12.0 - 15.0 g/dL 7.5   Hematocrit 78.4 - 46.0 % 24.9   Platelets 150 - 400 K/uL 334     No images are attached to the encounter.  MR Abdomen Wo Contrast  Result Date: 10/25/2023 CLINICAL DATA:  Follow-up renal mass EXAM: MRI ABDOMEN WITHOUT CONTRAST TECHNIQUE: Multiplanar multisequence MR imaging was performed without the administration of intravenous contrast. COMPARISON:  MRI abdomen dated 06/09/2023. CT abdomen/pelvis dated 05/28/2023. FINDINGS: Lower chest: Lung bases are clear. Hepatobiliary: Liver is within normal limits on unenhanced MR. Enhancing lesions on prior MRI are not evident in the absence of intravenous contrast administration.  Status post cholecystectomy. No intrahepatic ductal dilatation. Common duct measures 11 mm and smoothly tapers at the ampulla, likely postsurgical. Pancreas:  Within normal limits. Spleen:  Within normal limits. Adrenals/Urinary Tract:  Adrenal glands are within normal limits. 5.9 x 4.3 cm mass in the interpolar right kidney (series 4/image 82), grossly unchanged. Differential considerations include renal cell carcinoma versus urothelial carcinoma. Additional small bilateral renal cysts measuring up to 14 mm (series 4/image 25). No hydronephrosis. Stomach/Bowel: Stomach is notable for a large hiatal hernia/inverted intrathoracic stomach. Visualized bowel is grossly unremarkable. Vascular/Lymphatic:  No evidence of abdominal aortic aneurysm. No suspicious abdominal lymphadenopathy. Other:  No abdominal ascites. Musculoskeletal: Degenerative changes of the lumbar spine with mild dextroscoliosis. IMPRESSION: 5.9 cm mass in the interpolar right kidney, grossly unchanged. Differential considerations include renal cell carcinoma versus urothelial carcinoma. Enhancing lesions on prior MRI are not evident in the absence of intravenous contrast administration. Additional stable ancillary findings as above. Electronically Signed   By: Charline Bills M.D.   On: 10/25/2023 02:27   DG Shoulder Right  Result Date: 10/03/2023 CLINICAL DATA:  Revision of right shoulder arthroplasty with conversion along humeral stem and ORIF of humerus. EXAM: RIGHT SHOULDER - 2+ VIEW COMPARISON:  AP right shoulder 08/30/2023 FINDINGS: Images were performed intraoperatively without the presence of a radiologist. Interval removal of the prior humeral prosthesis with short stem. On the second image there is placement of a humeral prosthesis with longer stem and 3 proximal cerclage wires. Normal frontal glenohumeral prosthetic alignment. Total fluoroscopy images: 2 Total fluoroscopy time: 17 seconds Total dose: Radiation Exposure Index (as  provided by the fluoroscopic device): 1.54 mGy air Kerma Please see intraoperative findings for further detail. IMPRESSION: Intraoperative fluoroscopic guidance for revision of right humeral prosthesis. Electronically Signed   By: Neita Garnet M.D.   On: 10/03/2023 17:31   DG Shoulder Right Port  Result Date: 10/03/2023 CLINICAL DATA:  Status post right shoulder replacement. EXAM: RIGHT SHOULDER - 1 VIEW COMPARISON:  Right shoulder radiographs 08/30/2023 and 05/28/2023 FINDINGS: Interval revision of the prior reverse total right shoulder arthroplasty. There is a new humeral component with longer stem. There are 3 new proximal humeral cerclage wires. The glenoid component is likely unchanged. There are two surgical skin staples overlying the coracoid process. There is 2 mm proximal lateral humeral cortical step-off that appears to represent an acute fracture. A surgical drain overlies the proximal lateral right upper arm. IMPRESSION: 1. Interval revision of the prior reverse total right shoulder arthroplasty. There is a new humeral component with longer stem. 2. There is 2 mm proximal lateral humeral cortical step-off that appears to  represent an acute fracture, presumably a periprosthetic fracture following recent placement of the prior shorter humeral stem component that has now been removed. Electronically Signed   By: Neita Garnet M.D.   On: 10/03/2023 17:30   DG C-Arm 1-60 Min-No Report  Result Date: 10/03/2023 Fluoroscopy was utilized by the requesting physician.  No radiographic interpretation.   DG C-Arm 1-60 Min-No Report  Result Date: 10/03/2023 Fluoroscopy was utilized by the requesting physician.  No radiographic interpretation.   [place] OR NERVE BLOCK-IMAGE ONLY [facility])  Result Date: 10/03/2023 There is no interpretation for this exam.  This order is for images obtained during a surgical procedure.  Please See "Surgeries" Tab for more information regarding the procedure.      Assessment and plan- Patient is a 72 y.o. female here for routine follow-up of anemia of chronic kidney disease  Patient's hemoglobin has been mostly between 8-9 up until July 2024 and workup so far has been consistent with anemia of chronic kidney disease.  She would have benefited from EPO to keep her hemoglobin between 10-11 however patient was concerned about the the co-pay associated with EPO and also the fact that people can potentially be associated with growth of her existing renal cell carcinoma and therefore opted not to go for EPO.  More recently her hemoglobin dropped down to 7 after her shoulder surgery and is presently at 7.5.  Ferritin And studies did come back after the patient had left the clinic and her ferritin levels are 139 but iron saturation is low at 7%.  It would be reasonable to offer her IV iron to keep iron saturation closer to 20% and we will call her and see if she would be interested in coming for IV iron.  She would like to think about proceeding with EPO or not following IV iron  CBC ferritin iron studies in 3 and 6 months and I will see her back in 6 months   Visit Diagnosis 1. Anemia in stage 4 chronic kidney disease (HCC)   2. Iron deficiency anemia, unspecified iron deficiency anemia type      Dr. Owens Shark, MD, MPH [facility] at [facility] [phone] 11/01/2023 3:18 PM

## 2023-11-03 ENCOUNTER — Ambulatory Visit: Payer: Medicare HMO | Admitting: Urology

## 2023-11-03 VITALS — BP 152/70 | HR 92

## 2023-11-03 DIAGNOSIS — D631 Anemia in chronic kidney disease: Secondary | ICD-10-CM

## 2023-11-03 DIAGNOSIS — M810 Age-related osteoporosis without current pathological fracture: Secondary | ICD-10-CM | POA: Diagnosis not present

## 2023-11-03 DIAGNOSIS — N184 Chronic kidney disease, stage 4 (severe): Secondary | ICD-10-CM | POA: Diagnosis not present

## 2023-11-03 DIAGNOSIS — N2889 Other specified disorders of kidney and ureter: Secondary | ICD-10-CM

## 2023-11-03 NOTE — Progress Notes (Signed)
Tonya Cummings,acting as a scribe for [name], MD.,have documented all relevant documentation on the behalf of [name], MD,as directed by  [name], MD while in the presence of [name], MD.  11/03/2023 10:45 AM   Tonya Cummings 1951/02/20 [id number]  Referring provider: Myrene Buddy, NP [address] [place],  [place] [address]  Chief Complaint  Patient presents with   Results    HPI:  72 year-old female with multiple medical comorbidities with a personal history of right renal mass.   She was first seen and evaluated in clinic on July 01, 2023, for a 5.7 by 4.3 central large right renal mass favoring renal cell carcinoma, although urothelial carcinoma could not be ruled out. She underwent diagnostic ureteroscopy which was negative, and no evidence of urothelial malignancy.    She has multiple medical comorbidities including a personal history of CKD with a baseline creatinine in the 2.5 to 3 range with a GFR of 18. In the interim she's followed by Dr. Cherylann Ratel who feels that undergoing a nephrectomy would most certainly result in the need for dialysis.    She had an MR of the abdomen on 10/11/2023 that showed the same interpolar right renal mass, which was essentially grossly unchanged from her previous MR in July.   Her most recent creatinine on 10/04/2023 was 2.08 with a GFR of 25.   She is also being followed by medical oncology primarily for anemia. She elected to forego EPO in light of this mass.   She  is accompanied husband.  She is s/p shoulder surgery x 2.  Still wearing a brace.  PT next week.  PMH: Past Medical History:  Diagnosis Date   Acute hypoxemic respiratory failure (HCC)    Anemia in stage 4 chronic kidney disease (HCC)    Anginal pain (HCC)    Anxiety    Aortic atherosclerosis (HCC)    CHF (congestive heart failure) (HCC)    a.) TTE 09/30/2021: EF 55-60%, no RWMAs, mild MR, G1DD   Chronic pain    Chronic  radicular pain of lower back    CKD (chronic kidney disease), stage IV (HCC)    Complication of anesthesia    a.) delayed emergence following ureteroscopy 07/2023   Coronary artery calcification seen on CT scan    Costochondritis    DDD (degenerative disc disease), lumbar    Depression    Dyspnea    GERD (gastroesophageal reflux disease)    Hiatal hernia    Hip dysplasia    Hyperlipidemia    Hypertension    Insomnia    Iron deficiency    Iron deficiency anemia    Low back pain    Low vitamin B12 level    Lumbar facet joint pain    Migraines    Nose colonized with MRSA 10/03/2020   a.) preop PCR (+) 10/03/2020 prior to RIGHT TKA; b.) preop PCR (+) 08/25/2023 prior to RIGHT REVERSE SHOULDER ARTHROPLASTY; BICEPS TENODESIS   Numbness and tingling of right leg    OA (osteoarthritis)    Obesity    OSA (obstructive sleep apnea)    a.) not currently utilizing nocturnal PAP therapy; positional. PCCM feels as if symptom can be mitigated with diet/exercise/weight loss unless develops significant symptoms.   Osteoporosis    a.) recieves denosumab injections   Pelvic fracture (HCC)    Pneumonia    Pulmonary fibrosis (HCC)    Raynaud's disease without gangrene    Restless  leg syndrome    a.) on pramipexole + oral Fe supplementation   Rheumatoid arthritis (HCC)    a.) Tx'd with hydroxychloroquine   Right renal mass 05/28/2023   a.) CT renal 05/28/2023:  5.7 cm mass in the medial aspect of right kidney   Seasonal allergies    Thoracic compression fracture [facility])     Surgical History: Past Surgical History:  Procedure Laterality Date   ABDOMINAL SURGERY     pt denies   APPENDECTOMY     CARPAL TUNNEL RELEASE Bilateral    CHOLECYSTECTOMY     COLONOSCOPY WITH PROPOFOL N/A 08/15/2020   Procedure: COLONOSCOPY WITH PROPOFOL;  Surgeon: Earline Mayotte, MD;  Location: [facility] END[facility]COPY;  Service: Endoscopy;  Laterality: N/A;   CYSTOSCOPY W/ RETROGRADES Right 07/18/2023   Procedure:  CYSTOSCOPY WITH RETROGRADE PYELOGRAM;  Surgeon: [name], MD;  Location: [facility] ORS;  Service: Urology;  Laterality: Right;   DILATION AND CURETTAGE OF UTERUS     ESOPHAGOGASTRODUODENOSCOPY (EGD) WITH PROPOFOL N/A 08/15/2020   Procedure: ESOPHAGOGASTRODUODENOSCOPY (EGD) WITH PROPOFOL;  Surgeon: Earline Mayotte, MD;  Location: [facility] END[facility];  Service: Endoscopy;  Laterality: N/A;   FRACTURE SURGERY     hip fracture    FRACTURE SURGERY     pelvic fracture plate    HIP SURGERY Left    KNEE ARTHROPLASTY Right 10/13/2020   Procedure: COMPUTER ASSISTED TOTAL KNEE ARTHROPLASTY - RNFA;  Surgeon: Donato Heinz, MD;  Location: [facility] OR[facility];  Service: Orthopedics;  Laterality: Right;   REVERSE SHOULDER ARTHROPLASTY Right 08/30/2023   Procedure: Right reverse shoulder arthroplasty, biceps tenodesis;  Surgeon: Signa Kell, MD;  Location: [facility] OR[facility];  Service: Orthopedics;  Laterality: Right;   TOTAL SHOULDER REVISION Right 10/03/2023   Procedure: Revision right reverse shoulder arthroplasty with conversion to long humeral stem and open reduction internal fixation of the humerus;  Surgeon: Signa Kell, MD;  Location: [facility] OR[facility];  Service: Orthopedics;  Laterality: Right;   TUBAL LIGATION     URETEROSCOPY  07/18/2023   Procedure: DIAGNOSTIC URETEROSCOPY;  Surgeon: [name], MD;  Location: [facility] OR[facility];  Service: Urology;;    Home Medications:  Allergies as of 11/03/2023       Reactions   Gabapentin Other (See Comments)   Weight gain   Ibuprofen Other (See Comments)   Headache        Medication List        Accurate as of November 03, 2023 10:45 AM. If you have any questions, ask your nurse or doctor.          acetaminophen 500 MG tablet Commonly known as: TYLENOL Take 2 tablets (1,000 mg total) by mouth every 6 (six) hours as needed.   aspirin EC 325 MG tablet Take 1 tablet (325 mg total) by mouth daily.   calcitRIOL 0.25 MCG capsule Commonly known as: ROCALTROL Take 0.25 mcg by  mouth every morning.   Calcium Carb-Cholecalciferol 600-400 MG-UNIT Caps Take 1 capsule by mouth 3 (three) times daily.   chlorhexidine 4 % external liquid Commonly known as: HIBICLENS Apply 15 mLs (1 Application total) topically as directed for 30 doses. Use as directed daily for 5 days every other week for 6 weeks.   Cholecalciferol 50 MCG (2000 UT) Caps Take 2,000 Units by mouth every morning.   cyanocobalamin 500 MCG tablet Commonly known as: VITAMIN B12 Take 500 mcg by mouth every morning.   cyclobenzaprine 10 MG tablet Commonly known as: FLEXERIL Take 10 mg by mouth at bedtime.  enalapril-hydrochlorothiazide 10-25 MG tablet Commonly known as: VASERETIC Take 1 tablet by mouth at bedtime.   ferrous sulfate 325 (65 FE) MG tablet Take 325 mg by mouth daily with breakfast.   furosemide 40 MG tablet Commonly known as: LASIX Take 1.5 tablets (60 mg total) by mouth every morning. Increase to 1.5 tablet (60 mg total) by mouth in morning and extra 1 tablet (40 mg total for maximum daily dose 100 mg) at lunch time as needed for up to 3 days for increased leg swelling, shortness of breath, weight gain 5+ lbs over 1-2 days. Seek medical care if these symptoms are not improving with increased dose.   hydroxychloroquine 200 MG tablet Commonly known as: PLAQUENIL Take 1 tablet by mouth 2 (two) times daily.   mirtazapine 30 MG tablet Commonly known as: REMERON Take 1 tablet by mouth at bedtime.   MULTIVITAMIN ADULT PO Take 1 tablet by mouth every morning.   mupirocin ointment 2 % Commonly known as: BACTROBAN Apply small about inside of both nostrils TWICE a day for the next 5 days.   omeprazole 40 MG capsule Commonly known as: PRILOSEC Take 40 mg by mouth every morning.   ondansetron 4 MG tablet Commonly known as: ZOFRAN Take 1 tablet (4 mg total) by mouth every 6 (six) hours as needed for nausea.   oxyCODONE 5 MG immediate release tablet Commonly known as: Oxy  IR/ROXICODONE Take 1 tablet (5 mg total) by mouth every 4 (four) hours as needed for moderate pain (pain score 4-6) (pain score 4-6).   Pirfenidone 267 MG Tabs Take 3 tablets by mouth 3 (three) times daily.   pramipexole 0.125 MG tablet Commonly known as: MIRAPEX Take 0.125 mg by mouth daily. Take 0.125 mg in the morning and 2 at bedtime   Prolia 60 MG/ML Sosy injection Generic drug: denosumab Inject 60 mg into the skin every 6 (six) months.   rosuvastatin 5 MG tablet Commonly known as: CRESTOR Take 5 mg by mouth at bedtime. Take every other night   spironolactone 25 MG tablet Commonly known as: ALDACTONE Take 25 mg by mouth every morning.   venlafaxine XR 150 MG 24 hr capsule Commonly known as: EFFEXOR-XR Take 150 mg by mouth daily with breakfast. Take with 75 mg for total 225 mg daily   venlafaxine XR 75 MG 24 hr capsule Commonly known as: EFFEXOR-XR Take 75 mg by mouth daily with breakfast.  TAKE 1 CAPSULE ONE TIME DAILY WITH 150 MG FOR TOTAL DAILY DOSE OF 225 MG        Allergies:  Allergies  Allergen Reactions   Gabapentin Other (See Comments)    Weight gain   Ibuprofen Other (See Comments)    Headache    Family History: Family History  Problem Relation Age of Onset   Diabetes Mother    Hypertension Mother    Aneurysm Father    Diabetes Son    Seizures Son    Osteosarcoma Brother    Cancer Brother    Diabetes Brother    Diabetes Maternal Grandfather    Heart disease Maternal Grandfather     Social History:  reports that she has never smoked. She has never used smokeless tobacco. She reports that she does not drink alcohol and does not use drugs.   Physical Exam: BP (!) 152/70   Pulse 92   Constitutional:  Alert and oriented, No acute distress. HEENT: [place] AT, moist mucus membranes.  Trachea midline, no masses. Neurologic: Grossly intact, no focal  deficits, moving all 4 extremities. Psychiatric: Normal mood and affect.   Pertinent  Imaging: EXAM: CT ABDOMEN AND PELVIS WITHOUT CONTRAST  TECHNIQUE: Multidetector CT imaging of the abdomen and pelvis was performed following the standard protocol without IV contrast.  RADIATION DOSE REDUCTION: This exam was performed according to the departmental dose-optimization program which includes automated exposure control, adjustment of the mA and/or kV according to patient size and/or use of iterative reconstruction technique.  COMPARISON:  None Available.  FINDINGS: Lower chest: There is focal alveolar infiltrate in the posteromedial left lower lung field. There are scattered foci of patchy ground-glass densities in both lower lung fields. There is mild ectasia of bronchi in the lower lung fields. There are a few noncalcified nodules in both lower lung fields measuring up to 8 mm in size. Similar finding was seen in previous CT chest done on 04/02/2022. Heart is enlarged in size. Coronary artery calcifications are seen.  Hepatobiliary: No focal abnormalities are seen in liver. There is no dilation of bile ducts. Gallbladder is not seen.  Pancreas: Atrophy.  No focal abnormalities are seen.  Spleen: Unremarkable.  Adrenals/Urinary Tract: Adrenals are unremarkable. There is no hydronephrosis. There is 5.7 x 4.2 cm mass lesion in the medial lower portion of right kidney. There are no renal or ureteral stones. There is 1.2 cm exophytic lesion in the lower pole of right kidney, possibly a cyst. Ureters are not dilated. Beam hardening artifacts limit evaluation of bladder.  Stomach/Bowel: Moderate to large fixed hiatal hernia is seen. Intra-abdominal portion of stomach is not distended. Small bowel loops are unremarkable. Appendix is not distinctly seen. There is no pericecal inflammation. Moderate to large amount of stool is seen in colon, more so in the right colon. There is no wall thickening in colon. There is no pericolic stranding.  Vascular/Lymphatic:  Scattered arterial calcifications are seen.  Reproductive: Unremarkable.  Other: There is no ascites or pneumoperitoneum. Umbilical hernia containing fat is seen.  Musculoskeletal: Metallic densities noted in the pubic bones from previous internal fixation. There are surgical screws transfixing the SI joints. There is 60 % decrease in height of body of L1 vertebra. There is 30-40% decrease in height of body of T11 vertebra. There is no demonstrable fracture line. This finding has not changed in comparison with CT lumbar spine done on 03/16/2018. Degenerative changes are noted in lumbar spine with spinal stenosis and encroachment of neural foramina at multiple levels.  IMPRESSION: There is 5.7 cm mass in the medial aspect of right kidney. Evaluation is limited without intravenous contrast. Possibility of malignant neoplasm in right kidney is not excluded. Follow-up contrast enhanced CT, PET-CT and tissue sampling as warranted should be considered. There is no hydronephrosis. Possible 1.2 cm cyst in the lower pole of right kidney.  There is focal alveolar infiltrate in the posteromedial left lower lung fields suggesting pneumonia in left lower lobe. There are scattered patchy faint ground-glass densities in the lower lung fields suggesting scarring or interstitial pneumonia.  There are multiple noncalcified nodules in both lower lung fields measuring up to 8 mm in size. Similar findings were seen in previous CT chest suggesting possible granulomas.  There is no evidence of intestinal obstruction or pneumoperitoneum. Moderate to large fixed hiatal hernia.  There is decrease in height of bodies of T11 and L1 vertebrae with no significant interval change suggesting old compression fractures. Degenerative changes are noted in lumbar spine.  Arteriosclerosis.  Coronary artery disease.  Other findings as described in the  body of the report.   Electronically Signed By: Ernie Avena M.D. On: 05/28/2023 13:52  This was personally reviewed and I agree with the radiologic interpretation.   EXAM: MRI ABDOMEN WITHOUT CONTRAST   TECHNIQUE: Multiplanar multisequence MR imaging was performed without the administration of intravenous contrast.   COMPARISON:  MRI abdomen dated 06/09/2023. CT abdomen/pelvis dated 05/28/2023.   FINDINGS: Lower chest: Lung bases are clear.   Hepatobiliary: Liver is within normal limits on unenhanced MR. Enhancing lesions on prior MRI are not evident in the absence of intravenous contrast administration.   Status post cholecystectomy. No intrahepatic ductal dilatation. Common duct measures 11 mm and smoothly tapers at the ampulla, likely postsurgical.   Pancreas:  Within normal limits.   Spleen:  Within normal limits.   Adrenals/Urinary Tract:  Adrenal glands are within normal limits.   5.9 x 4.3 cm mass in the interpolar right kidney (series 4/image 82), grossly unchanged. Differential considerations include renal cell carcinoma versus urothelial carcinoma.   Additional small bilateral renal cysts measuring up to 14 mm (series 4/image 25). No hydronephrosis.   Stomach/Bowel: Stomach is notable for a large hiatal hernia/inverted intrathoracic stomach.   Visualized bowel is grossly unremarkable.   Vascular/Lymphatic:  No evidence of abdominal aortic aneurysm.   No suspicious abdominal lymphadenopathy.   Other:  No abdominal ascites.   Musculoskeletal: Degenerative changes of the lumbar spine with mild dextroscoliosis.   IMPRESSION: 5.9 cm mass in the interpolar right kidney, grossly unchanged. Differential considerations include renal cell carcinoma versus urothelial carcinoma.   Enhancing lesions on prior MRI are not evident in the absence of intravenous contrast administration.   Additional stable ancillary findings as above.     Electronically Signed   By: Charline Bills M.D.   On: 10/25/2023  02:27  This was personally reviewed and I agree with the radiologic interpretation.    Assessment & Plan:    1. Right renal mass - In light of a lack of interval growth, continue to recommend consideration of a conservative approach  - Renal cell carcinoma is highly suspected (95% likelihood). Other possibilities include a benign renal mass or a different type of cancer related to bladder cancer. - Surgical intervention is not recommended at this time due to her current health status and potential need for dialysis post-nephrectomy at this time - Still recovering from shoulder surgery.   -Reasonable to continue next surveillance interval and consider more aggressive approach if increase in growth rate -Understands risk of progression in the interim  2. Anemia - Forego EPO therapy due to potential risk of promoting tumor growth - Iron infusions would be considered safe to continue  3. Osteoporosis - Continue with Prolia injections as also considered safe   Return in about 6 months (around 05/02/2024) for repeat MRI to monitor for any changes in the mass.  I have reviewed the above documentation for accuracy and completeness, and I agree with the above.   [name], MD    [facility] [address], [address] [place], [place] [address] [phone]  I spent 33 total minutes on the day of the encounter including pre-visit review of the medical record, face-to-face time with the patient, and post visit ordering of labs/imaging/tests.

## 2023-11-07 DIAGNOSIS — N184 Chronic kidney disease, stage 4 (severe): Secondary | ICD-10-CM | POA: Diagnosis not present

## 2023-11-07 DIAGNOSIS — M6281 Muscle weakness (generalized): Secondary | ICD-10-CM | POA: Diagnosis not present

## 2023-11-07 DIAGNOSIS — R809 Proteinuria, unspecified: Secondary | ICD-10-CM | POA: Diagnosis not present

## 2023-11-07 DIAGNOSIS — N2581 Secondary hyperparathyroidism of renal origin: Secondary | ICD-10-CM | POA: Diagnosis not present

## 2023-11-07 DIAGNOSIS — Z96611 Presence of right artificial shoulder joint: Secondary | ICD-10-CM | POA: Diagnosis not present

## 2023-11-07 DIAGNOSIS — D631 Anemia in chronic kidney disease: Secondary | ICD-10-CM | POA: Diagnosis not present

## 2023-11-07 DIAGNOSIS — M25511 Pain in right shoulder: Secondary | ICD-10-CM | POA: Diagnosis not present

## 2023-11-07 DIAGNOSIS — M25611 Stiffness of right shoulder, not elsewhere classified: Secondary | ICD-10-CM | POA: Diagnosis not present

## 2023-11-07 DIAGNOSIS — I10 Essential (primary) hypertension: Secondary | ICD-10-CM | POA: Diagnosis not present

## 2023-11-09 DIAGNOSIS — M25511 Pain in right shoulder: Secondary | ICD-10-CM | POA: Diagnosis not present

## 2023-11-09 DIAGNOSIS — M25611 Stiffness of right shoulder, not elsewhere classified: Secondary | ICD-10-CM | POA: Diagnosis not present

## 2023-11-09 DIAGNOSIS — Z96611 Presence of right artificial shoulder joint: Secondary | ICD-10-CM | POA: Diagnosis not present

## 2023-11-09 DIAGNOSIS — M6281 Muscle weakness (generalized): Secondary | ICD-10-CM | POA: Diagnosis not present

## 2023-11-14 ENCOUNTER — Ambulatory Visit
Admission: RE | Admit: 2023-11-14 | Discharge: 2023-11-14 | Disposition: A | Discharge status: Home / Self Care | Payer: Medicare HMO | Source: Ambulatory Visit | Attending: Orthopedic Surgery | Admitting: Orthopedic Surgery

## 2023-11-14 ENCOUNTER — Other Ambulatory Visit: Payer: Self-pay | Admitting: Orthopedic Surgery

## 2023-11-14 DIAGNOSIS — S42301D Unspecified fracture of shaft of humerus, right arm, subsequent encounter for fracture with routine healing: Secondary | ICD-10-CM | POA: Diagnosis not present

## 2023-11-14 DIAGNOSIS — Z96611 Presence of right artificial shoulder joint: Secondary | ICD-10-CM | POA: Insufficient documentation

## 2023-11-16 ENCOUNTER — Other Ambulatory Visit: Payer: Self-pay | Admitting: Orthopedic Surgery

## 2023-11-17 ENCOUNTER — Encounter
Admission: RE | Admit: 2023-11-17 | Discharge: 2023-11-17 | Disposition: A | Discharge status: Home / Self Care | Payer: Medicare HMO | Source: Ambulatory Visit | Attending: Orthopedic Surgery | Admitting: Orthopedic Surgery

## 2023-11-17 ENCOUNTER — Inpatient Hospital Stay: Payer: Medicare HMO | Attending: Oncology

## 2023-11-17 VITALS — BP 125/52 | HR 80 | Temp 97.7°F | Resp 18

## 2023-11-17 DIAGNOSIS — D631 Anemia in chronic kidney disease: Secondary | ICD-10-CM | POA: Insufficient documentation

## 2023-11-17 DIAGNOSIS — E611 Iron deficiency: Secondary | ICD-10-CM | POA: Diagnosis not present

## 2023-11-17 DIAGNOSIS — Z01818 Encounter for other preprocedural examination: Secondary | ICD-10-CM

## 2023-11-17 DIAGNOSIS — N184 Chronic kidney disease, stage 4 (severe): Secondary | ICD-10-CM | POA: Diagnosis not present

## 2023-11-17 DIAGNOSIS — D509 Iron deficiency anemia, unspecified: Secondary | ICD-10-CM

## 2023-11-17 DIAGNOSIS — N1832 Chronic kidney disease, stage 3b: Secondary | ICD-10-CM

## 2023-11-17 DIAGNOSIS — Z79899 Other long term (current) drug therapy: Secondary | ICD-10-CM | POA: Diagnosis not present

## 2023-11-17 DIAGNOSIS — Z22322 Carrier or suspected carrier of Methicillin resistant Staphylococcus aureus: Secondary | ICD-10-CM

## 2023-11-17 MED ORDER — CHLORHEXIDINE GLUCONATE 0.12 % MT SOLN
15.0000 mL | Freq: Once | OROMUCOSAL | Status: AC
  Administered 2023-11-18: 15 mL via OROMUCOSAL

## 2023-11-17 MED ORDER — ORAL CARE MOUTH RINSE: 15.0000 mL | Freq: Once | OROMUCOSAL | Status: AC

## 2023-11-17 MED ORDER — IRON SUCROSE 20 MG/ML IV SOLN
200.0000 mg | Freq: Once | INTRAVENOUS | Status: AC
Start: 2023-11-17 — End: 2023-11-17
  Administered 2023-11-17: 200 mg via INTRAVENOUS
  Filled 2023-11-17: qty 10

## 2023-11-17 MED ORDER — CEFAZOLIN SODIUM-DEXTROSE 2-4 GM/100ML-% IV SOLN
2.0000 g | INTRAVENOUS | Status: AC
  Administered 2023-11-18: 2 g via INTRAVENOUS

## 2023-11-17 MED ORDER — LACTATED RINGERS IV SOLN: INTRAVENOUS | Status: DC

## 2023-11-17 MED ORDER — TRANEXAMIC ACID-NACL 1000-0.7 MG/100ML-% IV SOLN
1000.0000 mg | INTRAVENOUS | Status: AC
  Administered 2023-11-18: 1000 mg via INTRAVENOUS

## 2023-11-17 MED ORDER — SODIUM CHLORIDE 0.9 % IV SOLN
200.0000 mg | Freq: Once | INTRAVENOUS | Status: DC
  Filled 2023-11-17: qty 10

## 2023-11-17 MED ORDER — SODIUM CHLORIDE 0.9% FLUSH
10.0000 mL | Freq: Once | INTRAVENOUS | Status: AC
Start: 2023-11-17 — End: 2023-11-17
  Administered 2023-11-17: 10 mL via INTRAVENOUS
  Filled 2023-11-17: qty 10

## 2023-11-17 NOTE — Pre-Procedure Instructions (Signed)
Unable to reach patient as she is in cancer center for infusion. Reviewed med history medications. Printed instructions and sent instructions and chg soap to patient at cancer center. She may call for any questions or concerns.

## 2023-11-17 NOTE — Patient Instructions (Addendum)
Your procedure is scheduled on: Friday 11/18/23 To find out your arrival time, please call [phone] between 1PM - 3PM on:   Thursday 11/17/23 Report to the Registration Desk on the 1st floor of the [facility]. Free Valet parking is available.  If your arrival time is 6:00 am, do not arrive before that time as the [facility] entrance doors do not open until 6:00 am.  REMEMBER: Instructions that are not followed completely may result in serious medical risk, up to and including death; or upon the discretion of your surgeon and anesthesiologist your surgery may need to be rescheduled.  Do not eat food after midnight the night before surgery.  No gum chewing or hard candies.  You may however, drink CLEAR liquids up to 2 hours before you are scheduled to arrive for your surgery. Do not drink anything within 2 hours of your scheduled arrival time.  Clear liquids include: - water  - apple juice without pulp - gatorade (not RED colors) - black coffee or tea (Do NOT add milk or creamers to the coffee or tea) Do NOT drink anything that is not on this list.  Type 1 and Type 2 diabetics should only drink water.  In addition, your doctor has ordered for you to drink the provided:  Ensure Pre-Surgery Clear Carbohydrate Drink  Gatorade G2 Drinking this carbohydrate drink up to two hours before surgery helps to reduce insulin resistance and improve patient outcomes. Please complete drinking 2 hours before scheduled arrival time.  One week prior to surgery: Stop Anti-inflammatories (NSAIDS) such as Advil, Aleve, Ibuprofen, Motrin, Naproxen, Naprosyn and Aspirin based products such as Excedrin, Goody's Powder, BC Powder. You may however, continue to take Tylenol if needed for pain up until the day of surgery.  Stop ANY OVER THE COUNTER supplements until after surgery.  Continue taking all prescribed medications with the exception of the following: only take those listed below on the day of  your surgery Friday 11/18/23.  TAKE ONLY THESE MEDICATIONS THE MORNING OF SURGERY WITH A SIP OF WATER:  venlafaxine XR (EFFEXOR-XR) 225 MG 24 hr capsule  rosuvastatin (CRESTOR) 5 MG tablet if scheduled to take Friday 11/18/23 pramipexole (MIRAPEX) 0.125 MG tablet  Pirfenidone 861 MG or 3 TABS  oxyCODONE (OXY IR/ROXICODONE) 5 MG immediate release tablet if needed omeprazole (PRILOSEC) 40 MG capsule Antacid (take one the night before and one on the morning of surgery - helps to prevent nausea after surgery.) pramipexole (MIRAPEX) 0.125 MG tablet   No Alcohol for 24 hours before or after surgery.  No Smoking including e-cigarettes for 24 hours before surgery.  No chewable tobacco products for at least 6 hours before surgery.  No nicotine patches on the day of surgery.  Do not use any "recreational" drugs for at least a week (preferably 2 weeks) before your surgery.  Please be advised that the combination of cocaine and anesthesia may have negative outcomes, up to and including death. If you test positive for cocaine, your surgery will be cancelled.  On the morning of surgery brush your teeth with toothpaste and water, you may rinse your mouth with mouthwash if you wish. Do not swallow any toothpaste or mouthwash.  Use CHG Soap or wipes as directed on instruction sheet.  Do not wear lotions, powders, or perfumes.   Do not shave body hair from the neck down 48 hours before surgery.  Wear comfortable clothing (specific to your surgery type) to the hospital.  Do not wear jewelry,  make-up, hairpins, clips or nail polish.  For welded (permanent) jewelry: bracelets, anklets, waist bands, etc.  Please have this removed prior to surgery.  If it is not removed, there is a chance that hospital personnel will need to cut it off on the day of surgery. Contact lenses, hearing aids and dentures may not be worn into surgery.  Do not bring valuables to the hospital. [facility] is not responsible for  any missing/lost belongings or valuables.   Notify your doctor if there is any change in your medical condition (cold, fever, infection).  If you are being discharged the day of surgery, you will not be allowed to drive home. You will need a responsible individual to drive you home and stay with you for 24 hours after surgery.   If you are taking public transportation, you will need to have a responsible individual with you.  If you are being admitted to the hospital overnight, leave your suitcase in the car. After surgery it may be brought to your room.  In case of increased patient census, it may be necessary for you, the patient, to continue your postoperative care in the Same Day Surgery department.  After surgery, you can help prevent lung complications by doing breathing exercises.  Take deep breaths and cough every 1-2 hours. Your doctor may order a device called an Incentive Spirometer to help you take deep breaths. When coughing or sneezing, hold a pillow firmly against your incision with both hands. This is called "splinting." Doing this helps protect your incision. It also decreases belly discomfort.  Surgery Visitation Policy:  Patients undergoing a surgery or procedure may have two family members or support persons with them as long as the person is not COVID-19 positive or experiencing its symptoms.   Inpatient Visitation:    Visiting hours are 7 a.m. to 8 p.m. Up to four visitors are allowed at one time in a patient room. The visitors may rotate out with other people during the day. One designated support person (adult) may remain overnight.  Please call the Pre-admissions Testing Dept. at [phone] if you have any questions about these instructions.     Preparing for Surgery with CHLORHEXIDINE GLUCONATE (CHG) Soap  Chlorhexidine Gluconate (CHG) Soap  o An antiseptic cleaner that kills germs and bonds with the skin to continue killing germs even after  washing  o Used for showering the night before surgery and morning of surgery  Before surgery, you can play an important role by reducing the number of germs on your skin.  CHG (Chlorhexidine gluconate) soap is an antiseptic cleanser which kills germs and bonds with the skin to continue killing germs even after washing.  Please do not use if you have an allergy to CHG or antibacterial soaps. If your skin becomes reddened/irritated stop using the CHG.  1. Shower the NIGHT BEFORE SURGERY and the MORNING OF SURGERY with CHG soap.  2. If you choose to wash your hair, wash your hair first as usual with your normal shampoo.  3. After shampooing, rinse your hair and body thoroughly to remove the shampoo.  4. Use CHG as you would any other liquid soap. You can apply CHG directly to the skin and wash gently with a scrungie or a clean washcloth.  5. Apply the CHG soap to your body only from the neck down. Do not use on open wounds or open sores. Avoid contact with your eyes, ears, mouth, and genitals (private parts). Wash face and  genitals (private parts) with your normal soap.  6. Wash thoroughly, paying special attention to the area where your surgery will be performed.  7. Thoroughly rinse your body with warm water.  8. Do not shower/wash with your normal soap after using and rinsing off the CHG soap.  9. Pat yourself dry with a clean towel.  10. Wear clean pajamas to bed the night before surgery.  12. Place clean sheets on your bed the night of your first shower and do not sleep with pets.  13. Shower again with the CHG soap on the day of surgery prior to arriving at the hospital.  14. Do not apply any deodorants/lotions/powders.  15. Please wear clean clothes to the hospital.

## 2023-11-18 ENCOUNTER — Inpatient Hospital Stay: Payer: Medicare HMO

## 2023-11-18 ENCOUNTER — Encounter: Admission: RE | Disposition: E | Payer: Self-pay | Source: Home / Self Care | Attending: Internal Medicine

## 2023-11-18 ENCOUNTER — Inpatient Hospital Stay
Admission: RE | Admit: 2023-11-18 | Discharge: 2023-12-14 | DRG: 483 | Disposition: E | Payer: Medicare HMO | Attending: Internal Medicine | Admitting: Internal Medicine

## 2023-11-18 ENCOUNTER — Inpatient Hospital Stay: Payer: Medicare HMO | Admitting: Certified Registered"

## 2023-11-18 ENCOUNTER — Encounter: Payer: Self-pay | Admitting: Orthopedic Surgery

## 2023-11-18 ENCOUNTER — Other Ambulatory Visit: Payer: Self-pay

## 2023-11-18 DIAGNOSIS — M059 Rheumatoid arthritis with rheumatoid factor, unspecified: Secondary | ICD-10-CM | POA: Diagnosis present

## 2023-11-18 DIAGNOSIS — E870 Hyperosmolality and hypernatremia: Secondary | ICD-10-CM | POA: Diagnosis present

## 2023-11-18 DIAGNOSIS — Z9049 Acquired absence of other specified parts of digestive tract: Secondary | ICD-10-CM

## 2023-11-18 DIAGNOSIS — D649 Anemia, unspecified: Secondary | ICD-10-CM

## 2023-11-18 DIAGNOSIS — M810 Age-related osteoporosis without current pathological fracture: Secondary | ICD-10-CM | POA: Diagnosis present

## 2023-11-18 DIAGNOSIS — K449 Diaphragmatic hernia without obstruction or gangrene: Secondary | ICD-10-CM | POA: Diagnosis not present

## 2023-11-18 DIAGNOSIS — R6521 Severe sepsis with septic shock: Secondary | ICD-10-CM | POA: Diagnosis not present

## 2023-11-18 DIAGNOSIS — Z01818 Encounter for other preprocedural examination: Secondary | ICD-10-CM

## 2023-11-18 DIAGNOSIS — J930 Spontaneous tension pneumothorax: Secondary | ICD-10-CM | POA: Diagnosis not present

## 2023-11-18 DIAGNOSIS — I5030 Unspecified diastolic (congestive) heart failure: Secondary | ICD-10-CM

## 2023-11-18 DIAGNOSIS — N1832 Chronic kidney disease, stage 3b: Secondary | ICD-10-CM

## 2023-11-18 DIAGNOSIS — I2510 Atherosclerotic heart disease of native coronary artery without angina pectoris: Secondary | ICD-10-CM | POA: Diagnosis present

## 2023-11-18 DIAGNOSIS — G8918 Other acute postprocedural pain: Secondary | ICD-10-CM | POA: Diagnosis not present

## 2023-11-18 DIAGNOSIS — I10 Essential (primary) hypertension: Secondary | ICD-10-CM | POA: Diagnosis present

## 2023-11-18 DIAGNOSIS — Z7982 Long term (current) use of aspirin: Secondary | ICD-10-CM

## 2023-11-18 DIAGNOSIS — M009 Pyogenic arthritis, unspecified: Secondary | ICD-10-CM | POA: Diagnosis not present

## 2023-11-18 DIAGNOSIS — M00011 Staphylococcal arthritis, right shoulder: Secondary | ICD-10-CM | POA: Diagnosis present

## 2023-11-18 DIAGNOSIS — M00211 Other streptococcal arthritis, right shoulder: Secondary | ICD-10-CM | POA: Diagnosis not present

## 2023-11-18 DIAGNOSIS — R54 Age-related physical debility: Secondary | ICD-10-CM | POA: Diagnosis present

## 2023-11-18 DIAGNOSIS — S42301A Unspecified fracture of shaft of humerus, right arm, initial encounter for closed fracture: Secondary | ICD-10-CM | POA: Diagnosis present

## 2023-11-18 DIAGNOSIS — N2889 Other specified disorders of kidney and ureter: Secondary | ICD-10-CM | POA: Diagnosis present

## 2023-11-18 DIAGNOSIS — N184 Chronic kidney disease, stage 4 (severe): Secondary | ICD-10-CM | POA: Diagnosis not present

## 2023-11-18 DIAGNOSIS — R570 Cardiogenic shock: Secondary | ICD-10-CM | POA: Diagnosis not present

## 2023-11-18 DIAGNOSIS — S42201A Unspecified fracture of upper end of right humerus, initial encounter for closed fracture: Secondary | ICD-10-CM | POA: Diagnosis not present

## 2023-11-18 DIAGNOSIS — D509 Iron deficiency anemia, unspecified: Secondary | ICD-10-CM | POA: Diagnosis present

## 2023-11-18 DIAGNOSIS — Z01810 Encounter for preprocedural cardiovascular examination: Secondary | ICD-10-CM | POA: Diagnosis not present

## 2023-11-18 DIAGNOSIS — F05 Delirium due to known physiological condition: Secondary | ICD-10-CM | POA: Diagnosis not present

## 2023-11-18 DIAGNOSIS — D631 Anemia in chronic kidney disease: Secondary | ICD-10-CM | POA: Diagnosis present

## 2023-11-18 DIAGNOSIS — Z7401 Bed confinement status: Secondary | ICD-10-CM

## 2023-11-18 DIAGNOSIS — K7200 Acute and subacute hepatic failure without coma: Secondary | ICD-10-CM | POA: Diagnosis present

## 2023-11-18 DIAGNOSIS — Z96651 Presence of right artificial knee joint: Secondary | ICD-10-CM | POA: Diagnosis present

## 2023-11-18 DIAGNOSIS — N171 Acute kidney failure with acute cortical necrosis: Secondary | ICD-10-CM | POA: Diagnosis not present

## 2023-11-18 DIAGNOSIS — T8450XA Infection and inflammatory reaction due to unspecified internal joint prosthesis, initial encounter: Secondary | ICD-10-CM | POA: Diagnosis not present

## 2023-11-18 DIAGNOSIS — R7401 Elevation of levels of liver transaminase levels: Secondary | ICD-10-CM | POA: Diagnosis not present

## 2023-11-18 DIAGNOSIS — J849 Interstitial pulmonary disease, unspecified: Secondary | ICD-10-CM | POA: Diagnosis not present

## 2023-11-18 DIAGNOSIS — R600 Localized edema: Secondary | ICD-10-CM | POA: Diagnosis not present

## 2023-11-18 DIAGNOSIS — I517 Cardiomegaly: Secondary | ICD-10-CM | POA: Diagnosis not present

## 2023-11-18 DIAGNOSIS — Z4682 Encounter for fitting and adjustment of non-vascular catheter: Secondary | ICD-10-CM | POA: Diagnosis not present

## 2023-11-18 DIAGNOSIS — J80 Acute respiratory distress syndrome: Secondary | ICD-10-CM | POA: Diagnosis not present

## 2023-11-18 DIAGNOSIS — J90 Pleural effusion, not elsewhere classified: Secondary | ICD-10-CM | POA: Diagnosis not present

## 2023-11-18 DIAGNOSIS — Z515 Encounter for palliative care: Secondary | ICD-10-CM

## 2023-11-18 DIAGNOSIS — J939 Pneumothorax, unspecified: Secondary | ICD-10-CM | POA: Diagnosis not present

## 2023-11-18 DIAGNOSIS — B9562 Methicillin resistant Staphylococcus aureus infection as the cause of diseases classified elsewhere: Secondary | ICD-10-CM | POA: Diagnosis present

## 2023-11-18 DIAGNOSIS — N2581 Secondary hyperparathyroidism of renal origin: Secondary | ICD-10-CM | POA: Diagnosis present

## 2023-11-18 DIAGNOSIS — E669 Obesity, unspecified: Secondary | ICD-10-CM | POA: Diagnosis present

## 2023-11-18 DIAGNOSIS — Y831 Surgical operation with implant of artificial internal device as the cause of abnormal reaction of the patient, or of later complication, without mention of misadventure at the time of the procedure: Secondary | ICD-10-CM | POA: Diagnosis present

## 2023-11-18 DIAGNOSIS — R1310 Dysphagia, unspecified: Secondary | ICD-10-CM | POA: Diagnosis not present

## 2023-11-18 DIAGNOSIS — B957 Other staphylococcus as the cause of diseases classified elsewhere: Secondary | ICD-10-CM | POA: Diagnosis present

## 2023-11-18 DIAGNOSIS — I130 Hypertensive heart and chronic kidney disease with heart failure and stage 1 through stage 4 chronic kidney disease, or unspecified chronic kidney disease: Secondary | ICD-10-CM | POA: Diagnosis present

## 2023-11-18 DIAGNOSIS — Z833 Family history of diabetes mellitus: Secondary | ICD-10-CM

## 2023-11-18 DIAGNOSIS — E785 Hyperlipidemia, unspecified: Secondary | ICD-10-CM | POA: Diagnosis present

## 2023-11-18 DIAGNOSIS — M0510 Rheumatoid lung disease with rheumatoid arthritis of unspecified site: Secondary | ICD-10-CM | POA: Diagnosis present

## 2023-11-18 DIAGNOSIS — I21A1 Myocardial infarction type 2: Secondary | ICD-10-CM | POA: Diagnosis not present

## 2023-11-18 DIAGNOSIS — D62 Acute posthemorrhagic anemia: Secondary | ICD-10-CM | POA: Diagnosis not present

## 2023-11-18 DIAGNOSIS — M9731XA Periprosthetic fracture around internal prosthetic right shoulder joint, initial encounter: Principal | ICD-10-CM | POA: Diagnosis present

## 2023-11-18 DIAGNOSIS — I4891 Unspecified atrial fibrillation: Secondary | ICD-10-CM | POA: Diagnosis not present

## 2023-11-18 DIAGNOSIS — Z66 Do not resuscitate: Secondary | ICD-10-CM | POA: Diagnosis not present

## 2023-11-18 DIAGNOSIS — Z22322 Carrier or suspected carrier of Methicillin resistant Staphylococcus aureus: Secondary | ICD-10-CM

## 2023-11-18 DIAGNOSIS — G2581 Restless legs syndrome: Secondary | ICD-10-CM | POA: Diagnosis present

## 2023-11-18 DIAGNOSIS — N170 Acute kidney failure with tubular necrosis: Secondary | ICD-10-CM | POA: Diagnosis not present

## 2023-11-18 DIAGNOSIS — Z8614 Personal history of Methicillin resistant Staphylococcus aureus infection: Secondary | ICD-10-CM

## 2023-11-18 DIAGNOSIS — T84038A Mechanical loosening of other internal prosthetic joint, initial encounter: Secondary | ICD-10-CM | POA: Diagnosis not present

## 2023-11-18 DIAGNOSIS — L89311 Pressure ulcer of right buttock, stage 1: Secondary | ICD-10-CM | POA: Diagnosis present

## 2023-11-18 DIAGNOSIS — A419 Sepsis, unspecified organism: Secondary | ICD-10-CM | POA: Diagnosis not present

## 2023-11-18 DIAGNOSIS — Z6833 Body mass index (BMI) 33.0-33.9, adult: Secondary | ICD-10-CM

## 2023-11-18 DIAGNOSIS — I5032 Chronic diastolic (congestive) heart failure: Secondary | ICD-10-CM | POA: Diagnosis present

## 2023-11-18 DIAGNOSIS — J811 Chronic pulmonary edema: Secondary | ICD-10-CM | POA: Diagnosis not present

## 2023-11-18 DIAGNOSIS — G928 Other toxic encephalopathy: Secondary | ICD-10-CM | POA: Diagnosis not present

## 2023-11-18 DIAGNOSIS — Z888 Allergy status to other drugs, medicaments and biological substances status: Secondary | ICD-10-CM

## 2023-11-18 DIAGNOSIS — T8459XA Infection and inflammatory reaction due to other internal joint prosthesis, initial encounter: Secondary | ICD-10-CM | POA: Diagnosis not present

## 2023-11-18 DIAGNOSIS — S20219A Contusion of unspecified front wall of thorax, initial encounter: Secondary | ICD-10-CM | POA: Diagnosis not present

## 2023-11-18 DIAGNOSIS — Y95 Nosocomial condition: Secondary | ICD-10-CM | POA: Diagnosis present

## 2023-11-18 DIAGNOSIS — Z808 Family history of malignant neoplasm of other organs or systems: Secondary | ICD-10-CM

## 2023-11-18 DIAGNOSIS — J189 Pneumonia, unspecified organism: Secondary | ICD-10-CM | POA: Diagnosis present

## 2023-11-18 DIAGNOSIS — Z471 Aftercare following joint replacement surgery: Secondary | ICD-10-CM | POA: Diagnosis not present

## 2023-11-18 DIAGNOSIS — Z9181 History of falling: Secondary | ICD-10-CM

## 2023-11-18 DIAGNOSIS — R918 Other nonspecific abnormal finding of lung field: Secondary | ICD-10-CM | POA: Diagnosis not present

## 2023-11-18 DIAGNOSIS — Z7189 Other specified counseling: Secondary | ICD-10-CM | POA: Diagnosis not present

## 2023-11-18 DIAGNOSIS — R0989 Other specified symptoms and signs involving the circulatory and respiratory systems: Secondary | ICD-10-CM | POA: Diagnosis not present

## 2023-11-18 DIAGNOSIS — M79605 Pain in left leg: Secondary | ICD-10-CM | POA: Diagnosis not present

## 2023-11-18 DIAGNOSIS — Z452 Encounter for adjustment and management of vascular access device: Secondary | ICD-10-CM | POA: Diagnosis not present

## 2023-11-18 DIAGNOSIS — M79604 Pain in right leg: Secondary | ICD-10-CM | POA: Diagnosis not present

## 2023-11-18 DIAGNOSIS — R0602 Shortness of breath: Secondary | ICD-10-CM | POA: Diagnosis not present

## 2023-11-18 DIAGNOSIS — J984 Other disorders of lung: Secondary | ICD-10-CM | POA: Diagnosis not present

## 2023-11-18 DIAGNOSIS — S2241XA Multiple fractures of ribs, right side, initial encounter for closed fracture: Secondary | ICD-10-CM | POA: Diagnosis not present

## 2023-11-18 DIAGNOSIS — Z96611 Presence of right artificial shoulder joint: Secondary | ICD-10-CM | POA: Diagnosis not present

## 2023-11-18 DIAGNOSIS — Z7901 Long term (current) use of anticoagulants: Secondary | ICD-10-CM

## 2023-11-18 DIAGNOSIS — Z8249 Family history of ischemic heart disease and other diseases of the circulatory system: Secondary | ICD-10-CM

## 2023-11-18 DIAGNOSIS — I469 Cardiac arrest, cause unspecified: Secondary | ICD-10-CM | POA: Insufficient documentation

## 2023-11-18 DIAGNOSIS — G4733 Obstructive sleep apnea (adult) (pediatric): Secondary | ICD-10-CM | POA: Diagnosis present

## 2023-11-18 DIAGNOSIS — E162 Hypoglycemia, unspecified: Secondary | ICD-10-CM | POA: Diagnosis not present

## 2023-11-18 DIAGNOSIS — I468 Cardiac arrest due to other underlying condition: Secondary | ICD-10-CM | POA: Diagnosis not present

## 2023-11-18 DIAGNOSIS — E872 Acidosis, unspecified: Secondary | ICD-10-CM | POA: Diagnosis not present

## 2023-11-18 DIAGNOSIS — J8410 Pulmonary fibrosis, unspecified: Secondary | ICD-10-CM | POA: Diagnosis present

## 2023-11-18 DIAGNOSIS — M9731XD Periprosthetic fracture around internal prosthetic right shoulder joint, subsequent encounter: Secondary | ICD-10-CM | POA: Diagnosis not present

## 2023-11-18 DIAGNOSIS — S0990XA Unspecified injury of head, initial encounter: Secondary | ICD-10-CM | POA: Diagnosis not present

## 2023-11-18 DIAGNOSIS — M436 Torticollis: Secondary | ICD-10-CM | POA: Diagnosis present

## 2023-11-18 DIAGNOSIS — I4892 Unspecified atrial flutter: Secondary | ICD-10-CM | POA: Diagnosis not present

## 2023-11-18 DIAGNOSIS — E538 Deficiency of other specified B group vitamins: Secondary | ICD-10-CM | POA: Diagnosis present

## 2023-11-18 DIAGNOSIS — M65811 Other synovitis and tenosynovitis, right shoulder: Secondary | ICD-10-CM | POA: Diagnosis not present

## 2023-11-18 DIAGNOSIS — Z79899 Other long term (current) drug therapy: Secondary | ICD-10-CM

## 2023-11-18 DIAGNOSIS — L8990 Pressure ulcer of unspecified site, unspecified stage: Secondary | ICD-10-CM | POA: Insufficient documentation

## 2023-11-18 DIAGNOSIS — J9819 Other pulmonary collapse: Secondary | ICD-10-CM | POA: Diagnosis not present

## 2023-11-18 DIAGNOSIS — J9601 Acute respiratory failure with hypoxia: Secondary | ICD-10-CM | POA: Diagnosis present

## 2023-11-18 DIAGNOSIS — N179 Acute kidney failure, unspecified: Secondary | ICD-10-CM | POA: Diagnosis not present

## 2023-11-18 DIAGNOSIS — I7300 Raynaud's syndrome without gangrene: Secondary | ICD-10-CM | POA: Diagnosis present

## 2023-11-18 DIAGNOSIS — R490 Dysphonia: Secondary | ICD-10-CM | POA: Diagnosis not present

## 2023-11-18 DIAGNOSIS — I483 Typical atrial flutter: Secondary | ICD-10-CM | POA: Diagnosis not present

## 2023-11-18 DIAGNOSIS — R579 Shock, unspecified: Secondary | ICD-10-CM | POA: Diagnosis not present

## 2023-11-18 HISTORY — PX: TOTAL SHOULDER REVISION: SHX6130

## 2023-11-18 LAB — CBC
HCT: 23 % — ABNORMAL LOW (ref 36.0–46.0)
HCT: 21.6 % — ABNORMAL LOW (ref 36.0–46.0)
HCT: 22.3 % — ABNORMAL LOW (ref 36.0–46.0)
Hemoglobin: 7 g/dL — ABNORMAL LOW (ref 12.0–15.0)
Hemoglobin: 6.7 g/dL — ABNORMAL LOW (ref 12.0–15.0)
Hemoglobin: 7 g/dL — ABNORMAL LOW (ref 12.0–15.0)
MCH: 28.2 pg (ref 26.0–34.0)
MCH: 28.6 pg (ref 26.0–34.0)
MCH: 28.9 pg (ref 26.0–34.0)
MCHC: 30.4 g/dL (ref 30.0–36.0)
MCHC: 31 g/dL (ref 30.0–36.0)
MCHC: 31.4 g/dL (ref 30.0–36.0)
MCV: 92.7 fL (ref 80.0–100.0)
MCV: 92.3 fL (ref 80.0–100.0)
MCV: 92.1 fL (ref 80.0–100.0)
Platelets: 386 10*3/uL (ref 150–400)
Platelets: 279 10*3/uL (ref 150–400)
Platelets: 294 10*3/uL (ref 150–400)
RBC: 2.48 MIL/uL — ABNORMAL LOW (ref 3.87–5.11)
RBC: 2.34 MIL/uL — ABNORMAL LOW (ref 3.87–5.11)
RBC: 2.42 MIL/uL — ABNORMAL LOW (ref 3.87–5.11)
RDW: 16.1 % — ABNORMAL HIGH (ref 11.5–15.5)
RDW: 15.8 % — ABNORMAL HIGH (ref 11.5–15.5)
RDW: 15.9 % — ABNORMAL HIGH (ref 11.5–15.5)
WBC: 10.8 10*3/uL — ABNORMAL HIGH (ref 4.0–10.5)
WBC: 8.4 10*3/uL (ref 4.0–10.5)
WBC: 8.2 10*3/uL (ref 4.0–10.5)
nRBC: 0 % (ref 0.0–0.2)
nRBC: 0 % (ref 0.0–0.2)
nRBC: 0 % (ref 0.0–0.2)

## 2023-11-18 LAB — C-REACTIVE PROTEIN: CRP: 4.7 mg/dL — ABNORMAL HIGH (ref <1.0)

## 2023-11-18 LAB — BASIC METABOLIC PANEL
Anion gap: 10 (ref 5–15)
BUN: 60 mg/dL — ABNORMAL HIGH (ref 8–23)
CO2: 22 mmol/L (ref 22–32)
Calcium: 8.2 mg/dL — ABNORMAL LOW (ref 8.9–10.3)
Chloride: 108 mmol/L (ref 98–111)
Creatinine, Ser: 2.5 mg/dL — ABNORMAL HIGH (ref 0.44–1.00)
GFR, Estimated: 20 mL/min — ABNORMAL LOW (ref >60)
Glucose, Bld: 120 mg/dL — ABNORMAL HIGH (ref 70–99)
Potassium: 4.5 mmol/L (ref 3.5–5.1)
Sodium: 140 mmol/L (ref 135–145)

## 2023-11-18 LAB — PREPARE RBC (CROSSMATCH)

## 2023-11-18 LAB — SURGICAL PCR SCREEN
MRSA, PCR: NEGATIVE
Staphylococcus aureus: POSITIVE — AB

## 2023-11-18 LAB — SEDIMENTATION RATE: Sed Rate: 126 mm/h — ABNORMAL HIGH (ref 0–30)

## 2023-11-18 SURGERY — REVISION, TOTAL ARTHROPLASTY, SHOULDER
Anesthesia: General | Site: Shoulder | Laterality: Right

## 2023-11-18 MED ORDER — BISACODYL 10 MG RE SUPP
10.0000 mg | Freq: Every day | RECTAL | Status: DC | PRN
  Administered 2023-11-23: 10 mg via RECTAL
  Filled 2023-11-18: qty 1

## 2023-11-18 MED ORDER — VITAMIN B-12 1000 MCG PO TABS
1000.0000 ug | ORAL_TABLET | Freq: Every morning | ORAL | Status: DC
  Administered 2023-11-19 – 2023-11-20 (×2): 1000 ug via ORAL
  Filled 2023-11-18 (×2): qty 1

## 2023-11-18 MED ORDER — LACTATED RINGERS IV SOLN: INTRAVENOUS | Status: AC

## 2023-11-18 MED ORDER — PRAMIPEXOLE DIHYDROCHLORIDE 0.25 MG PO TABS
0.2500 mg | ORAL_TABLET | Freq: Every day | ORAL | Status: DC
  Administered 2023-11-18: 0.25 mg via ORAL
  Filled 2023-11-18 (×2): qty 1

## 2023-11-18 MED ORDER — ALBUMIN HUMAN 5 % IV SOLN
INTRAVENOUS | Status: AC
  Filled 2023-11-18: qty 500

## 2023-11-18 MED ORDER — CEFAZOLIN SODIUM-DEXTROSE 2-4 GM/100ML-% IV SOLN
INTRAVENOUS | Status: AC
  Filled 2023-11-18: qty 100

## 2023-11-18 MED ORDER — ASPIRIN 325 MG PO TBEC
325.0000 mg | DELAYED_RELEASE_TABLET | Freq: Every day | ORAL | Status: DC
  Administered 2023-11-18 – 2023-11-19 (×2): 325 mg via ORAL
  Filled 2023-11-18 (×2): qty 1

## 2023-11-18 MED ORDER — METOCLOPRAMIDE HCL 5 MG PO TABS: 5.0000 mg | ORAL_TABLET | Freq: Three times a day (TID) | ORAL | Status: DC | PRN

## 2023-11-18 MED ORDER — ACETAMINOPHEN 10 MG/ML IV SOLN: 1000.0000 mg | Freq: Once | INTRAVENOUS | Status: DC | PRN

## 2023-11-18 MED ORDER — MUPIROCIN 2 % EX OINT: 1.0000 | TOPICAL_OINTMENT | Freq: Two times a day (BID) | CUTANEOUS | 0 refills | Status: DC

## 2023-11-18 MED ORDER — DAPTOMYCIN-SODIUM CHLORIDE 500-0.9 MG/50ML-% IV SOLN
6.0000 mg/kg | INTRAVENOUS | Status: DC
  Administered 2023-11-18 – 2023-11-20 (×2): 500 mg via INTRAVENOUS
  Filled 2023-11-18 (×2): qty 50

## 2023-11-18 MED ORDER — HYDROMORPHONE HCL 1 MG/ML IJ SOLN: 0.2000 mg | INTRAMUSCULAR | Status: DC | PRN

## 2023-11-18 MED ORDER — ROCURONIUM BROMIDE 10 MG/ML (PF) SYRINGE
PREFILLED_SYRINGE | INTRAVENOUS | Status: AC
  Filled 2023-11-18: qty 10

## 2023-11-18 MED ORDER — PHENYLEPHRINE HCL-NACL 20-0.9 MG/250ML-% IV SOLN
INTRAVENOUS | Status: AC
  Filled 2023-11-18: qty 250

## 2023-11-18 MED ORDER — CHLORHEXIDINE GLUCONATE 0.12 % MT SOLN
OROMUCOSAL | Status: AC
  Filled 2023-11-18: qty 15

## 2023-11-18 MED ORDER — CALCITRIOL 0.25 MCG PO CAPS
0.2500 ug | ORAL_CAPSULE | Freq: Every morning | ORAL | Status: DC
  Administered 2023-11-19: 0.25 ug via ORAL
  Filled 2023-11-18 (×2): qty 1

## 2023-11-18 MED ORDER — SODIUM CHLORIDE 0.9 % IV SOLN: INTRAVENOUS | Status: DC | PRN

## 2023-11-18 MED ORDER — ONDANSETRON HCL 4 MG/2ML IJ SOLN: 4.0000 mg | Freq: Once | INTRAMUSCULAR | Status: DC | PRN

## 2023-11-18 MED ORDER — ENALAPRIL-HYDROCHLOROTHIAZIDE 10-25 MG PO TABS: 1.0000 | ORAL_TABLET | Freq: Every day | ORAL | Status: DC

## 2023-11-18 MED ORDER — VITAMIN D 25 MCG (1000 UNIT) PO TABS
1000.0000 [IU] | ORAL_TABLET | Freq: Every morning | ORAL | Status: DC
  Administered 2023-11-19 – 2023-11-20 (×2): 1000 [IU] via ORAL
  Filled 2023-11-18 (×3): qty 1

## 2023-11-18 MED ORDER — VENLAFAXINE HCL ER 75 MG PO CP24
75.0000 mg | ORAL_CAPSULE | Freq: Every day | ORAL | Status: DC
  Administered 2023-11-19: 75 mg via ORAL
  Filled 2023-11-18: qty 1

## 2023-11-18 MED ORDER — METOCLOPRAMIDE HCL 5 MG/ML IJ SOLN: 5.0000 mg | Freq: Three times a day (TID) | INTRAMUSCULAR | Status: DC | PRN

## 2023-11-18 MED ORDER — PRAMIPEXOLE DIHYDROCHLORIDE 0.25 MG PO TABS: 0.1250 mg | ORAL_TABLET | ORAL | Status: DC

## 2023-11-18 MED ORDER — ACETAMINOPHEN 650 MG RE SUPP
650.0000 mg | Freq: Four times a day (QID) | RECTAL | Status: DC | PRN
Start: 2023-11-18 — End: 2023-11-21

## 2023-11-18 MED ORDER — PROPOFOL 10 MG/ML IV BOLUS
INTRAVENOUS | Status: DC | PRN
  Administered 2023-11-18: 100 mg via INTRAVENOUS

## 2023-11-18 MED ORDER — BUPIVACAINE LIPOSOME 1.3 % IJ SUSP
INTRAMUSCULAR | Status: AC
Start: 2023-11-18 — End: ?
  Filled 2023-11-18: qty 20

## 2023-11-18 MED ORDER — FENTANYL CITRATE (PF) 100 MCG/2ML IJ SOLN
INTRAMUSCULAR | Status: AC
  Filled 2023-11-18: qty 2

## 2023-11-18 MED ORDER — OYSTER SHELL CALCIUM/D3 500-5 MG-MCG PO TABS
1.0000 | ORAL_TABLET | Freq: Three times a day (TID) | ORAL | Status: DC
  Administered 2023-11-18 – 2023-11-19 (×3): 1 via ORAL
  Filled 2023-11-18 (×2): qty 1

## 2023-11-18 MED ORDER — ENOXAPARIN SODIUM 30 MG/0.3ML IJ SOSY
30.0000 mg | PREFILLED_SYRINGE | INTRAMUSCULAR | Status: DC
  Administered 2023-11-19 – 2023-11-20 (×2): 30 mg via SUBCUTANEOUS
  Filled 2023-11-18 (×2): qty 0.3

## 2023-11-18 MED ORDER — CEFTRIAXONE SODIUM 2 G IJ SOLR
2.0000 g | INTRAMUSCULAR | Status: DC
  Administered 2023-11-18 – 2023-11-21 (×4): 2 g via INTRAVENOUS
  Filled 2023-11-18 (×4): qty 20

## 2023-11-18 MED ORDER — TRANEXAMIC ACID-NACL 1000-0.7 MG/100ML-% IV SOLN
INTRAVENOUS | Status: AC
  Filled 2023-11-18: qty 100

## 2023-11-18 MED ORDER — SUGAMMADEX SODIUM 200 MG/2ML IV SOLN
INTRAVENOUS | Status: DC | PRN
  Administered 2023-11-18: 200 mg via INTRAVENOUS

## 2023-11-18 MED ORDER — BUPIVACAINE HCL (PF) 0.5 % IJ SOLN
INTRAMUSCULAR | Status: AC
  Filled 2023-11-18: qty 10

## 2023-11-18 MED ORDER — ROCURONIUM BROMIDE 100 MG/10ML IV SOLN
INTRAVENOUS | Status: DC | PRN
  Administered 2023-11-18: 60 mg via INTRAVENOUS

## 2023-11-18 MED ORDER — GENTAMICIN SULFATE 40 MG/ML IJ SOLN
INTRAMUSCULAR | Status: DC | PRN
Start: 2023-11-18 — End: 2023-11-18
  Administered 2023-11-18 (×2): 80 mg

## 2023-11-18 MED ORDER — ACETAMINOPHEN 500 MG PO TABS
1000.0000 mg | ORAL_TABLET | Freq: Three times a day (TID) | ORAL | Status: DC
  Administered 2023-11-18 – 2023-11-19 (×2): 1000 mg via ORAL
  Filled 2023-11-18 (×2): qty 2

## 2023-11-18 MED ORDER — ACETAMINOPHEN 325 MG PO TABS: 650.0000 mg | ORAL_TABLET | Freq: Four times a day (QID) | ORAL | Status: DC | PRN

## 2023-11-18 MED ORDER — TOBRAMYCIN SULFATE 1.2 G IJ SOLR
INTRAMUSCULAR | Status: DC | PRN
  Administered 2023-11-18: 1.2 g

## 2023-11-18 MED ORDER — EPHEDRINE 5 MG/ML INJ
INTRAVENOUS | Status: AC
  Filled 2023-11-18: qty 5

## 2023-11-18 MED ORDER — TRANEXAMIC ACID-NACL 1000-0.7 MG/100ML-% IV SOLN
1000.0000 mg | Freq: Once | INTRAVENOUS | Status: AC
  Administered 2023-11-18: 1000 mg via INTRAVENOUS

## 2023-11-18 MED ORDER — GENTAMICIN SULFATE 40 MG/ML IJ SOLN
INTRAMUSCULAR | Status: AC
  Filled 2023-11-18: qty 4

## 2023-11-18 MED ORDER — PRAMIPEXOLE DIHYDROCHLORIDE 0.25 MG PO TABS
0.1250 mg | ORAL_TABLET | Freq: Every morning | ORAL | Status: DC
  Administered 2023-11-19: 0.125 mg via ORAL
  Filled 2023-11-18: qty 0.5

## 2023-11-18 MED ORDER — ALUM & MAG HYDROXIDE-SIMETH 200-200-20 MG/5ML PO SUSP: 30.0000 mL | ORAL | Status: DC | PRN

## 2023-11-18 MED ORDER — POLYETHYLENE GLYCOL 3350 17 G PO PACK: 17.0000 g | PACK | Freq: Every day | ORAL | Status: DC | PRN

## 2023-11-18 MED ORDER — VANCOMYCIN HCL 1000 MG IV SOLR
INTRAVENOUS | Status: DC | PRN
  Administered 2023-11-18 (×2): 1000 mg

## 2023-11-18 MED ORDER — ONDANSETRON HCL 4 MG PO TABS: 4.0000 mg | ORAL_TABLET | Freq: Four times a day (QID) | ORAL | Status: DC | PRN

## 2023-11-18 MED ORDER — PHENOL 1.4 % MT LIQD: 1.0000 | OROMUCOSAL | Status: DC | PRN

## 2023-11-18 MED ORDER — FENTANYL CITRATE PF 50 MCG/ML IJ SOSY
50.0000 ug | PREFILLED_SYRINGE | Freq: Once | INTRAMUSCULAR | Status: AC
  Administered 2023-11-18: 50 ug via INTRAVENOUS

## 2023-11-18 MED ORDER — VASOPRESSIN 20 UNIT/ML IV SOLN
INTRAVENOUS | Status: DC | PRN
  Administered 2023-11-18 (×4): 1 [IU] via INTRAVENOUS
  Administered 2023-11-18: 2 [IU] via INTRAVENOUS
  Administered 2023-11-18 (×4): 1 [IU] via INTRAVENOUS

## 2023-11-18 MED ORDER — OXYCODONE HCL 5 MG PO TABS: 5.0000 mg | ORAL_TABLET | Freq: Once | ORAL | Status: DC | PRN

## 2023-11-18 MED ORDER — SODIUM CHLORIDE 0.9 % IR SOLN
Status: DC | PRN
  Administered 2023-11-18: 3000 mL

## 2023-11-18 MED ORDER — ONDANSETRON HCL 4 MG/2ML IJ SOLN
INTRAMUSCULAR | Status: DC | PRN
  Administered 2023-11-18: 4 mg via INTRAVENOUS

## 2023-11-18 MED ORDER — CEFAZOLIN SODIUM-DEXTROSE 2-4 GM/100ML-% IV SOLN: 2.0000 g | Freq: Four times a day (QID) | INTRAVENOUS | Status: DC

## 2023-11-18 MED ORDER — VANCOMYCIN HCL 1000 MG IV SOLR
INTRAVENOUS | Status: AC
  Filled 2023-11-18: qty 20

## 2023-11-18 MED ORDER — ONDANSETRON HCL 4 MG/2ML IJ SOLN
4.0000 mg | Freq: Four times a day (QID) | INTRAMUSCULAR | Status: DC | PRN
Start: 2023-11-18 — End: 2023-11-18

## 2023-11-18 MED ORDER — FERROUS SULFATE 325 (65 FE) MG PO TABS
325.0000 mg | ORAL_TABLET | Freq: Every day | ORAL | Status: DC
  Administered 2023-11-19 – 2023-11-20 (×2): 325 mg via ORAL
  Filled 2023-11-18 (×2): qty 1

## 2023-11-18 MED ORDER — PROPOFOL 10 MG/ML IV BOLUS
INTRAVENOUS | Status: AC
  Filled 2023-11-18: qty 20

## 2023-11-18 MED ORDER — FUROSEMIDE 20 MG PO TABS
60.0000 mg | ORAL_TABLET | Freq: Every morning | ORAL | Status: DC
  Administered 2023-11-19 – 2023-11-20 (×2): 60 mg via ORAL
  Filled 2023-11-18: qty 1
  Filled 2023-11-18: qty 3

## 2023-11-18 MED ORDER — PHENYLEPHRINE 80 MCG/ML (10ML) SYRINGE FOR IV PUSH (FOR BLOOD PRESSURE SUPPORT)
PREFILLED_SYRINGE | INTRAVENOUS | Status: DC | PRN
  Administered 2023-11-18 (×2): 80 ug via INTRAVENOUS

## 2023-11-18 MED ORDER — ALBUMIN HUMAN 5 % IV SOLN: INTRAVENOUS | Status: DC | PRN

## 2023-11-18 MED ORDER — MENTHOL 3 MG MT LOZG
1.0000 | LOZENGE | OROMUCOSAL | Status: DC | PRN
Start: 2023-11-18 — End: 2023-11-19

## 2023-11-18 MED ORDER — TOBRAMYCIN SULFATE 1.2 G IJ SOLR
INTRAMUSCULAR | Status: AC
  Filled 2023-11-18: qty 1.2

## 2023-11-18 MED ORDER — BUPIVACAINE LIPOSOME 1.3 % IJ SUSP
INTRAMUSCULAR | Status: DC | PRN
  Administered 2023-11-18: 20 mL

## 2023-11-18 MED ORDER — HYDROCHLOROTHIAZIDE 25 MG PO TABS: 25.0000 mg | ORAL_TABLET | Freq: Every day | ORAL | Status: DC

## 2023-11-18 MED ORDER — MIDAZOLAM HCL 2 MG/2ML IJ SOLN
INTRAMUSCULAR | Status: AC
  Filled 2023-11-18: qty 2

## 2023-11-18 MED ORDER — ENALAPRIL MALEATE 10 MG PO TABS
10.0000 mg | ORAL_TABLET | Freq: Every day | ORAL | Status: DC
  Filled 2023-11-18: qty 1

## 2023-11-18 MED ORDER — PHENYLEPHRINE HCL-NACL 20-0.9 MG/250ML-% IV SOLN
INTRAVENOUS | Status: DC | PRN
  Administered 2023-11-18: 80 ug/min via INTRAVENOUS

## 2023-11-18 MED ORDER — ROSUVASTATIN CALCIUM 10 MG PO TABS
5.0000 mg | ORAL_TABLET | ORAL | Status: DC
  Filled 2023-11-18: qty 1

## 2023-11-18 MED ORDER — HYDROMORPHONE HCL 1 MG/ML IJ SOLN: 0.5000 mg | INTRAMUSCULAR | Status: DC | PRN

## 2023-11-18 MED ORDER — EPHEDRINE SULFATE-NACL 50-0.9 MG/10ML-% IV SOSY
PREFILLED_SYRINGE | INTRAVENOUS | Status: DC | PRN
  Administered 2023-11-18 (×3): 5 mg via INTRAVENOUS

## 2023-11-18 MED ORDER — SODIUM CHLORIDE 0.9% IV SOLUTION
Freq: Once | INTRAVENOUS | Status: AC
Start: 2023-11-18 — End: 2023-11-19

## 2023-11-18 MED ORDER — OXYCODONE HCL 5 MG PO TABS: 5.0000 mg | ORAL_TABLET | Freq: Three times a day (TID) | ORAL | Status: DC | PRN

## 2023-11-18 MED ORDER — VASOPRESSIN 20 UNIT/ML IV SOLN
INTRAVENOUS | Status: AC
  Filled 2023-11-18: qty 1

## 2023-11-18 MED ORDER — DEXAMETHASONE SODIUM PHOSPHATE 10 MG/ML IJ SOLN
INTRAMUSCULAR | Status: AC
  Filled 2023-11-18: qty 1

## 2023-11-18 MED ORDER — ONDANSETRON HCL 4 MG/2ML IJ SOLN: 4.0000 mg | Freq: Four times a day (QID) | INTRAMUSCULAR | Status: DC | PRN

## 2023-11-18 MED ORDER — SENNOSIDES-DOCUSATE SODIUM 8.6-50 MG PO TABS: 1.0000 | ORAL_TABLET | Freq: Every evening | ORAL | Status: DC | PRN

## 2023-11-18 MED ORDER — DAPTOMYCIN-SODIUM CHLORIDE 500-0.9 MG/50ML-% IV SOLN
6.0000 mg/kg | INTRAVENOUS | Status: DC
  Filled 2023-11-18: qty 50

## 2023-11-18 MED ORDER — 0.9 % SODIUM CHLORIDE (POUR BTL) OPTIME
TOPICAL | Status: DC | PRN
  Administered 2023-11-18: 500 mL

## 2023-11-18 MED ORDER — OXYCODONE HCL 5 MG PO TABS: 10.0000 mg | ORAL_TABLET | ORAL | Status: DC | PRN

## 2023-11-18 MED ORDER — DOCUSATE SODIUM 100 MG PO CAPS
100.0000 mg | ORAL_CAPSULE | Freq: Two times a day (BID) | ORAL | Status: DC
  Administered 2023-11-18 – 2023-11-19 (×2): 100 mg via ORAL
  Filled 2023-11-18 (×2): qty 1

## 2023-11-18 MED ORDER — ONDANSETRON HCL 4 MG/2ML IJ SOLN
INTRAMUSCULAR | Status: AC
  Filled 2023-11-18: qty 2

## 2023-11-18 MED ORDER — MUPIROCIN 2 % EX OINT
TOPICAL_OINTMENT | Freq: Two times a day (BID) | CUTANEOUS | Status: DC
  Administered 2023-11-30: 1 via TOPICAL
  Filled 2023-11-18 (×2): qty 22

## 2023-11-18 MED ORDER — DEXAMETHASONE SODIUM PHOSPHATE 10 MG/ML IJ SOLN
INTRAMUSCULAR | Status: DC | PRN
  Administered 2023-11-18: 10 mg via INTRAVENOUS

## 2023-11-18 MED ORDER — SPIRONOLACTONE 25 MG PO TABS
25.0000 mg | ORAL_TABLET | Freq: Every day | ORAL | Status: DC
  Administered 2023-11-18: 25 mg via ORAL
  Filled 2023-11-18: qty 1

## 2023-11-18 MED ORDER — SODIUM CHLORIDE 0.9% FLUSH
3.0000 mL | Freq: Two times a day (BID) | INTRAVENOUS | Status: DC
  Administered 2023-11-18 – 2023-12-02 (×21): 3 mL via INTRAVENOUS

## 2023-11-18 MED ORDER — CHLORHEXIDINE GLUCONATE 4 % EX SOLN: 1.0000 | CUTANEOUS | 1 refills | Status: DC

## 2023-11-18 MED ORDER — CALCIUM CARB-CHOLECALCIFEROL 600-400 MG-UNIT PO CAPS: 1.0000 | ORAL_CAPSULE | Freq: Three times a day (TID) | ORAL | Status: DC

## 2023-11-18 MED ORDER — MIDAZOLAM HCL 2 MG/2ML IJ SOLN
1.0000 mg | Freq: Once | INTRAMUSCULAR | Status: AC
  Administered 2023-11-18: 1 mg via INTRAVENOUS

## 2023-11-18 MED ORDER — FENTANYL CITRATE (PF) 100 MCG/2ML IJ SOLN
INTRAMUSCULAR | Status: DC | PRN
  Administered 2023-11-18: 25 ug via INTRAVENOUS

## 2023-11-18 MED ORDER — SODIUM CHLORIDE 0.9% IV SOLUTION: Freq: Once | INTRAVENOUS | Status: AC

## 2023-11-18 MED ORDER — ADULT MULTIVITAMIN W/MINERALS CH
1.0000 | ORAL_TABLET | Freq: Every morning | ORAL | Status: DC
  Administered 2023-11-19 – 2023-11-20 (×2): 1 via ORAL
  Filled 2023-11-18 (×2): qty 1

## 2023-11-18 MED ORDER — VENLAFAXINE HCL ER 75 MG PO CP24
150.0000 mg | ORAL_CAPSULE | Freq: Every day | ORAL | Status: DC
  Administered 2023-11-19: 150 mg via ORAL
  Filled 2023-11-18: qty 1

## 2023-11-18 MED ORDER — FENTANYL CITRATE (PF) 100 MCG/2ML IJ SOLN
25.0000 ug | INTRAMUSCULAR | Status: DC | PRN
Start: 2023-11-18 — End: 2023-11-18

## 2023-11-18 MED ORDER — OXYCODONE HCL 5 MG/5ML PO SOLN: 5.0000 mg | Freq: Once | ORAL | Status: DC | PRN

## 2023-11-18 MED ORDER — OXYCODONE HCL 5 MG PO TABS: 5.0000 mg | ORAL_TABLET | ORAL | Status: DC | PRN

## 2023-11-18 MED ORDER — PANTOPRAZOLE SODIUM 40 MG PO TBEC
80.0000 mg | DELAYED_RELEASE_TABLET | Freq: Every day | ORAL | Status: DC
  Administered 2023-11-18 – 2023-11-20 (×3): 80 mg via ORAL
  Filled 2023-11-18 (×3): qty 2

## 2023-11-18 MED ORDER — CYCLOBENZAPRINE HCL 10 MG PO TABS: 10.0000 mg | ORAL_TABLET | Freq: Every evening | ORAL | Status: DC | PRN

## 2023-11-18 MED ORDER — LIDOCAINE HCL (PF) 2 % IJ SOLN
INTRAMUSCULAR | Status: AC
  Filled 2023-11-18: qty 5

## 2023-11-18 MED ORDER — FENTANYL CITRATE PF 50 MCG/ML IJ SOSY
PREFILLED_SYRINGE | INTRAMUSCULAR | Status: AC
  Filled 2023-11-18: qty 1

## 2023-11-18 MED ORDER — HYDROXYCHLOROQUINE SULFATE 200 MG PO TABS
200.0000 mg | ORAL_TABLET | Freq: Two times a day (BID) | ORAL | Status: DC
  Administered 2023-11-18 – 2023-11-19 (×2): 200 mg via ORAL
  Filled 2023-11-18 (×3): qty 1

## 2023-11-18 MED ORDER — DAPTOMYCIN-SODIUM CHLORIDE 500-0.9 MG/50ML-% IV SOLN: 6.0000 mg/kg | INTRAVENOUS | Status: DC

## 2023-11-18 MED ORDER — MIRTAZAPINE 15 MG PO TABS
30.0000 mg | ORAL_TABLET | Freq: Every day | ORAL | Status: DC
  Administered 2023-11-18: 30 mg via ORAL
  Filled 2023-11-18: qty 2

## 2023-11-18 MED ORDER — BUPIVACAINE HCL (PF) 0.5 % IJ SOLN
INTRAMUSCULAR | Status: DC | PRN
  Administered 2023-11-18: 10 mL

## 2023-11-18 SURGICAL SUPPLY — 78 items
BLADE SAGITTAL WIDE XTHICK NO (BLADE) ×1 IMPLANT
CEMENT BONE R 1X40 (Cement) IMPLANT
CHLORAPREP W/TINT 26 (MISCELLANEOUS) ×1 IMPLANT
CNTNR URN SCR LID CUP LEK RST (MISCELLANEOUS) IMPLANT
COOLER POLAR GLACIER W/PUMP (MISCELLANEOUS) ×1 IMPLANT
DERMABOND ADVANCED .7 DNX12 (GAUZE/BANDAGES/DRESSINGS) IMPLANT
DRAPE 3/4 80X56 (DRAPES) ×2 IMPLANT
DRAPE INCISE IOBAN 66X45 STRL (DRAPES) ×2 IMPLANT
DRAPE TABLE BACK 80X90 (DRAPES) ×1 IMPLANT
DRAPE U-SHAPE 47X51 STRL (DRAPES) ×1 IMPLANT
DRSG OPSITE POSTOP 3X4 (GAUZE/BANDAGES/DRESSINGS) IMPLANT
DRSG OPSITE POSTOP 4X10 (GAUZE/BANDAGES/DRESSINGS) IMPLANT
DRSG OPSITE POSTOP 4X6 (GAUZE/BANDAGES/DRESSINGS) IMPLANT
DRSG OPSITE POSTOP 4X8 (GAUZE/BANDAGES/DRESSINGS) IMPLANT
DRSG TEGADERM 2-3/8X2-3/4 SM (GAUZE/BANDAGES/DRESSINGS) IMPLANT
ELECT REM PT RETURN 9FT ADLT (ELECTROSURGICAL) ×1
ELECTRODE REM PT RTRN 9FT ADLT (ELECTROSURGICAL) ×1 IMPLANT
EVACUATOR 1/8 PVC DRAIN (DRAIN) IMPLANT
GAUZE SPONGE 2X2 STRL 8-PLY (GAUZE/BANDAGES/DRESSINGS) IMPLANT
GAUZE XEROFORM 1X8 LF (GAUZE/BANDAGES/DRESSINGS) IMPLANT
GLOVE BIOGEL PI IND STRL 8 (GLOVE) ×2 IMPLANT
GLOVE PI ULTRA LF STRL 7.5 (GLOVE) ×2 IMPLANT
GLOVE SURG ORTHO 8.0 STRL STRW (GLOVE) ×2 IMPLANT
GLOVE SURG SYN 8.0 (GLOVE) ×1 IMPLANT
GLOVE SURG SYN 8.0 PF PI (GLOVE) ×1 IMPLANT
GOWN STRL REUS W/ TWL LRG LVL3 (GOWN DISPOSABLE) ×2 IMPLANT
GOWN STRL REUS W/ TWL XL LVL3 (GOWN DISPOSABLE) ×1 IMPLANT
HEAD GLENOID W/SCREW 32MM (Shoulder) IMPLANT
HEAD NEUTRAL 42X16MM (Head) IMPLANT
HOOD PEEL AWAY T7 (MISCELLANEOUS) ×2 IMPLANT
JET LAVAGE IRRISEPT WOUND (IRRIGATION / IRRIGATOR) ×1
KIT STABILIZATION SHOULDER (MISCELLANEOUS) ×1 IMPLANT
KIT STIMULAN RAPID CURE 5CC (Orthopedic Implant) IMPLANT
LAVAGE JET IRRISEPT WOUND (IRRIGATION / IRRIGATOR) IMPLANT
MANIFOLD NEPTUNE II (INSTRUMENTS) ×1 IMPLANT
MASK FACE SPIDER DISP (MASK) ×1 IMPLANT
MAT ABSORB FLUID 56X50 GRAY (MISCELLANEOUS) ×1 IMPLANT
NDL REVERSE CUT 1/2 CRC (NEEDLE) IMPLANT
NDL SPNL 20GX3.5 QUINCKE YW (NEEDLE) IMPLANT
NEEDLE REVERSE CUT 1/2 CRC (NEEDLE) IMPLANT
NEEDLE SPNL 20GX3.5 QUINCKE YW (NEEDLE) IMPLANT
NS IRRIG 1000ML POUR BTL (IV SOLUTION) ×1 IMPLANT
PACK ARTHROSCOPY SHOULDER (MISCELLANEOUS) ×1 IMPLANT
PAD WRAPON POLAR SHDR XLG (MISCELLANEOUS) ×1 IMPLANT
PULSAVAC PLUS IRRIG FAN TIP (DISPOSABLE) ×1
SCREW BONE LOCKING RSP 5.0X14 (Screw) ×1 IMPLANT
SCREW BONE LOCKING RSP 5.0X30 (Screw) ×1 IMPLANT
SCREW BONE RSP LOCK 5X14 (Screw) IMPLANT
SCREW BONE RSP LOCK 5X26 (Screw) IMPLANT
SCREW BONE RSP LOCK 5X30 (Screw) IMPLANT
SCREW BONE RSP LOCKING 5.0X26 (Screw) ×1 IMPLANT
SLING ULTRA II LG (MISCELLANEOUS) IMPLANT
SLING ULTRA II M (MISCELLANEOUS) IMPLANT
SPONGE T-LAP 18X18 ~~LOC~~+RFID (SPONGE) ×1 IMPLANT
STAPLER SKIN PROX 35W (STAPLE) IMPLANT
STEM HEMI-ADAPTER W/SCRW SM (Stem) IMPLANT
STEM SHELL HUM 6X175 SM (Stem) IMPLANT
STRAP SAFETY 5IN WIDE (MISCELLANEOUS) ×1 IMPLANT
SUT ETHIBOND 5-0 MS/4 CCS GRN (SUTURE) ×1
SUT FIBERWIRE #2 38 BLUE 1/2 (SUTURE) ×1
SUT MNCRL AB 4-0 PS2 18 (SUTURE) IMPLANT
SUT PROLENE 6 0 P 1 18 (SUTURE) IMPLANT
SUT TICRON 2-0 30IN 311381 (SUTURE) ×2 IMPLANT
SUT VIC AB 0 CT1 36 (SUTURE) ×1 IMPLANT
SUT VIC AB 2-0 CT2 27 (SUTURE) ×2 IMPLANT
SUT XBRAID 1.4 BLK/WHT (SUTURE) IMPLANT
SUT XBRAID 1.4 BLUE (SUTURE) IMPLANT
SUT XBRAID 1.4 WHITE/BLUE (SUTURE) IMPLANT
SUT XBRAID 2 BLACK/BLUE (SUTURE) IMPLANT
SUTURE ETHBND 5-0 MS/4 CCS GRN (SUTURE) ×1 IMPLANT
SUTURE FIBERWR #2 38 BLUE 1/2 (SUTURE) ×1 IMPLANT
SYR 30ML LL (SYRINGE) IMPLANT
TIP BRUSH PULSAVAC PLUS 24.33 (MISCELLANEOUS) IMPLANT
TIP FAN IRRIG PULSAVAC PLUS (DISPOSABLE) ×1 IMPLANT
TIP IRRIG/SUCT HIGH CAPACITY (MISCELLANEOUS) IMPLANT
TRAP FLUID SMOKE EVACUATOR (MISCELLANEOUS) ×1 IMPLANT
WATER STERILE IRR 500ML POUR (IV SOLUTION) ×1 IMPLANT
WRAPON POLAR PAD SHDR XLG (MISCELLANEOUS) ×1

## 2023-11-18 NOTE — Assessment & Plan Note (Signed)
-   Resume home regimen tomorrow

## 2023-11-18 NOTE — Anesthesia Preprocedure Evaluation (Signed)
Anesthesia Evaluation  Patient identified by MRN, date of birth, ID band Patient awake    Reviewed: Allergy & Precautions, NPO status , Patient's Chart, lab work & pertinent test results  History of Anesthesia Complications (+) PROLONGED EMERGENCE and history of anesthetic complications (following ureteroscopy 07/2023)  Airway Mallampati: IV   Neck ROM: Full    Dental  (+) Missing, Loose, Dental Advidsory Given   Pulmonary shortness of breath (pulmonary fibrosis) and with exertion, sleep apnea  RA associcated ILD and pulmonary fibrosis  PULMONARY FUNCTION TESTING performed on 04/07/2023 1. 04/07/2023- FEV1 - 1.52L- 71%  2. 08/03/2022- FEV1- 1.16L - 51% 3. 03/18/2022- FEV1 - 1.19L - 55% 4. 10/26/2021- FEV1- 0.88L- 39%    Pulmonary exam normal        Cardiovascular Exercise Tolerance: Poor hypertension, + CAD (on CT scan), +CHF (Heart failure with preserved ejection fraction) and + DOE  + dysrhythmias  Rhythm:Regular Rate:Normal + Peripheral Edema ECG 05/28/23: NSR; LVH; ST and T abnormality  Echo 09/30/21:  1. Left ventricular ejection fraction, by estimation, is 55 to 60%. The  left ventricle has normal function. The left ventricle has no regional  wall motion abnormalities. Left ventricular diastolic parameters are  consistent with Grade I diastolic  dysfunction (impaired relaxation).  2. Right ventricular systolic function is normal. The right ventricular size is normal.  3. The mitral valve is normal in structure. Mild mitral valve regurgitation.  4. The aortic valve is normal in structure. Aortic valve regurgitation is not visualized.     Neuro/Psych  Headaches PSYCHIATRIC DISORDERS Anxiety Depression    chronic thoracic compression fractures, RLS, lumbar DDD with associated chronic radicular lower back pai  Pt states she has a cervical disc with disease and in need of a neurosurgical procedure. Her head is positioned  with rightward tilt. There is mechanical and pain resistance to range of motion of neck to the left and backward. Movement of neck does not cause worsening extremity pain or neurological symptoms. She endorses more left than right numbness and pain in arms.   Neuromuscular disease    GI/Hepatic hiatal hernia (large),GERD  ,,GERD with hiatal hernia which may be contributing with laryngopharyngeal reflux   Endo/Other  Obesity   Renal/GU Stable large right renal mass     Musculoskeletal   Abdominal   Peds  Hematology  (+) Blood dyscrasia, anemia   Anesthesia Other Findings Patient with new swelling and redness on her right shoulder. Patient states it started last night. Patient denies any fevers, chills, n/v. States that her shoulder hurts more than normal. Patient's hemoglobin is also 7. States that she normally has low Hgb but not that low. Will get a type and screen and give one unit of blood pre op. Dr. Audelia Acton examined the patient's swollen and red shoulder and stated it was ok to go. Also discussed risks of an interscalene block including shortness of breath, nerve damage, infection, bleeding, hoarse voice. Patient stated she has had a few in the past and was very happy with them. Patient stated she understood risks and agreed to proceed.    TTE 09/30/21  1. Left ventricular ejection fraction, by estimation, is 55 to 60%. The  left ventricle has normal function. The left ventricle has no regional  wall motion abnormalities. Left ventricular diastolic parameters are  consistent with Grade I diastolic  dysfunction (impaired relaxation).   2. Right ventricular systolic function is normal. The right ventricular  size is normal.   3. The mitral valve  is normal in structure. Mild mitral valve  regurgitation.   4. The aortic valve is normal in structure. Aortic valve regurgitation is  not visualized.     Cardiology Note from 09/12/23: 1. Chronic diastolic congestive heart failure  without symptoms today.  Had an episode of fluid overload postoperatively during recent hospitalization.  Echocardiogram revealed an LVEF of 55 to 60%, G1 DD, mild mitral valve regurgitation.  Patient appears to be euvolemic on exam today.  She is continued on furosemide and spironolactone.  She is also being sent for follow-up BMP today.  Encouraged to weigh herself daily and limit her sodium intake.  Unfortunately she is not a good candidate for SGLT2 inhibitors due to her kidney function.   2. Primary hypertension blood pressure today 142/70 which is likely driven from pain as patient states that she is not pain this morning.  She is continued on her current medication regimen.  She has been encouraged to continue to monitor blood pressure 1 to 2 hours postmedication administration.   3. CKD stage III-IV with last serum creatinine of 1.90 and a BUN of 42.  States she continues to follow with nephrology.  Encouraged to avoid use of NSAIDs.   4. Chronic back pain with severe neck pain associated symptoms of numbness and tingling to her bilateral upper extremities which she continues to follow with neurosurgery.   5. Status post right shoulder rotator cuff repair that continues to be followed by orthopedics.  Cardiology note 05/10/23:  1. Chronic diastolic congestive heart failure without symptoms today.  Last echocardiogram was completed in 2020 with an LVEF of 55-60%, no regional wall motion abnormalities, G1 DD, mild mitral valve regurgitation.  Patient appears euvolemic on exam today.  She is continued on furosemide 60 mg daily and spironolactone 25 mg daily.   2. Essential hypertension with blood pressure today 120/66.  She is continued on furosemide and spironolactone.  Encouraged to continue to monitor pressure 1 to 2 hours post medication administration.   3. CKD stage IV with a last serum creatinine 2.92 in 2/24, prescription sent to be followed by nephrology.   4. Chronic back pain with  severe neck pain with associated symptoms of numbness and tingling to the bilateral upper extremities.  She has upcoming surgery with neurosurgery.   5. Preoperative cardiovascular risk assessment completed.    Tonya Cummings perioperative risk of a major cardiac event is 6.6% according to the Revised Cardiac Risk Index (RCRI).  Therefore, she is at high risk for perioperative complications.   Her functional capacity is poor at 4.64 METs according to the Duke Activity Status Index (DASI). Recommendations: According to ACC/AHA guidelines, no further cardiovascular testing needed.  The patient may proceed to surgery at acceptable risk.     6.   Disposition patient return to clinic to see MD/APP in 1 year or sooner if needed with EKG on return.      Pulmonology note 04/07/23:  Dyspnea on exertion and shortness of breath at rest -Etiology is multifactorial-including chronic anemia, physical deconditioning, obstructive sleep apnea, heart and kidney failure, morbid obesity, inflammatory arthropathy with rheumatoid and Raynaud's phenomenon, metabolic syndrome, and likely GERD with hiatal hernia which may be contributing with laryngopharyngeal reflux. -Heart failure with preserved ejection fraction- most recent transthoracic echo October 2022 with EF 55 to 60% with normal left ventricular function and no regional wall abnormalities. Additionally grade 1 diastolic dysfunction was noted. -Chronic kidney disease stage 4-followed by [facility]- Dr. Lourdes Sledge -in the  context of rheumatoid arthritis and NSAID use with protein urea and MGUS. Continuing diuresis with Lasix chronically -Chronic anemia-followed by medical oncology-Dr. Rao-status post colonoscopy with a 5 mm polyp in the proximal ascending colon and diverticulosis of the sigmoid. EGD was normal B12 was normal.  Restless leg syndrome IRLS 16  - continue pramipexole - continue iron supplementation  Obstructive Sleep Apnea  Screening  Snorring: -Y Excessive datytime sleepiness: -Y Gasping or Choking during Sleep: 0Y Morning Headaches -YES  Trouble concentrating : -YES  Depression: PHQ2 - POSITIVE FOR DEPRSSION  Restlessness during Sleep: IRLS 17  Epworth Score: 13 Neck circumference >16in F or >17in M: 17 Structural abnormalities: Retrogranthia   Obesity: MBI 38 Caffeine intake : n Alcohol intake : n Medications : -remeron, mirapex, prozac  Symptoms of insomnia, hypersomnia, parasomnias, circadian rhythm disorder: n- patient is good candidate for CPAP and is agreeable to wearing device. She would benefit from treatment of OSA.   CPAP candidate: claustrophobia, interface issues: -none   Face to face discussion regarding Sleep Study done with patient and patient is agreeable: -patient is agreeable   Rheumatoid arthritis - patient with this condition may develop restrictive lung disease with Rheumatoid nodules and pulmonary fibrosis in pattern consistent with UIP.  -CT chest high resolution for ILD and review of pulmonary fibrotic disease prior to initiation of Esbriet April 2023 CT chest  Lungs/Pleura: Asymmetric scarring and volume loss of the right lung  base with elevation of the right hemidiaphragm, featuring irregular  peripheral interstitial opacity and ground-glass, arcing subpleural  elements, and evidence of subpleural sparing (series 5, image 115).  There are multiple small bilateral pulmonary nodules, the largest in  the right lower lobe measuring 0.7 cm (series 5, image 110). Mild,  lobular air trapping on expiratory phase imaging. No pleural  effusion or pneumothorax.   Progressive Pulmonary fibrosis - She does have rheumatoid arthritis and this is associated with UIP pattern of ILD as well as other subtypes -She wishes to start Antifibrotic - we have not been able to get approval by insurance  -RA associcated ILD and pulmonary fibrosis  Morbid obesity BMI >38 -Recommend dietary  changes and bariatric evaluation -We discussed changes in diet - recommend reduction in PO intake with fluid restriction -she lost 40lbs with lifestyle modification and some of the loss was fluid.    Reproductive/Obstetrics                             Anesthesia Physical Anesthesia Plan  ASA: 3  Anesthesia Plan: General ETT and General   Post-op Pain Management: Regional block*   Induction: Intravenous  PONV Risk Score and Plan: 3 and Ondansetron, Dexamethasone and Midazolam  Airway Management Planned: Oral ETT  Additional Equipment:   Intra-op Plan:   Post-operative Plan: Extubation in OR  Informed Consent: I have reviewed the patients History and Physical, chart, labs and discussed the procedure including the risks, benefits and alternatives for the proposed anesthesia with the patient or authorized representative who has indicated his/her understanding and acceptance.     Dental Advisory Given  Plan Discussed with: Anesthesiologist, CRNA and Surgeon  Anesthesia Plan Comments: (Patient consented for risks of anesthesia including but not limited to:  - adverse reactions to medications - damage to eyes, teeth, lips or other oral mucosa - nerve damage due to positioning  - sore throat or hoarseness - Damage to heart, brain, nerves, lungs, other parts of body or  loss of life  Patient voiced understanding and assent.)       Anesthesia Quick Evaluation

## 2023-11-18 NOTE — Transfer of Care (Signed)
Immediate Anesthesia Transfer of Care Note  Patient: Tonya Cummings  Procedure(s) Performed: Right revision reverse shoulder arthroplasty (conversion to longstem and glenosphere exchange) (Right: Shoulder)  Patient Location: PACU  Anesthesia Type:GA combined with regional for post-op pain  Level of Consciousness: drowsy  Airway & Oxygen Therapy: Patient Spontanous Breathing and Patient connected to face mask oxygen  Post-op Assessment: Report given to RN and Post -op Vital signs reviewed and stable  Post vital signs: Reviewed  Last Vitals:  Vitals Value Taken Time  BP 109/51 11/18/23 1708  Temp    Pulse 84 11/18/23 1711  Resp 20 11/18/23 1711  SpO2 100 % 11/18/23 1711  Vitals shown include unfiled device data.  Last Pain:         Complications: No notable events documented.

## 2023-11-18 NOTE — Op Note (Signed)
SURGERY DATE: 11/18/2023     PRE-OP DIAGNOSIS:  1.  Right reverse shoulder arthroplasty loosening after periprosthetic fracture   POST-OP DIAGNOSIS:  1.  Right periprosthetic infection 2.  Right periprosthetic humerus fractures   PROCEDURES:  1. Right reverse shoulder arthroplasty explant of glenoid and humeral components 2. Open treatment of right humerus fracture with intramedullary device 3. Right humerus insertion of antibiotic deliver device    SURGEON: Rosealee Albee, MD  ASSISTANT: Sonny Dandy, PA   ANESTHESIA: Gen + interscalene block with Exparel   ESTIMATED BLOOD LOSS: 200cc   TOTAL IV FLUIDS: per anesthesia record   IMPLANTS: Original Implants:D[employer] Surgical: RSP Glenoid Head w/Retaining screw 32-4; Monoblock Reverse Shoulder Baseplate with 6.75mm central screw; 3 locking screws into baseplate (superior, posterior, inferior); Small shell humeral stem 12 x 108 mm; +4 small socket insert.  New implants: all prior implants explanted with placement of a 6 x humeral stem with proximal antibiotic coating; 42x35mm head   INDICATION(S): Tonya Cummings is a 72 y.o. female who initially underwent right RSA by me on 08/30/2023.  She developed a periprosthetic fracture about the humeral implant with loose component that underwent revision RSA with conversion to a longstem humeral component with ORIF of the right proximal humerus on 10/03/2023.  There is no evidence of infection intraoperatively and culture samples taken at that time were negative.  Follow-up radiographs showed a loose humeral component with healing periprosthetic fracture as well as a separate periprosthetic fracture at the distal tip of the humeral component that was also healing.  Preoperative lab work was significant for elevated markers, but the patient had chronic elevation of her inflammatory markers.  After discussion of risks, benefits, and alternatives to surgery, the patient elected to proceed.  Prior to  surgery, I did discuss with the patient that if there was concern for infection, we would likely perform an explant with two-stage revision.  Patient was in agreement with this plan.  OPERATIVE FINDINGS: Small area of purulence about the superior aspect of the glenoid.  Loose humeral component.  Fracture of the greater tuberosity.  5/5 frozen sections were positive for significant acute inflammation (greater than 5 WBCs per high-power field).   OPERATIVE REPORT:   I identified Tonya Cummings in the pre-operative holding area. Informed consent was obtained and the surgical site was marked. I reviewed the risks and benefits of the proposed surgical intervention and the patient wished to proceed. An interscalene block with Exparel was administered by the Anesthesia team. The patient was transferred to the operative suite and general anesthesia was administered. The patient was placed in the beach chair position with the head of the bed elevated approximately 45 degrees. All down side pressure points were appropriately padded. Pre-op exam under anesthesia confirmed some stiffness and crepitus.  IV antibiotics were held for culture samples.  The extremity was then prepped and draped in standard fashion. A time out was performed confirming the correct extremity, correct patient, and correct procedure.    Prior deltopectoral incision was utilized. Cephalic vein was not visualized, but deltopectoral interval was found with blunt dissection. This was opened and retractors were placed. We gently palpated the axillary nerve and verified its position and continuity on both sides of the humerus with a Tug test. This test was repeated multiple times during the procedure for nerve localization and confirmed to be intact at the end of the case. The subscapularis was noted to be deficient.    At this  point, the joint was easily dislocated.  The humeral stem was loose and it could be pulled out by hand.  A total of 5  samples were sent for frozen section.  5 culture swabs of the same regions were sent for culture.  Purulence was noted when placing a retractor superior to the glenoid.  Intraoperatively, Pathology reported >5 WBC/HPF for all 5 specimens.  Decision was made at this time to perform explant of both the humeral and glenoid components.  Cerclage wires were cut and removed.  The glenosphere and glenoid baseplate were also removed such that all pre-existing metal hardware was removed.  A thorough debridement and synovectomy was performed.  The screw tracks from the glenoid were debrided using a curette and rongeur.  Pulsatile lavage and Irrisept irrigation solution was utilized.  The humeral canal was debrided using a curette and pulsatile lavage.  Irrisept irrigation solution was also utilized here.  Fluoroscopy was utilized to ensure that the 2 prior periprosthetic fractures of the humerus had not displaced.   A 6 x 170 mm humeral stem was trialed, and I was able to bypass the distal periprosthetic fracture by greater than 2 shaft widths without displacing it.  This allowed for appropriate intramedullary fixation of both humeral fractures.  Stimulan beads coated with vancomycin were placed into the humeral canal.  Antibiotic coated cement was placed around the proximal aspect of the humeral stem to cover all ingrowth surfaces.  Once this had almost hardened, the humeral stem was placed into the humeral canal and 20 degrees of retroversion.  Fluoroscopy was again utilized to ensure no displacement of prior periprosthetic fractures.  A collar of cement was also placed around the base of the humeral shell for rotational stability.  Next, after trialing, a 42 x 16 mm humeral head was placed to complete the hemiarthroplasty construct.  The wound was thoroughly irrigated.  Irrisept solution was used again.  Further Stimulan beads were placed.  A Hemovac drain was placed.  Subscapularis was deficient from the prior  surgery. We closed the deltopectoral interval with a running, 0-Vicryl suture.  Vancomycin powder was placed.  The skin was closed with 2-0 Vicryl and staples. Xeroform and Honeycomb dressing was applied. A PolarCare unit and sling were placed. Patient was extubated, transferred to a stretcher bed and to the post antesthesia care unit in stable condition.    POSTOPERATIVE PLAN: The patient will be admitted with plan for IV antibiotics and PICC line for ongoing.  ID consult.  Broad-spectrum antibiotics until cultures return. Operative arm to remain in sling at all times except RoM exercises and hygiene. Can perform pendulums, elbow/wrist/hand RoM exercises.  Avoid passive range of motion of the shoulder until follow up visit.  ASA for DVT prophylaxis x 6 weeks. Patient to return to clinic in ~2 weeks for post-operative appointment.

## 2023-11-18 NOTE — Anesthesia Procedure Notes (Signed)
Procedure Name: Intubation Date/Time: 11/18/2023 1:58 PM  Performed by: Morene Crocker, CRNAPre-anesthesia Checklist: Patient identified, Patient being monitored, Timeout performed, Emergency Drugs available and Suction available Patient Re-evaluated:Patient Re-evaluated prior to induction Oxygen Delivery Method: Circle system utilized Preoxygenation: Pre-oxygenation with 100% oxygen Induction Type: IV induction Ventilation: Mask ventilation without difficulty Laryngoscope Size: 3 and McGrath Grade View: Grade I Tube type: Oral Tube size: 7.0 mm Number of attempts: 1 Airway Equipment and Method: Stylet Placement Confirmation: ETT inserted through vocal cords under direct vision, positive ETCO2 and breath sounds checked- equal and bilateral Secured at: 21 cm Tube secured with: Tape Dental Injury: Teeth and Oropharynx as per pre-operative assessment  Comments: Smooth atraumatic intubation, no complications noted

## 2023-11-18 NOTE — H&P (Addendum)
History and Physical    Patient: Tonya Cummings JXB:[id number] DOB: 1951/02/12 DOA: 11/18/2023 DOS: the patient was seen and examined on 11/18/2023 PCP: Myrene Buddy, NP  Patient coming from: Home  Chief Complaint: No chief complaint on file.  HPI: Tonya Cummings is a 72 y.o. female with medical history significant of CKD Stage 4, HFpEF, HTN, B12 deficiency, anemia of CKD, rheumatoid arthritis, Raynaud's phenomenon, OSA not on CPAP, who presents to the hospital for a right shoulder arthroplasty.   Tonya Cummings states Tonya Cummings was in her usual state of health when Tonya Cummings presented to the hospital today for a scheduled procedure.  Tonya Cummings notes that her right shoulder pain has acutely worsened approximately 2 days prior.  Otherwise, Tonya Cummings denies any fever, chills, chest pain, shortness of breath, palpitations.  Tonya Cummings endorses unchanged lower extremity swelling.   Hospital course: On to the hospital arrival, patient was hypertensive with blood pressure 147/65 and heart rate of 107.  Tonya Cummings was saturating at 99% on room air.  Tonya Cummings was afebrile at 97.9.  Preoperative blood work demonstrated hemoglobin of 7.0 and WBC of 10.8.  MRSA PCR positive.  Intraoperatively, it was noted that patient's joint appeared infected and cultures were obtained.  Infectious disease consulted and recommended daptomycin and Rocephin.  TRH consulted for admission.  Review of Systems: As mentioned in the history of present illness. All other systems reviewed and are negative.  Past Medical History:  Diagnosis Date   Acute hypoxemic respiratory failure (HCC)    Anemia in stage 4 chronic kidney disease (HCC)    Anginal pain (HCC)    Anxiety    Aortic atherosclerosis (HCC)    CHF (congestive heart failure) (HCC)    a.) TTE 09/30/2021: EF 55-60%, no RWMAs, mild MR, G1DD   Chronic pain    Chronic radicular pain of lower back    CKD (chronic kidney disease), stage IV (HCC)    Complication of anesthesia    a.) delayed  emergence following ureteroscopy 07/2023   Coronary artery calcification seen on CT scan    Costochondritis    DDD (degenerative disc disease), lumbar    Depression    Dyspnea    GERD (gastroesophageal reflux disease)    Hiatal hernia    Hip dysplasia    Hyperlipidemia    Hypertension    Insomnia    Iron deficiency    Iron deficiency anemia    Low back pain    Low vitamin B12 level    Lumbar facet joint pain    Migraines    Nose colonized with MRSA 10/03/2020   a.) preop PCR (+) 10/03/2020 prior to RIGHT TKA; b.) preop PCR (+) 08/25/2023 prior to RIGHT REVERSE SHOULDER ARTHROPLASTY; BICEPS TENODESIS   Numbness and tingling of right leg    OA (osteoarthritis)    Obesity    OSA (obstructive sleep apnea)    a.) not currently utilizing nocturnal PAP therapy; positional. PCCM feels as if symptom can be mitigated with diet/exercise/weight loss unless develops significant symptoms.   Osteoporosis    a.) recieves denosumab injections   Pelvic fracture (HCC)    Pneumonia    Pulmonary fibrosis (HCC)    Raynaud's disease without gangrene    Restless leg syndrome    a.) on pramipexole + oral Fe supplementation   Rheumatoid arthritis (HCC)    a.) Tx'd with hydroxychloroquine   Right renal mass 05/28/2023   a.) CT renal 05/28/2023:  5.7 cm mass in the medial aspect of  right kidney   Seasonal allergies    Thoracic compression fracture [facility])    Past Surgical History:  Procedure Laterality Date   ABDOMINAL SURGERY     pt denies   APPENDECTOMY     CARPAL TUNNEL RELEASE Bilateral    CHOLECYSTECTOMY     COLONOSCOPY WITH PROPOFOL N/A 08/15/2020   Procedure: COLONOSCOPY WITH PROPOFOL;  Surgeon: Earline Mayotte, MD;  Location: [facility] ENDOSCOPY;  Service: Endoscopy;  Laterality: N/A;   CYSTOSCOPY W/ RETROGRADES Right 07/18/2023   Procedure: CYSTOSCOPY WITH RETROGRADE PYELOGRAM;  Surgeon: [name], MD;  Location: [facility] ORS;  Service: Urology;  Laterality: Right;   DILATION AND CURETTAGE  OF UTERUS     ESOPHAGOGASTRODUODENOSCOPY (EGD) WITH PROPOFOL N/A 08/15/2020   Procedure: ESOPHAGOGASTRODUODENOSCOPY (EGD) WITH PROPOFOL;  Surgeon: Earline Mayotte, MD;  Location: [facility] ENDOSCOPY;  Service: Endoscopy;  Laterality: N/A;   FRACTURE SURGERY     hip fracture    FRACTURE SURGERY     pelvic fracture plate    HIP SURGERY Left    KNEE ARTHROPLASTY Right 10/13/2020   Procedure: COMPUTER ASSISTED TOTAL KNEE ARTHROPLASTY - RNFA;  Surgeon: Donato Heinz, MD;  Location: [facility] ORS;  Service: Orthopedics;  Laterality: Right;   REVERSE SHOULDER ARTHROPLASTY Right 08/30/2023   Procedure: Right reverse shoulder arthroplasty, biceps tenodesis;  Surgeon: Signa Kell, MD;  Location: [facility] ORS;  Service: Orthopedics;  Laterality: Right;   TOTAL SHOULDER REVISION Right 10/03/2023   Procedure: Revision right reverse shoulder arthroplasty with conversion to long humeral stem and open reduction internal fixation of the humerus;  Surgeon: Signa Kell, MD;  Location: [facility] ORS;  Service: Orthopedics;  Laterality: Right;   TUBAL LIGATION     URETEROSCOPY  07/18/2023   Procedure: DIAGNOSTIC URETEROSCOPY;  Surgeon: [name], MD;  Location: [facility] ORS;  Service: Urology;;   Social History:  reports that Tonya Cummings has never smoked. Tonya Cummings has never used smokeless tobacco. Tonya Cummings reports that Tonya Cummings does not drink alcohol and does not use drugs.  Allergies  Allergen Reactions   Gabapentin Other (See Comments)    Weight gain   Ibuprofen Other (See Comments)    Headache    Family History  Problem Relation Age of Onset   Diabetes Mother    Hypertension Mother    Aneurysm Father    Diabetes Son    Seizures Son    Osteosarcoma Brother    Cancer Brother    Diabetes Brother    Diabetes Maternal Grandfather    Heart disease Maternal Grandfather     Prior to Admission medications   Medication Sig Start Date End Date Taking? Authorizing Provider  acetaminophen (TYLENOL) 500 MG tablet Take 2 tablets (1,000 mg  total) by mouth every 6 (six) hours as needed. 08/31/23  Yes Anson Oregon, PA-C  aspirin EC 325 MG tablet Take 1 tablet (325 mg total) by mouth daily. 08/31/23  Yes Anson Oregon, PA-C  calcitRIOL (ROCALTROL) 0.25 MCG capsule Take 0.25 mcg by mouth every morning. 05/22/21  Yes [provider]  Calcium Carb-Cholecalciferol 600-400 MG-UNIT CAPS Take 1 capsule by mouth 3 (three) times daily. 01/13/10  Yes [provider]  chlorhexidine (HIBICLENS) 4 % external liquid Apply 15 mLs (1 Application total) topically as directed for 30 doses. Use as directed daily for 5 days every other week for 6 weeks. 11/18/23  Yes Signa Kell, MD  Cholecalciferol 25 MCG (1000 UT) tablet Take 1,000 Units by mouth every morning. 09/23/09  Yes [provider]  cyanocobalamin 1000 MCG tablet Take 1,000 mcg by mouth every morning.   Yes [provider]  cyclobenzaprine (FLEXERIL) 10 MG tablet Take 10 mg by mouth at bedtime as needed for muscle spasms (Leg cramps).   Yes [provider]  enalapril-hydrochlorothiazide (VASERETIC) 10-25 MG tablet Take 1 tablet by mouth at bedtime. 09/12/23  Yes Hammock, Lavonna Rua, NP  ferrous sulfate 325 (65 FE) MG tablet Take 325 mg by mouth daily with breakfast.   Yes [provider]  furosemide (LASIX) 40 MG tablet Take 1.5 tablets (60 mg total) by mouth every morning. Increase to 1.5 tablet (60 mg total) by mouth in morning and extra 1 tablet (40 mg total for maximum daily dose 100 mg) at lunch time as needed for up to 3 days for increased leg swelling, shortness of breath, weight gain 5+ lbs over 1-2 days. Seek medical care if these symptoms are not improving with increased dose. 09/02/23  Yes Sunnie Nielsen, DO  hydroxychloroquine (PLAQUENIL) 200 MG tablet Take 200 mg by mouth 2 (two) times daily. 06/22/21  Yes [provider]  mirtazapine (REMERON) 30 MG tablet Take 30 mg by mouth at bedtime. 06/29/22  Yes [provider]  Multiple Vitamins-Minerals (MULTIVITAMIN ADULT PO) Take 1 tablet by mouth every morning. 09/10/08  Yes [provider]  mupirocin ointment (BACTROBAN) 2 % Apply small about inside of both nostrils TWICE a day for the next 5 days. 08/25/23  Yes Verlee Monte, NP  mupirocin ointment (BACTROBAN) 2 % Place 1 Application into the nose 2 (two) times daily for 60 doses. Use as directed 2 times daily for 5 days every other week for 6 weeks. 11/18/23 12/18/23 Yes Signa Kell, MD  omeprazole (PRILOSEC) 40 MG capsule Take 40 mg by mouth every morning.   Yes [provider]  ondansetron (ZOFRAN) 4 MG tablet Take 1 tablet (4 mg total) by mouth every 6 (six) hours as needed for nausea. 08/31/23  Yes Anson Oregon, PA-C  oxyCODONE (OXY IR/ROXICODONE) 5 MG immediate release tablet Take 1 tablet (5 mg total) by mouth every 4 (four) hours as needed for moderate pain (pain score 4-6) (pain score 4-6). 10/04/23  Yes Dedra Skeens, PA-C  Pirfenidone 267 MG TABS Take 801 tablets by mouth 3 (three) times daily. 12/24/22  Yes [provider]  pramipexole (MIRAPEX) 0.125 MG tablet Take 0.125-0.25 mg by mouth See admin instructions. Take 0.125 mg in the morning and 0.25 mg  at bedtime 02/15/18  Yes [provider]  PROLIA 60 MG/ML SOSY injection Inject 60 mg into the skin every 6 (six) months. 04/06/23  Yes [provider]  rosuvastatin (CRESTOR) 5 MG tablet Take 5 mg by mouth every other day. 07/18/19  Yes [provider]  spironolactone (ALDACTONE) 25 MG tablet Take 25 mg by mouth at bedtime. 04/09/21  Yes [provider]  tetrahydrozoline 0.05 % ophthalmic solution Place 1 drop into both eyes daily as needed (allergies).   Yes [provider]  venlafaxine XR (EFFEXOR-XR) 150 MG 24 hr capsule Take 150 mg by mouth See admin instructions. Take with 75 mg for total 225 mg in the morning 12/28/22 12/28/23 Yes [provider]  venlafaxine XR (EFFEXOR-XR) 75  MG 24 hr capsule Take 75 mg by mouth See admin instructions. Take with 150 mg for a total of 225 mg in the morning 08/24/23  Yes [provider]  chlorhexidine (HIBICLENS) 4 % external liquid Apply 15 mLs (1 Application total) topically  as directed for 30 doses. Use as directed daily for 5 days every other week for 6 weeks. Patient not taking: Reported on 11/17/2023 10/03/23   Signa Kell, MD    Physical Exam: Vitals:   11/18/23 1810 11/18/23 1815 11/18/23 1853 11/18/23 2049  BP:  (!) 117/54 121/66 100/65  Pulse: 88 93 90 86  Resp: (!) 23 (!) 31 (!) 22 20  Temp:  (!) 97.1 F (36.2 C) 97.8 F (36.6 C) 98 F (36.7 C)  TempSrc:   Oral   SpO2: 100% 99% 100% 99%  Weight:       Physical Exam Vitals and nursing note reviewed.  Constitutional:      General: Tonya Cummings is not in acute distress.    Appearance: Tonya Cummings is obese.  HENT:     Head: Normocephalic and atraumatic.     Mouth/Throat:     Mouth: Mucous membranes are moist.     Pharynx: Oropharynx is clear.  Cardiovascular:     Rate and Rhythm: Regular rhythm. Tachycardia present.     Heart sounds: No murmur heard. Pulmonary:     Effort: Pulmonary effort is normal. No respiratory distress.     Breath sounds: Normal breath sounds.  Abdominal:     General: Bowel sounds are normal.     Palpations: Abdomen is soft.  Musculoskeletal:     Comments: Trace bilateral pitting edema  Skin:    General: Skin is warm and dry.     Comments: Right shoulder in sling  Neurological:     General: No focal deficit present.     Mental Status: Tonya Cummings is alert and oriented to person, place, and time.  Psychiatric:        Mood and Affect: Mood normal.        Behavior: Behavior normal.    Data Reviewed: Preoperative CBC WBC of 10.8, hemoglobin of 7.0, and platelets of 386 ESR 126.  Previously 40 Postoperative CBC WBC of 8.4, hemoglobin of 6.7  There are no new results to review at this time.  Assessment and Plan:  * Periprosthetic fracture  around internal prosthetic right shoulder joint Complicated history of rotator cuff injury, now with periprosthetic fracture s/p repair, complicated by septic arthritis.  - Orthopedic surgery following; appreciate their recommendations - Pain control with Tylenol, oxycodone and Dilaudid  Septic arthritis [facility]) Patient endorses increased pain for the last 2 days, with intraoperative findings of purulent drainage consistent with septic arthritis.  Unclear source, as last operation was in October 2024.  No recent dental procedures, injections.  - Orthopedic surgery and infectious disease following; appreciate their recommendations - Daptomycin per pharmacy dosing - Rocephin 2 g daily - Blood cultures ordered - Surgical cultures pending - Will need PICC line placement once blood cultures result  Acute on chronic anemia History of CKD stage IV with associated anemia, baseline hemoglobin previously between 8 and 9, however consistently ~7 since surgery in October 2024.  Tonya Cummings has received 1 unit intraoperatively, however postop CBC with hemoglobin of 6.7.  - Additional 1 unit of packed RBCs ordered - Posttransfusion CBC - Continue to transfuse for hemoglobin less than 7 - If signs of hypervolemia, will need Lasix with future infusions  (HFpEF) heart failure with preserved ejection fraction (HCC) History of HFpEF with last EF of 55-60% in September 2024.  No evidence of hypervolemia at this time, however certainly at high risk.  - Telemetry monitoring while receiving IV fluids - Daily weights - Strict in and out - Continue  home regimen  CKD (chronic kidney disease) stage 4, GFR 15-29 ml/min (HCC) History of CKD stage IV with creatinine ranging between 1.8 and 2.7 over the last year.  Currently within baseline range  -Repeat BMP in the a.m.  Hypertension - Resume home regimen tomorrow  Seropositive rheumatoid arthritis (HCC) - Continue home Plaquenil  ILD (interstitial lung disease)  (HCC) History of ILD secondary to rheumatoid arthritis on Esbriet.  - Resume Esbriet on discharge  Advance Care Planning:   Code Status: Full Code verified by patient  Consults: Orthopedic surgery, ID  Family Communication: No family at bedside.  Severity of Illness: The appropriate patient status for this patient is INPATIENT. Inpatient status is judged to be reasonable and necessary in order to provide the required intensity of service to ensure the patient's safety. The patient's presenting symptoms, physical exam findings, and initial radiographic and laboratory data in the context of their chronic comorbidities is felt to place them at high risk for further clinical deterioration. Furthermore, it is not anticipated that the patient will be medically stable for discharge from the hospital within 2 midnights of admission.   * I certify that at the point of admission it is my clinical judgment that the patient will require inpatient hospital care spanning beyond 2 midnights from the point of admission due to high intensity of service, high risk for further deterioration and high frequency of surveillance required.*  Author: Verdene Lennert, MD 11/18/2023 9:12 PM  For on call review [email or web address].

## 2023-11-18 NOTE — Anesthesia Procedure Notes (Signed)
Anesthesia Regional Block: Interscalene brachial plexus block   Pre-Anesthetic Checklist: , timeout performed,  Correct Patient, Correct Site, Correct Laterality,  Correct Procedure, Correct Position, site marked,  Risks and benefits discussed,  Surgical consent,  Pre-op evaluation,  At surgeon's request and post-op pain management  Laterality: Right  Prep: chloraprep       Needles:  Injection technique: Single-shot  Needle Type: Echogenic Needle     Needle Length: 4cm  Needle Gauge: 25     Additional Needles:   Procedures:,,,, ultrasound used (permanent image in chart),,    Narrative:  Start time: 11/18/2023 1:45 PM End time: 11/18/2023 1:48 PM Injection made incrementally with aspirations every 5 mL.  Performed by: Personally  Anesthesiologist: Stephanie Coup, MD  Additional Notes: Patient's chart reviewed and they were deemed appropriate candidate for procedure, at surgeon's request. Patient educated about risks, benefits, and alternatives of the block including but not limited to: temporary or permanent nerve damage, bleeding, infection, damage to surround tissues, pneumothorax, hemidiaphragmatic paralysis, unilateral Horner's syndrome, block failure, local anesthetic toxicity. Patient expressed understanding. A formal time-out was conducted consistent with institution rules.  Monitors were applied, and minimal sedation used (see nursing record). The site was prepped with skin prep and allowed to dry, and sterile gloves were used. A high frequency linear ultrasound probe with probe cover was utilized throughout. C5-7 nerve roots located and appeared anatomically normal, local anesthetic injected around them, and echogenic block needle trajectory was monitored throughout. Aspiration performed every 5ml. Lung and blood vessels were avoided. All injections were performed without resistance and free of blood and paresthesias. The patient tolerated the procedure well.  Injectate:  20ml exparel + 10ml 0.5% bupivacaine

## 2023-11-18 NOTE — Assessment & Plan Note (Addendum)
History of CKD stage IV with associated anemia, baseline hemoglobin previously between 8 and 9, however consistently ~7 since surgery in October 2024.  She has received 1 unit intraoperatively, however postop CBC with hemoglobin of 6.7.  - Additional 1 unit of packed RBCs ordered - Posttransfusion CBC - Continue to transfuse for hemoglobin less than 7 - If signs of hypervolemia, will need Lasix with future infusions

## 2023-11-18 NOTE — Anesthesia Postprocedure Evaluation (Signed)
Anesthesia Post Note  Patient: Tonya Cummings  Procedure(s) Performed: Right revision reverse shoulder arthroplasty (conversion to longstem and glenosphere exchange) (Right: Shoulder)  Patient location during evaluation: PACU Anesthesia Type: General Level of consciousness: awake and alert, oriented and patient cooperative Pain management: pain level controlled Vital Signs Assessment: post-procedure vital signs reviewed and stable Respiratory status: spontaneous breathing, nonlabored ventilation and respiratory function stable Cardiovascular status: blood pressure returned to baseline and stable Postop Assessment: adequate PO intake Anesthetic complications: no   No notable events documented.   Last Vitals:  Vitals:   11/18/23 1745 11/18/23 1750  BP: (!) 110/57   Pulse: 90 95  Resp: (!) 21 (!) 26  Temp:    SpO2: 97% 98%    Last Pain:  Vitals:   11/18/23 1730  TempSrc:   PainSc: Asleep                 Reed Breech

## 2023-11-18 NOTE — Assessment & Plan Note (Signed)
Complicated history of rotator cuff injury, now with periprosthetic fracture s/p repair, complicated by septic arthritis.  - Orthopedic surgery following; appreciate their recommendations - Pain control with Tylenol, oxycodone and Dilaudid

## 2023-11-18 NOTE — Assessment & Plan Note (Signed)
History of HFpEF with last EF of 55-60% in September 2024.  No evidence of hypervolemia at this time, however certainly at high risk.  - Telemetry monitoring while receiving IV fluids - Daily weights - Strict in and out - Continue home regimen

## 2023-11-18 NOTE — H&P (Signed)
Paper H&P to be scanned into permanent record. H&P reviewed. There is increased erythema about the arm. Incision still appears to be healing well.   Patient has chronically elevated ESR of 40-70. ESR today is 126. CRP is pending.   Will plan to hold Abx and obtain frozen sections deep cultures with extended incubation.

## 2023-11-18 NOTE — Assessment & Plan Note (Signed)
History of CKD stage IV with creatinine ranging between 1.8 and 2.7 over the last year.  Currently within baseline range  -Repeat BMP in the a.m.

## 2023-11-18 NOTE — Progress Notes (Signed)
Pharmacy Antibiotic Note  Tonya Cummings is a 72 y.o. female admitted on 11/18/2023 with  septic arthritis .  Pharmacy has been consulted for Daptomycin dosing.  -Hx CKD4, anemia, Rheumatoid arthritis-on hydroxychloroquine (Prolia every 6 months) -MRSA PCR +, WBC 8.4, afebrile -Crcl 20.6 ml/min -s/p 11/18/23  Periprosthetic infection and now explant of glenoid and humeral components, Right humerus insertion of antibiotic deliver device   Plan: Will order Daptomycin ~6 mg/kg (500 mg) IV q48h for Crcl 20 ml/min Will check CK in am,  pt on Rosuvastatin PTA  F/u renal fxn, cultures, length of therapy   Weight: 77.1 kg (170 lb)  Temp (24hrs), Avg:97.6 F (36.4 C), Min:97.1 F (36.2 C), Max:97.9 F (36.6 C)  Recent Labs  Lab 11/18/23 1140 11/18/23 1748  WBC 10.8* 8.4  CREATININE  --  2.50*    Estimated Creatinine Clearance: 20.6 mL/min (A) (by C-G formula based on SCr of 2.5 mg/dL (H)).    Allergies  Allergen Reactions   Gabapentin Other (See Comments)    Weight gain   Ibuprofen Other (See Comments)    Headache    Antimicrobials this admission: Vanc 2 gm mixed w/ cement, gentamicin 80 mg mixed w/ cement, cefazolin 2gm x 1 Dapto 12/6 >>    Dose adjustments this admission:    Microbiology results: 12/6 BCx: pending   UCx:      Sputum:    12/6 MRSA PCR: positive 12/6 wound cx pending  Thank you for allowing pharmacy to be a part of this patient's care.  Tonya Cummings A 11/18/2023 7:23 PM

## 2023-11-18 NOTE — Assessment & Plan Note (Addendum)
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  - Resume Esbriet on discharge

## 2023-11-18 NOTE — Assessment & Plan Note (Addendum)
-   Continue home Plaquenil 

## 2023-11-18 NOTE — Assessment & Plan Note (Addendum)
Patient endorses increased pain for the last 2 days, with intraoperative findings of purulent drainage consistent with septic arthritis.  Unclear source, as last operation was in October 2024.  No recent dental procedures, injections.  - Orthopedic surgery and infectious disease following; appreciate their recommendations - Daptomycin per pharmacy dosing - Rocephin 2 g daily - Blood cultures ordered - Surgical cultures pending - Will need PICC line placement once blood cultures result

## 2023-11-19 ENCOUNTER — Inpatient Hospital Stay: Payer: Medicare HMO

## 2023-11-19 DIAGNOSIS — I469 Cardiac arrest, cause unspecified: Secondary | ICD-10-CM | POA: Diagnosis not present

## 2023-11-19 DIAGNOSIS — Z22322 Carrier or suspected carrier of Methicillin resistant Staphylococcus aureus: Secondary | ICD-10-CM

## 2023-11-19 DIAGNOSIS — J930 Spontaneous tension pneumothorax: Secondary | ICD-10-CM | POA: Diagnosis not present

## 2023-11-19 DIAGNOSIS — M00211 Other streptococcal arthritis, right shoulder: Secondary | ICD-10-CM | POA: Diagnosis not present

## 2023-11-19 DIAGNOSIS — M9731XA Periprosthetic fracture around internal prosthetic right shoulder joint, initial encounter: Secondary | ICD-10-CM

## 2023-11-19 DIAGNOSIS — D631 Anemia in chronic kidney disease: Secondary | ICD-10-CM

## 2023-11-19 DIAGNOSIS — N184 Chronic kidney disease, stage 4 (severe): Secondary | ICD-10-CM | POA: Diagnosis not present

## 2023-11-19 LAB — CBC
HCT: 25.8 % — ABNORMAL LOW (ref 36.0–46.0)
Hemoglobin: 8 g/dL — ABNORMAL LOW (ref 12.0–15.0)
MCH: 28.6 pg (ref 26.0–34.0)
MCHC: 31 g/dL (ref 30.0–36.0)
MCV: 92.1 fL (ref 80.0–100.0)
Platelets: 327 10*3/uL (ref 150–400)
RBC: 2.8 MIL/uL — ABNORMAL LOW (ref 3.87–5.11)
RDW: 16 % — ABNORMAL HIGH (ref 11.5–15.5)
WBC: 27.5 10*3/uL — ABNORMAL HIGH (ref 4.0–10.5)
nRBC: 0 % (ref 0.0–0.2)

## 2023-11-19 LAB — COMPREHENSIVE METABOLIC PANEL
ALT: 55 U/L — ABNORMAL HIGH (ref 0–44)
ALT: 54 U/L — ABNORMAL HIGH (ref 0–44)
AST: 96 U/L — ABNORMAL HIGH (ref 15–41)
AST: 122 U/L — ABNORMAL HIGH (ref 15–41)
Albumin: 2.8 g/dL — ABNORMAL LOW (ref 3.5–5.0)
Albumin: 2.6 g/dL — ABNORMAL LOW (ref 3.5–5.0)
Alkaline Phosphatase: 135 U/L — ABNORMAL HIGH (ref 38–126)
Alkaline Phosphatase: 124 U/L (ref 38–126)
Anion gap: 16 — ABNORMAL HIGH (ref 5–15)
Anion gap: 10 (ref 5–15)
BUN: 60 mg/dL — ABNORMAL HIGH (ref 8–23)
BUN: 71 mg/dL — ABNORMAL HIGH (ref 8–23)
CO2: 19 mmol/L — ABNORMAL LOW (ref 22–32)
CO2: 24 mmol/L (ref 22–32)
Calcium: 10 mg/dL (ref 8.9–10.3)
Calcium: 8.9 mg/dL (ref 8.9–10.3)
Chloride: 105 mmol/L (ref 98–111)
Chloride: 106 mmol/L (ref 98–111)
Creatinine, Ser: 2.95 mg/dL — ABNORMAL HIGH (ref 0.44–1.00)
Creatinine, Ser: 2.83 mg/dL — ABNORMAL HIGH (ref 0.44–1.00)
GFR, Estimated: 17 mL/min — ABNORMAL LOW (ref >60)
GFR, Estimated: 17 mL/min — ABNORMAL LOW (ref >60)
Glucose, Bld: 162 mg/dL — ABNORMAL HIGH (ref 70–99)
Glucose, Bld: 121 mg/dL — ABNORMAL HIGH (ref 70–99)
Potassium: 5.2 mmol/L — ABNORMAL HIGH (ref 3.5–5.1)
Potassium: 5.2 mmol/L — ABNORMAL HIGH (ref 3.5–5.1)
Sodium: 140 mmol/L (ref 135–145)
Sodium: 140 mmol/L (ref 135–145)
Total Bilirubin: 0.9 mg/dL (ref <1.2)
Total Bilirubin: 0.6 mg/dL (ref <1.2)
Total Protein: 6.9 g/dL (ref 6.5–8.1)
Total Protein: 6.7 g/dL (ref 6.5–8.1)

## 2023-11-19 LAB — PREPARE RBC (CROSSMATCH)

## 2023-11-19 LAB — CBC WITH DIFFERENTIAL/PLATELET
Abs Immature Granulocytes: 0.05 10*3/uL (ref 0.00–0.07)
Abs Immature Granulocytes: 0.68 10*3/uL — ABNORMAL HIGH (ref 0.00–0.07)
Basophils Absolute: 0 10*3/uL (ref 0.0–0.1)
Basophils Absolute: 0.1 10*3/uL (ref 0.0–0.1)
Basophils Relative: 0 %
Basophils Relative: 0 %
Eosinophils Absolute: 0 10*3/uL (ref 0.0–0.5)
Eosinophils Absolute: 0 10*3/uL (ref 0.0–0.5)
Eosinophils Relative: 0 %
Eosinophils Relative: 0 %
HCT: 23.4 % — ABNORMAL LOW (ref 36.0–46.0)
HCT: 29.1 % — ABNORMAL LOW (ref 36.0–46.0)
Hemoglobin: 7.1 g/dL — ABNORMAL LOW (ref 12.0–15.0)
Hemoglobin: 8.7 g/dL — ABNORMAL LOW (ref 12.0–15.0)
Immature Granulocytes: 1 %
Immature Granulocytes: 5 %
Lymphocytes Relative: 9 %
Lymphocytes Relative: 28 %
Lymphs Abs: 0.8 10*3/uL (ref 0.7–4.0)
Lymphs Abs: 4.1 10*3/uL — ABNORMAL HIGH (ref 0.7–4.0)
MCH: 28.2 pg (ref 26.0–34.0)
MCH: 28.9 pg (ref 26.0–34.0)
MCHC: 30.3 g/dL (ref 30.0–36.0)
MCHC: 29.9 g/dL — ABNORMAL LOW (ref 30.0–36.0)
MCV: 92.9 fL (ref 80.0–100.0)
MCV: 96.7 fL (ref 80.0–100.0)
Monocytes Absolute: 0.7 10*3/uL (ref 0.1–1.0)
Monocytes Absolute: 0.6 10*3/uL (ref 0.1–1.0)
Monocytes Relative: 8 %
Monocytes Relative: 4 %
Neutro Abs: 7.1 10*3/uL (ref 1.7–7.7)
Neutro Abs: 9.3 10*3/uL — ABNORMAL HIGH (ref 1.7–7.7)
Neutrophils Relative %: 82 %
Neutrophils Relative %: 63 %
Platelets: 304 10*3/uL (ref 150–400)
Platelets: 334 10*3/uL (ref 150–400)
RBC: 2.52 MIL/uL — ABNORMAL LOW (ref 3.87–5.11)
RBC: 3.01 MIL/uL — ABNORMAL LOW (ref 3.87–5.11)
RDW: 16 % — ABNORMAL HIGH (ref 11.5–15.5)
RDW: 16.1 % — ABNORMAL HIGH (ref 11.5–15.5)
WBC: 8.6 10*3/uL (ref 4.0–10.5)
WBC: 14.8 10*3/uL — ABNORMAL HIGH (ref 4.0–10.5)
nRBC: 0 % (ref 0.0–0.2)
nRBC: 0 % (ref 0.0–0.2)

## 2023-11-19 LAB — BLOOD GAS, ARTERIAL
Acid-Base Excess: 0.8 mmol/L (ref 0.0–2.0)
Acid-base deficit: 11.9 mmol/L — ABNORMAL HIGH (ref 0.0–2.0)
Bicarbonate: 18.3 mmol/L — ABNORMAL LOW (ref 20.0–28.0)
Bicarbonate: 25.3 mmol/L (ref 20.0–28.0)
FIO2: 75 %
MECHVT: 500 mL
Mechanical Rate: 18
O2 Saturation: 91.2 %
O2 Saturation: 99.6 %
PEEP: 5 cmH2O
Patient temperature: 37
Patient temperature: 37
pCO2 arterial: 59 mm[Hg] — ABNORMAL HIGH (ref 32–48)
pCO2 arterial: 39 mm[Hg] (ref 32–48)
pH, Arterial: 7.1 — CL (ref 7.35–7.45)
pH, Arterial: 7.42 (ref 7.35–7.45)
pO2, Arterial: 70 mm[Hg] — ABNORMAL LOW (ref 83–108)
pO2, Arterial: 141 mm[Hg] — ABNORMAL HIGH (ref 83–108)

## 2023-11-19 LAB — BASIC METABOLIC PANEL
Anion gap: 9 (ref 5–15)
BUN: 60 mg/dL — ABNORMAL HIGH (ref 8–23)
CO2: 25 mmol/L (ref 22–32)
Calcium: 8.9 mg/dL (ref 8.9–10.3)
Chloride: 107 mmol/L (ref 98–111)
Creatinine, Ser: 2.65 mg/dL — ABNORMAL HIGH (ref 0.44–1.00)
GFR, Estimated: 19 mL/min — ABNORMAL LOW (ref >60)
Glucose, Bld: 116 mg/dL — ABNORMAL HIGH (ref 70–99)
Potassium: 5.6 mmol/L — ABNORMAL HIGH (ref 3.5–5.1)
Sodium: 141 mmol/L (ref 135–145)

## 2023-11-19 LAB — LACTIC ACID, PLASMA
Lactic Acid, Venous: 7.3 mmol/L (ref 0.5–1.9)
Lactic Acid, Venous: 2.2 mmol/L (ref 0.5–1.9)
Lactic Acid, Venous: 1.5 mmol/L (ref 0.5–1.9)

## 2023-11-19 LAB — TROPONIN I (HIGH SENSITIVITY)
Troponin I (High Sensitivity): 208 ng/L (ref <18)
Troponin I (High Sensitivity): 392 ng/L (ref <18)
Troponin I (High Sensitivity): 749 ng/L (ref <18)

## 2023-11-19 LAB — PROTIME-INR
INR: 1.4 — ABNORMAL HIGH (ref 0.8–1.2)
Prothrombin Time: 17.2 s — ABNORMAL HIGH (ref 11.4–15.2)

## 2023-11-19 LAB — MAGNESIUM: Magnesium: 2.2 mg/dL (ref 1.7–2.4)

## 2023-11-19 LAB — CK
Total CK: 108 U/L (ref 38–234)
Total CK: 134 U/L (ref 38–234)
Total CK: 291 U/L — ABNORMAL HIGH (ref 38–234)

## 2023-11-19 LAB — HIV ANTIBODY (ROUTINE TESTING W REFLEX): HIV Screen 4th Generation wRfx: NONREACTIVE

## 2023-11-19 LAB — PHOSPHORUS: Phosphorus: 5.8 mg/dL — ABNORMAL HIGH (ref 2.5–4.6)

## 2023-11-19 LAB — MRSA NEXT GEN BY PCR, NASAL: MRSA by PCR Next Gen: NOT DETECTED

## 2023-11-19 LAB — GLUCOSE, CAPILLARY: Glucose-Capillary: 136 mg/dL — ABNORMAL HIGH (ref 70–99)

## 2023-11-19 MED ORDER — FAMOTIDINE 20 MG PO TABS: 20.0000 mg | ORAL_TABLET | Freq: Two times a day (BID) | ORAL | Status: DC

## 2023-11-19 MED ORDER — SODIUM CHLORIDE 0.9% IV SOLUTION: Freq: Once | INTRAVENOUS | Status: AC

## 2023-11-19 MED ORDER — NOREPINEPHRINE 4 MG/250ML-% IV SOLN
2.0000 ug/min | INTRAVENOUS | Status: DC
  Administered 2023-11-19: 2 ug/min via INTRAVENOUS
  Administered 2023-11-20: 5 ug/min via INTRAVENOUS
  Administered 2023-11-21: 3 ug/min via INTRAVENOUS
  Administered 2023-11-22: 4 ug/min via INTRAVENOUS
  Administered 2023-11-22: 3 ug/min via INTRAVENOUS
  Administered 2023-11-23: 4 ug/min via INTRAVENOUS
  Filled 2023-11-19 (×5): qty 250

## 2023-11-19 MED ORDER — PRAMIPEXOLE DIHYDROCHLORIDE 0.25 MG PO TABS
0.1250 mg | ORAL_TABLET | Freq: Every morning | ORAL | Status: DC
  Administered 2023-11-20 – 2023-11-24 (×5): 0.125 mg
  Filled 2023-11-19 (×5): qty 0.5

## 2023-11-19 MED ORDER — MIDAZOLAM HCL 2 MG/2ML IJ SOLN
2.0000 mg | INTRAMUSCULAR | Status: DC | PRN
  Administered 2023-11-19 – 2023-11-20 (×10): 2 mg via INTRAVENOUS
  Filled 2023-11-19 (×10): qty 2

## 2023-11-19 MED ORDER — SODIUM CHLORIDE 0.9% FLUSH
3.0000 mL | Freq: Two times a day (BID) | INTRAVENOUS | Status: DC
  Administered 2023-11-20 – 2023-12-02 (×21): 3 mL via INTRAVENOUS

## 2023-11-19 MED ORDER — PRAMIPEXOLE DIHYDROCHLORIDE 0.25 MG PO TABS
0.2500 mg | ORAL_TABLET | Freq: Every day | ORAL | Status: DC
  Administered 2023-11-19 – 2023-11-23 (×5): 0.25 mg
  Filled 2023-11-19 (×6): qty 1

## 2023-11-19 MED ORDER — VECURONIUM BROMIDE 10 MG IV SOLR
INTRAVENOUS | Status: AC
  Filled 2023-11-19: qty 10

## 2023-11-19 MED ORDER — NOREPINEPHRINE 4 MG/250ML-% IV SOLN
INTRAVENOUS | Status: AC
  Filled 2023-11-19: qty 250

## 2023-11-19 MED ORDER — FENTANYL BOLUS VIA INFUSION
25.0000 ug | INTRAVENOUS | Status: DC | PRN
  Administered 2023-11-19 (×4): 50 ug via INTRAVENOUS
  Administered 2023-11-19: 100 ug via INTRAVENOUS
  Administered 2023-11-20: 75 ug via INTRAVENOUS
  Administered 2023-11-20 – 2023-11-21 (×3): 100 ug via INTRAVENOUS
  Administered 2023-11-21: 50 ug via INTRAVENOUS

## 2023-11-19 MED ORDER — FAMOTIDINE IN NACL 20-0.9 MG/50ML-% IV SOLN
20.0000 mg | Freq: Two times a day (BID) | INTRAVENOUS | Status: DC
  Administered 2023-11-19 – 2023-11-20 (×2): 20 mg via INTRAVENOUS
  Filled 2023-11-19 (×2): qty 50

## 2023-11-19 MED ORDER — CHLORHEXIDINE GLUCONATE CLOTH 2 % EX PADS
6.0000 | MEDICATED_PAD | Freq: Every day | CUTANEOUS | Status: DC
Start: 2023-11-19 — End: 2023-12-03
  Administered 2023-11-19 – 2023-12-02 (×14): 6 via TOPICAL

## 2023-11-19 MED ORDER — MIRTAZAPINE 15 MG PO TABS
30.0000 mg | ORAL_TABLET | Freq: Every day | ORAL | Status: DC
  Administered 2023-11-19 – 2023-11-20 (×2): 30 mg
  Filled 2023-11-19 (×2): qty 2

## 2023-11-19 MED ORDER — POLYETHYLENE GLYCOL 3350 17 G PO PACK
17.0000 g | PACK | Freq: Every day | ORAL | Status: DC
  Administered 2023-11-20 – 2023-11-24 (×5): 17 g
  Filled 2023-11-19 (×5): qty 1

## 2023-11-19 MED ORDER — FENTANYL CITRATE PF 50 MCG/ML IJ SOSY
50.0000 ug | PREFILLED_SYRINGE | INTRAMUSCULAR | Status: DC | PRN
  Administered 2023-11-19 – 2023-11-22 (×2): 50 ug via INTRAVENOUS
  Filled 2023-11-19 (×3): qty 1

## 2023-11-19 MED ORDER — SODIUM CHLORIDE 0.9 % IV SOLN
250.0000 mL | INTRAVENOUS | Status: DC
  Administered 2023-11-19: 250 mL via INTRAVENOUS

## 2023-11-19 MED ORDER — HYDROXYCHLOROQUINE SULFATE 200 MG PO TABS
200.0000 mg | ORAL_TABLET | Freq: Two times a day (BID) | ORAL | Status: DC
  Administered 2023-11-19 – 2023-11-20 (×2): 200 mg
  Filled 2023-11-19 (×2): qty 1

## 2023-11-19 MED ORDER — FENTANYL 2500MCG IN NS 250ML (10MCG/ML) PREMIX INFUSION
25.0000 ug/h | INTRAVENOUS | Status: DC
  Administered 2023-11-19: 25 ug/h via INTRAVENOUS
  Administered 2023-11-20: 200 ug/h via INTRAVENOUS
  Administered 2023-11-20: 100 ug/h via INTRAVENOUS
  Filled 2023-11-19 (×3): qty 250

## 2023-11-19 MED ORDER — DOCUSATE SODIUM 50 MG/5ML PO LIQD
100.0000 mg | Freq: Two times a day (BID) | ORAL | Status: DC
  Administered 2023-11-19 – 2023-11-24 (×10): 100 mg
  Filled 2023-11-19 (×10): qty 10

## 2023-11-19 MED ORDER — HYDRALAZINE HCL 20 MG/ML IJ SOLN: 5.0000 mg | INTRAMUSCULAR | Status: DC | PRN

## 2023-11-19 MED ORDER — FENTANYL CITRATE PF 50 MCG/ML IJ SOSY: 25.0000 ug | PREFILLED_SYRINGE | Freq: Once | INTRAMUSCULAR | Status: DC

## 2023-11-19 MED ORDER — ALUM & MAG HYDROXIDE-SIMETH 200-200-20 MG/5ML PO SUSP: 30.0000 mL | ORAL | Status: DC | PRN

## 2023-11-19 MED ORDER — FUROSEMIDE 10 MG/ML IJ SOLN: 20.0000 mg | Freq: Once | INTRAMUSCULAR | Status: DC

## 2023-11-19 MED ORDER — VECURONIUM BROMIDE 10 MG IV SOLR
10.0000 mg | INTRAVENOUS | Status: DC | PRN
  Administered 2023-11-19: 10 mg via INTRAVENOUS

## 2023-11-19 MED ORDER — FUROSEMIDE 10 MG/ML IJ SOLN
40.0000 mg | Freq: Once | INTRAMUSCULAR | Status: AC
  Administered 2023-11-19: 40 mg via INTRAVENOUS
  Filled 2023-11-19: qty 4

## 2023-11-19 MED ORDER — OYSTER SHELL CALCIUM/D3 500-5 MG-MCG PO TABS
1.0000 | ORAL_TABLET | Freq: Three times a day (TID) | ORAL | Status: DC
  Administered 2023-11-19 – 2023-11-24 (×14): 1
  Filled 2023-11-19 (×14): qty 1

## 2023-11-19 NOTE — Plan of Care (Signed)
  Problem: Elimination: Goal: Will not experience complications related to bowel motility Outcome: Progressing   Problem: Pain Management: Goal: General experience of comfort will improve Outcome: Progressing   Problem: Health Behavior/Discharge Planning: Goal: Ability to manage health-related needs will improve Outcome: Not Progressing   Problem: Clinical Measurements: Goal: Ability to maintain clinical measurements within normal limits will improve Outcome: Not Progressing Goal: Will remain free from infection Outcome: Not Progressing Goal: Diagnostic test results will improve Outcome: Not Progressing Goal: Cardiovascular complication will be avoided Outcome: Not Progressing   Problem: Coping: Goal: Level of anxiety will decrease Outcome: Not Progressing   Problem: Elimination: Goal: Will not experience complications related to urinary retention Outcome: Not Progressing

## 2023-11-19 NOTE — Progress Notes (Signed)
Progress Note   Patient: Tonya Cummings:[id number] DOB: [birth year] DOA: 11/18/2023     1 DOS: the patient was seen and examined on 11/19/2023   Brief hospital course: 71yo with h/o stage 4 CKD, HFpEF, HTN, RA, and OSA not on CPAP who was admitted on 12/6 for recurrent R shoulder revision following arthroplasty.  Her last revision surgery was on 12/2.  Preoperative Hgb 7, WBC 10.8, MRSA PCR positive.  Intraoperatively, the shoulder appeared to be infected; periprosthetic fracture was repaired.  ID was consulted and recommended Daptomycin and Ceftriaxone.  Assessment and Plan:     Septic arthritis of R shoulder with Periprosthetic fracture around internal prosthetic right shoulder joint Complicated history of rotator cuff injury, now with periprosthetic fracture s/p repair, with intraoperative findings of purulent drainage consistent with septic arthritis. Has undergone 3 revisions Orthopedic surgery following ID consulted Daptomycin per pharmacy dosing Rocephin 2 g daily Blood cultures ordered Surgical cultures pending Will need PICC line placement once blood cultures result  Cardiac arrest Appears to have started as respiratory arrest after getting up a second time to the bedside commode (also had distress after initial bedside commode visit with recovery just prior to my initial evaluation) O2 sats dropped, patient became apneic, and arrested CPR for roughly 5-6 minutes, fully witnessed arrest Given Epi x 2, bicarb, calcium ROSC Remains intubated Transferred to ICU and Dr. Clovis Fredrickson service Concern for septic shock +/- hemorrhagic shock with known K+ 5.3 this AM possibly contributing; PE is also a consideration    Acute on chronic anemia History of CKD stage IV with associated anemia, baseline hemoglobin previously between 8 and 9 She received 1 unit intraoperatively, however postop CBC with hemoglobin of 6.7. Additional 1 unit of packed RBCs ordered Posttransfusion CBC with  Hgb only 7.1 Attempted to order 2 units, was told this scenario only allows for 1  Continue to transfuse for hemoglobin less than 7   (HFpEF) heart failure with preserved ejection fraction History of HFpEF with last EF of 55-60% in September 202 Given lasix this AM due to concern for hypervolemia She received the Lasix prior to the arrest, making this a less likely etiology   CKD (chronic kidney disease) stage 4, GFR 15-29 ml/min History of CKD stage IV with creatinine ranging between 1.8 and 2.7 over the last year Currently within baseline range   Hypertension Holding for now   Seropositive rheumatoid arthritis On Plaquenil   ILD (interstitial lung disease)  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 Resume Esbriet on discharge  Obesity Body mass index is 30.11 kg/m.Marland Kitchen  Weight loss should be encouraged Outpatient PCP/bariatric medicine f/u encouraged      Consultants: Orthopedics ID  Procedures: R reverse shoulder arthroplasty explant with open treatment of R humerus fracture and antibiotic deliver device insertion 12/6  Antibiotics: Cefazolin x 1 Ceftriaxone 12/6- Daptomycin 12/6-  30 Day Unplanned Readmission Risk Score    Flowsheet Row Admission (Current) from 11/18/2023 in [facility] (1A)  30 Day Unplanned Readmission Risk Score (%) 26.3 Filed at 11/19/2023 0400       This score is the patient's risk of an unplanned readmission within 30 days of being discharged (0 -100%). The score is based on dignosis, age, lab data, medications, orders, and past utilization.   Low:  0-14.9   Medium: 15-21.9   High: 22-29.9   Extreme: 30 and above           Subjective: At the time  of my initial evaluation this AM, she had been up to the bedside with a desaturation episode.  She was recovering with normalized O2 sats on [place] O2 but was still SOB with increased WOB.  She was given a dose of Lasix and 2 units PRBC ordered  (although blood bank reported that only 1 could be given) with Lasix between doses.  Her husband was not present but apparently was here after my visit and on his way home when she got up to the bedside commode again and had a respiratory arrest with O2 sats to 70s with HR to 120s. RRT called and she had apnea and then cardiac arrest.  CPR initiated, got 2 rounds of Epi, 1 round of bicarb and calcium.  She had ROSC with HR 140s and then was noted to be about 200 upon arrival to the ICU, intubated.    Head CT had been ordered because the OT reported that she had a fall a few days prior and hit her head, (L side), denies LOC, had a headache after the fall, said she is having some changes in vision (blurry) says she forgot to mention it prior.   Objective: Vitals:   11/18/23 2355 11/19/23 0427  BP: 120/61 (!) 146/74  Pulse: 91 94  Resp: 20 20  Temp: 98 F (36.7 C) 98.2 F (36.8 C)  SpO2: 90% 100%    Intake/Output Summary (Last 24 hours) at 11/19/2023 0745 Last data filed at 11/19/2023 9147 Gross per 24 hour  Intake 2520 ml  Output 2350 ml  Net 170 ml   Filed Weights   11/18/23 1140  Weight: 77.1 kg    Exam:  General:  Appears critically ill Eyes:  EOMI, normal lids, iris ENT:  grossly normal hearing, lips & tongue, mmm Neck:  + JVD on initial exam Cardiovascular:  RRR Respiratory:   Diffuse rhonchi.  Increased respiratory effort. Abdomen:  soft, NT, ND Skin:  no rash or induration seen on limited exam Musculoskeletal:  R shoulder in large brace Psychiatric:  blunted mood and affect, speech sparse and limited by respiratory effort Neurologic:  unable to effectively perform  Data Reviewed: I have reviewed the patient's lab results since admission.  Pertinent labs for today include:   K+ 5.6 Glucose 116 BUN 60/Creatinine 2.65/GFR 19 WBC 8.6 Hgb 7.1  Wound cultures with rare to few WBC, no orgs Blood cultures pending   Family Communication: I spoke with her husband by  telephone at the time of the arrest and he confirmed that she is full code  Disposition: Status is: Inpatient Remains inpatient appropriate because: critically ill     Time spent: 65 minutes  Unresulted Labs (From admission, onward)     Start     Ordered   11/26/23 0500  CK  Weekly,   TIMED     Comments: While on Daptomycin   Question:  Specimen collection method  Answer:  Lab=Lab collect  Comment:  cbc, bmp   11/19/23 0736   11/19/23 0500  HIV Antibody (routine testing w rflx)  (HIV Antibody (Routine testing w reflex) panel)  Tomorrow morning,   R       Question:  Specimen collection method  Answer:  Lab=Lab collect  Comment:  cbc, bmp   11/18/23 1831             Author: Jonah Blue, MD 11/19/2023 7:45 AM  For on call review [email or web address].

## 2023-11-19 NOTE — Progress Notes (Addendum)
PT Cancellation Note  Patient Details Name: Tonya Cummings MRN: [id number] DOB: 1951/10/12   Cancelled Treatment:    Reason Eval/Treat Not Completed: Medical issues which prohibited therapy Per OT pt struggled with most aspects of mobility, etc this AM, now slated for transfusion 2/2 HGB drop.  Will hold PT at this time, will maintain on caseload and we will attempt to see when appropriate.    Malachi Pro, DPT 11/19/2023, 11:19 AM  Addendum: code called and pt to CCU will complete PT orders at this time. Ortho PT orders = "NWB on operative extremity. Please instruct on pendulums; hand, wrist, and elbow RoM. Goal is to go home after PT on POD#1  PT eval and treat "

## 2023-11-19 NOTE — Consult Note (Signed)
NAME:  Tonya Cummings, MRN:  [id number], DOB:  10-Jun-1951, LOS: 1 ADMISSION DATE:  11/18/2023  CHIEF COMPLAINT:  acute cardiac arrest    History of Present Illness:   72 y.o. female with medical history significant of CKD Stage 4, HFpEF, HTN, B12 deficiency, anemia of CKD, rheumatoid arthritis, Raynaud's phenomenon, OSA not on CPAP, who presents to the hospital for a right shoulder arthroplasty.    Tonya Cummings states she was in her usual state of health when she presented to the hospital today for a scheduled procedure.  She notes that her right shoulder pain has acutely worsened approximately 2 days prior.  Otherwise, she denies any fever, chills, chest pain, shortness of breath, palpitations.  She endorses unchanged lower extremity swelling.    Hospital course: On to the hospital arrival, patient was hypertensive with blood pressure 147/65 and heart rate of 107.  She was saturating at 99% on room air.  She was afebrile at 97.9.  Preoperative blood work demonstrated hemoglobin of 7.0 and WBC of 10.8.  MRSA PCR positive.    Intraoperatively, it was noted that patient's joint appeared infected and cultures were obtained.  Infectious disease consulted and recommended daptomycin and Rocephin.  TRH consulted for admission.  SURGERY DATE: 11/18/2023     PRE-OP DIAGNOSIS:  1.  Right reverse shoulder arthroplasty loosening after periprosthetic fracture   POST-OP DIAGNOSIS:  1.  Right periprosthetic infection 2.  Right periprosthetic humerus fractures   PROCEDURES:  1. Right reverse shoulder arthroplasty explant of glenoid and humeral components 2. Open treatment of right humerus fracture with intramedullary device 3. Right humerus insertion of antibiotic deliver device     Tonya Cummings, patient was acute SOB with movement to tiolet CODE BLUE CALLED after bradycaria, CPR for 6 mins Patient was intubated on the floor and transferred to ICU for acute cardiac arrest and resp failure     Significant Hospital Events: Including procedures, antibiotic start and stop dates in addition to other pertinent events   12/6 admitted for infected RT shoulder 12/7 acute cardiac arrest      Micro Data:  CULTURES PENDING  Antimicrobials:   Antibiotics Given (last 72 hours)     Date/Time Action Medication Dose Rate   11/18/23 1457 Given   ceFAZolin (ANCEF) IVPB 2g/100 mL premix 2 g    11/18/23 1528 Given   vancomycin (VANCOCIN) powder 1,000 mg    11/18/23 1553 Given  [mixed with stimulan beeds]   gentamicin (GARAMYCIN) injection 80 mg    11/18/23 1558 Given  [mixed with cement]   vancomycin (VANCOCIN) powder 1,000 mg    11/18/23 1600 Given  [mixed with cement]   tobramycin (NEBCIN) powder 1.2 g    11/18/23 1602 Given  [mixed with cement]   gentamicin (GARAMYCIN) injection 80 mg    11/18/23 2110 Given   hydroxychloroquine (PLAQUENIL) tablet 200 mg 200 mg    11/18/23 2134 New Bag/Given   cefTRIAXone (ROCEPHIN) 2 g in sodium chloride 0.9 % 100 mL IVPB 2 g 200 mL/hr   11/18/23 2329 New Bag/Given   DAPTOmycin (CUBICIN) IVPB 500 mg/79mL premix 500 mg 100 mL/hr   11/19/23 1001 Given   hydroxychloroquine (PLAQUENIL) tablet 200 mg 200 mg           Objective   Blood pressure (!) 155/76, pulse 96, temperature 97.7 F (36.5 C), temperature source Oral, resp. rate 20, weight 77.1 kg, SpO2 100%.        Intake/Output Summary (Last 24 hours) at 11/19/2023  1307 Last data filed at 11/19/2023 1100 Gross per 24 hour  Intake 2520 ml  Output 2378 ml  Net 142 ml   Filed Weights   11/18/23 1140  Weight: 77.1 kg     REVIEW OF SYSTEMS  PATIENT IS UNABLE TO PROVIDE COMPLETE REVIEW OF SYSTEMS DUE TO SEVERE CRITICAL ILLNESS   PHYSICAL EXAMINATION:  GENERAL:critically ill appearing, +resp distress EYES: Pupils equal, round, reactive to light.  No scleral icterus.  MOUTH: Moist mucosal membrane. INTUBATED NECK: Supple.  PULMONARY: Lungs clear to auscultation, +rhonchi,  +wheezing CARDIOVASCULAR: S1 and S2.  Regular rate and rhythm GASTROINTESTINAL: Soft, nontender, -distended. Positive bowel sounds.  MUSCULOSKELETAL: RT SHOULDER in SLING, WOUND VAV in place NEUROLOGIC: obtunded,sedated SKIN:normal, warm to touch, Capillary refill delayed  Pulses present bilaterally   Labs/imaging that I havepersonally reviewed  (right click and "Reselect all SmartList Selections" daily)     ASSESSMENT AND PLAN SYNOPSIS  72 yo white female with Severe ACUTE Hypoxic and Hypercapnic Respiratory Failure and acute cardiac arrest with underlying septic arthritis and infected joint with acute on chronic renal failure,  assess for MI, PE, acute CVA work up pending  Severe ACUTE Hypoxic and Hypercapnic Respiratory Failure -continue Mechanical Ventilator support -Wean Fio2 and PEEP as tolerated -VAP/VENT bundle implementation - Wean PEEP & FiO2 as tolerated, maintain SpO2 > 88% - Head of bed elevated 30 degrees, VAP protocol in place - Plateau pressures less than 30 cm H20  - Intermittent chest x-ray & ABG PRN - Ensure adequate pulmonary hygiene  Will need to consider PE but can not obtain CT chest due to renal disease  Vent Mode: PRVC FiO2 (%):  [100 %] 100 % Set Rate:  [18 bmp] 18 bmp Vt Set:  [450 mL] 450 mL PEEP:  [5 cmH20] 5 cmH20   CARDIAC ARREST Check TROP, CK Check ECHO  CARDIAC ICU monitoring   ACUTE KIDNEY INJURY/Renal Failure on CKD stage 4 -continue Foley Catheter-assess need -Avoid nephrotoxic agents -Follow urine output, BMP -Ensure adequate renal perfusion, optimize oxygenation -Renal dose medications Plan for Trialysis   Intake/Output Summary (Last 24 hours) at 11/19/2023 1307 Last data filed at 11/19/2023 1100 Gross per 24 hour  Intake 2520 ml  Output 2378 ml  Net 142 ml     NEUROLOGY Acute  metabolic encephalopathy Sedation as needed  SEPTIC /CARDIOGENIC SHOCK SOURCE-infected shoulder, acute cardiac arrest -use vasopressors  to keep MAP>65 as needed -follow ABG and LA -follow up cultures -emperic ABX   INFECTIOUS DISEASE -continue antibiotics as prescribed -follow up cultures   ENDO - ICU hypoglycemic\Hyperglycemia protocol -check FSBS per protocol   GI GI PROPHYLAXIS as indicated  NUTRITIONAL STATUS DIET-->NPO Constipation protocol as indicated   ELECTROLYTES -follow labs as needed -replace as needed -pharmacy consultation and following   ACUTE ANEMIA- TRANSFUSE AS NEEDED CONSIDER TRANSFUSION  IF HGB<7 DVT PRX with TED/SCD's ONLY  WOUND RT SHOULDER JOINT INFECTION   Best practice (right click and "Reselect all SmartList Selections" daily)  Diet: NPO Pain/Anxiety/Delirium protocol (if indicated): Yes (RASS goal -1) VAP protocol (if indicated): Yes DVT prophylaxis: LMWH GI prophylaxis: H2B Foley:  Yes, and it is still needed Mobility:  bed rest  Code Status:  FULL Disposition:ICU  Labs   CBC: Recent Labs  Lab 11/18/23 1140 11/18/23 1748 11/18/23 1928 11/19/23 0216  WBC 10.8* 8.4 8.2 8.6  NEUTROABS  --   --   --  7.1  HGB 7.0* 6.7* 7.0* 7.1*  HCT 23.0* 21.6* 22.3* 23.4*  MCV  92.7 92.3 92.1 92.9  PLT 386 279 294 304    Basic Metabolic Panel: Recent Labs  Lab 11/18/23 1748 11/19/23 0216  NA 140 141  K 4.5 5.6*  CL 108 107  CO2 22 25  GLUCOSE 120* 116*  BUN 60* 60*  CREATININE 2.50* 2.65*  CALCIUM 8.2* 8.9   GFR: Estimated Creatinine Clearance: 19.4 mL/min (A) (by C-G formula based on SCr of 2.65 mg/dL (H)). Recent Labs  Lab 11/18/23 1140 11/18/23 1748 11/18/23 1928 11/19/23 0216  WBC 10.8* 8.4 8.2 8.6     Past Medical History:  She,  has a past medical history of Acute hypoxemic respiratory failure (HCC), Anemia in stage 4 chronic kidney disease (HCC), Anginal pain (HCC), Anxiety, Aortic atherosclerosis (HCC), CHF (congestive heart failure) (HCC), Chronic pain, Chronic radicular pain of lower back, CKD (chronic kidney disease), stage IV (HCC),  Complication of anesthesia, Coronary artery calcification seen on CT scan, Costochondritis, DDD (degenerative disc disease), lumbar, Depression, Dyspnea, GERD (gastroesophageal reflux disease), Hiatal hernia, Hip dysplasia, Hyperlipidemia, Hypertension, Insomnia, Iron deficiency, Iron deficiency anemia, Low back pain, Low vitamin B12 level, Lumbar facet joint pain, Migraines, Nose colonized with MRSA (10/03/2020), Numbness and tingling of right leg, OA (osteoarthritis), Obesity, OSA (obstructive sleep apnea), Osteoporosis, Pelvic fracture (HCC), Pneumonia, Pulmonary fibrosis (HCC), Raynaud's disease without gangrene, Restless leg syndrome, Rheumatoid arthritis (HCC), Right renal mass (05/28/2023), Seasonal allergies, and Thoracic compression fracture (HCC).   Surgical History:   Past Surgical History:  Procedure Laterality Date   ABDOMINAL SURGERY     pt denies   APPENDECTOMY     CARPAL TUNNEL RELEASE Bilateral    CHOLECYSTECTOMY     COLONOSCOPY WITH PROPOFOL N/A 08/15/2020   Procedure: COLONOSCOPY WITH PROPOFOL;  Surgeon: Earline Mayotte, MD;  Location: [facility];  Service: Endoscopy;  Laterality: N/A;   CYSTOSCOPY W/ RETROGRADES Right 07/18/2023   Procedure: CYSTOSCOPY WITH RETROGRADE PYELOGRAM;  Surgeon: [name], MD;  Location: [facility];  Service: Urology;  Laterality: Right;   DILATION AND CURETTAGE OF UTERUS     ESOPHAGOGASTRODUODENOSCOPY (EGD) WITH PROPOFOL N/A 08/15/2020   Procedure: ESOPHAGOGASTRODUODENOSCOPY (EGD) WITH PROPOFOL;  Surgeon: Earline Mayotte, MD;  Location: [facility];  Service: Endoscopy;  Laterality: N/A;   FRACTURE SURGERY     hip fracture    FRACTURE SURGERY     pelvic fracture plate    HIP SURGERY Left    KNEE ARTHROPLASTY Right 10/13/2020   Procedure: COMPUTER ASSISTED TOTAL KNEE ARTHROPLASTY - RNFA;  Surgeon: Donato Heinz, MD;  Location: [facility];  Service: Orthopedics;  Laterality: Right;   REVERSE SHOULDER ARTHROPLASTY Right 08/30/2023    Procedure: Right reverse shoulder arthroplasty, biceps tenodesis;  Surgeon: Signa Kell, MD;  Location: [facility];  Service: Orthopedics;  Laterality: Right;   TOTAL SHOULDER REVISION Right 10/03/2023   Procedure: Revision right reverse shoulder arthroplasty with conversion to long humeral stem and open reduction internal fixation of the humerus;  Surgeon: Signa Kell, MD;  Location: [facility];  Service: Orthopedics;  Laterality: Right;   TUBAL LIGATION     URETEROSCOPY  07/18/2023   Procedure: DIAGNOSTIC URETEROSCOPY;  Surgeon: [name], MD;  Location: [facility];  Service: Urology;;     Social History:   reports that she has never smoked. She has never used smokeless tobacco. She reports that she does not drink alcohol and does not use drugs.   Family History:  Her family history includes Aneurysm in her father; Cancer in her brother; Diabetes in her brother,  maternal grandfather, mother, and son; Heart disease in her maternal grandfather; Hypertension in her mother; Osteosarcoma in her brother; Seizures in her son.   Allergies Allergies  Allergen Reactions   Gabapentin Other (See Comments)    Weight gain   Ibuprofen Other (See Comments)    Headache     Home Medications  Prior to Admission medications   Medication Sig Start Date End Date Taking? Authorizing Provider  acetaminophen (TYLENOL) 500 MG tablet Take 2 tablets (1,000 mg total) by mouth every 6 (six) hours as needed. 08/31/23  Yes Anson Oregon, PA-C  aspirin EC 325 MG tablet Take 1 tablet (325 mg total) by mouth daily. 08/31/23  Yes Anson Oregon, PA-C  calcitRIOL (ROCALTROL) 0.25 MCG capsule Take 0.25 mcg by mouth every morning. 05/22/21  Yes [provider]  Calcium Carb-Cholecalciferol 600-400 MG-UNIT CAPS Take 1 capsule by mouth 3 (three) times daily. 01/13/10  Yes [provider]  chlorhexidine (HIBICLENS) 4 % external liquid Apply 15 mLs (1 Application total) topically as directed for 30  doses. Use as directed daily for 5 days every other week for 6 weeks. 11/18/23  Yes Signa Kell, MD  Cholecalciferol 25 MCG (1000 UT) tablet Take 1,000 Units by mouth every morning. 09/23/09  Yes [provider]  cyanocobalamin 1000 MCG tablet Take 1,000 mcg by mouth every morning.   Yes [provider]  cyclobenzaprine (FLEXERIL) 10 MG tablet Take 10 mg by mouth at bedtime as needed for muscle spasms (Leg cramps).   Yes [provider]  enalapril-hydrochlorothiazide (VASERETIC) 10-25 MG tablet Take 1 tablet by mouth at bedtime. 09/12/23  Yes Hammock, Lavonna Rua, NP  ferrous sulfate 325 (65 FE) MG tablet Take 325 mg by mouth daily with breakfast.   Yes [provider]  furosemide (LASIX) 40 MG tablet Take 1.5 tablets (60 mg total) by mouth every morning. Increase to 1.5 tablet (60 mg total) by mouth in morning and extra 1 tablet (40 mg total for maximum daily dose 100 mg) at lunch time as needed for up to 3 days for increased leg swelling, shortness of breath, weight gain 5+ lbs over 1-2 days. Seek medical care if these symptoms are not improving with increased dose. 09/02/23  Yes Sunnie Nielsen, DO  hydroxychloroquine (PLAQUENIL) 200 MG tablet Take 200 mg by mouth 2 (two) times daily. 06/22/21  Yes [provider]  mirtazapine (REMERON) 30 MG tablet Take 30 mg by mouth at bedtime. 06/29/22  Yes [provider]  Multiple Vitamins-Minerals (MULTIVITAMIN ADULT PO) Take 1 tablet by mouth every morning. 09/10/08  Yes [provider]  mupirocin ointment (BACTROBAN) 2 % Apply small about inside of both nostrils TWICE a day for the next 5 days. 08/25/23  Yes Verlee Monte, NP  mupirocin ointment (BACTROBAN) 2 % Place 1 Application into the nose 2 (two) times daily for 60 doses. Use as directed 2 times daily for 5 days every other week for 6 weeks. 11/18/23 12/18/23 Yes Signa Kell, MD  omeprazole (PRILOSEC) 40 MG capsule Take 40 mg by mouth every morning.    Yes [provider]  ondansetron (ZOFRAN) 4 MG tablet Take 1 tablet (4 mg total) by mouth every 6 (six) hours as needed for nausea. 08/31/23  Yes Anson Oregon, PA-C  oxyCODONE (OXY IR/ROXICODONE) 5 MG immediate release tablet Take 1 tablet (5 mg total) by mouth every 4 (four) hours as needed for moderate pain (pain score 4-6) (pain score 4-6). 10/04/23  Yes Lenard Forth,  Todd, PA-C  Pirfenidone 267 MG TABS Take 801 tablets by mouth 3 (three) times daily. 12/24/22  Yes [provider]  pramipexole (MIRAPEX) 0.125 MG tablet Take 0.125-0.25 mg by mouth See admin instructions. Take 0.125 mg in the morning and 0.25 mg  at bedtime 02/15/18  Yes [provider]  PROLIA 60 MG/ML SOSY injection Inject 60 mg into the skin every 6 (six) months. 04/06/23  Yes [provider]  rosuvastatin (CRESTOR) 5 MG tablet Take 5 mg by mouth every other day. 07/18/19  Yes [provider]  spironolactone (ALDACTONE) 25 MG tablet Take 25 mg by mouth at bedtime. 04/09/21  Yes [provider]  tetrahydrozoline 0.05 % ophthalmic solution Place 1 drop into both eyes daily as needed (allergies).   Yes [provider]  venlafaxine XR (EFFEXOR-XR) 150 MG 24 hr capsule Take 150 mg by mouth See admin instructions. Take with 75 mg for total 225 mg in the morning 12/28/22 12/28/23 Yes [provider]  venlafaxine XR (EFFEXOR-XR) 75 MG 24 hr capsule Take 75 mg by mouth See admin instructions. Take with 150 mg for a total of 225 mg in the morning 08/24/23  Yes [provider]  chlorhexidine (HIBICLENS) 4 % external liquid Apply 15 mLs (1 Application total) topically as directed for 30 doses. Use as directed daily for 5 days every other week for 6 weeks. Patient not taking: Reported on 11/17/2023 10/03/23   Signa Kell, MD      Critical Care Time devoted to patient care services described in this note is 75 minutes.  Critical care was necessary to treat /prevent imminent  and life-threatening deterioration.   PATIENT WITH VERY POOR PROGNOSIS I ANTICIPATE PROLONGED ICU LOS  Patient is critically ill. Patient with Multiorgan failure and at high risk for cardiac arrest and death.    Lucie Leather, M.D.  [facility]  Medical Director [facility] Medical Director [facility] Cardio-Pulmonary Department

## 2023-11-19 NOTE — Hospital Course (Signed)
71yo with h/o stage 4 CKD, HFpEF, HTN, RA, and OSA not on CPAP who was admitted on 12/6 for recurrent R shoulder revision following arthroplasty.  Her last revision surgery was on 12/2.  Preoperative Hgb 7, WBC 10.8, MRSA PCR positive.  Intraoperatively, the shoulder appeared to be infected; periprosthetic fracture was repaired.  ID was consulted and recommended Daptomycin and Ceftriaxone.

## 2023-11-19 NOTE — TOC Initial Note (Signed)
Transition of Care [facility]) - Initial/Assessment Note    Patient Details  Name: Tonya Cummings MRN: [id number] Date of Birth: 1951-07-27  Transition of [facility]) CM/SW Contact:    Liliana Cline, LCSW Phone Number: 11/19/2023, 11:24 AM  Clinical Narrative:                 Met with patient at bedside for high readmission risk assessment. Patient is from home with spouse who provides transportation. PCP is Lenon Oms. Pharmacy is mail order or [facility] [place]. Patient had HH in the  past, unknown agency. Patient has a cane, rollator, walker, bedside commode, wheelchair, and shower chair at home. Patient is not currently on home o2. Patient was recently set up with [facility] for OPPT and hopes to resume this at DC. PT evals are pending.  Expected Discharge Plan: OP Rehab Barriers to Discharge: Continued Medical Work up   Patient Goals and CMS Choice Patient states their goals for this hospitalization and ongoing recovery are:: home with spouse CMS Medicare.gov Compare Post Acute Care list provided to:: Patient Choice offered to / list presented to : Patient      Expected Discharge Plan and Services       Living arrangements for the past 2 months: Single Family Home                                      Prior Living Arrangements/Services Living arrangements for the past 2 months: Single Family Home Lives with:: Spouse Patient language and need for interpreter reviewed:: Yes Do you feel safe going back to the place where you live?: Yes      Need for Family Participation in Patient Care: Yes (Comment) Care giver support system in place?: Yes (comment) Current home services: DME Criminal Activity/Legal Involvement Pertinent to Current Situation/Hospitalization: No - Comment as needed  Activities of Daily Living      Permission Sought/Granted Permission sought to share information with : Facility Industrial/product designer granted to share  information with : Yes, Verbal Permission Granted     Permission granted to share info w AGENCY: as needed  Permission granted to share info w Relationship: spouse     Emotional Assessment       Orientation: : Oriented to Self, Oriented to Place, Oriented to  Time, Oriented to Situation Alcohol / Substance Use: Not Applicable Psych Involvement: No (comment)  Admission diagnosis:  Periprosthetic fracture around internal prosthetic right shoulder joint [M97.31XA] Septic arthritis (HCC) [M00.9] Patient Active Problem List   Diagnosis Date Noted   Septic arthritis (HCC) 11/18/2023   Acute on chronic anemia 11/18/2023   (HFpEF) heart failure with preserved ejection fraction (HCC) 11/18/2023   CKD (chronic kidney disease) stage 4, GFR 15-29 ml/min (HCC) 11/18/2023   ILD (interstitial lung disease) (HCC) 11/18/2023   Periprosthetic fracture around internal prosthetic right shoulder joint 10/03/2023   Renal mass, right 08/31/2023   Leukocytosis 08/31/2023   OSA (obstructive sleep apnea) 08/31/2023   Elevated troponin 08/31/2023   Leg pain, bilateral 08/31/2023   At risk for constipation 08/31/2023   Rotator cuff arthropathy 08/30/2023   Anemia in stage 4 chronic kidney disease (HCC) 01/10/2023   Long-term use of immunosuppressant medication 03/09/2022   Dyspnea 09/29/2021   Acute CHF (congestive heart failure) (HCC) 09/29/2021   Acute hypoxemic respiratory failure (HCC) 09/29/2021   Total knee replacement status 10/13/2020   CKD (  chronic kidney disease) stage 3, GFR 30-59 ml/min (HCC) 10/12/2020   Hyperlipidemia 10/12/2020   Hypertension 10/12/2020   Migraine headache 10/12/2020   Restless legs syndrome (RLS) 10/12/2020   Severe obesity (BMI 35.0-39.9) with comorbidity (HCC) 06/30/2020   Primary osteoarthritis of right knee 03/27/2020   Iron deficiency anemia 09/12/2019   Low vitamin B12 level 09/12/2019   Normocytic anemia 09/07/2019   Right medial knee pain 08/09/2018    Depression with anxiety 03/22/2018   Sleep disorder 03/22/2018   Influenza A 02/17/2018   Community acquired pneumonia 02/17/2018   Chronic radicular pain of lower back 02/06/2018   Numbness and tingling of right leg 02/06/2018   Hip dysplasia 02/23/2016   Osteoporosis, post-menopausal 02/23/2016   Raynaud's disease without gangrene 02/23/2016   Seropositive rheumatoid arthritis (HCC) 02/23/2016   Lumbar facet joint pain 09/18/2014   PCP:  Myrene Buddy, NP Pharmacy:   [facility] [phone] [facility],  - [address] AT [facility] [address] [place] [address] Phone: [phone] Fax: [phone]     Social Determinants of Health (SDOH) Social History: SDOH Screenings   Food Insecurity: No Food Insecurity (10/03/2023)  Housing: Low Risk  (10/03/2023)  Transportation Needs: No Transportation Needs (10/03/2023)  Utilities: Not At Risk (10/03/2023)  Depression (PHQ2-9): Low Risk  (09/02/2022)  Tobacco Use: Low Risk  (11/18/2023)   SDOH Interventions:     Readmission Risk Interventions    11/19/2023   11:23 AM 10/04/2023   10:38 AM 09/02/2023   10:04 AM  Readmission Risk Prevention Plan  Transportation Screening Complete Complete Complete  PCP or Specialist Appt within 3-5 Days Complete Complete Complete  HRI or Home Care Consult Complete  Complete  Social Work Consult for Recovery Care Planning/Counseling Complete Complete Complete  Palliative Care Screening Not Applicable Not Applicable Not Applicable  Medication Review Oceanographer) Complete Complete Complete

## 2023-11-19 NOTE — Progress Notes (Signed)
Notified MD of potassium of 5.6 by secure chat.

## 2023-11-19 NOTE — Progress Notes (Signed)
Occupational Therapy Evaluation Patient Details Name: Tonya Cummings MRN: [id number] DOB: 1951-01-22 Today's Date: 11/19/2023   History of Present Illness Pt s/p revision right reverse shoulder arthroplasty 12/06. PMH includes: knee arthroplasty 2021, R reverse shoulder arthroplasty Sept 2024 with revision Oct 2024, hypoxemic respiratory failure, pelvic fx, headaches, anxiety, depression, GERD, renal disease, arthritis, CKD, costochondritis, hip dysplasia, hyperlipidemia, HTN, lumbago, osteoporsosi, rheumatoid arthritis, seasonal allergies, sleep disorder, and thoracic compression fx.   Clinical Impression   Pt s/p R revision of reverse shoulder arthroplasty 12/06. Session scaled to pt tolerance due to pt feeling generally unwell and SOB upon exertion. On 2.5L O2 (typically does not use oxygen at home). Pt has had two other surgeries on R shoulder, so she verbalizes that she is familiar with immobilizer and polar care (OT did not instruct this session, will defer to next treatment). PTA, pt requires assist from spouse with ADLs and uses [facility] for mobility (spouse helps with transfers and stairs). Anticipate pt will currently require maxA for UB/LB ADLs. Performs bed mobility with minA, transfers with minA + light HHA to [facility]. Sp02 on 2.5L dropping to 91% with transfer to [facility], but 95%> rest of session. Pt would benefit from skilled OT services to address noted impairments and functional limitations (see below for any additional details) in order to maximize safety and independence while minimizing falls risk and caregiver burden. Anticipate the need for follow up OT services upon acute hospital DC.   Secure chat sent to MD regarding pt's report of fall 12/04 where she hit her head, no LOC but endorsing blurred vision. RN notified of pt's SOB and vital signs end of evaluation (see below for additional details).       If plan is discharge home, recommend the following: A little help with walking and/or  transfers;A lot of help with bathing/dressing/bathroom;Assistance with cooking/housework;Assistance with feeding;Help with stairs or ramp for entrance;Assist for transportation    Functional Status Assessment  Patient has had a recent decline in their functional status and demonstrates the ability to make significant improvements in function in a reasonable and predictable amount of time.  Equipment Recommendations  None recommended by OT    Recommendations for Other Services Other (comment)     Precautions / Restrictions Precautions Precautions: Shoulder Type of Shoulder Precautions: NWB, shoulder immobilizer on except for ADLs/exercises Shoulder Interventions: Shoulder sling/immobilizer;At all times;Off for dressing/bathing/exercises Precaution Comments: NWB Required Braces or Orthoses: Sling Restrictions Weight Bearing Restrictions: Yes RUE Weight Bearing: Non weight bearing      Mobility Bed Mobility Overal bed mobility: Needs Assistance Bed Mobility: Supine to Sit, Sit to Supine     Supine to sit: Min assist Sit to supine: Min assist   General bed mobility comments: increased time, encourged breathing techniques throughout. pt minimally SOB with activity, HR 90s, SpO2 95% on 2.5L    Transfers Overall transfer level: Needs assistance   Transfers: Bed to chair/wheelchair/BSC                    Balance Overall balance assessment: Mild deficits observed, not formally tested                                         ADL either performed or assessed with clinical judgement   ADL Overall ADL's : Needs assistance/impaired Eating/Feeding: Sitting;Minimal assistance   Grooming: Minimal assistance;Sitting   Upper Body Bathing: Maximal  assistance;Sitting   Lower Body Bathing: Maximal assistance;Sitting/lateral leans;Sit to/from stand   Upper Body Dressing : Total assistance;Sitting   Lower Body Dressing: Maximal assistance;Sit to/from  stand;Sitting/lateral leans   Toilet Transfer: Minimal assistance;Stand-pivot;BSC/3in1   Toileting- Clothing Manipulation and Hygiene: Sitting/lateral lean;Supervision/safety       Functional mobility during ADLs: Minimal assistance General ADL Comments: Pt with impaired functional use of RUE which limits bimanual tasks for ADL performance. Eval limited, performed to pt tolerance (moving to [facility] for urine void) and adjustment of polar care/shoulder immobilizer for comfort.     Vision Baseline Vision/History: 1 Wears glasses Ability to See in Adequate Light: 0 Adequate Patient Visual Report: Blurring of vision Vision Assessment?: Wears glasses for reading;Wears glasses for driving Additional Comments: Pt reporting new onset of vision blurriness since Wed, 12/4 (fall where she hit her head). Reads clock and name badge WNL            Pertinent Vitals/Pain Pain Assessment Pain Assessment: 0-10 Pain Score: 2  Pain Location: R shoulder Pain Descriptors / Indicators: Aching, Discomfort, Dull Pain Intervention(s): Limited activity within patient's tolerance, Monitored during session, Ice applied     Extremity/Trunk Assessment Upper Extremity Assessment Upper Extremity Assessment: RUE deficits/detail;Right hand dominant;Generalized weakness (3rd shoulder surgery) RUE: Unable to fully assess due to immobilization   Lower Extremity Assessment Lower Extremity Assessment: Generalized weakness   Cervical / Trunk Assessment Cervical / Trunk Assessment: Other exceptions (chronic R lateral flexion positioning (pt reports she was supposed to have surgery, and did not. has little AROM of cervical posture))   Communication Communication Communication: No apparent difficulties   Cognition Arousal: Alert Behavior During Therapy: WFL for tasks assessed/performed Overall Cognitive Status: Within Functional Limits for tasks assessed                                 General  Comments: A&Ox4     General Comments  MD in room upon arrival, reported okay to mobilize to pt tolerance (pt requesting to void). On 2.5L O2 throughout, pt does not use O2 at home. SpO2% dropping to 91%, pt slightly SOB upon exertion but reports improvement with breathing. Allowed time between transitions for rest breaks. RN notified of pt's current status, with pt mentioning that her chest felt "sore" with breathing. RN entered room for medication end of session, updated on vital signs (left at 95% O2 on 2.5L and HR 89). Secure chat sent to MD with pt reporting fall at home 12/04 where she hit her head. Pt denies LOC, but this writer is the first person she has told beside her husband. Bruising on L shoulder noted, pt reporting onset blurry vision since fall.            Home Living Family/patient expects to be discharged to:: Private residence Living Arrangements: Spouse/significant other;Children Available Help at Discharge: Family;Available 24 hours/day Type of Home: Mobile home Home Access: Stairs to enter Entrance Stairs-Number of Steps: 3 Entrance Stairs-Rails: Can reach both;Right;Left Home Layout: One level     Bathroom Shower/Tub: Producer, television/film/video: Standard Bathroom Accessibility: Yes   Home Equipment: Agricultural consultant (2 wheels);Cane - single point;BSC/3in1          Prior Functioning/Environment Prior Level of Function : Needs assist             Mobility Comments: husband has been helping with ambulation, stair navigation ADLs Comments: husband has been helping with  ADLs due to recenty shoulder surgeries        OT Problem List: Decreased strength;Decreased range of motion;Decreased activity tolerance;Impaired balance (sitting and/or standing);Impaired UE functional use;Pain;Cardiopulmonary status limiting activity;Decreased knowledge of use of DME or AE;Decreased knowledge of precautions      OT Treatment/Interventions: Self-care/ADL  training;Therapeutic exercise;Neuromuscular education;Energy conservation;DME and/or AE instruction;Therapeutic activities;Patient/family education;Balance training    OT Goals(Current goals can be found in the care plan section) Acute Rehab OT Goals OT Goal Formulation: With patient Time For Goal Achievement: 12/04/2023 Potential to Achieve Goals: Good  OT Frequency: Min 1X/week       AM-PAC OT "6 Clicks" Daily Activity     Outcome Measure Help from another person eating meals?: A Little Help from another person taking care of personal grooming?: A Little Help from another person toileting, which includes using toliet, bedpan, or urinal?: A Lot Help from another person bathing (including washing, rinsing, drying)?: A Lot Help from another person to put on and taking off regular upper body clothing?: A Lot Help from another person to put on and taking off regular lower body clothing?: A Lot 6 Click Score: 14   End of Session Equipment Utilized During Treatment: Other (comment) (shoulder immobilizer) Nurse Communication: Mobility status;Other (comment) (vital signs, report of recent fall and chest soreness)  Activity Tolerance: No increased pain;Other (comment) (eval consisted of minimal mobility due to pt feeling generally unwell and SOB with exertion, pt requests to use BSC and OT assisted.) Patient left: in bed;with SCD's reapplied;with bed alarm set;with call bell/phone within reach;with nursing/sitter in room (RN in room)  OT Visit Diagnosis: History of falling (Z91.81);Pain;Muscle weakness (generalized) (M62.81);Unsteadiness on feet (R26.81);Other abnormalities of gait and mobility (R26.89) Pain - Right/Left: Right Pain - part of body: Shoulder                Time: 1610-9604 OT Time Calculation (min): 29 min Charges:  OT General Charges $OT Visit: 1 Visit OT Evaluation $OT Eval Moderate Complexity: 1 Mod OT Treatments $Self Care/Home Management : 8-22 mins   Elton Heid L.  Hanif Radin, OTR/L  11/19/23, 3:02 PM

## 2023-11-19 NOTE — Procedures (Signed)
Central Venous Dailysis Catheter Placement: TRIPLE LUMEN    Indication(s) Hemo Dialysis/CRRT  Consent Written Risks of the procedure as well as the alternatives and risks of each were explained to the patient and/or caregiver.  Consent for the procedure was obtained and is signed in the bedside chart    Timeout Verified patient identification, verified procedure, site/side was marked, verified correct patient position, special equipment/implants available, medications/allergies/relevant history reviewed, required imaging and test results available. Patient comfort was obtained.     Sterile Technique Maximal sterile technique including full sterile barrier drape, hand hygiene, sterile gown, sterile gloves, mask, hair covering, sterile ultrasound probe cover (if used).   Hand washing performed prior to starting the procedure.    Procedure Description Area of catheter insertion was cleaned with chlorhexidine and draped in sterile fashion.  With real-time ultrasound guidance a central venous catheter was placed into the vein. Nonpulsatile blood flow and easy flushing noted in all ports.  The catheter was sutured in place and sterile dressing applied.   A triple lumen catheter was placed in LEFT Internal Jugular Vein There was good blood return, catheter caps were placed on lumens, catheter flushed easily, the line was secured and a sterile dressing and BIO-PATCH applied.   Ultrasound was used to visualize vasculature and guidance of needle.   Number of Attempts: 1 Complications:none  Estimated Blood Loss: none Operator: Maxtyn Nuzum.   Lucie Leather, M.D.  [facility]  Medical Director [facility] Medical Director [facility] Cardio-Pulmonary Department

## 2023-11-19 NOTE — Progress Notes (Signed)
Patient had two PIV's to left forearm, one which was not charted. An access was documented removed on left posterior FA 12/6. PIV to left posterior FA was infiltrated at SOS this morning and was removed. Patient has remaining 22 g to left anterior FA. This IV flushed well and lasix was administered through this site.

## 2023-11-19 NOTE — Progress Notes (Signed)
Subjective: 1 Day Post-Op Procedure(s) (LRB): Right revision reverse shoulder arthroplasty (conversion to longstem and glenosphere exchange) (Right) Patient reports pain as mild.   Patient is well, and has had no acute complaints or problems Denies any CP, SOB, ABD pain.  Objective: Vital signs in last 24 hours: Temp:  [97.1 F (36.2 C)-100.2 F (37.9 C)] 100.2 F (37.9 C) (12/07 0800) Pulse Rate:  [86-107] 87 (12/07 0800) Resp:  [17-31] 17 (12/07 0800) BP: (100-147)/(50-74) 131/61 (12/07 0800) SpO2:  [90 %-100 %] 97 % (12/07 0800) Weight:  [77.1 kg] 77.1 kg (12/06 1140)  Intake/Output from previous day: 12/06 0701 - 12/07 0700 In: 2520 [P.O.:420; I.V.:1000; Blood:300; IV Piggyback:800] Out: 2350 [Urine:2050; Drains:100; Blood:200] Intake/Output this shift: No intake/output data recorded.  Recent Labs    11/18/23 1140 11/18/23 1748 11/18/23 1928 11/19/23 0216  HGB 7.0* 6.7* 7.0* 7.1*   Recent Labs    11/18/23 1928 11/19/23 0216  WBC 8.2 8.6  RBC 2.42* 2.52*  HCT 22.3* 23.4*  PLT 294 304   Recent Labs    11/18/23 1748 11/19/23 0216  NA 140 141  K 4.5 5.6*  CL 108 107  CO2 22 25  BUN 60* 60*  CREATININE 2.50* 2.65*  GLUCOSE 120* 116*  CALCIUM 8.2* 8.9   No results for input(s): "LABPT", "INR" in the last 72 hours.  EXAM General - Patient is Alert, Appropriate, and Oriented Extremity - Neurovascular intact Sensation intact distally Intact pulses distally Dorsiflexion/Plantar flexion intact No cellulitis present Compartment soft Dressing - dressing C/D/I and no drainage, hemovac intact Motor Function - intact, moving foot and toes well on exam.   Past Medical History:  Diagnosis Date   Acute hypoxemic respiratory failure (HCC)    Anemia in stage 4 chronic kidney disease (HCC)    Anginal pain (HCC)    Anxiety    Aortic atherosclerosis (HCC)    CHF (congestive heart failure) (HCC)    a.) TTE 09/30/2021: EF 55-60%, no RWMAs, mild MR, G1DD    Chronic pain    Chronic radicular pain of lower back    CKD (chronic kidney disease), stage IV (HCC)    Complication of anesthesia    a.) delayed emergence following ureteroscopy 07/2023   Coronary artery calcification seen on CT scan    Costochondritis    DDD (degenerative disc disease), lumbar    Depression    Dyspnea    GERD (gastroesophageal reflux disease)    Hiatal hernia    Hip dysplasia    Hyperlipidemia    Hypertension    Insomnia    Iron deficiency    Iron deficiency anemia    Low back pain    Low vitamin B12 level    Lumbar facet joint pain    Migraines    Nose colonized with MRSA 10/03/2020   a.) preop PCR (+) 10/03/2020 prior to RIGHT TKA; b.) preop PCR (+) 08/25/2023 prior to RIGHT REVERSE SHOULDER ARTHROPLASTY; BICEPS TENODESIS   Numbness and tingling of right leg    OA (osteoarthritis)    Obesity    OSA (obstructive sleep apnea)    a.) not currently utilizing nocturnal PAP therapy; positional. PCCM feels as if symptom can be mitigated with diet/exercise/weight loss unless develops significant symptoms.   Osteoporosis    a.) recieves denosumab injections   Pelvic fracture (HCC)    Pneumonia    Pulmonary fibrosis (HCC)    Raynaud's disease without gangrene    Restless leg syndrome    a.)  on pramipexole + oral Fe supplementation   Rheumatoid arthritis (HCC)    a.) Tx'd with hydroxychloroquine   Right renal mass 05/28/2023   a.) CT renal 05/28/2023:  5.7 cm mass in the medial aspect of right kidney   Seasonal allergies    Thoracic compression fracture (HCC)     Assessment/Plan:   1 Day Post-Op Procedure(s) (LRB): Right revision reverse shoulder arthroplasty (conversion to longstem and glenosphere exchange) (Right) Principal Problem:   Periprosthetic fracture around internal prosthetic right shoulder joint Active Problems:   Hypertension   Seropositive rheumatoid arthritis (HCC)   Septic arthritis (HCC)   Acute on chronic anemia   (HFpEF) heart  failure with preserved ejection fraction (HCC)   CKD (chronic kidney disease) stage 4, GFR 15-29 ml/min (HCC)   ILD (interstitial lung disease) (HCC)  Estimated body mass index is 30.11 kg/m as calculated from the following:   Height as of 10/03/23: 5\' 3"  (1.6 m).   Weight as of this encounter: 77.1 kg. Advance diet Up with therapy Pain well controlled  Vital signs are stable, low grade temp, continue to monitor, encourage incentive spirometer  Labs are stable, Hgb 7.1, recheck hgb tomorrow  Remove hemovac tomorrow  Continue with IV abx, cultures pending  DVT Prophylaxis - Lovenox, TED hose, and SCDs    T. Cranston Neighbor, PA-C [facility] 11/19/2023, 10:37 AM

## 2023-11-19 NOTE — ED Provider Notes (Addendum)
A user error has taken place: charting done on wrong patient and has been corrected.        Marland Kitchen    .Critical Care  Performed by: Janith Lima, MD Authorized by: Janith Lima, MD   Date/Time: 11/19/2023 1:03 PM  Performed by: Janith Lima, MD        Janith Lima, MD 11/19/23 1323

## 2023-11-19 NOTE — Procedures (Signed)
Chest Tube Insertion: Indication:RT SIDED PTX FROM CHEST COMPRESSIONS  Consent:VERBAL FROM HUSBAND   Risks and benefits explained in detail including risk of infection, bleeding, respiratory failure and death..   Hand washing performed prior to starting the procedure.   Type of Anesthesia: 1 % Lidocaine.   Procedure: An active timeout was performed and correct patient, name, & ID confirmed.  After explaining risks and benefits, patient positioned correctly for chest tube placement. Patient was prepped in a sterile fashion including chlorohexadine preps, sterile drape, sterile gown and sterile gloves. Introducer needle was placed and guide wire was inserted. Using Seldinger Technique, the introducer needle was removed and three dilators were used to create an opening for insertion of chest tube. Chest tube sutured in place and proper dressing used..   Findings: Gush of fluid upon insetion of needle, Chest tube placed bewteen 5-7 ribs midaxiilary line.   Chest tube connected to: pleurovac .   Number of Attempts:1  Complications:None  Estimated Blood Loss:none  Chest radiograph indicated and ordered.   Operator: Loralie Malta   CXR SHOWED RT SIDED PTX-MOST LIKELY DUE TO CHEST COMPRESSIONS  Lucie Leather, M.D.  [facility]  Medical Director [facility] Medical Director [facility] Cardio-Pulmonary Department

## 2023-11-19 NOTE — Progress Notes (Signed)
Increase in temperature this am, 100.2. Patient states increased SOB, incentive spirometer given and instructed on use. Oxygen saturations 97% on 2 liters nasal cannula. MD notified by secure chat.

## 2023-11-19 NOTE — ED Provider Notes (Signed)
[facility] Department of Emergency Medicine   Code Blue CONSULT NOTE  Chief Complaint: Cardiac arrest/unresponsive   Level V Caveat: Unresponsive  History of present illness: I was contacted by the hospital for a CODE BLUE cardiac arrest upstairs and presented to the patient's bedside.   Admitted for right shoulder reconstructive surgery.  History of heart failure and CKD.  Anemia present.  ROS: Unable to obtain, Level V caveat  Scheduled Meds: Continuous Infusions: PRN Meds:. Past Medical History:  Diagnosis Date   Acute hypoxemic respiratory failure (HCC)    Anemia in stage 4 chronic kidney disease (HCC)    Anginal pain (HCC)    Anxiety    Aortic atherosclerosis (HCC)    CHF (congestive heart failure) (HCC)    a.) TTE 09/30/2021: EF 55-60%, no RWMAs, mild MR, G1DD   Chronic pain    Chronic radicular pain of lower back    CKD (chronic kidney disease), stage IV (HCC)    Complication of anesthesia    a.) delayed emergence following ureteroscopy 07/2023   Coronary artery calcification seen on CT scan    Costochondritis    DDD (degenerative disc disease), lumbar    Depression    Dyspnea    GERD (gastroesophageal reflux disease)    Hiatal hernia    Hip dysplasia    Hyperlipidemia    Hypertension    Insomnia    Iron deficiency    Iron deficiency anemia    Low back pain    Low vitamin B12 level    Lumbar facet joint pain    Migraines    Nose colonized with MRSA 10/03/2020   a.) preop PCR (+) 10/03/2020 prior to RIGHT TKA; b.) preop PCR (+) 08/25/2023 prior to RIGHT REVERSE SHOULDER ARTHROPLASTY; BICEPS TENODESIS   Numbness and tingling of right leg    OA (osteoarthritis)    Obesity    OSA (obstructive sleep apnea)    a.) not currently utilizing nocturnal PAP therapy; positional. PCCM feels as if symptom can be mitigated with diet/exercise/weight loss unless develops significant symptoms.   Osteoporosis    a.) recieves denosumab injections    Pelvic fracture (HCC)    Pneumonia    Pulmonary fibrosis (HCC)    Raynaud's disease without gangrene    Restless leg syndrome    a.) on pramipexole + oral Fe supplementation   Rheumatoid arthritis (HCC)    a.) Tx'd with hydroxychloroquine   Right renal mass 05/28/2023   a.) CT renal 05/28/2023:  5.7 cm mass in the medial aspect of right kidney   Seasonal allergies    Thoracic compression fracture [facility])    Past Surgical History:  Procedure Laterality Date   ABDOMINAL SURGERY     pt denies   APPENDECTOMY     CARPAL TUNNEL RELEASE Bilateral    CHOLECYSTECTOMY     COLONOSCOPY WITH PROPOFOL N/A 08/15/2020   Procedure: COLONOSCOPY WITH PROPOFOL;  Surgeon: Earline Mayotte, MD;  Location: [facility] ENDOSCOPY;  Service: Endoscopy;  Laterality: N/A;   CYSTOSCOPY W/ RETROGRADES Right 07/18/2023   Procedure: CYSTOSCOPY WITH RETROGRADE PYELOGRAM;  Surgeon: [name], MD;  Location: [facility] ORS;  Service: Urology;  Laterality: Right;   DILATION AND CURETTAGE OF UTERUS     ESOPHAGOGASTRODUODENOSCOPY (EGD) WITH PROPOFOL N/A 08/15/2020   Procedure: ESOPHAGOGASTRODUODENOSCOPY (EGD) WITH PROPOFOL;  Surgeon: Earline Mayotte, MD;  Location: [facility] ENDOSCOPY;  Service: Endoscopy;  Laterality: N/A;   FRACTURE SURGERY     hip fracture    FRACTURE  SURGERY     pelvic fracture plate    HIP SURGERY Left    KNEE ARTHROPLASTY Right 10/13/2020   Procedure: COMPUTER ASSISTED TOTAL KNEE ARTHROPLASTY - RNFA;  Surgeon: Donato Heinz, MD;  Location: [facility] ORS;  Service: Orthopedics;  Laterality: Right;   REVERSE SHOULDER ARTHROPLASTY Right 08/30/2023   Procedure: Right reverse shoulder arthroplasty, biceps tenodesis;  Surgeon: Signa Kell, MD;  Location: [facility] ORS;  Service: Orthopedics;  Laterality: Right;   TOTAL SHOULDER REVISION Right 10/03/2023   Procedure: Revision right reverse shoulder arthroplasty with conversion to long humeral stem and open reduction internal fixation of the humerus;  Surgeon: Signa Kell,  MD;  Location: [facility] ORS;  Service: Orthopedics;  Laterality: Right;   TUBAL LIGATION     URETEROSCOPY  07/18/2023   Procedure: DIAGNOSTIC URETEROSCOPY;  Surgeon: [name], MD;  Location: [facility] ORS;  Service: Urology;;   Social History   Socioeconomic History   Marital status: Married    Spouse name: Molly Maduro  [phone]   Number of children: Not on file   Years of education: Not on file   Highest education level: Not on file  Occupational History   Not on file  Tobacco Use   Smoking status: Never   Smokeless tobacco: Never  Vaping Use   Vaping status: Never Used  Substance and Sexual Activity   Alcohol use: No   Drug use: No   Sexual activity: Yes  Other Topics Concern   Not on file  Social History Narrative   Not on file   Social Determinants of Health   Financial Resource Strain: Not on file  Food Insecurity: No Food Insecurity (10/03/2023)   Hunger Vital Sign    Worried About Running Out of Food in the Last Year: Never true    Ran Out of Food in the Last Year: Never true  Transportation Needs: No Transportation Needs (10/03/2023)   PRAPARE - Administrator, Civil Service (Medical): No    Lack of Transportation (Non-Medical): No  Physical Activity: Not on file  Stress: Not on file  Social Connections: Not on file  Intimate Partner Violence: Not At Risk (10/03/2023)   Humiliation, Afraid, Rape, and Kick questionnaire    Fear of Current or Ex-Partner: No    Emotionally Abused: No    Physically Abused: No    Sexually Abused: No   Allergies  Allergen Reactions   Gabapentin Other (See Comments)    Weight gain   Ibuprofen Other (See Comments)    Headache    Last set of Vital Signs (not current) Vitals:   05/28/23 1136  BP: 124/63  Pulse: 93  Resp: 18  Temp: 98.3 F (36.8 C)  SpO2: 95%      Physical Exam Gen: unresponsive Cardiovascular: pulseless  Resp: apneic. Breath sounds equal bilaterally with bagging  Abd: nondistended  Neuro:  GCS 3, unresponsive to pain  HEENT: No blood in posterior pharynx, gag reflex absent  Neck: No crepitus  Musculoskeletal: Right shoulder in postoperative sling. Skin: warm  Procedures (when applicable, including Critical Care time): .Critical Care  Performed by: Janith Lima, MD Authorized by: Janith Lima, MD   Critical care provider statement:    Critical care time (minutes):  30   Critical care was time spent personally by me on the following activities:  Development of treatment plan with patient or surrogate, discussions with consultants, evaluation of patient's response to treatment, examination of patient, ordering and review of laboratory  studies, ordering and review of radiographic studies, ordering and performing treatments and interventions, pulse oximetry, re-evaluation of patient's condition and review of old charts Procedure Name: Intubation Date/Time: 11/19/2023 1:03 PM  Performed by: Janith Lima, MDPre-anesthesia Checklist: Patient identified, Patient being monitored, Emergency Drugs available, Timeout performed and Suction available Oxygen Delivery Method: Non-rebreather mask Preoxygenation: Pre-oxygenation with 100% oxygen Induction Type: Rapid sequence Ventilation: Mask ventilation without difficulty Laryngoscope Size: Glidescope and 3 Tube size: 7.5 mm Number of attempts: 1 Placement Confirmation: ETT inserted through vocal cords under direct vision, CO2 detector and Breath sounds checked- equal and bilateral Secured at: 22 cm Tube secured with: ETT holder Difficulty Due To: Difficulty was unanticipated       MDM / Assessment and Plan Patient is a 72 year old female who I responded to a CODE BLUE on the floor.  On arrival, CPR in progress for approximately 2 minutes.  1 mg epi already given.  During first pulse check, palpable pulse was detected with ROSC achieved.  Patient still unresponsive and not breathing on her own.  Patient was intubated at this  time.  No paralytic or sedative needed.  Pulse became undetectable again and CPR was resumed.  Patient was given 1 mg epi, amp of bicarb, and 1 g calcium.  ROSC achieved after approximately 3 additional minutes for total downtime of 5 minutes.  Patient was transported to the ICU at this time.  Handoff given to ICU team.   Janith Lima, MD 11/19/23 1304    Janith Lima, MD 11/19/23 1323

## 2023-11-19 NOTE — Progress Notes (Signed)
   11/19/23 1300  Spiritual Encounters  Type of Visit Initial  Referral source Code page  Reason for visit Code  OnCall Visit Yes   At 1232 Chaplain responded to Rapid Response Code. At 1239 it became a Code Blue. Chaplain remained available. No family present. Patient transferred to ICU. Chaplain services available if family arrives.

## 2023-11-19 NOTE — Progress Notes (Addendum)
Patient expecting transport to [place] and requested to use [facility] before they arrived. Patient was assisted to [facility] and had small amount of urine. Assisted back to bed and patient became very SOB. This was the second episode of her becoming SOB this am with transfer to [facility]. The first was with PT/OT. Patient recovered after the first episode and was given lasix IV as ordered buy MD. This episode patient could not "catch her breath",nurse began to message MD for further actions. Oxygen saturation initially 97% on 3 liters of oxygen and began to decrease to  89% after a few minutes of her sitting on the side of bed. Patient would not lay down. It is to note blood transfusion had been infusing for approximately one hour, blood transfusion stopped. Patient began to become very anxious, nurse called RRT and placed patient on 100% non-rebreather. Oxygen saturations 73% when RRT arrived. Patient declined and Code Blue was initiated, patient intubated, ROSC achieved and patient transferred to ICU. Report given. Patient belongings and polar pak taken to ICU. MD has been in contact with patient's husband. Nurse contacted blood bank to inquire about the unit of blood that was discontinued. Awaiting reply from MD if blood transfusion reaction work up is necessary.

## 2023-11-20 ENCOUNTER — Inpatient Hospital Stay: Payer: Medicare HMO

## 2023-11-20 ENCOUNTER — Inpatient Hospital Stay (HOSPITAL_COMMUNITY)
Admission: RE | Admit: 2023-11-20 | Discharge: 2023-11-20 | Disposition: A | Discharge status: Home / Self Care | Payer: Medicare HMO | Source: Home / Self Care | Attending: Internal Medicine | Admitting: Internal Medicine

## 2023-11-20 DIAGNOSIS — I469 Cardiac arrest, cause unspecified: Secondary | ICD-10-CM | POA: Diagnosis not present

## 2023-11-20 DIAGNOSIS — Z22322 Carrier or suspected carrier of Methicillin resistant Staphylococcus aureus: Secondary | ICD-10-CM | POA: Diagnosis not present

## 2023-11-20 DIAGNOSIS — N184 Chronic kidney disease, stage 4 (severe): Secondary | ICD-10-CM | POA: Diagnosis not present

## 2023-11-20 DIAGNOSIS — M9731XA Periprosthetic fracture around internal prosthetic right shoulder joint, initial encounter: Secondary | ICD-10-CM | POA: Diagnosis not present

## 2023-11-20 LAB — CBC
HCT: 24.6 % — ABNORMAL LOW (ref 36.0–46.0)
Hemoglobin: 7.6 g/dL — ABNORMAL LOW (ref 12.0–15.0)
MCH: 28.1 pg (ref 26.0–34.0)
MCHC: 30.9 g/dL (ref 30.0–36.0)
MCV: 91.1 fL (ref 80.0–100.0)
Platelets: 275 10*3/uL (ref 150–400)
RBC: 2.7 MIL/uL — ABNORMAL LOW (ref 3.87–5.11)
RDW: 16.1 % — ABNORMAL HIGH (ref 11.5–15.5)
WBC: 22.6 10*3/uL — ABNORMAL HIGH (ref 4.0–10.5)
nRBC: 0 % (ref 0.0–0.2)

## 2023-11-20 LAB — TYPE AND SCREEN
ABO/RH(D): O POS
Antibody Screen: NEGATIVE
Unit division: 0
Unit division: 0

## 2023-11-20 LAB — COMPREHENSIVE METABOLIC PANEL
ALT: 41 U/L (ref 0–44)
AST: 102 U/L — ABNORMAL HIGH (ref 15–41)
Albumin: 2.6 g/dL — ABNORMAL LOW (ref 3.5–5.0)
Alkaline Phosphatase: 115 U/L (ref 38–126)
Anion gap: 10 (ref 5–15)
BUN: 73 mg/dL — ABNORMAL HIGH (ref 8–23)
CO2: 24 mmol/L (ref 22–32)
Calcium: 8.8 mg/dL — ABNORMAL LOW (ref 8.9–10.3)
Chloride: 104 mmol/L (ref 98–111)
Creatinine, Ser: 2.8 mg/dL — ABNORMAL HIGH (ref 0.44–1.00)
GFR, Estimated: 18 mL/min — ABNORMAL LOW (ref >60)
Glucose, Bld: 103 mg/dL — ABNORMAL HIGH (ref 70–99)
Potassium: 5.1 mmol/L (ref 3.5–5.1)
Sodium: 138 mmol/L (ref 135–145)
Total Bilirubin: 0.7 mg/dL (ref <1.2)
Total Protein: 6.3 g/dL — ABNORMAL LOW (ref 6.5–8.1)

## 2023-11-20 LAB — ECHOCARDIOGRAM COMPLETE
AR max vel: 1.58 cm2
AV Area VTI: 1.88 cm2
AV Area mean vel: 1.66 cm2
AV Mean grad: 5 mm[Hg]
AV Peak grad: 11 mm[Hg]
Ao pk vel: 1.66 m/s
Area-P 1/2: 5.34 cm2
MV VTI: 0.83 cm2
S' Lateral: 3.1 cm
Weight: 2987.67 [oz_av]

## 2023-11-20 LAB — TROPONIN I (HIGH SENSITIVITY)
Troponin I (High Sensitivity): 804 ng/L (ref <18)
Troponin I (High Sensitivity): 293 ng/L (ref <18)

## 2023-11-20 LAB — BPAM RBC
Blood Product Expiration Date: 202501032359
Blood Product Expiration Date: 202501032359
ISSUE DATE / TIME: 202412061349
ISSUE DATE / TIME: 202412071128
Unit Type and Rh: 202501032359
Unit Type and Rh: 5100
Unit Type and Rh: 5100

## 2023-11-20 LAB — BASIC METABOLIC PANEL
Anion gap: 10 (ref 5–15)
BUN: 67 mg/dL — ABNORMAL HIGH (ref 8–23)
CO2: 23 mmol/L (ref 22–32)
Calcium: 8.5 mg/dL — ABNORMAL LOW (ref 8.9–10.3)
Chloride: 104 mmol/L (ref 98–111)
Creatinine, Ser: 2.79 mg/dL — ABNORMAL HIGH (ref 0.44–1.00)
GFR, Estimated: 18 mL/min — ABNORMAL LOW (ref >60)
Glucose, Bld: 112 mg/dL — ABNORMAL HIGH (ref 70–99)
Potassium: 3.9 mmol/L (ref 3.5–5.1)
Sodium: 137 mmol/L (ref 135–145)

## 2023-11-20 LAB — HEMOGLOBIN AND HEMATOCRIT, BLOOD
HCT: 23.9 % — ABNORMAL LOW (ref 36.0–46.0)
Hemoglobin: 7.6 g/dL — ABNORMAL LOW (ref 12.0–15.0)

## 2023-11-20 LAB — PHOSPHORUS: Phosphorus: 4.9 mg/dL — ABNORMAL HIGH (ref 2.5–4.6)

## 2023-11-20 LAB — GLUCOSE, CAPILLARY: Glucose-Capillary: 87 mg/dL (ref 70–99)

## 2023-11-20 LAB — MAGNESIUM: Magnesium: 2.1 mg/dL (ref 1.7–2.4)

## 2023-11-20 MED ORDER — VITAMIN D 25 MCG (1000 UNIT) PO TABS
1000.0000 [IU] | ORAL_TABLET | Freq: Every morning | ORAL | Status: DC
  Administered 2023-11-21 – 2023-11-24 (×4): 1000 [IU]
  Filled 2023-11-20 (×5): qty 1

## 2023-11-20 MED ORDER — DEXMEDETOMIDINE HCL IN NACL 400 MCG/100ML IV SOLN
0.0000 ug/kg/h | INTRAVENOUS | Status: DC
Start: 2023-11-20 — End: 2023-11-22
  Administered 2023-11-20: 0.7 ug/kg/h via INTRAVENOUS
  Administered 2023-11-21: 0.5 ug/kg/h via INTRAVENOUS
  Administered 2023-11-21: 0.7 ug/kg/h via INTRAVENOUS
  Filled 2023-11-20 (×4): qty 100

## 2023-11-20 MED ORDER — CALCITRIOL 0.25 MCG PO CAPS
0.2500 ug | ORAL_CAPSULE | Freq: Every morning | ORAL | Status: DC
  Administered 2023-11-21 – 2023-11-24 (×3): 0.25 ug
  Filled 2023-11-20 (×5): qty 1

## 2023-11-20 MED ORDER — AMIODARONE HCL IN DEXTROSE 360-4.14 MG/200ML-% IV SOLN
60.0000 mg/h | INTRAVENOUS | Status: AC
  Administered 2023-11-20: 60 mg/h via INTRAVENOUS
  Filled 2023-11-20: qty 200

## 2023-11-20 MED ORDER — FAMOTIDINE IN NACL 20-0.9 MG/50ML-% IV SOLN
20.0000 mg | INTRAVENOUS | Status: DC
  Administered 2023-11-21 – 2023-11-22 (×2): 20 mg via INTRAVENOUS
  Filled 2023-11-20 (×3): qty 50

## 2023-11-20 MED ORDER — SODIUM ZIRCONIUM CYCLOSILICATE 5 G PO PACK
10.0000 g | PACK | Freq: Three times a day (TID) | ORAL | Status: AC
  Administered 2023-11-20 (×3): 10 g
  Filled 2023-11-20 (×3): qty 2

## 2023-11-20 MED ORDER — DEXMEDETOMIDINE HCL IN NACL 400 MCG/100ML IV SOLN
INTRAVENOUS | Status: AC
  Administered 2023-11-20: 0.4 ug/kg/h via INTRAVENOUS
  Filled 2023-11-20: qty 100

## 2023-11-20 MED ORDER — AMIODARONE HCL IN DEXTROSE 360-4.14 MG/200ML-% IV SOLN
30.0000 mg/h | INTRAVENOUS | Status: DC
  Administered 2023-11-20: 30 mg/h via INTRAVENOUS
  Filled 2023-11-20 (×2): qty 200

## 2023-11-20 MED ORDER — AMIODARONE IV BOLUS ONLY 150 MG/100ML
150.0000 mg | Freq: Once | INTRAVENOUS | Status: AC
  Administered 2023-11-20: 150 mg via INTRAVENOUS
  Filled 2023-11-20: qty 100

## 2023-11-20 MED ORDER — VITAMIN B-12 1000 MCG PO TABS
1000.0000 ug | ORAL_TABLET | Freq: Every morning | ORAL | Status: DC
  Administered 2023-11-21 – 2023-11-24 (×4): 1000 ug
  Filled 2023-11-20 (×4): qty 1

## 2023-11-20 MED ORDER — FUROSEMIDE 20 MG PO TABS
60.0000 mg | ORAL_TABLET | Freq: Every morning | ORAL | Status: DC
  Administered 2023-11-21: 60 mg
  Filled 2023-11-20: qty 3

## 2023-11-20 MED ORDER — ASPIRIN 325 MG PO TABS
325.0000 mg | ORAL_TABLET | Freq: Every day | ORAL | Status: DC
Start: 2023-11-20 — End: 2023-11-24
  Administered 2023-11-20 – 2023-11-24 (×5): 325 mg
  Filled 2023-11-20 (×5): qty 1

## 2023-11-20 MED ORDER — FERROUS SULFATE 300 (60 FE) MG/5ML PO SOLN
300.0000 mg | Freq: Every day | ORAL | Status: DC
  Administered 2023-11-21: 300 mg
  Filled 2023-11-20 (×3): qty 5

## 2023-11-20 MED ORDER — ROSUVASTATIN CALCIUM 10 MG PO TABS
5.0000 mg | ORAL_TABLET | ORAL | Status: DC
  Administered 2023-11-21: 5 mg
  Filled 2023-11-20 (×2): qty 1

## 2023-11-20 MED ORDER — LACTATED RINGERS IV BOLUS
1000.0000 mL | Freq: Once | INTRAVENOUS | Status: AC
  Administered 2023-11-20: 1000 mL via INTRAVENOUS

## 2023-11-20 MED ORDER — ADULT MULTIVITAMIN W/MINERALS CH: 1.0000 | ORAL_TABLET | Freq: Every morning | ORAL | Status: DC

## 2023-11-20 NOTE — Progress Notes (Signed)
  Echocardiogram 2D Echocardiogram has been performed.  Tonya Cummings 11/20/2023, 3:28 PM

## 2023-11-20 NOTE — Progress Notes (Signed)
Subjective: 2 Days Post-Op Procedure(s) (LRB): Right revision reverse shoulder arthroplasty (conversion to longstem and glenosphere exchange) (Right) Patient in the ICU.  Patient is intubated, with chest tube.  Status postcardiac arrest with tension pneumothorax.  Objective: Vital signs in last 24 hours: Temp:  [97.3 F (36.3 C)-98.3 F (36.8 C)] 97.8 F (36.6 C) (12/08 0800) Pulse Rate:  [67-102] 71 (12/08 0900) Resp:  [10-24] 18 (12/08 0900) BP: (78-155)/(46-85) 115/65 (12/08 0900) SpO2:  [87 %-100 %] 95 % (12/08 0900) FiO2 (%):  [40 %-100 %] 40 % (12/08 1107) Weight:  [84.7 kg] 84.7 kg (12/08 0459)  Intake/Output from previous day: 12/07 0701 - 12/08 0700 In: 1041 [I.V.:619; Blood:122; IV Piggyback:300] Out: 1358 [Urine:965; Drains:143; Chest Tube:250] Intake/Output this shift: Total I/O In: 296.1 [I.V.:176.1; NG/GT:120] Out: 175 [Urine:175]  Recent Labs    11/18/23 1928 11/19/23 0216 11/19/23 1310 11/19/23 2039 11/20/23 0252  HGB 7.0* 7.1* 8.7* 8.0* 7.6*   Recent Labs    11/19/23 2039 11/20/23 0252  WBC 27.5* 22.6*  RBC 2.80* 2.70*  HCT 25.8* 24.6*  PLT 327 275   Recent Labs    11/19/23 2039 11/20/23 0252  NA 140 138  K 5.2* 5.1  CL 106 104  CO2 24 24  BUN 71* 73*  CREATININE 2.83* 2.80*  GLUCOSE 121* 103*  CALCIUM 8.9 8.8*   Recent Labs    11/19/23 1310  INR 1.4*    EXAM General - Patient is intubated, nonresponsive Extremity - Intact pulses distally No cellulitis present Compartment soft New dressing applied Dressing - dressing C/D/I and no drainage, hemovac removed   Past Medical History:  Diagnosis Date   Acute hypoxemic respiratory failure (HCC)    Anemia in stage 4 chronic kidney disease (HCC)    Anginal pain (HCC)    Anxiety    Aortic atherosclerosis (HCC)    CHF (congestive heart failure) (HCC)    a.) TTE 09/30/2021: EF 55-60%, no RWMAs, mild MR, G1DD   Chronic pain    Chronic radicular pain of lower back    CKD  (chronic kidney disease), stage IV (HCC)    Complication of anesthesia    a.) delayed emergence following ureteroscopy 07/2023   Coronary artery calcification seen on CT scan    Costochondritis    DDD (degenerative disc disease), lumbar    Depression    Dyspnea    GERD (gastroesophageal reflux disease)    Hiatal hernia    Hip dysplasia    Hyperlipidemia    Hypertension    Insomnia    Iron deficiency    Iron deficiency anemia    Low back pain    Low vitamin B12 level    Lumbar facet joint pain    Migraines    Nose colonized with MRSA 10/03/2020   a.) preop PCR (+) 10/03/2020 prior to RIGHT TKA; b.) preop PCR (+) 08/25/2023 prior to RIGHT REVERSE SHOULDER ARTHROPLASTY; BICEPS TENODESIS   Numbness and tingling of right leg    OA (osteoarthritis)    Obesity    OSA (obstructive sleep apnea)    a.) not currently utilizing nocturnal PAP therapy; positional. PCCM feels as if symptom can be mitigated with diet/exercise/weight loss unless develops significant symptoms.   Osteoporosis    a.) recieves denosumab injections   Pelvic fracture (HCC)    Pneumonia    Pulmonary fibrosis (HCC)    Raynaud's disease without gangrene    Restless leg syndrome    a.) on pramipexole + oral Fe  supplementation   Rheumatoid arthritis (HCC)    a.) Tx'd with hydroxychloroquine   Right renal mass 05/28/2023   a.) CT renal 05/28/2023:  5.7 cm mass in the medial aspect of right kidney   Seasonal allergies    Thoracic compression fracture (HCC)     Assessment/Plan:   2 Days Post-Op Procedure(s) (LRB): Right revision reverse shoulder arthroplasty (conversion to longstem and glenosphere exchange) (Right) Principal Problem:   Periprosthetic fracture around internal prosthetic right shoulder joint Active Problems:   Hypertension   Seropositive rheumatoid arthritis (HCC)   Septic arthritis (HCC)   Acute on chronic anemia   (HFpEF) heart failure with preserved ejection fraction (HCC)   CKD (chronic  kidney disease) stage 4, GFR 15-29 ml/min (HCC)   ILD (interstitial lung disease) (HCC)   Tension pneumothorax  Estimated body mass index is 33.08 kg/m as calculated from the following:   Height as of 10/03/23: 5\' 3"  (1.6 m).   Weight as of this encounter: 84.7 kg.  Vital signs are stable  Labs are stable, Hgb 7.6, recheck hgb tomorrow  Continue with IV abx, cultures pending  DVT Prophylaxis - Lovenox, TED hose, and SCDs    T. Cranston Neighbor, PA-C [facility] 11/20/2023, 11:15 AM

## 2023-11-20 NOTE — Progress Notes (Addendum)
PHARMACY CONSULT NOTE  Pharmacy Consult for Electrolyte Monitoring and Replacement   Recent Labs: Potassium (mmol/L)  Date Value  11/20/2023 5.1   Magnesium (mg/dL)  Date Value  47/82/9562 2.1   Calcium (mg/dL)  Date Value  13/07/6577 8.8 (L)   Albumin (g/dL)  Date Value  46/96/2952 2.6 (L)   Phosphorus (mg/dL)  Date Value  84/13/2440 4.9 (H)   Sodium (mmol/L)  Date Value  11/20/2023 138  09/12/2023 142    Assessment: 72 y.o. female with medical history significant of CKD Stage 4, HFpEF, HTN, B12 deficiency, anemia of CKD, rheumatoid arthritis, Raynaud's phenomenon, OSA not on CPAP, who was admitted on 12/6 for recurrent R shoulder revision following arthroplasty.   Diuretics: furosemide 60 mg per tube once daily  Goal of Therapy:  Electrolytes WNL  Plan:  ---no electrolyte replacement warranted for today ---recheck electrolytes in am  Lowella Bandy ,PharmD Clinical Pharmacist 11/20/2023 6:31 AM

## 2023-11-20 NOTE — Progress Notes (Signed)
NAME:  Tonya Cummings, MRN:  [id number], DOB:  [birth year], LOS: 2 ADMISSION DATE:  11/18/2023  CHIEF COMPLAINT:  acute cardiac arrest    History of Present Illness:   72 y.o. female with medical history significant of CKD Stage 4, HFpEF, HTN, B12 deficiency, anemia of CKD, rheumatoid arthritis, Raynaud's phenomenon, OSA not on CPAP, who presents to the hospital for a right shoulder arthroplasty.    Tonya Cummings states she was in her usual state of health when she presented to the hospital today for a scheduled procedure.  She notes that her right shoulder pain has acutely worsened approximately 2 days prior.  Otherwise, she denies any fever, chills, chest pain, shortness of breath, palpitations.  She endorses unchanged lower extremity swelling.    Hospital course: On to the hospital arrival, patient was hypertensive with blood pressure 147/65 and heart rate of 107.  She was saturating at 99% on room air.  She was afebrile at 97.9.  Preoperative blood work demonstrated hemoglobin of 7.0 and WBC of 10.8.  MRSA PCR positive.    Intraoperatively, it was noted that patient's joint appeared infected and cultures were obtained.  Infectious disease consulted and recommended daptomycin and Rocephin.  TRH consulted for admission.  SURGERY DATE: 11/18/2023     PRE-OP DIAGNOSIS:  1.  Right reverse shoulder arthroplasty loosening after periprosthetic fracture   POST-OP DIAGNOSIS:  1.  Right periprosthetic infection 2.  Right periprosthetic humerus fractures   PROCEDURES:  1. Right reverse shoulder arthroplasty explant of glenoid and humeral components 2. Open treatment of right humerus fracture with intramedullary device 3. Right humerus insertion of antibiotic deliver device     Toady, patient was acute SOB with movement to tiolet CODE BLUE CALLED after bradycaria, CPR for 6 mins Patient was intubated on the floor and transferred to ICU for acute cardiac arrest and resp failure     Significant Hospital Events: Including procedures, antibiotic start and stop dates in addition to other pertinent events   12/6 admitted for infected RT shoulder 12/7 acute cardiac arrest  12/7 LEFT VASC CATH placed-Trialysis 12/7 RT sided PTX on CXR, RT sided chest tube placed       OVERNIGHT EVENTS Remains intubated On pressors Remains critically ill  Vent Mode: PRVC FiO2 (%):  [50 %-100 %] 50 % Set Rate:  [18 bmp-188 bmp] 18 bmp Vt Set:  [450 mL-500 mL] 500 mL PEEP:  [5 cmH20] 5 cmH20 Plateau Pressure:  [20 cmH20] 20 cmH20   Micro Data:  CULTURES PENDING  Antimicrobials:   Antibiotics Given (last 72 hours)     Date/Time Action Medication Dose Rate   11/18/23 1457 Given   ceFAZolin (ANCEF) IVPB 2g/100 mL premix 2 g    11/18/23 1528 Given   vancomycin (VANCOCIN) powder 1,000 mg    11/18/23 1553 Given  [mixed with stimulan beeds]   gentamicin (GARAMYCIN) injection 80 mg    11/18/23 1558 Given  [mixed with cement]   vancomycin (VANCOCIN) powder 1,000 mg    11/18/23 1600 Given  [mixed with cement]   tobramycin (NEBCIN) powder 1.2 g    11/18/23 1602 Given  [mixed with cement]   gentamicin (GARAMYCIN) injection 80 mg    11/18/23 2110 Given   hydroxychloroquine (PLAQUENIL) tablet 200 mg 200 mg    11/18/23 2134 New Bag/Given   cefTRIAXone (ROCEPHIN) 2 g in sodium chloride 0.9 % 100 mL IVPB 2 g 200 mL/hr   11/18/23 2329 New Bag/Given   DAPTOmycin (CUBICIN) IVPB  500 mg/90mL premix 500 mg 100 mL/hr   11/19/23 1001 Given   hydroxychloroquine (PLAQUENIL) tablet 200 mg 200 mg    11/19/23 2201 Given   hydroxychloroquine (PLAQUENIL) tablet 200 mg 200 mg    11/19/23 2256 New Bag/Given   cefTRIAXone (ROCEPHIN) 2 g in sodium chloride 0.9 % 100 mL IVPB 2 g 200 mL/hr          Objective   Blood pressure (!) 115/59, pulse 68, temperature 97.9 F (36.6 C), temperature source Axillary, resp. rate 18, weight 84.7 kg, SpO2 96%.    Vent Mode: PRVC FiO2 (%):  [50 %-100  %] 50 % Set Rate:  [18 bmp-188 bmp] 18 bmp Vt Set:  [450 mL-500 mL] 500 mL PEEP:  [5 cmH20] 5 cmH20 Plateau Pressure:  [20 cmH20] 20 cmH20   Intake/Output Summary (Last 24 hours) at 11/20/2023 0715 Last data filed at 11/20/2023 1191 Gross per 24 hour  Intake 1166.08 ml  Output 1358 ml  Net -191.92 ml   Filed Weights   11/18/23 1140 11/20/23 0459  Weight: 77.1 kg 84.7 kg     REVIEW OF SYSTEMS  PATIENT IS UNABLE TO PROVIDE COMPLETE REVIEW OF SYSTEMS DUE TO SEVERE CRITICAL ILLNESS   PHYSICAL EXAMINATION:  GENERAL:critically ill appearing, +resp distress EYES: Pupils equal, round, reactive to light.  No scleral icterus.  MOUTH: Moist mucosal membrane. INTUBATED NECK: Supple.  PULMONARY: Lungs clear to auscultation, +rhonchi, +wheezing CARDIOVASCULAR: S1 and S2.  Regular rate and rhythm GASTROINTESTINAL: Soft, nontender, -distended. Positive bowel sounds.  MUSCULOSKELETAL: RT arm honeycomb dessing, wound vac inserted in to upper arm, splint in place NEUROLOGIC: obtunded,sedated SKIN:normal, warm to touch, Capillary refill delayed  Pulses present bilaterally    Labs/imaging that I havepersonally reviewed  (right click and "Reselect all SmartList Selections" daily)     ASSESSMENT AND PLAN SYNOPSIS  72 yo white female with Severe ACUTE Hypoxic and Hypercapnic Respiratory Failure and acute cardiac arrest with underlying septic arthritis and infected joint with acute on chronic renal failure,  assess for MI, PE, acute CVA work up pending  Severe ACUTE Hypoxic and Hypercapnic Respiratory Failure -continue Mechanical Ventilator support -Wean Fio2 and PEEP as tolerated -VAP/VENT bundle implementation - Wean PEEP & FiO2 as tolerated, maintain SpO2 > 88% - Head of bed elevated 30 degrees, VAP protocol in place - Plateau pressures less than 30 cm H20  - Intermittent chest x-ray & ABG PRN - Ensure adequate pulmonary hygiene  -will perform SAT/SBT when respiratory parameters are  met  Vent Mode: PRVC FiO2 (%):  [50 %-100 %] 50 % Set Rate:  [18 bmp-188 bmp] 18 bmp Vt Set:  [450 mL-500 mL] 500 mL PEEP:  [5 cmH20] 5 cmH20 Plateau Pressure:  [20 cmH20] 20 cmH20    ACUTE RT SIDED TENSION PTX S/p  chest tube 12/7 Continue to suction  Acute CARDIAC ARREST SEPSIS, COMPLICATED BY ACUTE RT SIDED PTX Likely acute NSTEMI Check ECHO   CARDIAC ICU monitoring  ACUTE KIDNEY INJURY/Renal Failure -continue Foley Catheter-assess need -Avoid nephrotoxic agents -Follow urine output, BMP -Ensure adequate renal perfusion, optimize oxygenation -Renal dose medications   Intake/Output Summary (Last 24 hours) at 11/20/2023 0719 Last data filed at 11/20/2023 4782 Gross per 24 hour  Intake 1166.08 ml  Output 1358 ml  Net -191.92 ml    NEUROLOGY ACUTE METABOLIC ENCEPHALOPATHY -need for sedation -Goal RASS -2 to -3   SEPTIC /CARDIOGENIC SHOCK SOURCE-infected shoulder, acute cardiac arrest -use vasopressors to keep MAP>65 as needed -follow ABG and  LA as needed -follow up cultures -emperic ABX  INFECTIOUS DISEASE -continue antibiotics as prescribed -follow up cultures   ENDO - ICU hypoglycemic\Hyperglycemia protocol -check FSBS per protocol   GI GI PROPHYLAXIS as indicated  NUTRITIONAL STATUS DIET-->TF's as tolerated Constipation protocol as indicated   ELECTROLYTES -follow labs as needed -replace as needed -pharmacy consultation and following    ACUTE ANEMIA- TRANSFUSE AS NEEDED CONSIDER TRANSFUSION  IF HGB<7    WOUND RT SHOULDER JOINT INFECTION   Best practice (right click and "Reselect all SmartList Selections" daily)  Diet: NPO Pain/Anxiety/Delirium protocol (if indicated): Yes (RASS goal -1) VAP protocol (if indicated): Yes DVT prophylaxis: LMWH GI prophylaxis: H2B Foley:  Yes, and it is still needed Mobility:  bed rest  Code Status:  FULL Disposition:ICU  Labs   CBC: Recent Labs  Lab 11/18/23 1928 11/19/23 0216  11/19/23 1310 11/19/23 2039 11/20/23 0252  WBC 8.2 8.6 14.8* 27.5* 22.6*  NEUTROABS  --  7.1 9.3*  --   --   HGB 7.0* 7.1* 8.7* 8.0* 7.6*  HCT 22.3* 23.4* 29.1* 25.8* 24.6*  MCV 92.1 92.9 96.7 92.1 91.1  PLT 294 304 334 327 275    Basic Metabolic Panel: Recent Labs  Lab 11/18/23 1748 11/19/23 0216 11/19/23 1310 11/19/23 2039 11/20/23 0252  NA 140 141 140 140 138  K 4.5 5.6* 5.2* 5.2* 5.1  CL 108 107 105 106 104  CO2 22 25 19* 24 24  GLUCOSE 120* 116* 162* 121* 103*  BUN 60* 60* 60* 71* 73*  CREATININE 2.50* 2.65* 2.95* 2.83* 2.80*  CALCIUM 8.2* 8.9 10.0 8.9 8.8*  MG  --   --   --  2.2 2.1  PHOS  --   --   --  5.8* 4.9*   GFR: Estimated Creatinine Clearance: 19.3 mL/min (A) (by C-G formula based on SCr of 2.8 mg/dL (H)). Recent Labs  Lab 11/19/23 0216 11/19/23 1310 11/19/23 1311 11/19/23 1455 11/19/23 2039 11/20/23 0252  WBC 8.6 14.8*  --   --  27.5* 22.6*  LATICACIDVEN  --   --  7.3* 2.2* 1.5  --      Past Medical History:  She,  has a past medical history of Acute hypoxemic respiratory failure (HCC), Anemia in stage 4 chronic kidney disease (HCC), Anginal pain (HCC), Anxiety, Aortic atherosclerosis (HCC), CHF (congestive heart failure) (HCC), Chronic pain, Chronic radicular pain of lower back, CKD (chronic kidney disease), stage IV (HCC), Complication of anesthesia, Coronary artery calcification seen on CT scan, Costochondritis, DDD (degenerative disc disease), lumbar, Depression, Dyspnea, GERD (gastroesophageal reflux disease), Hiatal hernia, Hip dysplasia, Hyperlipidemia, Hypertension, Insomnia, Iron deficiency, Iron deficiency anemia, Low back pain, Low vitamin B12 level, Lumbar facet joint pain, Migraines, Nose colonized with MRSA (10/03/2020), Numbness and tingling of right leg, OA (osteoarthritis), Obesity, OSA (obstructive sleep apnea), Osteoporosis, Pelvic fracture (HCC), Pneumonia, Pulmonary fibrosis (HCC), Raynaud's disease without gangrene, Restless leg  syndrome, Rheumatoid arthritis (HCC), Right renal mass (05/28/2023), Seasonal allergies, and Thoracic compression fracture (HCC).   Surgical History:   Past Surgical History:  Procedure Laterality Date   ABDOMINAL SURGERY     pt denies   APPENDECTOMY     CARPAL TUNNEL RELEASE Bilateral    CHOLECYSTECTOMY     COLONOSCOPY WITH PROPOFOL N/A 08/15/2020   Procedure: COLONOSCOPY WITH PROPOFOL;  Surgeon: Earline Mayotte, MD;  Location: [facility];  Service: Endoscopy;  Laterality: N/A;   CYSTOSCOPY W/ RETROGRADES Right 07/18/2023   Procedure: CYSTOSCOPY WITH RETROGRADE PYELOGRAM;  Surgeon: Apolinar Junes,  Morrie Sheldon, MD;  Location: [facility];  Service: Urology;  Laterality: Right;   DILATION AND CURETTAGE OF UTERUS     ESOPHAGOGASTRODUODENOSCOPY (EGD) WITH PROPOFOL N/A 08/15/2020   Procedure: ESOPHAGOGASTRODUODENOSCOPY (EGD) WITH PROPOFOL;  Surgeon: Earline Mayotte, MD;  Location: [facility];  Service: Endoscopy;  Laterality: N/A;   FRACTURE SURGERY     hip fracture    FRACTURE SURGERY     pelvic fracture plate    HIP SURGERY Left    KNEE ARTHROPLASTY Right 10/13/2020   Procedure: COMPUTER ASSISTED TOTAL KNEE ARTHROPLASTY - RNFA;  Surgeon: Donato Heinz, MD;  Location: [facility];  Service: Orthopedics;  Laterality: Right;   REVERSE SHOULDER ARTHROPLASTY Right 08/30/2023   Procedure: Right reverse shoulder arthroplasty, biceps tenodesis;  Surgeon: Signa Kell, MD;  Location: [facility];  Service: Orthopedics;  Laterality: Right;   TOTAL SHOULDER REVISION Right 10/03/2023   Procedure: Revision right reverse shoulder arthroplasty with conversion to long humeral stem and open reduction internal fixation of the humerus;  Surgeon: Signa Kell, MD;  Location: [facility];  Service: Orthopedics;  Laterality: Right;   TUBAL LIGATION     URETEROSCOPY  07/18/2023   Procedure: DIAGNOSTIC URETEROSCOPY;  Surgeon: [name], MD;  Location: [facility];  Service: Urology;;     Social History:   reports that she  has never smoked. She has never used smokeless tobacco. She reports that she does not drink alcohol and does not use drugs.   Family History:  Her family history includes Aneurysm in her father; Cancer in her brother; Diabetes in her brother, maternal grandfather, mother, and son; Heart disease in her maternal grandfather; Hypertension in her mother; Osteosarcoma in her brother; Seizures in her son.   Allergies Allergies  Allergen Reactions   Gabapentin Other (See Comments)    Weight gain   Ibuprofen Other (See Comments)    Headache     Home Medications  Prior to Admission medications   Medication Sig Start Date End Date Taking? Authorizing Provider  acetaminophen (TYLENOL) 500 MG tablet Take 2 tablets (1,000 mg total) by mouth every 6 (six) hours as needed. 08/31/23  Yes Anson Oregon, PA-C  aspirin EC 325 MG tablet Take 1 tablet (325 mg total) by mouth daily. 08/31/23  Yes Anson Oregon, PA-C  calcitRIOL (ROCALTROL) 0.25 MCG capsule Take 0.25 mcg by mouth every morning. 05/22/21  Yes [provider]  Calcium Carb-Cholecalciferol 600-400 MG-UNIT CAPS Take 1 capsule by mouth 3 (three) times daily. 01/13/10  Yes [provider]  chlorhexidine (HIBICLENS) 4 % external liquid Apply 15 mLs (1 Application total) topically as directed for 30 doses. Use as directed daily for 5 days every other week for 6 weeks. 11/18/23  Yes Signa Kell, MD  Cholecalciferol 25 MCG (1000 UT) tablet Take 1,000 Units by mouth every morning. 09/23/09  Yes [provider]  cyanocobalamin 1000 MCG tablet Take 1,000 mcg by mouth every morning.   Yes [provider]  cyclobenzaprine (FLEXERIL) 10 MG tablet Take 10 mg by mouth at bedtime as needed for muscle spasms (Leg cramps).   Yes [provider]  enalapril-hydrochlorothiazide (VASERETIC) 10-25 MG tablet Take 1 tablet by mouth at bedtime. 09/12/23  Yes Hammock, Lavonna Rua, NP  ferrous sulfate 325 (65 FE) MG tablet Take 325  mg by mouth daily with breakfast.   Yes [provider]  furosemide (LASIX) 40 MG tablet Take 1.5 tablets (60 mg total) by mouth every morning. Increase to 1.5 tablet (60 mg total)  by mouth in morning and extra 1 tablet (40 mg total for maximum daily dose 100 mg) at lunch time as needed for up to 3 days for increased leg swelling, shortness of breath, weight gain 5+ lbs over 1-2 days. Seek medical care if these symptoms are not improving with increased dose. 09/02/23  Yes Sunnie Nielsen, DO  hydroxychloroquine (PLAQUENIL) 200 MG tablet Take 200 mg by mouth 2 (two) times daily. 06/22/21  Yes [provider]  mirtazapine (REMERON) 30 MG tablet Take 30 mg by mouth at bedtime. 06/29/22  Yes [provider]  Multiple Vitamins-Minerals (MULTIVITAMIN ADULT PO) Take 1 tablet by mouth every morning. 09/10/08  Yes [provider]  mupirocin ointment (BACTROBAN) 2 % Apply small about inside of both nostrils TWICE a day for the next 5 days. 08/25/23  Yes Verlee Monte, NP  mupirocin ointment (BACTROBAN) 2 % Place 1 Application into the nose 2 (two) times daily for 60 doses. Use as directed 2 times daily for 5 days every other week for 6 weeks. 11/18/23 12/18/23 Yes Signa Kell, MD  omeprazole (PRILOSEC) 40 MG capsule Take 40 mg by mouth every morning.   Yes [provider]  ondansetron (ZOFRAN) 4 MG tablet Take 1 tablet (4 mg total) by mouth every 6 (six) hours as needed for nausea. 08/31/23  Yes Anson Oregon, PA-C  oxyCODONE (OXY IR/ROXICODONE) 5 MG immediate release tablet Take 1 tablet (5 mg total) by mouth every 4 (four) hours as needed for moderate pain (pain score 4-6) (pain score 4-6). 10/04/23  Yes Dedra Skeens, PA-C  Pirfenidone 267 MG TABS Take 801 tablets by mouth 3 (three) times daily. 12/24/22  Yes [provider]  pramipexole (MIRAPEX) 0.125 MG tablet Take 0.125-0.25 mg by mouth See admin instructions. Take 0.125 mg in the morning and 0.25 mg  at  bedtime 02/15/18  Yes [provider]  PROLIA 60 MG/ML SOSY injection Inject 60 mg into the skin every 6 (six) months. 04/06/23  Yes [provider]  rosuvastatin (CRESTOR) 5 MG tablet Take 5 mg by mouth every other day. 07/18/19  Yes [provider]  spironolactone (ALDACTONE) 25 MG tablet Take 25 mg by mouth at bedtime. 04/09/21  Yes [provider]  tetrahydrozoline 0.05 % ophthalmic solution Place 1 drop into both eyes daily as needed (allergies).   Yes [provider]  venlafaxine XR (EFFEXOR-XR) 150 MG 24 hr capsule Take 150 mg by mouth See admin instructions. Take with 75 mg for total 225 mg in the morning 12/28/22 12/28/23 Yes [provider]  venlafaxine XR (EFFEXOR-XR) 75 MG 24 hr capsule Take 75 mg by mouth See admin instructions. Take with 150 mg for a total of 225 mg in the morning 08/24/23  Yes [provider]  chlorhexidine (HIBICLENS) 4 % external liquid Apply 15 mLs (1 Application total) topically as directed for 30 doses. Use as directed daily for 5 days every other week for 6 weeks. Patient not taking: Reported on 11/17/2023 10/03/23   Signa Kell, MD      PATIENT WITH VERY POOR PROGNOSIS I ANTICIPATE PROLONGED ICU LOS    DVT/GI PRX  assessed I Assessed the need for Labs I Assessed the need for Foley I Assessed the need for Central Venous Line Family Discussion when available I Assessed the need for Mobilization I made an Assessment of medications to be adjusted accordingly Safety Risk assessment completed  CASE DISCUSSED IN MULTIDISCIPLINARY ROUNDS WITH ICU TEAM  Critical Care Time devoted to patient care services described in this note is 55 minutes.  Critical care was necessary to treat /prevent imminent and life-threatening deterioration. Overall, patient is critically ill, prognosis is guarded.  Patient with Multiorgan failure and at high risk for cardiac arrest and death.    Lucie Leather, M.D.   [facility]  Medical Director [facility] Medical Director [facility] Cardio-Pulmonary Department

## 2023-11-21 ENCOUNTER — Other Ambulatory Visit: Payer: Self-pay

## 2023-11-21 ENCOUNTER — Inpatient Hospital Stay: Payer: Medicare HMO

## 2023-11-21 DIAGNOSIS — I4891 Unspecified atrial fibrillation: Secondary | ICD-10-CM

## 2023-11-21 DIAGNOSIS — N179 Acute kidney failure, unspecified: Secondary | ICD-10-CM

## 2023-11-21 DIAGNOSIS — J9601 Acute respiratory failure with hypoxia: Secondary | ICD-10-CM | POA: Diagnosis not present

## 2023-11-21 DIAGNOSIS — I469 Cardiac arrest, cause unspecified: Secondary | ICD-10-CM

## 2023-11-21 DIAGNOSIS — R579 Shock, unspecified: Secondary | ICD-10-CM | POA: Diagnosis not present

## 2023-11-21 DIAGNOSIS — T8459XA Infection and inflammatory reaction due to other internal joint prosthesis, initial encounter: Secondary | ICD-10-CM

## 2023-11-21 LAB — BLOOD GAS, ARTERIAL
Acid-base deficit: 1.7 mmol/L (ref 0.0–2.0)
Acid-base deficit: 2.6 mmol/L — ABNORMAL HIGH (ref 0.0–2.0)
Bicarbonate: 23 mmol/L (ref 20.0–28.0)
Bicarbonate: 23.7 mmol/L (ref 20.0–28.0)
FIO2: 30 %
FIO2: 0.7 %
MECHVT: 500 mL
MECHVT: 350 mL
Mechanical Rate: 18
O2 Saturation: 91.4 %
O2 Saturation: 98.5 %
PEEP: 10 cmH2O
Patient temperature: 37
Patient temperature: 37
RATE: 18 {breaths}/min
pCO2 arterial: 38 mm[Hg] (ref 32–48)
pCO2 arterial: 46 mm[Hg] (ref 32–48)
pH, Arterial: 7.39 (ref 7.35–7.45)
pH, Arterial: 7.32 — ABNORMAL LOW (ref 7.35–7.45)
pO2, Arterial: 59 mm[Hg] — ABNORMAL LOW (ref 83–108)
pO2, Arterial: 209 mm[Hg] — ABNORMAL HIGH (ref 83–108)

## 2023-11-21 LAB — COMPREHENSIVE METABOLIC PANEL
ALT: 20 U/L (ref 0–44)
AST: 51 U/L — ABNORMAL HIGH (ref 15–41)
Albumin: 2.4 g/dL — ABNORMAL LOW (ref 3.5–5.0)
Alkaline Phosphatase: 120 U/L (ref 38–126)
Anion gap: 8 (ref 5–15)
BUN: 67 mg/dL — ABNORMAL HIGH (ref 8–23)
CO2: 25 mmol/L (ref 22–32)
Calcium: 8.6 mg/dL — ABNORMAL LOW (ref 8.9–10.3)
Chloride: 104 mmol/L (ref 98–111)
Creatinine, Ser: 2.66 mg/dL — ABNORMAL HIGH (ref 0.44–1.00)
GFR, Estimated: 19 mL/min — ABNORMAL LOW (ref >60)
Glucose, Bld: 107 mg/dL — ABNORMAL HIGH (ref 70–99)
Potassium: 3.7 mmol/L (ref 3.5–5.1)
Sodium: 137 mmol/L (ref 135–145)
Total Bilirubin: 0.8 mg/dL (ref <1.2)
Total Protein: 6.1 g/dL — ABNORMAL LOW (ref 6.5–8.1)

## 2023-11-21 LAB — CBC
HCT: 23.8 % — ABNORMAL LOW (ref 36.0–46.0)
Hemoglobin: 7.6 g/dL — ABNORMAL LOW (ref 12.0–15.0)
MCH: 28.6 pg (ref 26.0–34.0)
MCHC: 31.9 g/dL (ref 30.0–36.0)
MCV: 89.5 fL (ref 80.0–100.0)
Platelets: 251 10*3/uL (ref 150–400)
RBC: 2.66 MIL/uL — ABNORMAL LOW (ref 3.87–5.11)
RDW: 16.5 % — ABNORMAL HIGH (ref 11.5–15.5)
WBC: 15.3 10*3/uL — ABNORMAL HIGH (ref 4.0–10.5)
nRBC: 0 % (ref 0.0–0.2)

## 2023-11-21 LAB — PHOSPHORUS: Phosphorus: 4 mg/dL (ref 2.5–4.6)

## 2023-11-21 LAB — GLUCOSE, CAPILLARY: Glucose-Capillary: 84 mg/dL (ref 70–99)

## 2023-11-21 LAB — MAGNESIUM: Magnesium: 2 mg/dL (ref 1.7–2.4)

## 2023-11-21 MED ORDER — PROPOFOL 1000 MG/100ML IV EMUL
0.0000 ug/kg/min | INTRAVENOUS | Status: DC
  Administered 2023-11-21: 25 ug/kg/min via INTRAVENOUS
  Administered 2023-11-21: 5 ug/kg/min via INTRAVENOUS
  Administered 2023-11-22: 25 ug/kg/min via INTRAVENOUS
  Filled 2023-11-21 (×3): qty 100

## 2023-11-21 MED ORDER — OXYCODONE HCL 5 MG PO TABS: 5.0000 mg | ORAL_TABLET | Freq: Four times a day (QID) | ORAL | Status: DC

## 2023-11-21 MED ORDER — ACETAMINOPHEN 650 MG RE SUPP: 650.0000 mg | Freq: Four times a day (QID) | RECTAL | Status: DC | PRN

## 2023-11-21 MED ORDER — ACETAMINOPHEN 325 MG PO TABS
650.0000 mg | ORAL_TABLET | Freq: Four times a day (QID) | ORAL | Status: DC
  Administered 2023-11-21 – 2023-11-24 (×13): 650 mg
  Filled 2023-11-21 (×13): qty 2

## 2023-11-21 MED ORDER — LIDOCAINE 5 % EX PTCH
1.0000 | MEDICATED_PATCH | CUTANEOUS | Status: DC
  Administered 2023-11-21 – 2023-12-02 (×12): 1 via TRANSDERMAL
  Filled 2023-11-21 (×13): qty 1

## 2023-11-21 MED ORDER — DAPTOMYCIN-SODIUM CHLORIDE 700-0.9 MG/100ML-% IV SOLN
700.0000 mg | INTRAVENOUS | Status: DC
  Administered 2023-11-22 – 2023-11-30 (×5): 700 mg via INTRAVENOUS
  Filled 2023-11-21 (×6): qty 100

## 2023-11-21 MED ORDER — VITAL AF 1.2 CAL PO LIQD
1000.0000 mL | ORAL | Status: DC
Start: 2023-11-21 — End: 2023-11-24
  Administered 2023-11-21 – 2023-11-22 (×2): 1000 mL

## 2023-11-21 MED ORDER — FREE WATER
30.0000 mL | Status: DC
Start: 2023-11-21 — End: 2023-11-24
  Administered 2023-11-21 – 2023-11-24 (×14): 30 mL

## 2023-11-21 MED ORDER — HEPARIN SODIUM (PORCINE) 5000 UNIT/ML IJ SOLN
5000.0000 [IU] | Freq: Three times a day (TID) | INTRAMUSCULAR | Status: DC
  Administered 2023-11-21 – 2023-11-22 (×3): 5000 [IU] via SUBCUTANEOUS
  Filled 2023-11-21 (×3): qty 1

## 2023-11-21 MED ORDER — OXYCODONE HCL 5 MG PO TABS
10.0000 mg | ORAL_TABLET | Freq: Four times a day (QID) | ORAL | Status: DC
  Administered 2023-11-21 – 2023-11-24 (×13): 10 mg
  Filled 2023-11-21 (×13): qty 2

## 2023-11-21 MED ORDER — FUROSEMIDE 10 MG/ML IJ SOLN
40.0000 mg | Freq: Once | INTRAMUSCULAR | Status: AC
  Administered 2023-11-21: 40 mg via INTRAVENOUS
  Filled 2023-11-21: qty 4

## 2023-11-21 MED ORDER — PROSOURCE TF20 ENFIT COMPATIBL EN LIQD
60.0000 mL | Freq: Every day | ENTERAL | Status: DC
  Administered 2023-11-22 – 2023-11-24 (×3): 60 mL

## 2023-11-21 MED ORDER — POTASSIUM CHLORIDE 20 MEQ PO PACK
40.0000 meq | PACK | Freq: Once | ORAL | Status: AC
  Administered 2023-11-21: 40 meq
  Filled 2023-11-21: qty 2

## 2023-11-21 NOTE — Consult Note (Addendum)
NAME: Tonya Cummings  DOB: 03-29-1951  MRN: [id number]  Date/Time: 11/21/2023 9:21 AM  REQUESTING PROVIDER: Dr.Patel Subjective:  REASON FOR CONSULT: Rt shoulder PJI ? Tonya Cummings is a 72 y.o. with a history of CKD, HFpEF, HTN, IDA, rt renal mass, Rheumatoid arthritis, who had undergone on 9/17 Rt reverese shoulder arthroplasty and then on 10/21 revision  with conversion to long stem humeral component for periprosthtic fracture was admitted on 12/6 for elective Right reverse shoulder arthroplasty explant of glenoid and humeral components for infection 2. Open treatment of right humerus fracture with intramedullary device 3. Right humerus insertion of antibiotic deliver device  She was started on dapto and ceftriaxone  On 12/7 she had acute episodes of SOB X 2 and had cardiac arrest, resuscitated   intubated and now in ICU Developed tension pneumothorax and has rt chest drain I am seeing for the rt shoulder infection Past Medical History:  Diagnosis Date   Acute hypoxemic respiratory failure (HCC)    Anemia in stage 4 chronic kidney disease (HCC)    Anginal pain (HCC)    Anxiety    Aortic atherosclerosis (HCC)    CHF (congestive heart failure) (HCC)    a.) TTE 09/30/2021: EF 55-60%, no RWMAs, mild MR, G1DD   Chronic pain    Chronic radicular pain of lower back    CKD (chronic kidney disease), stage IV (HCC)    Complication of anesthesia    a.) delayed emergence following ureteroscopy 07/2023   Coronary artery calcification seen on CT scan    Costochondritis    DDD (degenerative disc disease), lumbar    Depression    Dyspnea    GERD (gastroesophageal reflux disease)    Hiatal hernia    Hip dysplasia    Hyperlipidemia    Hypertension    Insomnia    Iron deficiency    Iron deficiency anemia    Low back pain    Low vitamin B12 level    Lumbar facet joint pain    Migraines    Nose colonized with MRSA 10/03/2020   a.) preop PCR (+) 10/03/2020 prior to RIGHT TKA; b.)  preop PCR (+) 08/25/2023 prior to RIGHT REVERSE SHOULDER ARTHROPLASTY; BICEPS TENODESIS   Numbness and tingling of right leg    OA (osteoarthritis)    Obesity    OSA (obstructive sleep apnea)    a.) not currently utilizing nocturnal PAP therapy; positional. PCCM feels as if symptom can be mitigated with diet/exercise/weight loss unless develops significant symptoms.   Osteoporosis    a.) recieves denosumab injections   Pelvic fracture (HCC)    Pneumonia    Pulmonary fibrosis (HCC)    Raynaud's disease without gangrene    Restless leg syndrome    a.) on pramipexole + oral Fe supplementation   Rheumatoid arthritis (HCC)    a.) Tx'd with hydroxychloroquine   Right renal mass 05/28/2023   a.) CT renal 05/28/2023:  5.7 cm mass in the medial aspect of right kidney   Seasonal allergies    Thoracic compression fracture [facility])     Past Surgical History:  Procedure Laterality Date   ABDOMINAL SURGERY     pt denies   APPENDECTOMY     CARPAL TUNNEL RELEASE Bilateral    CHOLECYSTECTOMY     COLONOSCOPY WITH PROPOFOL N/A 08/15/2020   Procedure: COLONOSCOPY WITH PROPOFOL;  Surgeon: Earline Mayotte, MD;  Location: [facility];  Service: Endoscopy;  Laterality: N/A;   CYSTOSCOPY W/ RETROGRADES Right 07/18/2023  Procedure: CYSTOSCOPY WITH RETROGRADE PYELOGRAM;  Surgeon: [name], MD;  Location: [facility] OR[facility];  Service: Urology;  Laterality: Right;   DILATION AND CURETTAGE OF UTERUS     ESOPHAGOGASTRODUODENOSCOPY (EGD) WITH PROPOFOL N/A 08/15/2020   Procedure: ESOPHAGOGASTRODUODENOSCOPY (EGD) WITH PROPOFOL;  Surgeon: Earline Mayotte, MD;  Location: [facility];  Service: Endoscopy;  Laterality: N/A;   FRACTURE SURGERY     hip fracture    FRACTURE SURGERY     pelvic fracture plate    HIP SURGERY Left    KNEE ARTHROPLASTY Right 10/13/2020   Procedure: COMPUTER ASSISTED TOTAL KNEE ARTHROPLASTY - RNFA;  Surgeon: Donato Heinz, MD;  Location: [facility] OR[facility];  Service: Orthopedics;  Laterality:  Right;   REVERSE SHOULDER ARTHROPLASTY Right 08/30/2023   Procedure: Right reverse shoulder arthroplasty, biceps tenodesis;  Surgeon: Signa Kell, MD;  Location: [facility];  Service: Orthopedics;  Laterality: Right;   TOTAL SHOULDER REVISION Right 10/03/2023   Procedure: Revision right reverse shoulder arthroplasty with conversion to long humeral stem and open reduction internal fixation of the humerus;  Surgeon: Signa Kell, MD;  Location: [facility];  Service: Orthopedics;  Laterality: Right;   TUBAL LIGATION     URETEROSCOPY  07/18/2023   Procedure: DIAGNOSTIC URETEROSCOPY;  Surgeon: [name], MD;  Location: [facility];  Service: Urology;;    Social History   Socioeconomic History   Marital status: Married    Spouse name: Tonya Cummings  [phone]   Number of children: Not on file   Years of education: Not on file   Highest education level: Not on file  Occupational History   Not on file  Tobacco Use   Smoking status: Never   Smokeless tobacco: Never  Vaping Use   Vaping status: Never Used  Substance and Sexual Activity   Alcohol use: No   Drug use: No   Sexual activity: Yes  Other Topics Concern   Not on file  Social History Narrative   Not on file   Social Determinants of Health   Financial Resource Strain: Not on file  Food Insecurity: Patient Unable To Answer (11/20/2023)   Hunger Vital Sign    Worried About Running Out of Food in the Last Year: Patient unable to answer    Ran Out of Food in the Last Year: Patient unable to answer  Transportation Needs: Patient Unable To Answer (11/20/2023)   PRAPARE - Transportation    Lack of Transportation (Medical): Patient unable to answer    Lack of Transportation (Non-Medical): Patient unable to answer  Physical Activity: Not on file  Stress: Not on file  Social Connections: Not on file  Intimate Partner Violence: Patient Unable To Answer (11/20/2023)   Humiliation, Afraid, Rape, and Kick questionnaire    Fear of Current or  Ex-Partner: Patient unable to answer    Emotionally Abused: Patient unable to answer    Physically Abused: Patient unable to answer    Sexually Abused: Patient unable to answer    Family History  Problem Relation Age of Onset   Diabetes Mother    Hypertension Mother    Aneurysm Father    Diabetes Son    Seizures Son    Osteosarcoma Brother    Cancer Brother    Diabetes Brother    Diabetes Maternal Grandfather    Heart disease Maternal Grandfather    Allergies  Allergen Reactions   Gabapentin Other (See Comments)    Weight gain   Ibuprofen Other (See Comments)    Headache  I? Current Facility-Administered Medications  Medication Dose Route Frequency Provider Last Rate Last Admin   acetaminophen (TYLENOL) tablet 650 mg  650 mg Oral Q6H PRN Verdene Lennert, MD       Or   acetaminophen (TYLENOL) suppository 650 mg  650 mg Rectal Q6H PRN Verdene Lennert, MD       amiodarone (NEXTERONE PREMIX) 360-4.14 MG/200ML-% (1.8 mg/mL) IV infusion  30 mg/hr Intravenous Continuous Erin Fulling, MD 16.67 mL/hr at 11/21/23 0502 30 mg/hr at 11/21/23 0502   aspirin tablet 325 mg  325 mg Per Tube Daily Lowella Bandy, RPH   325 mg at 11/21/23 1191   bisacodyl (DULCOLAX) suppository 10 mg  10 mg Rectal Daily PRN Signa Kell, MD       calcitRIOL (ROCALTROL) capsule 0.25 mcg  0.25 mcg Per Tube q morning Lowella Bandy, RPH       calcium-vitamin D (OSCAL WITH D) 500-5 MG-MCG per tablet 1 tablet  1 tablet Per Tube TID Erin Fulling, MD   1 tablet at 11/20/23 2139   cefTRIAXone (ROCEPHIN) 2 g in sodium chloride 0.9 % 100 mL IVPB  2 g Intravenous Q24H Verdene Lennert, MD   Stopped at 11/20/23 2223   Chlorhexidine Gluconate Cloth 2 % PADS 6 each  6 each Topical Daily Jonah Blue, MD   6 each at 11/20/23 1115   cholecalciferol (VITAMIN D3) 25 MCG (1000 UNIT) tablet 1,000 Units  1,000 Units Per Tube q morning Lowella Bandy, RPH       cyanocobalamin (VITAMIN B12) tablet 1,000 mcg  1,000 mcg Per Tube q  morning Lowella Bandy, RPH   1,000 mcg at 11/21/23 0915   DAPTOmycin (CUBICIN) IVPB 500 mg/75mL premix  6 mg/kg Intravenous Q48H Bari Mantis A, RPH   Stopped at 11/20/23 1455   dexmedetomidine (PRECEDEX) 400 MCG/100ML (4 mcg/mL) infusion  0-1.2 mcg/kg/hr Intravenous Titrated Dahlia Byes, NP 13.49 mL/hr at 11/21/23 0502 0.7 mcg/kg/hr at 11/21/23 0502   docusate (COLACE) 50 MG/5ML liquid 100 mg  100 mg Per Tube BID Erin Fulling, MD   100 mg at 11/21/23 0914   enoxaparin (LOVENOX) injection 30 mg  30 mg Subcutaneous Q24H Verdene Lennert, MD   30 mg at 11/20/23 1115   famotidine (PEPCID) IVPB 20 mg premix  20 mg Intravenous Q24H Lowella Bandy, RPH       fentaNYL (SUBLIMAZE) bolus via infusion 25-100 mcg  25-100 mcg Intravenous Q15 min PRN Erin Fulling, MD   50 mcg at 11/21/23 0104   fentaNYL (SUBLIMAZE) injection 50 mcg  50 mcg Intravenous Q15 min PRN Erin Fulling, MD   50 mcg at 11/19/23 1309   fentaNYL in NS (48mcg/ml) infusion-PREMIX  25-200 mcg/hr Intravenous Continuous Erin Fulling, MD 10 mL/hr at 11/21/23 0502 100 mcg/hr at 11/21/23 0502   ferrous sulfate 300 (60 Fe) MG/5ML syrup 300 mg  300 mg Per Tube Daily Lowella Bandy, RPH       furosemide (LASIX) tablet 60 mg  60 mg Per Tube q morning Lowella Bandy, RPH   60 mg at 11/21/23 0915   midazolam (VERSED) injection 2 mg  2 mg Intravenous Q15 min PRN Erin Fulling, MD   2 mg at 11/20/23 4782   mupirocin ointment (BACTROBAN) 2 %   Topical BID Signa Kell, MD   Given at 11/20/23 2149   norepinephrine (LEVOPHED) 4mg  in (0.016 mg/mL) premix infusion  2-10 mcg/min Intravenous Titrated Erin Fulling, MD 11.25 mL/hr at 11/21/23 [phone]  3 mcg/min at 11/21/23 0531   polyethylene glycol (MIRALAX / GLYCOLAX) packet 17 g  17 g Oral Daily PRN Verdene Lennert, MD       polyethylene glycol (MIRALAX / GLYCOLAX) packet 17 g  17 g Per Tube Daily Erin Fulling, MD   17 g at 11/21/23 0916   pramipexole (MIRAPEX) tablet 0.125 mg  0.125 mg  Per Tube q morning Belia Heman, Kurian, MD   0.125 mg at 11/20/23 0900   And   pramipexole (MIRAPEX) tablet 0.25 mg  0.25 mg Per Tube QHS Erin Fulling, MD   0.25 mg at 11/20/23 2139   rosuvastatin (CRESTOR) tablet 5 mg  5 mg Per Tube Marcia Brash, RPH   5 mg at 11/21/23 0915   senna-docusate (Senokot-S) tablet 1 tablet  1 tablet Oral QHS PRN Signa Kell, MD       sodium chloride flush (NS) 0.9 % injection 3 mL  3 mL Intravenous Q12H Verdene Lennert, MD   3 mL at 11/20/23 2149   sodium chloride flush (NS) 0.9 % injection 3 mL  3 mL Intravenous Q12H Dahlia Byes, NP   3 mL at 11/20/23 2149     Abtx:  Anti-infectives (From admission, onward)    Start     Dose/Rate Route Frequency Ordered Stop   11/19/23 2245  hydroxychloroquine (PLAQUENIL) tablet 200 mg  Status:  Discontinued        200 mg Per Tube 2 times daily 11/19/23 2152 11/20/23 1041   11/19/23 1800  DAPTOmycin (CUBICIN) IVPB 500 mg/102mL premix  Status:  Discontinued        6 mg/kg  77.1 kg 100 mL/hr over 30 Minutes Intravenous Every 48 hours 11/18/23 1935 11/18/23 1943   11/18/23 2200  hydroxychloroquine (PLAQUENIL) tablet 200 mg  Status:  Discontinued        200 mg Oral 2 times daily 11/18/23 2003 11/19/23 2152   11/18/23 2100  ceFAZolin (ANCEF) IVPB 2g/100 mL premix  Status:  Discontinued        2 g 200 mL/hr over 30 Minutes Intravenous Every 6 hours 11/18/23 2003 11/18/23 2021   11/18/23 2000  DAPTOmycin (CUBICIN) IVPB 500 mg/47mL premix  Status:  Discontinued        6 mg/kg  77.1 kg 100 mL/hr over 30 Minutes Intravenous Every 48 hours 11/18/23 1906 11/18/23 1935   11/18/23 2000  DAPTOmycin (CUBICIN) IVPB 500 mg/75mL premix        6 mg/kg  77.1 kg 100 mL/hr over 30 Minutes Intravenous Every 48 hours 11/18/23 1943     11/18/23 1930  cefTRIAXone (ROCEPHIN) 2 g in sodium chloride 0.9 % 100 mL IVPB        2 g 200 mL/hr over 30 Minutes Intravenous Every 24 hours 11/18/23 1832     11/18/23 1602  gentamicin (GARAMYCIN)  injection  Status:  Discontinued          As needed 11/18/23 1632 11/18/23 1705   11/18/23 1600  tobramycin (NEBCIN) powder  Status:  Discontinued          As needed 11/18/23 1631 11/18/23 1705   11/18/23 1528  vancomycin (VANCOCIN) powder  Status:  Discontinued          As needed 11/18/23 1628 11/18/23 1705   11/18/23 0600  ceFAZolin (ANCEF) IVPB 2g/100 mL premix        2 g 200 mL/hr over 30 Minutes Intravenous On call to O.R. 11/17/23 2319 11/18/23 1527  REVIEW OF SYSTEMS:  NA Objective:  VITALS:  BP (!) 124/51   Pulse (!) 49   Temp 98.3 F (36.8 C) (Axillary)   Resp 16   Wt 78.6 kg   SpO2 93%   BMI 30.70 kg/m  LDA Intubated foley PHYSICAL EXAM:  General: intubated Head: Normocephalic, without obvious abnormality, atraumatic. Eyes: Conjunctivae clear, anicteric sclerae. Pupils are equal ENT cannot be examined Neck:  symmetrical, no adenopathy, thyroid: non tender no carotid bruit and no JVD. Lungs:decreased air entry rt side Chest drain. Heart: Regular rate and rhythm, no murmur, rub or gallop. Abdomen: Soft, non-tender,not distended. Bowel sounds normal. No masses Extremities: rt shoulder surgical site in sling/dressing Skin: some bruising over chest Lymph: Cervical, supraclavicular normal. Neurologic: cannot be assessed Pertinent Labs Lab Results CBC    Component Value Date/Time   WBC 15.3 (H) 11/21/2023 0402   RBC 2.66 (L) 11/21/2023 0402   HGB 7.6 (L) 11/21/2023 0402   HGB 7.5 (L) 11/01/2023 0918   HCT 23.8 (L) 11/21/2023 0402   PLT 251 11/21/2023 0402   PLT 334 11/01/2023 0918   MCV 89.5 11/21/2023 0402   MCH 28.6 11/21/2023 0402   MCHC 31.9 11/21/2023 0402   RDW 16.5 (H) 11/21/2023 0402   LYMPHSABS 4.1 (H) 11/19/2023 1310   MONOABS 0.6 11/19/2023 1310   EOSABS 0.0 11/19/2023 1310   BASOSABS 0.1 11/19/2023 1310       Latest Ref Rng & Units 11/21/2023    4:02 AM 11/20/2023   10:07 PM 11/20/2023    2:52 AM  CMP  Glucose 70 - 99 mg/dL 578   469  629   BUN 8 - 23 mg/dL 67  67  73   Creatinine 0.44 - 1.00 mg/dL 5.28  4.13  2.44   Sodium 135 - 145 mmol/L 137  137  138   Potassium 3.5 - 5.1 mmol/L 3.7  3.9  5.1   Chloride 98 - 111 mmol/L 104  104  104   CO2 22 - 32 mmol/L 25  23  24    Calcium 8.9 - 10.3 mg/dL 8.6  8.5  8.8   Total Protein 6.5 - 8.1 g/dL 6.1   6.3   Total Bilirubin <1.2 mg/dL 0.8   0.7   Alkaline Phos 38 - 126 U/L 120   115   AST 15 - 41 U/L 51   102   ALT 0 - 44 U/L 20   41       Microbiology: Recent Results (from the past 240 hour(s))  Surgical pcr screen     Status: Abnormal   Collection Time: 11/18/23 12:11 PM   Specimen: Nasal Mucosa; Nasal Swab  Result Value Ref Range Status   MRSA, PCR NEGATIVE NEGATIVE Final   Staphylococcus aureus POSITIVE (A) NEGATIVE Final    Comment: (NOTE) The Xpert SA Assay (FDA approved for NASAL specimens in patients 66 years of age and older), is one component of a comprehensive surveillance program. It is not intended to diagnose infection nor to guide or monitor treatment. Performed at [facility], [address] [place], [place] [address]   Aerobic/Anaerobic Culture w Gram Stain (surgical/deep wound)     Status: None (Preliminary result)   Collection Time: 11/18/23  2:29 PM   Specimen: Path Tissue  Result Value Ref Range Status   Specimen Description   Final    TISSUE Performed at [facility], [address] [place], [place] [address]    Special Requests RT HUMERUS  HOLD 21DAYS  Final   Gram Stain   Final    RARE WBC PRESENT,BOTH PMN AND MONONUCLEAR NO ORGANISMS SEEN    Culture   Final    NO GROWTH 2 DAYS NO ANAEROBES ISOLATED; CULTURE IN PROGRESS FOR 5 DAYS Performed at [facility], [address], [place], [place] [address]    Report Status PENDING  Incomplete  Aerobic/Anaerobic Culture w Gram Stain (surgical/deep wound)     Status: None (Preliminary result)   Collection Time: 11/18/23  2:43 PM   Specimen: Path Tissue   Result Value Ref Range Status   Specimen Description   Final    TISSUE Performed at [facility], [address] [place], [place] [address]    Special Requests TUBEROSITY,HOLD 21DAYS  Final   Gram Stain   Final    FEW WBC PRESENT,BOTH PMN AND MONONUCLEAR NO ORGANISMS SEEN    Culture   Final    NO GROWTH 2 DAYS NO ANAEROBES ISOLATED; CULTURE IN PROGRESS FOR 5 DAYS Performed at [facility], [address] [place], [place] [address]    Report Status PENDING  Incomplete  Aerobic/Anaerobic Culture w Gram Stain (surgical/deep wound)     Status: None (Preliminary result)   Collection Time: 11/18/23  2:52 PM   Specimen: Path Tissue  Result Value Ref Range Status   Specimen Description   Final    TISSUE Performed at [facility], [address] [place], [place] [address]    Special Requests RT HUMERAL TRAY HOLD 21 DAYS  Final   Gram Stain   Final    RARE WBC PRESENT, PREDOMINANTLY MONONUCLEAR NO ORGANISMS SEEN    Culture   Final    NO GROWTH 2 DAYS NO ANAEROBES ISOLATED; CULTURE IN PROGRESS FOR 5 DAYS Performed at [facility], [address] [place], [place] [address]    Report Status PENDING  Incomplete  Aerobic/Anaerobic Culture w Gram Stain (surgical/deep wound)     Status: None (Preliminary result)   Collection Time: 11/18/23  2:58 PM   Specimen: Path Tissue  Result Value Ref Range Status   Specimen Description   Final    TISSUE Performed at [facility], [address] [place], [place] [address]    Special Requests GLENOSPHERE,HOLD 21 DAYS  Final   Gram Stain   Final    RARE WBC PRESENT,BOTH PMN AND MONONUCLEAR NO ORGANISMS SEEN    Culture   Final    NO GROWTH 2 DAYS NO ANAEROBES ISOLATED; CULTURE IN PROGRESS FOR 5 DAYS Performed at [facility], [address], [place], [place] [address]    Report Status PENDING  Incomplete  Aerobic/Anaerobic Culture w Gram Stain (surgical/deep wound)     Status: None  (Preliminary result)   Collection Time: 11/18/23  3:06 PM   Specimen: Path Tissue  Result Value Ref Range Status   Specimen Description   Final    TISSUE Performed at [facility], [address] [place], [place] [address]    Special Requests RT HUMEREUS CANAL HOLD 21DAYS  Final   Gram Stain   Final    FEW WBC PRESENT, PREDOMINANTLY MONONUCLEAR NO ORGANISMS SEEN    Culture   Final    NO GROWTH 2 DAYS NO ANAEROBES ISOLATED; CULTURE IN PROGRESS FOR 5 DAYS Performed at [facility], [address] [place], [place] [address]    Report Status PENDING  Incomplete  Culture, blood (Routine X  2) w Reflex to ID Panel     Status: None (Preliminary result)   Collection Time: 11/18/23  7:22 PM   Specimen: BLOOD  Result Value Ref Range Status   Specimen Description BLOOD BLOOD LEFT HAND  Final   Special Requests   Final    BOTTLES DRAWN AEROBIC AND ANAEROBIC Blood Culture adequate volume   Culture   Final    NO GROWTH 3 DAYS Performed at [facility], [address] [place], [place] [address]    Report Status PENDING  Incomplete  Culture, blood (Routine X 2) w Reflex to ID Panel     Status: None (Preliminary result)   Collection Time: 11/18/23  7:28 PM   Specimen: BLOOD  Result Value Ref Range Status   Specimen Description BLOOD BLOOD LEFT ARM  Final   Special Requests   Final    BOTTLES DRAWN AEROBIC AND ANAEROBIC Blood Culture adequate volume   Culture   Final    NO GROWTH 3 DAYS Performed at [facility], [address], [place], [place] [address]    Report Status PENDING  Incomplete  MRSA Next Gen by PCR, Nasal     Status: None   Collection Time: 11/19/23  1:57 PM   Specimen: Nasal Mucosa; Nasal Swab  Result Value Ref Range Status   MRSA by PCR Next Gen NOT DETECTED NOT DETECTED Final    Comment: (NOTE) The GeneXpert MRSA Assay (FDA approved for NASAL specimens only), is one component of a comprehensive MRSA colonization  surveillance program. It is not intended to diagnose MRSA infection nor to guide or monitor treatment for MRSA infections. Test performance is not FDA approved in patients less than 50 years old. Performed at [facility], [address] [place], [place] [address]     IMAGING RESULTS: I have personally reviewed the films ? Impression/Recommendation Rt  shoulder reverse arthroplasty Prosthetic joint infection Explanation of hardware on 11/18/23 Multiple cultures sent Pt on daptomycin and ceftriaxone- continue- will de-esclatae depending on culture result  Cardiac arrest secondary to acute resp failure Need to r/o PE Pt intubated  Tension Pneumothorax post CPR Has rt chest drain  Iron def anemia  CKD  Rheumatoid arthritis  Rt renal mass .    ________________________________________________ Discussed with care team

## 2023-11-21 NOTE — Plan of Care (Signed)
  Problem: Clinical Measurements: Goal: Will remain free from infection Outcome: Progressing Goal: Diagnostic test results will improve Outcome: Progressing Goal: Respiratory complications will improve Outcome: Progressing Goal: Cardiovascular complication will be avoided Outcome: Progressing   Problem: Respiratory: Goal: Ability to maintain a clear airway and adequate ventilation will improve Outcome: Progressing

## 2023-11-21 NOTE — Progress Notes (Signed)
Attempt to flush OG tube, unable to flush after several attempts to clear. OG removed. New OG placed. Verified placement via xray.

## 2023-11-21 NOTE — Progress Notes (Addendum)
NAME:  Tonya Cummings, MRN:  [id number], DOB:  [birth year], LOS: 3 ADMISSION DATE:  11/18/2023  History of Present Illness:  This is a case of a 72 year old female patient with a past medical history of CKD stage IV, heart failure with preserved EF, hypertension, rheumatoid arthritis, OSA not on CPAP presents to [facility] on 12/06 for right reverse shoulder arthroplasty after for suspicion of septic joint.  She initially underwent right shoulder arthroplasty on 08/30/2023 after which she developed a.  Periprosthetic fracture status post conversion to a longstem humeral component with ORIF on 10/03/2023.  No evidence of infection at the time.  Follow-up lab work showed elevated inflammatory markers including ESR suspicious for an infectious process.  Therefore she presents back on 12/06 for shoulder arthroplasty explant of glenoid and humeral component open treatment of right humerus fracture with intramedullary device and insertion of antibiotic delivery system.  Small area of purulence was noted.  Cultures were sent.  Started on daptomycin and ceftriaxone.  She did well immediately postop however on 12/07 she was getting up to the bedside commode she went into respiratory distress with O2 sat dropping becoming apneic and went into PEA arrest.  CPR for roughly 5 to 6-minute prior to ROSC epi x 2.  Not shockable rhythm.  Patient intubated and transferred to the ICU for further care.  Postintubation chest x-ray with right pneumothorax status post chest tube placement 12/07.  HD catheter placed 12/07 anticipating need for dialysis given worsening kidney function.  Echocardiogram 12/08 with normal LVEF 55 to 60%.  RV systolic function is normal and size is normal.  Ultrasound venous lower extremity 12/08 negative for DVT.  Pertinent  Medical History  []  Rheumatoid arthritis on Plaquenil []  Hypertension on enalapril and hydrochlorothiazide []  Heart failure with preserved EF  with LVEF 55 to 60% and grade 1 diastolic dysfunction on furosemide  Significant Hospital Events: Including procedures, antibiotic start and stop dates in addition to other pertinent events   12/06 admitted for arthroplasty revision explant and insertion of antibiotic device. 12/7 PEA arrest intubated and admitted to the ICU.  Chest x-ray with right pneumothorax status post chest tube placement 12/07.  Status post HD catheter placement 12/07 with worsening kidney function. 12/08 remains intubated and sedated.   Interim History / Subjective:  This a.m. patient remained intubated sedated.  Able to open eyes to voice command.  Chest tube noted with serosanguineous drainage flushes easily.  No airleak. Placed on SBT howwever immediately desaturated. Likely pain driven.   Objective   Blood pressure (!) 124/51, pulse (!) 49, temperature 98.3 F (36.8 C), temperature source Axillary, resp. rate 16, weight 78.6 kg, SpO2 93%.    Vent Mode: PRVC FiO2 (%):  [30 %-40 %] 30 % Set Rate:  [18 bmp] 18 bmp Vt Set:  [500 mL] 500 mL PEEP:  [5 cmH20] 5 cmH20 Plateau Pressure:  [18 cmH20-24 cmH20] 24 cmH20   Intake/Output Summary (Last 24 hours) at 11/21/2023 0854 Last data filed at 11/21/2023 0600 Gross per 24 hour  Intake 2499.77 ml  Output 2145 ml  Net 354.77 ml   Filed Weights   11/18/23 1140 11/20/23 0459 11/21/23 0500  Weight: 77.1 kg 84.7 kg 78.6 kg    Examination: General: Intubated, sedated HENT: Supple neck, reactive pupils Lungs: Coarse breath sounds bilaterally.  Chest tube and right mid axillary noted. Cardiovascular: Normal S1, normal S2, regular rate and rhythm Abdomen: Soft nontender nondistended positive bowel sounds Extremities: Warm well-perfused no  edema.  Labs and imaging were reviewed  Assessment & Plan:  This is a case of a 72 year old female patient with a past medical history of CKD stage IV, heart failure with preserved EF, hypertension, rheumatoid arthritis, OSA not  on CPAP presents to [facility] on 12/06 for right reverse shoulder arthroplasty after for suspicion of septic joint. Course complicated by post op PEA arrest 12/07, c/b R pneumothorax s/p CT placement 12/07.   #Post op PEA arrest 12/07 unclear cause. Suspecting acute hypoxic respiratory failure prior to arrest and bradycardia. [place] LE (-), Echocardiogram w/ nl LVEF and normal RV. K (5.6). Minimal vent setting, low susp for PE. No sinus pauses on Tele. Vasovagal?  #Shock - likely distributive in the setting of sepsis and sedation.  #D3 Post R shoulder arthroplasty revision, suspicon for infected joint now on daptomycin and ceftriaxone. Tissue cultures negative to date.  #New onset A.fib in the setting of critical care illness. Started on Amio drip. Currently in sinus. # R sided pneumothorax thought to be due to CPR s/p CT placement 12/07.  #AKI on CKD with baseline Cr 2.4 mg/dl, likely secondary to pre renal now improved. S/p HD cath placement 12/07 in anticipation of HD needs.  #NSTEMI with Trop peak 800 likely demand ischemia. Echo as above. Heparin drip was not started given risk of bleeding.  #Anemia with baseline hgb 8.0 g/dl  Neuro: Precedex and midazolam prn for RASS 0 to -1. Fentanyl PRN for CPOT > 2. SAT daily. Start Standing Oxycodone 10mg  Q6H and lidocain patch for pain control.  [facility]: [place] for MAPs>65. Aspirin 325mg  PO daily (Home dose). Rosuvastatin 5mg  daily. D/c Amio drip.  Lungs: SBT this am. CT placed to waterseal. Repeat CXR in the pm.  ID: c/w daptomycin and ceftriaxone. Follow up Bx and Tissue cx. Negative to date. Appreciate ID recs.  GI: If not extubated today will start TF. Lax prn. Famotidine for GI prophylaxis.  Heme: Trend Hgb daily. Heparin 5000 [place] Q8H for DVT prophylaxis.  Renal: Trend Cr daily. Hold home Lasix. Can reassess need once hemodynamically stable.   Best Practice (right click and "Reselect all SmartList Selections" daily)   Diet/type:  NPO DVT prophylaxis prophylactic heparin  Pressure ulcer(s): N/A GI prophylaxis: H2B Lines: Dialysis Catheter Foley:  Yes, and it is still needed Code Status:  full code Last date of multidisciplinary goals of care discussion [11/21/2023]  I spent 60 minutes caring for this patient today, including preparing to see the patient, obtaining a medical history , reviewing a separately obtained history, performing a medically appropriate examination and/or evaluation, ordering medications, tests, or procedures, documenting clinical information in the electronic health record, and independently interpreting results (not separately reported/billed) and communicating results to the patient/family/caregiver  Janann Colonel, MD [facility] 11/21/2023 9:50 AM

## 2023-11-21 NOTE — Progress Notes (Signed)
Initial Nutrition Assessment  DOCUMENTATION CODES:   Not applicable  INTERVENTION:   If tube feeds initiated, recommend:  Vital 1.2@50ml /hr- Initiate at 1ml/hr and increased by 67ml/hr q 8 hours until goal rate is reached.   ProSource TF 20- Give 60ml daily via tube, each supplement provides 80kcal and 20g of protein.   Free water flushes 30ml q4 hours to maintain tube patency   Regimen provides 1520kcal/day, 110g/day protein and 1117ml/day of free water.   Pt at refeed risk; recommend monitor potassium, magnesium and phosphorus labs daily until stable  Daily weights  NUTRITION DIAGNOSIS:   Inadequate oral intake related to inability to eat (pt sedated and ventilated) as evidenced by NPO status.  GOAL:   Provide needs based on ASPEN/SCCM guidelines  MONITOR:   Vent status, Labs, Weight trends, TF tolerance, I & O's, Skin  REASON FOR ASSESSMENT:   Ventilator    ASSESSMENT:   72 y/o female with h/o hiatal hernia, GERD, RLS, OSA, IDA, depression, anxiety, HLD, HTN, PAF, CKD IV, CHF, ILD, DDD, RA, right total knee arthroplasty (2021) and renal cysts who is admitted with septic joint s/p right reverse shoulder arthroplasty 12/6 complicated by PEA arrest, AKI, NSTEMI and pneumothorax 12/7.  Pt sedated and ventilated. OGT in place. Plan is for possible extubation today. Will initiate tube feeds if pt does not extubate. Per chart, pt appears weight stable at baseline.   Medications reviewed and include: aspirin, calcitriol, oscal w/ D, D3, colace, ferrous sulfate, heparin, oxycodone, miralax, ceftriaxone, daptomycin, pepcid, levophed   Labs reviewed: K 3.7 wnl, BUN 67(H), creat 2.66(H), P 4.0 wnl, Mg 2.0 wnl Wbc- 15.3(H), Hgb  7.6(L), Hct 23.8(L)  Patient is currently intubated on ventilator support MV: 9.3 L/min Temp (24hrs), Avg:98.3 F (36.8 C), Min:97.9 F (36.6 C), Max:98.8 F (37.1 C)  Propofol: none   MAP- >   UOP-   Chest tube- 40ml    NUTRITION - FOCUSED PHYSICAL EXAM:  Flowsheet Row Most Recent Value  Orbital Region No depletion  Upper Arm Region No depletion  Thoracic and Lumbar Region No depletion  Buccal Region No depletion  Temple Region No depletion  Clavicle Bone Region No depletion  Clavicle and Acromion Bone Region No depletion  Scapular Bone Region No depletion  Dorsal Hand No depletion  Patellar Region No depletion  Anterior Thigh Region No depletion  Posterior Calf Region No depletion  Edema (RD Assessment) None  Hair Reviewed  Eyes Reviewed  Mouth Reviewed  Skin Reviewed  Nails Reviewed   Diet Order:   Diet Order             Diet NPO time specified  Diet effective now                  EDUCATION NEEDS:   No education needs have been identified at this time  Skin:  Skin Assessment: Reviewed RN Assessment (incision R shoulder)  Last BM:  12/5  Height:   Ht Readings from Last 1 Encounters:  11/21/23 5\' 3"  (1.6 m)    Weight:   Wt Readings from Last 1 Encounters:  11/21/23 78.6 kg    Ideal Body Weight:  52 kg  BMI:  Body mass index is 30.7 kg/m.  Estimated Nutritional Needs:   Kcal:  1466kcal/day  Protein:  100-120g/day  Fluid:  1.4-1.6L/day  Betsey Holiday MS, RD, LDN Please refer to [facility] for RD and/or RD on-call/weekend/after hours pager

## 2023-11-21 NOTE — Progress Notes (Signed)
PHARMACY CONSULT NOTE  Pharmacy Consult for Electrolyte Monitoring and Replacement   Recent Labs: Potassium (mmol/L)  Date Value  11/21/2023 3.7   Magnesium (mg/dL)  Date Value  16/09/9603 2.0   Calcium (mg/dL)  Date Value  54/08/8118 8.6 (L)   Albumin (g/dL)  Date Value  14/78/2956 2.4 (L)   Phosphorus (mg/dL)  Date Value  21/30/8657 4.0   Sodium (mmol/L)  Date Value  11/21/2023 137  09/12/2023 142   Assessment: 72 y.o. female with medical history significant of CKD Stage 4, HFpEF, HTN, B12 deficiency, anemia of CKD, rheumatoid arthritis, Raynaud's phenomenon, OSA not on CPAP, who was admitted on 12/6 for recurrent R shoulder revision following arthroplasty.   Diuretics: furosemide 60 mg per tube once daily  Goal of Therapy:  K >= 4, Mg >= 2  Plan:  --K 3.7, Kcl 40 mEq per tube x 1 dose --Labs tomorrow  Tressie Ellis 11/21/2023 9:25 AM

## 2023-11-21 NOTE — Progress Notes (Signed)
Pharmacy Antibiotic Note  Tonya Cummings is a 72 y.o. female admitted on 11/18/2023 with  septic arthritis  following shoulder arthroplasty.  Pharmacy has been consulted for Daptomycin dosing. PMH of Rheumatoid arthritis-on hydroxychloroquine (Prolia every 6 months)  Today, 11/21/2023 Day 3 daptomycin/ceftriaxone Renal: H/o CKD, SCr appears stable WBC 15.3 Afebrile OR 11/18/23  Periprosthetic infection with explant of glenoid and humeral components, Right humerus insertion of antibiotic deliver device 12/7 CK 291 (following OR) On rosuvastatin 5mg  every OTHER day 12/6 OR cultures: No growth to date - holding x 21 days due to increased risk for propionibacterium    Plan: Increase Daptomycin to 700mg  (8/8 mg/kg) V q48h for Crcl ~20 ml/min (next dose due 12/10 at 1400) Continue ceftriaxone 2gm IV q24h as ordered by physician  Follow CK at least weekly - plan to recheck 12/11 Follow cultures Follow renal function    Height: 5\' 3"  (160 cm) Weight: 78.6 kg (173 lb 4.5 oz) IBW/kg (Calculated) : 52.4  Temp (24hrs), Avg:98.5 F (36.9 C), Min:97.9 F (36.6 C), Max:99 F (37.2 C)  Recent Labs  Lab 11/19/23 0216 11/19/23 1310 11/19/23 1311 11/19/23 1455 11/19/23 2039 11/20/23 0252 11/20/23 2207 11/21/23 0402  WBC 8.6 14.8*  --   --  27.5* 22.6*  --  15.3*  CREATININE 2.65* 2.95*  --   --  2.83* 2.80* 2.79* 2.66*  LATICACIDVEN  --   --  7.3* 2.2* 1.5  --   --   --     Estimated Creatinine Clearance: 19.5 mL/min (A) (by C-G formula based on SCr of 2.66 mg/dL (H)).    Allergies  Allergen Reactions   Gabapentin Other (See Comments)    Weight gain   Ibuprofen Other (See Comments)    Headache    Antimicrobials this admission: Vanc 2 gm mixed w/ cement, gentamicin 80 mg mixed w/ cement, cefazolin 2gm x 1 Dapto 12/6 >>   Ceftriaxone 12/6 >>  Dose adjustments this admission:    Microbiology results: 12/6 BCx: pending   UCx:      Sputum:    12/6 MRSA PCR: positive 12/6  wound cx pending  Thank you for allowing pharmacy to be a part of this patient's care.  Juliette Alcide, PharmD, BCPS, BCIDP Work Cell: [phone] 11/21/2023 4:06 PM

## 2023-11-22 ENCOUNTER — Inpatient Hospital Stay: Payer: Medicare HMO

## 2023-11-22 ENCOUNTER — Other Ambulatory Visit: Payer: Self-pay

## 2023-11-22 DIAGNOSIS — N1832 Chronic kidney disease, stage 3b: Secondary | ICD-10-CM

## 2023-11-22 DIAGNOSIS — M9731XA Periprosthetic fracture around internal prosthetic right shoulder joint, initial encounter: Secondary | ICD-10-CM | POA: Diagnosis not present

## 2023-11-22 DIAGNOSIS — J189 Pneumonia, unspecified organism: Secondary | ICD-10-CM

## 2023-11-22 DIAGNOSIS — T8459XA Infection and inflammatory reaction due to other internal joint prosthesis, initial encounter: Secondary | ICD-10-CM | POA: Diagnosis not present

## 2023-11-22 DIAGNOSIS — I469 Cardiac arrest, cause unspecified: Secondary | ICD-10-CM | POA: Diagnosis not present

## 2023-11-22 DIAGNOSIS — M059 Rheumatoid arthritis with rheumatoid factor, unspecified: Secondary | ICD-10-CM | POA: Diagnosis not present

## 2023-11-22 DIAGNOSIS — J9601 Acute respiratory failure with hypoxia: Secondary | ICD-10-CM | POA: Diagnosis not present

## 2023-11-22 LAB — PROTIME-INR
INR: 1.3 — ABNORMAL HIGH (ref 0.8–1.2)
Prothrombin Time: 16.8 s — ABNORMAL HIGH (ref 11.4–15.2)

## 2023-11-22 LAB — CBC
HCT: 23.4 % — ABNORMAL LOW (ref 36.0–46.0)
Hemoglobin: 7.4 g/dL — ABNORMAL LOW (ref 12.0–15.0)
MCH: 29.4 pg (ref 26.0–34.0)
MCHC: 31.6 g/dL (ref 30.0–36.0)
MCV: 92.9 fL (ref 80.0–100.0)
Platelets: 230 10*3/uL (ref 150–400)
RBC: 2.52 MIL/uL — ABNORMAL LOW (ref 3.87–5.11)
RDW: 16.3 % — ABNORMAL HIGH (ref 11.5–15.5)
WBC: 14.6 10*3/uL — ABNORMAL HIGH (ref 4.0–10.5)
nRBC: 0 % (ref 0.0–0.2)

## 2023-11-22 LAB — COMPREHENSIVE METABOLIC PANEL
ALT: 16 U/L (ref 0–44)
AST: 36 U/L (ref 15–41)
Albumin: 2.1 g/dL — ABNORMAL LOW (ref 3.5–5.0)
Alkaline Phosphatase: 152 U/L — ABNORMAL HIGH (ref 38–126)
Anion gap: 15 (ref 5–15)
BUN: 72 mg/dL — ABNORMAL HIGH (ref 8–23)
CO2: 21 mmol/L — ABNORMAL LOW (ref 22–32)
Calcium: 8.7 mg/dL — ABNORMAL LOW (ref 8.9–10.3)
Chloride: 104 mmol/L (ref 98–111)
Creatinine, Ser: 2.96 mg/dL — ABNORMAL HIGH (ref 0.44–1.00)
GFR, Estimated: 16 mL/min — ABNORMAL LOW (ref >60)
Glucose, Bld: 96 mg/dL (ref 70–99)
Potassium: 3.9 mmol/L (ref 3.5–5.1)
Sodium: 140 mmol/L (ref 135–145)
Total Bilirubin: 0.9 mg/dL (ref <1.2)
Total Protein: 6 g/dL — ABNORMAL LOW (ref 6.5–8.1)

## 2023-11-22 LAB — GLUCOSE, CAPILLARY
Glucose-Capillary: 97 mg/dL (ref 70–99)
Glucose-Capillary: 118 mg/dL — ABNORMAL HIGH (ref 70–99)
Glucose-Capillary: 121 mg/dL — ABNORMAL HIGH (ref 70–99)

## 2023-11-22 LAB — HEPARIN LEVEL (UNFRACTIONATED): Heparin Unfractionated: <0.1 [IU]/mL — ABNORMAL LOW (ref 0.30–0.70)

## 2023-11-22 LAB — CALCIUM, IONIZED: Calcium, Ionized, Serum: 5 mg/dL (ref 4.5–5.6)

## 2023-11-22 LAB — SURGICAL PATHOLOGY

## 2023-11-22 LAB — TRIGLYCERIDES: Triglycerides: 138 mg/dL (ref <150)

## 2023-11-22 LAB — PHOSPHORUS: Phosphorus: 5.2 mg/dL — ABNORMAL HIGH (ref 2.5–4.6)

## 2023-11-22 LAB — MAGNESIUM: Magnesium: 2 mg/dL (ref 1.7–2.4)

## 2023-11-22 LAB — APTT: aPTT: 45 s — ABNORMAL HIGH (ref 24–36)

## 2023-11-22 MED ORDER — AMIODARONE HCL IN DEXTROSE 360-4.14 MG/200ML-% IV SOLN
60.0000 mg/h | INTRAVENOUS | Status: AC
  Administered 2023-11-22 (×2): 60 mg/h via INTRAVENOUS
  Filled 2023-11-22 (×2): qty 200

## 2023-11-22 MED ORDER — DEXMEDETOMIDINE HCL IN NACL 400 MCG/100ML IV SOLN
0.0000 ug/kg/h | INTRAVENOUS | Status: DC
Start: 2023-11-22 — End: 2023-11-24
  Administered 2023-11-22: 0.4 ug/kg/h via INTRAVENOUS
  Administered 2023-11-22: 0.5 ug/kg/h via INTRAVENOUS
  Administered 2023-11-23: 0.6 ug/kg/h via INTRAVENOUS
  Filled 2023-11-22 (×4): qty 100

## 2023-11-22 MED ORDER — AMIODARONE HCL IN DEXTROSE 360-4.14 MG/200ML-% IV SOLN
30.0000 mg/h | INTRAVENOUS | Status: AC
  Administered 2023-11-22 (×2): 30 mg/h via INTRAVENOUS
  Filled 2023-11-22: qty 200

## 2023-11-22 MED ORDER — HEPARIN BOLUS VIA INFUSION
2100.0000 [IU] | Freq: Once | INTRAVENOUS | Status: AC
  Administered 2023-11-22: 2100 [IU] via INTRAVENOUS
  Filled 2023-11-22: qty 2100

## 2023-11-22 MED ORDER — AMIODARONE LOAD VIA INFUSION
150.0000 mg | Freq: Once | INTRAVENOUS | Status: AC
  Administered 2023-11-22: 150 mg via INTRAVENOUS
  Filled 2023-11-22: qty 83.34

## 2023-11-22 MED ORDER — FERROUS SULFATE 220 (44 FE) MG/5ML PO SOLN
300.0000 mg | Freq: Every day | ORAL | Status: DC
Start: 2023-11-22 — End: 2023-11-24
  Administered 2023-11-22 – 2023-11-24 (×3): 300 mg
  Filled 2023-11-22 (×3): qty 6.82

## 2023-11-22 MED ORDER — POTASSIUM CHLORIDE 20 MEQ PO PACK
20.0000 meq | PACK | Freq: Once | ORAL | Status: AC
  Administered 2023-11-22: 20 meq
  Filled 2023-11-22: qty 1

## 2023-11-22 MED ORDER — HEPARIN (PORCINE) 25000 UT/250ML-% IV SOLN
1850.0000 [IU]/h | INTRAVENOUS | Status: DC
  Administered 2023-11-22: 1000 [IU]/h via INTRAVENOUS
  Administered 2023-11-23: 1550 [IU]/h via INTRAVENOUS
  Administered 2023-11-23: 1400 [IU]/h via INTRAVENOUS
  Administered 2023-11-24 – 2023-11-28 (×7): 1700 [IU]/h via INTRAVENOUS
  Administered 2023-11-29 – 2023-11-30 (×2): 1850 [IU]/h via INTRAVENOUS
  Filled 2023-11-22 (×14): qty 250

## 2023-11-22 MED ORDER — DEXMEDETOMIDINE HCL IN NACL 400 MCG/100ML IV SOLN
INTRAVENOUS | Status: AC
  Filled 2023-11-22: qty 100

## 2023-11-22 MED ORDER — ORAL CARE MOUTH RINSE: 15.0000 mL | OROMUCOSAL | Status: DC | PRN

## 2023-11-22 MED ORDER — ORAL CARE MOUTH RINSE
15.0000 mL | OROMUCOSAL | Status: DC
  Administered 2023-11-22 – 2023-11-24 (×30): 15 mL via OROMUCOSAL

## 2023-11-22 MED ORDER — SODIUM CHLORIDE 0.9 % IV SOLN
2.0000 g | INTRAVENOUS | Status: DC
  Administered 2023-11-22 – 2023-11-23 (×2): 2 g via INTRAVENOUS
  Filled 2023-11-22 (×2): qty 12.5

## 2023-11-22 NOTE — Consult Note (Signed)
PHARMACY - ANTICOAGULATION CONSULT NOTE  Pharmacy Consult for IV Heparin Indication: atrial fibrillation  Patient Measurements: Height: 5\' 3"  (160 cm) Weight: 80.8 kg (178 lb 2.1 oz) IBW/kg (Calculated) : 52.4 Heparin Dosing Weight: 70 kg  Labs: Recent Labs    11/19/23 1310 11/19/23 1455 11/19/23 2039 11/20/23 0252 11/20/23 2207 11/21/23 0402 11/22/23 0407  HGB 8.7*  --  8.0* 7.6* 7.6* 7.6* 7.4*  HCT 29.1*  --  25.8* 24.6* 23.9* 23.8* 23.4*  PLT 334  --  327 275  --  251 230  LABPROT 17.2*  --   --   --   --   --   --   INR 1.4*  --   --   --   --   --   --   CREATININE 2.95*  --  2.83* 2.80* 2.79* 2.66* 2.96*  CKTOTAL 134  --  291*  --   --   --   --   TROPONINIHS 208*   < > 749* 804* 293*  --   --    < > = values in this interval not displayed.   Estimated Creatinine Clearance: 17.8 mL/min (A) (by C-G formula based on SCr of 2.96 mg/dL (H)).  Medical History: Past Medical History:  Diagnosis Date   Acute hypoxemic respiratory failure (HCC)    Anemia in stage 4 chronic kidney disease (HCC)    Anginal pain (HCC)    Anxiety    Aortic atherosclerosis (HCC)    CHF (congestive heart failure) (HCC)    a.) TTE 09/30/2021: EF 55-60%, no RWMAs, mild MR, G1DD   Chronic pain    Chronic radicular pain of lower back    CKD (chronic kidney disease), stage IV (HCC)    Complication of anesthesia    a.) delayed emergence following ureteroscopy 07/2023   Coronary artery calcification seen on CT scan    Costochondritis    DDD (degenerative disc disease), lumbar    Depression    Dyspnea    GERD (gastroesophageal reflux disease)    Hiatal hernia    Hip dysplasia    Hyperlipidemia    Hypertension    Insomnia    Iron deficiency    Iron deficiency anemia    Low back pain    Low vitamin B12 level    Lumbar facet joint pain    Migraines    Nose colonized with MRSA 10/03/2020   a.) preop PCR (+) 10/03/2020 prior to RIGHT TKA; b.) preop PCR (+) 08/25/2023 prior to RIGHT REVERSE  SHOULDER ARTHROPLASTY; BICEPS TENODESIS   Numbness and tingling of right leg    OA (osteoarthritis)    Obesity    OSA (obstructive sleep apnea)    a.) not currently utilizing nocturnal PAP therapy; positional. PCCM feels as if symptom can be mitigated with diet/exercise/weight loss unless develops significant symptoms.   Osteoporosis    a.) recieves denosumab injections   Pelvic fracture (HCC)    Pneumonia    Pulmonary fibrosis (HCC)    Raynaud's disease without gangrene    Restless leg syndrome    a.) on pramipexole + oral Fe supplementation   Rheumatoid arthritis (HCC)    a.) Tx'd with hydroxychloroquine   Right renal mass 05/28/2023   a.) CT renal 05/28/2023:  5.7 cm mass in the medial aspect of right kidney   Seasonal allergies    Thoracic compression fracture (HCC)    Medications:  No anticoagulation prior to admission per my chart review  Assessment: 71  y/o F with a past medical history of CKD IV, HFpEF, hypertension, RA, OSA not on CPAP who came to [facility] 12/6 for right reverse shoulder arthroplasty after for suspicion of septic joint. Patient experienced cardiac arrest post-operatively on 12/7. She is intubated, sedated and on mechanical ventilation in the ICU. Hospital course now further complicated by paroxysmal Afib with RVR. Patient has been placed on amiodarone. Pharmacy consulted for heparin.  Baseline aPTT and PT-INR are pending. CBC is notable for anemia which is chronic.  Goal of Therapy:  Heparin level 0.3-0.7 units/ml Monitor platelets by anticoagulation protocol: Yes   Plan:  --Start heparin at 1000 units/hr, no bolus --HL 8 hours from start --Daily CBC per protocol while on IV heparin  Tressie Ellis 11/22/2023,11:38 AM

## 2023-11-22 NOTE — Progress Notes (Signed)
Patient's HR increased to 140s-150s and monitor reading A-fib. EKG obtained and in Epic. Dr. Aundria Rud made aware. No new orders at this time.

## 2023-11-22 NOTE — Consult Note (Signed)
PHARMACY - ANTICOAGULATION CONSULT NOTE  Pharmacy Consult for IV Heparin Indication: atrial fibrillation  Patient Measurements: Height: 5\' 3"  (160 cm) Weight: 80.8 kg (178 lb 2.1 oz) IBW/kg (Calculated) : 52.4 Heparin Dosing Weight: 70 kg  Labs: Recent Labs    11/20/23 0252 11/20/23 2207 11/21/23 0402 11/22/23 0407 11/22/23 1247 11/22/23 2029  HGB 7.6* 7.6* 7.6* 7.4*  --   --   HCT 24.6* 23.9* 23.8* 23.4*  --   --   PLT 275  --  251 230  --   --   APTT  --   --   --   --  45*  --   LABPROT  --   --   --   --  16.8*  --   INR  --   --   --   --  1.3*  --   HEPARINUNFRC  --   --   --   --   --  <0.10*  CREATININE 2.80* 2.79* 2.66* 2.96*  --   --   TROPONINIHS 804* 293*  --   --   --   --    Estimated Creatinine Clearance: 17.8 mL/min (A) (by C-G formula based on SCr of 2.96 mg/dL (H)).  Medical History: Past Medical History:  Diagnosis Date   Acute hypoxemic respiratory failure (HCC)    Anemia in stage 4 chronic kidney disease (HCC)    Anginal pain (HCC)    Anxiety    Aortic atherosclerosis (HCC)    CHF (congestive heart failure) (HCC)    a.) TTE 09/30/2021: EF 55-60%, no RWMAs, mild MR, G1DD   Chronic pain    Chronic radicular pain of lower back    CKD (chronic kidney disease), stage IV (HCC)    Complication of anesthesia    a.) delayed emergence following ureteroscopy 07/2023   Coronary artery calcification seen on CT scan    Costochondritis    DDD (degenerative disc disease), lumbar    Depression    Dyspnea    GERD (gastroesophageal reflux disease)    Hiatal hernia    Hip dysplasia    Hyperlipidemia    Hypertension    Insomnia    Iron deficiency    Iron deficiency anemia    Low back pain    Low vitamin B12 level    Lumbar facet joint pain    Migraines    Nose colonized with MRSA 10/03/2020   a.) preop PCR (+) 10/03/2020 prior to RIGHT TKA; b.) preop PCR (+) 08/25/2023 prior to RIGHT REVERSE SHOULDER ARTHROPLASTY; BICEPS TENODESIS   Numbness and  tingling of right leg    OA (osteoarthritis)    Obesity    OSA (obstructive sleep apnea)    a.) not currently utilizing nocturnal PAP therapy; positional. PCCM feels as if symptom can be mitigated with diet/exercise/weight loss unless develops significant symptoms.   Osteoporosis    a.) recieves denosumab injections   Pelvic fracture (HCC)    Pneumonia    Pulmonary fibrosis (HCC)    Raynaud's disease without gangrene    Restless leg syndrome    a.) on pramipexole + oral Fe supplementation   Rheumatoid arthritis (HCC)    a.) Tx'd with hydroxychloroquine   Right renal mass 05/28/2023   a.) CT renal 05/28/2023:  5.7 cm mass in the medial aspect of right kidney   Seasonal allergies    Thoracic compression fracture (HCC)    Medications:  No anticoagulation prior to admission per my chart review  Assessment: 72 y/o F with a past medical history of CKD IV, HFpEF, hypertension, RA, OSA not on CPAP who came to [facility] 12/6 for right reverse shoulder arthroplasty after for suspicion of septic joint. Patient experienced cardiac arrest post-operatively on 12/7. She is intubated, sedated and on mechanical ventilation in the ICU. Hospital course now further complicated by paroxysmal Afib with RVR. Patient has been placed on amiodarone. Pharmacy consulted for heparin.  Baseline aPTT and PT-INR are pending. CBC is notable for anemia which is chronic.  12/10 20:29 HL <0.10  Goal of Therapy:  Heparin level 0.3-0.7 units/ml Monitor platelets by anticoagulation protocol: Yes   Plan:  --Heparin bolus 2100 units --Increase heparin to 1200 units/hr --HL 8 hours from rate change --Daily CBC per protocol while on IV heparin  Clovia Cuff, PharmD, BCPS 11/22/2023 10:08 PM

## 2023-11-22 NOTE — Progress Notes (Addendum)
NAME:  ASTREA PENT, MRN:  [id number], DOB:  Apr 26, 1951, LOS: 4 ADMISSION DATE:  11/18/2023, CHIEF COMPLAINT:  Septic Joint  History of Present Illness:   This is a case of a 72 year old female patient with a past medical history of CKD stage IV, heart failure with preserved EF, hypertension, rheumatoid arthritis, OSA not on CPAP presents to [facility] on 12/06 for right reverse shoulder arthroplasty after for suspicion of septic joint.  She initially underwent right shoulder arthroplasty on 08/30/2023 after which she developed a.  Periprosthetic fracture status post conversion to a longstem humeral component with ORIF on 10/03/2023.  No evidence of infection at the time.  Follow-up lab work showed elevated inflammatory markers including ESR suspicious for an infectious process.  Therefore she presents back on 12/06 for shoulder arthroplasty explant of glenoid and humeral component open treatment of right humerus fracture with intramedullary device and insertion of antibiotic delivery system.  Small area of purulence was noted.  Cultures were sent.  Started on daptomycin and ceftriaxone.  She did well immediately postop however on 12/07 she was getting up to the bedside commode she went into respiratory distress with O2 sat dropping becoming apneic and went into PEA arrest.  CPR for roughly 5 to 6-minute prior to ROSC epi x 2.  Not shockable rhythm.  Patient intubated and transferred to the ICU for further care.  Postintubation chest x-ray with right pneumothorax status post chest tube placement 12/07.  HD catheter placed 12/07 anticipating need for dialysis given worsening kidney function.  Echocardiogram 12/08 with normal LVEF 55 to 60%.  RV systolic function is normal and size is normal.  Ultrasound venous lower extremity 12/08 negative for DVT.  Pertinent  Medical History  -RA on Hydoxychloroquine -HTN -HFpEF  Significant Hospital Events: Including procedures,  antibiotic start and stop dates in addition to other pertinent events   12/06 admitted for arthroplasty revision explant and insertion of antibiotic device. 12/7 PEA arrest intubated and admitted to the ICU.  Chest x-ray with right pneumothorax status post chest tube placement 12/07.  Status post HD catheter placement 12/07 with worsening kidney function. 12/08 remains intubated and sedated.  12/09 failed SBT due to hypoxia, tolerated PSV during the day 12/10 failed SBT due to low tidal volumes, hypoxia  Interim History / Subjective:  Opens eyes to commands, failed SBT  Objective   Blood pressure (!) 115/47, pulse 61, temperature 98.2 F (36.8 C), temperature source Axillary, resp. rate 18, height 5\' 3"  (1.6 m), weight 80.8 kg, SpO2 100%.    Vent Mode: PRVC FiO2 (%):  [40 %-80 %] 40 % Set Rate:  [18 bmp] 18 bmp Vt Set:  [350 mL-500 mL] 500 mL PEEP:  [5 cmH20-10 cmH20] 5 cmH20 Pressure Support:  [8 cmH20-10 cmH20] 8 cmH20 Plateau Pressure:  [23 cmH20] 23 cmH20   Intake/Output Summary (Last 24 hours) at 11/22/2023 0758 Last data filed at 11/22/2023 0753 Gross per 24 hour  Intake 1173.58 ml  Output 1590 ml  Net -416.42 ml   Filed Weights   11/20/23 0459 11/21/23 0500 11/22/23 0459  Weight: 84.7 kg 78.6 kg 80.8 kg    Examination: Physical Exam Constitutional:      General: She is not in acute distress.    Appearance: She is ill-appearing.  Cardiovascular:     Rate and Rhythm: Normal rate and regular rhythm.     Pulses: Normal pulses.     Heart sounds: Normal heart sounds.  Pulmonary:  Breath sounds: No wheezing.     Comments: Ventilated breath sounds bilaterally Abdominal:     Palpations: Abdomen is soft.  Musculoskeletal:     Right lower leg: No edema.     Left lower leg: No edema.  Neurological:     Mental Status: She is alert.     Motor: Weakness present.      Assessment & Plan:   72 year old female presented on 12/06 for right reverse shoulder  arthroplasty due to suspected septic joint complicated by post operative cardiac arrest (PEA).  Neurology #PEA arrest #Toxic Metabolic Encephalopathy  Developed witnessed hypoxic arrest with brief downtime requiring intubation for airway protection and hypoxic respiratory failure. She has been sedated with propofol and received fentanyl for pain control. Today, she is able to follow commands off of sedation, but failed SBT. Will transition to dexmedetomidine and re-attempt in AM.  -Maintain a RASS goal of -1 -Propofol to maintain RASS goal, fentanyl gtt for pain (follow CPOT) -Avoid sedating medications as able -Daily wake up assessment -Continue oxycodone 10 mg q6hours  Cardiovascular #PEA Arrest #NSTEMI #Circulatory Shock #Afib (new onset)  Cardiac arrest likely secondary to acute hypoxic respiratory failure in the setting of PRBC transfusion (?TACO worsened by CKD). TTE within normal, US of the lower extremities negative, normal RV, unlikely to have had a PE resulting in the cardiac arrest. Course also complicated by circulatory shock (likely distributive secondary to sepsis and sedation) requiring nor-epinephrine. Troponin elevation in the setting of the cardiac arrest as well as chest compressions.  Continues on nor-epinephrine for vasopressor support, and did develop afib with RVR today for which we've re-initiated amiodarone gtt. Will start a heparin gtt sans bolus for thrombo-prophylaxis.  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 Intubated, ventilated, and chest tube placed. Respiratory failure likely due to combination of pulmonary contusion, pulmonary edema (?TACO) and aspiration pneumonia. Does have copious secretions which were sent for  culture and for which she is on broad spectrum antibiotics. Independently reviewed her chest CT and the findings of subpleural reticulation are very mild. While she could have underlying RA-ILD, this is not contributing to her presentation at this point.  -Full vent support, implement lung protective strategies -Plateau pressures less than 30 cm H20 -Wean FiO2 & PEEP as tolerated to maintain O2 sats >92% -Chest tube to water seal, repeat CXR in PM and AM -daily SBT -Implement VAP Bundle -Prn Bronchodilators  Gastrointestinal  Continues on tube feeds, famotidine for SUP  Renal #AKI on CKD  History of CKD, with baseline creatinine around 2.4. Presented with AKI and dialysis catheter placed, though has not required dialysis.  Did receive furosemide yesterday and the day prior, with brisk urine output. Holding now given bump in creatinine. Likely her new baseline.  Endocrine  ICU glycemic protocol  Hem/Onc  Heparin SubQ for DVT prophylaxis now switched to heparin gtt given atrial fibrillation. Trend H/H and platelet count daily.  ID #R. Reverse shoulder arthroplasty (9/17) #Revision with converstion to long stemp humeroal component for periprosthetic fracture (10/21) #Right reverse shoulder arthroplasty explant of glenoid and humeral components for infection (12/6) #HAP  Prosthetic joint infection, hardware explanted 12/6, on Daptomycin and Ceftriaxone, with tissue cultures remaining negative. Given increased secretions and concern for HAP, have broadened to Cefepime pending respiratory cultures.  Appreciate input from ID and orthopedics.   Best Practice (right click and "Reselect all SmartList Selections" daily)   Diet/type: tubefeeds DVT prophylaxis systemic heparin Pressure ulcer(s): N/A GI prophylaxis: H2B Lines: Central line and yes and it is still needed Foley:  Yes, and it is still needed Code Status:  full code Last date of multidisciplinary goals of care discussion  [11/22/2023]  Labs   CBC: Recent Labs  Lab 11/19/23 0216 11/19/23 1310 11/19/23 2039 11/20/23 0252 11/20/23 2207 11/21/23 0402 11/22/23 0407  WBC 8.6 14.8* 27.5* 22.6*  --  15.3* 14.6*  NEUTROABS 7.1 9.3*  --   --   --   --   --   HGB 7.1* 8.7* 8.0* 7.6* 7.6* 7.6* 7.4*  HCT 23.4* 29.1* 25.8* 24.6* 23.9* 23.8* 23.4*  MCV 92.9 96.7 92.1 91.1  --  89.5 92.9  PLT 304 334 327 275  --  251 230    Basic Metabolic Panel: Recent Labs  Lab 11/19/23 2039 11/20/23 0252 11/20/23 2207 11/21/23 0402 11/22/23 0407  NA 140 138 137 137 140  K 5.2* 5.1 3.9 3.7 3.9  CL 106 104 104 104 104  CO2 24 24 23 25  21*  GLUCOSE 121* 103* 112* 107* 96  BUN 71* 73* 67* 67* 72*  CREATININE 2.83* 2.80* 2.79* 2.66* 2.96*  CALCIUM 8.9 8.8* 8.5* 8.6* 8.7*  MG 2.2 2.1  --  2.0 2.0  PHOS 5.8* 4.9*  --  4.0 5.2*   GFR: Estimated Creatinine Clearance: 17.8 mL/min (A) (by C-G formula based on SCr of 2.96 mg/dL (H)). Recent Labs  Lab 11/19/23 1311 11/19/23 1455 11/19/23 2039 11/20/23 0252 11/21/23 0402 11/22/23 0407  WBC  --   --  27.5* 22.6* 15.3* 14.6*  LATICACIDVEN 7.3* 2.2* 1.5  --   --   --     Liver Function Tests: Recent Labs  Lab 11/19/23 1310 11/19/23 2039 11/20/23 0252 11/21/23 0402 11/22/23 0407  AST 96* 122* 102* 51* 36  ALT 55* 54* 41 20 16  ALKPHOS 135* 124 115 120 152*  BILITOT 0.9 0.6 0.7 0.8 0.9  PROT 6.9 6.7 6.3* 6.1* 6.0*  ALBUMIN 2.8* 2.6* 2.6* 2.4* 2.1*   No results for input(s): "LIPASE", "AMYLASE" in the last 168 hours. No results for input(s): "AMMONIA" in the last 168 hours.  ABG    Component Value Date/Time   PHART 7.33 (L) 11/21/2023 1742   PCO2ART 46 11/21/2023 1742   PO2ART 143 (H) 11/21/2023 1742   HCO3 24.3 11/21/2023 1742   ACIDBASEDEF 1.9 11/21/2023 1742   O2SAT 98.5 11/21/2023 1742     Coagulation Profile: Recent Labs  Lab 11/19/23 1310  INR 1.4*    Cardiac Enzymes: Recent Labs  Lab 11/19/23 0216 11/19/23 1310 11/19/23 2039   CKTOTAL 108 134 291*    HbA1C: No results found for: "HGBA1C"  CBG: Recent Labs  Lab 11/19/23 1321 11/20/23 0209 11/21/23 2326  GLUCAP 136* 87 84    Past Medical History:  She,  has a past medical history of Acute hypoxemic respiratory failure (HCC), Anemia in stage 4 chronic kidney disease (HCC), Anginal pain (HCC), Anxiety, Aortic atherosclerosis (HCC), CHF (congestive heart failure) (HCC), Chronic pain, Chronic radicular pain of lower back, CKD (chronic kidney disease), stage IV (HCC), Complication of anesthesia, Coronary artery calcification seen on CT scan, Costochondritis, DDD (degenerative disc disease), lumbar, Depression, Dyspnea, GERD (gastroesophageal reflux disease), Hiatal hernia, Hip dysplasia, Hyperlipidemia, Hypertension, Insomnia, Iron deficiency, Iron deficiency anemia, Low back pain, Low vitamin  B12 level, Lumbar facet joint pain, Migraines, Nose colonized with MRSA (10/03/2020), Numbness and tingling of right leg, OA (osteoarthritis), Obesity, OSA (obstructive sleep apnea), Osteoporosis, Pelvic fracture (HCC), Pneumonia, Pulmonary fibrosis (HCC), Raynaud's disease without gangrene, Restless leg syndrome, Rheumatoid arthritis (HCC), Right renal mass (05/28/2023), Seasonal allergies, and Thoracic compression fracture (HCC).   Surgical History:   Past Surgical History:  Procedure Laterality Date   ABDOMINAL SURGERY     pt denies   APPENDECTOMY     CARPAL TUNNEL RELEASE Bilateral    CHOLECYSTECTOMY     COLONOSCOPY WITH PROPOFOL N/A 08/15/2020   Procedure: COLONOSCOPY WITH PROPOFOL;  Surgeon: Earline Mayotte, MD;  Location: [facility];  Service: Endoscopy;  Laterality: N/A;   CYSTOSCOPY W/ RETROGRADES Right 07/18/2023   Procedure: CYSTOSCOPY WITH RETROGRADE PYELOGRAM;  Surgeon: [name], MD;  Location: [facility];  Service: Urology;  Laterality: Right;   DILATION AND CURETTAGE OF UTERUS     ESOPHAGOGASTRODUODENOSCOPY (EGD) WITH PROPOFOL N/A 08/15/2020    Procedure: ESOPHAGOGASTRODUODENOSCOPY (EGD) WITH PROPOFOL;  Surgeon: Earline Mayotte, MD;  Location: [facility];  Service: Endoscopy;  Laterality: N/A;   FRACTURE SURGERY     hip fracture    FRACTURE SURGERY     pelvic fracture plate    HIP SURGERY Left    KNEE ARTHROPLASTY Right 10/13/2020   Procedure: COMPUTER ASSISTED TOTAL KNEE ARTHROPLASTY - RNFA;  Surgeon: Donato Heinz, MD;  Location: [facility];  Service: Orthopedics;  Laterality: Right;   REVERSE SHOULDER ARTHROPLASTY Right 08/30/2023   Procedure: Right reverse shoulder arthroplasty, biceps tenodesis;  Surgeon: Signa Kell, MD;  Location: [facility];  Service: Orthopedics;  Laterality: Right;   TOTAL SHOULDER REVISION Right 10/03/2023   Procedure: Revision right reverse shoulder arthroplasty with conversion to long humeral stem and open reduction internal fixation of the humerus;  Surgeon: Signa Kell, MD;  Location: [facility];  Service: Orthopedics;  Laterality: Right;   TUBAL LIGATION     URETEROSCOPY  07/18/2023   Procedure: DIAGNOSTIC URETEROSCOPY;  Surgeon: [name], MD;  Location: [facility];  Service: Urology;;     Social History:   reports that she has never smoked. She has never used smokeless tobacco. She reports that she does not drink alcohol and does not use drugs.   Family History:  Her family history includes Aneurysm in her father; Cancer in her brother; Diabetes in her brother, maternal grandfather, mother, and son; Heart disease in her maternal grandfather; Hypertension in her mother; Osteosarcoma in her brother; Seizures in her son.   Allergies Allergies  Allergen Reactions   Gabapentin Other (See Comments)    Weight gain   Ibuprofen Other (See Comments)    Headache     Home Medications  Prior to Admission medications   Medication Sig Start Date End Date Taking? Authorizing Provider  acetaminophen (TYLENOL) 500 MG tablet Take 2 tablets (1,000 mg total) by mouth every 6 (six) hours as needed.  08/31/23  Yes Anson Oregon, PA-C  aspirin EC 325 MG tablet Take 1 tablet (325 mg total) by mouth daily. 08/31/23  Yes Anson Oregon, PA-C  calcitRIOL (ROCALTROL) 0.25 MCG capsule Take 0.25 mcg by mouth every morning. 05/22/21  Yes [provider]  Calcium Carb-Cholecalciferol 600-400 MG-UNIT CAPS Take 1 capsule by mouth 3 (three) times daily. 01/13/10  Yes [provider]  chlorhexidine (HIBICLENS) 4 % external liquid Apply 15 mLs (1 Application total) topically as directed for 30 doses. Use as directed daily for 5 days  every other week for 6 weeks. 11/18/23  Yes Signa Kell, MD  Cholecalciferol 25 MCG (1000 UT) tablet Take 1,000 Units by mouth every morning. 09/23/09  Yes [provider]  cyanocobalamin 1000 MCG tablet Take 1,000 mcg by mouth every morning.   Yes [provider]  cyclobenzaprine (FLEXERIL) 10 MG tablet Take 10 mg by mouth at bedtime as needed for muscle spasms (Leg cramps).   Yes [provider]  enalapril-hydrochlorothiazide (VASERETIC) 10-25 MG tablet Take 1 tablet by mouth at bedtime. 09/12/23  Yes Hammock, Lavonna Rua, NP  ferrous sulfate 325 (65 FE) MG tablet Take 325 mg by mouth daily with breakfast.   Yes [provider]  furosemide (LASIX) 40 MG tablet Take 1.5 tablets (60 mg total) by mouth every morning. Increase to 1.5 tablet (60 mg total) by mouth in morning and extra 1 tablet (40 mg total for maximum daily dose 100 mg) at lunch time as needed for up to 3 days for increased leg swelling, shortness of breath, weight gain 5+ lbs over 1-2 days. Seek medical care if these symptoms are not improving with increased dose. 09/02/23  Yes Sunnie Nielsen, DO  hydroxychloroquine (PLAQUENIL) 200 MG tablet Take 200 mg by mouth 2 (two) times daily. 06/22/21  Yes [provider]  mirtazapine (REMERON) 30 MG tablet Take 30 mg by mouth at bedtime. 06/29/22  Yes [provider]  Multiple Vitamins-Minerals (MULTIVITAMIN  ADULT PO) Take 1 tablet by mouth every morning. 09/10/08  Yes [provider]  mupirocin ointment (BACTROBAN) 2 % Apply small about inside of both nostrils TWICE a day for the next 5 days. 08/25/23  Yes Verlee Monte, NP  mupirocin ointment (BACTROBAN) 2 % Place 1 Application into the nose 2 (two) times daily for 60 doses. Use as directed 2 times daily for 5 days every other week for 6 weeks. 11/18/23 12/18/23 Yes Signa Kell, MD  omeprazole (PRILOSEC) 40 MG capsule Take 40 mg by mouth every morning.   Yes [provider]  ondansetron (ZOFRAN) 4 MG tablet Take 1 tablet (4 mg total) by mouth every 6 (six) hours as needed for nausea. 08/31/23  Yes Anson Oregon, PA-C  oxyCODONE (OXY IR/ROXICODONE) 5 MG immediate release tablet Take 1 tablet (5 mg total) by mouth every 4 (four) hours as needed for moderate pain (pain score 4-6) (pain score 4-6). 10/04/23  Yes Dedra Skeens, PA-C  Pirfenidone 267 MG TABS Take 801 tablets by mouth 3 (three) times daily. 12/24/22  Yes [provider]  pramipexole (MIRAPEX) 0.125 MG tablet Take 0.125-0.25 mg by mouth See admin instructions. Take 0.125 mg in the morning and 0.25 mg  at bedtime 02/15/18  Yes [provider]  PROLIA 60 MG/ML SOSY injection Inject 60 mg into the skin every 6 (six) months. 04/06/23  Yes [provider]  rosuvastatin (CRESTOR) 5 MG tablet Take 5 mg by mouth every other day. 07/18/19  Yes [provider]  spironolactone (ALDACTONE) 25 MG tablet Take 25 mg by mouth at bedtime. 04/09/21  Yes [provider]  tetrahydrozoline 0.05 % ophthalmic solution Place 1 drop into both eyes daily as needed (allergies).   Yes [provider]  venlafaxine XR (EFFEXOR-XR) 150 MG 24 hr capsule Take 150 mg by mouth See admin instructions. Take with 75 mg for total 225 mg in the morning 12/28/22 12/28/23 Yes [provider]  venlafaxine XR (EFFEXOR-XR) 75 MG 24 hr capsule Take 75 mg by mouth See  admin instructions.  Take with 150 mg for a total of 225 mg in the morning 08/24/23  Yes [provider]  chlorhexidine (HIBICLENS) 4 % external liquid Apply 15 mLs (1 Application total) topically as directed for 30 doses. Use as directed daily for 5 days every other week for 6 weeks. Patient not taking: Reported on 11/17/2023 10/03/23   Signa Kell, MD     Critical care time: 23 minutes    Raechel Chute, MD [facility] 11/22/2023 6:18 PM

## 2023-11-22 NOTE — Progress Notes (Signed)
PHARMACY CONSULT NOTE  Pharmacy Consult for Electrolyte Monitoring and Replacement   Recent Labs: Potassium (mmol/L)  Date Value  11/22/2023 3.9   Magnesium (mg/dL)  Date Value  21/30/8657 2.0   Calcium (mg/dL)  Date Value  84/69/6295 8.7 (L)   Albumin (g/dL)  Date Value  28/41/3244 2.1 (L)   Phosphorus (mg/dL)  Date Value  12/15/7251 5.2 (H)   Sodium (mmol/L)  Date Value  11/22/2023 140  09/12/2023 142   Assessment: 72 y.o. female with medical history significant of CKD Stage 4, HFpEF, HTN, B12 deficiency, anemia of CKD, rheumatoid arthritis, Raynaud's phenomenon, OSA not on CPAP, who was admitted on 12/6 for recurrent R shoulder revision following arthroplasty.   Diuretics: Intermittent IV Lasix  Goal of Therapy:  K >= 4, Mg >= 2  Plan:  --K 3.9, Kcl 20 mEq per tube x 1 dose --Labs tomorrow  Tressie Ellis 11/22/2023 8:18 AM

## 2023-11-22 NOTE — Progress Notes (Signed)
Late entry: NG tube feedings stopped, patient put to low intermittent suction at 2030 due to patient having emesis episode. Patient was suctioned at this time. Provider notified. No new orders received.

## 2023-11-22 NOTE — Progress Notes (Signed)
Date of Admission:  11/18/2023      ID: Tonya Cummings is a 72 y.o. female Principal Problem:   Periprosthetic fracture around internal prosthetic right shoulder joint Active Problems:   Hypertension   Seropositive rheumatoid arthritis (HCC)   Septic arthritis (HCC)   Acute on chronic anemia   (HFpEF) heart failure with preserved ejection fraction (HCC)   CKD (chronic kidney disease) stage 4, GFR 15-29 ml/min (HCC)   ILD (interstitial lung disease) (HCC)   Tension pneumothorax   Cardiac arrest, cause unspecified (HCC)    Subjective: Remains intubated  Medications:   acetaminophen  650 mg Per Tube Q6H   aspirin  325 mg Per Tube Daily   calcitRIOL  0.25 mcg Per Tube q morning   calcium-vitamin D  1 tablet Per Tube TID   Chlorhexidine Gluconate Cloth  6 each Topical Daily   cholecalciferol  1,000 Units Per Tube q morning   cyanocobalamin  1,000 mcg Per Tube q morning   docusate  100 mg Per Tube BID   feeding supplement (PROSource TF20)  60 mL Per Tube Daily   ferrous sulfate  300 mg Per Tube Daily   free water  30 mL Per Tube Q4H   lidocaine  1 patch Transdermal Q24H   mupirocin ointment   Topical BID   mouth rinse  15 mL Mouth Rinse Q2H   oxyCODONE  10 mg Per Tube Q6H   polyethylene glycol  17 g Per Tube Daily   pramipexole  0.125 mg Per Tube q morning   And   pramipexole  0.25 mg Per Tube QHS   sodium chloride flush  3 mL Intravenous Q12H   sodium chloride flush  3 mL Intravenous Q12H    Objective: Vital signs in last 24 hours: Patient Vitals for the past 24 hrs:  BP Temp Temp src Pulse Resp SpO2 Weight  11/22/23 1515 (!) 117/49 -- -- (!) 50 18 100 % --  11/22/23 1500 122/60 -- -- (!) 50 18 100 % --  11/22/23 1445 (!) 108/53 -- -- (!) 50 18 100 % --  11/22/23 1430 (!) 118/56 -- -- (!) 51 18 100 % --  11/22/23 1415 (!) 113/58 -- -- 73 16 100 % --  11/22/23 1405 130/64 -- -- (!) 49 18 100 % --  11/22/23 1400 -- -- -- 84 (!) 26 97 % --  11/22/23 1345 108/73  -- -- 87 18 96 % --  11/22/23 1330 121/61 -- -- 85 18 94 % --  11/22/23 1315 103/68 -- -- (!) 144 18 94 % --  11/22/23 1300 113/62 -- -- 95 18 94 % --  11/22/23 1245 124/81 -- -- 65 18 100 % --  11/22/23 1230 125/63 -- -- 95 18 100 % --  11/22/23 1225 (!) 112/55 -- -- (!) 102 18 100 % --  11/22/23 1220 129/75 -- -- (!) 140 20 100 % --  11/22/23 1215 (!) 105/50 -- -- 97 18 99 % --  11/22/23 1210 (!) 93/58 -- -- (!) 146 17 98 % --  11/22/23 1205 (!) 84/41 -- -- (!) 134 18 100 % --  11/22/23 1200 (!) 81/36 99.4 F (37.4 C) Axillary 100 18 99 % --  11/22/23 1145 (!) 86/48 -- -- (!) 118 18 98 % --  11/22/23 1130 109/78 -- -- (!) 150 17 100 % --  11/22/23 1115 97/63 -- -- (!) 150 18 99 % --  11/22/23 1100 (!) 124/59 -- -- Marland Kitchen  148 16 99 % --  11/22/23 1047 138/81 -- -- (!) 136 18 99 % --  11/22/23 1045 138/81 -- -- (!) 140 (!) 22 100 % --  11/22/23 1040 -- -- -- -- -- 100 % --  11/22/23 1035 109/72 -- -- 98 20 99 % --  11/22/23 1030 (!) 148/111 -- -- 68 19 100 % --  11/22/23 1015 (!) 144/58 -- -- 78 18 100 % --  11/22/23 1000 (!) 141/59 -- -- 78 18 100 % --  11/22/23 0945 (!) 141/80 -- -- 78 17 100 % --  11/22/23 0930 (!) 117/48 -- -- 62 18 100 % --  11/22/23 0918 (!) 137/47 -- -- (!) 58 (!) 22 100 % --  11/22/23 0915 (!) 114/33 -- -- (!) 57 18 100 % --  11/22/23 0900 (!) 129/49 -- -- 69 18 100 % --  11/22/23 0845 (!) 120/41 -- -- 71 18 100 % --  11/22/23 0830 (!) 117/43 -- -- 67 18 100 % --  11/22/23 0815 (!) 127/52 -- -- 71 18 100 % --  11/22/23 0800 (!) 136/58 -- -- 65 18 100 % --  11/22/23 0748 -- 98.2 F (36.8 C) Axillary (!) 40 18 100 % --  11/22/23 0745 (!) 115/47 -- -- 61 18 100 % --  11/22/23 0730 (!) 119/48 -- -- 70 18 100 % --  11/22/23 0722 -- -- -- -- -- 100 % --  11/22/23 0715 (!) 121/53 -- -- (!) 114 (!) 21 100 % --  11/22/23 0700 (!) 125/45 -- -- 61 18 100 % --  11/22/23 0645 (!) 115/52 -- -- (!) 55 18 100 % --  11/22/23 0630 (!) 112/53 -- -- 62 18 100 % --   11/22/23 0615 (!) 113/48 -- -- (!) 112 18 100 % --  11/22/23 0600 (!) 111/47 -- -- 62 18 100 % --  11/22/23 0545 (!) 106/46 -- -- (!) 48 16 100 % --  11/22/23 0530 (!) 116/47 -- -- 67 16 100 % --  11/22/23 0515 (!) 120/47 -- -- 62 18 100 % --  11/22/23 0500 114/63 -- -- 69 18 100 % --  11/22/23 0459 -- -- -- -- -- -- 80.8 kg  11/22/23 0445 (!) 125/56 -- -- (!) 52 18 100 % --  11/22/23 0430 (!) 149/53 -- -- 69 18 100 % --  11/22/23 0415 (!) 103/43 -- -- 69 18 100 % --  11/22/23 0400 -- 98.4 F (36.9 C) Axillary 67 18 100 % --  11/22/23 0345 (!) 126/51 -- -- (!) 59 18 100 % --  11/22/23 0330 (!) 124/49 -- -- (!) 46 18 100 % --  11/22/23 0315 (!) 132/53 -- -- 62 18 100 % --  11/22/23 0300 (!) 103/43 -- -- (!) 48 18 100 % --  11/22/23 0245 (!) 121/57 -- -- 65 18 100 % --  11/22/23 0230 (!) 106/46 -- -- 66 18 99 % --  11/22/23 0200 (!) 123/50 -- -- 67 18 100 % --  11/22/23 0130 (!) 121/52 -- -- 66 18 99 % --  11/22/23 0100 (!) 110/48 -- -- (!) 48 18 100 % --  11/22/23 0030 (!) 109/50 -- -- 65 18 99 % --  11/22/23 0015 -- -- -- 72 18 96 % --  11/22/23 0000 (!) 105/54 98.6 F (37 C) Axillary 70 18 95 % --  11/21/23 2345 -- -- -- 66 15 99 % --  11/21/23 2330 Marland Kitchen)  106/51 -- -- 66 15 100 % --  11/21/23 2300 (!) 124/58 -- -- 62 15 100 % --  11/21/23 2245 (!) 102/49 -- -- 66 15 100 % --  11/21/23 2230 (!) 95/49 -- -- 66 15 100 % --  11/21/23 2215 (!) 110/44 -- -- 72 15 97 % --  11/21/23 2210 (!) 101/55 -- -- 68 15 100 % --  11/21/23 2208 (!) 104/51 -- -- 76 15 99 % --  11/21/23 2206 (!) 93/49 -- -- 74 15 99 % --  11/21/23 2204 (!) 84/52 -- -- 84 15 100 % --  11/21/23 2202 (!) 67/38 -- -- 85 15 100 % --  11/21/23 2200 (!) 70/35 -- -- 84 15 100 % --  11/21/23 2115 (!) 108/53 -- -- 96 11 99 % --  11/21/23 2100 (!) 110/51 -- -- 95 18 99 % --  11/21/23 2045 (!) 116/51 -- -- 97 19 98 % --  11/21/23 2030 (!) 122/52 -- -- (!) 104 19 96 % --  11/21/23 2014 -- -- -- -- -- 94 % --  11/21/23 2000  (!) 115/52 98.3 F (36.8 C) Axillary 85 18 95 % --  11/21/23 1915 119/60 -- -- 87 11 94 % --  11/21/23 1900 (!) 127/54 -- -- 84 12 93 % --  11/21/23 1845 (!) 126/56 -- -- 83 13 93 % --  11/21/23 1830 (!) 131/55 -- -- 82 18 97 % --  11/21/23 1815 (!) 127/57 -- -- 78 15 97 % --  11/21/23 1800 (!) 121/51 -- -- 69 17 98 % --  11/21/23 1745 (!) 126/54 -- -- 74 14 99 % --  11/21/23 1730 (!) 121/50 -- -- 71 (!) 25 100 % --  11/21/23 1715 -- -- -- 73 (!) 22 100 % --  11/21/23 1700 -- -- -- 70 15 100 % --  11/21/23 1645 (!) 104/58 -- -- 68 12 99 % --  11/21/23 1641 -- -- -- -- -- 99 % --  11/21/23 1630 (!) 119/55 -- -- 70 17 100 % --  11/21/23 1615 121/62 -- -- 73 14 100 % --  11/21/23 1600 128/65 -- -- 78 17 99 % --  11/21/23 1545 (!) 126/56 -- -- 87 18 99 % --      PHYSICAL EXAM:  General: intubated Head: Normocephalic, without obvious abnormality, atraumatic. Eyes: Conjunctivae clear, anicteric sclerae. Pupils are equal ENT cannot be examined Neck:  symmetrical, no adenopathy, thyroid: non tender no carotid bruit and no JVD. Lungs:decreased air entry rt side Chest drain. Heart: tachycardia Abdomen: Soft, non-tender,not distended. Bowel sounds normal. No masses Extremities: rt shoulder surgical site in sling/dressing Skin: some bruising over chest Lymph: Cervical, supraclavicular normal. Neurologic: cannot be assessed Lab Results    Latest Ref Rng & Units 11/22/2023    4:07 AM 11/21/2023    4:02 AM 11/20/2023   10:07 PM  CBC  WBC 4.0 - 10.5 K/uL 14.6  15.3    Hemoglobin 12.0 - 15.0 g/dL 7.4  7.6  7.6   Hematocrit 36.0 - 46.0 % 23.4  23.8  23.9   Platelets 150 - 400 K/uL 230  251         Latest Ref Rng & Units 11/22/2023    4:07 AM 11/21/2023    4:02 AM 11/20/2023   10:07 PM  CMP  Glucose 70 - 99 mg/dL 96  409  811   BUN 8 - 23 mg/dL 72  67  67  Creatinine 0.44 - 1.00 mg/dL 1.61  0.96  0.45   Sodium 135 - 145 mmol/L 140  137  137   Potassium 3.5 - 5.1 mmol/L 3.9  3.7   3.9   Chloride 98 - 111 mmol/L 104  104  104   CO2 22 - 32 mmol/L 21  25  23    Calcium 8.9 - 10.3 mg/dL 8.7  8.6  8.5   Total Protein 6.5 - 8.1 g/dL 6.0  6.1    Total Bilirubin <1.2 mg/dL 0.9  0.8    Alkaline Phos 38 - 126 U/L 152  120    AST 15 - 41 U/L 36  51    ALT 0 - 44 U/L 16  20        Microbiology:  Studies/Results: DG Chest Port 1 View  Result Date: 11/22/2023 CLINICAL DATA:  72 year old female with history of chest tube. Follow-up study. EXAM: PORTABLE CHEST 1 VIEW COMPARISON:  Chest x-ray 11/21/2023. FINDINGS: An endotracheal tube is in place with tip 2.7 cm above the carina. Left internal jugular Vas-Cath with tip terminating in the right atrium. Small bore right-sided chest tube with pigtail reformed over the medial right hemithorax. A nasogastric tube is seen extending into the stomach, however, the tip of the nasogastric tube extends below the lower margin of the image. Lung volumes are slightly low. Diffuse interstitial prominence, peribronchial cuffing and patchy ill-defined opacities are again noted throughout the lungs bilaterally (right greater than left), overall with slightly improved aeration compared to the prior examination. No pleural effusions. No definite pneumothorax. Heart size appears borderline enlarged. Upper mediastinal contours are within normal limits. Status post right shoulder arthroplasty. IMPRESSION: 1. Support apparatus, as above. 2. Slight improved aeration in the lungs which may reflect resolving multilobar bilateral bronchopneumonia. Electronically Signed   By: Trudie Reed M.D.   On: 11/22/2023 06:49   DG Chest Port 1 View  Result Date: 11/21/2023 CLINICAL DATA:  Pneumothorax EXAM: PORTABLE CHEST 1 VIEW COMPARISON:  11/20/2023 FINDINGS: Indwelling right pigtail chest tube. Suspected tiny right apical pneumothorax. Multifocal patchy opacities in the lungs bilaterally, right lower lobe predominant. No pleural effusion. The heart is top-normal in  size. Endotracheal tube terminates 3.1 cm above the carina. Enteric tube courses into the diaphragm. Left IJ dual lumen dialysis catheter terminates in the right atrium. Right shoulder arthroplasty with overlying skin staples. IMPRESSION: Indwelling right pigtail chest tube. Suspected tiny right apical pneumothorax. Multifocal patchy opacities in the lungs bilaterally, right lower lobe predominant. Support apparatus as above. Electronically Signed   By: Charline Bills M.D.   On: 11/21/2023 17:28   DG Abd 1 View  Result Date: 11/21/2023 CLINICAL DATA:  Orogastric tube placement. EXAM: ABDOMEN - 1 VIEW COMPARISON:  None Available. FINDINGS: Distal tip of nasogastric tube is seen in expected position of the stomach. IMPRESSION: Distal tip of nasogastric tube is seen in expected position of the stomach. Electronically Signed   By: Lupita Raider M.D.   On: 11/21/2023 10:25     Assessment/Plan: Rt  shoulder reverse arthroplasty Prosthetic joint infection Explanation of hardware on 11/18/23 Multiple cultures sent Pt on daptomycin and ceftriaxone- continue- will de-esclatae depending on culture result   Cardiac arrest secondary to acute resp failure Pt intubated 2 d echo No RV strain   Tension Pneumothorax post CPR Has rt chest drain   Iron def anemia   CKD   Rheumatoid arthritis   Rt renal mass .   Discussed the management with  care team

## 2023-11-22 NOTE — Plan of Care (Signed)
  Problem: Nutrition: Goal: Adequate nutrition will be maintained Outcome: Progressing   Problem: Coping: Goal: Level of anxiety will decrease Outcome: Progressing   Problem: Activity: Goal: Ability to tolerate increased activity will improve Outcome: Progressing   Problem: Pain Management: Goal: Pain level will decrease with appropriate interventions Outcome: Progressing   Problem: Activity: Goal: Ability to tolerate increased activity will improve Outcome: Progressing   Problem: Clinical Measurements: Goal: Ability to maintain clinical measurements within normal limits will improve Outcome: Not Progressing Goal: Will remain free from infection Outcome: Not Progressing Goal: Diagnostic test results will improve Outcome: Not Progressing Goal: Respiratory complications will improve Outcome: Not Progressing Goal: Cardiovascular complication will be avoided Outcome: Not Progressing   Problem: Elimination: Goal: Will not experience complications related to bowel motility Outcome: Not Progressing   Problem: Respiratory: Goal: Ability to maintain a clear airway and adequate ventilation will improve Outcome: Not Progressing

## 2023-11-22 NOTE — Plan of Care (Signed)
  Problem: Clinical Measurements: Goal: Ability to maintain clinical measurements within normal limits will improve Outcome: Progressing Goal: Diagnostic test results will improve Outcome: Progressing Goal: Respiratory complications will improve Outcome: Progressing   Problem: Skin Integrity: Goal: Risk for impaired skin integrity will decrease Outcome: Progressing   Problem: Respiratory: Goal: Ability to maintain a clear airway and adequate ventilation will improve Outcome: Progressing

## 2023-11-22 NOTE — Progress Notes (Signed)
Subjective: 4 Days Post-Op Procedure(s) (LRB): Right revision reverse shoulder arthroplasty (conversion to longstem and glenosphere exchange) (Right) Patient in the ICU.  Patient is intubated, with chest tube.  Status postcardiac arrest with tension pneumothorax.  Objective: Vital signs in last 24 hours: Temp:  [98.3 F (36.8 C)-99 F (37.2 C)] 98.4 F (36.9 C) (12/10 0400) Pulse Rate:  [46-112] 55 (12/10 0645) Resp:  [11-25] 18 (12/10 0645) BP: (67-149)/(35-77) 115/52 (12/10 0645) SpO2:  [85 %-100 %] 100 % (12/10 0645) FiO2 (%):  [40 %-80 %] 40 % (12/10 0400) Weight:  [80.8 kg] 80.8 kg (12/10 0459)  Intake/Output from previous day: 12/09 0701 - 12/10 0700 In: 1173.6 [I.V.:623.9; NG/GT:399.7; IV Piggyback:150] Out: 1465 [Urine:1255; Emesis/NG output:150; Chest Tube:60] Intake/Output this shift: No intake/output data recorded.  Recent Labs    11/19/23 2039 11/20/23 0252 11/20/23 2207 11/21/23 0402 11/22/23 0407  HGB 8.0* 7.6* 7.6* 7.6* 7.4*   Recent Labs    11/21/23 0402 11/22/23 0407  WBC 15.3* 14.6*  RBC 2.66* 2.52*  HCT 23.8* 23.4*  PLT 251 230   Recent Labs    11/21/23 0402 11/22/23 0407  NA 137 140  K 3.7 3.9  CL 104 104  CO2 25 21*  BUN 67* 72*  CREATININE 2.66* 2.96*  GLUCOSE 107* 96  CALCIUM 8.6* 8.7*   Recent Labs    11/19/23 1310  INR 1.4*    EXAM General - Patient is intubated, nonresponsive Extremity - Intact pulses distally No cellulitis present Compartment soft Dressing intact and dry Dressing - dressing C/D/I and no drainage, shoulder immobilizer in place.   Past Medical History:  Diagnosis Date   Acute hypoxemic respiratory failure (HCC)    Anemia in stage 4 chronic kidney disease (HCC)    Anginal pain (HCC)    Anxiety    Aortic atherosclerosis (HCC)    CHF (congestive heart failure) (HCC)    a.) TTE 09/30/2021: EF 55-60%, no RWMAs, mild MR, G1DD   Chronic pain    Chronic radicular pain of lower back    CKD (chronic  kidney disease), stage IV (HCC)    Complication of anesthesia    a.) delayed emergence following ureteroscopy 07/2023   Coronary artery calcification seen on CT scan    Costochondritis    DDD (degenerative disc disease), lumbar    Depression    Dyspnea    GERD (gastroesophageal reflux disease)    Hiatal hernia    Hip dysplasia    Hyperlipidemia    Hypertension    Insomnia    Iron deficiency    Iron deficiency anemia    Low back pain    Low vitamin B12 level    Lumbar facet joint pain    Migraines    Nose colonized with MRSA 10/03/2020   a.) preop PCR (+) 10/03/2020 prior to RIGHT TKA; b.) preop PCR (+) 08/25/2023 prior to RIGHT REVERSE SHOULDER ARTHROPLASTY; BICEPS TENODESIS   Numbness and tingling of right leg    OA (osteoarthritis)    Obesity    OSA (obstructive sleep apnea)    a.) not currently utilizing nocturnal PAP therapy; positional. PCCM feels as if symptom can be mitigated with diet/exercise/weight loss unless develops significant symptoms.   Osteoporosis    a.) recieves denosumab injections   Pelvic fracture (HCC)    Pneumonia    Pulmonary fibrosis (HCC)    Raynaud's disease without gangrene    Restless leg syndrome    a.) on pramipexole + oral Fe supplementation  Rheumatoid arthritis (HCC)    a.) Tx'd with hydroxychloroquine   Right renal mass 05/28/2023   a.) CT renal 05/28/2023:  5.7 cm mass in the medial aspect of right kidney   Seasonal allergies    Thoracic compression fracture (HCC)     Assessment/Plan:   4 Days Post-Op Procedure(s) (LRB): Right revision reverse shoulder arthroplasty (conversion to longstem and glenosphere exchange) (Right) Principal Problem:   Periprosthetic fracture around internal prosthetic right shoulder joint Active Problems:   Hypertension   Seropositive rheumatoid arthritis (HCC)   Septic arthritis (HCC)   Acute on chronic anemia   (HFpEF) heart failure with preserved ejection fraction (HCC)   CKD (chronic kidney  disease) stage 4, GFR 15-29 ml/min (HCC)   ILD (interstitial lung disease) (HCC)   Tension pneumothorax   Cardiac arrest, cause unspecified (HCC)  Estimated body mass index is 31.55 kg/m as calculated from the following:   Height as of this encounter: 5\' 3"  (1.6 m).   Weight as of this encounter: 80.8 kg.  Vital signs are stable  Labs are stable, Hgb 7.4, WBC 14.6 and decreasing  Continue with IV abx, cultures pending.  Appreciate ID consult and following.  DVT Prophylaxis - Lovenox, TED hose, and SCDs    Dedra Skeens, PA-C [facility] 11/22/2023, 7:15 AM

## 2023-11-23 ENCOUNTER — Inpatient Hospital Stay: Payer: Medicare HMO

## 2023-11-23 ENCOUNTER — Encounter: Payer: Self-pay | Admitting: Orthopedic Surgery

## 2023-11-23 DIAGNOSIS — M059 Rheumatoid arthritis with rheumatoid factor, unspecified: Secondary | ICD-10-CM | POA: Diagnosis not present

## 2023-11-23 DIAGNOSIS — J9601 Acute respiratory failure with hypoxia: Secondary | ICD-10-CM | POA: Diagnosis not present

## 2023-11-23 DIAGNOSIS — M9731XA Periprosthetic fracture around internal prosthetic right shoulder joint, initial encounter: Secondary | ICD-10-CM | POA: Diagnosis not present

## 2023-11-23 DIAGNOSIS — I469 Cardiac arrest, cause unspecified: Secondary | ICD-10-CM | POA: Diagnosis not present

## 2023-11-23 LAB — CULTURE, BLOOD (ROUTINE X 2)
Culture: NO GROWTH
Culture: NO GROWTH
Special Requests: ADEQUATE
Special Requests: ADEQUATE

## 2023-11-23 LAB — GLUCOSE, CAPILLARY
Glucose-Capillary: 147 mg/dL — ABNORMAL HIGH (ref 70–99)
Glucose-Capillary: 123 mg/dL — ABNORMAL HIGH (ref 70–99)
Glucose-Capillary: 125 mg/dL — ABNORMAL HIGH (ref 70–99)
Glucose-Capillary: 125 mg/dL — ABNORMAL HIGH (ref 70–99)

## 2023-11-23 LAB — CBC
HCT: 22.8 % — ABNORMAL LOW (ref 36.0–46.0)
Hemoglobin: 7.2 g/dL — ABNORMAL LOW (ref 12.0–15.0)
MCH: 28.8 pg (ref 26.0–34.0)
MCHC: 31.6 g/dL (ref 30.0–36.0)
MCV: 91.2 fL (ref 80.0–100.0)
Platelets: 227 10*3/uL (ref 150–400)
RBC: 2.5 MIL/uL — ABNORMAL LOW (ref 3.87–5.11)
RDW: 16.4 % — ABNORMAL HIGH (ref 11.5–15.5)
WBC: 8.8 10*3/uL (ref 4.0–10.5)
nRBC: 0 % (ref 0.0–0.2)

## 2023-11-23 LAB — MAGNESIUM: Magnesium: 2 mg/dL (ref 1.7–2.4)

## 2023-11-23 LAB — BLOOD GAS, ARTERIAL
Acid-base deficit: 1.9 mmol/L (ref 0.0–2.0)
Bicarbonate: 24.3 mmol/L (ref 20.0–28.0)
FIO2: 0.5 %
Mode: POSITIVE
O2 Saturation: 98.5 %
PEEP: 8 cmH2O
Patient temperature: 37
Pressure support: 8 cmH2O
pCO2 arterial: 46 mm[Hg] (ref 32–48)
pH, Arterial: 7.33 — ABNORMAL LOW (ref 7.35–7.45)
pO2, Arterial: 143 mm[Hg] — ABNORMAL HIGH (ref 83–108)

## 2023-11-23 LAB — BASIC METABOLIC PANEL
Anion gap: 13 (ref 5–15)
BUN: 76 mg/dL — ABNORMAL HIGH (ref 8–23)
CO2: 22 mmol/L (ref 22–32)
Calcium: 8.6 mg/dL — ABNORMAL LOW (ref 8.9–10.3)
Chloride: 101 mmol/L (ref 98–111)
Creatinine, Ser: 2.88 mg/dL — ABNORMAL HIGH (ref 0.44–1.00)
GFR, Estimated: 17 mL/min — ABNORMAL LOW (ref >60)
Glucose, Bld: 155 mg/dL — ABNORMAL HIGH (ref 70–99)
Potassium: 3.9 mmol/L (ref 3.5–5.1)
Sodium: 136 mmol/L (ref 135–145)

## 2023-11-23 LAB — PHOSPHORUS: Phosphorus: 4.1 mg/dL (ref 2.5–4.6)

## 2023-11-23 LAB — HEPARIN LEVEL (UNFRACTIONATED)
Heparin Unfractionated: 0.13 [IU]/mL — ABNORMAL LOW (ref 0.30–0.70)
Heparin Unfractionated: 0.29 [IU]/mL — ABNORMAL LOW (ref 0.30–0.70)

## 2023-11-23 LAB — IRON AND TIBC
Iron: 12 ug/dL — ABNORMAL LOW (ref 28–170)
Saturation Ratios: 7 % — ABNORMAL LOW (ref 10.4–31.8)
TIBC: 174 ug/dL — ABNORMAL LOW (ref 250–450)
UIBC: 162 ug/dL

## 2023-11-23 LAB — CULTURE, RESPIRATORY W GRAM STAIN

## 2023-11-23 LAB — CK: Total CK: 35 U/L — ABNORMAL LOW (ref 38–234)

## 2023-11-23 MED ORDER — POTASSIUM CHLORIDE 20 MEQ PO PACK
40.0000 meq | PACK | Freq: Once | ORAL | Status: AC
  Administered 2023-11-23: 40 meq
  Filled 2023-11-23: qty 2

## 2023-11-23 MED ORDER — SODIUM CHLORIDE 0.9 % IV SOLN
2.0000 g | INTRAVENOUS | Status: DC
  Administered 2023-11-23 – 2023-12-01 (×9): 2 g via INTRAVENOUS
  Filled 2023-11-23 (×12): qty 20

## 2023-11-23 MED ORDER — HEPARIN BOLUS VIA INFUSION
1050.0000 [IU] | Freq: Once | INTRAVENOUS | Status: AC
  Administered 2023-11-23: 1050 [IU] via INTRAVENOUS
  Filled 2023-11-23: qty 1050

## 2023-11-23 MED ORDER — PANTOPRAZOLE SODIUM 40 MG IV SOLR
40.0000 mg | Freq: Every day | INTRAVENOUS | Status: DC
  Administered 2023-11-23 – 2023-11-25 (×3): 40 mg via INTRAVENOUS
  Filled 2023-11-23 (×3): qty 10

## 2023-11-23 MED ORDER — FENTANYL CITRATE PF 50 MCG/ML IJ SOSY
12.5000 ug | PREFILLED_SYRINGE | INTRAMUSCULAR | Status: DC | PRN
  Administered 2023-11-23 (×2): 25 ug via INTRAVENOUS
  Administered 2023-11-23: 12.5 ug via INTRAVENOUS
  Administered 2023-11-24: 25 ug via INTRAVENOUS
  Administered 2023-11-24: 12.5 ug via INTRAVENOUS
  Administered 2023-11-24 (×2): 25 ug via INTRAVENOUS
  Filled 2023-11-23 (×7): qty 1

## 2023-11-23 MED ORDER — HEPARIN BOLUS VIA INFUSION
2100.0000 [IU] | Freq: Once | INTRAVENOUS | Status: AC
  Administered 2023-11-23: 2100 [IU] via INTRAVENOUS
  Filled 2023-11-23: qty 2100

## 2023-11-23 MED ORDER — IPRATROPIUM-ALBUTEROL 0.5-2.5 (3) MG/3ML IN SOLN
3.0000 mL | Freq: Four times a day (QID) | RESPIRATORY_TRACT | Status: DC | PRN
  Administered 2023-11-25 – 2023-11-26 (×3): 3 mL via RESPIRATORY_TRACT
  Filled 2023-11-23 (×3): qty 3

## 2023-11-23 NOTE — Plan of Care (Signed)
  Problem: Nutrition: Goal: Adequate nutrition will be maintained Outcome: Progressing   Problem: Elimination: Goal: Will not experience complications related to urinary retention Outcome: Progressing   Problem: Health Behavior/Discharge Planning: Goal: Ability to manage health-related needs will improve Outcome: Not Progressing   Problem: Clinical Measurements: Goal: Ability to maintain clinical measurements within normal limits will improve Outcome: Not Progressing   Problem: Coping: Goal: Level of anxiety will decrease Outcome: Not Progressing

## 2023-11-23 NOTE — Progress Notes (Signed)
Subjective: 5 Days Post-Op Procedure(s) (LRB): Right revision reverse shoulder arthroplasty (conversion to longstem and glenosphere exchange) (Right) Patient in the ICU.  Patient is intubated, with chest tube.  Unable to extubate.  Status post cardiac arrest with tension pneumothorax.  Objective: Vital signs in last 24 hours: Temp:  [97.5 F (36.4 C)-99.4 F (37.4 C)] 99.4 F (37.4 C) (12/11 0000) Pulse Rate:  [40-150] 55 (12/11 0700) Resp:  [13-26] 18 (12/11 0700) BP: (81-148)/(33-111) 131/62 (12/11 0700) SpO2:  [94 %-100 %] 99 % (12/11 0700) FiO2 (%):  [30 %-40 %] 30 % (12/11 0430) Weight:  [80.4 kg] 80.4 kg (12/11 0500)  Intake/Output from previous day: 12/10 0701 - 12/11 0700 In: 2069.2 [I.V.:1210; NG/GT:608.3; IV Piggyback:250.8] Out: 1120 [Urine:940; Emesis/NG output:170; Chest Tube:10] Intake/Output this shift: No intake/output data recorded.  Recent Labs    11/20/23 2207 11/21/23 0402 11/22/23 0407 11/23/23 0544  HGB 7.6* 7.6* 7.4* 7.2*   Recent Labs    11/22/23 0407 11/23/23 0544  WBC 14.6* 8.8  RBC 2.52* 2.50*  HCT 23.4* 22.8*  PLT 230 227   Recent Labs    11/22/23 0407 11/23/23 0544  NA 140 136  K 3.9 3.9  CL 104 101  CO2 21* 22  BUN 72* 76*  CREATININE 2.96* 2.88*  GLUCOSE 96 155*  CALCIUM 8.7* 8.6*   Recent Labs    11/22/23 1247  INR 1.3*    EXAM General - Patient is intubated, nonresponsive Extremity - Intact pulses distally No cellulitis present Compartment soft Dressing intact and dry Dressing - dressing C/D/I and no drainage, shoulder immobilizer in place.  Pillow removed.     Past Medical History:  Diagnosis Date   Acute hypoxemic respiratory failure (HCC)    Anemia in stage 4 chronic kidney disease (HCC)    Anginal pain (HCC)    Anxiety    Aortic atherosclerosis (HCC)    CHF (congestive heart failure) (HCC)    a.) TTE 09/30/2021: EF 55-60%, no RWMAs, mild MR, G1DD   Chronic pain    Chronic radicular pain of lower  back    CKD (chronic kidney disease), stage IV (HCC)    Complication of anesthesia    a.) delayed emergence following ureteroscopy 07/2023   Coronary artery calcification seen on CT scan    Costochondritis    DDD (degenerative disc disease), lumbar    Depression    Dyspnea    GERD (gastroesophageal reflux disease)    Hiatal hernia    Hip dysplasia    Hyperlipidemia    Hypertension    Insomnia    Iron deficiency    Iron deficiency anemia    Low back pain    Low vitamin B12 level    Lumbar facet joint pain    Migraines    Nose colonized with MRSA 10/03/2020   a.) preop PCR (+) 10/03/2020 prior to RIGHT TKA; b.) preop PCR (+) 08/25/2023 prior to RIGHT REVERSE SHOULDER ARTHROPLASTY; BICEPS TENODESIS   Numbness and tingling of right leg    OA (osteoarthritis)    Obesity    OSA (obstructive sleep apnea)    a.) not currently utilizing nocturnal PAP therapy; positional. PCCM feels as if symptom can be mitigated with diet/exercise/weight loss unless develops significant symptoms.   Osteoporosis    a.) recieves denosumab injections   Pelvic fracture (HCC)    Pneumonia    Pulmonary fibrosis (HCC)    Raynaud's disease without gangrene    Restless leg syndrome  a.) on pramipexole + oral Fe supplementation   Rheumatoid arthritis (HCC)    a.) Tx'd with hydroxychloroquine   Right renal mass 05/28/2023   a.) CT renal 05/28/2023:  5.7 cm mass in the medial aspect of right kidney   Seasonal allergies    Thoracic compression fracture (HCC)     Assessment/Plan:   5 Days Post-Op Procedure(s) (LRB): Right revision reverse shoulder arthroplasty (conversion to longstem and glenosphere exchange) (Right) Principal Problem:   Periprosthetic fracture around internal prosthetic right shoulder joint Active Problems:   Hypertension   Seropositive rheumatoid arthritis (HCC)   Septic arthritis (HCC)   Acute on chronic anemia   (HFpEF) heart failure with preserved ejection fraction (HCC)   CKD  (chronic kidney disease) stage 4, GFR 15-29 ml/min (HCC)   ILD (interstitial lung disease) (HCC)   Tension pneumothorax   Cardiac arrest, cause unspecified (HCC)  Estimated body mass index is 31.4 kg/m as calculated from the following:   Height as of this encounter: 5\' 3"  (1.6 m).   Weight as of this encounter: 80.4 kg.  Vital signs are stable  Labs are stable, Hgb 7.2, WBC 8.8 and decreasing  Continue with IV abx, cultures pending.  Appreciate ID consult and following.  DVT Prophylaxis - Lovenox, TED hose, and SCDs    Dedra Skeens, PA-C [facility] 11/23/2023, 7:24 AM

## 2023-11-23 NOTE — Progress Notes (Signed)
PHARMACY CONSULT NOTE  Pharmacy Consult for Electrolyte Monitoring and Replacement   Recent Labs: Potassium (mmol/L)  Date Value  11/23/2023 3.9   Magnesium (mg/dL)  Date Value  16/09/9603 2.0   Calcium (mg/dL)  Date Value  54/08/8118 8.6 (L)   Albumin (g/dL)  Date Value  14/78/2956 2.1 (L)   Phosphorus (mg/dL)  Date Value  21/30/8657 4.1   Sodium (mmol/L)  Date Value  11/23/2023 136  09/12/2023 142   Assessment: 72 y.o. female with medical history significant of CKD Stage 4, HFpEF, HTN, B12 deficiency, anemia of CKD, rheumatoid arthritis, Raynaud's phenomenon, OSA not on CPAP, who was admitted on 12/6 for recurrent R shoulder revision following arthroplasty.   Diuretics: Intermittent IV Lasix  Goal of Therapy:  K >= 4, Mg >= 2  Plan:  --K 3.9, Kcl 40 mEq per tube x 1 dose --Labs tomorrow  Tonya Cummings 11/23/2023 8:13 AM

## 2023-11-23 NOTE — Progress Notes (Signed)
NAME:  Tonya Cummings, MRN:  [id number], DOB:  Jun 16, 1951, LOS: 5 ADMISSION DATE:  11/18/2023, CHIEF COMPLAINT:  Septic Joint  History of Present Illness:   This is a case of a 72 year old female patient with a past medical history of CKD stage IV, heart failure with preserved EF, hypertension, rheumatoid arthritis, OSA not on CPAP presents to [facility] on 12/06 for right reverse shoulder arthroplasty after for suspicion of septic joint.  She initially underwent right shoulder arthroplasty on 08/30/2023 after which she developed a.  Periprosthetic fracture status post conversion to a longstem humeral component with ORIF on 10/03/2023.  No evidence of infection at the time.  Follow-up lab work showed elevated inflammatory markers including ESR suspicious for an infectious process.  Therefore she presents back on 12/06 for shoulder arthroplasty explant of glenoid and humeral component open treatment of right humerus fracture with intramedullary device and insertion of antibiotic delivery system.  Small area of purulence was noted.  Cultures were sent.  Started on daptomycin and ceftriaxone.  She did well immediately postop however on 12/07 she was getting up to the bedside commode she went into respiratory distress with O2 sat dropping becoming apneic and went into PEA arrest.  CPR for roughly 5 to 6-minute prior to ROSC epi x 2.  Not shockable rhythm.  Patient intubated and transferred to the ICU for further care.  Postintubation chest x-ray with right pneumothorax status post chest tube placement 12/07.  HD catheter placed 12/07 anticipating need for dialysis given worsening kidney function.  Echocardiogram 12/08 with normal LVEF 55 to 60%.  RV systolic function is normal and size is normal.  Ultrasound venous lower extremity 12/08 negative for DVT.  Pertinent  Medical History  RA on Hydoxychloroquine HTN HFpEF  Micro Data:  Blood x2 12/6: negative  Right humerus  wound 12/6: NGTD Right tuberosity 12/6: NGTD Right glenosphere 12/6: NGTD  MRSA PCR 12/7: negative  Resp 12/9: abundant wbc present, predominantly pmn, rare gram positive cocci in pairs, rare gram negative rods   Anti-infectives (From admission, onward)    Start     Dose/Rate Route Frequency Ordered Stop   11/22/23 1400  DAPTOmycin (CUBICIN) IVPB 700 mg/165mL premix        700 mg 200 mL/hr over 30 Minutes Intravenous Every 48 hours 11/21/23 1609     11/22/23 1100  ceFEPIme (MAXIPIME) 2 g in sodium chloride 0.9 % 100 mL IVPB        2 g 200 mL/hr over 30 Minutes Intravenous Every 24 hours 11/22/23 1009     11/19/23 2245  hydroxychloroquine (PLAQUENIL) tablet 200 mg  Status:  Discontinued        200 mg Per Tube 2 times daily 11/19/23 2152 11/20/23 1041   11/19/23 1800  DAPTOmycin (CUBICIN) IVPB 500 mg/64mL premix  Status:  Discontinued        6 mg/kg  77.1 kg 100 mL/hr over 30 Minutes Intravenous Every 48 hours 11/18/23 1935 11/18/23 1943   11/18/23 2200  hydroxychloroquine (PLAQUENIL) tablet 200 mg  Status:  Discontinued        200 mg Oral 2 times daily 11/18/23 2003 11/19/23 2152   11/18/23 2100  ceFAZolin (ANCEF) IVPB 2g/100 mL premix  Status:  Discontinued        2 g 200 mL/hr over 30 Minutes Intravenous Every 6 hours 11/18/23 2003 11/18/23 2021   11/18/23 2000  DAPTOmycin (CUBICIN) IVPB 500 mg/72mL premix  Status:  Discontinued  6 mg/kg  77.1 kg 100 mL/hr over 30 Minutes Intravenous Every 48 hours 11/18/23 1906 11/18/23 1935   11/18/23 2000  DAPTOmycin (CUBICIN) IVPB 500 mg/82mL premix  Status:  Discontinued        6 mg/kg  77.1 kg 100 mL/hr over 30 Minutes Intravenous Every 48 hours 11/18/23 1943 11/21/23 1609   11/18/23 1930  cefTRIAXone (ROCEPHIN) 2 g in sodium chloride 0.9 % 100 mL IVPB  Status:  Discontinued        2 g 200 mL/hr over 30 Minutes Intravenous Every 24 hours 11/18/23 1832 11/22/23 1009   11/18/23 1602  gentamicin (GARAMYCIN) injection  Status:   Discontinued          As needed 11/18/23 1632 11/18/23 1705   11/18/23 1600  tobramycin (NEBCIN) powder  Status:  Discontinued          As needed 11/18/23 1631 11/18/23 1705   11/18/23 1528  vancomycin (VANCOCIN) powder  Status:  Discontinued          As needed 11/18/23 1628 11/18/23 1705   11/18/23 0600  ceFAZolin (ANCEF) IVPB 2g/100 mL premix        2 g 200 mL/hr over 30 Minutes Intravenous On call to O.R. 11/17/23 2319 11/18/23 1527      Significant Hospital Events: Including procedures, antibiotic start and stop dates in addition to other pertinent events   12/06 admitted for arthroplasty revision explant and insertion of antibiotic device. 12/7 PEA arrest intubated and admitted to the ICU.  Chest x-ray with right pneumothorax status post chest tube placement 12/07.  Status post HD catheter placement 12/07 with worsening kidney function. 12/08 remains intubated and sedated.  12/09 failed SBT due to hypoxia, tolerated PSV during the day 12/10 failed SBT due to low tidal volumes, hypoxia 12/11 Pt remains mechanically intubated on minimal vent settings; performed SBT unable to tolerate PS 5/5 due to hypoxia and low tidal volumes; currently tolerating PS 12/5  Interim History / Subjective:  As outlined above under significant events   Objective   Blood pressure 131/62, pulse (!) 55, temperature 99.4 F (37.4 C), temperature source Axillary, resp. rate 18, height 5\' 3"  (1.6 m), weight 80.4 kg, SpO2 94%.    Vent Mode: PSV FiO2 (%):  [30 %-40 %] 40 % Set Rate:  [18 bmp] 18 bmp Vt Set:  [500 mL] 500 mL PEEP:  [5 cmH20] 5 cmH20 Pressure Support:  [12 cmH20] 12 cmH20 Plateau Pressure:  [22 cmH20-29 cmH20] 29 cmH20   Intake/Output Summary (Last 24 hours) at 11/23/2023 0850 Last data filed at 11/23/2023 0631 Gross per 24 hour  Intake 2099.46 ml  Output 995 ml  Net 1104.46 ml   Filed Weights   11/21/23 0500 11/22/23 0459 11/23/23 0500  Weight: 78.6 kg 80.8 kg 80.4 kg     Examination: General: Acutely-ill appearing female, NAD mechanically intubated  HENT: Supple, no JVD  Lungs: Rhonchi throughout, even, non labored  Cardiovascular: NSR, s1s2, no r/g, 2+ radial/2+ distal pulses, no edema  Abdomen: +BS x4, soft, non tender, non distended  Extremities:RUE ROM limited due to recent surgical procedure  Skin: Right shoulder incision site with honeycomb dressing clear/dry/intact  Neuro: Awake, following commands, PERRL  GU: Indwelling foley catheter draining yellow urine   Assessment & Plan:   72 year old female presented on 12/06 for right reverse shoulder arthroplasty due to suspected septic joint complicated by post operative cardiac arrest (PEA).  #Toxic Metabolic Encephalopathy #Mechanical intubation pain/discomfort #Postop pain  Hx: RLS  - Maintain a RASS goal of 0 to -1 - PAD protocol to maintain RASS goal: precedex gtt and prn fentanyl  - Avoid sedating medications as able - Daily wake up assessment - Continue scheduled oxycodone 10 mg q6hrs for pain management  - Continue outpatient pramipexole   #PEA Arrest #NSTEMI #Circulatory Shock #Afib (new onset) Echo 11/20/23: EF 55 to 60%; trivial mitral valve regurgitation - Continuous telemetry monitoring  - Continue amiodarone and heparin gtts  - Continue aspirin   #Acute Hypoxic Respiratory Failure #ARDS #Right traumatic pneumothorax #Possible aspiration pneumonia #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 - Full vent support for now, implement lung protective strategies - Plateau pressures less than 30 cm H20 - Wean FiO2 & PEEP as tolerated to maintain O2 sats >92% - Will clamp Chest tube today and repeat CXR later today  - SBT daily  - VAP bundle implemented  - Prn bronchodilators  #Anemia in stage 4 CKD #Iron deficiency anemia  - Trend CBC  - Monitor for s/sx of bleeding - Iron and TIBC results pending  - Transfuse for hgb less than 7 - Continue outpatient ferrous sulfate    #Gastrointestinal - Continues tube feeds, famotidine for SUP  #AKI superimposed on stage 4 CKD - Trend BMP  - Replace electrolytes as indicated  - Strict intake/output  - Avoid nephrotoxic medications   #R. Reverse shoulder arthroplasty (9/17) #Revision with converstion to long stemp humeroal component for periprosthetic fracture (10/21) #Right reverse shoulder arthroplasty explant of glenoid and humeral components for infection (12/6) #HAP - Trend WBC and monitor fever curve  - Trend PCT  - Follow cultures  - ID consulted appreciate input - Continue abx as outlined above pending culture results and sensitivities  - Orthopedic consulted appreciate input   Endocrine - CBG's q4hrs  - Follow hyper/hypoglycemic protocol  - Target range 140 to 180  Best Practice (right click and "Reselect all SmartList Selections" daily)   Diet/type: tubefeeds DVT prophylaxis systemic heparin Pressure ulcer(s): N/A GI prophylaxis: H2B Lines: Central line and yes and it is still needed Foley:  Yes, and it is still needed Code Status:  full code Last date of multidisciplinary goals of care discussion [11/23/2023]  12/11: Updated pts husband at bedside regarding pts condition and current plan of care  Labs   CBC: Recent Labs  Lab 11/19/23 0216 11/19/23 1310 11/19/23 2039 11/20/23 0252 11/20/23 2207 11/21/23 0402 11/22/23 0407 11/23/23 0544  WBC 8.6 14.8* 27.5* 22.6*  --  15.3* 14.6* 8.8  NEUTROABS 7.1 9.3*  --   --   --   --   --   --   HGB 7.1* 8.7* 8.0* 7.6* 7.6* 7.6* 7.4* 7.2*  HCT 23.4* 29.1* 25.8* 24.6* 23.9* 23.8* 23.4* 22.8*  MCV 92.9 96.7 92.1 91.1  --  89.5 92.9 91.2  PLT 304 334 327 275  --  251 230 227    Basic Metabolic Panel: Recent Labs  Lab 11/19/23 2039 11/20/23 0252 11/20/23 2207 11/21/23 0402 11/22/23 0407 11/23/23 0544  NA 140 138 137 137 140 136  K 5.2* 5.1 3.9 3.7 3.9 3.9  CL 106 104 104 104 104 101  CO2 24 24 23 25  21* 22  GLUCOSE 121* 103* 112*  107* 96 155*  BUN 71* 73* 67* 67* 72* 76*  CREATININE 2.83* 2.80* 2.79* 2.66* 2.96* 2.88*  CALCIUM 8.9 8.8* 8.5* 8.6* 8.7* 8.6*  MG 2.2 2.1  --  2.0 2.0 2.0  PHOS 5.8* 4.9*  --  4.0 5.2* 4.1   GFR: Estimated Creatinine Clearance: 18.2 mL/min (A) (by C-G formula based on SCr of 2.88 mg/dL (H)). Recent Labs  Lab 11/19/23 1311 11/19/23 1455 11/19/23 2039 11/20/23 0252 11/21/23 0402 11/22/23 0407 11/23/23 0544  WBC  --   --  27.5* 22.6* 15.3* 14.6* 8.8  LATICACIDVEN 7.3* 2.2* 1.5  --   --   --   --     Liver Function Tests: Recent Labs  Lab 11/19/23 1310 11/19/23 2039 11/20/23 0252 11/21/23 0402 11/22/23 0407  AST 96* 122* 102* 51* 36  ALT 55* 54* 41 20 16  ALKPHOS 135* 124 115 120 152*  BILITOT 0.9 0.6 0.7 0.8 0.9  PROT 6.9 6.7 6.3* 6.1* 6.0*  ALBUMIN 2.8* 2.6* 2.6* 2.4* 2.1*   No results for input(s): "LIPASE", "AMYLASE" in the last 168 hours. No results for input(s): "AMMONIA" in the last 168 hours.  ABG    Component Value Date/Time   PHART 7.33 (L) 11/21/2023 1742   PCO2ART 46 11/21/2023 1742   PO2ART 143 (H) 11/21/2023 1742   HCO3 24.3 11/21/2023 1742   ACIDBASEDEF 1.9 11/21/2023 1742   O2SAT 98.5 11/21/2023 1742     Coagulation Profile: Recent Labs  Lab 11/19/23 1310 11/22/23 1247  INR 1.4* 1.3*    Cardiac Enzymes: Recent Labs  Lab 11/19/23 0216 11/19/23 1310 11/19/23 2039 11/23/23 0544  CKTOTAL 108 134 291* 35*    HbA1C: No results found for: "HGBA1C"  CBG: Recent Labs  Lab 11/21/23 2326 11/22/23 1524 11/22/23 1956 11/22/23 2319 11/23/23 0802  GLUCAP 84 97 118* 121* 147*    Past Medical History:  She,  has a past medical history of Acute hypoxemic respiratory failure (HCC), Anemia in stage 4 chronic kidney disease (HCC), Anginal pain (HCC), Anxiety, Aortic atherosclerosis (HCC), CHF (congestive heart failure) (HCC), Chronic pain, Chronic radicular pain of lower back, CKD (chronic kidney disease), stage IV (HCC), Complication of  anesthesia, Coronary artery calcification seen on CT scan, Costochondritis, DDD (degenerative disc disease), lumbar, Depression, Dyspnea, GERD (gastroesophageal reflux disease), Hiatal hernia, Hip dysplasia, Hyperlipidemia, Hypertension, Insomnia, Iron deficiency, Iron deficiency anemia, Low back pain, Low vitamin B12 level, Lumbar facet joint pain, Migraines, Nose colonized with MRSA (10/03/2020), Numbness and tingling of right leg, OA (osteoarthritis), Obesity, OSA (obstructive sleep apnea), Osteoporosis, Pelvic fracture (HCC), Pneumonia, Pulmonary fibrosis (HCC), Raynaud's disease without gangrene, Restless leg syndrome, Rheumatoid arthritis (HCC), Right renal mass (05/28/2023), Seasonal allergies, and Thoracic compression fracture (HCC).   Surgical History:   Past Surgical History:  Procedure Laterality Date   ABDOMINAL SURGERY     pt denies   APPENDECTOMY     CARPAL TUNNEL RELEASE Bilateral    CHOLECYSTECTOMY     COLONOSCOPY WITH PROPOFOL N/A 08/15/2020   Procedure: COLONOSCOPY WITH PROPOFOL;  Surgeon: Earline Mayotte, MD;  Location: [facility];  Service: Endoscopy;  Laterality: N/A;   CYSTOSCOPY W/ RETROGRADES Right 07/18/2023   Procedure: CYSTOSCOPY WITH RETROGRADE PYELOGRAM;  Surgeon: [name], MD;  Location: [facility];  Service: Urology;  Laterality: Right;   DILATION AND CURETTAGE OF UTERUS     ESOPHAGOGASTRODUODENOSCOPY (EGD) WITH PROPOFOL N/A 08/15/2020   Procedure: ESOPHAGOGASTRODUODENOSCOPY (EGD) WITH PROPOFOL;  Surgeon: Earline Mayotte, MD;  Location: [facility];  Service: Endoscopy;  Laterality: N/A;   FRACTURE SURGERY     hip fracture    FRACTURE SURGERY     pelvic fracture plate    HIP SURGERY Left    KNEE ARTHROPLASTY Right 10/13/2020   Procedure: COMPUTER  ASSISTED TOTAL KNEE ARTHROPLASTY - RNFA;  Surgeon: Donato Heinz, MD;  Location: [facility];  Service: Orthopedics;  Laterality: Right;   REVERSE SHOULDER ARTHROPLASTY Right 08/30/2023   Procedure: Right  reverse shoulder arthroplasty, biceps tenodesis;  Surgeon: Signa Kell, MD;  Location: [facility];  Service: Orthopedics;  Laterality: Right;   TOTAL SHOULDER REVISION Right 10/03/2023   Procedure: Revision right reverse shoulder arthroplasty with conversion to long humeral stem and open reduction internal fixation of the humerus;  Surgeon: Signa Kell, MD;  Location: [facility];  Service: Orthopedics;  Laterality: Right;   TUBAL LIGATION     URETEROSCOPY  07/18/2023   Procedure: DIAGNOSTIC URETEROSCOPY;  Surgeon: [name], MD;  Location: [facility];  Service: Urology;;     Social History:   reports that she has never smoked. She has never used smokeless tobacco. She reports that she does not drink alcohol and does not use drugs.   Family History:  Her family history includes Aneurysm in her father; Cancer in her brother; Diabetes in her brother, maternal grandfather, mother, and son; Heart disease in her maternal grandfather; Hypertension in her mother; Osteosarcoma in her brother; Seizures in her son.   Allergies Allergies  Allergen Reactions   Gabapentin Other (See Comments)    Weight gain   Ibuprofen Other (See Comments)    Headache     Home Medications  Prior to Admission medications   Medication Sig Start Date End Date Taking? Authorizing Provider  acetaminophen (TYLENOL) 500 MG tablet Take 2 tablets (1,000 mg total) by mouth every 6 (six) hours as needed. 08/31/23  Yes Anson Oregon, PA-C  aspirin EC 325 MG tablet Take 1 tablet (325 mg total) by mouth daily. 08/31/23  Yes Anson Oregon, PA-C  calcitRIOL (ROCALTROL) 0.25 MCG capsule Take 0.25 mcg by mouth every morning. 05/22/21  Yes [provider]  Calcium Carb-Cholecalciferol 600-400 MG-UNIT CAPS Take 1 capsule by mouth 3 (three) times daily. 01/13/10  Yes [provider]  chlorhexidine (HIBICLENS) 4 % external liquid Apply 15 mLs (1 Application total) topically as directed for 30 doses. Use as  directed daily for 5 days every other week for 6 weeks. 11/18/23  Yes Signa Kell, MD  Cholecalciferol 25 MCG (1000 UT) tablet Take 1,000 Units by mouth every morning. 09/23/09  Yes [provider]  cyanocobalamin 1000 MCG tablet Take 1,000 mcg by mouth every morning.   Yes [provider]  cyclobenzaprine (FLEXERIL) 10 MG tablet Take 10 mg by mouth at bedtime as needed for muscle spasms (Leg cramps).   Yes [provider]  enalapril-hydrochlorothiazide (VASERETIC) 10-25 MG tablet Take 1 tablet by mouth at bedtime. 09/12/23  Yes Hammock, Lavonna Rua, NP  ferrous sulfate 325 (65 FE) MG tablet Take 325 mg by mouth daily with breakfast.   Yes [provider]  furosemide (LASIX) 40 MG tablet Take 1.5 tablets (60 mg total) by mouth every morning. Increase to 1.5 tablet (60 mg total) by mouth in morning and extra 1 tablet (40 mg total for maximum daily dose 100 mg) at lunch time as needed for up to 3 days for increased leg swelling, shortness of breath, weight gain 5+ lbs over 1-2 days. Seek medical care if these symptoms are not improving with increased dose. 09/02/23  Yes Sunnie Nielsen, DO  hydroxychloroquine (PLAQUENIL) 200 MG tablet Take 200 mg by mouth 2 (two) times daily. 06/22/21  Yes [provider]  mirtazapine (REMERON) 30 MG tablet Take 30 mg by mouth  at bedtime. 06/29/22  Yes [provider]  Multiple Vitamins-Minerals (MULTIVITAMIN ADULT PO) Take 1 tablet by mouth every morning. 09/10/08  Yes [provider]  mupirocin ointment (BACTROBAN) 2 % Apply small about inside of both nostrils TWICE a day for the next 5 days. 08/25/23  Yes Verlee Monte, NP  mupirocin ointment (BACTROBAN) 2 % Place 1 Application into the nose 2 (two) times daily for 60 doses. Use as directed 2 times daily for 5 days every other week for 6 weeks. 11/18/23 12/18/23 Yes Signa Kell, MD  omeprazole (PRILOSEC) 40 MG capsule Take 40 mg by mouth every morning.   Yes [provider]  ondansetron (ZOFRAN) 4 MG tablet Take 1 tablet (4 mg total) by mouth every 6 (six) hours as needed for nausea. 08/31/23  Yes Anson Oregon, PA-C  oxyCODONE (OXY IR/ROXICODONE) 5 MG immediate release tablet Take 1 tablet (5 mg total) by mouth every 4 (four) hours as needed for moderate pain (pain score 4-6) (pain score 4-6). 10/04/23  Yes Dedra Skeens, PA-C  Pirfenidone 267 MG TABS Take 801 tablets by mouth 3 (three) times daily. 12/24/22  Yes [provider]  pramipexole (MIRAPEX) 0.125 MG tablet Take 0.125-0.25 mg by mouth See admin instructions. Take 0.125 mg in the morning and 0.25 mg  at bedtime 02/15/18  Yes [provider]  PROLIA 60 MG/ML SOSY injection Inject 60 mg into the skin every 6 (six) months. 04/06/23  Yes [provider]  rosuvastatin (CRESTOR) 5 MG tablet Take 5 mg by mouth every other day. 07/18/19  Yes [provider]  spironolactone (ALDACTONE) 25 MG tablet Take 25 mg by mouth at bedtime. 04/09/21  Yes [provider]  tetrahydrozoline 0.05 % ophthalmic solution Place 1 drop into both eyes daily as needed (allergies).   Yes [provider]  venlafaxine XR (EFFEXOR-XR) 150 MG 24 hr capsule Take 150 mg by mouth See admin instructions. Take with 75 mg for total 225 mg in the morning 12/28/22 12/28/23 Yes [provider]  venlafaxine XR (EFFEXOR-XR) 75 MG 24 hr capsule Take 75 mg by mouth See admin instructions. Take with 150 mg for a total of 225 mg in the morning 08/24/23  Yes [provider]  chlorhexidine (HIBICLENS) 4 % external liquid Apply 15 mLs (1 Application total) topically as directed for 30 doses. Use as directed daily for 5 days every other week for 6 weeks. Patient not taking: Reported on 11/17/2023 10/03/23   Signa Kell, MD     Critical care time: 50 minutes    Zada Girt, AGNP  Pulmonary/Critical Care Pager [phone] (please enter 7 digits) PCCM Consult Pager [phone]  (please enter 7 digits)

## 2023-11-23 NOTE — Plan of Care (Signed)
The patient tolerated SBT/ pressure support on ventilator for a total of 5 hours today. Tolerated well. Plan to SBT again tomorrow.  Problem: Clinical Measurements: Goal: Respiratory complications will improve Outcome: Progressing   Problem: Activity: Goal: Risk for activity intolerance will decrease Outcome: Progressing   Problem: Nutrition: Goal: Adequate nutrition will be maintained Outcome: Progressing

## 2023-11-23 NOTE — Consult Note (Signed)
PHARMACY - ANTICOAGULATION CONSULT NOTE  Pharmacy Consult for IV Heparin Indication: atrial fibrillation  Patient Measurements: Height: 5\' 3"  (160 cm) Weight: 80.4 kg (177 lb 4 oz) IBW/kg (Calculated) : 52.4 Heparin Dosing Weight: 70 kg  Labs: Recent Labs    11/20/23 2207 11/21/23 0402 11/22/23 0407 11/22/23 1247 11/22/23 2029 11/23/23 0544  HGB 7.6* 7.6* 7.4*  --   --  7.2*  HCT 23.9* 23.8* 23.4*  --   --  22.8*  PLT  --  251 230  --   --  227  APTT  --   --   --  45*  --   --   LABPROT  --   --   --  16.8*  --   --   INR  --   --   --  1.3*  --   --   HEPARINUNFRC  --   --   --   --  <0.10* 0.13*  CREATININE 2.79* 2.66* 2.96*  --   --  2.88*  CKTOTAL  --   --   --   --   --  35*  TROPONINIHS 293*  --   --   --   --   --    Estimated Creatinine Clearance: 18.2 mL/min (A) (by C-G formula based on SCr of 2.88 mg/dL (H)).  Medical History: Past Medical History:  Diagnosis Date   Acute hypoxemic respiratory failure (HCC)    Anemia in stage 4 chronic kidney disease (HCC)    Anginal pain (HCC)    Anxiety    Aortic atherosclerosis (HCC)    CHF (congestive heart failure) (HCC)    a.) TTE 09/30/2021: EF 55-60%, no RWMAs, mild MR, G1DD   Chronic pain    Chronic radicular pain of lower back    CKD (chronic kidney disease), stage IV (HCC)    Complication of anesthesia    a.) delayed emergence following ureteroscopy 07/2023   Coronary artery calcification seen on CT scan    Costochondritis    DDD (degenerative disc disease), lumbar    Depression    Dyspnea    GERD (gastroesophageal reflux disease)    Hiatal hernia    Hip dysplasia    Hyperlipidemia    Hypertension    Insomnia    Iron deficiency    Iron deficiency anemia    Low back pain    Low vitamin B12 level    Lumbar facet joint pain    Migraines    Nose colonized with MRSA 10/03/2020   a.) preop PCR (+) 10/03/2020 prior to RIGHT TKA; b.) preop PCR (+) 08/25/2023 prior to RIGHT REVERSE SHOULDER ARTHROPLASTY;  BICEPS TENODESIS   Numbness and tingling of right leg    OA (osteoarthritis)    Obesity    OSA (obstructive sleep apnea)    a.) not currently utilizing nocturnal PAP therapy; positional. PCCM feels as if symptom can be mitigated with diet/exercise/weight loss unless develops significant symptoms.   Osteoporosis    a.) recieves denosumab injections   Pelvic fracture (HCC)    Pneumonia    Pulmonary fibrosis (HCC)    Raynaud's disease without gangrene    Restless leg syndrome    a.) on pramipexole + oral Fe supplementation   Rheumatoid arthritis (HCC)    a.) Tx'd with hydroxychloroquine   Right renal mass 05/28/2023   a.) CT renal 05/28/2023:  5.7 cm mass in the medial aspect of right kidney   Seasonal allergies    Thoracic  compression fracture (HCC)    Medications:  No anticoagulation prior to admission per my chart review  Assessment: 72 y/o F with a past medical history of CKD IV, HFpEF, hypertension, RA, OSA not on CPAP who came to [facility] 12/6 for right reverse shoulder arthroplasty after for suspicion of septic joint. Patient experienced cardiac arrest post-operatively on 12/7. She is intubated, sedated and on mechanical ventilation in the ICU. Hospital course now further complicated by paroxysmal Afib with RVR. Patient has been placed on amiodarone. Pharmacy consulted for heparin.  Baseline aPTT and PT-INR are pending. CBC is notable for anemia which is chronic.  12/10 20:29 HL <0.10 12/11 0544 HL 0.13, subtherapeutic  Goal of Therapy:  Heparin level 0.3-0.7 units/ml Monitor platelets by anticoagulation protocol: Yes   Plan:  --Heparin bolus 2100 units --Increase heparin to 1400 units/hr --HL 8 hours from rate change --Daily CBC per protocol while on IV heparin  Otelia Sergeant, PharmD, [facility] 11/23/2023 6:37 AM

## 2023-11-23 NOTE — Consult Note (Signed)
PHARMACY - ANTICOAGULATION CONSULT NOTE  Pharmacy Consult for IV Heparin Indication: atrial fibrillation  Patient Measurements: Height: 5\' 3"  (160 cm) Weight: 80.4 kg (177 lb 4 oz) IBW/kg (Calculated) : 52.4 Heparin Dosing Weight: 70 kg  Labs: Recent Labs    11/20/23 2207 11/21/23 0402 11/22/23 0407 11/22/23 1247 11/22/23 2029 11/23/23 0544 11/23/23 1539  HGB 7.6* 7.6* 7.4*  --   --  7.2*  --   HCT 23.9* 23.8* 23.4*  --   --  22.8*  --   PLT  --  251 230  --   --  227  --   APTT  --   --   --  45*  --   --   --   LABPROT  --   --   --  16.8*  --   --   --   INR  --   --   --  1.3*  --   --   --   HEPARINUNFRC  --   --   --   --  <0.10* 0.13* 0.29*  CREATININE 2.79* 2.66* 2.96*  --   --  2.88*  --   CKTOTAL  --   --   --   --   --  35*  --   TROPONINIHS 293*  --   --   --   --   --   --    Estimated Creatinine Clearance: 18.2 mL/min (A) (by C-G formula based on SCr of 2.88 mg/dL (H)).  Medical History: Past Medical History:  Diagnosis Date   Acute hypoxemic respiratory failure (HCC)    Anemia in stage 4 chronic kidney disease (HCC)    Anginal pain (HCC)    Anxiety    Aortic atherosclerosis (HCC)    CHF (congestive heart failure) (HCC)    a.) TTE 09/30/2021: EF 55-60%, no RWMAs, mild MR, G1DD   Chronic pain    Chronic radicular pain of lower back    CKD (chronic kidney disease), stage IV (HCC)    Complication of anesthesia    a.) delayed emergence following ureteroscopy 07/2023   Coronary artery calcification seen on CT scan    Costochondritis    DDD (degenerative disc disease), lumbar    Depression    Dyspnea    GERD (gastroesophageal reflux disease)    Hiatal hernia    Hip dysplasia    Hyperlipidemia    Hypertension    Insomnia    Iron deficiency    Iron deficiency anemia    Low back pain    Low vitamin B12 level    Lumbar facet joint pain    Migraines    Nose colonized with MRSA 10/03/2020   a.) preop PCR (+) 10/03/2020 prior to RIGHT TKA; b.) preop  PCR (+) 08/25/2023 prior to RIGHT REVERSE SHOULDER ARTHROPLASTY; BICEPS TENODESIS   Numbness and tingling of right leg    OA (osteoarthritis)    Obesity    OSA (obstructive sleep apnea)    a.) not currently utilizing nocturnal PAP therapy; positional. PCCM feels as if symptom can be mitigated with diet/exercise/weight loss unless develops significant symptoms.   Osteoporosis    a.) recieves denosumab injections   Pelvic fracture (HCC)    Pneumonia    Pulmonary fibrosis (HCC)    Raynaud's disease without gangrene    Restless leg syndrome    a.) on pramipexole + oral Fe supplementation   Rheumatoid arthritis (HCC)    a.) Tx'd with hydroxychloroquine  Right renal mass 05/28/2023   a.) CT renal 05/28/2023:  5.7 cm mass in the medial aspect of right kidney   Seasonal allergies    Thoracic compression fracture (HCC)    Medications:  No anticoagulation prior to admission per my chart review  Assessment: 72 y/o F with a past medical history of CKD IV, HFpEF, hypertension, RA, OSA not on CPAP who came to [facility] 12/6 for right reverse shoulder arthroplasty after for suspicion of septic joint. Patient experienced cardiac arrest post-operatively on 12/7. She is intubated, sedated and on mechanical ventilation in the ICU. Hospital course now further complicated by paroxysmal Afib with RVR. Patient has been placed on amiodarone. Pharmacy consulted for heparin.  Baseline aPTT and PT-INR are pending. CBC is notable for anemia which is chronic.  12/10 20:29 HL <0.10 12/11 0544 HL 0.13, subtherapeutic 12/11 1539 HL 0.29, subtherapeutic  Goal of Therapy:  Heparin level 0.3-0.7 units/ml Monitor platelets by anticoagulation protocol: Yes   Plan:  --Heparin bolus 1050 units --Increase heparin to 1550 units/hr --HL 8 hours from rate change --Daily CBC per protocol while on IV heparin  Tonya Cummings, PharmD Clinical Pharmacist 11/23/2023 4:37 PM

## 2023-11-23 NOTE — Procedures (Signed)
Chest Tube Removal:  Pigtail catheter sutures were cut and chest tube site cleaned with chloroprep. Chest tube was removed while the patient exhaled. Tube removed in one piece. Wound site was immediately occluded with Xeroform and sterile gauze, pressure held and then taped down with tegaderm. Patient tolerated removal well with no immediate complications.  Raechel Chute, MD [facility] 11/23/2023 6:27 PM

## 2023-11-24 ENCOUNTER — Inpatient Hospital Stay: Payer: Medicare HMO

## 2023-11-24 DIAGNOSIS — J9601 Acute respiratory failure with hypoxia: Secondary | ICD-10-CM | POA: Diagnosis not present

## 2023-11-24 DIAGNOSIS — M9731XD Periprosthetic fracture around internal prosthetic right shoulder joint, subsequent encounter: Secondary | ICD-10-CM

## 2023-11-24 DIAGNOSIS — I469 Cardiac arrest, cause unspecified: Secondary | ICD-10-CM | POA: Diagnosis not present

## 2023-11-24 DIAGNOSIS — D649 Anemia, unspecified: Secondary | ICD-10-CM | POA: Diagnosis not present

## 2023-11-24 LAB — BASIC METABOLIC PANEL
Anion gap: 8 (ref 5–15)
BUN: 84 mg/dL — ABNORMAL HIGH (ref 8–23)
CO2: 25 mmol/L (ref 22–32)
Calcium: 8.5 mg/dL — ABNORMAL LOW (ref 8.9–10.3)
Chloride: 104 mmol/L (ref 98–111)
Creatinine, Ser: 2.61 mg/dL — ABNORMAL HIGH (ref 0.44–1.00)
GFR, Estimated: 19 mL/min — ABNORMAL LOW (ref >60)
Glucose, Bld: 146 mg/dL — ABNORMAL HIGH (ref 70–99)
Potassium: 4.5 mmol/L (ref 3.5–5.1)
Sodium: 137 mmol/L (ref 135–145)

## 2023-11-24 LAB — CBC
HCT: 23.9 % — ABNORMAL LOW (ref 36.0–46.0)
Hemoglobin: 7.5 g/dL — ABNORMAL LOW (ref 12.0–15.0)
MCH: 28 pg (ref 26.0–34.0)
MCHC: 31.4 g/dL (ref 30.0–36.0)
MCV: 89.2 fL (ref 80.0–100.0)
Platelets: 234 10*3/uL (ref 150–400)
RBC: 2.68 MIL/uL — ABNORMAL LOW (ref 3.87–5.11)
RDW: 16.5 % — ABNORMAL HIGH (ref 11.5–15.5)
WBC: 8.8 10*3/uL (ref 4.0–10.5)
nRBC: 0.2 % (ref 0.0–0.2)

## 2023-11-24 LAB — GLUCOSE, CAPILLARY
Glucose-Capillary: 103 mg/dL — ABNORMAL HIGH (ref 70–99)
Glucose-Capillary: 110 mg/dL — ABNORMAL HIGH (ref 70–99)
Glucose-Capillary: 125 mg/dL — ABNORMAL HIGH (ref 70–99)
Glucose-Capillary: 128 mg/dL — ABNORMAL HIGH (ref 70–99)
Glucose-Capillary: 141 mg/dL — ABNORMAL HIGH (ref 70–99)
Glucose-Capillary: 96 mg/dL (ref 70–99)

## 2023-11-24 LAB — PHOSPHORUS: Phosphorus: 3.4 mg/dL (ref 2.5–4.6)

## 2023-11-24 LAB — HEPARIN LEVEL (UNFRACTIONATED)
Heparin Unfractionated: 0.34 [IU]/mL (ref 0.30–0.70)
Heparin Unfractionated: 0.28 [IU]/mL — ABNORMAL LOW (ref 0.30–0.70)
Heparin Unfractionated: 0.42 [IU]/mL (ref 0.30–0.70)

## 2023-11-24 LAB — MAGNESIUM: Magnesium: 2.1 mg/dL (ref 1.7–2.4)

## 2023-11-24 MED ORDER — FENTANYL CITRATE PF 50 MCG/ML IJ SOSY
50.0000 ug | PREFILLED_SYRINGE | Freq: Once | INTRAMUSCULAR | Status: AC
  Administered 2023-11-24: 50 ug via INTRAVENOUS
  Filled 2023-11-24: qty 1

## 2023-11-24 MED ORDER — ORAL CARE MOUTH RINSE: 15.0000 mL | OROMUCOSAL | Status: DC | PRN

## 2023-11-24 MED ORDER — FENTANYL CITRATE PF 50 MCG/ML IJ SOSY: 12.5000 ug | PREFILLED_SYRINGE | INTRAMUSCULAR | Status: DC | PRN

## 2023-11-24 MED ORDER — FENTANYL CITRATE PF 50 MCG/ML IJ SOSY
12.5000 ug | PREFILLED_SYRINGE | INTRAMUSCULAR | Status: DC | PRN
  Administered 2023-11-24 – 2023-11-25 (×4): 25 ug via INTRAVENOUS
  Filled 2023-11-24 (×4): qty 1

## 2023-11-24 MED ORDER — HEPARIN BOLUS VIA INFUSION
1000.0000 [IU] | Freq: Once | INTRAVENOUS | Status: AC
  Administered 2023-11-24: 1000 [IU] via INTRAVENOUS
  Filled 2023-11-24: qty 1000

## 2023-11-24 MED ORDER — ASPIRIN 325 MG PO TABS: 325.0000 mg | ORAL_TABLET | Freq: Every day | ORAL | Status: DC

## 2023-11-24 MED ORDER — PRAMIPEXOLE DIHYDROCHLORIDE 0.25 MG PO TABS
0.2500 mg | ORAL_TABLET | Freq: Every day | ORAL | Status: DC
  Filled 2023-11-24 (×3): qty 1

## 2023-11-24 MED ORDER — VITAMIN B-12 1000 MCG PO TABS: 1000.0000 ug | ORAL_TABLET | Freq: Every morning | ORAL | Status: DC

## 2023-11-24 MED ORDER — SODIUM CHLORIDE 3 % IN NEBU
4.0000 mL | INHALATION_SOLUTION | Freq: Two times a day (BID) | RESPIRATORY_TRACT | Status: AC
  Administered 2023-11-24 – 2023-11-26 (×6): 4 mL via RESPIRATORY_TRACT
  Filled 2023-11-24 (×6): qty 4

## 2023-11-24 MED ORDER — FERROUS SULFATE 220 (44 FE) MG/5ML PO SOLN
300.0000 mg | Freq: Every day | ORAL | Status: DC
  Filled 2023-11-24 (×2): qty 6.9

## 2023-11-24 MED ORDER — FENTANYL CITRATE PF 50 MCG/ML IJ SOSY
12.5000 ug | PREFILLED_SYRINGE | INTRAMUSCULAR | Status: DC | PRN
  Administered 2023-11-24: 12.5 ug via INTRAVENOUS
  Filled 2023-11-24: qty 1

## 2023-11-24 MED ORDER — POLYETHYLENE GLYCOL 3350 17 G PO PACK: 17.0000 g | PACK | Freq: Every day | ORAL | Status: DC

## 2023-11-24 MED ORDER — PRAMIPEXOLE DIHYDROCHLORIDE 0.25 MG PO TABS
0.1250 mg | ORAL_TABLET | Freq: Every morning | ORAL | Status: DC
  Administered 2023-11-26: 0.125 mg
  Filled 2023-11-24 (×2): qty 0.5

## 2023-11-24 MED ORDER — CALCITRIOL 0.25 MCG PO CAPS
0.2500 ug | ORAL_CAPSULE | Freq: Every morning | ORAL | Status: DC
  Filled 2023-11-24 (×2): qty 1

## 2023-11-24 MED ORDER — OYSTER SHELL CALCIUM/D3 500-5 MG-MCG PO TABS: 1.0000 | ORAL_TABLET | Freq: Three times a day (TID) | ORAL | Status: DC

## 2023-11-24 MED ORDER — ACETAMINOPHEN 10 MG/ML IV SOLN
1000.0000 mg | Freq: Four times a day (QID) | INTRAVENOUS | Status: AC
  Administered 2023-11-24 – 2023-11-25 (×4): 1000 mg via INTRAVENOUS
  Filled 2023-11-24 (×4): qty 100

## 2023-11-24 MED ORDER — OXYCODONE HCL 5 MG PO TABS
10.0000 mg | ORAL_TABLET | Freq: Four times a day (QID) | ORAL | Status: DC | PRN
  Administered 2023-11-26 (×2): 10 mg
  Filled 2023-11-24 (×2): qty 2

## 2023-11-24 MED ORDER — DOCUSATE SODIUM 50 MG/5ML PO LIQD: 100.0000 mg | Freq: Two times a day (BID) | ORAL | Status: DC

## 2023-11-24 MED ORDER — VITAMIN D 25 MCG (1000 UNIT) PO TABS: 1000.0000 [IU] | ORAL_TABLET | Freq: Every morning | ORAL | Status: DC

## 2023-11-24 NOTE — TOC Progression Note (Addendum)
Transition of Care [facility]) - Progression Note    Patient Details  Name: Tonya Cummings MRN: [id number] Date of Birth: 1951/06/18  Transition of [facility]) CM/SW Contact  Allena Katz, LCSW Phone Number: 11/24/2023, 3:31 PM  Clinical Narrative:   Pt still on vent. Chest tube removed yesterday/ TOC continuing to follow.    Expected Discharge Plan: OP Rehab Barriers to Discharge: Continued Medical Work up  Expected Discharge Plan and Services       Living arrangements for the past 2 months: Single Family Home                                       Social Determinants of Health (SDOH) Interventions SDOH Screenings   Food Insecurity: Patient Unable To Answer (11/20/2023)  Housing: Patient Unable To Answer (11/20/2023)  Transportation Needs: Patient Unable To Answer (11/20/2023)  Utilities: Patient Unable To Answer (11/20/2023)  Depression (PHQ2-9): Low Risk  (09/02/2022)  Tobacco Use: Low Risk  (11/18/2023)    Readmission Risk Interventions    11/19/2023   11:23 AM 10/04/2023   10:38 AM 09/02/2023   10:04 AM  Readmission Risk Prevention Plan  Transportation Screening Complete Complete Complete  PCP or Specialist Appt within 3-5 Days Complete Complete Complete  HRI or Home Care Consult Complete  Complete  Social Work Consult for Recovery Care Planning/Counseling Complete Complete Complete  Palliative Care Screening Not Applicable Not Applicable Not Applicable  Medication Review Oceanographer) Complete Complete Complete

## 2023-11-24 NOTE — Consult Note (Signed)
PHARMACY - ANTICOAGULATION CONSULT NOTE  Pharmacy Consult for IV Heparin Indication: atrial fibrillation  Patient Measurements: Height: 5\' 3"  (160 cm) Weight: 80.4 kg (177 lb 4 oz) IBW/kg (Calculated) : 52.4 Heparin Dosing Weight: 70 kg  Labs: Recent Labs    11/22/23 0407 11/22/23 1247 11/22/23 2029 11/23/23 0544 11/23/23 1539 11/24/23 0039 11/24/23 0448 11/24/23 0920 11/24/23 1817  HGB 7.4*  --   --  7.2*  --   --  7.5*  --   --   HCT 23.4*  --   --  22.8*  --   --  23.9*  --   --   PLT 230  --   --  227  --   --  234  --   --   APTT  --  45*  --   --   --   --   --   --   --   LABPROT  --  16.8*  --   --   --   --   --   --   --   INR  --  1.3*  --   --   --   --   --   --   --   HEPARINUNFRC  --   --    < > 0.13*   < > 0.34  --  0.28* 0.42  CREATININE 2.96*  --   --  2.88*  --   --  2.61*  --   --   CKTOTAL  --   --   --  35*  --   --   --   --   --    < > = values in this interval not displayed.   Estimated Creatinine Clearance: 20.1 mL/min (A) (by C-G formula based on SCr of 2.61 mg/dL (H)).  Medical History: Past Medical History:  Diagnosis Date   Acute hypoxemic respiratory failure (HCC)    Anemia in stage 4 chronic kidney disease (HCC)    Anginal pain (HCC)    Anxiety    Aortic atherosclerosis (HCC)    CHF (congestive heart failure) (HCC)    a.) TTE 09/30/2021: EF 55-60%, no RWMAs, mild MR, G1DD   Chronic pain    Chronic radicular pain of lower back    CKD (chronic kidney disease), stage IV (HCC)    Complication of anesthesia    a.) delayed emergence following ureteroscopy 07/2023   Coronary artery calcification seen on CT scan    Costochondritis    DDD (degenerative disc disease), lumbar    Depression    Dyspnea    GERD (gastroesophageal reflux disease)    Hiatal hernia    Hip dysplasia    Hyperlipidemia    Hypertension    Insomnia    Iron deficiency    Iron deficiency anemia    Low back pain    Low vitamin B12 level    Lumbar facet joint  pain    Migraines    Nose colonized with MRSA 10/03/2020   a.) preop PCR (+) 10/03/2020 prior to RIGHT TKA; b.) preop PCR (+) 08/25/2023 prior to RIGHT REVERSE SHOULDER ARTHROPLASTY; BICEPS TENODESIS   Numbness and tingling of right leg    OA (osteoarthritis)    Obesity    OSA (obstructive sleep apnea)    a.) not currently utilizing nocturnal PAP therapy; positional. PCCM feels as if symptom can be mitigated with diet/exercise/weight loss unless develops significant symptoms.   Osteoporosis    a.)  recieves denosumab injections   Pelvic fracture (HCC)    Pneumonia    Pulmonary fibrosis (HCC)    Raynaud's disease without gangrene    Restless leg syndrome    a.) on pramipexole + oral Fe supplementation   Rheumatoid arthritis (HCC)    a.) Tx'd with hydroxychloroquine   Right renal mass 05/28/2023   a.) CT renal 05/28/2023:  5.7 cm mass in the medial aspect of right kidney   Seasonal allergies    Thoracic compression fracture (HCC)    Medications:  No anticoagulation prior to admission per my chart review  Assessment: 72 y/o F with a past medical history of CKD IV, HFpEF, hypertension, RA, OSA not on CPAP who came to [facility] 12/6 for right reverse shoulder arthroplasty after for suspicion of septic joint. Patient experienced cardiac arrest post-operatively on 12/7. She is intubated, sedated and on mechanical ventilation in the ICU. Hospital course now further complicated by paroxysmal Afib with RVR. Patient has been placed on amiodarone. Pharmacy consulted for heparin.  Baseline aPTT and PT-INR are pending. CBC is notable for anemia which is chronic.  12/10 20:29 HL <0.10 12/11 0544 HL 0.13, subtherapeutic 12/11 1539 HL 0.29, subtherapeutic 12/12 0039 HL 0.34, therapeutic x 1 12/12 0920 HL 0.28, subtherapeutic 12/12 1817 HL 0.42, therapeutic x 1  Goal of Therapy:  Heparin level 0.3-0.7 units/ml Monitor platelets by anticoagulation protocol: Yes   Plan:  --Continue heparin infusion  at 1700 units/hr  --Check confirmatory HL in 8 hrs --Daily CBC per protocol while on IV heparin  Bettey Costa 11/24/2023 7:16 PM

## 2023-11-24 NOTE — Progress Notes (Addendum)
NAME:  Tonya Cummings, MRN:  [id number], DOB:  [birth year], LOS: 6 ADMISSION DATE:  11/18/2023, CHIEF COMPLAINT:  Septic Joint  History of Present Illness:   This is a case of a 72 year old female patient with a past medical history of CKD stage IV, heart failure with preserved EF, hypertension, rheumatoid arthritis, OSA not on CPAP presents to [facility] on 12/06 for right reverse shoulder arthroplasty after for suspicion of septic joint.  She initially underwent right shoulder arthroplasty on 08/30/2023 after which she developed a.  Periprosthetic fracture status post conversion to a longstem humeral component with ORIF on 10/03/2023.  No evidence of infection at the time.  Follow-up lab work showed elevated inflammatory markers including ESR suspicious for an infectious process.  Therefore she presents back on 12/06 for shoulder arthroplasty explant of glenoid and humeral component open treatment of right humerus fracture with intramedullary device and insertion of antibiotic delivery system.  Small area of purulence was noted.  Cultures were sent.  Started on daptomycin and ceftriaxone.  She did well immediately postop however on 12/07 she was getting up to the bedside commode she went into respiratory distress with O2 sat dropping becoming apneic and went into PEA arrest.  CPR for roughly 5 to 6-minute prior to ROSC epi x 2.  Not shockable rhythm.  Patient intubated and transferred to the ICU for further care.  Postintubation chest x-ray with right pneumothorax status post chest tube placement 12/07.  HD catheter placed 12/07 anticipating need for dialysis given worsening kidney function.  Echocardiogram 12/08 with normal LVEF 55 to 60%.  RV systolic function is normal and size is normal.  Ultrasound venous lower extremity 12/08 negative for DVT.  Pertinent  Medical History  RA on Hydoxychloroquine HTN HFpEF  Micro Data:  Surgical pcr screen 12/6: positive for  staph aureus  Blood x2 12/6: negative  Right humerus wound 12/6: NGTD Right tuberosity 12/6: NGTD Right glenosphere 12/6: NGTD  MRSA PCR 12/7: negative  Resp 12/9: few normal respiratory flora-no Staph aureus or Pseudomonas seen   Anti-infectives (From admission, onward)    Start     Dose/Rate Route Frequency Ordered Stop   11/23/23 1800  cefTRIAXone (ROCEPHIN) 2 g in sodium chloride 0.9 % 100 mL IVPB        2 g 200 mL/hr over 30 Minutes Intravenous Every 24 hours 11/23/23 1711     11/22/23 1400  DAPTOmycin (CUBICIN) IVPB 700 mg/173mL premix        700 mg 200 mL/hr over 30 Minutes Intravenous Every 48 hours 11/21/23 1609     11/22/23 1100  ceFEPIme (MAXIPIME) 2 g in sodium chloride 0.9 % 100 mL IVPB  Status:  Discontinued        2 g 200 mL/hr over 30 Minutes Intravenous Every 24 hours 11/22/23 1009 11/23/23 1711   11/19/23 2245  hydroxychloroquine (PLAQUENIL) tablet 200 mg  Status:  Discontinued        200 mg Per Tube 2 times daily 11/19/23 2152 11/20/23 1041   11/19/23 1800  DAPTOmycin (CUBICIN) IVPB 500 mg/55mL premix  Status:  Discontinued        6 mg/kg  77.1 kg 100 mL/hr over 30 Minutes Intravenous Every 48 hours 11/18/23 1935 11/18/23 1943   11/18/23 2200  hydroxychloroquine (PLAQUENIL) tablet 200 mg  Status:  Discontinued        200 mg Oral 2 times daily 11/18/23 2003 11/19/23 2152   11/18/23 2100  ceFAZolin (ANCEF) IVPB 2g/100  mL premix  Status:  Discontinued        2 g 200 mL/hr over 30 Minutes Intravenous Every 6 hours 11/18/23 2003 11/18/23 2021   11/18/23 2000  DAPTOmycin (CUBICIN) IVPB 500 mg/27mL premix  Status:  Discontinued        6 mg/kg  77.1 kg 100 mL/hr over 30 Minutes Intravenous Every 48 hours 11/18/23 1906 11/18/23 1935   11/18/23 2000  DAPTOmycin (CUBICIN) IVPB 500 mg/49mL premix  Status:  Discontinued        6 mg/kg  77.1 kg 100 mL/hr over 30 Minutes Intravenous Every 48 hours 11/18/23 1943 11/21/23 1609   11/18/23 1930  cefTRIAXone (ROCEPHIN) 2 g in  sodium chloride 0.9 % 100 mL IVPB  Status:  Discontinued        2 g 200 mL/hr over 30 Minutes Intravenous Every 24 hours 11/18/23 1832 11/22/23 1009   11/18/23 1602  gentamicin (GARAMYCIN) injection  Status:  Discontinued          As needed 11/18/23 1632 11/18/23 1705   11/18/23 1600  tobramycin (NEBCIN) powder  Status:  Discontinued          As needed 11/18/23 1631 11/18/23 1705   11/18/23 1528  vancomycin (VANCOCIN) powder  Status:  Discontinued          As needed 11/18/23 1628 11/18/23 1705   11/18/23 0600  ceFAZolin (ANCEF) IVPB 2g/100 mL premix        2 g 200 mL/hr over 30 Minutes Intravenous On call to O.R. 11/17/23 2319 11/18/23 1527      Significant Hospital Events: Including procedures, antibiotic start and stop dates in addition to other pertinent events   12/06 admitted for arthroplasty revision explant and insertion of antibiotic device. 12/7 PEA arrest intubated and admitted to the ICU.  Chest x-ray with right pneumothorax status post chest tube placement 12/07.  Status post HD catheter placement 12/07 with worsening kidney function. 12/08 remains intubated and sedated.  12/09 failed SBT due to hypoxia, tolerated PSV during the day 12/10 failed SBT due to low tidal volumes, hypoxia 12/11 Pt remains mechanically intubated on minimal vent settings; performed SBT unable to tolerate PS 5/5 due to hypoxia and low tidal volumes; currently tolerating PS 12/5.  Right sided chest tube removed repeat CXR post removal negative for pneumothorax  12/12: Pt remains mechanically intubated on minimal vent settings.  Will perform WUA and SBT today.    Interim History / Subjective:  As outlined above under significant events.  No acute events overnight   Objective   Blood pressure (!) 104/45, pulse (!) 57, temperature 98.4 F (36.9 C), resp. rate 18, height 5\' 3"  (1.6 m), weight 80.4 kg, SpO2 99%.    Vent Mode: PRVC FiO2 (%):  [30 %-40 %] 30 % Set Rate:  [18 bmp] 18 bmp Vt Set:  [500  mL] 500 mL PEEP:  [5 cmH20] 5 cmH20 Pressure Support:  [12 cmH20] 12 cmH20 Plateau Pressure:  [22 cmH20-26 cmH20] 25 cmH20   Intake/Output Summary (Last 24 hours) at 11/24/2023 0828 Last data filed at 11/24/2023 0600 Gross per 24 hour  Intake 1845.5 ml  Output 1125 ml  Net 720.5 ml   Filed Weights   11/21/23 0500 11/22/23 0459 11/23/23 0500  Weight: 78.6 kg 80.8 kg 80.4 kg    Examination: General: Acutely-ill appearing female, NAD mechanically intubated  HENT: Supple, no JVD  Lungs: Diffuse rhonchi throughout, even, non labored  Cardiovascular: NSR, s1s2, no r/g, 2+ radial/2+ distal  pulses, no edema  Abdomen: +BS x4, soft, non tender, non distended  Extremities: RUE ROM limited due to recent surgical procedure  Skin: Right shoulder incision site with honeycomb dressing small amount of dried drainage  Neuro: Sedated not following commands, but opens eyes to voice, PERRL  GU: Required in and out cath this am due to urinary retention   Assessment & Plan:   72 year old female presented on 12/06 for right reverse shoulder arthroplasty due to suspected septic joint complicated by post operative cardiac arrest (PEA).  #Toxic Metabolic Encephalopathy #Mechanical intubation pain/discomfort #Postop pain  Hx: RLS  - Maintain a RASS goal of 0 to -1 - PAD protocol to maintain RASS goal: precedex gtt and prn fentanyl  - Avoid sedating medications as able - Daily wake up assessment - Continue scheduled oxycodone 10 mg q6hrs for pain management  - Continue outpatient pramipexole   #PEA Arrest #NSTEMI #Circulatory Shock #Afib (new onset) Echo 11/20/23: EF 55 to 60%; trivial mitral valve regurgitation - Continuous telemetry monitoring  - Continue heparin gtt  - Continue aspirin   #Acute Hypoxic Respiratory Failure #ARDS #Right traumatic pneumothorax~resolved chest tube removed 12/11 #Possible aspiration pneumonia #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 - Full vent support for now, implement  lung protective strategies - Plateau pressures less than 30 cm H20 - Wean FiO2 & PEEP as tolerated to maintain O2 sats >92% - SBT daily  - VAP bundle implemented  - Prn bronchodilators  #Anemia in stage 4 CKD #Iron deficiency anemia  - Trend CBC  - Monitor for s/sx of bleeding - Transfuse for hgb less than 7 - Continue outpatient ferrous sulfate   #Gastrointestinal - Continues tube feeds, famotidine for SUP  #AKI superimposed on stage 4 CKD~improving  - Trend BMP  - Replace electrolytes as indicated  - Strict intake/output  - Avoid nephrotoxic medications  - Will consult nephrology   #R. Reverse shoulder arthroplasty (9/17) #Revision with converstion to long stemp humeroal component for periprosthetic fracture (10/21) #Right reverse shoulder arthroplasty explant of glenoid and humeral components for infection (12/6) #HAP - Trend WBC and monitor fever curve  - Trend PCT  - Follow cultures  - ID consulted appreciate input - Continue abx as outlined above pending culture results and sensitivities  - Orthopedic consulted appreciate input   Endocrine - CBG's q4hrs  - Follow hyper/hypoglycemic protocol  - Target range 140 to 180  Best Practice (right click and "Reselect all SmartList Selections" daily)   Diet/type: tubefeeds DVT prophylaxis systemic heparin Pressure ulcer(s): N/A GI prophylaxis: H2B Lines: Central line and yes and it is still needed Foley:  N/A Code Status:  full code Last date of multidisciplinary goals of care discussion [11/24/2023]  Labs   CBC: Recent Labs  Lab 11/19/23 0216 11/19/23 1310 11/19/23 2039 11/20/23 0252 11/20/23 2207 11/21/23 0402 11/22/23 0407 11/23/23 0544 11/24/23 0448  WBC 8.6 14.8*   < > 22.6*  --  15.3* 14.6* 8.8 8.8  NEUTROABS 7.1 9.3*  --   --   --   --   --   --   --   HGB 7.1* 8.7*   < > 7.6* 7.6* 7.6* 7.4* 7.2* 7.5*  HCT 23.4* 29.1*   < > 24.6* 23.9* 23.8* 23.4* 22.8* 23.9*  MCV 92.9 96.7   < > 91.1  --  89.5  92.9 91.2 89.2  PLT 304 334   < > 275  --  251 230 227 234   < > = values  in this interval not displayed.    Basic Metabolic Panel: Recent Labs  Lab 11/20/23 0252 11/20/23 2207 11/21/23 0402 11/22/23 0407 11/23/23 0544 11/24/23 0448  NA 138 137 137 140 136 137  K 5.1 3.9 3.7 3.9 3.9 4.5  CL 104 104 104 104 101 104  CO2 24 23 25  21* 22 25  GLUCOSE 103* 112* 107* 96 155* 146*  BUN 73* 67* 67* 72* 76* 84*  CREATININE 2.80* 2.79* 2.66* 2.96* 2.88* 2.61*  CALCIUM 8.8* 8.5* 8.6* 8.7* 8.6* 8.5*  MG 2.1  --  2.0 2.0 2.0 2.1  PHOS 4.9*  --  4.0 5.2* 4.1 3.4   GFR: Estimated Creatinine Clearance: 20.1 mL/min (A) (by C-G formula based on SCr of 2.61 mg/dL (H)). Recent Labs  Lab 11/19/23 1311 11/19/23 1455 11/19/23 2039 11/20/23 0252 11/21/23 0402 11/22/23 0407 11/23/23 0544 11/24/23 0448  WBC  --   --  27.5*   < > 15.3* 14.6* 8.8 8.8  LATICACIDVEN 7.3* 2.2* 1.5  --   --   --   --   --    < > = values in this interval not displayed.    Liver Function Tests: Recent Labs  Lab 11/19/23 1310 11/19/23 2039 11/20/23 0252 11/21/23 0402 11/22/23 0407  AST 96* 122* 102* 51* 36  ALT 55* 54* 41 20 16  ALKPHOS 135* 124 115 120 152*  BILITOT 0.9 0.6 0.7 0.8 0.9  PROT 6.9 6.7 6.3* 6.1* 6.0*  ALBUMIN 2.8* 2.6* 2.6* 2.4* 2.1*   No results for input(s): "LIPASE", "AMYLASE" in the last 168 hours. No results for input(s): "AMMONIA" in the last 168 hours.  ABG    Component Value Date/Time   PHART 7.33 (L) 11/21/2023 1742   PCO2ART 46 11/21/2023 1742   PO2ART 143 (H) 11/21/2023 1742   HCO3 24.3 11/21/2023 1742   ACIDBASEDEF 1.9 11/21/2023 1742   O2SAT 98.5 11/21/2023 1742     Coagulation Profile: Recent Labs  Lab 11/19/23 1310 11/22/23 1247  INR 1.4* 1.3*    Cardiac Enzymes: Recent Labs  Lab 11/19/23 0216 11/19/23 1310 11/19/23 2039 11/23/23 0544  CKTOTAL 108 134 291* 35*    HbA1C: No results found for: "HGBA1C"  CBG: Recent Labs  Lab 11/23/23 1613  11/23/23 1949 11/24/23 0015 11/24/23 0404 11/24/23 0732  GLUCAP 125* 125* 125* 128* 141*    Past Medical History:  She,  has a past medical history of Acute hypoxemic respiratory failure (HCC), Anemia in stage 4 chronic kidney disease (HCC), Anginal pain (HCC), Anxiety, Aortic atherosclerosis (HCC), CHF (congestive heart failure) (HCC), Chronic pain, Chronic radicular pain of lower back, CKD (chronic kidney disease), stage IV (HCC), Complication of anesthesia, Coronary artery calcification seen on CT scan, Costochondritis, DDD (degenerative disc disease), lumbar, Depression, Dyspnea, GERD (gastroesophageal reflux disease), Hiatal hernia, Hip dysplasia, Hyperlipidemia, Hypertension, Insomnia, Iron deficiency, Iron deficiency anemia, Low back pain, Low vitamin B12 level, Lumbar facet joint pain, Migraines, Nose colonized with MRSA (10/03/2020), Numbness and tingling of right leg, OA (osteoarthritis), Obesity, OSA (obstructive sleep apnea), Osteoporosis, Pelvic fracture (HCC), Pneumonia, Pulmonary fibrosis (HCC), Raynaud's disease without gangrene, Restless leg syndrome, Rheumatoid arthritis (HCC), Right renal mass (05/28/2023), Seasonal allergies, and Thoracic compression fracture (HCC).   Surgical History:   Past Surgical History:  Procedure Laterality Date   ABDOMINAL SURGERY     pt denies   APPENDECTOMY     CARPAL TUNNEL RELEASE Bilateral    CHOLECYSTECTOMY     COLONOSCOPY WITH PROPOFOL N/A 08/15/2020  Procedure: COLONOSCOPY WITH PROPOFOL;  Surgeon: Earline Mayotte, MD;  Location: [facility] ENDOSCOPY;  Service: Endoscopy;  Laterality: N/A;   CYSTOSCOPY W/ RETROGRADES Right 07/18/2023   Procedure: CYSTOSCOPY WITH RETROGRADE PYELOGRAM;  Surgeon: [name], MD;  Location: [facility] ORS;  Service: Urology;  Laterality: Right;   DILATION AND CURETTAGE OF UTERUS     ESOPHAGOGASTRODUODENOSCOPY (EGD) WITH PROPOFOL N/A 08/15/2020   Procedure: ESOPHAGOGASTRODUODENOSCOPY (EGD) WITH PROPOFOL;  Surgeon:  Earline Mayotte, MD;  Location: [facility] ENDOSCOPY;  Service: Endoscopy;  Laterality: N/A;   FRACTURE SURGERY     hip fracture    FRACTURE SURGERY     pelvic fracture plate    HIP SURGERY Left    KNEE ARTHROPLASTY Right 10/13/2020   Procedure: COMPUTER ASSISTED TOTAL KNEE ARTHROPLASTY - RNFA;  Surgeon: Donato Heinz, MD;  Location: [facility] ORS;  Service: Orthopedics;  Laterality: Right;   REVERSE SHOULDER ARTHROPLASTY Right 08/30/2023   Procedure: Right reverse shoulder arthroplasty, biceps tenodesis;  Surgeon: Signa Kell, MD;  Location: [facility] ORS;  Service: Orthopedics;  Laterality: Right;   TOTAL SHOULDER REVISION Right 10/03/2023   Procedure: Revision right reverse shoulder arthroplasty with conversion to long humeral stem and open reduction internal fixation of the humerus;  Surgeon: Signa Kell, MD;  Location: [facility] ORS;  Service: Orthopedics;  Laterality: Right;   TOTAL SHOULDER REVISION Right 11/18/2023   Procedure: Right revision reverse shoulder arthroplasty (conversion to longstem and glenosphere exchange);  Surgeon: Signa Kell, MD;  Location: [facility] ORS;  Service: Orthopedics;  Laterality: Right;   TUBAL LIGATION     URETEROSCOPY  07/18/2023   Procedure: DIAGNOSTIC URETEROSCOPY;  Surgeon: [name], MD;  Location: [facility] ORS;  Service: Urology;;     Social History:   reports that she has never smoked. She has never used smokeless tobacco. She reports that she does not drink alcohol and does not use drugs.   Family History:  Her family history includes Aneurysm in her father; Cancer in her brother; Diabetes in her brother, maternal grandfather, mother, and son; Heart disease in her maternal grandfather; Hypertension in her mother; Osteosarcoma in her brother; Seizures in her son.   Allergies Allergies  Allergen Reactions   Gabapentin Other (See Comments)    Weight gain   Ibuprofen Other (See Comments)    Headache     Home Medications  Prior to Admission medications    Medication Sig Start Date End Date Taking? Authorizing Provider  acetaminophen (TYLENOL) 500 MG tablet Take 2 tablets (1,000 mg total) by mouth every 6 (six) hours as needed. 08/31/23  Yes Anson Oregon, PA-C  aspirin EC 325 MG tablet Take 1 tablet (325 mg total) by mouth daily. 08/31/23  Yes Anson Oregon, PA-C  calcitRIOL (ROCALTROL) 0.25 MCG capsule Take 0.25 mcg by mouth every morning. 05/22/21  Yes [provider]  Calcium Carb-Cholecalciferol 600-400 MG-UNIT CAPS Take 1 capsule by mouth 3 (three) times daily. 01/13/10  Yes [provider]  chlorhexidine (HIBICLENS) 4 % external liquid Apply 15 mLs (1 Application total) topically as directed for 30 doses. Use as directed daily for 5 days every other week for 6 weeks. 11/18/23  Yes Signa Kell, MD  Cholecalciferol 25 MCG (1000 UT) tablet Take 1,000 Units by mouth every morning. 09/23/09  Yes [provider]  cyanocobalamin 1000 MCG tablet Take 1,000 mcg by mouth every morning.   Yes [provider]  cyclobenzaprine (FLEXERIL) 10 MG tablet Take 10 mg by mouth at bedtime as needed for muscle  spasms (Leg cramps).   Yes [provider]  enalapril-hydrochlorothiazide (VASERETIC) 10-25 MG tablet Take 1 tablet by mouth at bedtime. 09/12/23  Yes Hammock, Lavonna Rua, NP  ferrous sulfate 325 (65 FE) MG tablet Take 325 mg by mouth daily with breakfast.   Yes [provider]  furosemide (LASIX) 40 MG tablet Take 1.5 tablets (60 mg total) by mouth every morning. Increase to 1.5 tablet (60 mg total) by mouth in morning and extra 1 tablet (40 mg total for maximum daily dose 100 mg) at lunch time as needed for up to 3 days for increased leg swelling, shortness of breath, weight gain 5+ lbs over 1-2 days. Seek medical care if these symptoms are not improving with increased dose. 09/02/23  Yes Sunnie Nielsen, DO  hydroxychloroquine (PLAQUENIL) 200 MG tablet Take 200 mg by mouth 2 (two) times daily. 06/22/21   Yes [provider]  mirtazapine (REMERON) 30 MG tablet Take 30 mg by mouth at bedtime. 06/29/22  Yes [provider]  Multiple Vitamins-Minerals (MULTIVITAMIN ADULT PO) Take 1 tablet by mouth every morning. 09/10/08  Yes [provider]  mupirocin ointment (BACTROBAN) 2 % Apply small about inside of both nostrils TWICE a day for the next 5 days. 08/25/23  Yes Verlee Monte, NP  mupirocin ointment (BACTROBAN) 2 % Place 1 Application into the nose 2 (two) times daily for 60 doses. Use as directed 2 times daily for 5 days every other week for 6 weeks. 11/18/23 12/18/23 Yes Signa Kell, MD  omeprazole (PRILOSEC) 40 MG capsule Take 40 mg by mouth every morning.   Yes [provider]  ondansetron (ZOFRAN) 4 MG tablet Take 1 tablet (4 mg total) by mouth every 6 (six) hours as needed for nausea. 08/31/23  Yes Anson Oregon, PA-C  oxyCODONE (OXY IR/ROXICODONE) 5 MG immediate release tablet Take 1 tablet (5 mg total) by mouth every 4 (four) hours as needed for moderate pain (pain score 4-6) (pain score 4-6). 10/04/23  Yes Dedra Skeens, PA-C  Pirfenidone 267 MG TABS Take 801 tablets by mouth 3 (three) times daily. 12/24/22  Yes [provider]  pramipexole (MIRAPEX) 0.125 MG tablet Take 0.125-0.25 mg by mouth See admin instructions. Take 0.125 mg in the morning and 0.25 mg  at bedtime 02/15/18  Yes [provider]  PROLIA 60 MG/ML SOSY injection Inject 60 mg into the skin every 6 (six) months. 04/06/23  Yes [provider]  rosuvastatin (CRESTOR) 5 MG tablet Take 5 mg by mouth every other day. 07/18/19  Yes [provider]  spironolactone (ALDACTONE) 25 MG tablet Take 25 mg by mouth at bedtime. 04/09/21  Yes [provider]  tetrahydrozoline 0.05 % ophthalmic solution Place 1 drop into both eyes daily as needed (allergies).   Yes [provider]  venlafaxine XR (EFFEXOR-XR) 150 MG 24 hr capsule Take 150 mg by mouth See admin  instructions. Take with 75 mg for total 225 mg in the morning 12/28/22 12/28/23 Yes [provider]  venlafaxine XR (EFFEXOR-XR) 75 MG 24 hr capsule Take 75 mg by mouth See admin instructions. Take with 150 mg for a total of 225 mg in the morning 08/24/23  Yes [provider]  chlorhexidine (HIBICLENS) 4 % external liquid Apply 15 mLs (1 Application total) topically as directed for 30 doses. Use as directed daily for 5 days every other week for 6 weeks. Patient not taking: Reported on 11/17/2023 10/03/23   Signa Kell, MD  Critical care time: 40 minutes    Zada Girt, AGNP  Pulmonary/Critical Care Pager [phone] (please enter 7 digits) PCCM Consult Pager [phone] (please enter 7 digits)

## 2023-11-24 NOTE — Progress Notes (Signed)
Pt successfully extubated to [facility] 40% @ 40L with no complications. Pt tol well with sats of 97-98%.

## 2023-11-24 NOTE — Progress Notes (Signed)
Will see patient in am

## 2023-11-24 NOTE — Progress Notes (Signed)
Subjective: 6 Days Post-Op Procedure(s) (LRB): Right revision reverse shoulder arthroplasty (conversion to longstem and glenosphere exchange) (Right) Patient in the ICU.  Patient is intubated, with chest tube.  Unable to extubate.  Status post cardiac arrest with tension pneumothorax.  Objective: Vital signs in last 24 hours: Temp:  [98.1 F (36.7 C)-98.9 F (37.2 C)] 98.4 F (36.9 C) (12/12 0400) Pulse Rate:  [48-84] 57 (12/12 0730) Resp:  [0-28] 18 (12/12 0730) BP: (89-156)/(43-77) 104/45 (12/12 0730) SpO2:  [91 %-100 %] 99 % (12/12 0739) FiO2 (%):  [30 %-40 %] 30 % (12/12 0739)  Intake/Output from previous day: 12/11 0701 - 12/12 0700 In: 1925.7 [I.V.:940.2; NG/GT:785.5; IV Piggyback:200.1] Out: 1350 [Urine:1350] Intake/Output this shift: No intake/output data recorded.  Recent Labs    11/22/23 0407 11/23/23 0544 11/24/23 0448  HGB 7.4* 7.2* 7.5*   Recent Labs    11/23/23 0544 11/24/23 0448  WBC 8.8 8.8  RBC 2.50* 2.68*  HCT 22.8* 23.9*  PLT 227 234   Recent Labs    11/23/23 0544 11/24/23 0448  NA 136 137  K 3.9 4.5  CL 101 104  CO2 22 25  BUN 76* 84*  CREATININE 2.88* 2.61*  GLUCOSE 155* 146*  CALCIUM 8.6* 8.5*   Recent Labs    11/22/23 1247  INR 1.3*    EXAM General - Patient is intubated, nonresponsive Extremity - Intact pulses distally No cellulitis present Compartment soft Dressing intact and dry Dressing - Scant drainage distally.  shoulder immobilizer in place.  Pillow removed.     Past Medical History:  Diagnosis Date   Acute hypoxemic respiratory failure (HCC)    Anemia in stage 4 chronic kidney disease (HCC)    Anginal pain (HCC)    Anxiety    Aortic atherosclerosis (HCC)    CHF (congestive heart failure) (HCC)    a.) TTE 09/30/2021: EF 55-60%, no RWMAs, mild MR, G1DD   Chronic pain    Chronic radicular pain of lower back    CKD (chronic kidney disease), stage IV (HCC)    Complication of anesthesia    a.) delayed  emergence following ureteroscopy 07/2023   Coronary artery calcification seen on CT scan    Costochondritis    DDD (degenerative disc disease), lumbar    Depression    Dyspnea    GERD (gastroesophageal reflux disease)    Hiatal hernia    Hip dysplasia    Hyperlipidemia    Hypertension    Insomnia    Iron deficiency    Iron deficiency anemia    Low back pain    Low vitamin B12 level    Lumbar facet joint pain    Migraines    Nose colonized with MRSA 10/03/2020   a.) preop PCR (+) 10/03/2020 prior to RIGHT TKA; b.) preop PCR (+) 08/25/2023 prior to RIGHT REVERSE SHOULDER ARTHROPLASTY; BICEPS TENODESIS   Numbness and tingling of right leg    OA (osteoarthritis)    Obesity    OSA (obstructive sleep apnea)    a.) not currently utilizing nocturnal PAP therapy; positional. PCCM feels as if symptom can be mitigated with diet/exercise/weight loss unless develops significant symptoms.   Osteoporosis    a.) recieves denosumab injections   Pelvic fracture (HCC)    Pneumonia    Pulmonary fibrosis (HCC)    Raynaud's disease without gangrene    Restless leg syndrome    a.) on pramipexole + oral Fe supplementation   Rheumatoid arthritis (HCC)    a.)  Tx'd with hydroxychloroquine   Right renal mass 05/28/2023   a.) CT renal 05/28/2023:  5.7 cm mass in the medial aspect of right kidney   Seasonal allergies    Thoracic compression fracture (HCC)     Assessment/Plan:   6 Days Post-Op Procedure(s) (LRB): Right revision reverse shoulder arthroplasty (conversion to longstem and glenosphere exchange) (Right) Principal Problem:   Periprosthetic fracture around internal prosthetic right shoulder joint Active Problems:   Hypertension   Seropositive rheumatoid arthritis (HCC)   Acute respiratory failure with hypoxia (HCC)   Septic arthritis (HCC)   Acute on chronic anemia   (HFpEF) heart failure with preserved ejection fraction (HCC)   CKD (chronic kidney disease) stage 4, GFR 15-29 ml/min  (HCC)   ILD (interstitial lung disease) (HCC)   Tension pneumothorax   Cardiac arrest, cause unspecified (HCC)  Estimated body mass index is 31.4 kg/m as calculated from the following:   Height as of this encounter: 5\' 3"  (1.6 m).   Weight as of this encounter: 80.4 kg.  Vital signs are stable  Labs are stable, Hgb 7.5, WBC 8.8 and decreasing  Continue with IV abx, cultures pending.  Appreciate ID consult and following.  Chest tube removed 12/11.  Possible extubation today.    DVT Prophylaxis - Lovenox, TED hose, and SCDs    Dedra Skeens, PA-C [facility] 11/24/2023, 8:23 AM

## 2023-11-24 NOTE — Plan of Care (Signed)

## 2023-11-24 NOTE — Progress Notes (Signed)
Pharmacy Antibiotic Note  Tonya Cummings is a 72 y.o. female admitted on 11/18/2023 with  septic arthritis  following shoulder arthroplasty.  Pharmacy has been consulted for Daptomycin dosing. PMH of Rheumatoid arthritis-on hydroxychloroquine (Prolia every 6 months)  Today, 11/24/2023 Day 6 daptomycin/ceftriaxone Renal: H/o CKD, SCr appears stable with CrCl < 30 ml/min WBC to WNL Afebrile OR 11/18/23  Periprosthetic infection with explant of glenoid and humeral components, Right humerus insertion of antibiotic deliver device 12/7 CK 291 (following OR) 12/11 CK 35 On rosuvastatin 5mg  every OTHER day 12/6 OR cultures: No growth to date - holding x 21 days due to increased risk for propionibacterium with shoulder arthroplasty  Plan: Continue Daptomycin to 700mg  (8.89 mg/kg) V q48h for Crcl ~20 ml/min (next dose due 12/10 at 1400) Continue ceftriaxone 2gm IV q24h as ordered by physician  Follow CK at least weekly  Follow cultures Follow renal function    Height: 5\' 3"  (160 cm) Weight: 80.4 kg (177 lb 4 oz) IBW/kg (Calculated) : 52.4  Temp (24hrs), Avg:98.4 F (36.9 C), Min:98.1 F (36.7 C), Max:98.9 F (37.2 C)  Recent Labs  Lab 11/19/23 1311 11/19/23 1455 11/19/23 2039 11/20/23 0252 11/20/23 2207 11/21/23 0402 11/22/23 0407 11/23/23 0544 11/24/23 0448  WBC  --   --  27.5* 22.6*  --  15.3* 14.6* 8.8 8.8  CREATININE  --   --  2.83* 2.80* 2.79* 2.66* 2.96* 2.88* 2.61*  LATICACIDVEN 7.3* 2.2* 1.5  --   --   --   --   --   --     Estimated Creatinine Clearance: 20.1 mL/min (A) (by C-G formula based on SCr of 2.61 mg/dL (H)).    Allergies  Allergen Reactions   Gabapentin Other (See Comments)    Weight gain   Ibuprofen Other (See Comments)    Headache    Antimicrobials this admission: Vanc 2 gm mixed w/ cement, gentamicin 80 mg mixed w/ cement, cefazolin 2gm x 1 Dapto 12/6 >>   Ceftriaxone 12/6 >>  Dose adjustments this admission:    Microbiology results: 12/6  BCx: NG-final   UCx:      Sputum:    12/6 MRSA PCR: positive 12/6 wound cx pending  Thank you for allowing pharmacy to be a part of this patient's care.  Juliette Alcide, PharmD, BCPS, BCIDP Work Cell: [phone] 11/24/2023 2:03 PM

## 2023-11-24 NOTE — Progress Notes (Signed)
PHARMACY CONSULT NOTE  Pharmacy Consult for Electrolyte Monitoring and Replacement   Recent Labs: Potassium (mmol/L)  Date Value  11/24/2023 4.5   Magnesium (mg/dL)  Date Value  09/81/1914 2.1   Calcium (mg/dL)  Date Value  78/29/5621 8.5 (L)   Albumin (g/dL)  Date Value  30/86/5784 2.1 (L)   Phosphorus (mg/dL)  Date Value  69/62/9528 3.4   Sodium (mmol/L)  Date Value  11/24/2023 137  09/12/2023 142   Assessment: 72 y.o. female with medical history significant of CKD Stage 4, HFpEF, HTN, B12 deficiency, anemia of CKD, rheumatoid arthritis, Raynaud's phenomenon, OSA not on CPAP, who was admitted on 12/6 for recurrent R shoulder revision following arthroplasty.   Goal of Therapy:  K >= 4, Mg >= 2  Plan:  --No electrolyte replacement indicated at this time --Labs tomorrow  Tressie Ellis 11/24/2023 7:59 AM

## 2023-11-24 NOTE — Consult Note (Addendum)
PHARMACY - ANTICOAGULATION CONSULT NOTE  Pharmacy Consult for IV Heparin Indication: atrial fibrillation  Patient Measurements: Height: 5\' 3"  (160 cm) Weight: 80.4 kg (177 lb 4 oz) IBW/kg (Calculated) : 52.4 Heparin Dosing Weight: 70 kg  Labs: Recent Labs    11/21/23 0402 11/22/23 0407 11/22/23 1247 11/22/23 2029 11/23/23 0544 11/23/23 1539 11/24/23 0039  HGB 7.6* 7.4*  --   --  7.2*  --   --   HCT 23.8* 23.4*  --   --  22.8*  --   --   PLT 251 230  --   --  227  --   --   APTT  --   --  45*  --   --   --   --   LABPROT  --   --  16.8*  --   --   --   --   INR  --   --  1.3*  --   --   --   --   HEPARINUNFRC  --   --   --    < > 0.13* 0.29* 0.34  CREATININE 2.66* 2.96*  --   --  2.88*  --   --   CKTOTAL  --   --   --   --  35*  --   --    < > = values in this interval not displayed.   Estimated Creatinine Clearance: 18.2 mL/min (A) (by C-G formula based on SCr of 2.88 mg/dL (H)).  Medical History: Past Medical History:  Diagnosis Date   Acute hypoxemic respiratory failure (HCC)    Anemia in stage 4 chronic kidney disease (HCC)    Anginal pain (HCC)    Anxiety    Aortic atherosclerosis (HCC)    CHF (congestive heart failure) (HCC)    a.) TTE 09/30/2021: EF 55-60%, no RWMAs, mild MR, G1DD   Chronic pain    Chronic radicular pain of lower back    CKD (chronic kidney disease), stage IV (HCC)    Complication of anesthesia    a.) delayed emergence following ureteroscopy 07/2023   Coronary artery calcification seen on CT scan    Costochondritis    DDD (degenerative disc disease), lumbar    Depression    Dyspnea    GERD (gastroesophageal reflux disease)    Hiatal hernia    Hip dysplasia    Hyperlipidemia    Hypertension    Insomnia    Iron deficiency    Iron deficiency anemia    Low back pain    Low vitamin B12 level    Lumbar facet joint pain    Migraines    Nose colonized with MRSA 10/03/2020   a.) preop PCR (+) 10/03/2020 prior to RIGHT TKA; b.) preop PCR  (+) 08/25/2023 prior to RIGHT REVERSE SHOULDER ARTHROPLASTY; BICEPS TENODESIS   Numbness and tingling of right leg    OA (osteoarthritis)    Obesity    OSA (obstructive sleep apnea)    a.) not currently utilizing nocturnal PAP therapy; positional. PCCM feels as if symptom can be mitigated with diet/exercise/weight loss unless develops significant symptoms.   Osteoporosis    a.) recieves denosumab injections   Pelvic fracture (HCC)    Pneumonia    Pulmonary fibrosis (HCC)    Raynaud's disease without gangrene    Restless leg syndrome    a.) on pramipexole + oral Fe supplementation   Rheumatoid arthritis (HCC)    a.) Tx'd with hydroxychloroquine   Right renal  mass 05/28/2023   a.) CT renal 05/28/2023:  5.7 cm mass in the medial aspect of right kidney   Seasonal allergies    Thoracic compression fracture (HCC)    Medications:  No anticoagulation prior to admission per my chart review  Assessment: 72 y/o F with a past medical history of CKD IV, HFpEF, hypertension, RA, OSA not on CPAP who came to [facility] 12/6 for right reverse shoulder arthroplasty after for suspicion of septic joint. Patient experienced cardiac arrest post-operatively on 12/7. She is intubated, sedated and on mechanical ventilation in the ICU. Hospital course now further complicated by paroxysmal Afib with RVR. Patient has been placed on amiodarone. Pharmacy consulted for heparin.  Baseline aPTT and PT-INR are pending. CBC is notable for anemia which is chronic.  12/10 20:29 HL <0.10 12/11 0544 HL 0.13, subtherapeutic 12/11 1539 HL 0.29, subtherapeutic 12/12 0039 HL 0.34, therapeutic x 1  Goal of Therapy:  Heparin level 0.3-0.7 units/ml Monitor platelets by anticoagulation protocol: Yes   Plan:  --Continue heparin at 1550 units/hr --Recheck HL in 8 hrs to confirm --Daily CBC per protocol while on IV heparin  Otelia Sergeant, PharmD, [facility] 11/24/2023 3:14 AM

## 2023-11-24 NOTE — Consult Note (Signed)
PHARMACY - ANTICOAGULATION CONSULT NOTE  Pharmacy Consult for IV Heparin Indication: atrial fibrillation  Patient Measurements: Height: 5\' 3"  (160 cm) Weight: 80.4 kg (177 lb 4 oz) IBW/kg (Calculated) : 52.4 Heparin Dosing Weight: 70 kg  Labs: Recent Labs    11/22/23 0407 11/22/23 1247 11/22/23 2029 11/23/23 0544 11/23/23 1539 11/24/23 0039 11/24/23 0448 11/24/23 0920  HGB 7.4*  --   --  7.2*  --   --  7.5*  --   HCT 23.4*  --   --  22.8*  --   --  23.9*  --   PLT 230  --   --  227  --   --  234  --   APTT  --  45*  --   --   --   --   --   --   LABPROT  --  16.8*  --   --   --   --   --   --   INR  --  1.3*  --   --   --   --   --   --   HEPARINUNFRC  --   --    < > 0.13* 0.29* 0.34  --  0.28*  CREATININE 2.96*  --   --  2.88*  --   --  2.61*  --   CKTOTAL  --   --   --  35*  --   --   --   --    < > = values in this interval not displayed.   Estimated Creatinine Clearance: 20.1 mL/min (A) (by C-G formula based on SCr of 2.61 mg/dL (H)).  Medical History: Past Medical History:  Diagnosis Date   Acute hypoxemic respiratory failure (HCC)    Anemia in stage 4 chronic kidney disease (HCC)    Anginal pain (HCC)    Anxiety    Aortic atherosclerosis (HCC)    CHF (congestive heart failure) (HCC)    a.) TTE 09/30/2021: EF 55-60%, no RWMAs, mild MR, G1DD   Chronic pain    Chronic radicular pain of lower back    CKD (chronic kidney disease), stage IV (HCC)    Complication of anesthesia    a.) delayed emergence following ureteroscopy 07/2023   Coronary artery calcification seen on CT scan    Costochondritis    DDD (degenerative disc disease), lumbar    Depression    Dyspnea    GERD (gastroesophageal reflux disease)    Hiatal hernia    Hip dysplasia    Hyperlipidemia    Hypertension    Insomnia    Iron deficiency    Iron deficiency anemia    Low back pain    Low vitamin B12 level    Lumbar facet joint pain    Migraines    Nose colonized with MRSA 10/03/2020    a.) preop PCR (+) 10/03/2020 prior to RIGHT TKA; b.) preop PCR (+) 08/25/2023 prior to RIGHT REVERSE SHOULDER ARTHROPLASTY; BICEPS TENODESIS   Numbness and tingling of right leg    OA (osteoarthritis)    Obesity    OSA (obstructive sleep apnea)    a.) not currently utilizing nocturnal PAP therapy; positional. PCCM feels as if symptom can be mitigated with diet/exercise/weight loss unless develops significant symptoms.   Osteoporosis    a.) recieves denosumab injections   Pelvic fracture (HCC)    Pneumonia    Pulmonary fibrosis (HCC)    Raynaud's disease without gangrene    Restless leg  syndrome    a.) on pramipexole + oral Fe supplementation   Rheumatoid arthritis (HCC)    a.) Tx'd with hydroxychloroquine   Right renal mass 05/28/2023   a.) CT renal 05/28/2023:  5.7 cm mass in the medial aspect of right kidney   Seasonal allergies    Thoracic compression fracture (HCC)    Medications:  No anticoagulation prior to admission per my chart review  Assessment: 72 y/o F with a past medical history of CKD IV, HFpEF, hypertension, RA, OSA not on CPAP who came to [facility] 12/6 for right reverse shoulder arthroplasty after for suspicion of septic joint. Patient experienced cardiac arrest post-operatively on 12/7. She is intubated, sedated and on mechanical ventilation in the ICU. Hospital course now further complicated by paroxysmal Afib with RVR. Patient has been placed on amiodarone. Pharmacy consulted for heparin.  Baseline aPTT and PT-INR are pending. CBC is notable for anemia which is chronic.  12/10 20:29 HL <0.10 12/11 0544 HL 0.13, subtherapeutic 12/11 1539 HL 0.29, subtherapeutic 12/12 0039 HL 0.34, therapeutic x 1 12/12 0920 HL 0.28, subtherapeutic  Goal of Therapy:  Heparin level 0.3-0.7 units/ml Monitor platelets by anticoagulation protocol: Yes   Plan:  --Heparin level is slightly subtherapeutic --Heparin 1000 unit IV bolus and increase heparin infusion to 1700 units/hr   --Recheck HL in 8 hrs after rate change --Daily CBC per protocol while on IV heparin  Tressie Ellis 11/24/2023 9:48 AM

## 2023-11-25 ENCOUNTER — Other Ambulatory Visit: Payer: Self-pay

## 2023-11-25 ENCOUNTER — Inpatient Hospital Stay: Payer: Medicare HMO

## 2023-11-25 DIAGNOSIS — J9601 Acute respiratory failure with hypoxia: Secondary | ICD-10-CM | POA: Diagnosis not present

## 2023-11-25 DIAGNOSIS — T8459XA Infection and inflammatory reaction due to other internal joint prosthesis, initial encounter: Secondary | ICD-10-CM | POA: Diagnosis not present

## 2023-11-25 DIAGNOSIS — I5032 Chronic diastolic (congestive) heart failure: Secondary | ICD-10-CM | POA: Diagnosis not present

## 2023-11-25 DIAGNOSIS — J930 Spontaneous tension pneumothorax: Secondary | ICD-10-CM | POA: Diagnosis not present

## 2023-11-25 DIAGNOSIS — I469 Cardiac arrest, cause unspecified: Secondary | ICD-10-CM | POA: Diagnosis not present

## 2023-11-25 LAB — CBC
HCT: 28.3 % — ABNORMAL LOW (ref 36.0–46.0)
Hemoglobin: 8.6 g/dL — ABNORMAL LOW (ref 12.0–15.0)
MCH: 28.5 pg (ref 26.0–34.0)
MCHC: 30.4 g/dL (ref 30.0–36.0)
MCV: 93.7 fL (ref 80.0–100.0)
Platelets: 274 10*3/uL (ref 150–400)
RBC: 3.02 MIL/uL — ABNORMAL LOW (ref 3.87–5.11)
RDW: 16.6 % — ABNORMAL HIGH (ref 11.5–15.5)
WBC: 11 10*3/uL — ABNORMAL HIGH (ref 4.0–10.5)
nRBC: 0.3 % — ABNORMAL HIGH (ref 0.0–0.2)

## 2023-11-25 LAB — BLOOD GAS, VENOUS
Acid-base deficit: 3.2 mmol/L — ABNORMAL HIGH (ref 0.0–2.0)
Bicarbonate: 23.6 mmol/L (ref 20.0–28.0)
O2 Saturation: 68.8 %
Patient temperature: 37
pCO2, Ven: 48 mm[Hg] (ref 44–60)
pH, Ven: 7.3 (ref 7.25–7.43)
pO2, Ven: 39 mm[Hg] (ref 32–45)

## 2023-11-25 LAB — BASIC METABOLIC PANEL
Anion gap: 14 (ref 5–15)
BUN: 90 mg/dL — ABNORMAL HIGH (ref 8–23)
CO2: 20 mmol/L — ABNORMAL LOW (ref 22–32)
Calcium: 8.9 mg/dL (ref 8.9–10.3)
Chloride: 103 mmol/L (ref 98–111)
Creatinine, Ser: 2.74 mg/dL — ABNORMAL HIGH (ref 0.44–1.00)
GFR, Estimated: 18 mL/min — ABNORMAL LOW (ref >60)
Glucose, Bld: 92 mg/dL (ref 70–99)
Potassium: 4.6 mmol/L (ref 3.5–5.1)
Sodium: 137 mmol/L (ref 135–145)

## 2023-11-25 LAB — GLUCOSE, CAPILLARY
Glucose-Capillary: 64 mg/dL — ABNORMAL LOW (ref 70–99)
Glucose-Capillary: 67 mg/dL — ABNORMAL LOW (ref 70–99)
Glucose-Capillary: 73 mg/dL (ref 70–99)
Glucose-Capillary: 79 mg/dL (ref 70–99)
Glucose-Capillary: 83 mg/dL (ref 70–99)
Glucose-Capillary: 89 mg/dL (ref 70–99)
Glucose-Capillary: 96 mg/dL (ref 70–99)

## 2023-11-25 LAB — MAGNESIUM: Magnesium: 2.1 mg/dL (ref 1.7–2.4)

## 2023-11-25 LAB — PHOSPHORUS: Phosphorus: 5 mg/dL — ABNORMAL HIGH (ref 2.5–4.6)

## 2023-11-25 LAB — HEPARIN LEVEL (UNFRACTIONATED): Heparin Unfractionated: 0.39 [IU]/mL (ref 0.30–0.70)

## 2023-11-25 MED ORDER — METOPROLOL TARTRATE 5 MG/5ML IV SOLN
5.0000 mg | Freq: Once | INTRAVENOUS | Status: AC
  Administered 2023-11-26: 5 mg via INTRAVENOUS
  Filled 2023-11-25: qty 5

## 2023-11-25 MED ORDER — HYDROMORPHONE HCL 1 MG/ML IJ SOLN
0.5000 mg | INTRAMUSCULAR | Status: DC | PRN
  Administered 2023-11-25 – 2023-11-28 (×9): 0.5 mg via INTRAVENOUS
  Filled 2023-11-25 (×11): qty 1

## 2023-11-25 MED ORDER — FENTANYL CITRATE PF 50 MCG/ML IJ SOSY
25.0000 ug | PREFILLED_SYRINGE | INTRAMUSCULAR | Status: DC | PRN
  Administered 2023-11-25 (×4): 50 ug via INTRAVENOUS
  Filled 2023-11-25 (×4): qty 1

## 2023-11-25 NOTE — Consult Note (Signed)
PHARMACY - ANTICOAGULATION CONSULT NOTE  Pharmacy Consult for IV Heparin Indication: atrial fibrillation  Patient Measurements: Height: 5\' 3"  (160 cm) Weight: 80.4 kg (177 lb 4 oz) IBW/kg (Calculated) : 52.4 Heparin Dosing Weight: 70 kg  Labs: Recent Labs    11/22/23 1247 11/22/23 2029 11/23/23 0544 11/23/23 1539 11/24/23 0448 11/24/23 0920 11/24/23 1817 11/25/23 0152  HGB  --   --  7.2*  --  7.5*  --   --  8.6*  HCT  --   --  22.8*  --  23.9*  --   --  28.3*  PLT  --   --  227  --  234  --   --  274  APTT 45*  --   --   --   --   --   --   --   LABPROT 16.8*  --   --   --   --   --   --   --   INR 1.3*  --   --   --   --   --   --   --   HEPARINUNFRC  --    < > 0.13*   < >  --  0.28* 0.42 0.39  CREATININE  --   --  2.88*  --  2.61*  --   --  2.74*  CKTOTAL  --   --  35*  --   --   --   --   --    < > = values in this interval not displayed.   Estimated Creatinine Clearance: 19.2 mL/min (A) (by C-G formula based on SCr of 2.74 mg/dL (H)).  Medical History: Past Medical History:  Diagnosis Date   Acute hypoxemic respiratory failure (HCC)    Anemia in stage 4 chronic kidney disease (HCC)    Anginal pain (HCC)    Anxiety    Aortic atherosclerosis (HCC)    CHF (congestive heart failure) (HCC)    a.) TTE 09/30/2021: EF 55-60%, no RWMAs, mild MR, G1DD   Chronic pain    Chronic radicular pain of lower back    CKD (chronic kidney disease), stage IV (HCC)    Complication of anesthesia    a.) delayed emergence following ureteroscopy 07/2023   Coronary artery calcification seen on CT scan    Costochondritis    DDD (degenerative disc disease), lumbar    Depression    Dyspnea    GERD (gastroesophageal reflux disease)    Hiatal hernia    Hip dysplasia    Hyperlipidemia    Hypertension    Insomnia    Iron deficiency    Iron deficiency anemia    Low back pain    Low vitamin B12 level    Lumbar facet joint pain    Migraines    Nose colonized with MRSA 10/03/2020    a.) preop PCR (+) 10/03/2020 prior to RIGHT TKA; b.) preop PCR (+) 08/25/2023 prior to RIGHT REVERSE SHOULDER ARTHROPLASTY; BICEPS TENODESIS   Numbness and tingling of right leg    OA (osteoarthritis)    Obesity    OSA (obstructive sleep apnea)    a.) not currently utilizing nocturnal PAP therapy; positional. PCCM feels as if symptom can be mitigated with diet/exercise/weight loss unless develops significant symptoms.   Osteoporosis    a.) recieves denosumab injections   Pelvic fracture (HCC)    Pneumonia    Pulmonary fibrosis (HCC)    Raynaud's disease without gangrene    Restless  leg syndrome    a.) on pramipexole + oral Fe supplementation   Rheumatoid arthritis (HCC)    a.) Tx'd with hydroxychloroquine   Right renal mass 05/28/2023   a.) CT renal 05/28/2023:  5.7 cm mass in the medial aspect of right kidney   Seasonal allergies    Thoracic compression fracture (HCC)    Medications:  No anticoagulation prior to admission per my chart review  Assessment: 72 y/o F with a past medical history of CKD IV, HFpEF, hypertension, RA, OSA not on CPAP who came to [facility] 12/6 for right reverse shoulder arthroplasty after for suspicion of septic joint. Patient experienced cardiac arrest post-operatively on 12/7. She is intubated, sedated and on mechanical ventilation in the ICU. Hospital course now further complicated by paroxysmal Afib with RVR. Patient has been placed on amiodarone. Pharmacy consulted for heparin.  Baseline aPTT and PT-INR are pending. CBC is notable for anemia which is chronic.  12/10 20:29 HL <0.10 12/11 0544 HL 0.13, subtherapeutic 12/11 1539 HL 0.29, subtherapeutic 12/12 0039 HL 0.34, therapeutic x 1 12/12 0920 HL 0.28, subtherapeutic 12/12 1817 HL 0.42, therapeutic x 1 12/13 0152 HL 0.39, therapeutic x 2  Goal of Therapy:  Heparin level 0.3-0.7 units/ml Monitor platelets by anticoagulation protocol: Yes   Plan:  --Continue heparin infusion at 1700 units/hr   --Recheck HL daily w/ AM labs while therapeutic --Daily CBC per protocol while on IV heparin  Otelia Sergeant, PharmD, [facility] 11/25/2023 2:34 AM

## 2023-11-25 NOTE — Evaluation (Signed)
Physical Therapy Evaluation Patient Details Name: Tonya Cummings MRN: [id number] DOB: 05/06/1951 Today's Date: 11/25/2023  History of Present Illness  Pt is a 72 y.o. female s/p 11/18/23 R reverse shoulder arthroplasty explant of glenoid and humeral components, open treatment of R humerus fx with intramedullary device, and R humerus insertion of antibiotic deliver device.  11/19/23 pt with PEA arrest (intubated and admitted to ICU); pt with R pneumothorax s/p chest tube placement 12/7; s/p HD catheter placement 12/7 d/t worsening kidney function.  Extubated 11/24/23.  PMH includes SOB, sleep apnea, htn, CAD, CHF, hiatal hernia, CKD stage 4, HFpEF, B12 deficiency, anemia, RA, Raynaud's phenomenon, OSA, L hip sx, R TKA, R reverse shoulder arthroplasty (s/p revision).  Clinical Impression  Prior to recent surgery, pt was ambulatory; lives with family in 1 level home with steps to enter (pt's daughter reports they can build a ramp if needed).  Pt appearing lethargic during session (pt able to briefly stay awake but then would fall back asleep) and accessory muscle use noted for breathing (nurse present and aware).  Pt able to move B LE's briefly before falling back asleep.  Deferred OOB mobility d/t pt's current status.  Pt would currently benefit from skilled PT to address noted impairments and functional limitations (see below for any additional details).  Upon hospital discharge, anticipate pt would benefit from ongoing therapy.     If plan is discharge home, recommend the following: Two people to help with walking and/or transfers;Two people to help with bathing/dressing/bathroom;Assistance with cooking/housework;Assist for transportation;Help with stairs or ramp for entrance;Supervision due to cognitive status   Can travel by private vehicle    No    Equipment Recommendations Other (comment) (TBD)  Recommendations for Other Services       Functional Status Assessment Patient has had a recent  decline in their functional status and demonstrates the ability to make significant improvements in function in a reasonable and predictable amount of time.     Precautions / Restrictions Precautions Precautions: Shoulder Type of Shoulder Precautions: NWB; shoulder immobilizer on except for ADLs/exercises Shoulder Interventions: Shoulder sling/immobilizer;At all times;Off for dressing/bathing/exercises Precaution Comments: L IJ temporary HD catheter Required Braces or Orthoses: Sling Restrictions Weight Bearing Restrictions Per Provider Order: Yes RUE Weight Bearing Per Provider Order: Non weight bearing      Mobility  Bed Mobility               General bed mobility comments: Deferred d/t pt unable to stay alert to safely attempt    Transfers                        Ambulation/Gait                  Stairs            Wheelchair Mobility     Tilt Bed    Modified Rankin (Stroke Patients Only)       Balance                                             Pertinent Vitals/Pain Pain Assessment Pain Assessment: Faces Faces Pain Scale: Hurts a little bit Pain Location: R shoulder Pain Descriptors / Indicators: Discomfort Pain Intervention(s): Limited activity within patient's tolerance, Monitored during session    Home Living Family/patient expects to be discharged to:: Private  residence Living Arrangements: Spouse/significant other;Children (Pt's husband, son, daughter, and 2 additional family members) Available Help at Discharge: Family;Available 24 hours/day Type of Home: Mobile home Home Access: Stairs to enter (can build a ramp if needed) Entrance Stairs-Rails: Can reach both;Right;Left Entrance Stairs-Number of Steps: 4   Home Layout: One level Home Equipment: Cane - single point;BSC/3in1;Rollator (4 wheels);Wheelchair - manual Additional Comments: Above information per pt's daughter    Prior Function Prior Level  of Function : Needs assist             Mobility Comments: Per OT eval, pt's husband has been assisting with ambulation and stair navigation.  Pt's daughter reports pt has been using [facility] for ambulation (but using rollator in community). ADLs Comments: Per OT eval, pt's husband has been helping with ADLs due to recenty shoulder surgeries     Extremity/Trunk Assessment   Upper Extremity Assessment Upper Extremity Assessment: Defer to OT evaluation RUE Deficits / Details: mild R hand swelling noted (nurse notified)    Lower Extremity Assessment Lower Extremity Assessment: Generalized weakness (L DF to approximately 10 degrees; R DF to approximately 15 degrees)    Cervical / Trunk Assessment Cervical / Trunk Assessment:  (chronic L lateral flexion neck positioning (chronic; pt was supposed to have surgery but had shoulder surgery first))  Communication   Communication Communication: Difficulty communicating thoughts/reduced clarity of speech;Difficulty following commands/understanding Following commands: Follows one step commands inconsistently (anticipate d/t pt's lethargy) Cueing Techniques: Verbal cues;Tactile cues  Cognition Arousal: Lethargic (Pt only able to briefly stay awake before falling back asleep.) Behavior During Therapy: Flat affect                                   General Comments: Pt did not verbalize during session but did nod head yes/no to questions.        General Comments  Nursing cleared pt for participation in physical therapy.  Pt's daughter present during session.  Ortho MD Allena Katz and PA [place] notified regarding question of imaging results for R shoulder positioning 11/19/23 (PA reporting no current concerns) and also that pt did not have R UE sling/immobilizer on when PT arrived (PA reporting pt had sling on earlier today in bed; mainly is about comfort for pt and is more important when pt is OOB).    Exercises     Assessment/Plan    PT  Assessment Patient needs continued PT services  PT Problem List Decreased strength;Decreased activity tolerance;Decreased range of motion;Decreased balance;Decreased mobility;Decreased cognition;Decreased knowledge of use of DME;Decreased knowledge of precautions;Cardiopulmonary status limiting activity;Decreased skin integrity;Pain       PT Treatment Interventions DME instruction;Gait training;Stair training;Functional mobility training;Therapeutic activities;Therapeutic exercise;Balance training;Patient/family education    PT Goals (Current goals can be found in the Care Plan section)  Acute Rehab PT Goals Patient Stated Goal: to improve functional mobility as able medically PT Goal Formulation: With family Time For Goal Achievement: 12/09/23 Potential to Achieve Goals: Fair    Frequency Min 1X/week     Co-evaluation               AM-PAC PT "6 Clicks" Mobility  Outcome Measure Help needed turning from your back to your side while in a flat bed without using bedrails?: Total Help needed moving from lying on your back to sitting on the side of a flat bed without using bedrails?: Total Help needed moving to and from a bed  to a chair (including a wheelchair)?: Total Help needed standing up from a chair using your arms (e.g., wheelchair or bedside chair)?: Total Help needed to walk in hospital room?: Total Help needed climbing 3-5 steps with a railing? : Total 6 Click Score: 6    End of Session Equipment Utilized During Treatment: Oxygen (40 L via HHFNC) Activity Tolerance: Patient limited by lethargy Patient left: in bed;with call bell/phone within reach;with bed alarm set;with nursing/sitter in room Nurse Communication: Precautions;Other (comment) (Pt's lethargy/accessory muscle use for breathing/pt's overall status/R UE sling concerns/imaging concerns for R shoulder) PT Visit Diagnosis: Other abnormalities of gait and mobility (R26.89);Muscle weakness (generalized)  (M62.81);Pain Pain - Right/Left: Right Pain - part of body:  (shoulder)    Time: 1610-9604 PT Time Calculation (min) (ACUTE ONLY): 16 min   Charges:   PT Evaluation $PT Eval Low Complexity: 1 Low   PT General Charges $$ ACUTE PT VISIT: 1 Visit       Hendricks Limes, PT 11/25/23, 5:42 PM

## 2023-11-25 NOTE — Progress Notes (Signed)
[facility]  ROUNDING NOTE   Subjective:   Tonya Cummings is a 72  y.o. female with past medical conditions including chronic kidney disease stage IV, proteinuria, hypertension, anemia of chronic kidney disease, RA, OSA and secondary hyperparathyroidism. Patient presented to the hospital for planned right shoulder arthroplasty which was later complicated by PEA cardiac arrest.  Patient is known to our practice and is followed outpatient by Dr. Cherylann Ratel.  Patient is seen and evaluated at bedside in ICU.  Patient alert and oriented, high flow nasal cannula in place.  Family at bedside.  Ill-appearing, appears uncomfortable, restless.  Heparin drip in place.  During this admission, creatinine has ranged between 2.5-2.96.  Most recent outpatient creatinine 2.08 on 10/04/2023.  We have been consulted to manage acute kidney injury.   Objective:  Vital signs in last 24 hours:  Temp:  [97.7 F (36.5 C)-98.6 F (37 C)] 97.9 F (36.6 C) (12/13 0400) Pulse Rate:  [74-104] 98 (12/13 1000) Resp:  [12-31] 23 (12/13 1000) BP: (93-161)/(41-104) 142/65 (12/13 1000) SpO2:  [93 %-100 %] 99 % (12/13 1000) FiO2 (%):  [30 %-40 %] 40 % (12/13 0845)  Weight change:  Filed Weights   11/21/23 0500 11/22/23 0459 11/23/23 0500  Weight: 78.6 kg 80.8 kg 80.4 kg    Intake/Output: I/O last 3 completed shifts: In: 2285.3 [I.V.:983; NG/GT:810; IV Piggyback:492.3] Out: 3070 [Urine:3070]   Intake/Output this shift:  No intake/output data recorded.  Physical Exam: General: Ill appearing, restless  Head: Normocephalic, atraumatic. Moist oral mucosal membranes  Eyes: Anicteric  Lungs:  Rhonchus, coarse, HFNC  Heart: Regular rate and rhythm  Abdomen:  Soft, nontender  Extremities:  No peripheral edema.  Neurologic: Nonfocal, moving all four extremities  Skin: No lesions       Basic Metabolic Panel: Recent Labs  Lab 11/21/23 0402 11/22/23 0407 11/23/23 0544 11/24/23 0448  11/25/23 0152  NA 137 140 136 137 137  K 3.7 3.9 3.9 4.5 4.6  CL 104 104 101 104 103  CO2 25 21* 22 25 20*  GLUCOSE 107* 96 155* 146* 92  BUN 67* 72* 76* 84* 90*  CREATININE 2.66* 2.96* 2.88* 2.61* 2.74*  CALCIUM 8.6* 8.7* 8.6* 8.5* 8.9  MG 2.0 2.0 2.0 2.1 2.1  PHOS 4.0 5.2* 4.1 3.4 5.0*    Liver Function Tests: Recent Labs  Lab 11/19/23 1310 11/19/23 2039 11/20/23 0252 11/21/23 0402 11/22/23 0407  AST 96* 122* 102* 51* 36  ALT 55* 54* 41 20 16  ALKPHOS 135* 124 115 120 152*  BILITOT 0.9 0.6 0.7 0.8 0.9  PROT 6.9 6.7 6.3* 6.1* 6.0*  ALBUMIN 2.8* 2.6* 2.6* 2.4* 2.1*   No results for input(s): "LIPASE", "AMYLASE" in the last 168 hours. No results for input(s): "AMMONIA" in the last 168 hours.  CBC: Recent Labs  Lab 11/19/23 0216 11/19/23 1310 11/19/23 2039 11/21/23 0402 11/22/23 0407 11/23/23 0544 11/24/23 0448 11/25/23 0152  WBC 8.6 14.8*   < > 15.3* 14.6* 8.8 8.8 11.0*  NEUTROABS 7.1 9.3*  --   --   --   --   --   --   HGB 7.1* 8.7*   < > 7.6* 7.4* 7.2* 7.5* 8.6*  HCT 23.4* 29.1*   < > 23.8* 23.4* 22.8* 23.9* 28.3*  MCV 92.9 96.7   < > 89.5 92.9 91.2 89.2 93.7  PLT 304 334   < > 251 230 227 234 274   < > = values in this interval not displayed.  Cardiac Enzymes: Recent Labs  Lab 11/19/23 0216 11/19/23 1310 11/19/23 2039 11/23/23 0544  CKTOTAL 108 134 291* 35*    BNP: Invalid input(s): "POCBNP"  CBG: Recent Labs  Lab 11/24/23 1628 11/24/23 1948 11/25/23 0020 11/25/23 0359 11/25/23 0800  GLUCAP 103* 96 83 73 64*    Microbiology: Results for orders placed or performed during the hospital encounter of 11/18/23  Surgical pcr screen     Status: Abnormal   Collection Time: 11/18/23 12:11 PM   Specimen: Nasal Mucosa; Nasal Swab  Result Value Ref Range Status   MRSA, PCR NEGATIVE NEGATIVE Final   Staphylococcus aureus POSITIVE (A) NEGATIVE Final    Comment: (NOTE) The Xpert SA Assay (FDA approved for NASAL specimens in patients 22 years  of age and older), is one component of a comprehensive surveillance program. It is not intended to diagnose infection nor to guide or monitor treatment. Performed at [facility], [address] [place], [place] [address]   Aerobic/Anaerobic Culture w Gram Stain (surgical/deep wound)     Status: None (Preliminary result)   Collection Time: 11/18/23  2:29 PM   Specimen: Path Tissue  Result Value Ref Range Status   Specimen Description   Final    TISSUE Performed at [facility], [address] [place], [place] [address]    Special Requests RT HUMERUS HOLD 21DAYS  Final   Gram Stain   Final    RARE WBC PRESENT,BOTH PMN AND MONONUCLEAR NO ORGANISMS SEEN    Culture   Final    NO GROWTH 7 DAYS CONTINUING TO HOLD Performed at [facility], [address], [place], [place] [address]    Report Status PENDING  Incomplete  Aerobic/Anaerobic Culture w Gram Stain (surgical/deep wound)     Status: None (Preliminary result)   Collection Time: 11/18/23  2:43 PM   Specimen: Path Tissue  Result Value Ref Range Status   Specimen Description   Final    TISSUE Performed at [facility], [address] [place], [place] [address]    Special Requests TUBEROSITY,HOLD 21DAYS  Final   Gram Stain   Final    FEW WBC PRESENT,BOTH PMN AND MONONUCLEAR NO ORGANISMS SEEN    Culture   Final    NO GROWTH 7 DAYS CONTINUING TO HOLD Performed at [facility], [address] [place], [place] [address]    Report Status PENDING  Incomplete  Aerobic/Anaerobic Culture w Gram Stain (surgical/deep wound)     Status: None (Preliminary result)   Collection Time: 11/18/23  2:52 PM   Specimen: Path Tissue  Result Value Ref Range Status   Specimen Description   Final    TISSUE Performed at [facility], [address] [place], [place] [address]    Special Requests RT HUMERAL TRAY HOLD 21 DAYS  Final   Gram Stain   Final    RARE WBC PRESENT, PREDOMINANTLY  MONONUCLEAR NO ORGANISMS SEEN    Culture   Final    NO GROWTH 7 DAYS CONTINUING TO HOLD Performed at [facility], [address], [place], [place] [address]    Report Status PENDING  Incomplete  Aerobic/Anaerobic Culture w Gram Stain (surgical/deep wound)     Status: None (Preliminary result)   Collection Time: 11/18/23  2:58 PM   Specimen: Path Tissue  Result Value Ref Range Status   Specimen Description   Final    TISSUE Performed at [facility], [address] [place], [place] [address]    Special Requests GLENOSPHERE,HOLD 21 DAYS  Final   Gram Stain   Final    RARE WBC PRESENT,BOTH PMN AND MONONUCLEAR NO ORGANISMS SEEN    Culture   Final    NO GROWTH 7 DAYS CONTINUING TO HOLD Performed at [facility], [address], [place], [place] [address]    Report Status PENDING  Incomplete  Aerobic/Anaerobic Culture w Gram Stain (surgical/deep wound)     Status: None (Preliminary result)   Collection Time: 11/18/23  3:06 PM   Specimen: Path Tissue  Result Value Ref Range Status   Specimen Description   Final    TISSUE Performed at [facility], [address] [place], [place] [address]    Special Requests RT HUMEREUS CANAL HOLD 21DAYS  Final   Gram Stain   Final    FEW WBC PRESENT, PREDOMINANTLY MONONUCLEAR NO ORGANISMS SEEN    Culture   Final    NO GROWTH 7 DAYS CONTINUING TO HOLD Performed at [facility], [address] [place], [place] [address]    Report Status PENDING  Incomplete  Culture, blood (Routine X 2) w Reflex to ID Panel     Status: None   Collection Time: 11/18/23  7:22 PM   Specimen: BLOOD  Result Value Ref Range Status   Specimen Description BLOOD BLOOD LEFT HAND  Final   Special Requests   Final    BOTTLES DRAWN AEROBIC AND ANAEROBIC Blood Culture adequate volume   Culture   Final    NO GROWTH 5 DAYS Performed at [facility], [address] [place], [place] [address]    Report Status 11/23/2023  FINAL  Final  Culture, blood (Routine X 2) w Reflex to ID Panel     Status: None   Collection Time: 11/18/23  7:28 PM   Specimen: BLOOD  Result Value Ref Range Status   Specimen Description BLOOD BLOOD LEFT ARM  Final   Special Requests   Final    BOTTLES DRAWN AEROBIC AND ANAEROBIC Blood Culture adequate volume   Culture   Final    NO GROWTH 5 DAYS Performed at [facility], [address] [place], [place] [address]    Report Status 11/23/2023 FINAL  Final  MRSA Next Gen by PCR, Nasal     Status: None   Collection Time: 11/19/23  1:57 PM   Specimen: Nasal Mucosa; Nasal Swab  Result Value Ref Range Status   MRSA by PCR Next Gen NOT DETECTED NOT DETECTED Final    Comment: (NOTE) The GeneXpert MRSA Assay (FDA approved for NASAL specimens only), is one component of a comprehensive MRSA colonization surveillance program. It is not intended to diagnose MRSA infection nor to guide or monitor treatment for MRSA infections. Test performance is not FDA approved in patients less than 83 years old. Performed at [facility], [address] [place], [place] [address]   Culture, Respiratory w Gram Stain     Status: None   Collection Time: 11/21/23  3:18 PM   Specimen: Tracheal Aspirate; Respiratory  Result Value Ref Range Status   Specimen Description   Final    TRACHEAL ASPIRATE Performed at [facility], [address], [place], [place] [address]    Special Requests   Final    NONE Performed at [facility], [address] [place], [place] [address]    Gram Stain   Final    ABUNDANT  WBC PRESENT, PREDOMINANTLY PMN RARE GRAM POSITIVE COCCI IN PAIRS RARE GRAM NEGATIVE RODS    Culture   Final    FEW Normal respiratory flora-no Staph aureus or Pseudomonas seen Performed at [facility], [address] [place], [place] [address]    Report Status 11/23/2023 FINAL  Final    Coagulation Studies: Recent Labs    11/22/23 1247  LABPROT  16.8*  INR 1.3*    Urinalysis: No results for input(s): "COLORURINE", "LABSPEC", "PHURINE", "GLUCOSEU", "HGBUR", "BILIRUBINUR", "KETONESUR", "PROTEINUR", "UROBILINOGEN", "NITRITE", "LEUKOCYTESUR" in the last 72 hours.  Invalid input(s): "APPERANCEUR"    Imaging: DG Chest Port 1 View Result Date: 11/24/2023 CLINICAL DATA:  [id number] with acute hypoxic respiratory failure. EXAM: PORTABLE CHEST 1 VIEW COMPARISON:  Portable chest yesterday at 3:16 p.m. FINDINGS: 4:30 a.m. Left IJ dialysis catheter terminates in the right atrium slightly above the inferior cavoatrial junction. Interval removal pigtail right chest tube. No measurable pneumothorax. ETT tip is 3.3 cm from the carina, NGT tip is in the gastric antrum. Multiple overlying monitor wires. Stable cardiomegaly. There is mild central vascular congestion but with improvement. Mediastinal configuration is stable. There is patchy airspace disease in the right lower lung field with no interval improvement or worsening. Left lung is clear. There is no substantial pleural effusion. No new osseous findings. IMPRESSION: 1. Interval removal of right chest tube. No measurable pneumothorax. 2. Patchy airspace disease in the right lower lung field with no interval improvement or worsening. 3. Cardiomegaly with mild central vascular congestion but with improvement. Electronically Signed   By: Almira Bar M.D.   On: 11/24/2023 06:38   DG Chest Port 1 View Result Date: 11/23/2023 CLINICAL DATA:  Pneumothorax. EXAM: PORTABLE CHEST 1 VIEW COMPARISON:  Earlier radiograph dated 11/23/2023. FINDINGS: Support lines and tubes in similar position. Interval progression bilateral pulmonary opacities. No large pleural effusion. No pneumothorax. Stable cardiac silhouette. No acute osseous pathology. IMPRESSION: 1. Interval progression of bilateral pulmonary opacities. 2. No pneumothorax. Electronically Signed   By: Elgie Collard M.D.   On: 11/23/2023 16:24      Medications:    acetaminophen 1,000 mg (11/25/23 0933)   cefTRIAXone (ROCEPHIN)  IV Stopped (11/24/23 1851)   DAPTOmycin Stopped (11/24/23 1456)   heparin 1,700 Units/hr (11/25/23 0600)    aspirin  325 mg Per Tube Daily   calcitRIOL  0.25 mcg Per Tube q morning   calcium-vitamin D  1 tablet Per Tube TID   Chlorhexidine Gluconate Cloth  6 each Topical Daily   cholecalciferol  1,000 Units Per Tube q morning   cyanocobalamin  1,000 mcg Per Tube q morning   ferrous sulfate  300 mg Per Tube Daily   lidocaine  1 patch Transdermal Q24H   mupirocin ointment   Topical BID   pantoprazole (PROTONIX) IV  40 mg Intravenous Daily   pramipexole  0.125 mg Per Tube q morning   And   pramipexole  0.25 mg Per Tube QHS   sodium chloride flush  3 mL Intravenous Q12H   sodium chloride flush  3 mL Intravenous Q12H   sodium chloride HYPERTONIC  4 mL Nebulization BID   bisacodyl, HYDROmorphone (DILAUDID) injection, ipratropium-albuterol, mouth rinse, mouth rinse, mouth rinse, oxyCODONE, polyethylene glycol, senna-docusate  Assessment/ Plan:  Ms. MANAIA ACHTER is a 72 y.o.  female with past medical conditions including chronic kidney disease stage IV, proteinuria, hypertension, anemia of chronic kidney disease, RA, OSA and secondary hyperparathyroidism. Patient presented to the hospital for planned right  shoulder arthroplasty which was later complicated by PEA cardiac arrest.  She is currently admitted for Periprosthetic fracture around internal prosthetic right shoulder joint [M97.31XA] Septic arthritis (HCC) [M00.9]   Acute Kidney Injury on chronic kidney disease stage 4 with baseline creatinine 2.08 on 10/04/2023.  Acute kidney injury secondary to hypoperfusion.  Chronic kidney disease secondary to hypertension and rheumatoid arthritis with prior NSAID use.  No IV contrast exposure.  We feel renal function should improve as patient stabilizes.  Continue to avoid hypotension and other nephrotoxic  agents and therapies.  No acute indication for dialysis.   Lab Results  Component Value Date   CREATININE 2.74 (H) 11/25/2023   CREATININE 2.61 (H) 11/24/2023   CREATININE 2.88 (H) 11/23/2023    Intake/Output Summary (Last 24 hours) at 11/25/2023 1104 Last data filed at 11/25/2023 0600 Gross per 24 hour  Intake 906.91 ml  Output 1695 ml  Net -788.09 ml   2. Anemia of chronic kidney disease with acute blood loss Lab Results  Component Value Date   HGB 8.6 (L) 11/25/2023  Patient has received blood transfusions during this admission. Hemoglobin just below desired target.  Will monitor for now.  3. Secondary Hyperparathyroidism: with outpatient labs: PTH 45, phosphorus 4.7, calcium 9.4 on 11/07/23.   Lab Results  Component Value Date   CALCIUM 8.9 11/25/2023   PHOS 5.0 (H) 11/25/2023    Will continue to monitor bone minerals during this admission.  Continue calcitriol.     LOS: 7 Tonya Cummings 12/13/202411:04 AM

## 2023-11-25 NOTE — Plan of Care (Signed)
  Problem: Health Behavior/Discharge Planning: Goal: Ability to manage health-related needs will improve Outcome: Progressing   Problem: Activity: Goal: Risk for activity intolerance will decrease Outcome: Not Progressing   Problem: Nutrition: Goal: Adequate nutrition will be maintained Outcome: Not Progressing

## 2023-11-25 NOTE — Progress Notes (Signed)
PHARMACY CONSULT NOTE  Pharmacy Consult for Electrolyte Monitoring and Replacement   Recent Labs: Potassium (mmol/L)  Date Value  11/25/2023 4.6   Magnesium (mg/dL)  Date Value  62/95/2841 2.1   Calcium (mg/dL)  Date Value  32/44/0102 8.9   Albumin (g/dL)  Date Value  72/53/6644 2.1 (L)   Phosphorus (mg/dL)  Date Value  03/47/4259 5.0 (H)   Sodium (mmol/L)  Date Value  11/25/2023 137  09/12/2023 142   Assessment: 72 y.o. female with medical history significant of CKD Stage 4, HFpEF, HTN, B12 deficiency, anemia of CKD, rheumatoid arthritis, Raynaud's phenomenon, OSA not on CPAP, who was admitted on 12/6 for recurrent R shoulder revision following arthroplasty.   Goal of Therapy:  K >= 4, Mg >= 2  Plan:  --No electrolyte replacement indicated at this time --Labs tomorrow  Tressie Ellis 11/25/2023 7:42 AM

## 2023-11-25 NOTE — Progress Notes (Signed)
Date of Admission:  11/18/2023      ID: Tonya Cummings is a 72 y.o. female Principal Problem:   Periprosthetic fracture around internal prosthetic right shoulder joint Active Problems:   Hypertension   Seropositive rheumatoid arthritis (HCC)   Acute respiratory failure with hypoxia (HCC)   Septic arthritis (HCC)   Acute on chronic anemia   (HFpEF) heart failure with preserved ejection fraction (HCC)   CKD (chronic kidney disease) stage 4, GFR 15-29 ml/min (HCC)   ILD (interstitial lung disease) (HCC)   Tension pneumothorax   Cardiac arrest, cause unspecified (HCC)    Subjective: Remains extubated Responds to simple commands  Medications:   aspirin  325 mg Per Tube Daily   calcitRIOL  0.25 mcg Per Tube q morning   calcium-vitamin D  1 tablet Per Tube TID   Chlorhexidine Gluconate Cloth  6 each Topical Daily   cholecalciferol  1,000 Units Per Tube q morning   cyanocobalamin  1,000 mcg Per Tube q morning   ferrous sulfate  300 mg Per Tube Daily   lidocaine  1 patch Transdermal Q24H   mupirocin ointment   Topical BID   pramipexole  0.125 mg Per Tube q morning   And   pramipexole  0.25 mg Per Tube QHS   sodium chloride flush  3 mL Intravenous Q12H   sodium chloride flush  3 mL Intravenous Q12H   sodium chloride HYPERTONIC  4 mL Nebulization BID    Objective: Vital signs in last 24 hours: Patient Vitals for the past 24 hrs:  BP Temp Temp src Pulse Resp SpO2  11/25/23 1300 (!) 132/58 -- -- 81 (!) 25 100 %  11/25/23 1200 132/76 -- -- 88 (!) 25 100 %  11/25/23 1100 124/70 -- -- 81 19 99 %  11/25/23 1000 (!) 142/65 -- -- 98 (!) 23 99 %  11/25/23 0900 (!) 156/62 -- -- 92 (!) 25 97 %  11/25/23 0800 (!) 149/63 -- -- (!) 104 (!) 27 100 %  11/25/23 0700 (!) 150/64 -- -- 87 16 100 %  11/25/23 0639 -- -- -- 90 (!) 25 99 %  11/25/23 0600 (!) 144/62 -- -- 87 19 100 %  11/25/23 0500 (!) 160/63 -- -- 91 (!) 22 100 %  11/25/23 0446 -- -- -- 80 17 98 %  11/25/23 0400 (!) 147/66  97.9 F (36.6 C) Axillary 92 (!) 27 98 %  11/25/23 0300 (!) 115/59 -- -- 89 (!) 22 99 %  11/25/23 0200 93/80 -- -- 88 (!) 24 93 %  11/25/23 0100 (!) 161/73 -- -- 75 (!) 27 100 %  11/25/23 0035 -- -- -- 86 20 98 %  11/25/23 0000 135/60 97.7 F (36.5 C) Axillary 87 (!) 22 97 %  11/24/23 2300 (!) 146/60 -- -- 88 (!) 23 98 %  11/24/23 2200 (!) 151/60 -- -- 85 (!) 24 99 %  11/24/23 2100 (!) 140/59 -- -- 83 (!) 23 98 %  11/24/23 2000 125/63 97.9 F (36.6 C) -- 87 (!) 27 99 %  11/24/23 1948 -- -- -- -- -- 97 %  11/24/23 1900 (!) 114/51 -- -- 89 (!) 24 98 %  11/24/23 1830 (!) 115/45 -- -- 81 17 97 %  11/24/23 1800 (!) 118/52 -- -- 89 20 97 %  11/24/23 1730 (!) 122/54 -- -- 88 19 98 %  11/24/23 1700 (!) 125/50 -- -- 92 (!) 22 97 %  11/24/23 1634 -- -- -- 93 Marland Kitchen)  21 96 %  11/24/23 1630 137/63 97.8 F (36.6 C) Oral 80 (!) 28 99 %  11/24/23 1600 (!) 115/55 -- -- 93 (!) 25 97 %  11/24/23 1530 118/60 -- -- 92 (!) 23 97 %  11/24/23 1510 -- -- -- 98 (!) 31 97 %  11/24/23 1500 (!) 135/58 -- -- 96 (!) 23 98 %  11/24/23 1430 (!) 142/55 -- -- 96 (!) 28 98 %      PHYSICAL EXAM:  General: [place] Head: Normocephalic, without obvious abnormality, atraumatic. Rt torticollis Eyes: Conjunctivae clear, anicteric sclerae. Pupils are equal ENT cannot be examined Neck:  symmetrical, no adenopathy, thyroid: non tender no carotid bruit and no JVD. Lungs:decreased air entry rt side Chest drain. Heart: tachycardia Abdomen: Soft, non-tender,not distended.  Extremities: rt shoulder surgical site in sling/dressing Skin: some bruising over chest Lymph: Cervical, supraclavicular normal. Neurologic: cannot be assessed Lab Results    Latest Ref Rng & Units 11/25/2023    1:52 AM 11/24/2023    4:48 AM 11/23/2023    5:44 AM  CBC  WBC 4.0 - 10.5 K/uL 11.0  8.8  8.8   Hemoglobin 12.0 - 15.0 g/dL 8.6  7.5  7.2   Hematocrit 36.0 - 46.0 % 28.3  23.9  22.8   Platelets 150 - 400 K/uL 274  234  227        Latest  Ref Rng & Units 11/25/2023    1:52 AM 11/24/2023    4:48 AM 11/23/2023    5:44 AM  CMP  Glucose 70 - 99 mg/dL 92  706  237   BUN 8 - 23 mg/dL 90  84  76   Creatinine 0.44 - 1.00 mg/dL 6.28  3.15  1.76   Sodium 135 - 145 mmol/L 137  137  136   Potassium 3.5 - 5.1 mmol/L 4.6  4.5  3.9   Chloride 98 - 111 mmol/L 103  104  101   CO2 22 - 32 mmol/L 20  25  22    Calcium 8.9 - 10.3 mg/dL 8.9  8.5  8.6       Microbiology: Surgical culutres neg so far Studies/Results: DG Chest Port 1 View Result Date: 11/24/2023 CLINICAL DATA:  [id number] with acute hypoxic respiratory failure. EXAM: PORTABLE CHEST 1 VIEW COMPARISON:  Portable chest yesterday at 3:16 p.m. FINDINGS: 4:30 a.m. Left IJ dialysis catheter terminates in the right atrium slightly above the inferior cavoatrial junction. Interval removal pigtail right chest tube. No measurable pneumothorax. ETT tip is 3.3 cm from the carina, NGT tip is in the gastric antrum. Multiple overlying monitor wires. Stable cardiomegaly. There is mild central vascular congestion but with improvement. Mediastinal configuration is stable. There is patchy airspace disease in the right lower lung field with no interval improvement or worsening. Left lung is clear. There is no substantial pleural effusion. No new osseous findings. IMPRESSION: 1. Interval removal of right chest tube. No measurable pneumothorax. 2. Patchy airspace disease in the right lower lung field with no interval improvement or worsening. 3. Cardiomegaly with mild central vascular congestion but with improvement. Electronically Signed   By: Almira Bar M.D.   On: 11/24/2023 06:38   DG Chest Port 1 View Result Date: 11/23/2023 CLINICAL DATA:  Pneumothorax. EXAM: PORTABLE CHEST 1 VIEW COMPARISON:  Earlier radiograph dated 11/23/2023. FINDINGS: Support lines and tubes in similar position. Interval progression bilateral pulmonary opacities. No large pleural effusion. No pneumothorax. Stable cardiac  silhouette. No acute osseous pathology. IMPRESSION: 1. Interval progression  of bilateral pulmonary opacities. 2. No pneumothorax. Electronically Signed   By: Elgie Collard M.D.   On: 11/23/2023 16:24     Assessment/Plan: Rt  shoulder reverse arthroplasty Prosthetic joint infection Explanation of hardware on 11/18/23 Multiple cultures sent Pt on daptomycin and ceftriaxone- continue- will de-esclatae depending on culture result   Cardiac arrest secondary to acute resp failure Pt extubated 2 d echo No RV strain   Tension Pneumothorax post CPR Has rt chest drain   Iron def anemia   CKD   Rheumatoid arthritis   Rt renal mass .   Discussed the management with care team ID will follow her peripherally this weekend Call the on call physician if needed

## 2023-11-25 NOTE — Progress Notes (Signed)
Nutrition Follow-up  DOCUMENTATION CODES:   Not applicable  INTERVENTION:   -RD will follow for diet advancement and add supplements as appropriate  -If unable to take PO's, may need to consider initiation of enteral nutrition support:  Initiate Osmolite 1.2 @ 20 ml/hr and increase by 10 ml every 4 hours to goal rate of 60 ml/hr.   80 ml free water flush every 4 hours  Tube feeding regimen provides 1728 kcal (100% of needs), 80 grams of protein, and 1181 ml of H2O.  Total free water: 1661 ml daily  NUTRITION DIAGNOSIS:   Inadequate oral intake related to inability to eat (pt sedated and ventilated) as evidenced by NPO status.  Ongoing  GOAL:   Patient will meet greater than or equal to 90% of their needs  Unmet  MONITOR:   Diet advancement  REASON FOR ASSESSMENT:   Ventilator    ASSESSMENT:   72 y/o female with h/o hiatal hernia, GERD, RLS, OSA, IDA, depression, anxiety, HLD, HTN, PAF, CKD IV, CHF, ILD, DDD, RA, right total knee arthroplasty (2021) and renal cysts who is admitted with septic joint s/p right reverse shoulder arthroplasty 12/6 complicated by PEA arrest, AKI, NSTEMI and pneumothorax 12/7.  12/9- TF initiated 12/10- TF stopped secondary to emesis, NGT placed to low, intermittent suction 12/11 chest tube removed 12/12- NGT removed, extubated to [facility]  Reviewed I/O's: -1.3 L x 24 hours and +558 ml since admission  UOP: 2.5 L x 24 hours  Nephrology following for AKI; anticipate improvement in renal function as pt stabilizes.   Pt sitting up in bed, sleeping soundly with HHFNC. No family at bedside.   Pt is currently NPO; noted orders for SLP evaluation.   No wt loss since admission.   Medications reviewed and include vitamin D3, vitamin B-12, ferrous sulfate, and rocephin.   Labs reviewed: CBGS: 64-103.    Diet Order:   Diet Order             Diet NPO time specified  Diet effective now                   EDUCATION NEEDS:   No  education needs have been identified at this time  Skin:  Skin Assessment: Skin Integrity Issues: Skin Integrity Issues:: Incisions Incisions: clsoed rt shoulder  Last BM:  11/24/23  Height:   Ht Readings from Last 1 Encounters:  11/21/23 5\' 3"  (1.6 m)    Weight:   Wt Readings from Last 1 Encounters:  11/23/23 80.4 kg    Ideal Body Weight:  52 kg  BMI:  Body mass index is 31.4 kg/m.  Estimated Nutritional Needs:   Kcal:  1650-1850  Protein:  80-95 grams  Fluid:  > 1.6 L    Levada Schilling, RD, LDN, CDCES Registered Dietitian III Certified Diabetes Care and Education Specialist If unable to reach this RD, please use "RD Inpatient" group chat on secure chat between hours of 8am-4 pm daily

## 2023-11-25 NOTE — Progress Notes (Signed)
Attempted CPT with patient. Pt asked me to stop the bed as she was in too much pain.

## 2023-11-25 NOTE — Progress Notes (Signed)
NAME:  TRINH CHARLESTON, MRN:  [id number], DOB:  09-21-1951, LOS: 7 ADMISSION DATE:  11/18/2023, CHIEF COMPLAINT:  Septic Joint  History of Present Illness:   This is a case of a 72 year old female patient with a past medical history of CKD stage IV, heart failure with preserved EF, hypertension, rheumatoid arthritis, OSA not on CPAP presents to [facility] on 12/06 for right reverse shoulder arthroplasty after for suspicion of septic joint.  She initially underwent right shoulder arthroplasty on 08/30/2023 after which she developed a.  Periprosthetic fracture status post conversion to a longstem humeral component with ORIF on 10/03/2023.  No evidence of infection at the time.  Follow-up lab work showed elevated inflammatory markers including ESR suspicious for an infectious process.  Therefore she presents back on 12/06 for shoulder arthroplasty explant of glenoid and humeral component open treatment of right humerus fracture with intramedullary device and insertion of antibiotic delivery system.  Small area of purulence was noted.  Cultures were sent.  Started on daptomycin and ceftriaxone.  She did well immediately postop however on 12/07 she was getting up to the bedside commode she went into respiratory distress with O2 sat dropping becoming apneic and went into PEA arrest.  CPR for roughly 5 to 6-minute prior to ROSC epi x 2.  Not shockable rhythm.  Patient intubated and transferred to the ICU for further care.  Postintubation chest x-ray with right pneumothorax status post chest tube placement 12/07.  HD catheter placed 12/07 anticipating need for dialysis given worsening kidney function.  Echocardiogram 12/08 with normal LVEF 55 to 60%.  RV systolic function is normal and size is normal.  Ultrasound venous lower extremity 12/08 negative for DVT.  Pertinent  Medical History  RA on Hydoxychloroquine HTN HFpEF  Micro Data:  Surgical pcr screen 12/6: positive for  staph aureus  Blood x2 12/6: negative  Right humerus wound 12/6: NGTD Right tuberosity 12/6: NGTD Right glenosphere 12/6: NGTD  MRSA PCR 12/7: negative  Resp 12/9: few normal respiratory flora-no Staph aureus or Pseudomonas seen   Anti-infectives (From admission, onward)    Start     Dose/Rate Route Frequency Ordered Stop   11/23/23 1800  cefTRIAXone (ROCEPHIN) 2 g in sodium chloride 0.9 % 100 mL IVPB        2 g 200 mL/hr over 30 Minutes Intravenous Every 24 hours 11/23/23 1711     11/22/23 1400  DAPTOmycin (CUBICIN) IVPB 700 mg/176mL premix        700 mg 200 mL/hr over 30 Minutes Intravenous Every 48 hours 11/21/23 1609     11/22/23 1100  ceFEPIme (MAXIPIME) 2 g in sodium chloride 0.9 % 100 mL IVPB  Status:  Discontinued        2 g 200 mL/hr over 30 Minutes Intravenous Every 24 hours 11/22/23 1009 11/23/23 1711   11/19/23 2245  hydroxychloroquine (PLAQUENIL) tablet 200 mg  Status:  Discontinued        200 mg Per Tube 2 times daily 11/19/23 2152 11/20/23 1041   11/19/23 1800  DAPTOmycin (CUBICIN) IVPB 500 mg/12mL premix  Status:  Discontinued        6 mg/kg  77.1 kg 100 mL/hr over 30 Minutes Intravenous Every 48 hours 11/18/23 1935 11/18/23 1943   11/18/23 2200  hydroxychloroquine (PLAQUENIL) tablet 200 mg  Status:  Discontinued        200 mg Oral 2 times daily 11/18/23 2003 11/19/23 2152   11/18/23 2100  ceFAZolin (ANCEF) IVPB 2g/100  mL premix  Status:  Discontinued        2 g 200 mL/hr over 30 Minutes Intravenous Every 6 hours 11/18/23 2003 11/18/23 2021   11/18/23 2000  DAPTOmycin (CUBICIN) IVPB 500 mg/33mL premix  Status:  Discontinued        6 mg/kg  77.1 kg 100 mL/hr over 30 Minutes Intravenous Every 48 hours 11/18/23 1906 11/18/23 1935   11/18/23 2000  DAPTOmycin (CUBICIN) IVPB 500 mg/17mL premix  Status:  Discontinued        6 mg/kg  77.1 kg 100 mL/hr over 30 Minutes Intravenous Every 48 hours 11/18/23 1943 11/21/23 1609   11/18/23 1930  cefTRIAXone (ROCEPHIN) 2 g in  sodium chloride 0.9 % 100 mL IVPB  Status:  Discontinued        2 g 200 mL/hr over 30 Minutes Intravenous Every 24 hours 11/18/23 1832 11/22/23 1009   11/18/23 1602  gentamicin (GARAMYCIN) injection  Status:  Discontinued          As needed 11/18/23 1632 11/18/23 1705   11/18/23 1600  tobramycin (NEBCIN) powder  Status:  Discontinued          As needed 11/18/23 1631 11/18/23 1705   11/18/23 1528  vancomycin (VANCOCIN) powder  Status:  Discontinued          As needed 11/18/23 1628 11/18/23 1705   11/18/23 0600  ceFAZolin (ANCEF) IVPB 2g/100 mL premix        2 g 200 mL/hr over 30 Minutes Intravenous On call to O.R. 11/17/23 2319 11/18/23 1527      Significant Hospital Events: Including procedures, antibiotic start and stop dates in addition to other pertinent events   12/06 admitted for arthroplasty revision explant and insertion of antibiotic device. 12/7 PEA arrest intubated and admitted to the ICU.  Chest x-ray with right pneumothorax status post chest tube placement 12/07.  Status post HD catheter placement 12/07 with worsening kidney function. 12/08 remains intubated and sedated.  12/09 failed SBT due to hypoxia, tolerated PSV during the day 12/10 failed SBT due to low tidal volumes, hypoxia 12/11 Pt remains mechanically intubated on minimal vent settings; performed SBT unable to tolerate PS 5/5 due to hypoxia and low tidal volumes; currently tolerating PS 12/5.  Right sided chest tube removed repeat CXR post removal negative for pneumothorax  12/12: Pt remains mechanically intubated on minimal vent settings.  Will perform WUA and SBT today ~ EXTUBATED 12/13: Respiratory status tenuous with weak cough and pain, needs aggressive pulmonary toilet.  Speech/PT/OT consulted.  If unable to pass speech evaluation, will have nursing NGT tube meds and feeds.  Interim History / Subjective:  -No significant events noted overnight -Extubated yesterday, respiratory status remains tenuous with weak  cough and difficulty with deep inspiration due to pain from shoulder and sternum post CPR ~ high risk for reintubation due to weakness -Needs aggressive pulmonary toilet and OOB as able ~ will consult PT/OT -Consult Speech Therapy for swallow evaluation ~ If unable to pass Speech evaluation, will have nursing place NGT for med and feeds -Continue with scheduled IV tylenol, will change Fentanyl to Dilaudid for pain control -Creatinine fairly stable at 2.7 from 2.6, UOP 2.5 L last 24 hrs (net + 500 cc), electrolytes acceptable   Objective   Blood pressure (!) 144/62, pulse 90, temperature 97.9 F (36.6 C), temperature source Axillary, resp. rate (!) 25, height 5\' 3"  (1.6 m), weight 80.4 kg, SpO2 99%.    Vent Mode: PSV FiO2 (%):  [  30 %-40 %] 32 % PEEP:  [5 cmH20] 5 cmH20 Pressure Support:  [5 cmH20-12 cmH20] 5 cmH20   Intake/Output Summary (Last 24 hours) at 11/25/2023 0749 Last data filed at 11/25/2023 0600 Gross per 24 hour  Intake 1187.88 ml  Output 2520 ml  Net -1332.12 ml   Filed Weights   11/21/23 0500 11/22/23 0459 11/23/23 0500  Weight: 78.6 kg 80.8 kg 80.4 kg    Examination: General: Acutely on chronically-ill appearing female, sitting in bed, on HHFNC, with moderate pain in right should and over sternum, but NAD HENT: Supple, no JVD, dry MM Lungs: Diffuse rhonchi throughout, even, non labored  Cardiovascular: NSR, s1s2, no r/g, 2+ radial/2+ distal pulses, no edema  Abdomen: +BS x4, soft, non tender, non distended  Extremities: RUE ROM limited due to recent surgical procedure  Skin: Right shoulder incision site with honeycomb dressing small amount of dried drainage  Neuro: Awake and alert, oriented to person and place, voice very hoarse post extubation, follows commands GU: Foley catheter in place draining yellow urine  Assessment & Plan:   73 year old female presented on 12/06 for right reverse shoulder arthroplasty due to suspected septic joint complicated by post  operative cardiac arrest (PEA).  #Toxic Metabolic Encephalopathy #Postop pain  Hx: RLS  -Treatment of metabolic derangements and pain as outlined below -Provide supportive care -Promote normal sleep/wake cycle and family presence -Avoid sedating medications as able -Resume scheduled oxycodone 10 mg q6hrs for pain management once able to tolerate PO vs NGT placement -Continue outpatient pramipexole  -Change Fentanyl to Dilaudid   #PEA Arrest #NSTEMI #Circulatory Shock ~ RESOLVED #Afib (new onset) ~ converted to NSR Echo 11/20/23: EF 55 to 60%; trivial mitral valve regurgitation -Continuous telemetry monitoring  -Continue heparin gtt  -Continue aspirin   #Acute Hypoxic Respiratory Failure #ARDS #Right traumatic pneumothorax~resolved chest tube removed 12/11 #Possible aspiration pneumonia #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 -Supplemental O2 as needed to maintain O2 sats >92% -Remains high risk for reintubation -Follow intermittent Chest X-ray & ABG as needed -Bronchodilators and Hypertonic saline nebs -Aggressive Pulmonary toilet as able -Mobilize as able ~ PT/OT consults  #Anemia in stage 4 CKD #Iron deficiency anemia  -Trend CBC  -Monitor for s/sx of bleeding -Transfuse for hgb less than 7 -Continue outpatient ferrous sulfate   #AKI superimposed on stage 4 CKD #AG Metabolic Acidosis -Monitor I&O's / urinary output -Follow BMP -Ensure adequate renal perfusion -Avoid nephrotoxic agents as able -Replace electrolytes as indicated ~ Pharmacy following for assistance with electrolyte replacement -Nephrology following, appreciate input  #R. Reverse shoulder arthroplasty (9/17) #Revision with converstion to long stemp humeroal component for periprosthetic fracture (10/21) #Right reverse shoulder arthroplasty explant of glenoid and humeral components for infection (12/6) #HAP -Monitor fever curve -Trend WBC's & Procalcitonin -Follow cultures as above -ID following, appreciate input  ~ Continue empiric Ceftriaxone and Daptomycin per ID recs pending cultures & sensitivities -Orthopedics following, appreciate input   Endocrine - CBG's q4hrs  - Follow hyper/hypoglycemic protocol  - Target range 140 to 180   Best Practice (right click and "Reselect all SmartList Selections" daily)   Diet/type: NPO, speech evaluation pending DVT prophylaxis systemic heparin Pressure ulcer(s): N/A GI prophylaxis: H2B Lines: Central line and yes and it is still needed Foley:  yes and still needed due to urinary retention Code Status:  full code Last date of multidisciplinary goals of care discussion [11/25/2023]  12/13: Will update pt's husband when he arrives at bedside.  Labs   CBC: Recent  Labs  Lab 11/19/23 0216 11/19/23 1310 11/19/23 2039 11/21/23 0402 11/22/23 0407 11/23/23 0544 11/24/23 0448 11/25/23 0152  WBC 8.6 14.8*   < > 15.3* 14.6* 8.8 8.8 11.0*  NEUTROABS 7.1 9.3*  --   --   --   --   --   --   HGB 7.1* 8.7*   < > 7.6* 7.4* 7.2* 7.5* 8.6*  HCT 23.4* 29.1*   < > 23.8* 23.4* 22.8* 23.9* 28.3*  MCV 92.9 96.7   < > 89.5 92.9 91.2 89.2 93.7  PLT 304 334   < > 251 230 227 234 274   < > = values in this interval not displayed.    Basic Metabolic Panel: Recent Labs  Lab 11/21/23 0402 11/22/23 0407 11/23/23 0544 11/24/23 0448 11/25/23 0152  NA 137 140 136 137 137  K 3.7 3.9 3.9 4.5 4.6  CL 104 104 101 104 103  CO2 25 21* 22 25 20*  GLUCOSE 107* 96 155* 146* 92  BUN 67* 72* 76* 84* 90*  CREATININE 2.66* 2.96* 2.88* 2.61* 2.74*  CALCIUM 8.6* 8.7* 8.6* 8.5* 8.9  MG 2.0 2.0 2.0 2.1 2.1  PHOS 4.0 5.2* 4.1 3.4 5.0*   GFR: Estimated Creatinine Clearance: 19.2 mL/min (A) (by C-G formula based on SCr of 2.74 mg/dL (H)). Recent Labs  Lab 11/19/23 1311 11/19/23 1455 11/19/23 2039 11/20/23 0252 11/22/23 0407 11/23/23 0544 11/24/23 0448 11/25/23 0152  WBC  --   --  27.5*   < > 14.6* 8.8 8.8 11.0*  LATICACIDVEN 7.3* 2.2* 1.5  --   --   --   --   --    <  > = values in this interval not displayed.    Liver Function Tests: Recent Labs  Lab 11/19/23 1310 11/19/23 2039 11/20/23 0252 11/21/23 0402 11/22/23 0407  AST 96* 122* 102* 51* 36  ALT 55* 54* 41 20 16  ALKPHOS 135* 124 115 120 152*  BILITOT 0.9 0.6 0.7 0.8 0.9  PROT 6.9 6.7 6.3* 6.1* 6.0*  ALBUMIN 2.8* 2.6* 2.6* 2.4* 2.1*   No results for input(s): "LIPASE", "AMYLASE" in the last 168 hours. No results for input(s): "AMMONIA" in the last 168 hours.  ABG    Component Value Date/Time   PHART 7.33 (L) 11/21/2023 1742   PCO2ART 46 11/21/2023 1742   PO2ART 143 (H) 11/21/2023 1742   HCO3 23.6 11/25/2023 0200   ACIDBASEDEF 3.2 (H) 11/25/2023 0200   O2SAT 68.8 11/25/2023 0200     Coagulation Profile: Recent Labs  Lab 11/19/23 1310 11/22/23 1247  INR 1.4* 1.3*    Cardiac Enzymes: Recent Labs  Lab 11/19/23 0216 11/19/23 1310 11/19/23 2039 11/23/23 0544  CKTOTAL 108 134 291* 35*    HbA1C: No results found for: "HGBA1C"  CBG: Recent Labs  Lab 11/24/23 1132 11/24/23 1628 11/24/23 1948 11/25/23 0020 11/25/23 0359  GLUCAP 110* 103* 96 83 73    Past Medical History:  She,  has a past medical history of Acute hypoxemic respiratory failure (HCC), Anemia in stage 4 chronic kidney disease (HCC), Anginal pain (HCC), Anxiety, Aortic atherosclerosis (HCC), CHF (congestive heart failure) (HCC), Chronic pain, Chronic radicular pain of lower back, CKD (chronic kidney disease), stage IV (HCC), Complication of anesthesia, Coronary artery calcification seen on CT scan, Costochondritis, DDD (degenerative disc disease), lumbar, Depression, Dyspnea, GERD (gastroesophageal reflux disease), Hiatal hernia, Hip dysplasia, Hyperlipidemia, Hypertension, Insomnia, Iron deficiency, Iron deficiency anemia, Low back pain, Low vitamin B12 level, Lumbar facet joint pain,  Migraines, Nose colonized with MRSA (10/03/2020), Numbness and tingling of right leg, OA (osteoarthritis), Obesity, OSA  (obstructive sleep apnea), Osteoporosis, Pelvic fracture (HCC), Pneumonia, Pulmonary fibrosis (HCC), Raynaud's disease without gangrene, Restless leg syndrome, Rheumatoid arthritis (HCC), Right renal mass (05/28/2023), Seasonal allergies, and Thoracic compression fracture (HCC).   Surgical History:   Past Surgical History:  Procedure Laterality Date   ABDOMINAL SURGERY     pt denies   APPENDECTOMY     CARPAL TUNNEL RELEASE Bilateral    CHOLECYSTECTOMY     COLONOSCOPY WITH PROPOFOL N/A 08/15/2020   Procedure: COLONOSCOPY WITH PROPOFOL;  Surgeon: Earline Mayotte, MD;  Location: [facility];  Service: Endoscopy;  Laterality: N/A;   CYSTOSCOPY W/ RETROGRADES Right 07/18/2023   Procedure: CYSTOSCOPY WITH RETROGRADE PYELOGRAM;  Surgeon: [name], MD;  Location: [facility];  Service: Urology;  Laterality: Right;   DILATION AND CURETTAGE OF UTERUS     ESOPHAGOGASTRODUODENOSCOPY (EGD) WITH PROPOFOL N/A 08/15/2020   Procedure: ESOPHAGOGASTRODUODENOSCOPY (EGD) WITH PROPOFOL;  Surgeon: Earline Mayotte, MD;  Location: [facility];  Service: Endoscopy;  Laterality: N/A;   FRACTURE SURGERY     hip fracture    FRACTURE SURGERY     pelvic fracture plate    HIP SURGERY Left    KNEE ARTHROPLASTY Right 10/13/2020   Procedure: COMPUTER ASSISTED TOTAL KNEE ARTHROPLASTY - RNFA;  Surgeon: Donato Heinz, MD;  Location: [facility];  Service: Orthopedics;  Laterality: Right;   REVERSE SHOULDER ARTHROPLASTY Right 08/30/2023   Procedure: Right reverse shoulder arthroplasty, biceps tenodesis;  Surgeon: Signa Kell, MD;  Location: [facility];  Service: Orthopedics;  Laterality: Right;   TOTAL SHOULDER REVISION Right 10/03/2023   Procedure: Revision right reverse shoulder arthroplasty with conversion to long humeral stem and open reduction internal fixation of the humerus;  Surgeon: Signa Kell, MD;  Location: [facility];  Service: Orthopedics;  Laterality: Right;   TOTAL SHOULDER REVISION Right 11/18/2023    Procedure: Right revision reverse shoulder arthroplasty (conversion to longstem and glenosphere exchange);  Surgeon: Signa Kell, MD;  Location: [facility];  Service: Orthopedics;  Laterality: Right;   TUBAL LIGATION     URETEROSCOPY  07/18/2023   Procedure: DIAGNOSTIC URETEROSCOPY;  Surgeon: [name], MD;  Location: [facility];  Service: Urology;;     Social History:   reports that she has never smoked. She has never used smokeless tobacco. She reports that she does not drink alcohol and does not use drugs.   Family History:  Her family history includes Aneurysm in her father; Cancer in her brother; Diabetes in her brother, maternal grandfather, mother, and son; Heart disease in her maternal grandfather; Hypertension in her mother; Osteosarcoma in her brother; Seizures in her son.   Allergies Allergies  Allergen Reactions   Gabapentin Other (See Comments)    Weight gain   Ibuprofen Other (See Comments)    Headache     Home Medications  Prior to Admission medications   Medication Sig Start Date End Date Taking? Authorizing Provider  acetaminophen (TYLENOL) 500 MG tablet Take 2 tablets (1,000 mg total) by mouth every 6 (six) hours as needed. 08/31/23  Yes Anson Oregon, PA-C  aspirin EC 325 MG tablet Take 1 tablet (325 mg total) by mouth daily. 08/31/23  Yes Anson Oregon, PA-C  calcitRIOL (ROCALTROL) 0.25 MCG capsule Take 0.25 mcg by mouth every morning. 05/22/21  Yes [provider]  Calcium Carb-Cholecalciferol 600-400 MG-UNIT CAPS Take 1 capsule by mouth 3 (three) times daily. 01/13/10  Yes [provider]  chlorhexidine (HIBICLENS) 4 % external liquid Apply 15 mLs (1 Application total) topically as directed for 30 doses. Use as directed daily for 5 days every other week for 6 weeks. 11/18/23  Yes Signa Kell, MD  Cholecalciferol 25 MCG (1000 UT) tablet Take 1,000 Units by mouth every morning. 09/23/09  Yes [provider]  cyanocobalamin 1000 MCG  tablet Take 1,000 mcg by mouth every morning.   Yes [provider]  cyclobenzaprine (FLEXERIL) 10 MG tablet Take 10 mg by mouth at bedtime as needed for muscle spasms (Leg cramps).   Yes [provider]  enalapril-hydrochlorothiazide (VASERETIC) 10-25 MG tablet Take 1 tablet by mouth at bedtime. 09/12/23  Yes Hammock, Lavonna Rua, NP  ferrous sulfate 325 (65 FE) MG tablet Take 325 mg by mouth daily with breakfast.   Yes [provider]  furosemide (LASIX) 40 MG tablet Take 1.5 tablets (60 mg total) by mouth every morning. Increase to 1.5 tablet (60 mg total) by mouth in morning and extra 1 tablet (40 mg total for maximum daily dose 100 mg) at lunch time as needed for up to 3 days for increased leg swelling, shortness of breath, weight gain 5+ lbs over 1-2 days. Seek medical care if these symptoms are not improving with increased dose. 09/02/23  Yes Sunnie Nielsen, DO  hydroxychloroquine (PLAQUENIL) 200 MG tablet Take 200 mg by mouth 2 (two) times daily. 06/22/21  Yes [provider]  mirtazapine (REMERON) 30 MG tablet Take 30 mg by mouth at bedtime. 06/29/22  Yes [provider]  Multiple Vitamins-Minerals (MULTIVITAMIN ADULT PO) Take 1 tablet by mouth every morning. 09/10/08  Yes [provider]  mupirocin ointment (BACTROBAN) 2 % Apply small about inside of both nostrils TWICE a day for the next 5 days. 08/25/23  Yes Verlee Monte, NP  mupirocin ointment (BACTROBAN) 2 % Place 1 Application into the nose 2 (two) times daily for 60 doses. Use as directed 2 times daily for 5 days every other week for 6 weeks. 11/18/23 12/18/23 Yes Signa Kell, MD  omeprazole (PRILOSEC) 40 MG capsule Take 40 mg by mouth every morning.   Yes [provider]  ondansetron (ZOFRAN) 4 MG tablet Take 1 tablet (4 mg total) by mouth every 6 (six) hours as needed for nausea. 08/31/23  Yes Anson Oregon, PA-C  oxyCODONE (OXY IR/ROXICODONE) 5 MG immediate release tablet Take  1 tablet (5 mg total) by mouth every 4 (four) hours as needed for moderate pain (pain score 4-6) (pain score 4-6). 10/04/23  Yes Dedra Skeens, PA-C  Pirfenidone 267 MG TABS Take 801 tablets by mouth 3 (three) times daily. 12/24/22  Yes [provider]  pramipexole (MIRAPEX) 0.125 MG tablet Take 0.125-0.25 mg by mouth See admin instructions. Take 0.125 mg in the morning and 0.25 mg  at bedtime 02/15/18  Yes [provider]  PROLIA 60 MG/ML SOSY injection Inject 60 mg into the skin every 6 (six) months. 04/06/23  Yes [provider]  rosuvastatin (CRESTOR) 5 MG tablet Take 5 mg by mouth every other day. 07/18/19  Yes [provider]  spironolactone (ALDACTONE) 25 MG tablet Take 25 mg by mouth at bedtime. 04/09/21  Yes [provider]  tetrahydrozoline 0.05 % ophthalmic solution Place 1 drop into both eyes daily as needed (allergies).   Yes [provider]  venlafaxine XR (EFFEXOR-XR) 150 MG 24 hr capsule Take 150 mg by mouth See admin instructions. Take with 75 mg  for total 225 mg in the morning 12/28/22 12/28/23 Yes [provider]  venlafaxine XR (EFFEXOR-XR) 75 MG 24 hr capsule Take 75 mg by mouth See admin instructions. Take with 150 mg for a total of 225 mg in the morning 08/24/23  Yes [provider]  chlorhexidine (HIBICLENS) 4 % external liquid Apply 15 mLs (1 Application total) topically as directed for 30 doses. Use as directed daily for 5 days every other week for 6 weeks. Patient not taking: Reported on 11/17/2023 10/03/23   Signa Kell, MD     Critical care time: 40 minutes    Harlon Ditty, AGACNP-BC [facility] Prefer epic messenger for cross cover needs If after hours, please call E-link

## 2023-11-25 NOTE — Evaluation (Signed)
Clinical/Bedside Swallow Evaluation Patient Details  Name: Tonya Cummings MRN: [id number] Date of Birth: August 16, 1951  Today's Date: 11/25/2023 Time: SLP Start Time (ACUTE ONLY): 1200 SLP Stop Time (ACUTE ONLY): 1300 SLP Time Calculation (min) (ACUTE ONLY): 60 min  Past Medical History:  Past Medical History:  Diagnosis Date   Acute hypoxemic respiratory failure (HCC)    Anemia in stage 4 chronic kidney disease (HCC)    Anginal pain (HCC)    Anxiety    Aortic atherosclerosis (HCC)    CHF (congestive heart failure) (HCC)    a.) TTE 09/30/2021: EF 55-60%, no RWMAs, mild MR, G1DD   Chronic pain    Chronic radicular pain of lower back    CKD (chronic kidney disease), stage IV (HCC)    Complication of anesthesia    a.) delayed emergence following ureteroscopy 07/2023   Coronary artery calcification seen on CT scan    Costochondritis    DDD (degenerative disc disease), lumbar    Depression    Dyspnea    GERD (gastroesophageal reflux disease)    Hiatal hernia    Hip dysplasia    Hyperlipidemia    Hypertension    Insomnia    Iron deficiency    Iron deficiency anemia    Low back pain    Low vitamin B12 level    Lumbar facet joint pain    Migraines    Nose colonized with MRSA 10/03/2020   a.) preop PCR (+) 10/03/2020 prior to RIGHT TKA; b.) preop PCR (+) 08/25/2023 prior to RIGHT REVERSE SHOULDER ARTHROPLASTY; BICEPS TENODESIS   Numbness and tingling of right leg    OA (osteoarthritis)    Obesity    OSA (obstructive sleep apnea)    a.) not currently utilizing nocturnal PAP therapy; positional. PCCM feels as if symptom can be mitigated with diet/exercise/weight loss unless develops significant symptoms.   Osteoporosis    a.) recieves denosumab injections   Pelvic fracture (HCC)    Pneumonia    Pulmonary fibrosis (HCC)    Raynaud's disease without gangrene    Restless leg syndrome    a.) on pramipexole + oral Fe supplementation   Rheumatoid arthritis (HCC)    a.) Tx'd  with hydroxychloroquine   Right renal mass 05/28/2023   a.) CT renal 05/28/2023:  5.7 cm mass in the medial aspect of right kidney   Seasonal allergies    Thoracic compression fracture [facility])    Past Surgical History:  Past Surgical History:  Procedure Laterality Date   ABDOMINAL SURGERY     pt denies   APPENDECTOMY     CARPAL TUNNEL RELEASE Bilateral    CHOLECYSTECTOMY     COLONOSCOPY WITH PROPOFOL N/A 08/15/2020   Procedure: COLONOSCOPY WITH PROPOFOL;  Surgeon: Earline Mayotte, MD;  Location: [facility] ENDOSCOPY;  Service: Endoscopy;  Laterality: N/A;   CYSTOSCOPY W/ RETROGRADES Right 07/18/2023   Procedure: CYSTOSCOPY WITH RETROGRADE PYELOGRAM;  Surgeon: [name], MD;  Location: [facility] ORS;  Service: Urology;  Laterality: Right;   DILATION AND CURETTAGE OF UTERUS     ESOPHAGOGASTRODUODENOSCOPY (EGD) WITH PROPOFOL N/A 08/15/2020   Procedure: ESOPHAGOGASTRODUODENOSCOPY (EGD) WITH PROPOFOL;  Surgeon: Earline Mayotte, MD;  Location: [facility] END[facility];  Service: Endoscopy;  Laterality: N/A;   FRACTURE SURGERY     hip fracture    FRACTURE SURGERY     pelvic fracture plate    HIP SURGERY Left    KNEE ARTHROPLASTY Right 10/13/2020   Procedure: COMPUTER ASSISTED TOTAL KNEE ARTHROPLASTY - RNFA;  Surgeon: Donato Heinz, MD;  Location: [facility] OR[facility];  Service: Orthopedics;  Laterality: Right;   REVERSE SHOULDER ARTHROPLASTY Right 08/30/2023   Procedure: Right reverse shoulder arthroplasty, biceps tenodesis;  Surgeon: Signa Kell, MD;  Location: [facility] OR[facility];  Service: Orthopedics;  Laterality: Right;   TOTAL SHOULDER REVISION Right 10/03/2023   Procedure: Revision right reverse shoulder arthroplasty with conversion to long humeral stem and open reduction internal fixation of the humerus;  Surgeon: Signa Kell, MD;  Location: [facility] OR[facility];  Service: Orthopedics;  Laterality: Right;   TOTAL SHOULDER REVISION Right 11/18/2023   Procedure: Right revision reverse shoulder arthroplasty (conversion to longstem and  glenosphere exchange);  Surgeon: Signa Kell, MD;  Location: [facility] OR[facility];  Service: Orthopedics;  Laterality: Right;   TUBAL LIGATION     URETEROSCOPY  07/18/2023   Procedure: DIAGNOSTIC URETEROSCOPY;  Surgeon: [name], MD;  Location: [facility];  Service: Urology;;   HPI:  Pt is a 72 y/o female with h/o Hiatal hernia, GERD, RLS, OSA, IDA, depression, Raynaud's disease, obesity, restless leg, anxiety, HLD, HTN, PAF, CKD IV, CHF, ILD, DDD, RA, right total knee arthroplasty (2021) and renal cysts who is admitted with septic joint s/p right reverse shoulder arthroplasty 12/6 complicated by PEA arrest, AKI, NSTEMI and pneumothorax 12/7. She was intubated, then extubated on 11/24/23.   Intraoperatively, it was noted that patient's joint appeared infected.    CXR: 11/24/23: Interval removal of right chest tube. No measurable pneumothorax.  2. Patchy airspace disease in the right lower lung field with no  interval improvement or worsening.  3. Cardiomegaly with mild central vascular congestion but with  improvement. Left Lung clear.    Assessment / Plan / Recommendation  Clinical Impression   Pt seen for BSE today. Pt awake, verbal but w/ Aphonia>Dysphonia(declined vocal quality -- nothing above a whisper heard) d/t recent oral intubation. Poor cough effort, effectiveness. Pt was able to follow basic instructions and answer simple Y/N questions. Noted min increased respiratory effort w/ increased activity. She seemed easily distracted w/ increased motor movements in the bed, which led her to begin leaning to her Left side w/ head tilt to Right side. Pt did not attempt to correct her leaning positioning.  On HFNC w/ 40L at 40% fiO2; afebrile. WBC 11.0  Pt presents w/ High risk for aspiration/aspiration pneumonia in setting of in setting of declined Pulmonary status - recent oral intubation for ~5 days, now extubated w/ Aphonia and week cough effort; Esophageal phase dysmotility w/ Hiatal Hernia, GERD, Pt also  requires significant support w/ feeding at meals d/t overall weakness/illness. Frequent REST BREAKS required during any/all tasks including po intake d/t WOB/SOB secondary to any exertion.    Pt presents w/ suspected oropharyngeal phase dysphagia and risk for aspiration secondary to declined Pulmonary status requiring need for increased O2 support(HFNC). ANY such significant Pulmonary decline can impact Apnea timing and airway closure during the swallow which can impact pharyngeal swallowing, airway protection, and increase risk for aspiration to occur thus further Pulmonary impact/decline.    During this evaluation, trials of Applesauce/puree only were presented w/ REST BREAKS b/t trials and careful monitoring of pt's RR(low-mid 20s) w/ the exertion. No overt, clinical s/s of aspiration noted w/ trials given -- respiratory effort appeared to remain at baseline w/ the exertion AND REST BREAKS given b/t trials. No decline in ANS: O2 sats 99-100%, RR low 20s, HR 88. Swallowing of Applesauce appeared timely. Oral phase appeared [facility] for bolus management and timely A-P transfer w/  trial consistency given -- no other consistencies assessed d/t pt's overall presentation.  Unable to assess pt's full vocal quality(whisper) and cough during session(cough was minimal and poor in effort). Pt's body positioning worsened as time passed requiring repositioning midline w/ use of pillows.  OM exam revealed no unilateral weakness.   In setting of pt's overall presentation and risk for aspiration/aspiration pneumonia, recommend NPO status w/ therapeutic trials of Applesauce w/ NSG, therapy post oral care; oral care frequently for hygiene and stimulation of swallowing. Aspiration precautions; feeding support and positioning.  ST services will f/u next 1-3 days w/ ongoing assessment of swallowing and trials to upgrade to least restrictive diet as appears safe for pt. Discussed w/ NP/NSG the concern for pt to be able to meet her  nutrition needs orally in setting of above presentation described.  SLP Visit Diagnosis: Dysphagia, unspecified (R13.10) (recent extubation post ~5 days of intubation)    Aspiration Risk  Moderate aspiration risk;Risk for inadequate nutrition/hydration    Diet Recommendation   NPO (Applesauce trials w/ NSG - therapeutic only)  Medication Administration: Via alternative means    Other  Recommendations Recommended Consults: Consider GI evaluation (Dietician f/u) Oral Care Recommendations: Oral care BID;Oral care before and after PO;Staff/trained caregiver to provide oral care Caregiver Recommendations:  (TBD)    Recommendations for follow up therapy are one component of a multi-disciplinary discharge planning process, led by the attending physician.  Recommendations may be updated based on patient status, additional functional criteria and insurance authorization.  Follow up Recommendations Follow physician's recommendations for discharge plan and follow up therapies      Assistance Recommended at Discharge  FULL  Functional Status Assessment Patient has had a recent decline in their functional status and demonstrates the ability to make significant improvements in function in a reasonable and predictable amount of time.  Frequency and Duration min 2x/week  2 weeks       Prognosis Prognosis for improved oropharyngeal function: Fair Barriers to Reach Goals: Time post onset;Severity of deficits Barriers/Prognosis Comment: recent extubation post ~5 days of intubation; Hiatal Hernia      Swallow Study   General Date of Onset: 11/18/23 HPI: Pt is a 72 y/o female with h/o Hiatal hernia, GERD, RLS, OSA, IDA, depression, Raynaud's disease, obesity, restless leg, anxiety, HLD, HTN, PAF, CKD IV, CHF, ILD, DDD, RA, right total knee arthroplasty (2021) and renal cysts who is admitted with septic joint s/p right reverse shoulder arthroplasty 12/6 complicated by PEA arrest, AKI, NSTEMI and  pneumothorax 12/7. She was intubated, then extubated on 11/24/23.   Intraoperatively, it was noted that patient's joint appeared infected.    CXR: 11/24/23: Interval removal of right chest tube. No measurable pneumothorax.  2. Patchy airspace disease in the right lower lung field with no  interval improvement or worsening.  3. Cardiomegaly with mild central vascular congestion but with  improvement. Left Lung clear. Type of Study: Bedside Swallow Evaluation Previous Swallow Assessment: none Diet Prior to this Study: NPO Temperature Spikes Noted: No (wbc 11.0) Respiratory Status: Nasal cannula (HFNC 40L; fiO2 40%.) History of Recent Intubation: Yes Total duration of intubation (days): 5 days Date extubated: 11/24/23 Behavior/Cognition: Alert;Cooperative;Pleasant mood;Distractible;Requires cueing Oral Cavity Assessment: Dry Oral Care Completed by SLP: Yes Oral Cavity - Dentition: Adequate natural dentition;Missing dentition (few) Vision:  (n/a) Self-Feeding Abilities: Total assist Patient Positioning: Upright in bed (needed full positioning support) Baseline Vocal Quality: Hoarse;Low vocal intensity;Aphonic (whisper; post recent extubation) Volitional Cough: Weak (poorly effective) Volitional Swallow: Able  to elicit    Oral/Motor/Sensory Function Overall Oral Motor/Sensory Function: Within functional limits   Ice Chips Ice chips: Not tested   Thin Liquid Thin Liquid: Not tested    Nectar Thick Nectar Thick Liquid: Not tested   Honey Thick Honey Thick Liquid: Not tested   Puree Puree: Within functional limits Presentation: Spoon (fed; ~3 ozs) Other Comments: no decline in O2 sats, RR, HR   Solid     Solid: Not tested        Jerilynn Som, MS, CCC-SLP Speech Language Pathologist Rehab Services; [facility] [phone] (ascom) Geffrey Michaelsen 11/25/2023,4:31 PM

## 2023-11-25 NOTE — Evaluation (Signed)
Occupational Therapy Re-Evaluation Patient Details Name: Tonya Cummings MRN: [id number] DOB: 1950-12-20 Today's Date: 11/25/2023   History of Present Illness Pt s/p revision right reverse shoulder arthroplasty 12/06. PMH includes: knee arthroplasty 2021, R reverse shoulder arthroplasty Sept 2024 with revision Oct 2024, hypoxemic respiratory failure, pelvic fx, headaches, anxiety, depression, GERD, renal disease, arthritis, CKD, costochondritis, hip dysplasia, hyperlipidemia, HTN, lumbago, osteoporsosi, rheumatoid arthritis, seasonal allergies, sleep disorder, and thoracic compression fx. Pt had a witnessed hypoxic arrest with brief downtime requiring intubation for airway protection and hypoxic respiratory failure, now extubated.   Clinical Impression   Pt was seen for OT Re-evaluation this date following above. Upon arrival to room pt supine in bed, agreeable to tx. Pt with impaired functional use of RUE which limits bimanual tasks for ADL performance. Pt required Max Ax2 for supine to sitting at the EOB. Pt noted posterior lean, unable to sit upright. Pt's VSS. Pt required trunk and back support. Pt reported no lightheadedness at this time, endorsed chest pain. Pt completed oral care utilizing yankauer suction in L hand with min A.  Pt returned to bed. Pt left supine in bed with call bell within reach and all needs met. Of note, sling doffed d/t L internal jugular catheter. Pt would benefit from skilled OT services to address noted impairments and functional limitations (see below for any additional details) in order to maximize safety and independence while minimizing falls risk and caregiver burden. Follow physician's recommendation for d/c plan and follow up therapies.      If plan is discharge home, recommend the following: A lot of help with bathing/dressing/bathroom;Assistance with cooking/housework;Assistance with feeding;Help with stairs or ramp for entrance;Assist for transportation;A  lot of help with walking and/or transfers    Functional Status Assessment  Patient has had a recent decline in their functional status and demonstrates the ability to make significant improvements in function in a reasonable and predictable amount of time.  Equipment Recommendations  None recommended by OT    Recommendations for Other Services       Precautions / Restrictions Precautions Precautions: Shoulder Type of Shoulder Precautions: NWB, shoulder immobilizer on except for ADLs/exercises Shoulder Interventions: Shoulder sling/immobilizer;At all times;Off for dressing/bathing/exercises Precaution Comments: NWB Required Braces or Orthoses: Sling Restrictions Weight Bearing Restrictions Per Provider Order: Yes RUE Weight Bearing Per Provider Order: Non weight bearing      Mobility Bed Mobility Overal bed mobility: Needs Assistance Bed Mobility: Supine to Sit     Supine to sit: Max assist, +2 for physical assistance Sit to supine: +2 for physical assistance, Max assist     Patient Response: Cooperative  Transfers                          Balance Overall balance assessment: Needs assistance Sitting-balance support: No upper extremity supported, Feet supported Sitting balance-Leahy Scale: Zero   Postural control: Posterior lean     Standing balance comment: Not tested at this time                           ADL either performed or assessed with clinical judgement   ADL Overall ADL's : Needs assistance/impaired                                       General ADL Comments: Pt with impaired functional  use of RUE which limits bimanual tasks for ADL performance. Pt required Max Ax2 for supine to sitting at the EOB. Pt noted posterior lean, unable to sit upright. Pt required trunk and back support. Pt completed oral care utilizing yankauer suction in L hand with min A.     Vision         Perception         Praxis          Pertinent Vitals/Pain Pain Assessment Pain Assessment: Faces Faces Pain Scale: Hurts a little bit Pain Location: chest Pain Descriptors / Indicators: Discomfort, Grimacing Pain Intervention(s): Monitored during session, Limited activity within patient's tolerance     Extremity/Trunk Assessment Upper Extremity Assessment Upper Extremity Assessment: Right hand dominant;RUE deficits/detail;Generalized weakness RUE Deficits / Details: 3rd shoulder surgery   Lower Extremity Assessment Lower Extremity Assessment: Generalized weakness       Communication Communication Communication: No apparent difficulties   Cognition Arousal: Alert Behavior During Therapy: WFL for tasks assessed/performed Overall Cognitive Status: Within Functional Limits for tasks assessed                                       General Comments       Exercises     Shoulder Instructions      Home Living Family/patient expects to be discharged to:: Private residence Living Arrangements: Spouse/significant other;Children Available Help at Discharge: Family;Available 24 hours/day Type of Home: Mobile home Home Access: Stairs to enter Entrance Stairs-Number of Steps: 3 Entrance Stairs-Rails: Can reach both;Right;Left Home Layout: One level     Bathroom Shower/Tub: Producer, television/film/video: Standard Bathroom Accessibility: Yes   Home Equipment: Agricultural consultant (2 wheels);Cane - single point;BSC/3in1          Prior Functioning/Environment Prior Level of Function : Needs assist             Mobility Comments: husband has been helping with ambulation, stair navigation ADLs Comments: husband has been helping with ADLs due to recenty shoulder surgeries        OT Problem List: Decreased strength;Decreased range of motion;Decreased activity tolerance;Impaired balance (sitting and/or standing);Impaired UE functional use;Pain;Cardiopulmonary status limiting activity;Decreased  knowledge of use of DME or AE;Decreased knowledge of precautions      OT Treatment/Interventions: Self-care/ADL training;Therapeutic exercise;Neuromuscular education;Energy conservation;DME and/or AE instruction;Therapeutic activities;Patient/family education;Balance training    OT Goals(Current goals can be found in the care plan section) Acute Rehab OT Goals Patient Stated Goal: to get better OT Goal Formulation: With patient Time For Goal Achievement: 12/09/23 Potential to Achieve Goals: Good ADL Goals Pt Will Perform Grooming: sitting;with mod assist Pt Will Perform Lower Body Dressing: with mod assist;sitting/lateral leans Pt Will Transfer to Toilet: bedside commode;with min assist;squat pivot transfer  OT Frequency: Min 1X/week    Co-evaluation              AM-PAC OT "6 Clicks" Daily Activity     Outcome Measure Help from another person eating meals?: A Little Help from another person taking care of personal grooming?: A Little Help from another person toileting, which includes using toliet, bedpan, or urinal?: A Lot Help from another person bathing (including washing, rinsing, drying)?: A Lot Help from another person to put on and taking off regular upper body clothing?: A Lot Help from another person to put on and taking off regular lower body clothing?: A Lot 6 Click  Score: 14   End of Session Nurse Communication: Mobility status  Activity Tolerance: Patient tolerated treatment well Patient left: in bed;with bed alarm set;with call bell/phone within reach  OT Visit Diagnosis: History of falling (Z91.81);Pain;Muscle weakness (generalized) (M62.81);Unsteadiness on feet (R26.81);Other abnormalities of gait and mobility (R26.89)                Time: 7425-9563 OT Time Calculation (min): 16 min Charges:  OT General Charges $OT Visit: 1 Visit OT Evaluation $OT Re-eval: 1 Re-eval  Butch Penny, SOT

## 2023-11-26 ENCOUNTER — Inpatient Hospital Stay: Payer: Medicare HMO

## 2023-11-26 ENCOUNTER — Other Ambulatory Visit: Payer: Self-pay

## 2023-11-26 DIAGNOSIS — M009 Pyogenic arthritis, unspecified: Secondary | ICD-10-CM | POA: Diagnosis not present

## 2023-11-26 DIAGNOSIS — J930 Spontaneous tension pneumothorax: Secondary | ICD-10-CM | POA: Diagnosis not present

## 2023-11-26 DIAGNOSIS — I4891 Unspecified atrial fibrillation: Secondary | ICD-10-CM | POA: Diagnosis not present

## 2023-11-26 DIAGNOSIS — N184 Chronic kidney disease, stage 4 (severe): Secondary | ICD-10-CM | POA: Diagnosis not present

## 2023-11-26 DIAGNOSIS — I10 Essential (primary) hypertension: Secondary | ICD-10-CM | POA: Diagnosis not present

## 2023-11-26 DIAGNOSIS — I469 Cardiac arrest, cause unspecified: Secondary | ICD-10-CM | POA: Diagnosis not present

## 2023-11-26 LAB — GLUCOSE, CAPILLARY
Glucose-Capillary: 70 mg/dL (ref 70–99)
Glucose-Capillary: 90 mg/dL (ref 70–99)
Glucose-Capillary: 126 mg/dL — ABNORMAL HIGH (ref 70–99)
Glucose-Capillary: 118 mg/dL — ABNORMAL HIGH (ref 70–99)
Glucose-Capillary: 114 mg/dL — ABNORMAL HIGH (ref 70–99)
Glucose-Capillary: 114 mg/dL — ABNORMAL HIGH (ref 70–99)

## 2023-11-26 LAB — RENAL FUNCTION PANEL
Albumin: 2.4 g/dL — ABNORMAL LOW (ref 3.5–5.0)
Anion gap: 14 (ref 5–15)
BUN: 83 mg/dL — ABNORMAL HIGH (ref 8–23)
CO2: 22 mmol/L (ref 22–32)
Calcium: 9.7 mg/dL (ref 8.9–10.3)
Chloride: 107 mmol/L (ref 98–111)
Creatinine, Ser: 2.58 mg/dL — ABNORMAL HIGH (ref 0.44–1.00)
GFR, Estimated: 19 mL/min — ABNORMAL LOW (ref >60)
Glucose, Bld: 92 mg/dL (ref 70–99)
Phosphorus: 4.8 mg/dL — ABNORMAL HIGH (ref 2.5–4.6)
Potassium: 4.6 mmol/L (ref 3.5–5.1)
Sodium: 143 mmol/L (ref 135–145)

## 2023-11-26 LAB — BLOOD GAS, VENOUS
Acid-base deficit: 3.7 mmol/L — ABNORMAL HIGH (ref 0.0–2.0)
Bicarbonate: 22.1 mmol/L (ref 20.0–28.0)
O2 Saturation: 96.3 %
Patient temperature: 37
pCO2, Ven: 42 mm[Hg] — ABNORMAL LOW (ref 44–60)
pH, Ven: 7.33 (ref 7.25–7.43)
pO2, Ven: 73 mm[Hg] — ABNORMAL HIGH (ref 32–45)

## 2023-11-26 LAB — HEPARIN LEVEL (UNFRACTIONATED): Heparin Unfractionated: 0.42 [IU]/mL (ref 0.30–0.70)

## 2023-11-26 LAB — CBC
HCT: 28.7 % — ABNORMAL LOW (ref 36.0–46.0)
Hemoglobin: 8.7 g/dL — ABNORMAL LOW (ref 12.0–15.0)
MCH: 27.6 pg (ref 26.0–34.0)
MCHC: 30.3 g/dL (ref 30.0–36.0)
MCV: 91.1 fL (ref 80.0–100.0)
Platelets: 344 10*3/uL (ref 150–400)
RBC: 3.15 MIL/uL — ABNORMAL LOW (ref 3.87–5.11)
RDW: 16.9 % — ABNORMAL HIGH (ref 11.5–15.5)
WBC: 10.8 10*3/uL — ABNORMAL HIGH (ref 4.0–10.5)
nRBC: 0.7 % — ABNORMAL HIGH (ref 0.0–0.2)

## 2023-11-26 LAB — MAGNESIUM: Magnesium: 2.4 mg/dL (ref 1.7–2.4)

## 2023-11-26 MED ORDER — FERROUS SULFATE 220 (44 FE) MG/5ML PO SOLN
300.0000 mg | Freq: Every day | ORAL | Status: DC
  Filled 2023-11-26 (×3): qty 6.9

## 2023-11-26 MED ORDER — CALCITRIOL 0.25 MCG PO CAPS
0.2500 ug | ORAL_CAPSULE | Freq: Every morning | ORAL | Status: DC
  Filled 2023-11-26 (×3): qty 1

## 2023-11-26 MED ORDER — PRAMIPEXOLE DIHYDROCHLORIDE 0.25 MG PO TABS
0.2500 mg | ORAL_TABLET | Freq: Every day | ORAL | Status: DC
  Administered 2023-11-30: 0.25 mg via ORAL
  Filled 2023-11-26 (×6): qty 1

## 2023-11-26 MED ORDER — METOPROLOL TARTRATE 5 MG/5ML IV SOLN: 2.5000 mg | INTRAVENOUS | Status: DC | PRN

## 2023-11-26 MED ORDER — STERILE WATER FOR INJECTION IJ SOLN
INTRAMUSCULAR | Status: AC
  Filled 2023-11-26: qty 10

## 2023-11-26 MED ORDER — ASPIRIN 325 MG PO TABS
325.0000 mg | ORAL_TABLET | Freq: Every day | ORAL | Status: DC
  Administered 2023-11-28 – 2023-12-01 (×2): 325 mg via ORAL
  Filled 2023-11-26 (×3): qty 1

## 2023-11-26 MED ORDER — METOPROLOL TARTRATE 5 MG/5ML IV SOLN
2.5000 mg | INTRAVENOUS | Status: DC | PRN
  Administered 2023-11-26 – 2023-11-29 (×9): 2.5 mg via INTRAVENOUS
  Filled 2023-11-26 (×11): qty 5

## 2023-11-26 MED ORDER — AMIODARONE LOAD VIA INFUSION
150.0000 mg | Freq: Once | INTRAVENOUS | Status: AC
  Administered 2023-11-26: 150 mg via INTRAVENOUS
  Filled 2023-11-26: qty 83.34

## 2023-11-26 MED ORDER — VITAMIN D 25 MCG (1000 UNIT) PO TABS: 1000.0000 [IU] | ORAL_TABLET | Freq: Every morning | ORAL | Status: DC

## 2023-11-26 MED ORDER — ALTEPLASE 2 MG IJ SOLR: 2.0000 mg | Freq: Once | INTRAMUSCULAR | Status: AC

## 2023-11-26 MED ORDER — ALTEPLASE 2 MG IJ SOLR
INTRAMUSCULAR | Status: AC
  Administered 2023-11-26: 2 mg
  Filled 2023-11-26: qty 2

## 2023-11-26 MED ORDER — VITAMIN B-12 1000 MCG PO TABS: 1000.0000 ug | ORAL_TABLET | Freq: Every morning | ORAL | Status: DC

## 2023-11-26 MED ORDER — PRAMIPEXOLE DIHYDROCHLORIDE 0.25 MG PO TABS
0.1250 mg | ORAL_TABLET | Freq: Every morning | ORAL | Status: DC
  Administered 2023-11-28 – 2023-12-01 (×2): 0.125 mg via ORAL
  Filled 2023-11-26 (×5): qty 0.5

## 2023-11-26 MED ORDER — OYSTER SHELL CALCIUM/D3 500-5 MG-MCG PO TABS
1.0000 | ORAL_TABLET | Freq: Three times a day (TID) | ORAL | Status: DC
  Filled 2023-11-26: qty 1

## 2023-11-26 MED ORDER — AMIODARONE HCL IN DEXTROSE 360-4.14 MG/200ML-% IV SOLN
60.0000 mg/h | INTRAVENOUS | Status: AC
Start: 2023-11-26 — End: 2023-11-26
  Administered 2023-11-26 (×2): 60 mg/h via INTRAVENOUS
  Filled 2023-11-26 (×2): qty 200

## 2023-11-26 MED ORDER — OXYCODONE HCL 5 MG PO TABS: 10.0000 mg | ORAL_TABLET | Freq: Four times a day (QID) | ORAL | Status: DC | PRN

## 2023-11-26 MED ORDER — AMIODARONE HCL IN DEXTROSE 360-4.14 MG/200ML-% IV SOLN
30.0000 mg/h | INTRAVENOUS | Status: DC
  Administered 2023-11-26 – 2023-11-30 (×8): 30 mg/h via INTRAVENOUS
  Filled 2023-11-26 (×9): qty 200

## 2023-11-26 NOTE — Consult Note (Signed)
Cardiology Consultation   Patient ID: VENISSA JASA MRN: [id number]; DOB: 01/16/1951  Admit date: 11/18/2023 Date of Consult: 11/26/2023  PCP:  Myrene Buddy, NP    [facility] Providers Cardiologist:  Julien Nordmann, MD        Patient Profile:   Tonya Cummings is a 72 y.o. female with a hx of HFpEF, CKD 4, hypertension who is being seen 11/26/2023 for the evaluation of atrial fibrillation at the request of Dr. Clide Dales.  History of Present Illness:   [name] is a 72 year old female with history of HFpEF, CKD 4, hypertension presenting to the hospital for right shoulder surgery.  Initially presented on 12/6 for shoulder surgery.  Postoperatively, she became dyspneic and hypoxic and went into PEA arrest.  CPR was performed for about 5 to 6 minutes with achievement of ROSC.  Patient was then intubated and transferred to ICU.  Chest x-ray showed no pneumonia for which antibiotics was started.  An HD catheter was placed due to renal dysfunction.  Has not needed dialysis since.  Closely followed up by nephrology.  She has no history of atrial fibrillation.  Echocardiogram obtained 12/8 showed EF 55 to 60%.  Patient was extubated 12/13.  EKG today noted to be atrial fibs/atrial flutter heart rate 137.  Patient started on IV amiodarone drip and heparin drip.  States feeling very weak and tired during my exam.   Past Medical History:  Diagnosis Date   Acute hypoxemic respiratory failure (HCC)    Anemia in stage 4 chronic kidney disease (HCC)    Anginal pain (HCC)    Anxiety    Aortic atherosclerosis (HCC)    CHF (congestive heart failure) (HCC)    a.) TTE 09/30/2021: EF 55-60%, no RWMAs, mild MR, G1DD   Chronic pain    Chronic radicular pain of lower back    CKD (chronic kidney disease), stage IV (HCC)    Complication of anesthesia    a.) delayed emergence following ureteroscopy 07/2023   Coronary artery calcification seen on CT scan     Costochondritis    DDD (degenerative disc disease), lumbar    Depression    Dyspnea    GERD (gastroesophageal reflux disease)    Hiatal hernia    Hip dysplasia    Hyperlipidemia    Hypertension    Insomnia    Iron deficiency    Iron deficiency anemia    Low back pain    Low vitamin B12 level    Lumbar facet joint pain    Migraines    Nose colonized with MRSA 10/03/2020   a.) preop PCR (+) 10/03/2020 prior to RIGHT TKA; b.) preop PCR (+) 08/25/2023 prior to RIGHT REVERSE SHOULDER ARTHROPLASTY; BICEPS TENODESIS   Numbness and tingling of right leg    OA (osteoarthritis)    Obesity    OSA (obstructive sleep apnea)    a.) not currently utilizing nocturnal PAP therapy; positional. PCCM feels as if symptom can be mitigated with diet/exercise/weight loss unless develops significant symptoms.   Osteoporosis    a.) recieves denosumab injections   Pelvic fracture (HCC)    Pneumonia    Pulmonary fibrosis (HCC)    Raynaud's disease without gangrene    Restless leg syndrome    a.) on pramipexole + oral Fe supplementation   Rheumatoid arthritis (HCC)    a.) Tx'd with hydroxychloroquine   Right renal mass 05/28/2023   a.) CT renal 05/28/2023:  5.7 cm mass in the medial aspect of  right kidney   Seasonal allergies    Thoracic compression fracture [facility])     Past Surgical History:  Procedure Laterality Date   ABDOMINAL SURGERY     pt denies   APPENDECTOMY     CARPAL TUNNEL RELEASE Bilateral    CHOLECYSTECTOMY     COLONOSCOPY WITH PROPOFOL N/A 08/15/2020   Procedure: COLONOSCOPY WITH PROPOFOL;  Surgeon: Earline Mayotte, MD;  Location: [facility] ENDOSCOPY;  Service: Endoscopy;  Laterality: N/A;   CYSTOSCOPY W/ RETROGRADES Right 07/18/2023   Procedure: CYSTOSCOPY WITH RETROGRADE PYELOGRAM;  Surgeon: [name], MD;  Location: [facility] ORS;  Service: Urology;  Laterality: Right;   DILATION AND CURETTAGE OF UTERUS     ESOPHAGOGASTRODUODENOSCOPY (EGD) WITH PROPOFOL N/A 08/15/2020   Procedure:  ESOPHAGOGASTRODUODENOSCOPY (EGD) WITH PROPOFOL;  Surgeon: Earline Mayotte, MD;  Location: [facility] ENDOSCOPY;  Service: Endoscopy;  Laterality: N/A;   FRACTURE SURGERY     hip fracture    FRACTURE SURGERY     pelvic fracture plate    HIP SURGERY Left    KNEE ARTHROPLASTY Right 10/13/2020   Procedure: COMPUTER ASSISTED TOTAL KNEE ARTHROPLASTY - RNFA;  Surgeon: Donato Heinz, MD;  Location: [facility] ORS;  Service: Orthopedics;  Laterality: Right;   REVERSE SHOULDER ARTHROPLASTY Right 08/30/2023   Procedure: Right reverse shoulder arthroplasty, biceps tenodesis;  Surgeon: Signa Kell, MD;  Location: [facility] ORS;  Service: Orthopedics;  Laterality: Right;   TOTAL SHOULDER REVISION Right 10/03/2023   Procedure: Revision right reverse shoulder arthroplasty with conversion to long humeral stem and open reduction internal fixation of the humerus;  Surgeon: Signa Kell, MD;  Location: [facility] ORS;  Service: Orthopedics;  Laterality: Right;   TOTAL SHOULDER REVISION Right 11/18/2023   Procedure: Right revision reverse shoulder arthroplasty (conversion to longstem and glenosphere exchange);  Surgeon: Signa Kell, MD;  Location: [facility] OR[facility];  Service: Orthopedics;  Laterality: Right;   TUBAL LIGATION     URETEROSCOPY  07/18/2023   Procedure: DIAGNOSTIC URETEROSCOPY;  Surgeon: [name], MD;  Location: [facility] ORS;  Service: Urology;;     Home Medications:  Prior to Admission medications   Medication Sig Start Date End Date Taking? Authorizing Provider  acetaminophen (TYLENOL) 500 MG tablet Take 2 tablets (1,000 mg total) by mouth every 6 (six) hours as needed. 08/31/23  Yes Anson Oregon, PA-C  aspirin EC 325 MG tablet Take 1 tablet (325 mg total) by mouth daily. 08/31/23  Yes Anson Oregon, PA-C  calcitRIOL (ROCALTROL) 0.25 MCG capsule Take 0.25 mcg by mouth every morning. 05/22/21  Yes [provider]  Calcium Carb-Cholecalciferol 600-400 MG-UNIT CAPS Take 1 capsule by mouth 3 (three) times  daily. 01/13/10  Yes [provider]  chlorhexidine (HIBICLENS) 4 % external liquid Apply 15 mLs (1 Application total) topically as directed for 30 doses. Use as directed daily for 5 days every other week for 6 weeks. 11/18/23  Yes Signa Kell, MD  Cholecalciferol 25 MCG (1000 UT) tablet Take 1,000 Units by mouth every morning. 09/23/09  Yes [provider]  cyanocobalamin 1000 MCG tablet Take 1,000 mcg by mouth every morning.   Yes [provider]  cyclobenzaprine (FLEXERIL) 10 MG tablet Take 10 mg by mouth at bedtime as needed for muscle spasms (Leg cramps).   Yes [provider]  enalapril-hydrochlorothiazide (VASERETIC) 10-25 MG tablet Take 1 tablet by mouth at bedtime. 09/12/23  Yes Hammock, Lavonna Rua, NP  ferrous sulfate 325 (65 FE) MG tablet Take 325 mg by mouth daily with breakfast.  Yes [provider]  furosemide (LASIX) 40 MG tablet Take 1.5 tablets (60 mg total) by mouth every morning. Increase to 1.5 tablet (60 mg total) by mouth in morning and extra 1 tablet (40 mg total for maximum daily dose 100 mg) at lunch time as needed for up to 3 days for increased leg swelling, shortness of breath, weight gain 5+ lbs over 1-2 days. Seek medical care if these symptoms are not improving with increased dose. 09/02/23  Yes Sunnie Nielsen, DO  hydroxychloroquine (PLAQUENIL) 200 MG tablet Take 200 mg by mouth 2 (two) times daily. 06/22/21  Yes [provider]  mirtazapine (REMERON) 30 MG tablet Take 30 mg by mouth at bedtime. 06/29/22  Yes [provider]  Multiple Vitamins-Minerals (MULTIVITAMIN ADULT PO) Take 1 tablet by mouth every morning. 09/10/08  Yes [provider]  mupirocin ointment (BACTROBAN) 2 % Apply small about inside of both nostrils TWICE a day for the next 5 days. 08/25/23  Yes Verlee Monte, NP  mupirocin ointment (BACTROBAN) 2 % Place 1 Application into the nose 2 (two) times daily for 60 doses. Use as directed 2 times  daily for 5 days every other week for 6 weeks. 11/18/23 12/18/23 Yes Signa Kell, MD  omeprazole (PRILOSEC) 40 MG capsule Take 40 mg by mouth every morning.   Yes [provider]  ondansetron (ZOFRAN) 4 MG tablet Take 1 tablet (4 mg total) by mouth every 6 (six) hours as needed for nausea. 08/31/23  Yes Anson Oregon, PA-C  oxyCODONE (OXY IR/ROXICODONE) 5 MG immediate release tablet Take 1 tablet (5 mg total) by mouth every 4 (four) hours as needed for moderate pain (pain score 4-6) (pain score 4-6). 10/04/23  Yes Dedra Skeens, PA-C  Pirfenidone 267 MG TABS Take 801 tablets by mouth 3 (three) times daily. 12/24/22  Yes [provider]  pramipexole (MIRAPEX) 0.125 MG tablet Take 0.125-0.25 mg by mouth See admin instructions. Take 0.125 mg in the morning and 0.25 mg  at bedtime 02/15/18  Yes [provider]  PROLIA 60 MG/ML SOSY injection Inject 60 mg into the skin every 6 (six) months. 04/06/23  Yes [provider]  rosuvastatin (CRESTOR) 5 MG tablet Take 5 mg by mouth every other day. 07/18/19  Yes [provider]  spironolactone (ALDACTONE) 25 MG tablet Take 25 mg by mouth at bedtime. 04/09/21  Yes [provider]  tetrahydrozoline 0.05 % ophthalmic solution Place 1 drop into both eyes daily as needed (allergies).   Yes [provider]  venlafaxine XR (EFFEXOR-XR) 150 MG 24 hr capsule Take 150 mg by mouth See admin instructions. Take with 75 mg for total 225 mg in the morning 12/28/22 12/28/23 Yes [provider]  venlafaxine XR (EFFEXOR-XR) 75 MG 24 hr capsule Take 75 mg by mouth See admin instructions. Take with 150 mg for a total of 225 mg in the morning 08/24/23  Yes [provider]  chlorhexidine (HIBICLENS) 4 % external liquid Apply 15 mLs (1 Application total) topically as directed for 30 doses. Use as directed daily for 5 days every other week for 6 weeks. Patient not taking: Reported on 11/17/2023 10/03/23   Signa Kell,  MD    Inpatient Medications: Scheduled Meds:  aspirin  325 mg Per Tube Daily   calcitRIOL  0.25 mcg Per Tube q morning   calcium-vitamin D  1 tablet Per Tube TID   Chlorhexidine Gluconate Cloth  6 each Topical Daily   cholecalciferol  1,000  Units Per Tube q morning   cyanocobalamin  1,000 mcg Per Tube q morning   ferrous sulfate  300 mg Per Tube Daily   lidocaine  1 patch Transdermal Q24H   mupirocin ointment   Topical BID   pramipexole  0.125 mg Per Tube q morning   And   pramipexole  0.25 mg Per Tube QHS   sodium chloride flush  3 mL Intravenous Q12H   sodium chloride flush  3 mL Intravenous Q12H   sodium chloride HYPERTONIC  4 mL Nebulization BID   Continuous Infusions:  amiodarone 30 mg/hr (11/26/23 1523)   cefTRIAXone (ROCEPHIN)  IV 2 g (11/25/23 1736)   DAPTOmycin 700 mg (11/26/23 1315)   heparin 1,700 Units/hr (11/26/23 1045)   PRN Meds: bisacodyl, HYDROmorphone (DILAUDID) injection, ipratropium-albuterol, metoprolol tartrate, mouth rinse, mouth rinse, mouth rinse, oxyCODONE, polyethylene glycol, senna-docusate  Allergies:    Allergies  Allergen Reactions   Gabapentin Other (See Comments)    Weight gain   Ibuprofen Other (See Comments)    Headache    Social History:   Social History   Socioeconomic History   Marital status: Married    Spouse name: Molly Maduro  [phone]   Number of children: Not on file   Years of education: Not on file   Highest education level: Not on file  Occupational History   Not on file  Tobacco Use   Smoking status: Never   Smokeless tobacco: Never  Vaping Use   Vaping status: Never Used  Substance and Sexual Activity   Alcohol use: No   Drug use: No   Sexual activity: Yes  Other Topics Concern   Not on file  Social History Narrative   Not on file   Social Drivers of Health   Financial Resource Strain: Not on file  Food Insecurity: Patient Unable To Answer (11/20/2023)   Hunger Vital Sign    Worried About Running Out of  Food in the Last Year: Patient unable to answer    Ran Out of Food in the Last Year: Patient unable to answer  Transportation Needs: Patient Unable To Answer (11/20/2023)   PRAPARE - Transportation    Lack of Transportation (Medical): Patient unable to answer    Lack of Transportation (Non-Medical): Patient unable to answer  Physical Activity: Not on file  Stress: Not on file  Social Connections: Not on file  Intimate Partner Violence: Patient Unable To Answer (11/20/2023)   Humiliation, Afraid, Rape, and Kick questionnaire    Fear of Current or Ex-Partner: Patient unable to answer    Emotionally Abused: Patient unable to answer    Physically Abused: Patient unable to answer    Sexually Abused: Patient unable to answer    Family History:    Family History  Problem Relation Age of Onset   Diabetes Mother    Hypertension Mother    Aneurysm Father    Diabetes Son    Seizures Son    Osteosarcoma Brother    Cancer Brother    Diabetes Brother    Diabetes Maternal Grandfather    Heart disease Maternal Grandfather      ROS:  Please see the history of present illness.   All other ROS reviewed and negative.     Physical Exam/Data:   Vitals:   11/26/23 1200 11/26/23 1300 11/26/23 1400 11/26/23 1500  BP: 124/67 (!) 174/84 (!) 134/118 (!) 155/77  Pulse: (!) 142 (!) 131 (!) 134 (!) 140  Resp: (!) 21 17 (!)  25 17  Temp:      TempSrc:      SpO2: 100% 96% 90% 93%  Weight:      Height:        Intake/Output Summary (Last 24 hours) at 11/26/2023 1711 Last data filed at 11/26/2023 1253 Gross per 24 hour  Intake --  Output 2300 ml  Net -2300 ml      11/26/2023    5:00 AM 11/23/2023    5:00 AM 11/22/2023    4:59 AM  Last 3 Weights  Weight (lbs) 177 lb 4 oz 177 lb 4 oz 178 lb 2.1 oz  Weight (kg) 80.4 kg 80.4 kg 80.8 kg     Body mass index is 31.4 kg/m.  General: Appears frail, soft-spoken HEENT: normal Neck: no JVD Vascular: No carotid bruits; Distal pulses 2+  bilaterally Cardiac: Irregular irregular, tachycardic Lungs: Inspiratory effort, decreased breath sounds at bases bilaterally Abd: soft, nontender, no hepatomegaly  Ext: no edema Musculoskeletal:  No deformities, BUE and BLE strength normal and equal Skin: warm and dry  Neuro:  CNs 2-12 intact, no focal abnormalities noted Psych:  Normal affect   EKG:  The EKG was personally reviewed and demonstrates: Atrial flutter Telemetry:  Telemetry was personally reviewed and demonstrates: Atrial fibrillation  Relevant CV Studies: Echo 11/20/2023 EF 55 to 60%  Laboratory Data:  High Sensitivity Troponin:   Recent Labs  Lab 11/19/23 1310 11/19/23 1455 11/19/23 2039 11/20/23 0252 11/20/23 2207  TROPONINIHS 208* 392* 749* 804* 293*     Chemistry Recent Labs  Lab 11/24/23 0448 11/25/23 0152 11/26/23 0434  NA 137 137 143  K 4.5 4.6 4.6  CL 104 103 107  CO2 25 20* 22  GLUCOSE 146* 92 92  BUN 84* 90* 83*  CREATININE 2.61* 2.74* 2.58*  CALCIUM 8.5* 8.9 9.7  MG 2.1 2.1 2.4  GFRNONAA 19* 18* 19*  ANIONGAP 8 14 14     Recent Labs  Lab 11/20/23 0252 11/21/23 0402 11/22/23 0407 11/26/23 0434  PROT 6.3* 6.1* 6.0*  --   ALBUMIN 2.6* 2.4* 2.1* 2.4*  AST 102* 51* 36  --   ALT 41 20 16  --   ALKPHOS 115 120 152*  --   BILITOT 0.7 0.8 0.9  --    Lipids  Recent Labs  Lab 11/22/23 0407  TRIG 138    Hematology Recent Labs  Lab 11/24/23 0448 11/25/23 0152 11/26/23 0434  WBC 8.8 11.0* 10.8*  RBC 2.68* 3.02* 3.15*  HGB 7.5* 8.6* 8.7*  HCT 23.9* 28.3* 28.7*  MCV 89.2 93.7 91.1  MCH 28.0 28.5 27.6  MCHC 31.4 30.4 30.3  RDW 16.5* 16.6* 16.9*  PLT 234 274 344   Thyroid No results for input(s): "TSH", "FREET4" in the last 168 hours.  BNPNo results for input(s): "BNP", "PROBNP" in the last 168 hours.  DDimer No results for input(s): "DDIMER" in the last 168 hours.   Radiology/Studies:  DG Chest Port 1 View Result Date: 11/26/2023 CLINICAL DATA:  10026 with shortness of  breath. EXAM: PORTABLE CHEST 1 VIEW COMPARISON:  Portable chest 11/24/2023 at 4:30 a.m. FINDINGS: 6:07 a.m. ETT/NGT interval removal. Right IJ dialysis catheter tip remains in the upper right atrium. Stable enlargement of the cardiac silhouette. Unchanged mild central vascular prominence. There is increased streaky atelectasis or infiltrate in the retrocardiac left lower lobe. There is increased patchy airspace disease in the right upper lobe with unchanged denser consolidation right lower lung field. The left upper lung field remains clear.  There are small pleural effusions. Osteopenia. No new osseous findings with right shoulder replacement and overlying skin staples again shown. In all other respects no other changes are seen. IMPRESSION: 1. Increased patchy airspace disease in the right upper lobe with unchanged denser consolidation right lower lung field. 2. Increased streaky atelectasis or infiltrate in the retrocardiac left lower lobe. 3. Small pleural effusions. 4. Stable cardiomegaly and mild central vascular prominence. 5. Interval removal of ETT/NGT. Electronically Signed   By: Almira Bar M.D.   On: 11/26/2023 06:29   DG Chest Port 1 View Result Date: 11/24/2023 CLINICAL DATA:  [id number] with acute hypoxic respiratory failure. EXAM: PORTABLE CHEST 1 VIEW COMPARISON:  Portable chest yesterday at 3:16 p.m. FINDINGS: 4:30 a.m. Left IJ dialysis catheter terminates in the right atrium slightly above the inferior cavoatrial junction. Interval removal pigtail right chest tube. No measurable pneumothorax. ETT tip is 3.3 cm from the carina, NGT tip is in the gastric antrum. Multiple overlying monitor wires. Stable cardiomegaly. There is mild central vascular congestion but with improvement. Mediastinal configuration is stable. There is patchy airspace disease in the right lower lung field with no interval improvement or worsening. Left lung is clear. There is no substantial pleural effusion. No new osseous  findings. IMPRESSION: 1. Interval removal of right chest tube. No measurable pneumothorax. 2. Patchy airspace disease in the right lower lung field with no interval improvement or worsening. 3. Cardiomegaly with mild central vascular congestion but with improvement. Electronically Signed   By: Almira Bar M.D.   On: 11/24/2023 06:38   DG Chest Port 1 View Result Date: 11/23/2023 CLINICAL DATA:  Pneumothorax. EXAM: PORTABLE CHEST 1 VIEW COMPARISON:  Earlier radiograph dated 11/23/2023. FINDINGS: Support lines and tubes in similar position. Interval progression bilateral pulmonary opacities. No large pleural effusion. No pneumothorax. Stable cardiac silhouette. No acute osseous pathology. IMPRESSION: 1. Interval progression of bilateral pulmonary opacities. 2. No pneumothorax. Electronically Signed   By: Elgie Collard M.D.   On: 11/23/2023 16:24   DG Chest Port 1 View Result Date: 11/23/2023 CLINICAL DATA:  72 year old female with history of pneumothorax. EXAM: PORTABLE CHEST 1 VIEW COMPARISON:  Multiple priors, most recently 11/22/2023. FINDINGS: An endotracheal tube is in place with tip 3.6 cm above the carina. Left internal jugular Vas-Cath with tip projecting over the right atrium. Nasogastric tube extends into the antral pre-pyloric region of the stomach. Small bore right-sided chest tube with pigtail reformed over the medial aspect of the right hemithorax. Lung volumes are low. Widespread interstitial prominence and peribronchial cuffing throughout both lungs with some patchy ill-defined opacities throughout the right lung, concerning for bronchitis with right-sided bronchopneumonia. No definite pleural effusions. No appreciable pneumothorax. No evidence of pulmonary edema. Heart size is mildly enlarged. Upper mediastinal contours are within normal limits allowing for patient positioning. Status post right shoulder arthroplasty. IMPRESSION: 1. Support apparatus and postoperative changes, as above.  2. The appearance of the lungs is again most compatible with a background of bronchitis and right-sided multilobar bronchopneumonia. Overall, aeration appears similar to the recent prior study. Electronically Signed   By: Trudie Reed M.D.   On: 11/23/2023 05:48     Assessment and Plan:   New onset atrial flutter with rapid ventricular response. -Continue heparin drip, IV amiodarone -Heart rate is improving. -Echo with normal EF. -If heart rate difficult to control, continue as needed IV Lopressor.. -NOAC prior to discharge, p.o. Amio when heart rate control and able to take p.o.  2.  Hypertension -Amio,  IV Lopressor for now. -P.o. meds when able to tolerate p.o.  3.  CKD 4 -Avoid nephrotoxic's -Agement as per nephrology   Signed, Debbe Odea, MD  11/26/2023 5:11 PM

## 2023-11-26 NOTE — Progress Notes (Signed)
Subjective: 8 Days Post-Op Procedure(s) (LRB): Right revision reverse shoulder arthroplasty (conversion to longstem and glenosphere exchange) (Right) Patient in the ICU.  Patient extubated.   Status post cardiac arrest with tension pneumothorax.  Objective: Vital signs in last 24 hours: Temp:  [97.8 F (36.6 C)-98.3 F (36.8 C)] 98.3 F (36.8 C) (12/14 0748) Pulse Rate:  [67-155] 141 (12/14 0700) Resp:  [14-27] 24 (12/14 0700) BP: (105-162)/(54-83) 107/83 (12/14 0700) SpO2:  [92 %-100 %] 96 % (12/14 0700) FiO2 (%):  [38 %-40 %] 40 % (12/14 0748) Weight:  [80.4 kg] 80.4 kg (12/14 0500)  Intake/Output from previous day: 12/13 0701 - 12/14 0700 In: -  Out: 2700 [Urine:2700] Intake/Output this shift: No intake/output data recorded.  Recent Labs    11/24/23 0448 11/25/23 0152 11/26/23 0434  HGB 7.5* 8.6* 8.7*   Recent Labs    11/25/23 0152 11/26/23 0434  WBC 11.0* 10.8*  RBC 3.02* 3.15*  HCT 28.3* 28.7*  PLT 274 344   Recent Labs    11/25/23 0152 11/26/23 0434  NA 137 143  K 4.6 4.6  CL 103 107  CO2 20* 22  BUN 90* 83*  CREATININE 2.74* 2.58*  GLUCOSE 92 92  CALCIUM 8.9 9.7   No results for input(s): "LABPT", "INR" in the last 72 hours.   EXAM General - Patient is on high flow O2, mildly awake. Extremity - Intact pulses distally No cellulitis present Compartment soft Dressing intact and dry Dressing - Slight increased drainage.  Mild hemophage and purulence at mid incision.  New dressing applied.  Sling removed.  Pillow supporting arm.     Past Medical History:  Diagnosis Date   Acute hypoxemic respiratory failure (HCC)    Anemia in stage 4 chronic kidney disease (HCC)    Anginal pain (HCC)    Anxiety    Aortic atherosclerosis (HCC)    CHF (congestive heart failure) (HCC)    a.) TTE 09/30/2021: EF 55-60%, no RWMAs, mild MR, G1DD   Chronic pain    Chronic radicular pain of lower back    CKD (chronic kidney disease), stage IV (HCC)     Complication of anesthesia    a.) delayed emergence following ureteroscopy 07/2023   Coronary artery calcification seen on CT scan    Costochondritis    DDD (degenerative disc disease), lumbar    Depression    Dyspnea    GERD (gastroesophageal reflux disease)    Hiatal hernia    Hip dysplasia    Hyperlipidemia    Hypertension    Insomnia    Iron deficiency    Iron deficiency anemia    Low back pain    Low vitamin B12 level    Lumbar facet joint pain    Migraines    Nose colonized with MRSA 10/03/2020   a.) preop PCR (+) 10/03/2020 prior to RIGHT TKA; b.) preop PCR (+) 08/25/2023 prior to RIGHT REVERSE SHOULDER ARTHROPLASTY; BICEPS TENODESIS   Numbness and tingling of right leg    OA (osteoarthritis)    Obesity    OSA (obstructive sleep apnea)    a.) not currently utilizing nocturnal PAP therapy; positional. PCCM feels as if symptom can be mitigated with diet/exercise/weight loss unless develops significant symptoms.   Osteoporosis    a.) recieves denosumab injections   Pelvic fracture (HCC)    Pneumonia    Pulmonary fibrosis (HCC)    Raynaud's disease without gangrene    Restless leg syndrome    a.) on pramipexole +  oral Fe supplementation   Rheumatoid arthritis (HCC)    a.) Tx'd with hydroxychloroquine   Right renal mass 05/28/2023   a.) CT renal 05/28/2023:  5.7 cm mass in the medial aspect of right kidney   Seasonal allergies    Thoracic compression fracture (HCC)     Assessment/Plan:   8 Days Post-Op Procedure(s) (LRB): Right revision reverse shoulder arthroplasty (conversion to longstem and glenosphere exchange) (Right) Principal Problem:   Periprosthetic fracture around internal prosthetic right shoulder joint Active Problems:   Hypertension   Seropositive rheumatoid arthritis (HCC)   Acute respiratory failure with hypoxia (HCC)   Septic arthritis (HCC)   Acute on chronic anemia   (HFpEF) heart failure with preserved ejection fraction (HCC)   CKD (chronic  kidney disease) stage 4, GFR 15-29 ml/min (HCC)   ILD (interstitial lung disease) (HCC)   Tension pneumothorax   Cardiac arrest, cause unspecified (HCC)  Estimated body mass index is 31.4 kg/m as calculated from the following:   Height as of this encounter: 5\' 3"  (1.6 m).   Weight as of this encounter: 80.4 kg.  Vital signs are stable  Labs are stable, Hgb 8.7, WBC 10.8 and stable  Continue with IV abx, cultures pending.  Appreciate ID consult and following.  Chest tube removed 12/11.  Possible extubation today.    DVT Prophylaxis - Lovenox, TED hose, and SCDs    Dedra Skeens, PA-C [facility] 11/26/2023, 7:58 AM

## 2023-11-26 NOTE — Plan of Care (Signed)
  Problem: Activity: Goal: Risk for activity intolerance will decrease Outcome: Progressing   Problem: Coping: Goal: Level of anxiety will decrease Outcome: Progressing   Problem: Pain Management: Goal: General experience of comfort will improve Outcome: Progressing   Problem: Safety: Goal: Ability to remain free from injury will improve Outcome: Progressing

## 2023-11-26 NOTE — Progress Notes (Signed)
[facility]  ROUNDING NOTE   Subjective:   Tonya Cummings is a 72  y.o. female with past medical conditions including chronic kidney disease stage IV, proteinuria, hypertension, anemia of chronic kidney disease, RA, OSA and secondary hyperparathyroidism. Patient presented to the hospital for planned right shoulder arthroplasty which was later complicated by PEA cardiac arrest.  Patient is known to our practice and is followed outpatient by Dr. Cherylann Ratel.  Patient is seen and evaluated at bedside in ICU.  Patient alert and oriented, high flow nasal cannula in place.  Family at bedside.  Ill-appearing, appears uncomfortable, restless.  Heparin drip in place.    Objective:  Vital signs in last 24 hours:  Temp:  [97.8 F (36.6 C)-98.3 F (36.8 C)] 98.3 F (36.8 C) (12/14 0748) Pulse Rate:  [67-155] 141 (12/14 0700) Resp:  [14-26] 24 (12/14 0700) BP: (105-162)/(54-83) 107/83 (12/14 0700) SpO2:  [92 %-100 %] 100 % (12/14 0810) FiO2 (%):  [38 %-40 %] 40 % (12/14 0748) Weight:  [80.4 kg] 80.4 kg (12/14 0500)  Weight change:  Filed Weights   11/22/23 0459 11/23/23 0500 11/26/23 0500  Weight: 80.8 kg 80.4 kg 80.4 kg    Intake/Output: I/O last 3 completed shifts: In: 474 [I.V.:196.7; IV Piggyback:277.4] Out: 3700 [Urine:3700]   Intake/Output this shift:  No intake/output data recorded.  Physical Exam: General: Ill appearing, restless  Head: Normocephalic, atraumatic. Moist oral mucosal membranes  Eyes: Anicteric  Lungs:  Rhonchus, coarse, HFNC  Heart: Regular rate and rhythm  Abdomen:  Soft, nontender  Extremities:  No peripheral edema.  Neurologic: Nonfocal, moving all four extremities  Skin: No lesions       Basic Metabolic Panel: Recent Labs  Lab 11/22/23 0407 11/23/23 0544 11/24/23 0448 11/25/23 0152 11/26/23 0434  NA 140 136 137 137 143  K 3.9 3.9 4.5 4.6 4.6  CL 104 101 104 103 107  CO2 21* 22 25 20* 22  GLUCOSE 96 155* 146* 92 92  BUN 72* 76*  84* 90* 83*  CREATININE 2.96* 2.88* 2.61* 2.74* 2.58*  CALCIUM 8.7* 8.6* 8.5* 8.9 9.7  MG 2.0 2.0 2.1 2.1 2.4  PHOS 5.2* 4.1 3.4 5.0* 4.8*    Liver Function Tests: Recent Labs  Lab 11/19/23 1310 11/19/23 2039 11/20/23 0252 11/21/23 0402 11/22/23 0407 11/26/23 0434  AST 96* 122* 102* 51* 36  --   ALT 55* 54* 41 20 16  --   ALKPHOS 135* 124 115 120 152*  --   BILITOT 0.9 0.6 0.7 0.8 0.9  --   PROT 6.9 6.7 6.3* 6.1* 6.0*  --   ALBUMIN 2.8* 2.6* 2.6* 2.4* 2.1* 2.4*   No results for input(s): "LIPASE", "AMYLASE" in the last 168 hours. No results for input(s): "AMMONIA" in the last 168 hours.  CBC: Recent Labs  Lab 11/19/23 1310 11/19/23 2039 11/22/23 0407 11/23/23 0544 11/24/23 0448 11/25/23 0152 11/26/23 0434  WBC 14.8*   < > 14.6* 8.8 8.8 11.0* 10.8*  NEUTROABS 9.3*  --   --   --   --   --   --   HGB 8.7*   < > 7.4* 7.2* 7.5* 8.6* 8.7*  HCT 29.1*   < > 23.4* 22.8* 23.9* 28.3* 28.7*  MCV 96.7   < > 92.9 91.2 89.2 93.7 91.1  PLT 334   < > 230 227 234 274 344   < > = values in this interval not displayed.    Cardiac Enzymes: Recent Labs  Lab  11/19/23 1310 11/19/23 2039 11/23/23 0544  CKTOTAL 134 291* 35*    BNP: Invalid input(s): "POCBNP"  CBG: Recent Labs  Lab 11/25/23 1557 11/25/23 1951 11/25/23 2325 11/26/23 0353 11/26/23 0803  GLUCAP 89 96 79 70 90    Microbiology: Results for orders placed or performed during the hospital encounter of 11/18/23  Surgical pcr screen     Status: Abnormal   Collection Time: 11/18/23 12:11 PM   Specimen: Nasal Mucosa; Nasal Swab  Result Value Ref Range Status   MRSA, PCR NEGATIVE NEGATIVE Final   Staphylococcus aureus POSITIVE (A) NEGATIVE Final    Comment: (NOTE) The Xpert SA Assay (FDA approved for NASAL specimens in patients 6 years of age and older), is one component of a comprehensive surveillance program. It is not intended to diagnose infection nor to guide or monitor treatment. Performed at [facility], [address], [place], [place] [address]   Aerobic/Anaerobic Culture w Gram Stain (surgical/deep wound)     Status: None (Preliminary result)   Collection Time: 11/18/23  2:29 PM   Specimen: Path Tissue  Result Value Ref Range Status   Specimen Description   Final    TISSUE Performed at [facility], [address] [place], [place] [address]    Special Requests RT HUMERUS HOLD 21DAYS  Final   Gram Stain   Final    RARE WBC PRESENT,BOTH PMN AND MONONUCLEAR NO ORGANISMS SEEN    Culture   Final    NO GROWTH 8 DAYS CONTINUING TO HOLD Performed at [facility], [address], [place], [place] [address]    Report Status PENDING  Incomplete  Aerobic/Anaerobic Culture w Gram Stain (surgical/deep wound)     Status: None (Preliminary result)   Collection Time: 11/18/23  2:43 PM   Specimen: Path Tissue  Result Value Ref Range Status   Specimen Description   Final    TISSUE Performed at [facility], [address] [place], [place] [address]    Special Requests TUBEROSITY,HOLD 21DAYS  Final   Gram Stain   Final    FEW WBC PRESENT,BOTH PMN AND MONONUCLEAR NO ORGANISMS SEEN    Culture   Final    NO GROWTH 8 DAYS CONTINUING TO HOLD Performed at [facility], [address] [place], [place] [address]    Report Status PENDING  Incomplete  Aerobic/Anaerobic Culture w Gram Stain (surgical/deep wound)     Status: None (Preliminary result)   Collection Time: 11/18/23  2:52 PM   Specimen: Path Tissue  Result Value Ref Range Status   Specimen Description   Final    TISSUE Performed at [facility], [address] [place], [place] [address]    Special Requests RT HUMERAL TRAY HOLD 21 DAYS  Final   Gram Stain   Final    RARE WBC PRESENT, PREDOMINANTLY MONONUCLEAR NO ORGANISMS SEEN    Culture   Final    NO GROWTH 8 DAYS CONTINUING TO HOLD Performed at [facility], [address] [place], [place] [address]    Report  Status PENDING  Incomplete  Aerobic/Anaerobic Culture w Gram Stain (surgical/deep wound)     Status: None (Preliminary result)   Collection Time: 11/18/23  2:58 PM   Specimen: Path Tissue  Result Value Ref Range Status   Specimen Description   Final    TISSUE Performed at [facility], [address], [place], [place] [address]    Special Requests  GLENOSPHERE,HOLD 21 DAYS  Final   Gram Stain   Final    RARE WBC PRESENT,BOTH PMN AND MONONUCLEAR NO ORGANISMS SEEN    Culture   Final    NO GROWTH 8 DAYS CONTINUING TO HOLD Performed at [facility], [address], [place], [place] [address]    Report Status PENDING  Incomplete  Aerobic/Anaerobic Culture w Gram Stain (surgical/deep wound)     Status: None (Preliminary result)   Collection Time: 11/18/23  3:06 PM   Specimen: Path Tissue  Result Value Ref Range Status   Specimen Description   Final    TISSUE Performed at [facility], [address] [place], [place] [address]    Special Requests RT HUMEREUS CANAL HOLD 21DAYS  Final   Gram Stain   Final    FEW WBC PRESENT, PREDOMINANTLY MONONUCLEAR NO ORGANISMS SEEN    Culture   Final    NO GROWTH 8 DAYS CONTINUING TO HOLD Performed at [facility], [address] [place], [place] [address]    Report Status PENDING  Incomplete  Culture, blood (Routine X 2) w Reflex to ID Panel     Status: None   Collection Time: 11/18/23  7:22 PM   Specimen: BLOOD  Result Value Ref Range Status   Specimen Description BLOOD BLOOD LEFT HAND  Final   Special Requests   Final    BOTTLES DRAWN AEROBIC AND ANAEROBIC Blood Culture adequate volume   Culture   Final    NO GROWTH 5 DAYS Performed at [facility], [address], [place], [place] [address]    Report Status 11/23/2023 FINAL  Final  Culture, blood (Routine X 2) w Reflex to ID Panel     Status: None   Collection Time: 11/18/23  7:28 PM   Specimen: BLOOD  Result Value Ref Range Status    Specimen Description BLOOD BLOOD LEFT ARM  Final   Special Requests   Final    BOTTLES DRAWN AEROBIC AND ANAEROBIC Blood Culture adequate volume   Culture   Final    NO GROWTH 5 DAYS Performed at [facility], [address], [place], [place] [address]    Report Status 11/23/2023 FINAL  Final  MRSA Next Gen by PCR, Nasal     Status: None   Collection Time: 11/19/23  1:57 PM   Specimen: Nasal Mucosa; Nasal Swab  Result Value Ref Range Status   MRSA by PCR Next Gen NOT DETECTED NOT DETECTED Final    Comment: (NOTE) The GeneXpert MRSA Assay (FDA approved for NASAL specimens only), is one component of a comprehensive MRSA colonization surveillance program. It is not intended to diagnose MRSA infection nor to guide or monitor treatment for MRSA infections. Test performance is not FDA approved in patients less than 8 years old. Performed at [facility], [address] [place], [place] [address]   Culture, Respiratory w Gram Stain     Status: None   Collection Time: 11/21/23  3:18 PM   Specimen: Tracheal Aspirate; Respiratory  Result Value Ref Range Status   Specimen Description   Final    TRACHEAL ASPIRATE Performed at [facility], [address], [place], [place] [address]    Special Requests   Final    NONE Performed at [facility], [address] [place], [place] [address]    Gram Stain   Final    ABUNDANT WBC PRESENT, PREDOMINANTLY PMN RARE GRAM POSITIVE COCCI IN  PAIRS RARE GRAM NEGATIVE RODS    Culture   Final    FEW Normal respiratory flora-no Staph aureus or Pseudomonas seen Performed at [facility], [address], [place], [place] [address]    Report Status 11/23/2023 FINAL  Final    Coagulation Studies: No results for input(s): "LABPROT", "INR" in the last 72 hours.   Urinalysis: No results for input(s): "COLORURINE", "LABSPEC", "PHURINE", "GLUCOSEU", "HGBUR", "BILIRUBINUR", "KETONESUR", "PROTEINUR",  "UROBILINOGEN", "NITRITE", "LEUKOCYTESUR" in the last 72 hours.  Invalid input(s): "APPERANCEUR"    Imaging: DG Chest Port 1 View Result Date: 11/26/2023 CLINICAL DATA:  100[address] with shortness of breath. EXAM: PORTABLE CHEST 1 VIEW COMPARISON:  Portable chest 11/24/2023 at 4:30 a.m. FINDINGS: 6:07 a.m. ETT/NGT interval removal. Right IJ dialysis catheter tip remains in the upper right atrium. Stable enlargement of the cardiac silhouette. Unchanged mild central vascular prominence. There is increased streaky atelectasis or infiltrate in the retrocardiac left lower lobe. There is increased patchy airspace disease in the right upper lobe with unchanged denser consolidation right lower lung field. The left upper lung field remains clear. There are small pleural effusions. Osteopenia. No new osseous findings with right shoulder replacement and overlying skin staples again shown. In all other respects no other changes are seen. IMPRESSION: 1. Increased patchy airspace disease in the right upper lobe with unchanged denser consolidation right lower lung field. 2. Increased streaky atelectasis or infiltrate in the retrocardiac left lower lobe. 3. Small pleural effusions. 4. Stable cardiomegaly and mild central vascular prominence. 5. Interval removal of ETT/NGT. Electronically Signed   By: Almira Bar M.D.   On: 11/26/2023 06:29     Medications:    amiodarone 60 mg/hr (11/26/23 0649)   Followed by   amiodarone     cefTRIAXone (ROCEPHIN)  IV 2 g (11/25/23 1736)   DAPTOmycin Stopped (11/24/23 1456)   heparin 1,700 Units/hr (11/25/23 0600)    aspirin  325 mg Per Tube Daily   calcitRIOL  0.25 mcg Per Tube q morning   calcium-vitamin D  1 tablet Per Tube TID   Chlorhexidine Gluconate Cloth  6 each Topical Daily   cholecalciferol  1,000 Units Per Tube q morning   cyanocobalamin  1,000 mcg Per Tube q morning   ferrous sulfate  300 mg Per Tube Daily   lidocaine  1 patch Transdermal Q24H   mupirocin  ointment   Topical BID   pramipexole  0.125 mg Per Tube q morning   And   pramipexole  0.25 mg Per Tube QHS   sodium chloride flush  3 mL Intravenous Q12H   sodium chloride flush  3 mL Intravenous Q12H   sodium chloride HYPERTONIC  4 mL Nebulization BID   sterile water (preservative free)       bisacodyl, HYDROmorphone (DILAUDID) injection, ipratropium-albuterol, metoprolol tartrate, mouth rinse, mouth rinse, mouth rinse, oxyCODONE, polyethylene glycol, senna-docusate, sterile water (preservative free)  Assessment/ Plan:  Tonya Cummings is a 72 y.o.  female with past medical conditions including chronic kidney disease stage IV, proteinuria, hypertension, anemia of chronic kidney disease, RA, OSA and secondary hyperparathyroidism. Patient presented to the hospital for planned right shoulder arthroplasty which was later complicated by PEA cardiac arrest.  She is currently admitted for Periprosthetic fracture around internal prosthetic right shoulder joint [M97.31XA] Septic arthritis (HCC) [M00.9]   Acute Kidney Injury on chronic kidney disease stage 4 with baseline creatinine 2.08 on 10/04/2023.  Acute kidney injury secondary to hypoperfusion.  Chronic kidney disease secondary  to hypertension and rheumatoid arthritis with prior NSAID use.  No IV contrast exposure.   Continue to avoid hypotension and other nephrotoxic agents and therapies.  No acute indication for dialysis.   Lab Results  Component Value Date   CREATININE 2.58 (H) 11/26/2023   CREATININE 2.74 (H) 11/25/2023   CREATININE 2.61 (H) 11/24/2023    Intake/Output Summary (Last 24 hours) at 11/26/2023 0943 Last data filed at 11/26/2023 0600 Gross per 24 hour  Intake --  Output 2700 ml  Net -2700 ml   2. Anemia of chronic kidney disease with acute blood loss Lab Results  Component Value Date   HGB 8.7 (L) 11/26/2023  Patient has received blood transfusions during this admission.   3. Secondary Hyperparathyroidism:  with outpatient labs: PTH 45, phosphorus 4.7, calcium 9.4 on 11/07/23.   Lab Results  Component Value Date   CALCIUM 9.7 11/26/2023   PHOS 4.8 (H) 11/26/2023    Will continue to monitor bone minerals during this admission.  Continue calcitriol.     LOS: 8 Ty Buntrock 12/14/20249:43 AM

## 2023-11-26 NOTE — Progress Notes (Signed)
Progress Note   Patient: Tonya Cummings ZOX:[id number] DOB: Dec 23, 1950 DOA: 11/18/2023     8 DOS: the patient was seen and examined on 11/26/2023   Brief hospital course:  72 year old female patient with a past medical history of CKD stage IV, heart failure with preserved EF, hypertension, rheumatoid arthritis, OSA not on CPAP presents to [facility] on 12/06 for right reverse shoulder arthroplasty after for suspicion of septic joint. She initially underwent right shoulder arthroplasty on 08/30/2023 after which she developed a.  Periprosthetic fracture status post conversion to a longstem humeral component with ORIF on 10/03/2023.  No evidence of infection at the time. Follow-up lab work showed elevated inflammatory markers including ESR suspicious for an infectious process.  Therefore she presents back on 12/06 for shoulder arthroplasty explant of glenoid and humeral component open treatment of right humerus fracture with intramedullary device and insertion of antibiotic delivery system.  Small area of purulence was noted.  Cultures were sent.  Started on daptomycin and ceftriaxone.   She did well immediately postop however on 12/07 she was getting up to the bedside commode she went into respiratory distress with O2 sat dropping becoming apneic and went into PEA arrest.  CPR for roughly 5 to 6-minute prior to ROSC epi x 2.  Not shockable rhythm.  Patient intubated and transferred to the ICU for further care.  Postintubation chest x-ray with right pneumothorax status post chest tube placement 12/07.  HD catheter placed 12/07 anticipating need for dialysis given worsening kidney function.   During ICU stay patient had circulatory shock requiring pressors.  She has elevated troponin in the setting of cardiac arrest, new onset A-fib.  Echocardiogram 12/08 with normal LVEF 55 to 60%.  RV systolic function is normal and size is normal. Ultrasound venous lower extremity 12/08 negative  for DVT. Patient had pneumothorax 12/7 for which a right chest tube was placed which is removed 12/11.  Her kidney function remained stable and nephrologist followed advised no acute indication for dialysis. She is successfully extubated 12/13 and transferred to [facility] service.   Assessment and Plan: PEA arrest NSTEMI New onset A-fib: Circulatory shock, off pressors now. She is on heparin gtt. Echo showed EF 55-60%.  Overnight restarted on amiodarone for rapid A-fib. Cardiology consult placed for further management and evaluation.  Acute hypoxic respiratory failure Right pneumothorax Status post right-sided chest tube placed 12/7, removed 12/11. Possible aspiration pneumonia Patient is currently on 2 L supplemental oxygen postextubation 12/13. Continue to monitor and wean her oxygen. Continue ceftriaxone and daptomycin therapy. Continue aggressive pulmonary toilet, DuoNebs as needed. Encourage out of bed to chair, incentive spirometry.  Acute toxic metabolic encephalopathy In the setting of PEA arrest requiring airway protection, on sedation. Patient is more alert and awake today morning.  She does have hoarse voice but able to answer me appropriately. Continue neurochecks.  Dysphagia: Hoarseness s/p extubation. SLP evaluation and follow-up. Patient will be continued on pured diet. Encourage oral diet, supplementation.  Acute on chronic kidney injury stage 4: Nephrology evaluation is appreciated. Acute kidney injury in the setting of hypoperfusion. Avoid nephrotoxic drugs.  Continue Rocaltrol, vitamin D3 per nephrology. No acute indication for dialysis.  Right reverse shoulder arthroplasty 9/17, status post revision surgery on 1021, 11/18/2023- Hardware explanted 12/6.  Currently on daptomycin and ceftriaxone per ID recommendations.  Tissue cultures so far negative.  Anemia of chronic disease: Hemoglobin stable.  No active bleeding. Continue iron, B12 supplements.  Obesity  with BMI 31.40 Contributing to  her current condition. OSA not on CPAP. Diet, exercise and weight reduction advised. Check A1c. Continue hypoglycemia protocol.    Nursing supportive care. Fall, aspiration precautions. DVT prophylaxis heparin drip   Code Status: Full Code  Subjective: Patient is seen and examined today morning. She is weak, requiring 2L supplemental O2.  Her voice is hoarse, able to answer me appropriately.  Patient remains tachycardic with heart rate around 140s.  She is started on amiodarone overnight.  Physical Exam: Vitals:   11/26/23 0900 11/26/23 1000 11/26/23 1100 11/26/23 1200  BP: 136/83 135/82 117/63 124/67  Pulse: (!) 142 (!) 128 (!) 146 (!) 142  Resp: (!) 24 18 17  (!) 21  Temp:      TempSrc:      SpO2: 98% 99% 98% 100%  Weight:      Height:        General - Elderly obese Caucasian female, tachycardia, no apparent distress HEENT - PERRLA, EOMI, atraumatic head, non tender sinuses. Lung - distant breath sounds, basal rales, rhonchi, no wheezes. Heart - S1, S2 heard, no murmurs, rubs, 1+ pedal edema. Abdomen - Soft, non tender obese, bowel sounds good Neuro - Alert, awake and oriented, moving extremities. Skin - Warm and dry.  Data Reviewed:      Latest Ref Rng & Units 11/26/2023    4:34 AM 11/25/2023    1:52 AM 11/24/2023    4:48 AM  CBC  WBC 4.0 - 10.5 K/uL 10.8  11.0  8.8   Hemoglobin 12.0 - 15.0 g/dL 8.7  8.6  7.5   Hematocrit 36.0 - 46.0 % 28.7  28.3  23.9   Platelets 150 - 400 K/uL 344  274  234       Latest Ref Rng & Units 11/26/2023    4:34 AM 11/25/2023    1:52 AM 11/24/2023    4:48 AM  BMP  Glucose 70 - 99 mg/dL 92  92  161   BUN 8 - 23 mg/dL 83  90  84   Creatinine 0.44 - 1.00 mg/dL 0.96  0.45  4.09   Sodium 135 - 145 mmol/L 143  137  137   Potassium 3.5 - 5.1 mmol/L 4.6  4.6  4.5   Chloride 98 - 111 mmol/L 107  103  104   CO2 22 - 32 mmol/L 22  20  25    Calcium 8.9 - 10.3 mg/dL 9.7  8.9  8.5    DG Chest Port 1  View Result Date: 11/26/2023 CLINICAL DATA:  10026 with shortness of breath. EXAM: PORTABLE CHEST 1 VIEW COMPARISON:  Portable chest 11/24/2023 at 4:30 a.m. FINDINGS: 6:07 a.m. ETT/NGT interval removal. Right IJ dialysis catheter tip remains in the upper right atrium. Stable enlargement of the cardiac silhouette. Unchanged mild central vascular prominence. There is increased streaky atelectasis or infiltrate in the retrocardiac left lower lobe. There is increased patchy airspace disease in the right upper lobe with unchanged denser consolidation right lower lung field. The left upper lung field remains clear. There are small pleural effusions. Osteopenia. No new osseous findings with right shoulder replacement and overlying skin staples again shown. In all other respects no other changes are seen. IMPRESSION: 1. Increased patchy airspace disease in the right upper lobe with unchanged denser consolidation right lower lung field. 2. Increased streaky atelectasis or infiltrate in the retrocardiac left lower lobe. 3. Small pleural effusions. 4. Stable cardiomegaly and mild central vascular prominence. 5. Interval removal of ETT/NGT. Electronically Signed   By:  Almira Bar M.D.   On: 11/26/2023 06:29     Family Communication: Discussed with husband over phone, he understand and agree. All questions answereed.  Disposition: Status is: Inpatient Remains inpatient appropriate because: A-fib with RVR, IV antibiotics, IV heparin, cardiology consult  Planned Discharge Destination: Home with Home Health     MDM level 3 - Patient is sick with rapid A-fib, s/p extubation yesterday.  She is transferred out of ICU and she will need close hemodynamic, neurologic and telemetry monitoring.  She is at high risk for sudden clinical deterioration.  Author: Marcelino Duster, MD 11/26/2023 2:10 PM Secure chat 7am to 7pm For on call review [email or web address].

## 2023-11-26 NOTE — Plan of Care (Signed)
  Problem: Health Behavior/Discharge Planning: Goal: Ability to manage health-related needs will improve Outcome: Progressing   Problem: Clinical Measurements: Goal: Ability to maintain clinical measurements within normal limits will improve Outcome: Progressing Goal: Will remain free from infection Outcome: Progressing Goal: Diagnostic test results will improve Outcome: Progressing Goal: Respiratory complications will improve Outcome: Progressing Goal: Cardiovascular complication will be avoided Outcome: Progressing   Problem: Activity: Goal: Risk for activity intolerance will decrease Outcome: Progressing   Problem: Nutrition: Goal: Adequate nutrition will be maintained Outcome: Progressing   Problem: Coping: Goal: Level of anxiety will decrease Outcome: Progressing   Problem: Elimination: Goal: Will not experience complications related to bowel motility Outcome: Progressing Goal: Will not experience complications related to urinary retention Outcome: Progressing   Problem: Pain Management: Goal: General experience of comfort will improve Outcome: Progressing   Problem: Safety: Goal: Ability to remain free from injury will improve Outcome: Progressing   Problem: Skin Integrity: Goal: Risk for impaired skin integrity will decrease Outcome: Progressing   Problem: Education: Goal: Knowledge of the prescribed therapeutic regimen will improve Outcome: Progressing Goal: Understanding of activity limitations/precautions following surgery will improve Outcome: Progressing Goal: Individualized Educational Video(s) Outcome: Progressing   Problem: Activity: Goal: Ability to tolerate increased activity will improve Outcome: Progressing   Problem: Pain Management: Goal: Pain level will decrease with appropriate interventions Outcome: Progressing   Problem: Activity: Goal: Ability to tolerate increased activity will improve Outcome: Progressing   Problem:  Respiratory: Goal: Ability to maintain a clear airway and adequate ventilation will improve Outcome: Progressing   Problem: Role Relationship: Goal: Method of communication will improve Outcome: Progressing

## 2023-11-26 NOTE — Consult Note (Signed)
PHARMACY - ANTICOAGULATION CONSULT NOTE  Pharmacy Consult for IV Heparin Indication: atrial fibrillation  Patient Measurements: Height: 5\' 3"  (160 cm) Weight: 80.4 kg (177 lb 4 oz) IBW/kg (Calculated) : 52.4 Heparin Dosing Weight: 70 kg  Labs: Recent Labs    11/23/23 0544 11/23/23 1539 11/24/23 0448 11/24/23 0920 11/24/23 1817 11/25/23 0152 11/26/23 0434  HGB 7.2*  --  7.5*  --   --  8.6* 8.7*  HCT 22.8*  --  23.9*  --   --  28.3* 28.7*  PLT 227  --  234  --   --  274 344  HEPARINUNFRC 0.13*   < >  --    < > 0.42 0.39 0.42  CREATININE 2.88*  --  2.61*  --   --  2.74*  --   CKTOTAL 35*  --   --   --   --   --   --    < > = values in this interval not displayed.   Estimated Creatinine Clearance: 19.2 mL/min (A) (by C-G formula based on SCr of 2.74 mg/dL (H)).  Medical History: Past Medical History:  Diagnosis Date   Acute hypoxemic respiratory failure (HCC)    Anemia in stage 4 chronic kidney disease (HCC)    Anginal pain (HCC)    Anxiety    Aortic atherosclerosis (HCC)    CHF (congestive heart failure) (HCC)    a.) TTE 09/30/2021: EF 55-60%, no RWMAs, mild MR, G1DD   Chronic pain    Chronic radicular pain of lower back    CKD (chronic kidney disease), stage IV (HCC)    Complication of anesthesia    a.) delayed emergence following ureteroscopy 07/2023   Coronary artery calcification seen on CT scan    Costochondritis    DDD (degenerative disc disease), lumbar    Depression    Dyspnea    GERD (gastroesophageal reflux disease)    Hiatal hernia    Hip dysplasia    Hyperlipidemia    Hypertension    Insomnia    Iron deficiency    Iron deficiency anemia    Low back pain    Low vitamin B12 level    Lumbar facet joint pain    Migraines    Nose colonized with MRSA 10/03/2020   a.) preop PCR (+) 10/03/2020 prior to RIGHT TKA; b.) preop PCR (+) 08/25/2023 prior to RIGHT REVERSE SHOULDER ARTHROPLASTY; BICEPS TENODESIS   Numbness and tingling of right leg    OA  (osteoarthritis)    Obesity    OSA (obstructive sleep apnea)    a.) not currently utilizing nocturnal PAP therapy; positional. PCCM feels as if symptom can be mitigated with diet/exercise/weight loss unless develops significant symptoms.   Osteoporosis    a.) recieves denosumab injections   Pelvic fracture (HCC)    Pneumonia    Pulmonary fibrosis (HCC)    Raynaud's disease without gangrene    Restless leg syndrome    a.) on pramipexole + oral Fe supplementation   Rheumatoid arthritis (HCC)    a.) Tx'd with hydroxychloroquine   Right renal mass 05/28/2023   a.) CT renal 05/28/2023:  5.7 cm mass in the medial aspect of right kidney   Seasonal allergies    Thoracic compression fracture (HCC)    Medications:  No anticoagulation prior to admission per my chart review  Assessment: 72 y/o F with a past medical history of CKD IV, HFpEF, hypertension, RA, OSA not on CPAP who came to [facility] 12/6 for  right reverse shoulder arthroplasty after for suspicion of septic joint. Patient experienced cardiac arrest post-operatively on 12/7. She is intubated, sedated and on mechanical ventilation in the ICU. Hospital course now further complicated by paroxysmal Afib with RVR. Patient has been placed on amiodarone. Pharmacy consulted for heparin.  Baseline aPTT and PT-INR are pending. CBC is notable for anemia which is chronic.  12/10 20:29 HL <0.10 12/11 0544 HL 0.13, subtherapeutic 12/11 1539 HL 0.29, subtherapeutic 12/12 0039 HL 0.34, therapeutic x 1 12/12 0920 HL 0.28, subtherapeutic 12/12 1817 HL 0.42, therapeutic x 1 12/13 0152 HL 0.39, therapeutic x 2 12/14 0434 HL 0.42, therapeutic x 3  Goal of Therapy:  Heparin level 0.3-0.7 units/ml Monitor platelets by anticoagulation protocol: Yes   Plan:  --Continue heparin infusion at 1700 units/hr  --Recheck HL daily w/ AM labs while therapeutic --Daily CBC per protocol while on IV heparin  Otelia Sergeant, PharmD, [facility] 11/26/2023 5:15 AM

## 2023-11-26 NOTE — Progress Notes (Signed)
Speech Language Pathology Treatment: Dysphagia  Patient Details Name: Tonya Cummings MRN: [id number] DOB: [birth year] Today's Date: 11/26/2023 Time: 0950-1030 SLP Time Calculation (min) (ACUTE ONLY): 40 min  Assessment / Plan / Recommendation Clinical Impression  Pt seen for ongoing assessment of swallowing today. Pt awake, verbal but w/ Dysphonia>Aphonia d/t recent oral intubation. Cough effort/effectiveness improved - NSG noted also. Pt followed basic instructions; intermittent slower attention to task. Respiratory effort remained calm, unlabored except w/ cough. Still seemed easily distracted w/ increased motor movements in the bed, which led her to begin leaning to her Left side easily post upright/midline positioning. Pt did not attempt to correct her leaning positioning.  On [place] O2 2L; 40% fiO2; afebrile. WBC trending down.   Pt presents w/ suspected oropharyngeal phase Dysphagia w/ High risk for aspiration/aspiration pneumonia in setting of in setting of declined Pulmonary status - recent oral intubation for ~5 days, now extubated w/ Aphonia and week cough effort; Esophageal phase dysmotility w/ Hiatal Hernia, GERD, Pt also requires significant support w/ feeding at meals d/t overall weakness/illness. REST BREAKS during po intake/tasks to maintain calm breathing in setting of exertion were given.    During po trials of Applesauce/puree and tsps of Nectar liquids w/ REST BREAKS b/t trials, no immediate overt, clinical s/s of aspiration noted -- however, w/ continued trials, overt coughing noted x2 w/ Nectar liquids and x1 post puree. Respiratory effort appeared to remain calm, unlabored as at baseline w/ the exertion AND REST BREAKS given b/t trials. No decline in ANS: O2 sats 99%, RR low 20s, HR 80s. Swallowing of Applesauce appeared timely. Unsure if d/t a fatigue factor; distraction. Cough effort effective. Oral phase appeared grossly [facility] for bolus management and timely A-P transfer w/ trial  consistencies given, though min distraction w/ oral prep/acceptance noted.    Pt's body positioning worsened as time passed requiring repositioning midline w/ use of pillows.    In setting of pt's overall presentation and risk for aspiration/aspiration pneumonia, recommend continue NPO status w/ therapeutic trials of Applesauce w/ NSG/therapy post oral care; oral care frequently for hygiene and stimulation of swallowing. Aspiration precautions; feeding support and positioning.  ST services will f/u next 1-2 days w/ ongoing assessment of swallowing and trials to upgrade to least restrictive diet as appears safe for pt. Discussed w/ NP/NSG the concern for pt to be able to meet her nutrition needs orally in setting of above presentation described.       HPI HPI: Pt is a 72 y/o female with h/o Hiatal hernia, GERD, RLS, OSA, IDA, depression, Raynaud's disease, obesity, restless leg, anxiety, HLD, HTN, PAF, CKD IV, CHF, ILD, DDD, RA, right total knee arthroplasty (2021) and renal cysts who is admitted with septic joint s/p right reverse shoulder arthroplasty 12/6 complicated by PEA arrest, AKI, NSTEMI and pneumothorax 12/7. She was intubated, then extubated on 11/24/23.   Intraoperatively, it was noted that patient's joint appeared infected.    CXR: 11/24/23: Interval removal of right chest tube. No measurable pneumothorax.  2. Patchy airspace disease in the right lower lung field with no  interval improvement or worsening.  3. Cardiomegaly with mild central vascular congestion but with  improvement. Left Lung clear.      SLP Plan  Continue with current plan of care      Recommendations for follow up therapy are one component of a multi-disciplinary discharge planning process, led by the attending physician.  Recommendations may be updated based on patient status, additional functional criteria  and insurance authorization.    Recommendations  Diet recommendations:  (therapeutic trias of applesauce w/  NSG/ST) Medication Administration: Crushed with puree Supervision: Staff to assist with self feeding;Full supervision/cueing for compensatory strategies Compensations: Minimize environmental distractions;Slow rate;Small sips/bites;Lingual sweep for clearance of pocketing;Multiple dry swallows after each bite/sip Postural Changes and/or Swallow Maneuvers: Out of bed for meals;Seated upright 90 degrees;Upright 30-60 min after meal                 (Dietician f/u) Oral care BID;Oral care before and after PO;Staff/trained caregiver to provide oral care   Frequent or constant Supervision/Assistance Dysphagia, unspecified (R13.10) (recent extubation post ~5 days of intubation)     Continue with current plan of care       Tonya Som, MS, CCC-SLP Speech Language Pathologist Rehab Services; [facility] [place] [phone] (ascom) Tonya Cummings  11/26/2023, 10:35 AM

## 2023-11-26 NOTE — Progress Notes (Signed)
PHARMACY CONSULT NOTE  Pharmacy Consult for Electrolyte Monitoring and Replacement   Recent Labs: Potassium (mmol/L)  Date Value  11/26/2023 4.6   Magnesium (mg/dL)  Date Value  54/08/8118 2.4   Calcium (mg/dL)  Date Value  14/78/2956 9.7   Albumin (g/dL)  Date Value  21/30/8657 2.4 (L)   Phosphorus (mg/dL)  Date Value  84/69/6295 4.8 (H)   Sodium (mmol/L)  Date Value  11/26/2023 143  09/12/2023 142   Assessment: 72 y.o. female with medical history significant of CKD Stage 4, HFpEF, HTN, B12 deficiency, anemia of CKD, rheumatoid arthritis, Raynaud's phenomenon, OSA not on CPAP, who was admitted on 12/6 for recurrent R shoulder revision following arthroplasty. Pt is on amio infusion.   Goal of Therapy:  K >= 4, Mg >= 2  Plan:  No replacement needed F/u with AM labs.   Ronnald Ramp, PharmD, BCPS 11/26/2023 8:29 AM

## 2023-11-27 DIAGNOSIS — M009 Pyogenic arthritis, unspecified: Secondary | ICD-10-CM | POA: Diagnosis not present

## 2023-11-27 DIAGNOSIS — I4891 Unspecified atrial fibrillation: Secondary | ICD-10-CM | POA: Diagnosis not present

## 2023-11-27 DIAGNOSIS — I469 Cardiac arrest, cause unspecified: Secondary | ICD-10-CM | POA: Diagnosis not present

## 2023-11-27 DIAGNOSIS — I10 Essential (primary) hypertension: Secondary | ICD-10-CM | POA: Diagnosis not present

## 2023-11-27 DIAGNOSIS — J930 Spontaneous tension pneumothorax: Secondary | ICD-10-CM | POA: Diagnosis not present

## 2023-11-27 LAB — RENAL FUNCTION PANEL
Albumin: 2.3 g/dL — ABNORMAL LOW (ref 3.5–5.0)
Anion gap: 14 (ref 5–15)
BUN: 83 mg/dL — ABNORMAL HIGH (ref 8–23)
CO2: 24 mmol/L (ref 22–32)
Calcium: 9.2 mg/dL (ref 8.9–10.3)
Chloride: 106 mmol/L (ref 98–111)
Creatinine, Ser: 2.52 mg/dL — ABNORMAL HIGH (ref 0.44–1.00)
GFR, Estimated: 20 mL/min — ABNORMAL LOW (ref >60)
Glucose, Bld: 119 mg/dL — ABNORMAL HIGH (ref 70–99)
Phosphorus: 5 mg/dL — ABNORMAL HIGH (ref 2.5–4.6)
Potassium: 4.3 mmol/L (ref 3.5–5.1)
Sodium: 144 mmol/L (ref 135–145)

## 2023-11-27 LAB — GLUCOSE, CAPILLARY
Glucose-Capillary: 109 mg/dL — ABNORMAL HIGH (ref 70–99)
Glucose-Capillary: 119 mg/dL — ABNORMAL HIGH (ref 70–99)
Glucose-Capillary: 100 mg/dL — ABNORMAL HIGH (ref 70–99)
Glucose-Capillary: 138 mg/dL — ABNORMAL HIGH (ref 70–99)
Glucose-Capillary: 101 mg/dL — ABNORMAL HIGH (ref 70–99)
Glucose-Capillary: 104 mg/dL — ABNORMAL HIGH (ref 70–99)

## 2023-11-27 LAB — CBC
HCT: 26.4 % — ABNORMAL LOW (ref 36.0–46.0)
Hemoglobin: 7.9 g/dL — ABNORMAL LOW (ref 12.0–15.0)
MCH: 27.7 pg (ref 26.0–34.0)
MCHC: 29.9 g/dL — ABNORMAL LOW (ref 30.0–36.0)
MCV: 92.6 fL (ref 80.0–100.0)
Platelets: 374 10*3/uL (ref 150–400)
RBC: 2.85 MIL/uL — ABNORMAL LOW (ref 3.87–5.11)
RDW: 16.9 % — ABNORMAL HIGH (ref 11.5–15.5)
WBC: 12.6 10*3/uL — ABNORMAL HIGH (ref 4.0–10.5)
nRBC: 0.6 % — ABNORMAL HIGH (ref 0.0–0.2)

## 2023-11-27 LAB — HEMOGLOBIN A1C
Hgb A1c MFr Bld: 5.4 % (ref 4.8–5.6)
Mean Plasma Glucose: 108.28 mg/dL

## 2023-11-27 LAB — HEPARIN LEVEL (UNFRACTIONATED): Heparin Unfractionated: 0.31 [IU]/mL (ref 0.30–0.70)

## 2023-11-27 NOTE — Progress Notes (Signed)
Subjective: 9 Days Post-Op Procedure(s) (LRB): Right revision reverse shoulder arthroplasty (conversion to longstem and glenosphere exchange) (Right) Patient in the ICU.  Patient more alert this morning.   Status post cardiac arrest with tension pneumothorax.  Objective: Vital signs in last 24 hours: Temp:  [97.9 F (36.6 C)-98.6 F (37 C)] 98.6 F (37 C) (12/15 0325) Pulse Rate:  [49-159] 80 (12/15 0700) Resp:  [13-27] 27 (12/15 0700) BP: (106-174)/(48-118) 134/73 (12/15 0700) SpO2:  [90 %-100 %] 96 % (12/15 0700) FiO2 (%):  [40 %] 40 % (12/14 0748) Weight:  [73.6 kg] 73.6 kg (12/15 0552)  Intake/Output from previous day: 12/14 0701 - 12/15 0700 In: 1834.9 [P.O.:180; I.V.:1354.9; IV Piggyback:299.9] Out: 2100 [Urine:2100] Intake/Output this shift: No intake/output data recorded.  Recent Labs    11/25/23 0152 11/26/23 0434 11/27/23 0322  HGB 8.6* 8.7* 7.9*   Recent Labs    11/26/23 0434 11/27/23 0322  WBC 10.8* 12.6*  RBC 3.15* 2.85*  HCT 28.7* 26.4*  PLT 344 374   Recent Labs    11/26/23 0434 11/27/23 0322  NA 143 144  K 4.6 4.3  CL 107 106  CO2 22 24  BUN 83* 83*  CREATININE 2.58* 2.52*  GLUCOSE 92 119*  CALCIUM 9.7 9.2   No results for input(s): "LABPT", "INR" in the last 72 hours.   EXAM General - Patient is on high flow O2, awake and asking simple questions. Extremity - Intact pulses distally No cellulitis present Compartment soft Dressing intact and dry Dressing - Slight increased drainage.  Mild hemophage at wound.  Sling not utilized in bed.  Pillow supporting arm.     Past Medical History:  Diagnosis Date   Acute hypoxemic respiratory failure (HCC)    Anemia in stage 4 chronic kidney disease (HCC)    Anginal pain (HCC)    Anxiety    Aortic atherosclerosis (HCC)    CHF (congestive heart failure) (HCC)    a.) TTE 09/30/2021: EF 55-60%, no RWMAs, mild MR, G1DD   Chronic pain    Chronic radicular pain of lower back    CKD (chronic  kidney disease), stage IV (HCC)    Complication of anesthesia    a.) delayed emergence following ureteroscopy 07/2023   Coronary artery calcification seen on CT scan    Costochondritis    DDD (degenerative disc disease), lumbar    Depression    Dyspnea    GERD (gastroesophageal reflux disease)    Hiatal hernia    Hip dysplasia    Hyperlipidemia    Hypertension    Insomnia    Iron deficiency    Iron deficiency anemia    Low back pain    Low vitamin B12 level    Lumbar facet joint pain    Migraines    Nose colonized with MRSA 10/03/2020   a.) preop PCR (+) 10/03/2020 prior to RIGHT TKA; b.) preop PCR (+) 08/25/2023 prior to RIGHT REVERSE SHOULDER ARTHROPLASTY; BICEPS TENODESIS   Numbness and tingling of right leg    OA (osteoarthritis)    Obesity    OSA (obstructive sleep apnea)    a.) not currently utilizing nocturnal PAP therapy; positional. PCCM feels as if symptom can be mitigated with diet/exercise/weight loss unless develops significant symptoms.   Osteoporosis    a.) recieves denosumab injections   Pelvic fracture (HCC)    Pneumonia    Pulmonary fibrosis (HCC)    Raynaud's disease without gangrene    Restless leg syndrome  a.) on pramipexole + oral Fe supplementation   Rheumatoid arthritis (HCC)    a.) Tx'd with hydroxychloroquine   Right renal mass 05/28/2023   a.) CT renal 05/28/2023:  5.7 cm mass in the medial aspect of right kidney   Seasonal allergies    Thoracic compression fracture (HCC)     Assessment/Plan:   9 Days Post-Op Procedure(s) (LRB): Right revision reverse shoulder arthroplasty (conversion to longstem and glenosphere exchange) (Right) Principal Problem:   Periprosthetic fracture around internal prosthetic right shoulder joint Active Problems:   Hypertension   Seropositive rheumatoid arthritis (HCC)   Acute respiratory failure with hypoxia (HCC)   Septic arthritis (HCC)   Acute on chronic anemia   (HFpEF) heart failure with preserved  ejection fraction (HCC)   CKD (chronic kidney disease) stage 4, GFR 15-29 ml/min (HCC)   ILD (interstitial lung disease) (HCC)   Tension pneumothorax   Cardiac arrest, cause unspecified (HCC)   Atrial fibrillation with RVR (HCC)  Estimated body mass index is 28.74 kg/m as calculated from the following:   Height as of this encounter: 5\' 3"  (1.6 m).   Weight as of this encounter: 73.6 kg.  Vital signs are stable  Labs are stable, Hgb 8.7, WBC 10.8 and stable  Continue with IV abx, cultures pending.  Appreciate ID consult and following.  Chest tube removed 12/11.  Possible extubation today.    DVT Prophylaxis - Lovenox, TED hose, and SCDs    Dedra Skeens, PA-C [facility] 11/27/2023, 7:38 AM

## 2023-11-27 NOTE — Plan of Care (Signed)
  Problem: Health Behavior/Discharge Planning: Goal: Ability to manage health-related needs will improve Outcome: Progressing   Problem: Clinical Measurements: Goal: Ability to maintain clinical measurements within normal limits will improve Outcome: Progressing Goal: Will remain free from infection Outcome: Progressing Goal: Diagnostic test results will improve Outcome: Progressing Goal: Respiratory complications will improve Outcome: Progressing Goal: Cardiovascular complication will be avoided Outcome: Progressing   Problem: Activity: Goal: Risk for activity intolerance will decrease Outcome: Progressing   Problem: Nutrition: Goal: Adequate nutrition will be maintained Outcome: Progressing   Problem: Coping: Goal: Level of anxiety will decrease Outcome: Progressing   Problem: Elimination: Goal: Will not experience complications related to bowel motility Outcome: Progressing Goal: Will not experience complications related to urinary retention Outcome: Progressing   Problem: Pain Management: Goal: General experience of comfort will improve Outcome: Progressing   Problem: Safety: Goal: Ability to remain free from injury will improve Outcome: Progressing   Problem: Skin Integrity: Goal: Risk for impaired skin integrity will decrease Outcome: Progressing   Problem: Education: Goal: Knowledge of the prescribed therapeutic regimen will improve Outcome: Progressing Goal: Understanding of activity limitations/precautions following surgery will improve Outcome: Progressing Goal: Individualized Educational Video(s) Outcome: Progressing   Problem: Activity: Goal: Ability to tolerate increased activity will improve Outcome: Progressing   Problem: Pain Management: Goal: Pain level will decrease with appropriate interventions Outcome: Progressing   Problem: Activity: Goal: Ability to tolerate increased activity will improve Outcome: Progressing   Problem:  Respiratory: Goal: Ability to maintain a clear airway and adequate ventilation will improve Outcome: Progressing   Problem: Role Relationship: Goal: Method of communication will improve Outcome: Progressing

## 2023-11-27 NOTE — Consult Note (Signed)
PHARMACY - ANTICOAGULATION CONSULT NOTE  Pharmacy Consult for IV Heparin Indication: atrial fibrillation  Patient Measurements: Height: 5\' 3"  (160 cm) Weight: 80.4 kg (177 lb 4 oz) IBW/kg (Calculated) : 52.4 Heparin Dosing Weight: 70 kg  Labs: Recent Labs    11/25/23 0152 11/26/23 0434 11/27/23 0322 11/27/23 0335  HGB 8.6* 8.7* 7.9*  --   HCT 28.3* 28.7* 26.4*  --   PLT 274 344 374  --   HEPARINUNFRC 0.39 0.42  --  0.31  CREATININE 2.74* 2.58* 2.52*  --    Estimated Creatinine Clearance: 20.9 mL/min (A) (by C-G formula based on SCr of 2.52 mg/dL (H)).  Medical History: Past Medical History:  Diagnosis Date   Acute hypoxemic respiratory failure (HCC)    Anemia in stage 4 chronic kidney disease (HCC)    Anginal pain (HCC)    Anxiety    Aortic atherosclerosis (HCC)    CHF (congestive heart failure) (HCC)    a.) TTE 09/30/2021: EF 55-60%, no RWMAs, mild MR, G1DD   Chronic pain    Chronic radicular pain of lower back    CKD (chronic kidney disease), stage IV (HCC)    Complication of anesthesia    a.) delayed emergence following ureteroscopy 07/2023   Coronary artery calcification seen on CT scan    Costochondritis    DDD (degenerative disc disease), lumbar    Depression    Dyspnea    GERD (gastroesophageal reflux disease)    Hiatal hernia    Hip dysplasia    Hyperlipidemia    Hypertension    Insomnia    Iron deficiency    Iron deficiency anemia    Low back pain    Low vitamin B12 level    Lumbar facet joint pain    Migraines    Nose colonized with MRSA 10/03/2020   a.) preop PCR (+) 10/03/2020 prior to RIGHT TKA; b.) preop PCR (+) 08/25/2023 prior to RIGHT REVERSE SHOULDER ARTHROPLASTY; BICEPS TENODESIS   Numbness and tingling of right leg    OA (osteoarthritis)    Obesity    OSA (obstructive sleep apnea)    a.) not currently utilizing nocturnal PAP therapy; positional. PCCM feels as if symptom can be mitigated with diet/exercise/weight loss unless develops  significant symptoms.   Osteoporosis    a.) recieves denosumab injections   Pelvic fracture (HCC)    Pneumonia    Pulmonary fibrosis (HCC)    Raynaud's disease without gangrene    Restless leg syndrome    a.) on pramipexole + oral Fe supplementation   Rheumatoid arthritis (HCC)    a.) Tx'd with hydroxychloroquine   Right renal mass 05/28/2023   a.) CT renal 05/28/2023:  5.7 cm mass in the medial aspect of right kidney   Seasonal allergies    Thoracic compression fracture (HCC)    Medications:  No anticoagulation prior to admission per my chart review  Assessment: 72 y/o F with a past medical history of CKD IV, HFpEF, hypertension, RA, OSA not on CPAP who came to [facility] 12/6 for right reverse shoulder arthroplasty after for suspicion of septic joint. Patient experienced cardiac arrest post-operatively on 12/7. She is intubated, sedated and on mechanical ventilation in the ICU. Hospital course now further complicated by paroxysmal Afib with RVR. Patient has been placed on amiodarone. Pharmacy consulted for heparin.  Baseline aPTT and PT-INR are pending. CBC is notable for anemia which is chronic.  12/10 20:29 HL <0.10 12/11 0544 HL 0.13, subtherapeutic 12/11 1539 HL 0.29,  subtherapeutic 12/12 0039 HL 0.34, therapeutic x 1 12/12 0920 HL 0.28, subtherapeutic 12/12 1817 HL 0.42, therapeutic x 1 12/13 0152 HL 0.39, therapeutic x 2 12/14 0434 HL 0.42, therapeutic x 3 12/15 0322 HL 0.31, therapeutic x 4  Goal of Therapy:  Heparin level 0.3-0.7 units/ml Monitor platelets by anticoagulation protocol: Yes   Plan:  --Continue heparin infusion at 1700 units/hr  --Recheck HL daily w/ AM labs while therapeutic --Daily CBC per protocol while on IV heparin  Otelia Sergeant, PharmD, [facility] 11/27/2023 5:11 AM

## 2023-11-27 NOTE — Progress Notes (Signed)
Nutrition Follow-up  DOCUMENTATION CODES:   Not applicable  INTERVENTION:  Initiate Osmolite 1.2 @ 20 ml/hr and increase by 10 ml every 4 hours to goal rate of 60 ml/hr.    80 ml free water flush every 4 hours   Tube feeding regimen provides 1728 kcal (100% of needs), 80 grams of protein, and 1181 ml of H2O.  Total free water: 1661 ml daily   NUTRITION DIAGNOSIS:   Inadequate oral intake related to inability to eat (pt sedated and ventilated) as evidenced by NPO status. Continues to be valid with intervention in place.     GOAL:   Patient will meet greater than or equal to 90% of their needs    MONITOR:   Diet advancement  REASON FOR ASSESSMENT:   Consult Enteral/tube feeding initiation and management  ASSESSMENT:   72 y/o female with h/o hiatal hernia, GERD, RLS, OSA, IDA, depression, anxiety, HLD, HTN, PAF, CKD IV, CHF, ILD, DDD, RA, right total knee arthroplasty (2021) and renal cysts who is admitted with septic joint s/p right reverse shoulder arthroplasty 12/6 complicated by PEA arrest, AKI, NSTEMI and pneumothorax 12/7.  Dobhoff pending placement,  RN attempted NG but was unsuccessful. Pt elevated heart rate, Dobhoff pending. SLP eval from 12/15 revealed; High concern for pharyngeal dysphagia given seemingly delayed swallow intiation, multiple swallows intermittent observed per bolus across trials as well as weak/delayed cough and reduced SpO2 with nectar-thick trials. SpO2 dropped to 85%. Recommending pt continue with NPO status.   NUTRITION - FOCUSED PHYSICAL EXAM:  Flowsheet Row Most Recent Value  Orbital Region No depletion  Upper Arm Region No depletion  Thoracic and Lumbar Region No depletion  Buccal Region No depletion  Temple Region No depletion  Clavicle Bone Region No depletion  Clavicle and Acromion Bone Region No depletion  Scapular Bone Region No depletion  Dorsal Hand No depletion  Patellar Region No depletion  Anterior Thigh Region No depletion   Posterior Calf Region No depletion  Edema (RD Assessment) None  Hair Reviewed  Eyes Reviewed  Mouth Reviewed  Skin Reviewed  Nails Reviewed       Diet Order:   Diet Order             Diet NPO time specified  Diet effective now                   EDUCATION NEEDS:   No education needs have been identified at this time  Skin:  Skin Assessment: Skin Integrity Issues: Skin Integrity Issues:: Incisions Incisions: clsoed rt shoulder  Last BM:  12/14  Height:   Ht Readings from Last 1 Encounters:  11/21/23 5\' 3"  (1.6 m)    Weight:   Wt Readings from Last 1 Encounters:  11/27/23 73.6 kg    Ideal Body Weight:  52 kg  BMI:  Body mass index is 28.74 kg/m.  Estimated Nutritional Needs:   Kcal:  1650-1850  Protein:  80-95 grams  Fluid:  > 1.6 L    Jamelle Haring RDN, LDN Clinical Dietitian  Pleas see Amion for contact information

## 2023-11-27 NOTE — Progress Notes (Signed)
Rounding Note    Patient Name: Tonya Cummings Date of Encounter: 11/27/2023  [facility] Cardiologist: Julien Nordmann, MD   Subjective   No acute events overnight, heart rate improving with amiodarone.  Still in atrial fibrillation.  Endorses right shoulder discomfort.  Not yet taking p.o.  Inpatient Medications    Scheduled Meds:  aspirin  325 mg Oral Daily   calcitRIOL  0.25 mcg Oral q morning   calcium-vitamin D  1 tablet Oral TID   Chlorhexidine Gluconate Cloth  6 each Topical Daily   cholecalciferol  1,000 Units Oral q morning   cyanocobalamin  1,000 mcg Oral q morning   ferrous sulfate  300 mg Oral Daily   lidocaine  1 patch Transdermal Q24H   mupirocin ointment   Topical BID   pramipexole  0.125 mg Oral q morning   And   pramipexole  0.25 mg Oral QHS   sodium chloride flush  3 mL Intravenous Q12H   sodium chloride flush  3 mL Intravenous Q12H   Continuous Infusions:  amiodarone 30 mg/hr (11/27/23 0800)   cefTRIAXone (ROCEPHIN)  IV Stopped (11/26/23 1915)   DAPTOmycin Stopped (11/26/23 1415)   heparin 1,700 Units/hr (11/27/23 0800)   PRN Meds: bisacodyl, HYDROmorphone (DILAUDID) injection, ipratropium-albuterol, metoprolol tartrate, mouth rinse, mouth rinse, mouth rinse, oxyCODONE, polyethylene glycol, senna-docusate   Vital Signs    Vitals:   11/27/23 1100 11/27/23 1200 11/27/23 1300 11/27/23 1400  BP: 130/75 127/84 126/65 (!) 128/55  Pulse: (!) 149 (!) 42 60 76  Resp: 20 (!) 22 (!) 29 (!) 24  Temp:  97.8 F (36.6 C)    TempSrc:  Oral    SpO2: 94% 98% 100% 99%  Weight:      Height:        Intake/Output Summary (Last 24 hours) at 11/27/2023 1446 Last data filed at 11/27/2023 1200 Gross per 24 hour  Intake 1902.17 ml  Output 1600 ml  Net 302.17 ml      11/27/2023    5:52 AM 11/26/2023    5:00 AM 11/23/2023    5:00 AM  Last 3 Weights  Weight (lbs) 162 lb 4.1 oz 177 lb 4 oz 177 lb 4 oz  Weight (kg) 73.6 kg 80.4 kg 80.4 kg       Telemetry    Atrial fibrillation, heart rate 94 - 100- Personally Reviewed  ECG     - Personally Reviewed  Physical Exam   GEN: Extremely frail, soft-spoken Neck: No JVD Cardiac: Irregular irregular Respiratory: Minich breath sounds at bases GI: Soft, nontender, non-distended  MS: No edema; No deformity. Neuro:  Nonfocal  Psych: Normal affect   Labs    High Sensitivity Troponin:   Recent Labs  Lab 11/19/23 1310 11/19/23 1455 11/19/23 2039 11/20/23 0252 11/20/23 2207  TROPONINIHS 208* 392* 749* 804* 293*     Chemistry Recent Labs  Lab 11/21/23 0402 11/22/23 0407 11/23/23 0544 11/24/23 0448 11/25/23 0152 11/26/23 0434 11/27/23 0322  NA 137 140   < > 137 137 143 144  K 3.7 3.9   < > 4.5 4.6 4.6 4.3  CL 104 104   < > 104 103 107 106  CO2 25 21*   < > 25 20* 22 24  GLUCOSE 107* 96   < > 146* 92 92 119*  BUN 67* 72*   < > 84* 90* 83* 83*  CREATININE 2.66* 2.96*   < > 2.61* 2.74* 2.58* 2.52*  CALCIUM 8.6* 8.7*   < >  8.5* 8.9 9.7 9.2  MG 2.0 2.0   < > 2.1 2.1 2.4  --   PROT 6.1* 6.0*  --   --   --   --   --   ALBUMIN 2.4* 2.1*  --   --   --  2.4* 2.3*  AST 51* 36  --   --   --   --   --   ALT 20 16  --   --   --   --   --   ALKPHOS 120 152*  --   --   --   --   --   BILITOT 0.8 0.9  --   --   --   --   --   GFRNONAA 19* 16*   < > 19* 18* 19* 20*  ANIONGAP 8 15   < > 8 14 14 14    < > = values in this interval not displayed.    Lipids  Recent Labs  Lab 11/22/23 0407  TRIG 138    Hematology Recent Labs  Lab 11/25/23 0152 11/26/23 0434 11/27/23 0322  WBC 11.0* 10.8* 12.6*  RBC 3.02* 3.15* 2.85*  HGB 8.6* 8.7* 7.9*  HCT 28.3* 28.7* 26.4*  MCV 93.7 91.1 92.6  MCH 28.5 27.6 27.7  MCHC 30.4 30.3 29.9*  RDW 16.6* 16.9* 16.9*  PLT 274 344 374   Thyroid No results for input(s): "TSH", "FREET4" in the last 168 hours.  BNPNo results for input(s): "BNP", "PROBNP" in the last 168 hours.  DDimer No results for input(s): "DDIMER" in the last 168 hours.    Radiology    DG Chest Port 1 View Result Date: 11/26/2023 CLINICAL DATA:  10026 with shortness of breath. EXAM: PORTABLE CHEST 1 VIEW COMPARISON:  Portable chest 11/24/2023 at 4:30 a.m. FINDINGS: 6:07 a.m. ETT/NGT interval removal. Right IJ dialysis catheter tip remains in the upper right atrium. Stable enlargement of the cardiac silhouette. Unchanged mild central vascular prominence. There is increased streaky atelectasis or infiltrate in the retrocardiac left lower lobe. There is increased patchy airspace disease in the right upper lobe with unchanged denser consolidation right lower lung field. The left upper lung field remains clear. There are small pleural effusions. Osteopenia. No new osseous findings with right shoulder replacement and overlying skin staples again shown. In all other respects no other changes are seen. IMPRESSION: 1. Increased patchy airspace disease in the right upper lobe with unchanged denser consolidation right lower lung field. 2. Increased streaky atelectasis or infiltrate in the retrocardiac left lower lobe. 3. Small pleural effusions. 4. Stable cardiomegaly and mild central vascular prominence. 5. Interval removal of ETT/NGT. Electronically Signed   By: Almira Bar M.D.   On: 11/26/2023 06:29    Cardiac Studies   TTE 11/20/2023 1. Left ventricular ejection fraction, by estimation, is 55 to 60%. The  left ventricle has normal function. The left ventricle has no regional  wall motion abnormalities. There is mild concentric left ventricular  hypertrophy. Left ventricular diastolic  parameters are indeterminate.   2. Right ventricular systolic function is normal. The right ventricular  size is normal.   3. Left atrial size was moderately dilated.   4. The mitral valve is grossly normal. Trivial mitral valve  regurgitation. No evidence of mitral stenosis.   5. The aortic valve is grossly normal. There is mild calcification of the  aortic valve. There is mild  thickening of the aortic valve. Aortic valve  regurgitation is not visualized. Aortic valve  sclerosis/calcification is  present, without any evidence of  aortic stenosis.   Patient Profile     72 y.o. female with history of HFpEF, CKD 4, hypertension presenting for shoulder surgery, postop course complicated by respiratory failure and PEA arrest requiring CPR with ROSC.  Being seen for new onset atrial fibrillation with rapid ventricular response.  Assessment & Plan    A-fib/flutter with RVR -Heart rate improved, still in A-fib. -Continue IV amiodarone, heparin drip -Start Lopressor when able to take p.o. -NOAC on discharge.  2.  Hypertension -BP normal off home BP meds. -Continue to monitor.  3.  CKD 4. -Avoid nephrotoxics. -Management as per nephrology.      Signed, Debbe Odea, MD  11/27/2023, 2:46 PM

## 2023-11-27 NOTE — Progress Notes (Signed)
PHARMACY CONSULT NOTE  Pharmacy Consult for Electrolyte Monitoring and Replacement   Recent Labs: Potassium (mmol/L)  Date Value  11/27/2023 4.3   Magnesium (mg/dL)  Date Value  91/47/8295 2.4   Calcium (mg/dL)  Date Value  62/13/0865 9.2   Albumin (g/dL)  Date Value  78/46/9629 2.3 (L)   Phosphorus (mg/dL)  Date Value  52/84/1324 5.0 (H)   Sodium (mmol/L)  Date Value  11/27/2023 144  09/12/2023 142   Assessment: 72 y.o. female with medical history significant of CKD Stage 4, HFpEF, HTN, B12 deficiency, anemia of CKD, rheumatoid arthritis, Raynaud's phenomenon, OSA not on CPAP, who was admitted on 12/6 for recurrent R shoulder revision following arthroplasty. Pt is on amio infusion.   Goal of Therapy:  K >= 4, Mg >= 2  Plan:  No replacement needed F/u with AM labs.   Ronnald Ramp, PharmD, BCPS 11/27/2023 8:08 AM

## 2023-11-27 NOTE — Progress Notes (Signed)
[facility]  ROUNDING NOTE   Subjective:   Tonya Cummings is a 72  y.o. female with past medical conditions including chronic kidney disease stage IV, proteinuria, hypertension, anemia of chronic kidney disease, RA, OSA and secondary hyperparathyroidism. Patient presented to the hospital for planned right shoulder arthroplasty which was later complicated by PEA cardiac arrest.  Patient is known to our practice and is followed outpatient by Dr. Cherylann Ratel.  Patient is seen and evaluated at bedside in ICU.    UOP  Creatinine 2.52 (2.74)  Amiodarone gtt started for new onset atrial fibrillation.   Patient able to answer questions and voices no complaints or concerns.   Objective:  Vital signs in last 24 hours:  Temp:  [97.9 F (36.6 C)-98.7 F (37.1 C)] 98.7 F (37.1 C) (12/15 0800) Pulse Rate:  [49-159] 149 (12/15 1100) Resp:  [13-27] 20 (12/15 1100) BP: (106-174)/(48-118) 130/75 (12/15 1100) SpO2:  [90 %-100 %] 94 % (12/15 1100) Weight:  [73.6 kg] 73.6 kg (12/15 0552)  Weight change: -6.8 kg Filed Weights   11/23/23 0500 11/26/23 0500 11/27/23 0552  Weight: 80.4 kg 80.4 kg 73.6 kg    Intake/Output: I/O last 3 completed shifts: In: 1834.9 [P.O.:180; I.V.:1354.9; IV Piggyback:299.9] Out: 3350 [Urine:3350]   Intake/Output this shift:  Total I/O In: 67.3 [I.V.:67.3] Out: 0   Physical Exam: General: Ill appearing, laying in bed  Head: Normocephalic, atraumatic. Moist oral mucosal membranes  Eyes: Anicteric  Lungs:  Rhonchus, 2L [place] O2  Heart: irregular  Abdomen:  Soft, nontender  Extremities:  No peripheral edema.  Neurologic: Nonfocal, moving all four extremities  Skin: No lesions       Basic Metabolic Panel: Recent Labs  Lab 11/22/23 0407 11/23/23 0544 11/24/23 0448 11/25/23 0152 11/26/23 0434 11/27/23 0322  NA 140 136 137 137 143 144  K 3.9 3.9 4.5 4.6 4.6 4.3  CL 104 101 104 103 107 106  CO2 21* 22 25 20* 22 24  GLUCOSE 96 155*  146* 92 92 119*  BUN 72* 76* 84* 90* 83* 83*  CREATININE 2.96* 2.88* 2.61* 2.74* 2.58* 2.52*  CALCIUM 8.7* 8.6* 8.5* 8.9 9.7 9.2  MG 2.0 2.0 2.1 2.1 2.4  --   PHOS 5.2* 4.1 3.4 5.0* 4.8* 5.0*    Liver Function Tests: Recent Labs  Lab 11/21/23 0402 11/22/23 0407 11/26/23 0434 11/27/23 0322  AST 51* 36  --   --   ALT 20 16  --   --   ALKPHOS 120 152*  --   --   BILITOT 0.8 0.9  --   --   PROT 6.1* 6.0*  --   --   ALBUMIN 2.4* 2.1* 2.4* 2.3*   No results for input(s): "LIPASE", "AMYLASE" in the last 168 hours. No results for input(s): "AMMONIA" in the last 168 hours.  CBC: Recent Labs  Lab 11/23/23 0544 11/24/23 0448 11/25/23 0152 11/26/23 0434 11/27/23 0322  WBC 8.8 8.8 11.0* 10.8* 12.6*  HGB 7.2* 7.5* 8.6* 8.7* 7.9*  HCT 22.8* 23.9* 28.3* 28.7* 26.4*  MCV 91.2 89.2 93.7 91.1 92.6  PLT 227 234 274 344 374    Cardiac Enzymes: Recent Labs  Lab 11/23/23 0544  CKTOTAL 35*    BNP: Invalid input(s): "POCBNP"  CBG: Recent Labs  Lab 11/26/23 1953 11/26/23 2321 11/27/23 0320 11/27/23 0352 11/27/23 0740  GLUCAP 114* 114* 109* 119* 100*    Microbiology: Results for orders placed or performed during the hospital encounter of 11/18/23  Surgical pcr screen     Status: Abnormal   Collection Time: 11/18/23 12:11 PM   Specimen: Nasal Mucosa; Nasal Swab  Result Value Ref Range Status   MRSA, PCR NEGATIVE NEGATIVE Final   Staphylococcus aureus POSITIVE (A) NEGATIVE Final    Comment: (NOTE) The Xpert SA Assay (FDA approved for NASAL specimens in patients 83 years of age and older), is one component of a comprehensive surveillance program. It is not intended to diagnose infection nor to guide or monitor treatment. Performed at [facility], [address] [place], [place] [address]   Aerobic/Anaerobic Culture w Gram Stain (surgical/deep wound)     Status: None (Preliminary result)   Collection Time: 11/18/23  2:29 PM   Specimen: Path Tissue  Result  Value Ref Range Status   Specimen Description   Final    TISSUE Performed at [facility], [address] [place], [place] [address]    Special Requests RT HUMERUS HOLD 21DAYS  Final   Gram Stain   Final    RARE WBC PRESENT,BOTH PMN AND MONONUCLEAR NO ORGANISMS SEEN    Culture   Final    NO GROWTH 8 DAYS CONTINUING TO HOLD Performed at [facility], [address], [place], [place] [address]    Report Status PENDING  Incomplete  Aerobic/Anaerobic Culture w Gram Stain (surgical/deep wound)     Status: None (Preliminary result)   Collection Time: 11/18/23  2:43 PM   Specimen: Path Tissue  Result Value Ref Range Status   Specimen Description   Final    TISSUE Performed at [facility], [address] [place], [place] [address]    Special Requests TUBEROSITY,HOLD 21DAYS  Final   Gram Stain   Final    FEW WBC PRESENT,BOTH PMN AND MONONUCLEAR NO ORGANISMS SEEN    Culture   Final    NO GROWTH 8 DAYS CONTINUING TO HOLD Performed at [facility], [address] [place], [place] [address]    Report Status PENDING  Incomplete  Aerobic/Anaerobic Culture w Gram Stain (surgical/deep wound)     Status: None (Preliminary result)   Collection Time: 11/18/23  2:52 PM   Specimen: Path Tissue  Result Value Ref Range Status   Specimen Description   Final    TISSUE Performed at [facility], [address] [place], [place] [address]    Special Requests RT HUMERAL TRAY HOLD 21 DAYS  Final   Gram Stain   Final    RARE WBC PRESENT, PREDOMINANTLY MONONUCLEAR NO ORGANISMS SEEN    Culture   Final    NO GROWTH 8 DAYS CONTINUING TO HOLD Performed at [facility], [address], [place], [place] [address]    Report Status PENDING  Incomplete  Aerobic/Anaerobic Culture w Gram Stain (surgical/deep wound)     Status: None (Preliminary result)   Collection Time: 11/18/23  2:58 PM   Specimen: Path Tissue  Result Value Ref Range Status   Specimen  Description   Final    TISSUE Performed at [facility], [address] [place], [place] [address]    Special Requests GLENOSPHERE,HOLD 21 DAYS  Final   Gram Stain   Final    RARE WBC PRESENT,BOTH PMN AND MONONUCLEAR NO ORGANISMS SEEN    Culture   Final    NO GROWTH 8 DAYS CONTINUING TO HOLD Performed at [facility], [address], [place], [place] [address]    Report  Status PENDING  Incomplete  Aerobic/Anaerobic Culture w Gram Stain (surgical/deep wound)     Status: None (Preliminary result)   Collection Time: 11/18/23  3:06 PM   Specimen: Path Tissue  Result Value Ref Range Status   Specimen Description   Final    TISSUE Performed at [facility], [address] [place], [place] [address]    Special Requests RT HUMEREUS CANAL HOLD 21DAYS  Final   Gram Stain   Final    FEW WBC PRESENT, PREDOMINANTLY MONONUCLEAR NO ORGANISMS SEEN    Culture   Final    NO GROWTH 8 DAYS CONTINUING TO HOLD Performed at [facility], [address] [place], [place] [address]    Report Status PENDING  Incomplete  Culture, blood (Routine X 2) w Reflex to ID Panel     Status: None   Collection Time: 11/18/23  7:22 PM   Specimen: BLOOD  Result Value Ref Range Status   Specimen Description BLOOD BLOOD LEFT HAND  Final   Special Requests   Final    BOTTLES DRAWN AEROBIC AND ANAEROBIC Blood Culture adequate volume   Culture   Final    NO GROWTH 5 DAYS Performed at [facility], [address], [place], [place] [address]    Report Status 11/23/2023 FINAL  Final  Culture, blood (Routine X 2) w Reflex to ID Panel     Status: None   Collection Time: 11/18/23  7:28 PM   Specimen: BLOOD  Result Value Ref Range Status   Specimen Description BLOOD BLOOD LEFT ARM  Final   Special Requests   Final    BOTTLES DRAWN AEROBIC AND ANAEROBIC Blood Culture adequate volume   Culture   Final    NO GROWTH 5 DAYS Performed at [facility], [address] [place], [place] [address]    Report Status 11/23/2023 FINAL  Final  MRSA Next Gen by PCR, Nasal     Status: None   Collection Time: 11/19/23  1:57 PM   Specimen: Nasal Mucosa; Nasal Swab  Result Value Ref Range Status   MRSA by PCR Next Gen NOT DETECTED NOT DETECTED Final    Comment: (NOTE) The GeneXpert MRSA Assay (FDA approved for NASAL specimens only), is one component of a comprehensive MRSA colonization surveillance program. It is not intended to diagnose MRSA infection nor to guide or monitor treatment for MRSA infections. Test performance is not FDA approved in patients less than 59 years old. Performed at [facility], [address] [place], [place] [address]   Culture, Respiratory w Gram Stain     Status: None   Collection Time: 11/21/23  3:18 PM   Specimen: Tracheal Aspirate; Respiratory  Result Value Ref Range Status   Specimen Description   Final    TRACHEAL ASPIRATE Performed at [facility], [address] [place], [place] [address]    Special Requests   Final    NONE Performed at [facility], [address] [place], [place] [address]    Gram Stain   Final    ABUNDANT WBC PRESENT, PREDOMINANTLY PMN RARE GRAM POSITIVE COCCI IN PAIRS RARE GRAM NEGATIVE RODS    Culture   Final    FEW Normal respiratory flora-no Staph aureus or Pseudomonas seen Performed at [facility], [address] [place], [place] [address]    Report Status 11/23/2023 FINAL  Final    Coagulation Studies: No results for input(s): "LABPROT", "INR" in  the last 72 hours.   Urinalysis: No results for input(s): "COLORURINE", "LABSPEC", "PHURINE", "GLUCOSEU", "HGBUR", "BILIRUBINUR", "KETONESUR", "PROTEINUR", "UROBILINOGEN", "NITRITE", "LEUKOCYTESUR" in the last 72 hours.  Invalid input(s): "APPERANCEUR"    Imaging: DG Chest Port 1 View Result Date: 11/26/2023 CLINICAL DATA:  10026 with shortness of breath. EXAM: PORTABLE CHEST 1 VIEW COMPARISON:   Portable chest 11/24/2023 at 4:30 a.m. FINDINGS: 6:07 a.m. ETT/NGT interval removal. Right IJ dialysis catheter tip remains in the upper right atrium. Stable enlargement of the cardiac silhouette. Unchanged mild central vascular prominence. There is increased streaky atelectasis or infiltrate in the retrocardiac left lower lobe. There is increased patchy airspace disease in the right upper lobe with unchanged denser consolidation right lower lung field. The left upper lung field remains clear. There are small pleural effusions. Osteopenia. No new osseous findings with right shoulder replacement and overlying skin staples again shown. In all other respects no other changes are seen. IMPRESSION: 1. Increased patchy airspace disease in the right upper lobe with unchanged denser consolidation right lower lung field. 2. Increased streaky atelectasis or infiltrate in the retrocardiac left lower lobe. 3. Small pleural effusions. 4. Stable cardiomegaly and mild central vascular prominence. 5. Interval removal of ETT/NGT. Electronically Signed   By: Almira Bar M.D.   On: 11/26/2023 06:29     Medications:    amiodarone 30 mg/hr (11/27/23 0800)   cefTRIAXone (ROCEPHIN)  IV Stopped (11/26/23 1915)   DAPTOmycin Stopped (11/26/23 1415)   heparin 1,700 Units/hr (11/27/23 0800)    aspirin  325 mg Oral Daily   calcitRIOL  0.25 mcg Oral q morning   calcium-vitamin D  1 tablet Oral TID   Chlorhexidine Gluconate Cloth  6 each Topical Daily   cholecalciferol  1,000 Units Oral q morning   cyanocobalamin  1,000 mcg Oral q morning   ferrous sulfate  300 mg Oral Daily   lidocaine  1 patch Transdermal Q24H   mupirocin ointment   Topical BID   pramipexole  0.125 mg Oral q morning   And   pramipexole  0.25 mg Oral QHS   sodium chloride flush  3 mL Intravenous Q12H   sodium chloride flush  3 mL Intravenous Q12H   bisacodyl, HYDROmorphone (DILAUDID) injection, ipratropium-albuterol, metoprolol tartrate, mouth rinse,  mouth rinse, mouth rinse, oxyCODONE, polyethylene glycol, senna-docusate  Assessment/ Plan:  [name] is a 72 y.o.  female with past medical conditions including chronic kidney disease stage IV, proteinuria, hypertension, anemia of chronic kidney disease, RA, OSA and secondary hyperparathyroidism. Patient presented to the hospital for planned right shoulder arthroplasty which was later complicated by PEA cardiac arrest.  She is currently admitted for Periprosthetic fracture around internal prosthetic right shoulder joint [M97.31XA] Septic arthritis (HCC) [M00.9]   Acute Kidney Injury on chronic kidney disease stage 4 with baseline creatinine 2.08 on 10/04/2023.  Acute kidney injury secondary to ATN from hypoperfusion.  Chronic kidney disease secondary to hypertension and rheumatoid arthritis with prior NSAID use.  No IV contrast exposure.    - No acute indication for dialysis.Kidney function continues to improve. Nonoliguric urine output.    Lab Results  Component Value Date   CREATININE 2.52 (H) 11/27/2023   CREATININE 2.58 (H) 11/26/2023   CREATININE 2.74 (H) 11/25/2023    Intake/Output Summary (Last 24 hours) at 11/27/2023 1119 Last data filed at 11/27/2023 0800 Gross per 24 hour  Intake 1902.17 ml  Output 2100 ml  Net -197.83 ml   2. Anemia of chronic kidney disease  with acute blood loss Lab Results  Component Value Date   HGB 7.9 (L) 11/27/2023  Patient has received blood transfusions during this admission.   3. Secondary Hyperparathyroidism: with outpatient labs: PTH 45, phosphorus 4.7, calcium 9.4 on 11/07/23.   Lab Results  Component Value Date   CALCIUM 9.2 11/27/2023   PHOS 5.0 (H) 11/27/2023    Will continue to monitor bone minerals during this admission.  Continue calcitriol.     LOS: 9 Jeidi Gilles 12/15/202411:19 AM

## 2023-11-27 NOTE — Progress Notes (Signed)
Progress Note   Patient: Tonya Cummings NGE:[id number] DOB: 23-Feb-1951 DOA: 11/18/2023     9 DOS: the patient was seen and examined on 11/27/2023   Brief hospital course:  72 year old female patient with a past medical history of CKD stage IV, heart failure with preserved EF, hypertension, rheumatoid arthritis, OSA not on CPAP presents to [facility] on 12/06 for right reverse shoulder arthroplasty after for suspicion of septic joint. She initially underwent right shoulder arthroplasty on 08/30/2023 after which she developed a.  Periprosthetic fracture status post conversion to a longstem humeral component with ORIF on 10/03/2023.  No evidence of infection at the time. Follow-up lab work showed elevated inflammatory markers including ESR suspicious for an infectious process.  Therefore she presents back on 12/06 for shoulder arthroplasty explant of glenoid and humeral component open treatment of right humerus fracture with intramedullary device and insertion of antibiotic delivery system.  Small area of purulence was noted.  Cultures were sent.  Started on daptomycin and ceftriaxone.   She did well immediately postop however on 12/07 she was getting up to the bedside commode she went into respiratory distress with O2 sat dropping becoming apneic and went into PEA arrest.  CPR for roughly 5 to 6-minute prior to ROSC epi x 2.  Not shockable rhythm.  Patient intubated and transferred to the ICU for further care.  Postintubation chest x-ray with right pneumothorax status post chest tube placement 12/07.  HD catheter placed 12/07 anticipating need for dialysis given worsening kidney function.   During ICU stay patient had circulatory shock requiring pressors.  She has elevated troponin in the setting of cardiac arrest, new onset A-fib.  Echocardiogram 12/08 with normal LVEF 55 to 60%.  RV systolic function is normal and size is normal. Ultrasound venous lower extremity 12/08 negative  for DVT. Patient had pneumothorax 12/7 for which a right chest tube was placed which is removed 12/11.  Her kidney function remained stable and nephrologist followed advised no acute indication for dialysis. She is successfully extubated 12/13 and transferred to [facility] service.   Assessment and Plan: PEA arrest NSTEMI New onset A-fib: Circulatory shock, off pressors now. She is on heparin gtt. Echo showed EF 55-60%.  She is continued on amiodarone gtt for rapid A-fib per cardiology recommendations.  Heart rate 110-150. Cardiology follow-up appreciated, start Lopressor once able to take p.o., she will need anticoagulant prior to discharge.  Acute hypoxic respiratory failure Right pneumothorax Status post right-sided chest tube placed 12/7, removed 12/11. Possible aspiration pneumonia Patient is currently on 2 L supplemental oxygen postextubation 12/13. Continue to monitor and wean her oxygen. Continue ceftriaxone and daptomycin therapy. Continue aggressive pulmonary toilet, DuoNebs as needed. Encourage out of bed to chair, incentive spirometry.  Acute toxic metabolic encephalopathy In the setting of PEA arrest requiring airway protection, on sedation. Patient is more alert and awake today morning.  She does have hoarse voice but able to answer me appropriately. Continue neurochecks.  Dysphagia: Hoarseness s/p extubation. SLP evaluation and follow-up-advised NG tube for feeds and meds. RN unable to place NG as she cannot swallow, patient refused tube placement and wants to try tomorrow. She is getting meds with pure.  Acute on chronic kidney injury stage 4: Acute kidney injury in the setting of hypoperfusion. Avoid nephrotoxic drugs.  Continue Rocaltrol, vitamin D3 per nephrology. No acute indication for dialysis.  Right reverse shoulder arthroplasty 9/17, status post revision surgery on 1021, 11/18/2023- Hardware explanted 12/6.  Currently on daptomycin and  ceftriaxone per ID  recommendations.  Tissue cultures so far negative.  Anemia of chronic disease: Hemoglobin stable.  No active bleeding. Continue iron, B12 supplements.  Obesity with BMI 31.40 Contributing to her current condition. OSA not on CPAP. Diet, exercise and weight reduction advised. Check A1c. Continue hypoglycemia protocol.  Debility and deconditioning: Out of bed to chair, encourage incentive spirometry. PT OT evaluation.    Nursing supportive care. Fall, aspiration precautions. DVT prophylaxis heparin drip   Code Status: Full Code  Subjective: Patient is seen and examined today morning. She is weak, did not get out of bed, currently on 2L supplemental O2.  Speech therapist at bedside during swallow eval, unable to tolerate thickened liquids.  Discussed with RN the need for NG, she agreed.  Physical Exam: Vitals:   11/27/23 1200 11/27/23 1300 11/27/23 1400 11/27/23 1500  BP: 127/84 126/65 (!) 128/55 (!) 147/66  Pulse: (!) 42 60 76 91  Resp: (!) 22 (!) 29 (!) 24 (!) 26  Temp: 97.8 F (36.6 C)     TempSrc: Oral     SpO2: 98% 100% 99% 99%  Weight:      Height:        General - Elderly obese Caucasian female, tachycardia, mild respiratory distress, hoarse voice HEENT - PERRLA, EOMI, atraumatic head, non tender sinuses. Lung - distant breath sounds, basal rales, rhonchi, no wheezes. Heart - S1, S2 heard, no murmurs, rubs, 1+ pedal edema. Abdomen - Soft, non tender obese, bowel sounds good Neuro - Alert, awake and oriented, moving extremities. Skin - Warm and dry.  Data Reviewed:      Latest Ref Rng & Units 11/27/2023    3:22 AM 11/26/2023    4:34 AM 11/25/2023    1:52 AM  CBC  WBC 4.0 - 10.5 K/uL 12.6  10.8  11.0   Hemoglobin 12.0 - 15.0 g/dL 7.9  8.7  8.6   Hematocrit 36.0 - 46.0 % 26.4  28.7  28.3   Platelets 150 - 400 K/uL 374  344  274       Latest Ref Rng & Units 11/27/2023    3:22 AM 11/26/2023    4:34 AM 11/25/2023    1:52 AM  BMP  Glucose 70 - 99 mg/dL  161  92  92   BUN 8 - 23 mg/dL 83  83  90   Creatinine 0.44 - 1.00 mg/dL 0.96  0.45  4.09   Sodium 135 - 145 mmol/L 144  143  137   Potassium 3.5 - 5.1 mmol/L 4.3  4.6  4.6   Chloride 98 - 111 mmol/L 106  107  103   CO2 22 - 32 mmol/L 24  22  20    Calcium 8.9 - 10.3 mg/dL 9.2  9.7  8.9    DG Chest Port 1 View Result Date: 11/26/2023 CLINICAL DATA:  10026 with shortness of breath. EXAM: PORTABLE CHEST 1 VIEW COMPARISON:  Portable chest 11/24/2023 at 4:30 a.m. FINDINGS: 6:07 a.m. ETT/NGT interval removal. Right IJ dialysis catheter tip remains in the upper right atrium. Stable enlargement of the cardiac silhouette. Unchanged mild central vascular prominence. There is increased streaky atelectasis or infiltrate in the retrocardiac left lower lobe. There is increased patchy airspace disease in the right upper lobe with unchanged denser consolidation right lower lung field. The left upper lung field remains clear. There are small pleural effusions. Osteopenia. No new osseous findings with right shoulder replacement and overlying skin staples again shown. In all other respects  no other changes are seen. IMPRESSION: 1. Increased patchy airspace disease in the right upper lobe with unchanged denser consolidation right lower lung field. 2. Increased streaky atelectasis or infiltrate in the retrocardiac left lower lobe. 3. Small pleural effusions. 4. Stable cardiomegaly and mild central vascular prominence. 5. Interval removal of ETT/NGT. Electronically Signed   By: Almira Bar M.D.   On: 11/26/2023 06:29     Family Communication: Discussed with husband over phone, he understand and agree. All questions answereed.  Disposition: Status is: Inpatient Remains inpatient appropriate because: A-fib with RVR, IV antibiotics, IV heparin, cardiology follow-up  Planned Discharge Destination: Home with Home Health     MDM level 3 - Patient is sick with rapid A-fib, s/p extubation yesterday.  She is  transferred out of ICU and she will need close hemodynamic, neurologic and telemetry monitoring.  She is at high risk for sudden clinical deterioration.  Author: Marcelino Duster, MD 11/27/2023 3:16 PM Secure chat 7am to 7pm For on call review [email or web address].

## 2023-11-27 NOTE — Plan of Care (Signed)
  Problem: Health Behavior/Discharge Planning: Goal: Ability to manage health-related needs will improve Outcome: Progressing   Problem: Clinical Measurements: Goal: Respiratory complications will improve Outcome: Progressing   Problem: Coping: Goal: Level of anxiety will decrease Outcome: Progressing   

## 2023-11-27 NOTE — Progress Notes (Signed)
Speech Language Pathology Treatment: Dysphagia  Patient Details Name: Tonya Cummings MRN: [id number] DOB: 16-May-1951 Today's Date: 11/27/2023 Time: 7829-5621 SLP Time Calculation (min) (ACUTE ONLY): 20 min  Assessment / Plan / Recommendation Clinical Impression  Pt seen for PO trials. Pt alert, aphonic. On 2L/min O2 via [place]. Full support needed for repositioning with pt often with L lateral lean. Debility evident. Pt given trials of applesauce and nectar-thick liquids. Pt with delayed A-P transit and intermittent oral holding across trials. Anterior loss of liquids noted x1. High concern for pharyngeal dysphagia given seemingly delayed swallow intiation, multiple swallows intermittent observed per bolus across trials as well as weak/delayed cough and reduced SpO2 with nectar-thick trials. SpO2 dropped to 85%. Pt able to recover with cued nasal inhalations and cessation of further PO trials. At this time, a safe oral diet cannot be recommended. Recommend NPO x critical meds crushed in applesauce/therapeutic trials of applesauce. Recommend consideration for short term alternate means of nutrition given debility, multiple medical comorbidities, and overall clinical presentation. SLP to continue to f/u per POC.   HPI HPI: Pt is a 72 y/o female with h/o Hiatal hernia, GERD, RLS, OSA, IDA, depression, Raynaud's disease, obesity, restless leg, anxiety, HLD, HTN, PAF, CKD IV, CHF, ILD, DDD, RA, right total knee arthroplasty (2021) and renal cysts who is admitted with septic joint s/p right reverse shoulder arthroplasty 12/6 complicated by PEA arrest, AKI, NSTEMI and pneumothorax 12/7. She was intubated, then extubated on 11/24/23.   Intraoperatively, it was noted that patient's joint appeared infected.    CXR: 11/26/23, "1. Increased patchy airspace disease in the right upper lobe with  unchanged denser consolidation right lower lung field.  2. Increased streaky atelectasis or infiltrate in the retrocardiac   left lower lobe.  3. Small pleural effusions.  4. Stable cardiomegaly and mild central vascular prominence.  5. Interval removal of ETT/NGT."      SLP Plan  Continue with current plan of care      Recommendations for follow up therapy are one component of a multi-disciplinary discharge planning process, led by the attending physician.  Recommendations may be updated based on patient status, additional functional criteria and insurance authorization.    Recommendations  Diet recommendations:  (NPO; therapeutic trials of applesauce with SLP/nursing) Medication Administration: Via alternative means (vs critical meds crushed in applesauce) Supervision:  (upright and assistance with therapeutic trials; single bites)                 (Dietician) Oral care BID;Oral care before and after PO;Staff/trained caregiver to provide oral care     Dysphagia, unspecified (R13.10)     Continue with current plan of care    Clyde Canterbury, M.S., CCC-SLP Speech-Language Pathologist [facility] [phone] Arnette Felts)  Woodroe Chen  11/27/2023, 10:05 AM

## 2023-11-27 NOTE — Progress Notes (Signed)
Attempt to place NG tube but unsuccessful, pt not able to tolerate it, pts  HR increased to 150. Pt coughing. Pt requesting to try later.  13:00-pt refusing dobhoff. Pt requesting if we can try again tomorrow. Dr. Clide Dales aware.

## 2023-11-28 ENCOUNTER — Inpatient Hospital Stay: Payer: Medicare HMO

## 2023-11-28 DIAGNOSIS — M9731XD Periprosthetic fracture around internal prosthetic right shoulder joint, subsequent encounter: Secondary | ICD-10-CM | POA: Diagnosis not present

## 2023-11-28 DIAGNOSIS — I4891 Unspecified atrial fibrillation: Secondary | ICD-10-CM | POA: Diagnosis not present

## 2023-11-28 LAB — RENAL FUNCTION PANEL
Albumin: 2.7 g/dL — ABNORMAL LOW (ref 3.5–5.0)
Anion gap: 13 (ref 5–15)
BUN: 78 mg/dL — ABNORMAL HIGH (ref 8–23)
CO2: 25 mmol/L (ref 22–32)
Calcium: 9.7 mg/dL (ref 8.9–10.3)
Chloride: 107 mmol/L (ref 98–111)
Creatinine, Ser: 2.52 mg/dL — ABNORMAL HIGH (ref 0.44–1.00)
GFR, Estimated: 20 mL/min — ABNORMAL LOW (ref >60)
Glucose, Bld: 111 mg/dL — ABNORMAL HIGH (ref 70–99)
Phosphorus: 4.7 mg/dL — ABNORMAL HIGH (ref 2.5–4.6)
Potassium: 3.9 mmol/L (ref 3.5–5.1)
Sodium: 145 mmol/L (ref 135–145)

## 2023-11-28 LAB — CBC
HCT: 26.8 % — ABNORMAL LOW (ref 36.0–46.0)
Hemoglobin: 8.3 g/dL — ABNORMAL LOW (ref 12.0–15.0)
MCH: 27.9 pg (ref 26.0–34.0)
MCHC: 31 g/dL (ref 30.0–36.0)
MCV: 89.9 fL (ref 80.0–100.0)
Platelets: 413 10*3/uL — ABNORMAL HIGH (ref 150–400)
RBC: 2.98 MIL/uL — ABNORMAL LOW (ref 3.87–5.11)
RDW: 17.2 % — ABNORMAL HIGH (ref 11.5–15.5)
WBC: 14.1 10*3/uL — ABNORMAL HIGH (ref 4.0–10.5)
nRBC: 0.5 % — ABNORMAL HIGH (ref 0.0–0.2)

## 2023-11-28 LAB — GLUCOSE, CAPILLARY
Glucose-Capillary: 103 mg/dL — ABNORMAL HIGH (ref 70–99)
Glucose-Capillary: 109 mg/dL — ABNORMAL HIGH (ref 70–99)
Glucose-Capillary: 110 mg/dL — ABNORMAL HIGH (ref 70–99)
Glucose-Capillary: 113 mg/dL — ABNORMAL HIGH (ref 70–99)
Glucose-Capillary: 126 mg/dL — ABNORMAL HIGH (ref 70–99)
Glucose-Capillary: 98 mg/dL (ref 70–99)

## 2023-11-28 LAB — MAGNESIUM: Magnesium: 2.5 mg/dL — ABNORMAL HIGH (ref 1.7–2.4)

## 2023-11-28 LAB — C-REACTIVE PROTEIN: CRP: 16.8 mg/dL — ABNORMAL HIGH (ref <1.0)

## 2023-11-28 LAB — SEDIMENTATION RATE: Sed Rate: 121 mm/h — ABNORMAL HIGH (ref 0–30)

## 2023-11-28 LAB — HEPARIN LEVEL (UNFRACTIONATED): Heparin Unfractionated: 0.34 [IU]/mL (ref 0.30–0.70)

## 2023-11-28 MED ORDER — SODIUM CHLORIDE 0.9 % IV SOLN
INTRAVENOUS | Status: DC
Start: 2023-11-28 — End: 2023-11-29

## 2023-11-28 MED ORDER — OLANZAPINE 10 MG IM SOLR
5.0000 mg | Freq: Once | INTRAMUSCULAR | Status: AC | PRN
  Administered 2023-11-29: 5 mg via INTRAMUSCULAR
  Filled 2023-11-28: qty 10

## 2023-11-28 MED ORDER — METOPROLOL TARTRATE 25 MG PO TABS
25.0000 mg | ORAL_TABLET | Freq: Two times a day (BID) | ORAL | Status: DC
  Administered 2023-11-29 – 2023-12-01 (×2): 25 mg via ORAL
  Filled 2023-11-28 (×5): qty 1

## 2023-11-28 MED ORDER — MORPHINE SULFATE (PF) 2 MG/ML IV SOLN
1.0000 mg | INTRAVENOUS | Status: DC | PRN
  Administered 2023-11-28 – 2023-12-01 (×7): 1 mg via INTRAVENOUS
  Filled 2023-11-28 (×7): qty 1

## 2023-11-28 MED ORDER — POTASSIUM CHLORIDE 20 MEQ PO PACK
20.0000 meq | PACK | Freq: Once | ORAL | Status: AC
  Administered 2023-11-28: 20 meq via ORAL
  Filled 2023-11-28: qty 1

## 2023-11-28 MED ORDER — POTASSIUM CHLORIDE CRYS ER 20 MEQ PO TBCR: 20.0000 meq | EXTENDED_RELEASE_TABLET | Freq: Once | ORAL | Status: DC

## 2023-11-28 MED ORDER — METOPROLOL TARTRATE 5 MG/5ML IV SOLN
5.0000 mg | Freq: Once | INTRAVENOUS | Status: AC
  Administered 2023-11-28: 5 mg via INTRAVENOUS
  Filled 2023-11-28: qty 5

## 2023-11-28 MED ORDER — HALOPERIDOL LACTATE 5 MG/ML IJ SOLN
2.0000 mg | Freq: Once | INTRAMUSCULAR | Status: AC
  Administered 2023-11-28: 2 mg via INTRAVENOUS
  Filled 2023-11-28: qty 1

## 2023-11-28 NOTE — Plan of Care (Signed)
  Problem: Clinical Measurements: Goal: Respiratory complications will improve Outcome: Progressing Goal: Cardiovascular complication will be avoided Outcome: Progressing   Problem: Elimination: Goal: Will not experience complications related to urinary retention Outcome: Progressing   Problem: Pain Management: Goal: General experience of comfort will improve Outcome: Progressing   Problem: Safety: Goal: Ability to remain free from injury will improve Outcome: Progressing   Problem: Nutrition: Goal: Adequate nutrition will be maintained Outcome: Not Progressing

## 2023-11-28 NOTE — Progress Notes (Addendum)
Patient refusing NGT placement. MD notified.   1710- patient appearing to be intermittently confused w/ possible hallucinations. Patient did not remember husband visiting this am and husband reports patient talking about a "dog on a chair". MD Fran Lowes notified.

## 2023-11-28 NOTE — Progress Notes (Signed)
Rounding Note    Patient Name: Tonya Cummings Date of Encounter: 11/28/2023  [facility] Cardiologist: Julien Nordmann, MD   Subjective   Patient is overall OK. No chest pain reported. She is on supplemental O2. Still in afib with rates 100-120s.   Inpatient Medications    Scheduled Meds:  aspirin  325 mg Oral Daily   calcitRIOL  0.25 mcg Oral q morning   calcium-vitamin D  1 tablet Oral TID   Chlorhexidine Gluconate Cloth  6 each Topical Daily   cholecalciferol  1,000 Units Oral q morning   cyanocobalamin  1,000 mcg Oral q morning   ferrous sulfate  300 mg Oral Daily   lidocaine  1 patch Transdermal Q24H   mupirocin ointment   Topical BID   pramipexole  0.125 mg Oral q morning   And   pramipexole  0.25 mg Oral QHS   sodium chloride flush  3 mL Intravenous Q12H   sodium chloride flush  3 mL Intravenous Q12H   Continuous Infusions:  amiodarone 30 mg/hr (11/28/23 1511)   cefTRIAXone (ROCEPHIN)  IV Stopped (11/27/23 1727)   DAPTOmycin 200 mL/hr at 11/28/23 1400   heparin 1,700 Units/hr (11/28/23 1400)   PRN Meds: bisacodyl, ipratropium-albuterol, metoprolol tartrate, morphine injection, mouth rinse, mouth rinse, mouth rinse, polyethylene glycol, senna-docusate   Vital Signs    Vitals:   11/28/23 1200 11/28/23 1300 11/28/23 1400 11/28/23 1500  BP: (!) 139/58  (!) 142/75   Pulse: (!) 111   81  Resp: 18 20 (!) 26 (!) 27  Temp: 97.8 F (36.6 C)     TempSrc: Axillary     SpO2: 97% 91% 96% 96%  Weight:      Height:        Intake/Output Summary (Last 24 hours) at 11/28/2023 1534 Last data filed at 11/28/2023 1400 Gross per 24 hour  Intake 873.34 ml  Output 1260 ml  Net -386.66 ml      11/28/2023    4:00 AM 11/27/2023    5:52 AM 11/26/2023    5:00 AM  Last 3 Weights  Weight (lbs) 161 lb 2.5 oz 162 lb 4.1 oz 177 lb 4 oz  Weight (kg) 73.1 kg 73.6 kg 80.4 kg      Telemetry    Afib HR 100-120 - Personally Reviewed  ECG    No new -  Personally Reviewed  Physical Exam   GEN: No acute distress.   Neck: No JVD Cardiac: Irreg IRreg, no murmurs, rubs, or gallops.  Respiratory: wheezing GI: Soft, nontender, non-distended  MS: No edema; No deformity. Neuro:  Nonfocal  Psych: Normal affect   Labs    High Sensitivity Troponin:   Recent Labs  Lab 11/19/23 1310 11/19/23 1455 11/19/23 2039 11/20/23 0252 11/20/23 2207  TROPONINIHS 208* 392* 749* 804* 293*     Chemistry Recent Labs  Lab 11/22/23 0407 11/23/23 0544 11/25/23 0152 11/26/23 0434 11/27/23 0322 11/28/23 0430  NA 140   < > 137 143 144 145  K 3.9   < > 4.6 4.6 4.3 3.9  CL 104   < > 103 107 106 107  CO2 21*   < > 20* 22 24 25   GLUCOSE 96   < > 92 92 119* 111*  BUN 72*   < > 90* 83* 83* 78*  CREATININE 2.96*   < > 2.74* 2.58* 2.52* 2.52*  CALCIUM 8.7*   < > 8.9 9.7 9.2 9.7  MG 2.0   < >  2.1 2.4  --  2.5*  PROT 6.0*  --   --   --   --   --   ALBUMIN 2.1*  --   --  2.4* 2.3* 2.7*  AST 36  --   --   --   --   --   ALT 16  --   --   --   --   --   ALKPHOS 152*  --   --   --   --   --   BILITOT 0.9  --   --   --   --   --   GFRNONAA 16*   < > 18* 19* 20* 20*  ANIONGAP 15   < > 14 14 14 13    < > = values in this interval not displayed.    Lipids  Recent Labs  Lab 11/22/23 0407  TRIG 138    Hematology Recent Labs  Lab 11/26/23 0434 11/27/23 0322 11/28/23 0430  WBC 10.8* 12.6* 14.1*  RBC 3.15* 2.85* 2.98*  HGB 8.7* 7.9* 8.3*  HCT 28.7* 26.4* 26.8*  MCV 91.1 92.6 89.9  MCH 27.6 27.7 27.9  MCHC 30.3 29.9* 31.0  RDW 16.9* 16.9* 17.2*  PLT 344 374 413*   Thyroid No results for input(s): "TSH", "FREET4" in the last 168 hours.  BNPNo results for input(s): "BNP", "PROBNP" in the last 168 hours.  DDimer No results for input(s): "DDIMER" in the last 168 hours.   Radiology    No results found.  Cardiac Studies   TTE 11/20/2023 1. Left ventricular ejection fraction, by estimation, is 55 to 60%. The  left ventricle has normal function.  The left ventricle has no regional  wall motion abnormalities. There is mild concentric left ventricular  hypertrophy. Left ventricular diastolic  parameters are indeterminate.   2. Right ventricular systolic function is normal. The right ventricular  size is normal.   3. Left atrial size was moderately dilated.   4. The mitral valve is grossly normal. Trivial mitral valve  regurgitation. No evidence of mitral stenosis.   5. The aortic valve is grossly normal. There is mild calcification of the  aortic valve. There is mild thickening of the aortic valve. Aortic valve  regurgitation is not visualized. Aortic valve sclerosis/calcification is  present, without any evidence of  aortic stenosis.   Patient Profile     72 y.o. female with a h/o HFpEF, CKD stage 4, HTN who presented for shoulder surgery, post-op course complicated by respiratory failure and PEA arrest requiring CPR with ROSC, is being seen for new onset Afib with RVR.  Assessment & Plan   Afib/flutter with RVR - heart rate 100-120, still in Afib - continue Iv amiodarone - IV heparin - DOAC on d/c - may need TEE/DCCV if patient does not medically convert  PEA arrest NSTEMI - post-op PEA arrest requiring CPR for 5-6 minutes with achievement of ROSC - patient was intubated and transferred to the ICU requiring pressors. She was extubated 12/13. - off pressors - HS trop 749>804 - echo showed LVEF 55-60% - will eventually need ischemic work-up, however CKD will limit this  HTN - BP mildly elevated - not on antihypertensives - would add low dose BB  AI on CKD stage 4 - Scr 2.52 - baseline SCR 2  For questions or updates, please contact [facility] Please consult [email or web address] for contact info under      Signed, Nikko Goldwire David Stall, PA-C  11/28/2023, 3:34 PM

## 2023-11-28 NOTE — Consult Note (Signed)
PHARMACY - ANTICOAGULATION CONSULT NOTE  Pharmacy Consult for IV Heparin Indication: atrial fibrillation  Patient Measurements: Height: 5\' 3"  (160 cm) Weight: 73.1 kg (161 lb 2.5 oz) IBW/kg (Calculated) : 52.4 Heparin Dosing Weight: 70 kg  Labs: Recent Labs    11/26/23 0434 11/27/23 0322 11/27/23 0335 11/28/23 0430  HGB 8.7* 7.9*  --  8.3*  HCT 28.7* 26.4*  --  26.8*  PLT 344 374  --  413*  HEPARINUNFRC 0.42  --  0.31 0.34  CREATININE 2.58* 2.52*  --  2.52*   Estimated Creatinine Clearance: 19.9 mL/min (A) (by C-G formula based on SCr of 2.52 mg/dL (H)).  Medical History: Past Medical History:  Diagnosis Date   Acute hypoxemic respiratory failure (HCC)    Anemia in stage 4 chronic kidney disease (HCC)    Anginal pain (HCC)    Anxiety    Aortic atherosclerosis (HCC)    CHF (congestive heart failure) (HCC)    a.) TTE 09/30/2021: EF 55-60%, no RWMAs, mild MR, G1DD   Chronic pain    Chronic radicular pain of lower back    CKD (chronic kidney disease), stage IV (HCC)    Complication of anesthesia    a.) delayed emergence following ureteroscopy 07/2023   Coronary artery calcification seen on CT scan    Costochondritis    DDD (degenerative disc disease), lumbar    Depression    Dyspnea    GERD (gastroesophageal reflux disease)    Hiatal hernia    Hip dysplasia    Hyperlipidemia    Hypertension    Insomnia    Iron deficiency    Iron deficiency anemia    Low back pain    Low vitamin B12 level    Lumbar facet joint pain    Migraines    Nose colonized with MRSA 10/03/2020   a.) preop PCR (+) 10/03/2020 prior to RIGHT TKA; b.) preop PCR (+) 08/25/2023 prior to RIGHT REVERSE SHOULDER ARTHROPLASTY; BICEPS TENODESIS   Numbness and tingling of right leg    OA (osteoarthritis)    Obesity    OSA (obstructive sleep apnea)    a.) not currently utilizing nocturnal PAP therapy; positional. PCCM feels as if symptom can be mitigated with diet/exercise/weight loss unless  develops significant symptoms.   Osteoporosis    a.) recieves denosumab injections   Pelvic fracture (HCC)    Pneumonia    Pulmonary fibrosis (HCC)    Raynaud's disease without gangrene    Restless leg syndrome    a.) on pramipexole + oral Fe supplementation   Rheumatoid arthritis (HCC)    a.) Tx'd with hydroxychloroquine   Right renal mass 05/28/2023   a.) CT renal 05/28/2023:  5.7 cm mass in the medial aspect of right kidney   Seasonal allergies    Thoracic compression fracture (HCC)    Medications:  No anticoagulation prior to admission per my chart review  Assessment: 72 y/o F with a past medical history of CKD IV, HFpEF, hypertension, RA, OSA not on CPAP who came [facility]/6 for right reverse shoulder arthroplasty after for suspicion of septic joint. Patient experienced cardiac arrest post-operatively on 12/7. She is intubated, sedated and on mechanical ventilation in the ICU. Hospital course now further complicated by paroxysmal Afib with RVR. Patient has been placed on amiodarone. Pharmacy consulted for heparin.  Baseline aPTT and PT-INR are pending. CBC is notable for anemia which is chronic.  12/10 20:29 HL <0.10 12/11 0544 HL 0.13, subtherapeutic 12/11 1539 HL 0.29,  subtherapeutic 12/12 0039 HL 0.34, therapeutic x 1 12/12 0920 HL 0.28, subtherapeutic 12/12 1817 HL 0.42, therapeutic x 1 12/13 0152 HL 0.39, therapeutic x 2 12/14 0434 HL 0.42, therapeutic x 3 12/15 0322 HL 0.31, therapeutic x 4 12/16 0430 HL 0.34, therapeutic x 5  Goal of Therapy:  Heparin level 0.3-0.7 units/ml Monitor platelets by anticoagulation protocol: Yes   Plan:  --Continue heparin infusion at 1700 units/hr  --Recheck HL daily w/ AM labs while therapeutic --Daily CBC per protocol while on IV heparin  Otelia Sergeant, PharmD, [facility] 11/28/2023 6:53 AM

## 2023-11-28 NOTE — Progress Notes (Signed)
PHARMACY CONSULT NOTE  Pharmacy Consult for Electrolyte Monitoring and Replacement   Recent Labs: Potassium (mmol/L)  Date Value  11/28/2023 3.9   Magnesium (mg/dL)  Date Value  30/86/5784 2.5 (H)   Calcium (mg/dL)  Date Value  69/62/9528 9.7   Albumin (g/dL)  Date Value  41/32/4401 2.7 (L)   Phosphorus (mg/dL)  Date Value  02/72/5366 4.7 (H)   Sodium (mmol/L)  Date Value  11/28/2023 145  09/12/2023 142   Assessment: 72 y.o. female with medical history significant of CKD Stage 4, HFpEF, HTN, B12 deficiency, anemia of CKD, rheumatoid arthritis, Raynaud's phenomenon, OSA not on CPAP, who was admitted on 12/6 for recurrent R shoulder revision following arthroplasty. Pt is on amio infusion.   Goal of Therapy:  Potassium 4.0 - 5.1 mmol/L Magnesium 2.0 - 2.4 mg/dL All Other Electrolytes WNL  Plan:  --20 mEq po KCl x 1 --F/u with AM labs.   Lowella Bandy, PharmD, BCPS 11/28/2023 6:56 AM

## 2023-11-28 NOTE — Progress Notes (Signed)
NAME:  Tonya Cummings, MRN:  [id number], DOB:  Jan 20, 1951, LOS: 10 ADMISSION DATE:  11/18/2023, CHIEF COMPLAINT:  Septic Joint  History of Present Illness:   This is a case of a 72 year old female patient with a past medical history of CKD stage IV, heart failure with preserved EF, hypertension, rheumatoid arthritis with RA-ILD, OSA not on CPAP presents to [facility] on 12/06 for right reverse shoulder arthroplasty after for suspicion of septic joint.  She initially underwent right shoulder arthroplasty on 08/30/2023 after which she developed a.  Periprosthetic fracture status post conversion to a longstem humeral component with ORIF on 10/03/2023.  No evidence of infection at the time.  Follow-up lab work showed elevated inflammatory markers including ESR suspicious for an infectious process vs acute exacerbation of ILD.  Therefore she presents back on 12/06 for shoulder arthroplasty explant of glenoid and humeral component open treatment of right humerus fracture with intramedullary device and insertion of antibiotic delivery system.  Small area of purulence was noted.  Cultures were sent.  Started on daptomycin and ceftriaxone.  She did well immediately postop however on 12/07 she was getting up to the bedside commode she went into respiratory distress with O2 sat dropping becoming apneic and went into PEA arrest.  CPR for roughly 5 to 6-minute prior to ROSC epi x 2.  Not shockable rhythm.  Patient intubated and transferred to the ICU for further care.  Postintubation chest x-ray with right pneumothorax status post chest tube placement 12/07.  HD catheter placed 12/07 anticipating need for dialysis given worsening kidney function.  Echocardiogram 12/08 with normal LVEF 55 to 60%.  RV systolic function is normal and size is normal.  Ultrasound venous lower extremity 12/08 negative for DVT.  Pertinent  Medical History  RA on Hydoxychloroquine HTN HFpEF  Micro Data:   Blood x2 12/6: negative  Right humerus wound 12/6: NGTD Right tuberosity 12/6: NGTD Right glenosphere 12/6: NGTD  MRSA PCR 12/7: negative  Resp 12/9: abundant wbc present, predominantly pmn, rare gram positive cocci in pairs, rare gram negative rods   Anti-infectives (From admission, onward)    Start     Dose/Rate Route Frequency Ordered Stop   11/23/23 1800  cefTRIAXone (ROCEPHIN) 2 g in sodium chloride 0.9 % 100 mL IVPB        2 g 200 mL/hr over 30 Minutes Intravenous Every 24 hours 11/23/23 1711     11/22/23 1400  DAPTOmycin (CUBICIN) IVPB 700 mg/155mL premix        700 mg 200 mL/hr over 30 Minutes Intravenous Every 48 hours 11/21/23 1609     11/22/23 1100  ceFEPIme (MAXIPIME) 2 g in sodium chloride 0.9 % 100 mL IVPB  Status:  Discontinued        2 g 200 mL/hr over 30 Minutes Intravenous Every 24 hours 11/22/23 1009 11/23/23 1711   11/19/23 2245  hydroxychloroquine (PLAQUENIL) tablet 200 mg  Status:  Discontinued        200 mg Per Tube 2 times daily 11/19/23 2152 11/20/23 1041   11/19/23 1800  DAPTOmycin (CUBICIN) IVPB 500 mg/33mL premix  Status:  Discontinued        6 mg/kg  77.1 kg 100 mL/hr over 30 Minutes Intravenous Every 48 hours 11/18/23 1935 11/18/23 1943   11/18/23 2200  hydroxychloroquine (PLAQUENIL) tablet 200 mg  Status:  Discontinued        200 mg Oral 2 times daily 11/18/23 2003 11/19/23 2152   11/18/23 2100  ceFAZolin (ANCEF) IVPB 2g/100 mL premix  Status:  Discontinued        2 g 200 mL/hr over 30 Minutes Intravenous Every 6 hours 11/18/23 2003 11/18/23 2021   11/18/23 2000  DAPTOmycin (CUBICIN) IVPB 500 mg/29mL premix  Status:  Discontinued        6 mg/kg  77.1 kg 100 mL/hr over 30 Minutes Intravenous Every 48 hours 11/18/23 1906 11/18/23 1935   11/18/23 2000  DAPTOmycin (CUBICIN) IVPB 500 mg/57mL premix  Status:  Discontinued        6 mg/kg  77.1 kg 100 mL/hr over 30 Minutes Intravenous Every 48 hours 11/18/23 1943 11/21/23 1609   11/18/23 1930   cefTRIAXone (ROCEPHIN) 2 g in sodium chloride 0.9 % 100 mL IVPB  Status:  Discontinued        2 g 200 mL/hr over 30 Minutes Intravenous Every 24 hours 11/18/23 1832 11/22/23 1009   11/18/23 1602  gentamicin (GARAMYCIN) injection  Status:  Discontinued          As needed 11/18/23 1632 11/18/23 1705   11/18/23 1600  tobramycin (NEBCIN) powder  Status:  Discontinued          As needed 11/18/23 1631 11/18/23 1705   11/18/23 1528  vancomycin (VANCOCIN) powder  Status:  Discontinued          As needed 11/18/23 1628 11/18/23 1705   11/18/23 0600  ceFAZolin (ANCEF) IVPB 2g/100 mL premix        2 g 200 mL/hr over 30 Minutes Intravenous On call to O.R. 11/17/23 2319 11/18/23 1527      Significant Hospital Events: Including procedures, antibiotic start and stop dates in addition to other pertinent events   12/06 admitted for arthroplasty revision explant and insertion of antibiotic device. 12/7 PEA arrest intubated and admitted to the ICU.  Chest x-ray with right pneumothorax status post chest tube placement 12/07.  Status post HD catheter placement 12/07 with worsening kidney function. 12/08 remains intubated and sedated.  12/09 failed SBT due to hypoxia, tolerated PSV during the day 12/10 failed SBT due to low tidal volumes, hypoxia 12/11 Pt remains mechanically intubated on minimal vent settings; performed SBT unable to tolerate PS 5/5 due to hypoxia and low tidal volumes; currently tolerating PS 12/5 11/28/23-patient with RA-ILD with pulmonary fibrosis, currently with AF HR >110 imporved overall.  S/p cardio eval today  Interim History / Subjective:  As outlined above under significant events   Objective   Blood pressure (!) 128/91, pulse 92, temperature 98.4 F (36.9 C), temperature source Oral, resp. rate (!) 26, height 5\' 3"  (1.6 m), weight 73.1 kg, SpO2 96%.        Intake/Output Summary (Last 24 hours) at 11/28/2023 1048 Last data filed at 11/28/2023 0900 Gross per 24 hour  Intake  868.14 ml  Output 1285 ml  Net -416.86 ml   Filed Weights   11/26/23 0500 11/27/23 0552 11/28/23 0400  Weight: 80.4 kg 73.6 kg 73.1 kg    Examination: General: Acutely-ill appearing female, NAD mechanically intubated  HENT: Supple, no JVD  Lungs: Rhonchi throughout, even, non labored  Cardiovascular: NSR, s1s2, no r/g, 2+ radial/2+ distal pulses, no edema  Abdomen: +BS x4, soft, non tender, non distended  Extremities:RUE ROM limited due to recent surgical procedure  Skin: Right shoulder incision site with honeycomb dressing clear/dry/intact  Neuro: Awake, following commands, PERRL  GU: Indwelling foley catheter draining yellow urine   Assessment & Plan:   72 year old female presented  on 12/06 for right reverse shoulder arthroplasty due to suspected septic joint complicated by post operative cardiac arrest (PEA).  #1 Toxic Metabolic Encephalopathy- RESOLVED   - -  #2 PEA Arrest- RESOLVED #3 Afib (new onset) Echo 11/20/23: EF 55 to 60%; trivial mitral valve regurgitation - Continuous telemetry monitoring  - Continue amiodarone and heparin gtts  - Continue aspirin   #4 Acute Hypoxic Respiratory Failure ACUTE EXACERBATION OF ILD - per CT chest imaging    #5 Anemia in stage 4 CKD #6 Iron deficiency anemia  - Trend CBC  - Monitor for s/sx of bleeding - Iron and TIBC results pending  - Transfuse for hgb less than 7 - Continue outpatient ferrous sulfate   #Gastrointestinal - Continues tube feeds, famotidine for SUP  #AKI superimposed on stage 4 CKD - Trend BMP  - Replace electrolytes as indicated  - Strict intake/output  - Avoid nephrotoxic medications   #R. Reverse shoulder arthroplasty (9/17) #Revision with converstion to long stemp humeroal component for periprosthetic fracture (10/21) #Right reverse shoulder arthroplasty explant of glenoid and humeral components for infection (12/6) #HAP - Trend WBC and monitor fever curve  - Trend PCT  - Follow cultures  -  ID consulted appreciate input - Continue abx as outlined above pending culture results and sensitivities  - Orthopedic consulted appreciate input   Endocrine - CBG's q4hrs  - Follow hyper/hypoglycemic protocol  - Target range 140 to 180  Best Practice (right click and "Reselect all SmartList Selections" daily)   Diet/type: tubefeeds DVT prophylaxis systemic heparin Pressure ulcer(s): N/A GI prophylaxis: H2B Lines: Central line and yes and it is still needed Foley:  Yes, and it is still needed Code Status:  full code Last date of multidisciplinary goals of care discussion [11/23/2023]  12/11: Updated pts husband at bedside regarding pts condition and current plan of care  Labs   CBC: Recent Labs  Lab 11/24/23 0448 11/25/23 0152 11/26/23 0434 11/27/23 0322 11/28/23 0430  WBC 8.8 11.0* 10.8* 12.6* 14.1*  HGB 7.5* 8.6* 8.7* 7.9* 8.3*  HCT 23.9* 28.3* 28.7* 26.4* 26.8*  MCV 89.2 93.7 91.1 92.6 89.9  PLT 234 274 344 374 413*    Basic Metabolic Panel: Recent Labs  Lab 11/23/23 0544 11/24/23 0448 11/25/23 0152 11/26/23 0434 11/27/23 0322 11/28/23 0430  NA 136 137 137 143 144 145  K 3.9 4.5 4.6 4.6 4.3 3.9  CL 101 104 103 107 106 107  CO2 22 25 20* 22 24 25   GLUCOSE 155* 146* 92 92 119* 111*  BUN 76* 84* 90* 83* 83* 78*  CREATININE 2.88* 2.61* 2.74* 2.58* 2.52* 2.52*  CALCIUM 8.6* 8.5* 8.9 9.7 9.2 9.7  MG 2.0 2.1 2.1 2.4  --  2.5*  PHOS 4.1 3.4 5.0* 4.8* 5.0* 4.7*   GFR: Estimated Creatinine Clearance: 19.9 mL/min (A) (by C-G formula based on SCr of 2.52 mg/dL (H)). Recent Labs  Lab 11/25/23 0152 11/26/23 0434 11/27/23 0322 11/28/23 0430  WBC 11.0* 10.8* 12.6* 14.1*    Liver Function Tests: Recent Labs  Lab 11/22/23 0407 11/26/23 0434 11/27/23 0322 11/28/23 0430  AST 36  --   --   --   ALT 16  --   --   --   ALKPHOS 152*  --   --   --   BILITOT 0.9  --   --   --   PROT 6.0*  --   --   --   ALBUMIN 2.1* 2.4* 2.3* 2.7*  No results for input(s):  "LIPASE", "AMYLASE" in the last 168 hours. No results for input(s): "AMMONIA" in the last 168 hours.  ABG    Component Value Date/Time   PHART 7.36 11/26/2023 0558   PCO2ART 40 11/26/2023 0558   PO2ART 120 (H) 11/26/2023 0558   HCO3 22.1 11/26/2023 1004   ACIDBASEDEF 3.7 (H) 11/26/2023 1004   O2SAT 96.3 11/26/2023 1004     Coagulation Profile: Recent Labs  Lab 11/22/23 1247  INR 1.3*    Cardiac Enzymes: Recent Labs  Lab 11/23/23 0544  CKTOTAL 35*    HbA1C: Hgb A1c MFr Bld  Date/Time Value Ref Range Status  11/26/2023 04:34 AM 5.4 4.8 - 5.6 % Final    Comment:    (NOTE) Pre diabetes:          5.7%-6.4%  Diabetes:              >6.4%  Glycemic control for   <7.0% adults with diabetes     CBG: Recent Labs  Lab 11/27/23 1616 11/27/23 1933 11/27/23 2327 11/28/23 0336 11/28/23 0720  GLUCAP 101* 104* 110* 109* 98    Past Medical History:  She,  has a past medical history of Acute hypoxemic respiratory failure (HCC), Anemia in stage 4 chronic kidney disease (HCC), Anginal pain (HCC), Anxiety, Aortic atherosclerosis (HCC), CHF (congestive heart failure) (HCC), Chronic pain, Chronic radicular pain of lower back, CKD (chronic kidney disease), stage IV (HCC), Complication of anesthesia, Coronary artery calcification seen on CT scan, Costochondritis, DDD (degenerative disc disease), lumbar, Depression, Dyspnea, GERD (gastroesophageal reflux disease), Hiatal hernia, Hip dysplasia, Hyperlipidemia, Hypertension, Insomnia, Iron deficiency, Iron deficiency anemia, Low back pain, Low vitamin B12 level, Lumbar facet joint pain, Migraines, Nose colonized with MRSA (10/03/2020), Numbness and tingling of right leg, OA (osteoarthritis), Obesity, OSA (obstructive sleep apnea), Osteoporosis, Pelvic fracture (HCC), Pneumonia, Pulmonary fibrosis (HCC), Raynaud's disease without gangrene, Restless leg syndrome, Rheumatoid arthritis (HCC), Right renal mass (05/28/2023), Seasonal allergies,  and Thoracic compression fracture (HCC).   Surgical History:   Past Surgical History:  Procedure Laterality Date   ABDOMINAL SURGERY     pt denies   APPENDECTOMY     CARPAL TUNNEL RELEASE Bilateral    CHOLECYSTECTOMY     COLONOSCOPY WITH PROPOFOL N/A 08/15/2020   Procedure: COLONOSCOPY WITH PROPOFOL;  Surgeon: Earline Mayotte, MD;  Location: [facility];  Service: Endoscopy;  Laterality: N/A;   CYSTOSCOPY W/ RETROGRADES Right 07/18/2023   Procedure: CYSTOSCOPY WITH RETROGRADE PYELOGRAM;  Surgeon: [name], MD;  Location: [facility] ORS;  Service: Urology;  Laterality: Right;   DILATION AND CURETTAGE OF UTERUS     ESOPHAGOGASTRODUODENOSCOPY (EGD) WITH PROPOFOL N/A 08/15/2020   Procedure: ESOPHAGOGASTRODUODENOSCOPY (EGD) WITH PROPOFOL;  Surgeon: Earline Mayotte, MD;  Location: [facility];  Service: Endoscopy;  Laterality: N/A;   FRACTURE SURGERY     hip fracture    FRACTURE SURGERY     pelvic fracture plate    HIP SURGERY Left    KNEE ARTHROPLASTY Right 10/13/2020   Procedure: COMPUTER ASSISTED TOTAL KNEE ARTHROPLASTY - RNFA;  Surgeon: Donato Heinz, MD;  Location: [facility] OR[facility];  Service: Orthopedics;  Laterality: Right;   REVERSE SHOULDER ARTHROPLASTY Right 08/30/2023   Procedure: Right reverse shoulder arthroplasty, biceps tenodesis;  Surgeon: Signa Kell, MD;  Location: [facility];  Service: Orthopedics;  Laterality: Right;   TOTAL SHOULDER REVISION Right 10/03/2023   Procedure: Revision right reverse shoulder arthroplasty with conversion to long humeral stem and open reduction internal fixation of the  humerus;  Surgeon: Signa Kell, MD;  Location: [facility];  Service: Orthopedics;  Laterality: Right;   TOTAL SHOULDER REVISION Right 11/18/2023   Procedure: Right revision reverse shoulder arthroplasty (conversion to longstem and glenosphere exchange);  Surgeon: Signa Kell, MD;  Location: [facility];  Service: Orthopedics;  Laterality: Right;   TUBAL LIGATION     URETEROSCOPY   07/18/2023   Procedure: DIAGNOSTIC URETEROSCOPY;  Surgeon: [name], MD;  Location: [facility];  Service: Urology;;     Social History:   reports that she has never smoked. She has never used smokeless tobacco. She reports that she does not drink alcohol and does not use drugs.   Family History:  Her family history includes Aneurysm in her father; Cancer in her brother; Diabetes in her brother, maternal grandfather, mother, and son; Heart disease in her maternal grandfather; Hypertension in her mother; Osteosarcoma in her brother; Seizures in her son.   Allergies Allergies  Allergen Reactions   Gabapentin Other (See Comments)    Weight gain   Ibuprofen Other (See Comments)    Headache     Home Medications  Prior to Admission medications   Medication Sig Start Date End Date Taking? Authorizing Provider  acetaminophen (TYLENOL) 500 MG tablet Take 2 tablets (1,000 mg total) by mouth every 6 (six) hours as needed. 08/31/23  Yes Anson Oregon, PA-C  aspirin EC 325 MG tablet Take 1 tablet (325 mg total) by mouth daily. 08/31/23  Yes Anson Oregon, PA-C  calcitRIOL (ROCALTROL) 0.25 MCG capsule Take 0.25 mcg by mouth every morning. 05/22/21  Yes [provider]  Calcium Carb-Cholecalciferol 600-400 MG-UNIT CAPS Take 1 capsule by mouth 3 (three) times daily. 01/13/10  Yes [provider]  chlorhexidine (HIBICLENS) 4 % external liquid Apply 15 mLs (1 Application total) topically as directed for 30 doses. Use as directed daily for 5 days every other week for 6 weeks. 11/18/23  Yes Signa Kell, MD  Cholecalciferol 25 MCG (1000 UT) tablet Take 1,000 Units by mouth every morning. 09/23/09  Yes [provider]  cyanocobalamin 1000 MCG tablet Take 1,000 mcg by mouth every morning.   Yes [provider]  cyclobenzaprine (FLEXERIL) 10 MG tablet Take 10 mg by mouth at bedtime as needed for muscle spasms (Leg cramps).   Yes [provider]   enalapril-hydrochlorothiazide (VASERETIC) 10-25 MG tablet Take 1 tablet by mouth at bedtime. 09/12/23  Yes Hammock, Lavonna Rua, NP  ferrous sulfate 325 (65 FE) MG tablet Take 325 mg by mouth daily with breakfast.   Yes [provider]  furosemide (LASIX) 40 MG tablet Take 1.5 tablets (60 mg total) by mouth every morning. Increase to 1.5 tablet (60 mg total) by mouth in morning and extra 1 tablet (40 mg total for maximum daily dose 100 mg) at lunch time as needed for up to 3 days for increased leg swelling, shortness of breath, weight gain 5+ lbs over 1-2 days. Seek medical care if these symptoms are not improving with increased dose. 09/02/23  Yes Sunnie Nielsen, DO  hydroxychloroquine (PLAQUENIL) 200 MG tablet Take 200 mg by mouth 2 (two) times daily. 06/22/21  Yes [provider]  mirtazapine (REMERON) 30 MG tablet Take 30 mg by mouth at bedtime. 06/29/22  Yes [provider]  Multiple Vitamins-Minerals (MULTIVITAMIN ADULT PO) Take 1 tablet by mouth every morning. 09/10/08  Yes [provider]  mupirocin ointment (BACTROBAN) 2 % Apply small about inside of both nostrils TWICE a day for the  next 5 days. 08/25/23  Yes Verlee Monte, NP  mupirocin ointment (BACTROBAN) 2 % Place 1 Application into the nose 2 (two) times daily for 60 doses. Use as directed 2 times daily for 5 days every other week for 6 weeks. 11/18/23 12/18/23 Yes Signa Kell, MD  omeprazole (PRILOSEC) 40 MG capsule Take 40 mg by mouth every morning.   Yes [provider]  ondansetron (ZOFRAN) 4 MG tablet Take 1 tablet (4 mg total) by mouth every 6 (six) hours as needed for nausea. 08/31/23  Yes Anson Oregon, PA-C  oxyCODONE (OXY IR/ROXICODONE) 5 MG immediate release tablet Take 1 tablet (5 mg total) by mouth every 4 (four) hours as needed for moderate pain (pain score 4-6) (pain score 4-6). 10/04/23  Yes Dedra Skeens, PA-C  Pirfenidone 267 MG TABS Take 801 tablets by mouth 3 (three) times daily.  12/24/22  Yes [provider]  pramipexole (MIRAPEX) 0.125 MG tablet Take 0.125-0.25 mg by mouth See admin instructions. Take 0.125 mg in the morning and 0.25 mg  at bedtime 02/15/18  Yes [provider]  PROLIA 60 MG/ML SOSY injection Inject 60 mg into the skin every 6 (six) months. 04/06/23  Yes [provider]  rosuvastatin (CRESTOR) 5 MG tablet Take 5 mg by mouth every other day. 07/18/19  Yes [provider]  spironolactone (ALDACTONE) 25 MG tablet Take 25 mg by mouth at bedtime. 04/09/21  Yes [provider]  tetrahydrozoline 0.05 % ophthalmic solution Place 1 drop into both eyes daily as needed (allergies).   Yes [provider]  venlafaxine XR (EFFEXOR-XR) 150 MG 24 hr capsule Take 150 mg by mouth See admin instructions. Take with 75 mg for total 225 mg in the morning 12/28/22 12/28/23 Yes [provider]  venlafaxine XR (EFFEXOR-XR) 75 MG 24 hr capsule Take 75 mg by mouth See admin instructions. Take with 150 mg for a total of 225 mg in the morning 08/24/23  Yes [provider]  chlorhexidine (HIBICLENS) 4 % external liquid Apply 15 mLs (1 Application total) topically as directed for 30 doses. Use as directed daily for 5 days every other week for 6 weeks. Patient not taking: Reported on 11/17/2023 10/03/23   Signa Kell, MD      Vida Rigger, M.D.  Pulmonary & Critical Care Medicine  [facility]

## 2023-11-28 NOTE — Progress Notes (Signed)
[facility]  ROUNDING NOTE   Subjective:   Tonya Cummings is a 72  y.o. female with past medical conditions including chronic kidney disease stage IV, proteinuria, hypertension, anemia of chronic kidney disease, RA, OSA and secondary hyperparathyroidism. Patient presented to the hospital for planned right shoulder arthroplasty which was later complicated by PEA cardiac arrest.  Patient is known to our practice and is followed outpatient by Dr. Cherylann Ratel.  Patient is seen and evaluated at bedside in ICU.    Laying in the bed.  Able to answer simple questions.  Denies any acute complaints.  Objective:  Vital signs in last 24 hours:  Temp:  [97.4 F (36.3 C)-98.4 F (36.9 C)] 97.8 F (36.6 C) (12/16 1200) Pulse Rate:  [38-158] 81 (12/16 1500) Resp:  [14-30] 27 (12/16 1500) BP: (91-168)/(49-107) 142/75 (12/16 1400) SpO2:  [91 %-100 %] 96 % (12/16 1500) Weight:  [73.1 kg] 73.1 kg (12/16 0400)  Weight change: -0.5 kg Filed Weights   11/26/23 0500 11/27/23 0552 11/28/23 0400  Weight: 80.4 kg 73.6 kg 73.1 kg    Intake/Output: I/O last 3 completed shifts: In: 1329.6 [I.V.:1203; IV Piggyback:126.5] Out: 2210 [Urine:2210]   Intake/Output this shift:  Total I/O In: 240.6 [I.V.:235.4; IV Piggyback:5.2] Out: 250 [Urine:250]  Physical Exam: General: Ill appearing, laying in bed  Head: Normocephalic, atraumatic. Moist oral mucosal membranes  Eyes: Anicteric  Lungs:  Coarse breath sounds, [place] O2  Heart: irregular, atrial fibrillation/flutter  Abdomen:  Soft, nontender  Extremities:  No peripheral edema.  Neurologic: Alert, able to answer simple questions  Skin: No lesions       Basic Metabolic Panel: Recent Labs  Lab 11/23/23 0544 11/24/23 0448 11/25/23 0152 11/26/23 0434 11/27/23 0322 11/28/23 0430  NA 136 137 137 143 144 145  K 3.9 4.5 4.6 4.6 4.3 3.9  CL 101 104 103 107 106 107  CO2 22 25 20* 22 24 25   GLUCOSE 155* 146* 92 92 119* 111*  BUN 76* 84* 90*  83* 83* 78*  CREATININE 2.88* 2.61* 2.74* 2.58* 2.52* 2.52*  CALCIUM 8.6* 8.5* 8.9 9.7 9.2 9.7  MG 2.0 2.1 2.1 2.4  --  2.5*  PHOS 4.1 3.4 5.0* 4.8* 5.0* 4.7*    Liver Function Tests: Recent Labs  Lab 11/22/23 0407 11/26/23 0434 11/27/23 0322 11/28/23 0430  AST 36  --   --   --   ALT 16  --   --   --   ALKPHOS 152*  --   --   --   BILITOT 0.9  --   --   --   PROT 6.0*  --   --   --   ALBUMIN 2.1* 2.4* 2.3* 2.7*   No results for input(s): "LIPASE", "AMYLASE" in the last 168 hours. No results for input(s): "AMMONIA" in the last 168 hours.  CBC: Recent Labs  Lab 11/24/23 0448 11/25/23 0152 11/26/23 0434 11/27/23 0322 11/28/23 0430  WBC 8.8 11.0* 10.8* 12.6* 14.1*  HGB 7.5* 8.6* 8.7* 7.9* 8.3*  HCT 23.9* 28.3* 28.7* 26.4* 26.8*  MCV 89.2 93.7 91.1 92.6 89.9  PLT 234 274 344 374 413*    Cardiac Enzymes: Recent Labs  Lab 11/23/23 0544  CKTOTAL 35*    BNP: Invalid input(s): "POCBNP"  CBG: Recent Labs  Lab 11/27/23 1933 11/27/23 2327 11/28/23 0336 11/28/23 0720 11/28/23 1109  GLUCAP 104* 110* 109* 98 113*    Microbiology: Results for orders placed or performed during the hospital encounter of 11/18/23  Surgical pcr screen     Status: Abnormal   Collection Time: 11/18/23 12:11 PM   Specimen: Nasal Mucosa; Nasal Swab  Result Value Ref Range Status   MRSA, PCR NEGATIVE NEGATIVE Final   Staphylococcus aureus POSITIVE (A) NEGATIVE Final    Comment: (NOTE) The Xpert SA Assay (FDA approved for NASAL specimens in patients 37 years of age and older), is one component of a comprehensive surveillance program. It is not intended to diagnose infection nor to guide or monitor treatment. Performed at [facility], [address], [place], [place] [address]   Aerobic/Anaerobic Culture w Gram Stain (surgical/deep wound)     Status: None (Preliminary result)   Collection Time: 11/18/23  2:29 PM   Specimen: Path Tissue  Result Value Ref Range Status    Specimen Description   Final    TISSUE Performed at [facility], [address] [place], [place] [address]    Special Requests RT HUMERUS HOLD 21DAYS  Final   Gram Stain   Final    RARE WBC PRESENT,BOTH PMN AND MONONUCLEAR NO ORGANISMS SEEN    Culture   Final    CULTURE REINCUBATED FOR BETTER GROWTH CONTINUING TO HOLD Performed at [facility], [address] [place], [place] [address]    Report Status PENDING  Incomplete  Aerobic/Anaerobic Culture w Gram Stain (surgical/deep wound)     Status: None (Preliminary result)   Collection Time: 11/18/23  2:43 PM   Specimen: Path Tissue  Result Value Ref Range Status   Specimen Description   Final    TISSUE Performed at [facility], [address] [place], [place] [address]    Special Requests TUBEROSITY,HOLD 21DAYS  Final   Gram Stain   Final    FEW WBC PRESENT,BOTH PMN AND MONONUCLEAR NO ORGANISMS SEEN    Culture   Final    NO GROWTH 10 DAYS CONTINUING TO HOLD Performed at [facility], [address] [place], [place] [address]    Report Status PENDING  Incomplete  Aerobic/Anaerobic Culture w Gram Stain (surgical/deep wound)     Status: None (Preliminary result)   Collection Time: 11/18/23  2:52 PM   Specimen: Path Tissue  Result Value Ref Range Status   Specimen Description   Final    TISSUE Performed at [facility], [address] [place], [place] [address]    Special Requests RT HUMERAL TRAY HOLD 21 DAYS  Final   Gram Stain   Final    RARE WBC PRESENT, PREDOMINANTLY MONONUCLEAR NO ORGANISMS SEEN    Culture   Final    CULTURE REINCUBATED FOR BETTER GROWTH CONTINUING TO HOLD Performed at [facility], [address] [place], [place] [address]    Report Status PENDING  Incomplete  Aerobic/Anaerobic Culture w Gram Stain (surgical/deep wound)     Status: None (Preliminary result)   Collection Time: 11/18/23  2:58 PM   Specimen: Path Tissue  Result Value Ref Range  Status   Specimen Description   Final    TISSUE Performed at [facility], [address] [place], [place] [address]    Special Requests GLENOSPHERE,HOLD 21 DAYS  Final   Gram Stain   Final    RARE WBC PRESENT,BOTH PMN AND MONONUCLEAR NO ORGANISMS SEEN    Culture   Final    NO GROWTH 10 DAYS CONTINUING TO HOLD Performed at [facility], [address], [place], [place] [address]  Report Status PENDING  Incomplete  Aerobic/Anaerobic Culture w Gram Stain (surgical/deep wound)     Status: None (Preliminary result)   Collection Time: 11/18/23  3:06 PM   Specimen: Path Tissue  Result Value Ref Range Status   Specimen Description   Final    TISSUE Performed at [facility], [address] [place], [place] [address]    Special Requests RT HUMEREUS CANAL HOLD 21DAYS  Final   Gram Stain   Final    FEW WBC PRESENT, PREDOMINANTLY MONONUCLEAR NO ORGANISMS SEEN    Culture   Final    NO GROWTH 10 DAYS CONTINUING TO HOLD Performed at [facility], [address] [place], [place] [address]    Report Status PENDING  Incomplete  Culture, blood (Routine X 2) w Reflex to ID Panel     Status: None   Collection Time: 11/18/23  7:22 PM   Specimen: BLOOD  Result Value Ref Range Status   Specimen Description BLOOD BLOOD LEFT HAND  Final   Special Requests   Final    BOTTLES DRAWN AEROBIC AND ANAEROBIC Blood Culture adequate volume   Culture   Final    NO GROWTH 5 DAYS Performed at [facility], [address] [place], [place] [address]    Report Status 11/23/2023 FINAL  Final  Culture, blood (Routine X 2) w Reflex to ID Panel     Status: None   Collection Time: 11/18/23  7:28 PM   Specimen: BLOOD  Result Value Ref Range Status   Specimen Description BLOOD BLOOD LEFT ARM  Final   Special Requests   Final    BOTTLES DRAWN AEROBIC AND ANAEROBIC Blood Culture adequate volume   Culture   Final    NO GROWTH 5 DAYS Performed at [facility], [address] [place], [place] [address]    Report Status 11/23/2023 FINAL  Final  MRSA Next Gen by PCR, Nasal     Status: None   Collection Time: 11/19/23  1:57 PM   Specimen: Nasal Mucosa; Nasal Swab  Result Value Ref Range Status   MRSA by PCR Next Gen NOT DETECTED NOT DETECTED Final    Comment: (NOTE) The GeneXpert MRSA Assay (FDA approved for NASAL specimens only), is one component of a comprehensive MRSA colonization surveillance program. It is not intended to diagnose MRSA infection nor to guide or monitor treatment for MRSA infections. Test performance is not FDA approved in patients less than 8 years old. Performed at [facility], [address] [place], [place] [address]   Culture, Respiratory w Gram Stain     Status: None   Collection Time: 11/21/23  3:18 PM   Specimen: Tracheal Aspirate; Respiratory  Result Value Ref Range Status   Specimen Description   Final    TRACHEAL ASPIRATE Performed at [facility], [address], [place], [place] [address]    Special Requests   Final    NONE Performed at [facility], [address] [place], [place] [address]    Gram Stain   Final    ABUNDANT WBC PRESENT, PREDOMINANTLY PMN RARE GRAM POSITIVE COCCI IN PAIRS RARE GRAM NEGATIVE RODS    Culture   Final    FEW Normal respiratory flora-no Staph aureus or Pseudomonas seen Performed at [facility], [address] [place], [place] [address]    Report Status 11/23/2023 FINAL  Final    Coagulation Studies: No results for input(s): "LABPROT", "INR"  in the last 72 hours.   Urinalysis: No results for input(s): "COLORURINE", "LABSPEC", "PHURINE", "GLUCOSEU", "HGBUR", "BILIRUBINUR", "KETONESUR", "PROTEINUR", "UROBILINOGEN", "NITRITE", "LEUKOCYTESUR" in the last 72 hours.  Invalid input(s): "APPERANCEUR"    Imaging: No results found.    Medications:    amiodarone 30 mg/hr (11/28/23 1511)   cefTRIAXone (ROCEPHIN)  IV  Stopped (11/27/23 1727)   DAPTOmycin 200 mL/hr at 11/28/23 1400   heparin 1,700 Units/hr (11/28/23 1400)    aspirin  325 mg Oral Daily   calcitRIOL  0.25 mcg Oral q morning   calcium-vitamin D  1 tablet Oral TID   Chlorhexidine Gluconate Cloth  6 each Topical Daily   cholecalciferol  1,000 Units Oral q morning   cyanocobalamin  1,000 mcg Oral q morning   ferrous sulfate  300 mg Oral Daily   lidocaine  1 patch Transdermal Q24H   mupirocin ointment   Topical BID   pramipexole  0.125 mg Oral q morning   And   pramipexole  0.25 mg Oral QHS   sodium chloride flush  3 mL Intravenous Q12H   sodium chloride flush  3 mL Intravenous Q12H   bisacodyl, ipratropium-albuterol, metoprolol tartrate, morphine injection, mouth rinse, mouth rinse, mouth rinse, polyethylene glycol, senna-docusate  Assessment/ Plan:  [name] is a 72 y.o.  female with past medical conditions including chronic kidney disease stage IV, proteinuria, hypertension, anemia of chronic kidney disease, RA, OSA and secondary hyperparathyroidism. Patient presented to the hospital for planned right shoulder arthroplasty which was later complicated by PEA cardiac arrest.  She is currently admitted for Periprosthetic fracture around internal prosthetic right shoulder joint [M97.31XA] Septic arthritis (HCC) [M00.9]   Acute Kidney Injury on chronic kidney disease stage 4 with baseline creatinine 2.08 on 10/04/2023.  Acute kidney injury secondary to ATN from hypoperfusion.  Chronic kidney disease secondary to hypertension and rheumatoid arthritis with prior NSAID use.  No IV contrast exposure.    -Serum creatinine has stabilized.  Electrolytes and volume status are acceptable.  No acute indication for dialysis at present.  Urine output recorded at 1200 cc yesterday.   Lab Results  Component Value Date   CREATININE 2.52 (H) 11/28/2023   CREATININE 2.52 (H) 11/27/2023   CREATININE 2.58 (H) 11/26/2023    Intake/Output  Summary (Last 24 hours) at 11/28/2023 1557 Last data filed at 11/28/2023 1400 Gross per 24 hour  Intake 873.34 ml  Output 1260 ml  Net -386.66 ml   2. Anemia of chronic kidney disease with acute blood loss Lab Results  Component Value Date   HGB 8.3 (L) 11/28/2023  Patient has received blood transfusions during this admission.   3. Secondary Hyperparathyroidism: with outpatient labs: PTH 45, phosphorus 4.7, calcium 9.4 on 11/07/23.   Lab Results  Component Value Date   CALCIUM 9.7 11/28/2023   PHOS 4.7 (H) 11/28/2023    Will continue to monitor bone minerals during this admission.  Continue calcitriol.  4.  Right reverse shoulder arthroplasty loosening after periprosthetic fracture with a right periprosthetic infection and humerus fractures.  Patient underwent right reverse shoulder arthroplasty explant of glenoid and humeral components and open treatment of right humerus fracture with intramedullary device.  This was done on 11/18/2023. Currently receiving IV ceftriaxone and IV daptomycin.     LOS: 10 Justin Meisenheimer 12/16/20243:57 PM

## 2023-11-28 NOTE — Progress Notes (Signed)
Progress Note   Patient: Tonya Cummings ZOX:[id number] DOB: 1951-06-05 DOA: 11/18/2023     10 DOS: the patient was seen and examined on 11/28/2023   Brief hospital course:  72 year old female patient with a past medical history of CKD stage IV, heart failure with preserved EF, hypertension, rheumatoid arthritis, OSA not on CPAP presents to [facility] on 12/06 for right reverse shoulder arthroplasty after for suspicion of septic joint. She initially underwent right shoulder arthroplasty on 08/30/2023 after which she developed a.  Periprosthetic fracture status post conversion to a longstem humeral component with ORIF on 10/03/2023.  No evidence of infection at the time. Follow-up lab work showed elevated inflammatory markers including ESR suspicious for an infectious process.  Therefore she presents back on 12/06 for shoulder arthroplasty explant of glenoid and humeral component open treatment of right humerus fracture with intramedullary device and insertion of antibiotic delivery system.  Small area of purulence was noted.  Cultures were sent.  Started on daptomycin and ceftriaxone.   She did well immediately postop however on 12/07 she was getting up to the bedside commode she went into respiratory distress with O2 sat dropping becoming apneic and went into PEA arrest.  CPR for roughly 5 to 6-minute prior to ROSC epi x 2.  Not shockable rhythm.  Patient intubated and transferred to the ICU for further care.  Postintubation chest x-ray with right pneumothorax status post chest tube placement 12/07.  HD catheter placed 12/07 anticipating need for dialysis given worsening kidney function.   During ICU stay patient had circulatory shock requiring pressors.  She has elevated troponin in the setting of cardiac arrest, new onset A-fib.  Echocardiogram 12/08 with normal LVEF 55 to 60%.  RV systolic function is normal and size is normal. Ultrasound venous lower extremity 12/08 negative  for DVT. Patient had pneumothorax 12/7 for which a right chest tube was placed which is removed 12/11.  Her kidney function remained stable and nephrologist followed advised no acute indication for dialysis. She is successfully extubated 12/13 and transferred to [facility] service.   Assessment and Plan: PEA arrest NSTEMI New onset A-fib: Circulatory shock, off pressors now. She is on heparin gtt. Echo showed EF 55-60%.  She is continued on amiodarone gtt for rapid A-fib per cardiology recommendations.  Heart rate 110-150. Cardiology follow-up appreciated, start Lopressor once able to take p.o., she will need anticoagulant prior to discharge. --cont amio gtt and heparin gtt  Acute hypoxic respiratory failure Right pneumothorax Status post right-sided chest tube placed 12/7, removed 12/11. Possible aspiration pneumonia Patient is currently on 2 L supplemental oxygen postextubation 12/13. Continue aggressive pulmonary toilet, DuoNebs as needed. Encourage out of bed to chair, incentive spirometry. ----Continue supplemental O2 to keep sats >=92%, wean as tolerated  Acute toxic metabolic encephalopathy In the setting of PEA arrest requiring airway protection, on sedation. Patient is more alert and awake today morning.  She does have hoarse voice but able to answer me appropriately.  Dysphagia: Hoarseness s/p extubation. SLP evaluation and rec NPO RN unable to place NG as she cannot swallow, patient refused tube placement. --SLP eval --NS@50   Acute on chronic kidney injury stage 4: Acute kidney injury in the setting of hypoperfusion. Avoid nephrotoxic drugs.   --NS@50   Right reverse shoulder arthroplasty 9/17, status post revision surgery on 1021, 11/18/2023- Hardware explanted 12/6.  ID consulted.  Tissue cultures so far negative. --cont ceftriaxone and dapto  Anemia of chronic disease: Hemoglobin stable.  No active bleeding. Continue  iron, B12 supplements.  Obesity with BMI  31.40 Contributing to her current condition. OSA not on CPAP. Diet, exercise and weight reduction advised. --BG q6h to monitor for hypogylcemia  Debility and deconditioning: Out of bed to chair, encourage incentive spirometry. PT OT evaluation.    Nursing supportive care. Fall, aspiration precautions. DVT prophylaxis heparin drip   Code Status: Full Code  Subjective:  Pt denied dyspnea.  Right shoulder painful and couldn't move.  Pt said she will not have NG tube placed due to pain.   Physical Exam: Vitals:   11/28/23 1500 11/28/23 1600 11/28/23 1700 11/28/23 1800  BP:  130/87  (!) 163/69  Pulse: 81 85 97 (!) 56  Resp: (!) 27 (!) 22 (!) 23 (!) 22  Temp:  (!) 97.4 F (36.3 C)    TempSrc:  Axillary    SpO2: 96% 95% 97% 99%  Weight:      Height:        Constitutional: NAD, AAOx3 HEENT: conjunctivae and lids normal, EOMI, voice weak and hoarse CV: No cyanosis.   RESP: normal respiratory effort Neuro: II - XII grossly intact.   Psych: Normal mood and affect.     Data Reviewed:      Latest Ref Rng & Units 11/28/2023    4:30 AM 11/27/2023    3:22 AM 11/26/2023    4:34 AM  CBC  WBC 4.0 - 10.5 K/uL 14.1  12.6  10.8   Hemoglobin 12.0 - 15.0 g/dL 8.3  7.9  8.7   Hematocrit 36.0 - 46.0 % 26.8  26.4  28.7   Platelets 150 - 400 K/uL 413  374  344       Latest Ref Rng & Units 11/28/2023    4:30 AM 11/27/2023    3:22 AM 11/26/2023    4:34 AM  BMP  Glucose 70 - 99 mg/dL 161  096  92   BUN 8 - 23 mg/dL 78  83  83   Creatinine 0.44 - 1.00 mg/dL 0.45  4.09  8.11   Sodium 135 - 145 mmol/L 145  144  143   Potassium 3.5 - 5.1 mmol/L 3.9  4.3  4.6   Chloride 98 - 111 mmol/L 107  106  107   CO2 22 - 32 mmol/L 25  24  22    Calcium 8.9 - 10.3 mg/dL 9.7  9.2  9.7    No results found.    Family Communication:  Disposition: Status is: Inpatient Remains inpatient appropriate because: A-fib with RVR, IV antibiotics, IV heparin, cardiology follow-up  Planned Discharge  Destination:  to be determined     MDM level 3   Author: Darlin Priestly, MD 11/28/2023 7:29 PM Secure chat 7am to 7pm For on call review [email or web address].

## 2023-11-28 NOTE — Plan of Care (Signed)
  Problem: Health Behavior/Discharge Planning: Goal: Ability to manage health-related needs will improve Outcome: Progressing   Problem: Clinical Measurements: Goal: Ability to maintain clinical measurements within normal limits will improve Outcome: Progressing Goal: Will remain free from infection Outcome: Progressing Goal: Diagnostic test results will improve Outcome: Progressing Goal: Respiratory complications will improve Outcome: Progressing Goal: Cardiovascular complication will be avoided Outcome: Progressing   Problem: Activity: Goal: Risk for activity intolerance will decrease Outcome: Progressing   Problem: Nutrition: Goal: Adequate nutrition will be maintained Outcome: Progressing   Problem: Coping: Goal: Level of anxiety will decrease Outcome: Progressing   Problem: Elimination: Goal: Will not experience complications related to bowel motility Outcome: Progressing Goal: Will not experience complications related to urinary retention Outcome: Progressing   Problem: Pain Management: Goal: General experience of comfort will improve Outcome: Progressing   Problem: Safety: Goal: Ability to remain free from injury will improve Outcome: Progressing   Problem: Skin Integrity: Goal: Risk for impaired skin integrity will decrease Outcome: Progressing   Problem: Education: Goal: Knowledge of the prescribed therapeutic regimen will improve Outcome: Progressing Goal: Understanding of activity limitations/precautions following surgery will improve Outcome: Progressing Goal: Individualized Educational Video(s) Outcome: Progressing   Problem: Activity: Goal: Ability to tolerate increased activity will improve Outcome: Progressing   Problem: Pain Management: Goal: Pain level will decrease with appropriate interventions Outcome: Progressing   Problem: Activity: Goal: Ability to tolerate increased activity will improve Outcome: Progressing   Problem:  Respiratory: Goal: Ability to maintain a clear airway and adequate ventilation will improve Outcome: Progressing   Problem: Role Relationship: Goal: Method of communication will improve Outcome: Progressing

## 2023-11-29 ENCOUNTER — Inpatient Hospital Stay: Payer: Medicare HMO

## 2023-11-29 DIAGNOSIS — Z7189 Other specified counseling: Secondary | ICD-10-CM

## 2023-11-29 DIAGNOSIS — M9731XD Periprosthetic fracture around internal prosthetic right shoulder joint, subsequent encounter: Secondary | ICD-10-CM | POA: Diagnosis not present

## 2023-11-29 DIAGNOSIS — I4892 Unspecified atrial flutter: Secondary | ICD-10-CM

## 2023-11-29 LAB — RENAL FUNCTION PANEL
Albumin: 2.6 g/dL — ABNORMAL LOW (ref 3.5–5.0)
Anion gap: 15 (ref 5–15)
BUN: 81 mg/dL — ABNORMAL HIGH (ref 8–23)
CO2: 23 mmol/L (ref 22–32)
Calcium: 9.6 mg/dL (ref 8.9–10.3)
Chloride: 111 mmol/L (ref 98–111)
Creatinine, Ser: 2.8 mg/dL — ABNORMAL HIGH (ref 0.44–1.00)
GFR, Estimated: 18 mL/min — ABNORMAL LOW (ref >60)
Glucose, Bld: 110 mg/dL — ABNORMAL HIGH (ref 70–99)
Phosphorus: 5 mg/dL — ABNORMAL HIGH (ref 2.5–4.6)
Potassium: 4.3 mmol/L (ref 3.5–5.1)
Sodium: 149 mmol/L — ABNORMAL HIGH (ref 135–145)

## 2023-11-29 LAB — CBC
HCT: 25.1 % — ABNORMAL LOW (ref 36.0–46.0)
Hemoglobin: 7.8 g/dL — ABNORMAL LOW (ref 12.0–15.0)
MCH: 28.5 pg (ref 26.0–34.0)
MCHC: 31.1 g/dL (ref 30.0–36.0)
MCV: 91.6 fL (ref 80.0–100.0)
Platelets: 419 10*3/uL — ABNORMAL HIGH (ref 150–400)
RBC: 2.74 MIL/uL — ABNORMAL LOW (ref 3.87–5.11)
RDW: 17.4 % — ABNORMAL HIGH (ref 11.5–15.5)
WBC: 15.5 10*3/uL — ABNORMAL HIGH (ref 4.0–10.5)
nRBC: 0.5 % — ABNORMAL HIGH (ref 0.0–0.2)

## 2023-11-29 LAB — GLUCOSE, CAPILLARY
Glucose-Capillary: 91 mg/dL (ref 70–99)
Glucose-Capillary: 94 mg/dL (ref 70–99)
Glucose-Capillary: 90 mg/dL (ref 70–99)

## 2023-11-29 LAB — HEPARIN LEVEL (UNFRACTIONATED)
Heparin Unfractionated: 0.19 [IU]/mL — ABNORMAL LOW (ref 0.30–0.70)
Heparin Unfractionated: 0.57 [IU]/mL (ref 0.30–0.70)

## 2023-11-29 MED ORDER — SODIUM CHLORIDE 0.45 % IV SOLN: INTRAVENOUS | Status: DC

## 2023-11-29 MED ORDER — HEPARIN BOLUS VIA INFUSION
2100.0000 [IU] | Freq: Once | INTRAVENOUS | Status: AC
Start: 2023-11-29 — End: 2023-11-29
  Administered 2023-11-29: 2100 [IU] via INTRAVENOUS
  Filled 2023-11-29: qty 2100

## 2023-11-29 NOTE — Progress Notes (Signed)
NAME:  Tonya Cummings, MRN:  [id number], DOB:  09/07/1951, LOS: 11 ADMISSION DATE:  11/18/2023, CHIEF COMPLAINT:  Septic Joint  History of Present Illness:   This is a case of a 72 year old female patient with a past medical history of CKD stage IV, heart failure with preserved EF, hypertension, rheumatoid arthritis with RA-ILD, OSA not on CPAP presents to [facility] on 12/06 for right reverse shoulder arthroplasty after for suspicion of septic joint.  She initially underwent right shoulder arthroplasty on 08/30/2023 after which she developed a.  Periprosthetic fracture status post conversion to a longstem humeral component with ORIF on 10/03/2023.  No evidence of infection at the time.  Follow-up lab work showed elevated inflammatory markers including ESR suspicious for an infectious process vs acute exacerbation of ILD.  Therefore she presents back on 12/06 for shoulder arthroplasty explant of glenoid and humeral component open treatment of right humerus fracture with intramedullary device and insertion of antibiotic delivery system.  Small area of purulence was noted.  Cultures were sent.  Started on daptomycin and ceftriaxone.  She did well immediately postop however on 12/07 she was getting up to the bedside commode she went into respiratory distress with O2 sat dropping becoming apneic and went into PEA arrest.  CPR for roughly 5 to 6-minute prior to ROSC epi x 2.  Not shockable rhythm.  Patient intubated and transferred to the ICU for further care.  Postintubation chest x-ray with right pneumothorax status post chest tube placement 12/07.  HD catheter placed 12/07 anticipating need for dialysis given worsening kidney function.  Echocardiogram 12/08 with normal LVEF 55 to 60%.  RV systolic function is normal and size is normal.  Ultrasound venous lower extremity 12/08 negative for DVT.  Pertinent  Medical History  RA on Hydoxychloroquine HTN HFpEF  Micro Data:   Blood x2 12/6: negative  Right humerus wound 12/6: NGTD Right tuberosity 12/6: NGTD Right glenosphere 12/6: NGTD  MRSA PCR 12/7: negative  Resp 12/9: abundant wbc present, predominantly pmn, rare gram positive cocci in pairs, rare gram negative rods   Anti-infectives (From admission, onward)    Start     Dose/Rate Route Frequency Ordered Stop   11/23/23 1800  cefTRIAXone (ROCEPHIN) 2 g in sodium chloride 0.9 % 100 mL IVPB        2 g 200 mL/hr over 30 Minutes Intravenous Every 24 hours 11/23/23 1711     11/22/23 1400  DAPTOmycin (CUBICIN) IVPB 700 mg/120mL premix        700 mg 200 mL/hr over 30 Minutes Intravenous Every 48 hours 11/21/23 1609     11/22/23 1100  ceFEPIme (MAXIPIME) 2 g in sodium chloride 0.9 % 100 mL IVPB  Status:  Discontinued        2 g 200 mL/hr over 30 Minutes Intravenous Every 24 hours 11/22/23 1009 11/23/23 1711   11/19/23 2245  hydroxychloroquine (PLAQUENIL) tablet 200 mg  Status:  Discontinued        200 mg Per Tube 2 times daily 11/19/23 2152 11/20/23 1041   11/19/23 1800  DAPTOmycin (CUBICIN) IVPB 500 mg/26mL premix  Status:  Discontinued        6 mg/kg  77.1 kg 100 mL/hr over 30 Minutes Intravenous Every 48 hours 11/18/23 1935 11/18/23 1943   11/18/23 2200  hydroxychloroquine (PLAQUENIL) tablet 200 mg  Status:  Discontinued        200 mg Oral 2 times daily 11/18/23 2003 11/19/23 2152   11/18/23 2100  ceFAZolin (ANCEF) IVPB 2g/100 mL premix  Status:  Discontinued        2 g 200 mL/hr over 30 Minutes Intravenous Every 6 hours 11/18/23 2003 11/18/23 2021   11/18/23 2000  DAPTOmycin (CUBICIN) IVPB 500 mg/32mL premix  Status:  Discontinued        6 mg/kg  77.1 kg 100 mL/hr over 30 Minutes Intravenous Every 48 hours 11/18/23 1906 11/18/23 1935   11/18/23 2000  DAPTOmycin (CUBICIN) IVPB 500 mg/64mL premix  Status:  Discontinued        6 mg/kg  77.1 kg 100 mL/hr over 30 Minutes Intravenous Every 48 hours 11/18/23 1943 11/21/23 1609   11/18/23 1930   cefTRIAXone (ROCEPHIN) 2 g in sodium chloride 0.9 % 100 mL IVPB  Status:  Discontinued        2 g 200 mL/hr over 30 Minutes Intravenous Every 24 hours 11/18/23 1832 11/22/23 1009   11/18/23 1602  gentamicin (GARAMYCIN) injection  Status:  Discontinued          As needed 11/18/23 1632 11/18/23 1705   11/18/23 1600  tobramycin (NEBCIN) powder  Status:  Discontinued          As needed 11/18/23 1631 11/18/23 1705   11/18/23 1528  vancomycin (VANCOCIN) powder  Status:  Discontinued          As needed 11/18/23 1628 11/18/23 1705   11/18/23 0600  ceFAZolin (ANCEF) IVPB 2g/100 mL premix        2 g 200 mL/hr over 30 Minutes Intravenous On call to O.R. 11/17/23 2319 11/18/23 1527      Significant Hospital Events: Including procedures, antibiotic start and stop dates in addition to other pertinent events   12/06 admitted for arthroplasty revision explant and insertion of antibiotic device. 12/7 PEA arrest intubated and admitted to the ICU.  Chest x-ray with right pneumothorax status post chest tube placement 12/07.  Status post HD catheter placement 12/07 with worsening kidney function. 12/08 remains intubated and sedated.  12/09 failed SBT due to hypoxia, tolerated PSV during the day 12/10 failed SBT due to low tidal volumes, hypoxia 12/11 Pt remains mechanically intubated on minimal vent settings; performed SBT unable to tolerate PS 5/5 due to hypoxia and low tidal volumes; currently tolerating PS 12/5 11/28/23-patient with RA-ILD with pulmonary fibrosis, currently with AF HR >110 imporved overall.  S/p cardio eval today 11/29/23- patient on 3L/min  resting in bed.  Patient stable from pulmonary perspective. Will peripherally follow and am available if needed   Interim History / Subjective:  As outlined above under significant events   Objective   Blood pressure 123/60, pulse 72, temperature 97.6 F (36.4 C), temperature source Oral, resp. rate 16, height 5\' 3"  (1.6 m), weight 75.9 kg, SpO2  99%.        Intake/Output Summary (Last 24 hours) at 11/29/2023 0756 Last data filed at 11/29/2023 0600 Gross per 24 hour  Intake 1446.74 ml  Output 845 ml  Net 601.74 ml   Filed Weights   11/27/23 0552 11/28/23 0400 11/29/23 0449  Weight: 73.6 kg 73.1 kg 75.9 kg    Examination: General: Acutely-ill appearing female, NAD mechanically intubated  HENT: Supple, no JVD  Lungs: decreased bs bilaterally Cardiovascular: NSR, s1s2, no r/g, 2+ radial/2+ distal pulses, no edema  Abdomen: +BS x4, soft, non tender, non distended  Extremities:RUE ROM limited due to recent surgical procedure  Skin: Right shoulder incision site with honeycomb dressing clear/dry/intact  Neuro: Awake, following commands, PERRL  GU:  Indwelling foley catheter draining yellow urine   Assessment & Plan:   72 year old female presented on 12/06 for right reverse shoulder arthroplasty due to suspected septic joint complicated by post operative cardiac arrest (PEA).  #1 Toxic Metabolic Encephalopathy- RESOLVED   #2 PEA Arrest- RESOLVED #3 Afib (new onset) Echo 11/20/23: EF 55 to 60%; trivial mitral valve regurgitation - Continuous telemetry monitoring  - Continue amiodarone and heparin gtts  - Continue aspirin   #4 Acute Hypoxic Respiratory Failure ACUTE EXACERBATION OF ILD - per CT chest imaging    #5 Anemia in stage 4 CKD #6 Iron deficiency anemia  - Trend CBC  - Monitor for s/sx of bleeding - Iron and TIBC results pending  - Transfuse for hgb less than 7 - Continue outpatient ferrous sulfate   #Gastrointestinal - Continues tube feeds, famotidine for SUP  #AKI superimposed on stage 4 CKD - Trend BMP  - Replace electrolytes as indicated  - Strict intake/output  - Avoid nephrotoxic medications   #R. Reverse shoulder arthroplasty (9/17) #Revision with converstion to long stemp humeroal component for periprosthetic fracture (10/21) #Right reverse shoulder arthroplasty explant of glenoid and  humeral components for infection (12/6) #HAP - Trend WBC and monitor fever curve  - Trend PCT  - Follow cultures  - ID consulted appreciate input - Continue abx as outlined above pending culture results and sensitivities  - Orthopedic consulted appreciate input   Endocrine - CBG's q4hrs  - Follow hyper/hypoglycemic protocol  - Target range 140 to 180  Best Practice (right click and "Reselect all SmartList Selections" daily)   Diet/type: tubefeeds DVT prophylaxis systemic heparin Pressure ulcer(s): N/A GI prophylaxis: H2B Lines: Central line and yes and it is still needed Foley:  Yes, and it is still needed Code Status:  full code Last date of multidisciplinary goals of care discussion [11/23/2023]  12/11: Updated pts husband at bedside regarding pts condition and current plan of care  Labs   CBC: Recent Labs  Lab 11/25/23 0152 11/26/23 0434 11/27/23 0322 11/28/23 0430 11/29/23 0429  WBC 11.0* 10.8* 12.6* 14.1* 15.5*  HGB 8.6* 8.7* 7.9* 8.3* 7.8*  HCT 28.3* 28.7* 26.4* 26.8* 25.1*  MCV 93.7 91.1 92.6 89.9 91.6  PLT 274 344 374 413* 419*    Basic Metabolic Panel: Recent Labs  Lab 11/23/23 0544 11/24/23 0448 11/25/23 0152 11/26/23 0434 11/27/23 0322 11/28/23 0430 11/29/23 0429  NA 136 137 137 143 144 145 149*  K 3.9 4.5 4.6 4.6 4.3 3.9 4.3  CL 101 104 103 107 106 107 111  CO2 22 25 20* 22 24 25 23   GLUCOSE 155* 146* 92 92 119* 111* 110*  BUN 76* 84* 90* 83* 83* 78* 81*  CREATININE 2.88* 2.61* 2.74* 2.58* 2.52* 2.52* 2.80*  CALCIUM 8.6* 8.5* 8.9 9.7 9.2 9.7 9.6  MG 2.0 2.1 2.1 2.4  --  2.5*  --   PHOS 4.1 3.4 5.0* 4.8* 5.0* 4.7* 5.0*   GFR: Estimated Creatinine Clearance: 18.2 mL/min (A) (by C-G formula based on SCr of 2.8 mg/dL (H)). Recent Labs  Lab 11/26/23 0434 11/27/23 0322 11/28/23 0430 11/29/23 0429  WBC 10.8* 12.6* 14.1* 15.5*    Liver Function Tests: Recent Labs  Lab 11/26/23 0434 11/27/23 0322 11/28/23 0430 11/29/23 0429  ALBUMIN  2.4* 2.3* 2.7* 2.6*   No results for input(s): "LIPASE", "AMYLASE" in the last 168 hours. No results for input(s): "AMMONIA" in the last 168 hours.  ABG    Component Value Date/Time  PHART 7.36 11/26/2023 0558   PCO2ART 40 11/26/2023 0558   PO2ART 120 (H) 11/26/2023 0558   HCO3 22.1 11/26/2023 1004   ACIDBASEDEF 3.7 (H) 11/26/2023 1004   O2SAT 96.3 11/26/2023 1004     Coagulation Profile: Recent Labs  Lab 11/22/23 1247  INR 1.3*    Cardiac Enzymes: Recent Labs  Lab 11/23/23 0544  CKTOTAL 35*    HbA1C: Hgb A1c MFr Bld  Date/Time Value Ref Range Status  11/26/2023 04:34 AM 5.4 4.8 - 5.6 % Final    Comment:    (NOTE) Pre diabetes:          5.7%-6.4%  Diabetes:              >6.4%  Glycemic control for   <7.0% adults with diabetes     CBG: Recent Labs  Lab 11/28/23 0720 11/28/23 1109 11/28/23 1647 11/28/23 2343 11/29/23 0617  GLUCAP 98 113* 103* 126* 91    Past Medical History:  She,  has a past medical history of Acute hypoxemic respiratory failure (HCC), Anemia in stage 4 chronic kidney disease (HCC), Anginal pain (HCC), Anxiety, Aortic atherosclerosis (HCC), CHF (congestive heart failure) (HCC), Chronic pain, Chronic radicular pain of lower back, CKD (chronic kidney disease), stage IV (HCC), Complication of anesthesia, Coronary artery calcification seen on CT scan, Costochondritis, DDD (degenerative disc disease), lumbar, Depression, Dyspnea, GERD (gastroesophageal reflux disease), Hiatal hernia, Hip dysplasia, Hyperlipidemia, Hypertension, Insomnia, Iron deficiency, Iron deficiency anemia, Low back pain, Low vitamin B12 level, Lumbar facet joint pain, Migraines, Nose colonized with MRSA (10/03/2020), Numbness and tingling of right leg, OA (osteoarthritis), Obesity, OSA (obstructive sleep apnea), Osteoporosis, Pelvic fracture (HCC), Pneumonia, Pulmonary fibrosis (HCC), Raynaud's disease without gangrene, Restless leg syndrome, Rheumatoid arthritis (HCC), Right  renal mass (05/28/2023), Seasonal allergies, and Thoracic compression fracture (HCC).   Surgical History:   Past Surgical History:  Procedure Laterality Date   ABDOMINAL SURGERY     pt denies   APPENDECTOMY     CARPAL TUNNEL RELEASE Bilateral    CHOLECYSTECTOMY     COLONOSCOPY WITH PROPOFOL N/A 08/15/2020   Procedure: COLONOSCOPY WITH PROPOFOL;  Surgeon: Earline Mayotte, MD;  Location: [facility];  Service: Endoscopy;  Laterality: N/A;   CYSTOSCOPY W/ RETROGRADES Right 07/18/2023   Procedure: CYSTOSCOPY WITH RETROGRADE PYELOGRAM;  Surgeon: [name], MD;  Location: [facility] OR[facility];  Service: Urology;  Laterality: Right;   DILATION AND CURETTAGE OF UTERUS     ESOPHAGOGASTRODUODENOSCOPY (EGD) WITH PROPOFOL N/A 08/15/2020   Procedure: ESOPHAGOGASTRODUODENOSCOPY (EGD) WITH PROPOFOL;  Surgeon: Earline Mayotte, MD;  Location: [facility];  Service: Endoscopy;  Laterality: N/A;   FRACTURE SURGERY     hip fracture    FRACTURE SURGERY     pelvic fracture plate    HIP SURGERY Left    KNEE ARTHROPLASTY Right 10/13/2020   Procedure: COMPUTER ASSISTED TOTAL KNEE ARTHROPLASTY - RNFA;  Surgeon: Donato Heinz, MD;  Location: [facility];  Service: Orthopedics;  Laterality: Right;   REVERSE SHOULDER ARTHROPLASTY Right 08/30/2023   Procedure: Right reverse shoulder arthroplasty, biceps tenodesis;  Surgeon: Signa Kell, MD;  Location: [facility];  Service: Orthopedics;  Laterality: Right;   TOTAL SHOULDER REVISION Right 10/03/2023   Procedure: Revision right reverse shoulder arthroplasty with conversion to long humeral stem and open reduction internal fixation of the humerus;  Surgeon: Signa Kell, MD;  Location: [facility];  Service: Orthopedics;  Laterality: Right;   TOTAL SHOULDER REVISION Right 11/18/2023   Procedure: Right revision reverse shoulder arthroplasty (conversion  to longstem and glenosphere exchange);  Surgeon: Signa Kell, MD;  Location: [facility];  Service: Orthopedics;  Laterality:  Right;   TUBAL LIGATION     URETEROSCOPY  07/18/2023   Procedure: DIAGNOSTIC URETEROSCOPY;  Surgeon: [name], MD;  Location: [facility];  Service: Urology;;     Social History:   reports that she has never smoked. She has never used smokeless tobacco. She reports that she does not drink alcohol and does not use drugs.   Family History:  Her family history includes Aneurysm in her father; Cancer in her brother; Diabetes in her brother, maternal grandfather, mother, and son; Heart disease in her maternal grandfather; Hypertension in her mother; Osteosarcoma in her brother; Seizures in her son.   Allergies Allergies  Allergen Reactions   Gabapentin Other (See Comments)    Weight gain   Ibuprofen Other (See Comments)    Headache     Home Medications  Prior to Admission medications   Medication Sig Start Date End Date Taking? Authorizing Provider  acetaminophen (TYLENOL) 500 MG tablet Take 2 tablets (1,000 mg total) by mouth every 6 (six) hours as needed. 08/31/23  Yes Anson Oregon, PA-C  aspirin EC 325 MG tablet Take 1 tablet (325 mg total) by mouth daily. 08/31/23  Yes Anson Oregon, PA-C  calcitRIOL (ROCALTROL) 0.25 MCG capsule Take 0.25 mcg by mouth every morning. 05/22/21  Yes [provider]  Calcium Carb-Cholecalciferol 600-400 MG-UNIT CAPS Take 1 capsule by mouth 3 (three) times daily. 01/13/10  Yes [provider]  chlorhexidine (HIBICLENS) 4 % external liquid Apply 15 mLs (1 Application total) topically as directed for 30 doses. Use as directed daily for 5 days every other week for 6 weeks. 11/18/23  Yes Signa Kell, MD  Cholecalciferol 25 MCG (1000 UT) tablet Take 1,000 Units by mouth every morning. 09/23/09  Yes [provider]  cyanocobalamin 1000 MCG tablet Take 1,000 mcg by mouth every morning.   Yes [provider]  cyclobenzaprine (FLEXERIL) 10 MG tablet Take 10 mg by mouth at bedtime as needed for muscle spasms (Leg cramps).    Yes [provider]  enalapril-hydrochlorothiazide (VASERETIC) 10-25 MG tablet Take 1 tablet by mouth at bedtime. 09/12/23  Yes Hammock, Lavonna Rua, NP  ferrous sulfate 325 (65 FE) MG tablet Take 325 mg by mouth daily with breakfast.   Yes [provider]  furosemide (LASIX) 40 MG tablet Take 1.5 tablets (60 mg total) by mouth every morning. Increase to 1.5 tablet (60 mg total) by mouth in morning and extra 1 tablet (40 mg total for maximum daily dose 100 mg) at lunch time as needed for up to 3 days for increased leg swelling, shortness of breath, weight gain 5+ lbs over 1-2 days. Seek medical care if these symptoms are not improving with increased dose. 09/02/23  Yes Sunnie Nielsen, DO  hydroxychloroquine (PLAQUENIL) 200 MG tablet Take 200 mg by mouth 2 (two) times daily. 06/22/21  Yes [provider]  mirtazapine (REMERON) 30 MG tablet Take 30 mg by mouth at bedtime. 06/29/22  Yes [provider]  Multiple Vitamins-Minerals (MULTIVITAMIN ADULT PO) Take 1 tablet by mouth every morning. 09/10/08  Yes [provider]  mupirocin ointment (BACTROBAN) 2 % Apply small about inside of both nostrils TWICE a day for the next 5 days. 08/25/23  Yes Verlee Monte, NP  mupirocin ointment (BACTROBAN) 2 % Place 1 Application into the nose 2 (two) times daily for 60 doses. Use as directed  2 times daily for 5 days every other week for 6 weeks. 11/18/23 12/18/23 Yes Signa Kell, MD  omeprazole (PRILOSEC) 40 MG capsule Take 40 mg by mouth every morning.   Yes [provider]  ondansetron (ZOFRAN) 4 MG tablet Take 1 tablet (4 mg total) by mouth every 6 (six) hours as needed for nausea. 08/31/23  Yes Anson Oregon, PA-C  oxyCODONE (OXY IR/ROXICODONE) 5 MG immediate release tablet Take 1 tablet (5 mg total) by mouth every 4 (four) hours as needed for moderate pain (pain score 4-6) (pain score 4-6). 10/04/23  Yes Dedra Skeens, PA-C  Pirfenidone 267 MG TABS Take 801 tablets by  mouth 3 (three) times daily. 12/24/22  Yes [provider]  pramipexole (MIRAPEX) 0.125 MG tablet Take 0.125-0.25 mg by mouth See admin instructions. Take 0.125 mg in the morning and 0.25 mg  at bedtime 02/15/18  Yes [provider]  PROLIA 60 MG/ML SOSY injection Inject 60 mg into the skin every 6 (six) months. 04/06/23  Yes [provider]  rosuvastatin (CRESTOR) 5 MG tablet Take 5 mg by mouth every other day. 07/18/19  Yes [provider]  spironolactone (ALDACTONE) 25 MG tablet Take 25 mg by mouth at bedtime. 04/09/21  Yes [provider]  tetrahydrozoline 0.05 % ophthalmic solution Place 1 drop into both eyes daily as needed (allergies).   Yes [provider]  venlafaxine XR (EFFEXOR-XR) 150 MG 24 hr capsule Take 150 mg by mouth See admin instructions. Take with 75 mg for total 225 mg in the morning 12/28/22 12/28/23 Yes [provider]  venlafaxine XR (EFFEXOR-XR) 75 MG 24 hr capsule Take 75 mg by mouth See admin instructions. Take with 150 mg for a total of 225 mg in the morning 08/24/23  Yes [provider]  chlorhexidine (HIBICLENS) 4 % external liquid Apply 15 mLs (1 Application total) topically as directed for 30 doses. Use as directed daily for 5 days every other week for 6 weeks. Patient not taking: Reported on 11/17/2023 10/03/23   Signa Kell, MD      Vida Rigger, M.D.  Pulmonary & Critical Care Medicine  [facility]

## 2023-11-29 NOTE — Consult Note (Signed)
PHARMACY - ANTICOAGULATION CONSULT NOTE  Pharmacy Consult for IV Heparin Indication: atrial fibrillation  Patient Measurements: Height: 5\' 3"  (160 cm) Weight: 75.9 kg (167 lb 5.3 oz) IBW/kg (Calculated) : 52.4 Heparin Dosing Weight: 70 kg  Labs: Recent Labs    11/27/23 0322 11/27/23 0335 11/28/23 0430 11/29/23 0429 11/29/23 1938  HGB 7.9*  --  8.3* 7.8*  --   HCT 26.4*  --  26.8* 25.1*  --   PLT 374  --  413* 419*  --   HEPARINUNFRC  --    < > 0.34 0.19* 0.57  CREATININE 2.52*  --  2.52* 2.80*  --    < > = values in this interval not displayed.   Estimated Creatinine Clearance: 18.2 mL/min (A) (by C-G formula based on SCr of 2.8 mg/dL (H)).  Medical History: Past Medical History:  Diagnosis Date   Acute hypoxemic respiratory failure (HCC)    Anemia in stage 4 chronic kidney disease (HCC)    Anginal pain (HCC)    Anxiety    Aortic atherosclerosis (HCC)    CHF (congestive heart failure) (HCC)    a.) TTE 09/30/2021: EF 55-60%, no RWMAs, mild MR, G1DD   Chronic pain    Chronic radicular pain of lower back    CKD (chronic kidney disease), stage IV (HCC)    Complication of anesthesia    a.) delayed emergence following ureteroscopy 07/2023   Coronary artery calcification seen on CT scan    Costochondritis    DDD (degenerative disc disease), lumbar    Depression    Dyspnea    GERD (gastroesophageal reflux disease)    Hiatal hernia    Hip dysplasia    Hyperlipidemia    Hypertension    Insomnia    Iron deficiency    Iron deficiency anemia    Low back pain    Low vitamin B12 level    Lumbar facet joint pain    Migraines    Nose colonized with MRSA 10/03/2020   a.) preop PCR (+) 10/03/2020 prior to RIGHT TKA; b.) preop PCR (+) 08/25/2023 prior to RIGHT REVERSE SHOULDER ARTHROPLASTY; BICEPS TENODESIS   Numbness and tingling of right leg    OA (osteoarthritis)    Obesity    OSA (obstructive sleep apnea)    a.) not currently utilizing nocturnal PAP therapy;  positional. PCCM feels as if symptom can be mitigated with diet/exercise/weight loss unless develops significant symptoms.   Osteoporosis    a.) recieves denosumab injections   Pelvic fracture (HCC)    Pneumonia    Pulmonary fibrosis (HCC)    Raynaud's disease without gangrene    Restless leg syndrome    a.) on pramipexole + oral Fe supplementation   Rheumatoid arthritis (HCC)    a.) Tx'd with hydroxychloroquine   Right renal mass 05/28/2023   a.) CT renal 05/28/2023:  5.7 cm mass in the medial aspect of right kidney   Seasonal allergies    Thoracic compression fracture (HCC)    Medications:  No anticoagulation prior to admission per my chart review  Assessment: 72 y/o F with a past medical history of CKD IV, HFpEF, hypertension, RA, OSA not on CPAP who came to [facility] 12/6 for right reverse shoulder arthroplasty after for suspicion of septic joint. Patient experienced cardiac arrest post-operatively on 12/7. She is intubated, sedated and on mechanical ventilation in the ICU. Hospital course now further complicated by paroxysmal Afib with RVR. Patient has been placed on amiodarone. Pharmacy consulted for heparin.  Baseline aPTT and PT-INR are pending. CBC is notable for anemia which is chronic.  12/10 20:29 HL <0.10 12/11 0544 HL 0.13, subtherapeutic 12/11 1539 HL 0.29, subtherapeutic 12/12 0039 HL 0.34, therapeutic x 1 12/12 0920 HL 0.28, subtherapeutic 12/12 1817 HL 0.42, therapeutic x 1 12/13 0152 HL 0.39, therapeutic x 2 12/14 0434 HL 0.42, therapeutic x 3 12/15 0322 HL 0.31, therapeutic x 4 12/16 0430 HL 0.34, therapeutic x 5 12/17 0429 HL 0.19, SUBtherapeutic  12/17 1938 HL 0.57, therapeutic x 1  Goal of Therapy:  Heparin level 0.3-0.7 units/ml Monitor platelets by anticoagulation protocol: Yes   Plan:  12/17 1938 HL 0.57, therapeutic x 1 Continue current heparin infusion rate of 1850 units/hr Will recheck HL in 8 hrs  Monitor CBC daily  Merryl Hacker,  PharmD Clinical Pharmacist 11/29/2023 8:10 PM

## 2023-11-29 NOTE — Progress Notes (Signed)
[facility]  ROUNDING NOTE   Subjective:   Tonya Cummings is a 72  y.o. female with past medical conditions including chronic kidney disease stage IV, proteinuria, hypertension, anemia of chronic kidney disease, RA, OSA and secondary hyperparathyroidism. Patient presented to the hospital for planned right shoulder arthroplasty which was later complicated by PEA cardiac arrest.  Patient is known to our practice and is followed outpatient by Dr. Cherylann Ratel.  Patient is seen and evaluated at bedside in ICU.    Resting quietly when seen earlier today. IV heparin, amiodarone infusing. Also getting maintenance IV fluids at 50 cc/h Patient currently has a Foley. She is requiring [name] O2 supplementation.  Objective:  Vital signs in last 24 hours:  Temp:  [97.6 F (36.4 C)-98.6 F (37 C)] 97.8 F (36.6 C) (12/17 1629) Pulse Rate:  [51-150] 73 (12/17 1629) Resp:  [12-32] 16 (12/17 1629) BP: (97-165)/(52-130) 133/73 (12/17 1629) SpO2:  [89 %-100 %] 89 % (12/17 1629) Weight:  [75.9 kg] 75.9 kg (12/17 0449)  Weight change: 2.8 kg Filed Weights   11/27/23 0552 11/28/23 0400 11/29/23 0449  Weight: 73.6 kg 73.1 kg 75.9 kg    Intake/Output: I/O last 3 completed shifts: In: 1844.5 [I.V.:1644.3; IV Piggyback:200.2] Out: 1605 [Urine:1605]   Intake/Output this shift:  Total I/O In: 789.1 [I.V.:789.1] Out: 350 [Urine:350]  Physical Exam: General: Ill appearing, laying in bed  Head: Normocephalic, atraumatic. Moist oral mucosal membranes  Eyes: Anicteric  Lungs:  Coarse breath sounds, [place] O2  Heart: irregular, atrial fibrillation/flutter  Abdomen:  Soft, nontender  Extremities:  No peripheral edema.  Neurologic: Resting quietly.  Skin: No lesions       Basic Metabolic Panel: Recent Labs  Lab 11/23/23 0544 11/24/23 0448 11/25/23 0152 11/26/23 0434 11/27/23 0322 11/28/23 0430 11/29/23 0429  NA 136 137 137 143 144 145 149*  K 3.9 4.5 4.6 4.6 4.3 3.9 4.3  CL 101 104  103 107 106 107 111  CO2 22 25 20* 22 24 25 23   GLUCOSE 155* 146* 92 92 119* 111* 110*  BUN 76* 84* 90* 83* 83* 78* 81*  CREATININE 2.88* 2.61* 2.74* 2.58* 2.52* 2.52* 2.80*  CALCIUM 8.6* 8.5* 8.9 9.7 9.2 9.7 9.6  MG 2.0 2.1 2.1 2.4  --  2.5*  --   PHOS 4.1 3.4 5.0* 4.8* 5.0* 4.7* 5.0*    Liver Function Tests: Recent Labs  Lab 11/26/23 0434 11/27/23 0322 11/28/23 0430 11/29/23 0429  ALBUMIN 2.4* 2.3* 2.7* 2.6*   No results for input(s): "LIPASE", "AMYLASE" in the last 168 hours. No results for input(s): "AMMONIA" in the last 168 hours.  CBC: Recent Labs  Lab 11/25/23 0152 11/26/23 0434 11/27/23 0322 11/28/23 0430 11/29/23 0429  WBC 11.0* 10.8* 12.6* 14.1* 15.5*  HGB 8.6* 8.7* 7.9* 8.3* 7.8*  HCT 28.3* 28.7* 26.4* 26.8* 25.1*  MCV 93.7 91.1 92.6 89.9 91.6  PLT 274 344 374 413* 419*    Cardiac Enzymes: Recent Labs  Lab 11/23/23 0544  CKTOTAL 35*    BNP: Invalid input(s): "POCBNP"  CBG: Recent Labs  Lab 11/28/23 1109 11/28/23 1647 11/28/23 2343 11/29/23 0617 11/29/23 1333  GLUCAP 113* 103* 126* 91 94    Microbiology: Results for orders placed or performed during the hospital encounter of 11/18/23  Surgical pcr screen     Status: Abnormal   Collection Time: 11/18/23 12:11 PM   Specimen: Nasal Mucosa; Nasal Swab  Result Value Ref Range Status   MRSA, PCR NEGATIVE NEGATIVE Final  Staphylococcus aureus POSITIVE (A) NEGATIVE Final    Comment: (NOTE) The Xpert SA Assay (FDA approved for NASAL specimens in patients 28 years of age and older), is one component of a comprehensive surveillance program. It is not intended to diagnose infection nor to guide or monitor treatment. Performed at [facility], [address] [place], [place] [address]   Aerobic/Anaerobic Culture w Gram Stain (surgical/deep wound)     Status: Abnormal (Preliminary result)   Collection Time: 11/18/23  2:29 PM   Specimen: Path Tissue  Result Value Ref Range Status    Specimen Description   Final    TISSUE Performed at [facility], [address] [place], [place] [address]    Special Requests RT HUMERUS HOLD 21DAYS  Final   Gram Stain   Final    RARE WBC PRESENT,BOTH PMN AND MONONUCLEAR NO ORGANISMS SEEN    Culture (A)  Final    CANDIDA ALBICANS CONTINUING TO HOLD Performed at [facility], [address] [place], [place] [address]    Report Status PENDING  Incomplete  Aerobic/Anaerobic Culture w Gram Stain (surgical/deep wound)     Status: None (Preliminary result)   Collection Time: 11/18/23  2:43 PM   Specimen: Path Tissue  Result Value Ref Range Status   Specimen Description   Final    TISSUE Performed at [facility], [address] [place], [place] [address]    Special Requests TUBEROSITY,HOLD 21DAYS  Final   Gram Stain   Final    FEW WBC PRESENT,BOTH PMN AND MONONUCLEAR NO ORGANISMS SEEN    Culture   Final    NO GROWTH 11 DAYS CONTINUING TO HOLD Performed at [facility], [address], [place], [place] [address]    Report Status PENDING  Incomplete  Aerobic/Anaerobic Culture w Gram Stain (surgical/deep wound)     Status: Abnormal (Preliminary result)   Collection Time: 11/18/23  2:52 PM   Specimen: Path Tissue  Result Value Ref Range Status   Specimen Description   Final    TISSUE Performed at [facility], [address] [place], [place] [address]    Special Requests RT HUMERAL TRAY HOLD 21 DAYS  Final   Gram Stain   Final    RARE WBC PRESENT, PREDOMINANTLY MONONUCLEAR NO ORGANISMS SEEN    Culture (A)  Final    CANDIDA ALBICANS CONTINUING TO HOLD Performed at [facility], [address], [place], [place] [address]    Report Status PENDING  Incomplete  Aerobic/Anaerobic Culture w Gram Stain (surgical/deep wound)     Status: None (Preliminary result)   Collection Time: 11/18/23  2:58 PM   Specimen: Path Tissue  Result Value Ref Range Status   Specimen Description    Final    TISSUE Performed at [facility], [address] [place], [place] [address]    Special Requests GLENOSPHERE,HOLD 21 DAYS  Final   Gram Stain   Final    RARE WBC PRESENT,BOTH PMN AND MONONUCLEAR NO ORGANISMS SEEN    Culture   Final    NO GROWTH 11 DAYS CONTINUING TO HOLD Performed at [facility], [address] [place], [place] [address]    Report Status PENDING  Incomplete  Aerobic/Anaerobic Culture w Gram Stain (surgical/deep wound)     Status: None (Preliminary result)   Collection Time: 11/18/23  3:06 PM   Specimen: Path Tissue  Result Value Ref Range Status   Specimen  Description   Final    TISSUE Performed at [facility], [address] [place], [place] [address]    Special Requests RT HUMEREUS CANAL HOLD 21DAYS  Final   Gram Stain   Final    FEW WBC PRESENT, PREDOMINANTLY MONONUCLEAR NO ORGANISMS SEEN    Culture   Final    NO GROWTH 11 DAYS CONTINUING TO HOLD Performed at [facility], [address], [place], [place] [address]    Report Status PENDING  Incomplete  Culture, blood (Routine X 2) w Reflex to ID Panel     Status: None   Collection Time: 11/18/23  7:22 PM   Specimen: BLOOD  Result Value Ref Range Status   Specimen Description BLOOD BLOOD LEFT HAND  Final   Special Requests   Final    BOTTLES DRAWN AEROBIC AND ANAEROBIC Blood Culture adequate volume   Culture   Final    NO GROWTH 5 DAYS Performed at [facility], [address], [place], [place] [address]    Report Status 11/23/2023 FINAL  Final  Culture, blood (Routine X 2) w Reflex to ID Panel     Status: None   Collection Time: 11/18/23  7:28 PM   Specimen: BLOOD  Result Value Ref Range Status   Specimen Description BLOOD BLOOD LEFT ARM  Final   Special Requests   Final    BOTTLES DRAWN AEROBIC AND ANAEROBIC Blood Culture adequate volume   Culture   Final    NO GROWTH 5 DAYS Performed at [facility], [address]  [place], [place] [address]    Report Status 11/23/2023 FINAL  Final  MRSA Next Gen by PCR, Nasal     Status: None   Collection Time: 11/19/23  1:57 PM   Specimen: Nasal Mucosa; Nasal Swab  Result Value Ref Range Status   MRSA by PCR Next Gen NOT DETECTED NOT DETECTED Final    Comment: (NOTE) The GeneXpert MRSA Assay (FDA approved for NASAL specimens only), is one component of a comprehensive MRSA colonization surveillance program. It is not intended to diagnose MRSA infection nor to guide or monitor treatment for MRSA infections. Test performance is not FDA approved in patients less than 29 years old. Performed at [facility], [address] [place], [place] [address]   Culture, Respiratory w Gram Stain     Status: None   Collection Time: 11/21/23  3:18 PM   Specimen: Tracheal Aspirate; Respiratory  Result Value Ref Range Status   Specimen Description   Final    TRACHEAL ASPIRATE Performed at [facility], [address] [place], [place] [address]    Special Requests   Final    NONE Performed at [facility], [address] [place], [place] [address]    Gram Stain   Final    ABUNDANT WBC PRESENT, PREDOMINANTLY PMN RARE GRAM POSITIVE COCCI IN PAIRS RARE GRAM NEGATIVE RODS    Culture   Final    FEW Normal respiratory flora-no Staph aureus or Pseudomonas seen Performed at [facility], [address], [place], [place] [address]    Report Status 11/23/2023 FINAL  Final    Coagulation Studies: No results for input(s): "LABPROT", "INR" in the last 72 hours.   Urinalysis: No results for input(s): "COLORURINE", "LABSPEC", "PHURINE", "GLUCOSEU", "HGBUR", "BILIRUBINUR", "KETONESUR", "PROTEINUR", "UROBILINOGEN", "NITRITE", "LEUKOCYTESUR" in the last 72 hours.  Invalid input(s): "APPERANCEUR"    Imaging: No results found.    Medications:  sodium chloride 50 mL/hr at 11/29/23 1504   amiodarone 30 mg/hr (11/29/23 1537)   cefTRIAXone  (ROCEPHIN)  IV Stopped (11/28/23 1822)   DAPTOmycin Stopped (11/28/23 1428)   heparin 1,850 Units/hr (11/29/23 1504)    aspirin  325 mg Oral Daily   Chlorhexidine Gluconate Cloth  6 each Topical Daily   lidocaine  1 patch Transdermal Q24H   metoprolol tartrate  25 mg Oral BID   mupirocin ointment   Topical BID   pramipexole  0.125 mg Oral q morning   And   pramipexole  0.25 mg Oral QHS   sodium chloride flush  3 mL Intravenous Q12H   sodium chloride flush  3 mL Intravenous Q12H   bisacodyl, ipratropium-albuterol, metoprolol tartrate, morphine injection, mouth rinse, mouth rinse, polyethylene glycol, senna-docusate  Assessment/ Plan:  [name] is a 72 y.o.  female with past medical conditions including chronic kidney disease stage IV, proteinuria, hypertension, anemia of chronic kidney disease, RA, OSA and secondary hyperparathyroidism. Patient presented to the hospital for planned right shoulder arthroplasty which was later complicated by PEA cardiac arrest.  She is currently admitted for Periprosthetic fracture around internal prosthetic right shoulder joint [M97.31XA] Septic arthritis (HCC) [M00.9]   Acute Kidney Injury on chronic kidney disease stage 4 with baseline creatinine 2.08 on 10/04/2023.  Acute kidney injury secondary to ATN from hypoperfusion.  Chronic kidney disease secondary to hypertension and rheumatoid arthritis with prior NSAID use.  No IV contrast exposure.    -Serum creatinine slightly worse today.  Electrolytes and volume status are acceptable.  No acute indication for dialysis at present.  Urine output recorded at 845 cc yesterday.   Lab Results  Component Value Date   CREATININE 2.80 (H) 11/29/2023   CREATININE 2.52 (H) 11/28/2023   CREATININE 2.52 (H) 11/27/2023    Intake/Output Summary (Last 24 hours) at 11/29/2023 1631 Last data filed at 11/29/2023 1504 Gross per 24 hour  Intake 1832.89 ml  Output 830 ml  Net 1002.89 ml   2. Anemia of  chronic kidney disease with acute blood loss Lab Results  Component Value Date   HGB 7.8 (L) 11/29/2023  Patient has received blood transfusions during this admission.   3. Secondary Hyperparathyroidism: with outpatient labs: PTH 45, phosphorus 4.7, calcium 9.4 on 11/07/23.   Lab Results  Component Value Date   CALCIUM 9.6 11/29/2023   PHOS 5.0 (H) 11/29/2023    Will continue to monitor bone minerals during this admission.  Continue calcitriol.  4.  Right reverse shoulder arthroplasty loosening after periprosthetic fracture with a right periprosthetic infection and humerus fractures.  Patient underwent right reverse shoulder arthroplasty explant of glenoid and humeral components and open treatment of right humerus fracture with intramedullary device.  This was done on 11/18/2023. Currently receiving IV ceftriaxone and IV daptomycin.     LOS: 11 Adryanna Friedt 12/17/20244:31 PM

## 2023-11-29 NOTE — Progress Notes (Signed)
Occupational Therapy Treatment Patient Details Name: Tonya Cummings MRN: [id number] DOB: December 29, 1950 Today's Date: 11/29/2023   History of present illness Pt is a 72 y.o. female s/p 11/18/23 R reverse shoulder arthroplasty explant of glenoid and humeral components, open treatment of R humerus fx with intramedullary device, and R humerus insertion of antibiotic deliver device.  11/19/23 pt with PEA arrest (intubated and admitted to ICU); pt with R pneumothorax s/p chest tube placement 12/7; s/p HD catheter placement 12/7 d/t worsening kidney function.  Extubated 11/24/23.  PMH includes SOB, sleep apnea, htn, CAD, CHF, hiatal hernia, CKD stage 4, HFpEF, B12 deficiency, anemia, RA, Raynaud's phenomenon, OSA, L hip sx, R TKA, R reverse shoulder arthroplasty (s/p revision).   OT comments  Tonya Cummings was seen for OT treatment on this date. Upon arrival to room pt in bed with spouse at bed side, agreeable to tx. Per RN pt agitated overnight receiving haldol and now with B mitts donned. Mitts removed for session then replaced, mild B hand edema noted. Pt alerts to light and touch, responds to spouses voice and repeatedly states she wants to go home. Follows commands with time.  Pt requires MAX A hand over hand oral care using nondominant L hand. Tolerated PROM to elbow/wrist/hand and follows commands for active digit flexion/extension. (Per op note avoid shoulder PROM until after 2 week follow up). Educated spouse on HEP including IS, flutter valve, and ROM exercises. MAX A to readjust position in bed, will require +2 for EOB when pt able to tolerate. Pt making limited progress toward goals, will continue to follow POC. Discharge recommendation remains appropriate.        If plan is discharge home, recommend the following:  A lot of help with bathing/dressing/bathroom;Assistance with cooking/housework;Assistance with feeding;Help with stairs or ramp for entrance;Assist for transportation;Two people to  help with walking and/or transfers   Equipment Recommendations  None recommended by OT    Recommendations for Other Services      Precautions / Restrictions Precautions Precautions: Shoulder Precaution Comments: L IJ temporary HD catheter; sling on OOB Restrictions Weight Bearing Restrictions Per Provider Order: Yes RUE Weight Bearing Per Provider Order: Non weight bearing       Mobility Bed Mobility Overal bed mobility: Needs Assistance             General bed mobility comments: MAX A to adjust position in bed    Transfers                   General transfer comment: unsafe to attempt         ADL either performed or assessed with clinical judgement   ADL Overall ADL's : Needs assistance/impaired                                       General ADL Comments: MAX A hand over hand oral care using nondominant L hand.       Cognition Arousal: Lethargic Behavior During Therapy: Restless Overall Cognitive Status: Impaired/Different from baseline Area of Impairment: Following commands, Safety/judgement, Awareness                       Following Commands: Follows one step commands inconsistently Safety/Judgement: Decreased awareness of safety Awareness: Intellectual   General Comments: responds to spouses voice with minimal words and yes/no nods. Repeatedly states she wants to go home.  Exercises Exercises: Other exercises Other Exercises Other Exercises: tolerated PROM to elbow/wrist/hand and follows commands for active digit flexion/extension. (Per op note avoid shoulder PROM until after 2 week follow up)            Pertinent Vitals/ Pain       Pain Assessment Pain Assessment: PAINAD Breathing: occasional labored breathing, short period of hyperventilation Negative Vocalization: occasional moan/groan, low speech, negative/disapproving quality Facial Expression: sad, frightened, frown Body Language: tense,  distressed pacing, fidgeting Consolability: unable to console, distract or reassure PAINAD Score: 6 Pain Location: not localized Pain Descriptors / Indicators: Discomfort Pain Intervention(s): Limited activity within patient's tolerance, Repositioned   Frequency  Min 1X/week        Progress Toward Goals  OT Goals(current goals can now be found in the care plan section)  Progress towards OT goals: Progressing toward goals  Acute Rehab OT Goals OT Goal Formulation: With patient Time For Goal Achievement: 12/09/23 Potential to Achieve Goals: Good ADL Goals Pt Will Perform Grooming: sitting;with mod assist Pt Will Perform Lower Body Dressing: with mod assist;sitting/lateral leans Pt Will Transfer to Toilet: bedside commode;with min assist;squat pivot transfer   AM-PAC OT "6 Clicks" Daily Activity     Outcome Measure   Help from another person eating meals?: A Little Help from another person taking care of personal grooming?: A Lot Help from another person toileting, which includes using toliet, bedpan, or urinal?: A Lot Help from another person bathing (including washing, rinsing, drying)?: A Lot Help from another person to put on and taking off regular upper body clothing?: A Lot Help from another person to put on and taking off regular lower body clothing?: A Lot 6 Click Score: 13    End of Session    OT Visit Diagnosis: History of falling (Z91.81);Pain;Muscle weakness (generalized) (M62.81);Unsteadiness on feet (R26.81);Other abnormalities of gait and mobility (R26.89) Pain - Right/Left: Right Pain - part of body: Shoulder   Activity Tolerance Patient tolerated treatment well   Patient Left in bed;with call bell/phone within reach;with family/visitor present   Nurse Communication Mobility status        Time: 7829-5621 OT Time Calculation (min): 19 min  Charges: OT General Charges $OT Visit: 1 Visit OT Treatments $Therapeutic Exercise: 8-22 mins  Kathie Dike, M.S. OTR/L  11/29/23, 10:20 AM  ascom [phone]

## 2023-11-29 NOTE — Progress Notes (Signed)
Called patients husband Molly Maduro to make aware patient has been moved out to room 248

## 2023-11-29 NOTE — Consult Note (Signed)
Consultation Note Date: 11/29/2023   Patient Name: Tonya Cummings  DOB: Jan 22, 1951  MRN: [id number]  Age / Sex: 72 y.o., female  PCP: Tonya Males Hermenia Fiscal, NP Referring Physician: Darlin Priestly, MD  Reason for Consultation: Establishing goals of care  HPI/Patient Profile: Tonya Cummings is a 72 y.o. female with medical history significant of CKD Stage 4, HFpEF, HTN, B12 deficiency, anemia of CKD, rheumatoid arthritis, Raynaud's phenomenon, OSA not on CPAP, who presents to the hospital for a right shoulder arthroplasty.    Tonya Cummings states she was in her usual state of health when she presented to the hospital today for a scheduled procedure.  She notes that her right shoulder pain has acutely worsened approximately 2 days prior.  Otherwise, she denies any fever, chills, chest pain, shortness of breath, palpitations.  She endorses unchanged lower extremity swelling.   Clinical Assessment and Goals of Care: Notes and labs reviewed.  Into see patient.  She is currently resting in bed with mittens in place.  She states her mouth has felt dry.  She is able to tell me her name, that we are at [facility], and the year is 2024.  She states she is married and has 3 children.  Patient is able to tell me that she would want to have a feeding tube placed and would want any care needed to keep her alive.  Stepped out and called patient's husband.  Husband has been well updated and is able to articulate her current status and hospitalization in addition to issues prior to this hospitalization.   We discussed her diagnoses, prognosis, GOC, EOL wishes disposition and options.  Created space and opportunity for patient  to explore thoughts and feelings regarding current medical information.   A detailed discussion was had today regarding advanced directives.  Concepts specific to code status,  artifical feeding and hydration, IV antibiotics and rehospitalization were discussed.  The difference between an aggressive medical intervention path and a comfort care path was discussed.  Values and goals of care important to patient and family were attempted to be elicited.  He states his wife would want any care needed to keep her alive.  He states "it's like she says, she's a human being".  We discussed quality of life and that people have differing opinions on acceptable quality of life, and the care that they would or would not want.  He is amenable to a PEG tube.    SUMMARY OF RECOMMENDATIONS   Continue full code/full scope treatment.       Primary Diagnoses: Present on Admission:  Septic arthritis (HCC)  Hypertension  Seropositive rheumatoid arthritis (HCC)  Acute respiratory failure with hypoxia (HCC)   I have reviewed the medical record, interviewed the patient and family, and examined the patient. The following aspects are pertinent.  Past Medical History:  Diagnosis Date   Acute hypoxemic respiratory failure (HCC)    Anemia in stage 4 chronic kidney disease (HCC)    Anginal pain (HCC)  Anxiety    Aortic atherosclerosis (HCC)    CHF (congestive heart failure) (HCC)    a.) TTE 09/30/2021: EF 55-60%, no RWMAs, mild MR, G1DD   Chronic pain    Chronic radicular pain of lower back    CKD (chronic kidney disease), stage IV (HCC)    Complication of anesthesia    a.) delayed emergence following ureteroscopy 07/2023   Coronary artery calcification seen on CT scan    Costochondritis    DDD (degenerative disc disease), lumbar    Depression    Dyspnea    GERD (gastroesophageal reflux disease)    Hiatal hernia    Hip dysplasia    Hyperlipidemia    Hypertension    Insomnia    Iron deficiency    Iron deficiency anemia    Low back pain    Low vitamin B12 level    Lumbar facet joint pain    Migraines    Nose colonized with MRSA 10/03/2020   a.) preop PCR (+)  10/03/2020 prior to RIGHT TKA; b.) preop PCR (+) 08/25/2023 prior to RIGHT REVERSE SHOULDER ARTHROPLASTY; BICEPS TENODESIS   Numbness and tingling of right leg    OA (osteoarthritis)    Obesity    OSA (obstructive sleep apnea)    a.) not currently utilizing nocturnal PAP therapy; positional. PCCM feels as if symptom can be mitigated with diet/exercise/weight loss unless develops significant symptoms.   Osteoporosis    a.) recieves denosumab injections   Pelvic fracture (HCC)    Pneumonia    Pulmonary fibrosis (HCC)    Raynaud's disease without gangrene    Restless leg syndrome    a.) on pramipexole + oral Fe supplementation   Rheumatoid arthritis (HCC)    a.) Tx'd with hydroxychloroquine   Right renal mass 05/28/2023   a.) CT renal 05/28/2023:  5.7 cm mass in the medial aspect of right kidney   Seasonal allergies    Thoracic compression fracture (HCC)    Social History   Socioeconomic History   Marital status: Married    Spouse name: Molly Maduro  [phone]   Number of children: Not on file   Years of education: Not on file   Highest education level: Not on file  Occupational History   Not on file  Tobacco Use   Smoking status: Never   Smokeless tobacco: Never  Vaping Use   Vaping status: Never Used  Substance and Sexual Activity   Alcohol use: No   Drug use: No   Sexual activity: Yes  Other Topics Concern   Not on file  Social History Narrative   Not on file   Social Drivers of Health   Financial Resource Strain: Not on file  Food Insecurity: Patient Unable To Answer (11/20/2023)   Hunger Vital Sign    Worried About Running Out of Food in the Last Year: Patient unable to answer    Ran Out of Food in the Last Year: Patient unable to answer  Transportation Needs: Patient Unable To Answer (11/20/2023)   PRAPARE - Transportation    Lack of Transportation (Medical): Patient unable to answer    Lack of Transportation (Non-Medical): Patient unable to answer  Physical  Activity: Not on file  Stress: Not on file  Social Connections: Not on file   Family History  Problem Relation Age of Onset   Diabetes Mother    Hypertension Mother    Aneurysm Father    Diabetes Son    Seizures Son  Osteosarcoma Brother    Cancer Brother    Diabetes Brother    Diabetes Maternal Grandfather    Heart disease Maternal Grandfather    Scheduled Meds:  aspirin  325 mg Oral Daily   Chlorhexidine Gluconate Cloth  6 each Topical Daily   lidocaine  1 patch Transdermal Q24H   metoprolol tartrate  25 mg Oral BID   mupirocin ointment   Topical BID   pramipexole  0.125 mg Oral q morning   And   pramipexole  0.25 mg Oral QHS   sodium chloride flush  3 mL Intravenous Q12H   sodium chloride flush  3 mL Intravenous Q12H   Continuous Infusions:  sodium chloride 50 mL/hr at 11/29/23 1337   amiodarone 30 mg/hr (11/29/23 0600)   cefTRIAXone (ROCEPHIN)  IV Stopped (11/28/23 1822)   DAPTOmycin Stopped (11/28/23 1428)   heparin 1,850 Units/hr (11/29/23 1007)   PRN Meds:.bisacodyl, ipratropium-albuterol, metoprolol tartrate, morphine injection, mouth rinse, mouth rinse, polyethylene glycol, senna-docusate Medications Prior to Admission:  Prior to Admission medications   Medication Sig Start Date End Date Taking? Authorizing Provider  acetaminophen (TYLENOL) 500 MG tablet Take 2 tablets (1,000 mg total) by mouth every 6 (six) hours as needed. 08/31/23  Yes Anson Oregon, PA-C  aspirin EC 325 MG tablet Take 1 tablet (325 mg total) by mouth daily. 08/31/23  Yes Anson Oregon, PA-C  calcitRIOL (ROCALTROL) 0.25 MCG capsule Take 0.25 mcg by mouth every morning. 05/22/21  Yes [provider]  Calcium Carb-Cholecalciferol 600-400 MG-UNIT CAPS Take 1 capsule by mouth 3 (three) times daily. 01/13/10  Yes [provider]  chlorhexidine (HIBICLENS) 4 % external liquid Apply 15 mLs (1 Application total) topically as directed for 30 doses. Use as directed daily for 5  days every other week for 6 weeks. 11/18/23  Yes Signa Kell, MD  Cholecalciferol 25 MCG (1000 UT) tablet Take 1,000 Units by mouth every morning. 09/23/09  Yes [provider]  cyanocobalamin 1000 MCG tablet Take 1,000 mcg by mouth every morning.   Yes [provider]  cyclobenzaprine (FLEXERIL) 10 MG tablet Take 10 mg by mouth at bedtime as needed for muscle spasms (Leg cramps).   Yes [provider]  enalapril-hydrochlorothiazide (VASERETIC) 10-25 MG tablet Take 1 tablet by mouth at bedtime. 09/12/23  Yes Hammock, Lavonna Rua, NP  ferrous sulfate 325 (65 FE) MG tablet Take 325 mg by mouth daily with breakfast.   Yes [provider]  furosemide (LASIX) 40 MG tablet Take 1.5 tablets (60 mg total) by mouth every morning. Increase to 1.5 tablet (60 mg total) by mouth in morning and extra 1 tablet (40 mg total for maximum daily dose 100 mg) at lunch time as needed for up to 3 days for increased leg swelling, shortness of breath, weight gain 5+ lbs over 1-2 days. Seek medical care if these symptoms are not improving with increased dose. 09/02/23  Yes Sunnie Nielsen, DO  hydroxychloroquine (PLAQUENIL) 200 MG tablet Take 200 mg by mouth 2 (two) times daily. 06/22/21  Yes [provider]  mirtazapine (REMERON) 30 MG tablet Take 30 mg by mouth at bedtime. 06/29/22  Yes [provider]  Multiple Vitamins-Minerals (MULTIVITAMIN ADULT PO) Take 1 tablet by mouth every morning. 09/10/08  Yes [provider]  mupirocin ointment (BACTROBAN) 2 % Apply small about inside of both nostrils TWICE a day for the next 5 days. 08/25/23  Yes Verlee Monte, NP  mupirocin ointment (BACTROBAN) 2 % Place 1 Application  into the nose 2 (two) times daily for 60 doses. Use as directed 2 times daily for 5 days every other week for 6 weeks. 11/18/23 12/18/23 Yes Signa Kell, MD  omeprazole (PRILOSEC) 40 MG capsule Take 40 mg by mouth every morning.   Yes [provider]   ondansetron (ZOFRAN) 4 MG tablet Take 1 tablet (4 mg total) by mouth every 6 (six) hours as needed for nausea. 08/31/23  Yes Anson Oregon, PA-C  oxyCODONE (OXY IR/ROXICODONE) 5 MG immediate release tablet Take 1 tablet (5 mg total) by mouth every 4 (four) hours as needed for moderate pain (pain score 4-6) (pain score 4-6). 10/04/23  Yes Dedra Skeens, PA-C  Pirfenidone 267 MG TABS Take 801 tablets by mouth 3 (three) times daily. 12/24/22  Yes [provider]  pramipexole (MIRAPEX) 0.125 MG tablet Take 0.125-0.25 mg by mouth See admin instructions. Take 0.125 mg in the morning and 0.25 mg  at bedtime 02/15/18  Yes [provider]  PROLIA 60 MG/ML SOSY injection Inject 60 mg into the skin every 6 (six) months. 04/06/23  Yes [provider]  rosuvastatin (CRESTOR) 5 MG tablet Take 5 mg by mouth every other day. 07/18/19  Yes [provider]  spironolactone (ALDACTONE) 25 MG tablet Take 25 mg by mouth at bedtime. 04/09/21  Yes [provider]  tetrahydrozoline 0.05 % ophthalmic solution Place 1 drop into both eyes daily as needed (allergies).   Yes [provider]  venlafaxine XR (EFFEXOR-XR) 150 MG 24 hr capsule Take 150 mg by mouth See admin instructions. Take with 75 mg for total 225 mg in the morning 12/28/22 12/28/23 Yes [provider]  venlafaxine XR (EFFEXOR-XR) 75 MG 24 hr capsule Take 75 mg by mouth See admin instructions. Take with 150 mg for a total of 225 mg in the morning 08/24/23  Yes [provider]   Allergies  Allergen Reactions   Gabapentin Other (See Comments)    Weight gain   Ibuprofen Other (See Comments)    Headache   Review of Systems  All other systems reviewed and are negative.   Physical Exam Pulmonary:     Effort: Pulmonary effort is normal.  Neurological:     Mental Status: She is alert.     Vital Signs: BP 124/62   Pulse 84   Temp 98.6 F (37 C) (Axillary)   Resp 16   Ht 5\' 3"  (1.6 m)    Wt 75.9 kg   SpO2 100%   BMI 29.64 kg/m  Pain Scale: 0-10 POSS *See Group Information*: S-Acceptable,Sleep, easy to arouse Pain Score: 0-No pain   SpO2: SpO2: 100 % O2 Device:SpO2: 100 % O2 Flow Rate: .O2 Flow Rate (L/min): 2 L/min  IO: Intake/output summary:  Intake/Output Summary (Last 24 hours) at 11/29/2023 1429 Last data filed at 11/29/2023 1346 Gross per 24 hour  Intake 1206.12 ml  Output 945 ml  Net 261.12 ml    LBM: Last BM Date : 11/26/23 Baseline Weight: Weight: 77.1 kg Most recent weight: Weight: 75.9 kg      Signed by: Morton Stall, NP   Please contact Palliative Medicine Team phone at [phone] for questions and concerns.  For individual provider: See Loretha Stapler

## 2023-11-29 NOTE — Progress Notes (Signed)
Speech Language Pathology Treatment: Dysphagia  Patient Details Name: Tonya Cummings MRN: [id number] DOB: Aug 12, 1951 Today's Date: 11/29/2023 Time: 0865-7846 SLP Time Calculation (min) (ACUTE ONLY): 40 min  Assessment / Plan / Recommendation Clinical Impression  Pt seen for ongoing assessment of swallowing today. Pt was less awake and verbal than at other tx sessions. NSG reported pt was given mildly sedating meds "yesterday afternoon" d/t pulling at lines but "nothing last night" -- pt continues to present as drowsy/lethargic w/ only intermittent alerting to stim by SLP. Noted whispering voice; Dysphonia>Aphonia(recent oral intubation). Pt did not cough to command. Noted reduced alertness and attention to tasks. Poor head/body positioning continues. Husband reported the head lean R is d/t "arthritis". Respiratory effort remained calm, unlabored. On [place] O2 2L; afebrile. WBC 15.5.   Pt presents w/ suspected oropharyngeal phase Dysphagia w/ High risk for aspiration/aspiration pneumonia in setting of declined Pulmonary status - recent oral intubation for ~5 days s/p PEA arrest and CPR(now extubated), Aphonia and week cough effort, waxing/waning Alertness and Cognitive presentation, Esophageal phase dysmotility w/ Hiatal Hernia, GERD, and Full support w/ feeding; as well as Acute illness/lengthy hospitalization.    During po trials of Applesauce/puree placed at lips and on tip of tongue, pt demonstrated no immediate overt, clinical s/s of aspiration; no overt coughing noted, no decline in O2 sats or RR/HR to indicate distress. Pt only gave a whispered response x1 so unable to assess vocal quality. Respiratory effort appeared to remain calm, unlabored as at baseline w/ the exertion of po tasks/intake. No decline in ANS noted. Oral phase of swallowing of Applesauce appeared grossly functional when pt alerted to the bolus stim on lips/tongue(other times she opened her mouth ready for it). A-P transfer and  oral clearing appeared timely during those trials. W/ other trials, she was not attentive and required MOD++ cues to attend to bolus material placed orally.   Pt's body positioning worsened as time passed d/t movements in bed; this required repositioning midline w/ use of pillows.    In setting of pt's overall presentation described above and risk for aspiration/aspiration pneumonia, recommend continue NPO status w/ therapeutic trials of Applesauce w/ NSG/therapy post oral care; oral care frequently for hygiene and stimulation of swallowing. Aspiration precautions; feeding support and positioning.  Discussion was had w/ Husband: Education w/ Husband on general oropharyngeal swallowing and pt's Dysphagia; risk for aspiration/aspiration pneumonia; need for consideration of alternative means of feeding(PEG). ST services will f/u next 1-2 days w/ ongoing assessment of swallowing and trials to upgrade to least restrictive diet as appears safe for pt. Discussed w/ MD/NSG/Dietician the concern for pt to be able to meet her nutrition needs orally in setting of above presentation described -- requested a Palliative Care consult for pt and Husband to discuss overall GOC moving forward.       HPI HPI: Pt is a 72 y/o female with h/o Hiatal hernia, GERD, RLS, OSA, IDA, depression, Raynaud's disease, obesity, restless leg, anxiety, HLD, HTN, PAF, CKD IV, CHF, ILD, DDD, RA, right total knee arthroplasty (2021) and renal cysts who is admitted with septic joint s/p right reverse shoulder arthroplasty 12/6 complicated by PEA Arrest w/ CPR for ~5-6 mins per report, AKI, NSTEMI and pneumothorax 12/7. She was intubated, then extubated on 11/24/23.   Intraoperatively, it was noted that patient's joint appeared infected.    CXR: 11/26/23, "1. Increased patchy airspace disease in the right upper lobe with  unchanged denser consolidation right lower lung field.  2. Increased  streaky atelectasis or infiltrate in the retrocardiac   left lower lobe.  3. Small pleural effusions.  4. Stable cardiomegaly and mild central vascular prominence.  5. Interval removal of ETT/NGT."      SLP Plan  Continue with current plan of care      Recommendations for follow up therapy are one component of a multi-disciplinary discharge planning process, led by the attending physician.  Recommendations may be updated based on patient status, additional functional criteria and insurance authorization.    Recommendations  Diet recommendations: NPO (w/ therapeutic trials of Applesauce w/ NSG and therapy when pt is appropriate) Medication Administration: Via alternative means (vs critical meds Crushed in applesauce) Supervision: Staff to assist with self feeding;Full supervision/cueing for compensatory strategies Compensations: Minimize environmental distractions;Slow rate;Small sips/bites;Lingual sweep for clearance of pocketing;Multiple dry swallows after each bite/sip Postural Changes and/or Swallow Maneuvers: Out of bed for meals;Seated upright 90 degrees;Upright 30-60 min after meal                 (Palliative Care consult; Dietician following) Oral care before and after PO;Staff/trained caregiver to provide oral care;Oral care QID   Frequent or constant Supervision/Assistance Dysphagia, oropharyngeal phase (R13.12)     Continue with current plan of care         Jerilynn Som, MS, CCC-SLP Speech Language Pathologist Rehab Services; [facility] [place] [phone] (ascom) Nataline Basara  11/29/2023, 12:13 PM

## 2023-11-29 NOTE — Consult Note (Signed)
PHARMACY - ANTICOAGULATION CONSULT NOTE  Pharmacy Consult for IV Heparin Indication: atrial fibrillation  Patient Measurements: Height: 5\' 3"  (160 cm) Weight: 75.9 kg (167 lb 5.3 oz) IBW/kg (Calculated) : 52.4 Heparin Dosing Weight: 70 kg  Labs: Recent Labs    11/27/23 0322 11/27/23 0335 11/28/23 0430 11/29/23 0429  HGB 7.9*  --  8.3* 7.8*  HCT 26.4*  --  26.8* 25.1*  PLT 374  --  413* 419*  HEPARINUNFRC  --  0.31 0.34 0.19*  CREATININE 2.52*  --  2.52*  --    Estimated Creatinine Clearance: 20.3 mL/min (A) (by C-G formula based on SCr of 2.52 mg/dL (H)).  Medical History: Past Medical History:  Diagnosis Date   Acute hypoxemic respiratory failure (HCC)    Anemia in stage 4 chronic kidney disease (HCC)    Anginal pain (HCC)    Anxiety    Aortic atherosclerosis (HCC)    CHF (congestive heart failure) (HCC)    a.) TTE 09/30/2021: EF 55-60%, no RWMAs, mild MR, G1DD   Chronic pain    Chronic radicular pain of lower back    CKD (chronic kidney disease), stage IV (HCC)    Complication of anesthesia    a.) delayed emergence following ureteroscopy 07/2023   Coronary artery calcification seen on CT scan    Costochondritis    DDD (degenerative disc disease), lumbar    Depression    Dyspnea    GERD (gastroesophageal reflux disease)    Hiatal hernia    Hip dysplasia    Hyperlipidemia    Hypertension    Insomnia    Iron deficiency    Iron deficiency anemia    Low back pain    Low vitamin B12 level    Lumbar facet joint pain    Migraines    Nose colonized with MRSA 10/03/2020   a.) preop PCR (+) 10/03/2020 prior to RIGHT TKA; b.) preop PCR (+) 08/25/2023 prior to RIGHT REVERSE SHOULDER ARTHROPLASTY; BICEPS TENODESIS   Numbness and tingling of right leg    OA (osteoarthritis)    Obesity    OSA (obstructive sleep apnea)    a.) not currently utilizing nocturnal PAP therapy; positional. PCCM feels as if symptom can be mitigated with diet/exercise/weight loss unless  develops significant symptoms.   Osteoporosis    a.) recieves denosumab injections   Pelvic fracture (HCC)    Pneumonia    Pulmonary fibrosis (HCC)    Raynaud's disease without gangrene    Restless leg syndrome    a.) on pramipexole + oral Fe supplementation   Rheumatoid arthritis (HCC)    a.) Tx'd with hydroxychloroquine   Right renal mass 05/28/2023   a.) CT renal 05/28/2023:  5.7 cm mass in the medial aspect of right kidney   Seasonal allergies    Thoracic compression fracture (HCC)    Medications:  No anticoagulation prior to admission per my chart review  Assessment: 72 y/o F with a past medical history of CKD IV, HFpEF, hypertension, RA, OSA not on CPAP who came to [facility] 12/6 for right reverse shoulder arthroplasty after for suspicion of septic joint. Patient experienced cardiac arrest post-operatively on 12/7. She is intubated, sedated and on mechanical ventilation in the ICU. Hospital course now further complicated by paroxysmal Afib with RVR. Patient has been placed on amiodarone. Pharmacy consulted for heparin.  Baseline aPTT and PT-INR are pending. CBC is notable for anemia which is chronic.  12/10 20:29 HL <0.10 12/11 0544 HL 0.13, subtherapeutic 12/11 1539  HL 0.29, subtherapeutic 12/12 0039 HL 0.34, therapeutic x 1 12/12 0920 HL 0.28, subtherapeutic 12/12 1817 HL 0.42, therapeutic x 1 12/13 0152 HL 0.39, therapeutic x 2 12/14 0434 HL 0.42, therapeutic x 3 12/15 0322 HL 0.31, therapeutic x 4 12/16 0430 HL 0.34, therapeutic x 5 12/17 0429 HL 0.19, SUBtherapeutic   Goal of Therapy:  Heparin level 0.3-0.7 units/ml Monitor platelets by anticoagulation protocol: Yes   Plan:  12/17:  HL @ 0429 = 0.19, SUBtherapeutic  - Will order heparin 2100 units IV X 1 bolus and increase drip rate to 1850 units/hr - Will recheck HL 8 hrs after rate change   Olman Yono D 11/29/2023 7:11 AM

## 2023-11-29 NOTE — Plan of Care (Signed)
  Problem: Health Behavior/Discharge Planning: Goal: Ability to manage health-related needs will improve Outcome: Progressing   Problem: Clinical Measurements: Goal: Ability to maintain clinical measurements within normal limits will improve Outcome: Progressing Goal: Will remain free from infection Outcome: Progressing Goal: Diagnostic test results will improve Outcome: Progressing Goal: Respiratory complications will improve Outcome: Progressing Goal: Cardiovascular complication will be avoided Outcome: Progressing   Problem: Activity: Goal: Risk for activity intolerance will decrease Outcome: Progressing   Problem: Nutrition: Goal: Adequate nutrition will be maintained Outcome: Progressing   Problem: Coping: Goal: Level of anxiety will decrease Outcome: Progressing   Problem: Elimination: Goal: Will not experience complications related to bowel motility Outcome: Progressing Goal: Will not experience complications related to urinary retention Outcome: Progressing   Problem: Pain Management: Goal: General experience of comfort will improve Outcome: Progressing   Problem: Safety: Goal: Ability to remain free from injury will improve Outcome: Progressing   Problem: Skin Integrity: Goal: Risk for impaired skin integrity will decrease Outcome: Progressing   Problem: Education: Goal: Knowledge of the prescribed therapeutic regimen will improve Outcome: Progressing Goal: Understanding of activity limitations/precautions following surgery will improve Outcome: Progressing   Problem: Activity: Goal: Ability to tolerate increased activity will improve Outcome: Progressing   Problem: Pain Management: Goal: Pain level will decrease with appropriate interventions Outcome: Progressing

## 2023-11-29 NOTE — TOC Progression Note (Signed)
Transition of Care [facility]) - Progression Note    Patient Details  Name: Tonya Cummings MRN: [id number] Date of Birth: 04-03-1951  Transition of [facility]) CM/SW Contact  Liliana Cline, LCSW Phone Number: 11/29/2023, 8:09 AM  Clinical Narrative:   TOC continues to follow.     Expected Discharge Plan: OP Rehab Barriers to Discharge: Continued Medical Work up  Expected Discharge Plan and Services       Living arrangements for the past 2 months: Single Family Home                                       Social Determinants of Health (SDOH) Interventions SDOH Screenings   Food Insecurity: Patient Unable To Answer (11/20/2023)  Housing: Patient Unable To Answer (11/20/2023)  Transportation Needs: Patient Unable To Answer (11/20/2023)  Utilities: Patient Unable To Answer (11/20/2023)  Depression (PHQ2-9): Low Risk  (09/02/2022)  Tobacco Use: Low Risk  (11/18/2023)    Readmission Risk Interventions    11/19/2023   11:23 AM 10/04/2023   10:38 AM 09/02/2023   10:04 AM  Readmission Risk Prevention Plan  Transportation Screening Complete Complete Complete  PCP or Specialist Appt within 3-5 Days Complete Complete Complete  HRI or Home Care Consult Complete  Complete  Social Work Consult for Recovery Care Planning/Counseling Complete Complete Complete  Palliative Care Screening Not Applicable Not Applicable Not Applicable  Medication Review Oceanographer) Complete Complete Complete

## 2023-11-29 NOTE — Progress Notes (Signed)
PT Cancellation Note  Patient Details Name: Tonya Cummings MRN: [id number] DOB: [birth year]   Cancelled Treatment:    Reason Eval/Treat Not Completed: Patient at procedure or test/unavailable, will attempt to see pt at a future date/time as medically appropriate.    Ovidio Hanger PT, DPT 11/29/23, 3:51 PM

## 2023-11-29 NOTE — Progress Notes (Signed)
PHARMACY CONSULT NOTE  Pharmacy Consult for Electrolyte Monitoring and Replacement   Recent Labs: Potassium (mmol/L)  Date Value  11/29/2023 4.3   Magnesium (mg/dL)  Date Value  16/09/9603 2.5 (H)   Calcium (mg/dL)  Date Value  54/08/8118 9.6   Albumin (g/dL)  Date Value  14/78/2956 2.6 (L)   Phosphorus (mg/dL)  Date Value  21/30/8657 5.0 (H)   Sodium (mmol/L)  Date Value  11/29/2023 149 (H)  09/12/2023 142   Assessment: 72 y.o. female with medical history significant of CKD Stage 4, HFpEF, HTN, B12 deficiency, anemia of CKD, rheumatoid arthritis, Raynaud's phenomenon, OSA not on CPAP, who was admitted on 12/6 for recurrent R shoulder revision following arthroplasty. Pt is on amio infusion.   Goal of Therapy:  Potassium 4.0 - 5.1 mmol/L Magnesium 2.0 - 2.4 mg/dL All Other Electrolytes WNL  Plan:  --no electrolyte replacement warranted for today --f/u with AM labs.   Lowella Bandy, PharmD, BCPS 11/29/2023 7:22 AM

## 2023-11-29 NOTE — Progress Notes (Signed)
Patient was taken to CT by transporter and myself. Then transported to room 248. No distress noted. On 02 at 2L

## 2023-11-29 NOTE — Progress Notes (Signed)
Rounding Note    Patient Name: Tonya Cummings Date of Encounter: 11/29/2023  [facility] Cardiologist: Julien Nordmann, MD   Subjective   Patient is somnolent on exam. Was restless/agitated overnight and given several medications. Is not able to take PO meds at this time.   Inpatient Medications    Scheduled Meds:  aspirin  325 mg Oral Daily   Chlorhexidine Gluconate Cloth  6 each Topical Daily   lidocaine  1 patch Transdermal Q24H   metoprolol tartrate  25 mg Oral BID   mupirocin ointment   Topical BID   pramipexole  0.125 mg Oral q morning   And   pramipexole  0.25 mg Oral QHS   sodium chloride flush  3 mL Intravenous Q12H   sodium chloride flush  3 mL Intravenous Q12H   Continuous Infusions:  sodium chloride 50 mL/hr at 11/29/23 0600   amiodarone 30 mg/hr (11/29/23 0600)   cefTRIAXone (ROCEPHIN)  IV Stopped (11/28/23 1822)   DAPTOmycin Stopped (11/28/23 1428)   heparin 1,850 Units/hr (11/29/23 1007)   PRN Meds: bisacodyl, ipratropium-albuterol, metoprolol tartrate, morphine injection, mouth rinse, mouth rinse, polyethylene glycol, senna-docusate   Vital Signs    Vitals:   11/29/23 0930 11/29/23 1000 11/29/23 1100 11/29/23 1146  BP:  97/73 (!) 118/104   Pulse: 77  (!) 58 93  Resp: 17 15 (!) 32 12  Temp:    98.6 F (37 C)  TempSrc:    Axillary  SpO2: 99%  100% 100%  Weight:      Height:        Intake/Output Summary (Last 24 hours) at 11/29/2023 1204 Last data filed at 11/29/2023 1011 Gross per 24 hour  Intake 1278.61 ml  Output 795 ml  Net 483.61 ml      11/29/2023    4:49 AM 11/28/2023    4:00 AM 11/27/2023    5:52 AM  Last 3 Weights  Weight (lbs) 167 lb 5.3 oz 161 lb 2.5 oz 162 lb 4.1 oz  Weight (kg) 75.9 kg 73.1 kg 73.6 kg      Telemetry    Aflutter rate 80-100 up to 150 bpm - Personally Reviewed  Physical Exam   GEN: No acute distress. Somnolent on exam. Neck: No JVD Cardiac: IRIR, no murmurs, rubs, or gallops.   Respiratory: Clear to auscultation bilaterally. GI: Soft, nontender, non-distended  MS: No edema; No deformity. Neuro:  Nonfocal  Psych: Normal affect   Labs    High Sensitivity Troponin:   Recent Labs  Lab 11/19/23 1310 11/19/23 1455 11/19/23 2039 11/20/23 0252 11/20/23 2207  TROPONINIHS 208* 392* 749* 804* 293*     Chemistry Recent Labs  Lab 11/25/23 0152 11/25/23 0152 11/26/23 0434 11/27/23 0322 11/28/23 0430 11/29/23 0429  NA 137  --  143 144 145 149*  K 4.6  --  4.6 4.3 3.9 4.3  CL 103  --  107 106 107 111  CO2 20*  --  22 24 25 23   GLUCOSE 92  --  92 119* 111* 110*  BUN 90*  --  83* 83* 78* 81*  CREATININE 2.74*  --  2.58* 2.52* 2.52* 2.80*  CALCIUM 8.9  --  9.7 9.2 9.7 9.6  MG 2.1  --  2.4  --  2.5*  --   ALBUMIN  --    < > 2.4* 2.3* 2.7* 2.6*  GFRNONAA 18*  --  19* 20* 20* 18*  ANIONGAP 14  --  14 14 13 15    < > =  values in this interval not displayed.    Lipids No results for input(s): "CHOL", "TRIG", "HDL", "LABVLDL", "LDLCALC", "CHOLHDL" in the last 168 hours.  Hematology Recent Labs  Lab 11/27/23 0322 11/28/23 0430 11/29/23 0429  WBC 12.6* 14.1* 15.5*  RBC 2.85* 2.98* 2.74*  HGB 7.9* 8.3* 7.8*  HCT 26.4* 26.8* 25.1*  MCV 92.6 89.9 91.6  MCH 27.7 27.9 28.5  MCHC 29.9* 31.0 31.1  RDW 16.9* 17.2* 17.4*  PLT 374 413* 419*   Thyroid No results for input(s): "TSH", "FREET4" in the last 168 hours.  BNPNo results for input(s): "BNP", "PROBNP" in the last 168 hours.  DDimer No results for input(s): "DDIMER" in the last 168 hours.   Radiology    No results found.  Cardiac Studies   11/20/2023 Echocardiogram 1. Left ventricular ejection fraction, by estimation, is 55 to 60%. The  left ventricle has normal function. The left ventricle has no regional  wall motion abnormalities. There is mild concentric left ventricular  hypertrophy. Left ventricular diastolic  parameters are indeterminate.   2. Right ventricular systolic function is normal.  The right ventricular  size is normal.   3. Left atrial size was moderately dilated.   4. The mitral valve is grossly normal. Trivial mitral valve  regurgitation. No evidence of mitral stenosis.   5. The aortic valve is grossly normal. There is mild calcification of the  aortic valve. There is mild thickening of the aortic valve. Aortic valve  regurgitation is not visualized. Aortic valve sclerosis/calcification is  present, without any evidence of  aortic stenosis.   Patient Profile     72 y.o. female with a h/o HFpEF, CKD stage 4, HTN who presented for shoulder surgery, post-op course complicated by respiratory failure and PEA arrest requiring CPR with ROSC, is being seen for new onset Afib with RVR.   Assessment & Plan    Afib/flutter with RVR - New s/p post-op PEA arrest requiring CPR - Remains in atrial fibrillation with rates average 80-100, up to 150 bpm - Continue IV amiodarone  - Continue metoprolol 25 mg BID when patient able (currently NPO) - Continue IV heparin - Plan for DOAC on d/c - Consider TEE/DCCV if patient does not convert to sinus medically  PEA arrest NSTEMI - Post-op PEA arrest requiring CPR for 5-6 minutes with achievement of ROSC - Patient was intubated and transferred to the ICU requiring pressors. She was extubated 12/13. - off pressors - HS trop 749>804 - Echo showed LVEF 55-60% - Will need ischemic workup, limited by CKD  HTN - BP stable 120/60s on average, seems to become elevated with agitation - Not on antihypertensives now - Consider addition of low dose BB  AI on CKD IV - Cr 2.52>>2.8, baseline ~2  For questions or updates, please contact [facility] Please consult [email or web address] for contact info under        Signed, Orion Crook, PA-C  11/29/2023, 12:04 PM

## 2023-11-29 NOTE — Progress Notes (Signed)
Nutrition Follow Up Note   DOCUMENTATION CODES:   Not applicable  INTERVENTION:   Recommend G-tube placement and nutrition support  RD will add supplements if diet is able to be advanced.   If G-tube placed, recommend:  Osmolite 1.2@65ml /hr- Initiate at 44ml/hr and increase by 48ml/hr q 8 hours until goal rate is reached.   Free water flushes q4 hours   Regimen provides 1872kcal/day, 87g/day protein and 1849ml/day of free water   Pt at high refeed risk; recommend monitor potassium, magnesium and phosphorus labs daily until stable  Daily weights   NUTRITION DIAGNOSIS:   Inadequate oral intake related to inability to eat (pt sedated and ventilated) as evidenced by NPO status. -ongoing   GOAL:   Patient will meet greater than or equal to 90% of their needs -not met   MONITOR:   Diet advancement, Labs, Weight trends, I & O's, Skin  ASSESSMENT:   72 y/o female with h/o hiatal hernia, GERD, RLS, OSA, IDA, depression, anxiety, HLD, HTN, PAF, CKD IV, CHF, ILD, DDD, RA, right total knee arthroplasty (2021) and renal cysts who is admitted with septic joint s/p right reverse shoulder arthroplasty 12/6 complicated by PEA arrest, AKI, NSTEMI and pneumothorax 12/7. Chest tube removed 12/11.   Pt extubated 12/12. Visited pt's room today. Pt with ongoing confusion and is unable to provide any history. Pt is now without adequate nutrition for > 10 days. NGT unable to be placed by nursing and pt refused any further attempts. Pt seen by SLP and remains NPO secondary to high aspiration risk. Spoke with pt's husband via phone. Discussed concerns over pt's poor nutritional status, high aspiration risk and recommendations for G-tube placement. Husband reports that at baseline, pt does not eat much. Husband also reports that patient has been having issues with swallowing over the past month and has had to spit food back up at least three times. Husband is in agreement to proceed with IR G-tube  placement. Will plan to start tube feeds once tube in place and ready for use. Pt is at high refeed risk. Per chart, pt is down ~19lbs since admission but appears to be back at her UBW. Pt with hypernatremia today; pt is receiving IVF. Will add free water once enteral access in place.       Medications reviewed and include: aspirin, NaCl @50ml /hr, ceftriaxone, daptomycin, heparin  Labs reviewed: Na 149(H), K 4.3 wnl, BUN 81(H), creat 2.80(H), P 5.0(H) Mg 2.5(H)- 12/16 Wbc- 15.5(H), Hgb  7.8(L), Hct 25.1(L) Cbgs- 91, 126, 103, 113, 98, 109 x 48 hrs   Nutrition Follow Up Note:  Flowsheet Row Most Recent Value  Orbital Region No depletion  Upper Arm Region Mild depletion  Thoracic and Lumbar Region No depletion  Buccal Region No depletion  Temple Region No depletion  Clavicle Bone Region No depletion  Clavicle and Acromion Bone Region No depletion  Scapular Bone Region No depletion  Dorsal Hand No depletion  Patellar Region No depletion  Anterior Thigh Region No depletion  Posterior Calf Region No depletion  Edema (RD Assessment) Mild  Hair Reviewed  Eyes Reviewed  Mouth Reviewed  Skin Reviewed  Nails Reviewed   Diet Order:   Diet Order             Diet NPO time specified  Diet effective now                  EDUCATION NEEDS:   No education needs have been identified at this  time  Skin:  Skin Assessment: Skin Integrity Issues: Skin Integrity Issues:: Incisions Incisions: clsoed rt shoulder  Last BM:  12/14  Height:   Ht Readings from Last 1 Encounters:  11/21/23 5\' 3"  (1.6 m)    Weight:   Wt Readings from Last 1 Encounters:  11/29/23 75.9 kg    Ideal Body Weight:  52 kg  BMI:  Body mass index is 29.64 kg/m.  Estimated Nutritional Needs:   Kcal:  1700-1900kcal/day  Protein:  85-95g/day  Fluid:  1.4-1.6L/day  Betsey Holiday MS, RD, LDN Please refer to [facility] for RD and/or RD on-call/weekend/after hours pager

## 2023-11-29 NOTE — Progress Notes (Signed)
Progress Note   Patient: Tonya Cummings:[id number] DOB: [birth year] DOA: 11/18/2023     11 DOS: the patient was seen and examined on 11/29/2023   Brief hospital course:  72 year old female patient with a past medical history of CKD stage IV, heart failure with preserved EF, hypertension, rheumatoid arthritis, OSA not on CPAP presents to [facility] on 12/06 for right reverse shoulder arthroplasty after for suspicion of septic joint. She initially underwent right shoulder arthroplasty on 08/30/2023 after which she developed a.  Periprosthetic fracture status post conversion to a longstem humeral component with ORIF on 10/03/2023.  No evidence of infection at the time. Follow-up lab work showed elevated inflammatory markers including ESR suspicious for an infectious process.  Therefore she presents back on 12/06 for shoulder arthroplasty explant of glenoid and humeral component open treatment of right humerus fracture with intramedullary device and insertion of antibiotic delivery system.  Small area of purulence was noted.  Cultures were sent.  Started on daptomycin and ceftriaxone.   She did well immediately postop however on 12/07 she was getting up to the bedside commode she went into respiratory distress with O2 sat dropping becoming apneic and went into PEA arrest.  CPR for roughly 5 to 6-minute prior to ROSC epi x 2.  Not shockable rhythm.  Patient intubated and transferred to the ICU for further care.  Postintubation chest x-ray with right pneumothorax status post chest tube placement 12/07.  HD catheter placed 12/07 anticipating need for dialysis given worsening kidney function.   During ICU stay patient had circulatory shock requiring pressors.  She has elevated troponin in the setting of cardiac arrest, new onset A-fib.  Echocardiogram 12/08 with normal LVEF 55 to 60%.  RV systolic function is normal and size is normal. Ultrasound venous lower extremity 12/08 negative  for DVT. Patient had pneumothorax 12/7 for which a right chest tube was placed which is removed 12/11.  Her kidney function remained stable and nephrologist followed advised no acute indication for dialysis. She is successfully extubated 12/13 and transferred to [facility] service.   Assessment and Plan: PEA arrest NSTEMI New onset A-fib: Circulatory shock, off pressors now. She is on heparin gtt. Echo showed EF 55-60%.  She is continued on amiodarone gtt for rapid A-fib per cardiology recommendations.  Heart rate 110-150. Cardiology follow-up appreciated, start Lopressor once able to take p.o., she will need anticoagulant prior to discharge. --cont amio gtt and heparin gtt --IV metop PRN  Acute hypoxic respiratory failure Right pneumothorax Status post right-sided chest tube placed 12/7, removed 12/11. Possible aspiration pneumonia Patient is currently on 2 L supplemental oxygen postextubation 12/13. Continue aggressive pulmonary toilet, DuoNebs as needed. Encourage out of bed to chair, incentive spirometry. ----Continue supplemental O2 to keep sats >=92%, wean as tolerated  Acute toxic metabolic encephalopathy Hospital delirium In the setting of PEA arrest requiring airway protection, on sedation. --supportive care  Dysphagia: Hoarseness s/p extubation. SLP evaluation and rec NPO RN unable to place NG as she cannot swallow, patient refused tube placement. --husband elected for G-tube placement --cont MIVF  Hypernatremia --due to lack of free water intake --cont MIVF as 1/2 NS  Acute on chronic kidney injury stage 4: Acute kidney injury in the setting of hypoperfusion. Avoid nephrotoxic drugs.   --cont MIVF  Right reverse shoulder arthroplasty 9/17, status post revision surgery on 1021, 11/18/2023- Hardware explanted 12/6.  ID consulted.  Tissue cultures so far negative. --cont ceftriaxone and dapto  Anemia of chronic disease: Hemoglobin  stable.  No active bleeding. --hold  iron, B12 supplements while NPO  Obesity with BMI 31.40 Contributing to her current condition. OSA not on CPAP. Diet, exercise and weight reduction advised. --BG q6h to monitor for hypogylcemia  Debility and deconditioning: Out of bed to chair, encourage incentive spirometry. PT OT evaluation.    Nursing supportive care. Fall, aspiration precautions. DVT prophylaxis heparin drip   Code Status: Full Code  Subjective:  Pt was somnolent today.   Physical Exam: Vitals:   11/29/23 1400 11/29/23 1500 11/29/23 1629 11/29/23 1632  BP: 124/62 (!) 136/59 133/73   Pulse: 84 76 73   Resp: 16 13 16    Temp:   97.8 F (36.6 C)   TempSrc:   Oral   SpO2: 100% 99% (!) 89% 97%  Weight:      Height:        Constitutional: NAD, somnolent, arousable HEENT: conjunctivae and lids normal, EOMI CV: No cyanosis.   RESP: normal respiratory effort, on 2L SKIN: warm, dry Foley present   Data Reviewed:      Latest Ref Rng & Units 11/29/2023    4:29 AM 11/28/2023    4:30 AM 11/27/2023    3:22 AM  CBC  WBC 4.0 - 10.5 K/uL 15.5  14.1  12.6   Hemoglobin 12.0 - 15.0 g/dL 7.8  8.3  7.9   Hematocrit 36.0 - 46.0 % 25.1  26.8  26.4   Platelets 150 - 400 K/uL 419  413  374       Latest Ref Rng & Units 11/29/2023    4:29 AM 11/28/2023    4:30 AM 11/27/2023    3:22 AM  BMP  Glucose 70 - 99 mg/dL 161  096  045   BUN 8 - 23 mg/dL 81  78  83   Creatinine 0.44 - 1.00 mg/dL 4.09  8.11  9.14   Sodium 135 - 145 mmol/L 149  145  144   Potassium 3.5 - 5.1 mmol/L 4.3  3.9  4.3   Chloride 98 - 111 mmol/L 111  107  106   CO2 22 - 32 mmol/L 23  25  24    Calcium 8.9 - 10.3 mg/dL 9.6  9.7  9.2    No results found.    Family Communication:  Disposition: Status is: Inpatient Remains inpatient appropriate because: A-fib with RVR, IV antibiotics, IV heparin, cardiology follow-up  Planned Discharge Destination:  to be determined     MDM level 3   Author: Darlin Priestly, MD 11/29/2023 5:31  PM Secure chat 7am to 7pm For on call review [email or web address].

## 2023-11-30 ENCOUNTER — Inpatient Hospital Stay: Payer: Medicare HMO | Admitting: Radiology

## 2023-11-30 ENCOUNTER — Inpatient Hospital Stay: Payer: Medicare HMO

## 2023-11-30 DIAGNOSIS — Z22322 Carrier or suspected carrier of Methicillin resistant Staphylococcus aureus: Secondary | ICD-10-CM

## 2023-11-30 DIAGNOSIS — M9731XA Periprosthetic fracture around internal prosthetic right shoulder joint, initial encounter: Secondary | ICD-10-CM | POA: Diagnosis not present

## 2023-11-30 DIAGNOSIS — I469 Cardiac arrest, cause unspecified: Secondary | ICD-10-CM | POA: Diagnosis not present

## 2023-11-30 DIAGNOSIS — L8990 Pressure ulcer of unspecified site, unspecified stage: Secondary | ICD-10-CM | POA: Insufficient documentation

## 2023-11-30 DIAGNOSIS — Z7189 Other specified counseling: Secondary | ICD-10-CM | POA: Diagnosis not present

## 2023-11-30 DIAGNOSIS — M9731XD Periprosthetic fracture around internal prosthetic right shoulder joint, subsequent encounter: Secondary | ICD-10-CM | POA: Diagnosis not present

## 2023-11-30 DIAGNOSIS — N184 Chronic kidney disease, stage 4 (severe): Secondary | ICD-10-CM | POA: Diagnosis not present

## 2023-11-30 HISTORY — PX: IR GASTROSTOMY TUBE MOD SED: IMG625

## 2023-11-30 LAB — MAGNESIUM
Magnesium: 2.5 mg/dL — ABNORMAL HIGH (ref 1.7–2.4)
Magnesium: 2.4 mg/dL (ref 1.7–2.4)

## 2023-11-30 LAB — RENAL FUNCTION PANEL
Albumin: 2.5 g/dL — ABNORMAL LOW (ref 3.5–5.0)
Anion gap: 15 (ref 5–15)
BUN: 85 mg/dL — ABNORMAL HIGH (ref 8–23)
CO2: 21 mmol/L — ABNORMAL LOW (ref 22–32)
Calcium: 9.2 mg/dL (ref 8.9–10.3)
Chloride: 112 mmol/L — ABNORMAL HIGH (ref 98–111)
Creatinine, Ser: 3.03 mg/dL — ABNORMAL HIGH (ref 0.44–1.00)
GFR, Estimated: 16 mL/min — ABNORMAL LOW (ref >60)
Glucose, Bld: 102 mg/dL — ABNORMAL HIGH (ref 70–99)
Phosphorus: 5.8 mg/dL — ABNORMAL HIGH (ref 2.5–4.6)
Potassium: 4.6 mmol/L (ref 3.5–5.1)
Sodium: 148 mmol/L — ABNORMAL HIGH (ref 135–145)

## 2023-11-30 LAB — CBC
HCT: 26.1 % — ABNORMAL LOW (ref 36.0–46.0)
Hemoglobin: 7.9 g/dL — ABNORMAL LOW (ref 12.0–15.0)
MCH: 27.6 pg (ref 26.0–34.0)
MCHC: 30.3 g/dL (ref 30.0–36.0)
MCV: 91.3 fL (ref 80.0–100.0)
Platelets: 418 10*3/uL — ABNORMAL HIGH (ref 150–400)
RBC: 2.86 MIL/uL — ABNORMAL LOW (ref 3.87–5.11)
RDW: 17.9 % — ABNORMAL HIGH (ref 11.5–15.5)
WBC: 17.6 10*3/uL — ABNORMAL HIGH (ref 4.0–10.5)
nRBC: 1.1 % — ABNORMAL HIGH (ref 0.0–0.2)

## 2023-11-30 LAB — PHOSPHORUS: Phosphorus: 6.3 mg/dL — ABNORMAL HIGH (ref 2.5–4.6)

## 2023-11-30 LAB — GLUCOSE, CAPILLARY
Glucose-Capillary: 101 mg/dL — ABNORMAL HIGH (ref 70–99)
Glucose-Capillary: 76 mg/dL (ref 70–99)
Glucose-Capillary: 69 mg/dL — ABNORMAL LOW (ref 70–99)
Glucose-Capillary: 95 mg/dL (ref 70–99)
Glucose-Capillary: 108 mg/dL — ABNORMAL HIGH (ref 70–99)
Glucose-Capillary: 109 mg/dL — ABNORMAL HIGH (ref 70–99)
Glucose-Capillary: 114 mg/dL — ABNORMAL HIGH (ref 70–99)
Glucose-Capillary: 101 mg/dL — ABNORMAL HIGH (ref 70–99)

## 2023-11-30 LAB — CK: Total CK: 96 U/L (ref 38–234)

## 2023-11-30 LAB — HEPARIN LEVEL (UNFRACTIONATED): Heparin Unfractionated: 0.34 [IU]/mL (ref 0.30–0.70)

## 2023-11-30 MED ORDER — DEXTROSE 50 % IV SOLN
12.5000 g | INTRAVENOUS | Status: AC
  Administered 2023-11-30: 12.5 g via INTRAVENOUS
  Filled 2023-11-30: qty 50

## 2023-11-30 MED ORDER — FENTANYL CITRATE (PF) 100 MCG/2ML IJ SOLN
INTRAMUSCULAR | Status: AC | PRN
  Administered 2023-11-30 (×2): 25 ug via INTRAVENOUS

## 2023-11-30 MED ORDER — IOHEXOL 300 MG/ML  SOLN
10.0000 mL | Freq: Once | INTRAMUSCULAR | Status: AC | PRN
  Administered 2023-11-30: 10 mL

## 2023-11-30 MED ORDER — SODIUM CHLORIDE 0.9 % IV SOLN
100.0000 mg | Freq: Every day | INTRAVENOUS | Status: DC
  Administered 2023-11-30 – 2023-12-01 (×2): 100 mg via INTRAVENOUS
  Filled 2023-11-30 (×3): qty 5

## 2023-11-30 MED ORDER — THIAMINE MONONITRATE 100 MG PO TABS
100.0000 mg | ORAL_TABLET | Freq: Every day | ORAL | Status: DC
  Administered 2023-12-01: 100 mg
  Filled 2023-11-30: qty 1

## 2023-11-30 MED ORDER — SODIUM CHLORIDE 0.9 % IV SOLN
INTRAVENOUS | Status: AC | PRN
  Administered 2023-11-30: 50 mL via INTRAVENOUS

## 2023-11-30 MED ORDER — CEFAZOLIN SODIUM-DEXTROSE 2-4 GM/100ML-% IV SOLN
INTRAVENOUS | Status: AC
  Filled 2023-11-30: qty 100

## 2023-11-30 MED ORDER — GLUCAGON HCL RDNA (DIAGNOSTIC) 1 MG IJ SOLR
INTRAMUSCULAR | Status: AC | PRN
  Administered 2023-11-30: 1 mg via INTRAVENOUS

## 2023-11-30 MED ORDER — CEFAZOLIN SODIUM-DEXTROSE 1-4 GM/50ML-% IV SOLN
INTRAVENOUS | Status: AC | PRN
  Administered 2023-11-30: 2 g via INTRAVENOUS

## 2023-11-30 MED ORDER — LIDOCAINE HCL 1 % IJ SOLN
9.0000 mL | Freq: Once | INTRAMUSCULAR | Status: AC
  Administered 2023-11-30: 9 mL via INTRADERMAL
  Filled 2023-11-30: qty 9

## 2023-11-30 MED ORDER — HEPARIN (PORCINE) 25000 UT/250ML-% IV SOLN
1900.0000 [IU]/h | INTRAVENOUS | Status: DC
  Administered 2023-12-01: 1850 [IU]/h via INTRAVENOUS
  Filled 2023-11-30: qty 250

## 2023-11-30 MED ORDER — GLUCAGON HCL RDNA (DIAGNOSTIC) 1 MG IJ SOLR
INTRAMUSCULAR | Status: AC
  Filled 2023-11-30: qty 1

## 2023-11-30 MED ORDER — FENTANYL CITRATE (PF) 100 MCG/2ML IJ SOLN
INTRAMUSCULAR | Status: AC
  Filled 2023-11-30: qty 2

## 2023-11-30 MED ORDER — CEFAZOLIN SODIUM-DEXTROSE 2-4 GM/100ML-% IV SOLN
2.0000 g | INTRAVENOUS | Status: AC
  Filled 2023-11-30: qty 100

## 2023-11-30 MED ORDER — DEXTROSE 5 % IV SOLN: INTRAVENOUS | Status: DC

## 2023-11-30 MED ORDER — MIDAZOLAM HCL 2 MG/2ML IJ SOLN
INTRAMUSCULAR | Status: AC
  Filled 2023-11-30: qty 2

## 2023-11-30 MED ORDER — MIDAZOLAM HCL 2 MG/2ML IJ SOLN
INTRAMUSCULAR | Status: AC | PRN
  Administered 2023-11-30 (×2): .5 mg via INTRAVENOUS

## 2023-11-30 MED ORDER — ADULT MULTIVITAMIN W/MINERALS CH
1.0000 | ORAL_TABLET | Freq: Every day | ORAL | Status: DC
  Administered 2023-12-01: 1
  Filled 2023-11-30: qty 1

## 2023-11-30 MED ORDER — FREE WATER
100.0000 mL | Status: DC
  Administered 2023-12-01 – 2023-12-02 (×6): 100 mL

## 2023-11-30 MED ORDER — LIDOCAINE HCL 1 % IJ SOLN
INTRAMUSCULAR | Status: AC
  Filled 2023-11-30: qty 20

## 2023-11-30 MED ORDER — PIVOT 1.5 CAL PO LIQD: 1000.0000 mL | ORAL | Status: DC

## 2023-11-30 MED ORDER — OSMOLITE 1.2 CAL PO LIQD
1000.0000 mL | ORAL | Status: DC
Start: 2023-12-01 — End: 2023-12-02
  Administered 2023-12-01: 1000 mL

## 2023-11-30 MED ORDER — AMIODARONE HCL 200 MG PO TABS
200.0000 mg | ORAL_TABLET | Freq: Two times a day (BID) | ORAL | Status: DC
  Administered 2023-11-30 – 2023-12-01 (×2): 200 mg
  Filled 2023-11-30 (×2): qty 1

## 2023-11-30 NOTE — Consult Note (Cosign Needed Addendum)
PHARMACY CONSULT NOTE - ELECTROLYTES  Pharmacy Consult for Electrolyte Monitoring and Replacement   Recent Labs: Height: 5\' 3"  (160 cm) Weight: 76 kg (167 lb 8.8 oz) IBW/kg (Calculated) : 52.4 Estimated Creatinine Clearance: 16.9 mL/min (A) (by C-G formula based on SCr of 3.03 mg/dL (H)).  Potassium (mmol/L)  Date Value  11/30/2023 4.6   Magnesium (mg/dL)  Date Value  08/65/7846 2.4   Calcium (mg/dL)  Date Value  96/29/5284 9.2   Albumin (g/dL)  Date Value  13/24/4010 2.5 (L)   Phosphorus (mg/dL)  Date Value  27/25/3664 6.3 (H)   Sodium (mmol/L)  Date Value  11/30/2023 148 (H)  09/12/2023 142   Assessment  Tonya Cummings is a 72 y.o. female with a PMH significant for CKD stage IV, heart failure with preserved EF, hypertension, rheumatoid arthritis, OSA not on CPAP. Pharmacy has been consulted to monitor and replace electrolytes.  Diet: NPO MIVF: D5 @ 50 mL/hr Pertinent medications:   Goal of Therapy: Electrolytes WNL  Plan:  No electrolyte replacement indicated at this time  F/u Phos and Mag levels ordered 0500 and 1700  Nephrology following; currently no indication for dialysis  Check BMP, Mg, Phos with AM labs  Thank you for allowing pharmacy to be a part of this patient's care.  Littie Deeds, PharmD Pharmacy Resident  11/30/2023 4:47 PM

## 2023-11-30 NOTE — Care Management Important Message (Signed)
Important Message  Patient Details  Name: Tonya Cummings MRN: [id number] Date of Birth: 1951/03/20   Important Message Given:  Yes - Medicare IM     Adrien Shankar, Stephan Minister 11/30/2023, 3:12 PM

## 2023-11-30 NOTE — Progress Notes (Signed)
Date of Admission:  11/18/2023      ID: Tonya Cummings is a 72 y.o. female Principal Problem:   Periprosthetic fracture around internal prosthetic right shoulder joint Active Problems:   Hypertension   Seropositive rheumatoid arthritis (HCC)   Acute respiratory failure with hypoxia (HCC)   Septic arthritis (HCC)   Acute on chronic anemia   (HFpEF) heart failure with preserved ejection fraction (HCC)   CKD (chronic kidney disease) stage 4, GFR 15-29 ml/min (HCC)   ILD (interstitial lung disease) (HCC)   Tension pneumothorax   Cardiac arrest, cause unspecified (HCC)   Atrial fibrillation with RVR (HCC)   Atrial flutter (HCC)    Subjective: Pt has gone for PEG  Medications:   aspirin  325 mg Oral Daily   Chlorhexidine Gluconate Cloth  6 each Topical Daily   lidocaine  1 patch Transdermal Q24H   metoprolol tartrate  25 mg Oral BID   mupirocin ointment   Topical BID   pramipexole  0.125 mg Oral q morning   And   pramipexole  0.25 mg Oral QHS   sodium chloride flush  3 mL Intravenous Q12H   sodium chloride flush  3 mL Intravenous Q12H    Objective: Vital signs in last 24 hours: Patient Vitals for the past 24 hrs:  BP Temp Temp src Pulse Resp SpO2 Weight  11/30/23 1148 (!) 116/101 (!) 97.3 F (36.3 C) -- 93 -- -- --  11/30/23 0743 123/71 (!) 97.5 F (36.4 C) -- 73 16 -- --  11/30/23 0730 -- -- -- 77 20 98 % --  11/30/23 0700 -- -- -- 64 (!) 22 100 % --  11/30/23 0630 -- -- -- 92 (!) 22 100 % --  11/30/23 0600 -- -- -- -- (!) 24 100 % --  11/30/23 0500 -- -- -- -- (!) 22 100 % 76 kg  11/30/23 0426 -- -- -- -- (!) 22 99 % --  11/30/23 0422 (!) 128/58 97.7 F (36.5 C) Oral -- 20 99 % --  11/30/23 0400 -- -- -- -- (!) 24 100 % --  11/30/23 0337 137/72 (!) 97.5 F (36.4 C) -- 74 (!) 22 100 % --  11/30/23 0300 -- -- -- -- 20 -- --  11/30/23 0200 -- -- -- -- (!) 24 99 % --  11/30/23 0000 -- -- -- -- (!) 22 99 % --  11/29/23 2304 (!) 149/73 97.8 F (36.6 C) -- 99 19  96 % --  11/29/23 2200 -- -- -- -- 20 -- --  11/29/23 2000 137/87 97.9 F (36.6 C) Oral 92 (!) 22 97 % --  11/29/23 1632 -- -- -- -- -- 97 % --  11/29/23 1630 -- -- -- 78 -- 96 % --  11/29/23 1629 133/73 97.8 F (36.6 C) Oral 73 16 (!) 89 % --  11/29/23 1500 (!) 136/59 -- -- 76 13 99 % --  11/29/23 1400 124/62 -- -- 84 16 100 % --    Lab Results    Latest Ref Rng & Units 11/30/2023    7:27 AM 11/29/2023    4:29 AM 11/28/2023    4:30 AM  CBC  WBC 4.0 - 10.5 K/uL 17.6  15.5  14.1   Hemoglobin 12.0 - 15.0 g/dL 7.9  7.8  8.3   Hematocrit 36.0 - 46.0 % 26.1  25.1  26.8   Platelets 150 - 400 K/uL 418  419  413  Latest Ref Rng & Units 11/30/2023    7:27 AM 11/29/2023    4:29 AM 11/28/2023    4:30 AM  CMP  Glucose 70 - 99 mg/dL 938  182  993   BUN 8 - 23 mg/dL 85  81  78   Creatinine 0.44 - 1.00 mg/dL 7.16  9.67  8.93   Sodium 135 - 145 mmol/L 148  149  145   Potassium 3.5 - 5.1 mmol/L 4.6  4.3  3.9   Chloride 98 - 111 mmol/L 112  111  107   CO2 22 - 32 mmol/L 21  23  25    Calcium 8.9 - 10.3 mg/dL 9.2  9.6  9.7       Microbiology: Surgical culutres neg so far Studies/Results: CT ABDOMEN PELVIS WO CONTRAST Result Date: 11/30/2023 CLINICAL DATA:  Dysphagia. Evaluate anatomy for gastrostomy tube placement. EXAM: CT ABDOMEN AND PELVIS WITHOUT CONTRAST TECHNIQUE: Multidetector CT imaging of the abdomen and pelvis was performed following the standard protocol without IV contrast. RADIATION DOSE REDUCTION: This exam was performed according to the departmental dose-optimization program which includes automated exposure control, adjustment of the mA and/or kV according to patient size and/or use of iterative reconstruction technique. COMPARISON:  05/28/2023 and chest CT 07/15/2023 FINDINGS: Lower chest: Bilateral pleural effusions, right side larger than left. Patchy densities in the right lower lung. Infection cannot be excluded. Hepatobiliary: Gallbladder appears to be surgically  absent. No focal liver abnormality. Common bile duct is prominent measuring 1.1 cm and measured approximately 0.7 cm on 05/28/2023. Pancreas: Unremarkable. No pancreatic ductal dilatation or surrounding inflammatory changes. Spleen: Normal in size without focal abnormality. Adrenals/Urinary Tract: Normal adrenal glands. Normal appearance of the left kidney without stones or hydronephrosis. Again noted is a central right renal mass there is poorly defined on this study without intravascular contrast. Mass roughly measures 5.5 x 4.4 cm. Again noted is a slightly hyperdense exophytic structure in the right kidney lower pole. Foley catheter in the urinary bladder. Stomach/Bowel: Sigmoid colon extends into the upper abdomen and anterior to the distal stomach. Oral contrast within the colon. Moderate sized hiatal hernia with the stomach body posterior to the sigmoid colon and there are small bowel loops along the left side of the distal gastric body. No evidence for bowel dilatation or obstruction. No focal bowel inflammation. Vascular/Lymphatic: Aortic atherosclerosis. No enlarged abdominal or pelvic lymph nodes. Reproductive: Uterus and bilateral adnexa are unremarkable. Other: Moderate sized hiatal hernia. Spleen may be slightly pulled into the hiatal hernia as well and this is similar to the previous CT examination. No evidence for ascites. Presacral edema. Diffuse subcutaneous edema. Musculoskeletal: Postsurgical changes in the pelvis with surgical plate and screw fixation of the pubic symphysis. Again noted is a surgical screw extending through bilateral SI joints and surgical screw in the left ilium. Markedly displaced fractures of the right fifth, sixth and seventh ribs. Also evidence for acute fractures involving the right eighth and ninth ribs. Patient has evidence of old right rib fractures. New left fractures including a displaced lateral left fifth rib fracture. Old vertebral body compression fractures at L1,  T11 and T6. IMPRESSION: 1. Moderate sized hiatal hernia with colon anterior to the distal stomach. 2. Bilateral pleural effusions, right side greater than left. Patchy densities at the right lung base. Findings could be associated with atelectasis or infection. 3. Interval enlargement of common bile duct measuring up to 1.1 cm. Recommend correlation with liver function tests. 4. Right renal mass. This has  been present on previous imaging examinations. 5. Subcutaneous edema and presacral edema. Findings may be associated with anasarca. 6. New displaced bilateral rib fractures. Electronically Signed   By: Richarda Overlie M.D.   On: 11/30/2023 10:26     Assessment/Plan: Rt  shoulder reverse arthroplasty Prosthetic joint infection Explanation of hardware on 11/18/23 Multiple cultures sent Candida albicans in 2 cultures identified today  Pt on daptomycin and ceftriaxone- will add micafungin as fluconazole with Amiodarone increased risk for QT prolongation   Cardiac arrest secondary to acute resp failure Pt extubated 2 d echo No RV strain   Tension Pneumothorax post CPR Rt chest drain has been removed   Iron def anemia   CKD   Rheumatoid arthritis   Rt renal mass .   Discussed the management with care team

## 2023-11-30 NOTE — Progress Notes (Signed)
PT Cancellation Note  Patient Details Name: Tonya Cummings MRN: [id number] DOB: 1951-07-21   Cancelled Treatment:    Reason Eval/Treat Not Completed: Patient at procedure or test/unavailable Patient with transport in room, leaving to go to get PEG placement. Will re-attempt at later time/date.  Forestine Macho 11/30/2023, 1:59 PM

## 2023-11-30 NOTE — Progress Notes (Addendum)
Pharmacy Antibiotic Note  Tonya Cummings is a 72 y.o. female admitted on 11/18/2023 with  septic arthritis  following shoulder arthroplasty.  Pharmacy has been consulted for Daptomycin dosing. PMH of Rheumatoid arthritis-on hydroxychloroquine (Prolia every 6 months)  Today, 11/30/2023 Day 12 daptomycin/ceftriaxone Renal: H/o CKD, SCr 3.03 WBC trending up, 17.6 Afebrile OR 11/18/23  Periprosthetic infection with explant of glenoid and humeral components, Right humerus insertion of antibiotic deliver device 12/7 CK 291 (following OR) 12/11 CK 35 12/18 CK 96 On rosuvastatin 5mg  every OTHER day - stopped on 12/10 12/6 OR cultures: two tissue cultures from humerus with C. albicans  holding x 21 days due to increased risk for propionibacterium with shoulder arthroplasty  Plan: Continue Daptomycin to 700mg  (9.2 mg/kg) IV q48h for CrCL <56ml/min Continue ceftriaxone 2gm IV q24h as ordered by physician  Discuss with ID regarding C albicans now on cultures from humerus Follow CK at least weekly  Follow cultures Follow renal function    Height: 5\' 3"  (160 cm) Weight: 76 kg (167 lb 8.8 oz) IBW/kg (Calculated) : 52.4  Temp (24hrs), Avg:97.8 F (36.6 C), Min:97.5 F (36.4 C), Max:98.6 F (37 C)  Recent Labs  Lab 11/26/23 0434 11/27/23 0322 11/28/23 0430 11/29/23 0429 11/30/23 0727  WBC 10.8* 12.6* 14.1* 15.5* 17.6*  CREATININE 2.58* 2.52* 2.52* 2.80* 3.03*    Estimated Creatinine Clearance: 16.9 mL/min (A) (by C-G formula based on SCr of 3.03 mg/dL (H)).    Allergies  Allergen Reactions   Gabapentin Other (See Comments)    Weight gain   Ibuprofen Other (See Comments)    Headache    Antimicrobials this admission: Vanc 2 gm mixed w/ cement, gentamicin 80 mg mixed w/ cement, cefazolin 2gm x 1 Dapto 12/6 >>   Ceftriaxone 12/6 >>  Dose adjustments this admission:    Microbiology results: 12/6 BCx: NG-final   Thank you for allowing pharmacy to be a part of this  patient's care.  Juliette Alcide, PharmD, BCPS, BCIDP Work Cell: [phone] 11/30/2023 10:18 AM

## 2023-11-30 NOTE — Progress Notes (Signed)
[facility]  ROUNDING NOTE   Subjective:   Tonya Cummings is a 72  y.o. female with past medical conditions including chronic kidney disease stage IV, proteinuria, hypertension, anemia of chronic kidney disease, RA, OSA and secondary hyperparathyroidism. Patient presented to the hospital for planned right shoulder arthroplasty which was later complicated by PEA cardiac arrest.  Patient is known to our practice and is followed outpatient by Dr. Cherylann Ratel.  Patient is seen and evaluated at bedside in ICU.    Patient transferred from ICU yesterday IV heparin, amiodarone infusing. IV fluids at 50 cc/h Adequate urine output noted in Foley, 1.1 L. Remains on 4 L nasal cannula  Creatinine 3.03  Objective:  Vital signs in last 24 hours:  Temp:  [97.3 F (36.3 C)-97.9 F (36.6 C)] 97.3 F (36.3 C) (12/18 1148) Pulse Rate:  [64-99] 93 (12/18 1148) Resp:  [13-24] 16 (12/18 0743) BP: (116-149)/(58-101) 116/101 (12/18 1148) SpO2:  [89 %-100 %] 98 % (12/18 0730) Weight:  [76 kg] 76 kg (12/18 0500)  Weight change: 0.1 kg Filed Weights   11/28/23 0400 11/29/23 0449 11/30/23 0500  Weight: 73.1 kg 75.9 kg 76 kg    Intake/Output: I/O last 3 completed shifts: In: 2909 [I.V.:2747.2; IV Piggyback:161.8] Out: 1490 [Urine:1490]   Intake/Output this shift:  Total I/O In: 70.3 [I.V.:70.3] Out: -   Physical Exam: General: Ill appearing, laying in bed  Head: Normocephalic, atraumatic. Moist oral mucosal membranes  Eyes: Anicteric  Lungs:  Coarse breath sounds, [place] O2  Heart: irregular, atrial fibrillation/flutter  Abdomen:  Soft, nontender  Extremities:  No peripheral edema.  Neurologic: Resting quietly.  Skin: No lesions       Basic Metabolic Panel: Recent Labs  Lab 11/24/23 0448 11/25/23 0152 11/26/23 0434 11/27/23 0322 11/28/23 0430 11/29/23 0429 11/30/23 0727  NA 137 137 143 144 145 149* 148*  K 4.5 4.6 4.6 4.3 3.9 4.3 4.6  CL 104 103 107 106 107 111 112*   CO2 25 20* 22 24 25 23  21*  GLUCOSE 146* 92 92 119* 111* 110* 102*  BUN 84* 90* 83* 83* 78* 81* 85*  CREATININE 2.61* 2.74* 2.58* 2.52* 2.52* 2.80* 3.03*  CALCIUM 8.5* 8.9 9.7 9.2 9.7 9.6 9.2  MG 2.1 2.1 2.4  --  2.5*  --  2.5*  PHOS 3.4 5.0* 4.8* 5.0* 4.7* 5.0* 5.8*    Liver Function Tests: Recent Labs  Lab 11/26/23 0434 11/27/23 0322 11/28/23 0430 11/29/23 0429 11/30/23 0727  ALBUMIN 2.4* 2.3* 2.7* 2.6* 2.5*   No results for input(s): "LIPASE", "AMYLASE" in the last 168 hours. No results for input(s): "AMMONIA" in the last 168 hours.  CBC: Recent Labs  Lab 11/26/23 0434 11/27/23 0322 11/28/23 0430 11/29/23 0429 11/30/23 0727  WBC 10.8* 12.6* 14.1* 15.5* 17.6*  HGB 8.7* 7.9* 8.3* 7.8* 7.9*  HCT 28.7* 26.4* 26.8* 25.1* 26.1*  MCV 91.1 92.6 89.9 91.6 91.3  PLT 344 374 413* 419* 418*    Cardiac Enzymes: Recent Labs  Lab 11/30/23 0727  CKTOTAL 96    BNP: Invalid input(s): "POCBNP"  CBG: Recent Labs  Lab 11/29/23 1801 11/30/23 0123 11/30/23 0603 11/30/23 0747 11/30/23 1017  GLUCAP 90 101* 76 69* 95    Microbiology: Results for orders placed or performed during the hospital encounter of 11/18/23  Surgical pcr screen     Status: Abnormal   Collection Time: 11/18/23 12:11 PM   Specimen: Nasal Mucosa; Nasal Swab  Result Value Ref Range Status   MRSA,  PCR NEGATIVE NEGATIVE Final   Staphylococcus aureus POSITIVE (A) NEGATIVE Final    Comment: (NOTE) The Xpert SA Assay (FDA approved for NASAL specimens in patients 56 years of age and older), is one component of a comprehensive surveillance program. It is not intended to diagnose infection nor to guide or monitor treatment. Performed at [facility], [address] [place], [place] [address]   Aerobic/Anaerobic Culture w Gram Stain (surgical/deep wound)     Status: Abnormal (Preliminary result)   Collection Time: 11/18/23  2:29 PM   Specimen: Path Tissue  Result Value Ref Range Status    Specimen Description   Final    TISSUE Performed at [facility], [address] [place], [place] [address]    Special Requests RT HUMERUS HOLD 21DAYS  Final   Gram Stain   Final    RARE WBC PRESENT,BOTH PMN AND MONONUCLEAR NO ORGANISMS SEEN    Culture (A)  Final    CANDIDA ALBICANS CONTINUING TO HOLD Performed at [facility], [address], [place], [place] [address]    Report Status PENDING  Incomplete  Aerobic/Anaerobic Culture w Gram Stain (surgical/deep wound)     Status: None (Preliminary result)   Collection Time: 11/18/23  2:43 PM   Specimen: Path Tissue  Result Value Ref Range Status   Specimen Description   Final    TISSUE Performed at [facility], [address] [place], [place] [address]    Special Requests TUBEROSITY,HOLD 21DAYS  Final   Gram Stain   Final    FEW WBC PRESENT,BOTH PMN AND MONONUCLEAR NO ORGANISMS SEEN    Culture   Final    NO GROWTH 12 DAYS CONTINUING TO HOLD Performed at [facility], [address], [place], [place] [address]    Report Status PENDING  Incomplete  Aerobic/Anaerobic Culture w Gram Stain (surgical/deep wound)     Status: Abnormal (Preliminary result)   Collection Time: 11/18/23  2:52 PM   Specimen: Path Tissue  Result Value Ref Range Status   Specimen Description   Final    TISSUE Performed at [facility], [address] [place], [place] [address]    Special Requests RT HUMERAL TRAY HOLD 21 DAYS  Final   Gram Stain   Final    RARE WBC PRESENT, PREDOMINANTLY MONONUCLEAR NO ORGANISMS SEEN    Culture (A)  Final    CANDIDA ALBICANS CONTINUING TO HOLD Performed at [facility], [address], [place], [place] [address]    Report Status PENDING  Incomplete  Aerobic/Anaerobic Culture w Gram Stain (surgical/deep wound)     Status: None (Preliminary result)   Collection Time: 11/18/23  2:58 PM   Specimen: Path Tissue  Result Value Ref Range Status   Specimen Description    Final    TISSUE Performed at [facility], [address] [place], [place] [address]    Special Requests GLENOSPHERE,HOLD 21 DAYS  Final   Gram Stain   Final    RARE WBC PRESENT,BOTH PMN AND MONONUCLEAR NO ORGANISMS SEEN    Culture   Final    NO GROWTH 12 DAYS CONTINUING TO HOLD Performed at [facility], [address], [place], [place] [address]    Report Status PENDING  Incomplete  Aerobic/Anaerobic Culture w Gram Stain (surgical/deep wound)     Status: None (Preliminary result)   Collection Time: 11/18/23  3:06 PM   Specimen: Path Tissue  Result Value  Ref Range Status   Specimen Description   Final    TISSUE Performed at [facility], [address] [place], [place] [address]    Special Requests RT HUMEREUS CANAL HOLD 21DAYS  Final   Gram Stain   Final    FEW WBC PRESENT, PREDOMINANTLY MONONUCLEAR NO ORGANISMS SEEN    Culture   Final    NO GROWTH 12 DAYS CONTINUING TO HOLD Performed at [facility], [address] [place], [place] [address]    Report Status PENDING  Incomplete  Culture, blood (Routine X 2) w Reflex to ID Panel     Status: None   Collection Time: 11/18/23  7:22 PM   Specimen: BLOOD  Result Value Ref Range Status   Specimen Description BLOOD BLOOD LEFT HAND  Final   Special Requests   Final    BOTTLES DRAWN AEROBIC AND ANAEROBIC Blood Culture adequate volume   Culture   Final    NO GROWTH 5 DAYS Performed at [facility], [address] [place], [place] [address]    Report Status 11/23/2023 FINAL  Final  Culture, blood (Routine X 2) w Reflex to ID Panel     Status: None   Collection Time: 11/18/23  7:28 PM   Specimen: BLOOD  Result Value Ref Range Status   Specimen Description BLOOD BLOOD LEFT ARM  Final   Special Requests   Final    BOTTLES DRAWN AEROBIC AND ANAEROBIC Blood Culture adequate volume   Culture   Final    NO GROWTH 5 DAYS Performed at [facility], [address],  [place], [place] [address]    Report Status 11/23/2023 FINAL  Final  MRSA Next Gen by PCR, Nasal     Status: None   Collection Time: 11/19/23  1:57 PM   Specimen: Nasal Mucosa; Nasal Swab  Result Value Ref Range Status   MRSA by PCR Next Gen NOT DETECTED NOT DETECTED Final    Comment: (NOTE) The GeneXpert MRSA Assay (FDA approved for NASAL specimens only), is one component of a comprehensive MRSA colonization surveillance program. It is not intended to diagnose MRSA infection nor to guide or monitor treatment for MRSA infections. Test performance is not FDA approved in patients less than 53 years old. Performed at [facility], [address] [place], [place] [address]   Culture, Respiratory w Gram Stain     Status: None   Collection Time: 11/21/23  3:18 PM   Specimen: Tracheal Aspirate; Respiratory  Result Value Ref Range Status   Specimen Description   Final    TRACHEAL ASPIRATE Performed at [facility], [address] [place], [place] [address]    Special Requests   Final    NONE Performed at [facility], [address] [place], [place] [address]    Gram Stain   Final    ABUNDANT WBC PRESENT, PREDOMINANTLY PMN RARE GRAM POSITIVE COCCI IN PAIRS RARE GRAM NEGATIVE RODS    Culture   Final    FEW Normal respiratory flora-no Staph aureus or Pseudomonas seen Performed at [facility], [address], [place], [place] [address]    Report Status 11/23/2023 FINAL  Final    Coagulation Studies: No results for input(s): "LABPROT", "INR" in the last 72 hours.   Urinalysis: No results for input(s): "COLORURINE", "LABSPEC", "PHURINE", "GLUCOSEU", "HGBUR", "BILIRUBINUR", "KETONESUR", "PROTEINUR", "UROBILINOGEN", "NITRITE", "LEUKOCYTESUR" in the last 72 hours.  Invalid input(s): "APPERANCEUR"    Imaging: CT  ABDOMEN PELVIS WO CONTRAST Result Date: 11/30/2023 CLINICAL DATA:  Dysphagia. Evaluate anatomy for gastrostomy tube placement. EXAM: CT  ABDOMEN AND PELVIS WITHOUT CONTRAST TECHNIQUE: Multidetector CT imaging of the abdomen and pelvis was performed following the standard protocol without IV contrast. RADIATION DOSE REDUCTION: This exam was performed according to the departmental dose-optimization program which includes automated exposure control, adjustment of the mA and/or kV according to patient size and/or use of iterative reconstruction technique. COMPARISON:  05/28/2023 and chest CT 07/15/2023 FINDINGS: Lower chest: Bilateral pleural effusions, right side larger than left. Patchy densities in the right lower lung. Infection cannot be excluded. Hepatobiliary: Gallbladder appears to be surgically absent. No focal liver abnormality. Common bile duct is prominent measuring 1.1 cm and measured approximately 0.7 cm on 05/28/2023. Pancreas: Unremarkable. No pancreatic ductal dilatation or surrounding inflammatory changes. Spleen: Normal in size without focal abnormality. Adrenals/Urinary Tract: Normal adrenal glands. Normal appearance of the left kidney without stones or hydronephrosis. Again noted is a central right renal mass there is poorly defined on this study without intravascular contrast. Mass roughly measures 5.5 x 4.4 cm. Again noted is a slightly hyperdense exophytic structure in the right kidney lower pole. Foley catheter in the urinary bladder. Stomach/Bowel: Sigmoid colon extends into the upper abdomen and anterior to the distal stomach. Oral contrast within the colon. Moderate sized hiatal hernia with the stomach body posterior to the sigmoid colon and there are small bowel loops along the left side of the distal gastric body. No evidence for bowel dilatation or obstruction. No focal bowel inflammation. Vascular/Lymphatic: Aortic atherosclerosis. No enlarged abdominal or pelvic lymph nodes. Reproductive: Uterus and bilateral adnexa are unremarkable. Other: Moderate sized hiatal hernia. Spleen may be slightly pulled into the hiatal hernia  as well and this is similar to the previous CT examination. No evidence for ascites. Presacral edema. Diffuse subcutaneous edema. Musculoskeletal: Postsurgical changes in the pelvis with surgical plate and screw fixation of the pubic symphysis. Again noted is a surgical screw extending through bilateral SI joints and surgical screw in the left ilium. Markedly displaced fractures of the right fifth, sixth and seventh ribs. Also evidence for acute fractures involving the right eighth and ninth ribs. Patient has evidence of old right rib fractures. New left fractures including a displaced lateral left fifth rib fracture. Old vertebral body compression fractures at L1, T11 and T6. IMPRESSION: 1. Moderate sized hiatal hernia with colon anterior to the distal stomach. 2. Bilateral pleural effusions, right side greater than left. Patchy densities at the right lung base. Findings could be associated with atelectasis or infection. 3. Interval enlargement of common bile duct measuring up to 1.1 cm. Recommend correlation with liver function tests. 4. Right renal mass. This has been present on previous imaging examinations. 5. Subcutaneous edema and presacral edema. Findings may be associated with anasarca. 6. New displaced bilateral rib fractures. Electronically Signed   By: Richarda Overlie M.D.   On: 11/30/2023 10:26      Medications:    amiodarone 30 mg/hr (11/30/23 0733)    ceFAZolin (ANCEF) IV     cefTRIAXone (ROCEPHIN)  IV Stopped (11/29/23 1925)   DAPTOmycin Stopped (11/28/23 1428)   dextrose 50 mL/hr at 11/30/23 0829   heparin 1,850 Units/hr (11/30/23 0801)   micafungin (MYCAMINE) 100 mg in sodium chloride 0.9 % 100 mL IVPB      aspirin  325 mg Oral Daily   Chlorhexidine Gluconate Cloth  6 each Topical Daily   lidocaine  1 patch Transdermal Q24H  metoprolol tartrate  25 mg Oral BID   mupirocin ointment   Topical BID   pramipexole  0.125 mg Oral q morning   And   pramipexole  0.25 mg Oral QHS   sodium  chloride flush  3 mL Intravenous Q12H   sodium chloride flush  3 mL Intravenous Q12H   bisacodyl, ipratropium-albuterol, metoprolol tartrate, morphine injection, mouth rinse, mouth rinse, polyethylene glycol, senna-docusate  Assessment/ Plan:  Tonya Cummings is a 72 y.o.  female with past medical conditions including chronic kidney disease stage IV, proteinuria, hypertension, anemia of chronic kidney disease, RA, OSA and secondary hyperparathyroidism. Patient presented to the hospital for planned right shoulder arthroplasty which was later complicated by PEA cardiac arrest.  She is currently admitted for Periprosthetic fracture around internal prosthetic right shoulder joint [M97.31XA] Septic arthritis (HCC) [M00.9]   Acute Kidney Injury on chronic kidney disease stage 4 with baseline creatinine 2.08 on 10/04/2023.  Acute kidney injury secondary to ATN from hypoperfusion.  Chronic kidney disease secondary to hypertension and rheumatoid arthritis with prior NSAID use.  No IV contrast exposure.    -As creatinine continues to rise slowly.  Adequate urine output noted, 1.1L in preceding 24 hours.  Foley catheter remains in place.  No immediate indication for dialysis.  Will continue to monitor.   Lab Results  Component Value Date   CREATININE 3.03 (H) 11/30/2023   CREATININE 2.80 (H) 11/29/2023   CREATININE 2.52 (H) 11/28/2023    Intake/Output Summary (Last 24 hours) at 11/30/2023 1224 Last data filed at 11/30/2023 0801 Gross per 24 hour  Intake 2026.48 ml  Output 900 ml  Net 1126.48 ml   2. Anemia of chronic kidney disease with acute blood loss Lab Results  Component Value Date   HGB 7.9 (L) 11/30/2023  Patient has received blood transfusions during this admission.  Hemoglobin remains decreased but stable.   3. Secondary Hyperparathyroidism: with outpatient labs: PTH 45, phosphorus 4.7, calcium 9.4 on 11/07/23.   Lab Results  Component Value Date   CALCIUM 9.2 11/30/2023    PHOS 5.8 (H) 11/30/2023    Will continue to monitor bone minerals during this admission.  Continue calcitriol.  4.  Right reverse shoulder arthroplasty loosening after periprosthetic fracture with a right periprosthetic infection and humerus fractures.  Patient underwent right reverse shoulder arthroplasty explant of glenoid and humeral components and open treatment of right humerus fracture with intramedullary device.  This was done on 11/18/2023. Currently receiving IV ceftriaxone and IV daptomycin.     LOS: 12 Devlyn Retter 12/18/202412:24 PM

## 2023-11-30 NOTE — Progress Notes (Signed)
Contacted Tonya Billings, NP of confusion and taking off safety mittens and attempts to pull at Lines/gown/equipment, etc. Concerned about her pulling at her PEG tube that was placed today or her IV  and HD lines. Request a telesitter. See new order.

## 2023-11-30 NOTE — Procedures (Signed)
Interventional Radiology Procedure Note  Procedure: Placement of percutaneous 20F pull-through gastrostomy tube. Complications: None Recommendations: - NPO except for sips and chips remainder of today and overnight - Maintain G-tube to LWS until tomorrow morning  - May advance diet as tolerated and begin using tube tomorrow morning  Signed,  Jonmarc Bodkin K. Merle Cirelli, MD   

## 2023-11-30 NOTE — Consult Note (Signed)
PHARMACY - ANTICOAGULATION CONSULT NOTE  Pharmacy Consult for IV Heparin Indication: atrial fibrillation  Patient Measurements: Height: 5\' 3"  (160 cm) Weight: 76 kg (167 lb 8.8 oz) IBW/kg (Calculated) : 52.4 Heparin Dosing Weight: 70 kg  Labs: Recent Labs    11/28/23 0430 11/29/23 0429 11/29/23 1938 11/30/23 0727  HGB 8.3* 7.8*  --  7.9*  HCT 26.8* 25.1*  --  26.1*  PLT 413* 419*  --  418*  HEPARINUNFRC 0.34 0.19* 0.57 0.34  CREATININE 2.52* 2.80*  --   --   CKTOTAL  --   --   --  96   Estimated Creatinine Clearance: 18.2 mL/min (A) (by C-G formula based on SCr of 2.8 mg/dL (H)).  Medical History: Past Medical History:  Diagnosis Date   Acute hypoxemic respiratory failure (HCC)    Anemia in stage 4 chronic kidney disease (HCC)    Anginal pain (HCC)    Anxiety    Aortic atherosclerosis (HCC)    CHF (congestive heart failure) (HCC)    a.) TTE 09/30/2021: EF 55-60%, no RWMAs, mild MR, G1DD   Chronic pain    Chronic radicular pain of lower back    CKD (chronic kidney disease), stage IV (HCC)    Complication of anesthesia    a.) delayed emergence following ureteroscopy 07/2023   Coronary artery calcification seen on CT scan    Costochondritis    DDD (degenerative disc disease), lumbar    Depression    Dyspnea    GERD (gastroesophageal reflux disease)    Hiatal hernia    Hip dysplasia    Hyperlipidemia    Hypertension    Insomnia    Iron deficiency    Iron deficiency anemia    Low back pain    Low vitamin B12 level    Lumbar facet joint pain    Migraines    Nose colonized with MRSA 10/03/2020   a.) preop PCR (+) 10/03/2020 prior to RIGHT TKA; b.) preop PCR (+) 08/25/2023 prior to RIGHT REVERSE SHOULDER ARTHROPLASTY; BICEPS TENODESIS   Numbness and tingling of right leg    OA (osteoarthritis)    Obesity    OSA (obstructive sleep apnea)    a.) not currently utilizing nocturnal PAP therapy; positional. PCCM feels as if symptom can be mitigated with  diet/exercise/weight loss unless develops significant symptoms.   Osteoporosis    a.) recieves denosumab injections   Pelvic fracture (HCC)    Pneumonia    Pulmonary fibrosis (HCC)    Raynaud's disease without gangrene    Restless leg syndrome    a.) on pramipexole + oral Fe supplementation   Rheumatoid arthritis (HCC)    a.) Tx'd with hydroxychloroquine   Right renal mass 05/28/2023   a.) CT renal 05/28/2023:  5.7 cm mass in the medial aspect of right kidney   Seasonal allergies    Thoracic compression fracture (HCC)    Medications:  No anticoagulation prior to admission per my chart review  Assessment: 72 y/o F with a past medical history of CKD IV, HFpEF, hypertension, RA, OSA not on CPAP who came to [facility] 12/6 for right reverse shoulder arthroplasty after for suspicion of septic joint. Patient experienced cardiac arrest post-operatively on 12/7. She is intubated, sedated and on mechanical ventilation in the ICU. Hospital course now further complicated by paroxysmal Afib with RVR. Patient has been placed on amiodarone. Pharmacy consulted for heparin.   12/10 20:29 HL <0.10 12/11 0544 HL 0.13, subtherapeutic 12/11 1539 HL 0.29, subtherapeutic 12/12  0039 HL 0.34, therapeutic x 1 12/12 0920 HL 0.28, subtherapeutic 12/12 1817 HL 0.42, therapeutic x 1 12/13 0152 HL 0.39, therapeutic x 2 12/14 0434 HL 0.42, therapeutic x 3 12/15 0322 HL 0.31, therapeutic x 4 12/16 0430 HL 0.34, therapeutic x 5 12/17 0429 HL 0.19, SUBtherapeutic  12/17 1938 HL 0.57, therapeutic x 1 12/18 0727 HL 0.34   Goal of Therapy:  Heparin level 0.3-0.7 units/ml Monitor platelets by anticoagulation protocol: Yes   Plan:  Heparin level is therapeutic. Will continue heparin infusion at 1850 units/hr. Recheck heparin level and CBC with AM labs.   Ronnald Ramp, PharmD Clinical Pharmacist 11/30/2023 8:04 AM

## 2023-11-30 NOTE — Progress Notes (Signed)
Brief Pharmacy Note  Patient s/p G-tube placement with IR, standard bleeding risk associated w/ procedure. On heparin infusion for Afib. Re-start 6 hours post-procedurally at 2200.  Tressie Ellis 11/30/23

## 2023-11-30 NOTE — Progress Notes (Signed)
Progress Note   Patient: Tonya Cummings GEX:[id number] DOB: [birth year] DOA: 11/18/2023     12 DOS: the patient was seen and examined on 11/30/2023   Brief hospital course:  72 year old female patient with a past medical history of CKD stage IV, heart failure with preserved EF, hypertension, rheumatoid arthritis, OSA not on CPAP presents to [facility] on 12/06 for right reverse shoulder arthroplasty after for suspicion of septic joint. She initially underwent right shoulder arthroplasty on 08/30/2023 after which she developed a.  Periprosthetic fracture status post conversion to a longstem humeral component with ORIF on 10/03/2023.  No evidence of infection at the time. Follow-up lab work showed elevated inflammatory markers including ESR suspicious for an infectious process.  Therefore she presents back on 12/06 for shoulder arthroplasty explant of glenoid and humeral component open treatment of right humerus fracture with intramedullary device and insertion of antibiotic delivery system.  Small area of purulence was noted.  Cultures were sent.  Started on daptomycin and ceftriaxone.   She did well immediately postop however on 12/07 she was getting up to the bedside commode she went into respiratory distress with O2 sat dropping becoming apneic and went into PEA arrest.  CPR for roughly 5 to 6-minute prior to ROSC epi x 2.  Not shockable rhythm.  Patient intubated and transferred to the ICU for further care.  Postintubation chest x-ray with right pneumothorax status post chest tube placement 12/07.  HD catheter placed 12/07 anticipating need for dialysis given worsening kidney function.   During ICU stay patient had circulatory shock requiring pressors.  She has elevated troponin in the setting of cardiac arrest, new onset A-fib.  Echocardiogram 12/08 with normal LVEF 55 to 60%.  RV systolic function is normal and size is normal. Ultrasound venous lower extremity 12/08 negative  for DVT. Patient had pneumothorax 12/7 for which a right chest tube was placed which is removed 12/11.  Her kidney function remained stable and nephrologist followed advised no acute indication for dialysis. She is successfully extubated 12/13 and transferred to [facility].   Assessment and Plan: PEA arrest NSTEMI New onset A-fib: Circulatory shock, off pressors now. She is on heparin gtt. Echo showed EF 55-60%.  She is continued on amiodarone gtt for rapid A-fib per cardiology recommendations.  Heart rate 110-150. Cardiology follow-up appreciated, start Lopressor once able to take p.o., she will need anticoagulant prior to discharge. --cont amio gtt and heparin gtt --start Lopressor when G-tube ready for use  Acute hypoxic respiratory failure Right pneumothorax Status post right-sided chest tube placed 12/7, removed 12/11. Possible aspiration pneumonia Patient is currently on 2 L supplemental oxygen postextubation 12/13. Continue aggressive pulmonary toilet, DuoNebs as needed. Encourage out of bed to chair, incentive spirometry. ----Continue supplemental O2 to keep sats >=92%, wean as tolerated  Acute toxic metabolic encephalopathy Hospital delirium In the setting of PEA arrest requiring airway protection, on sedation. --supportive care  Dysphagia: Hoarseness s/p extubation. SLP evaluation and rec NPO RN unable to place NG as she cannot swallow, patient refused tube placement. --husband elected for G-tube placement --G-tube placement today, can begin using the tube tomorrow  Hypernatremia --due to lack of free water intake --cont MIVF as D5@50  for now  Acute on chronic kidney injury stage 4: Acute kidney injury in the setting of hypoperfusion. Avoid nephrotoxic drugs.   --cont MIVF  Right reverse shoulder arthroplasty 9/17, status post revision surgery on 1021, 11/18/2023- Hardware explanted 12/6.  ID consulted.  --Candida albicans in  2 cultures identified today  --cont  ceftriaxone and dapto --start micafungin, per ID  Anemia of chronic disease: Hemoglobin stable.  No active bleeding. --hold iron, B12 supplements while NPO  Hypoglycemia --due to no oral intake --BG q6h to monitor for hypogylcemia --start tube feed tomorrow  Obesity with BMI 31.40 Contributing to her current condition. OSA not on CPAP. Diet, exercise and weight reduction advised.   Debility and deconditioning: Out of bed to chair, encourage incentive spirometry. PT OT evaluation.    Nursing supportive care. Fall, aspiration precautions. DVT prophylaxis heparin drip   Code Status: Full Code  Subjective:  Pt's speech was difficult to understand, but she appeared to be coherent today and asked about her upcoming G-tube placement.   Physical Exam: Vitals:   11/30/23 1545 11/30/23 1603 11/30/23 1642 11/30/23 1932  BP: (!) 98/45 (!) 109/57 103/87 (!) 82/71  Pulse: 60 61 (!) 58 (!) 59  Resp: (!) 23 (!) 24 16 20   Temp:   97.6 F (36.4 C) 97.8 F (36.6 C)  TempSrc:    Oral  SpO2: 99% 96%  100%  Weight:      Height:        Constitutional: NAD, alert, seemed more coherent today HEENT: conjunctivae and lids normal, EOMI CV: No cyanosis.   RESP: normal respiratory effort SKIN: warm, dry  Foley present   Data Reviewed:      Latest Ref Rng & Units 11/30/2023    7:27 AM 11/29/2023    4:29 AM 11/28/2023    4:30 AM  CBC  WBC 4.0 - 10.5 K/uL 17.6  15.5  14.1   Hemoglobin 12.0 - 15.0 g/dL 7.9  7.8  8.3   Hematocrit 36.0 - 46.0 % 26.1  25.1  26.8   Platelets 150 - 400 K/uL 418  419  413       Latest Ref Rng & Units 11/30/2023    7:27 AM 11/29/2023    4:29 AM 11/28/2023    4:30 AM  BMP  Glucose 70 - 99 mg/dL 154  008  676   BUN 8 - 23 mg/dL 85  81  78   Creatinine 0.44 - 1.00 mg/dL 1.95  0.93  2.67   Sodium 135 - 145 mmol/L 148  149  145   Potassium 3.5 - 5.1 mmol/L 4.6  4.3  3.9   Chloride 98 - 111 mmol/L 112  111  107   CO2 22 - 32 mmol/L 21  23  25     Calcium 8.9 - 10.3 mg/dL 9.2  9.6  9.7    IR GASTROSTOMY TUBE MOD SED Result Date: 11/30/2023 INDICATION: 72 year old female with dysphagia. She presents for percutaneous gastrostomy tube placement. EXAM: Fluoroscopically guided placement of percutaneous pull-through gastrostomy tube Interventional Radiologist:  Sterling Big, MD MEDICATIONS: 2 g Ancef; Antibiotics were administered within 1 hour of the procedure. ANESTHESIA/SEDATION: Versed 1 mg IV; Fentanyl 50 mcg IV administered by the radiology nurse Moderate Sedation Time:  18 minutes The patient's vital signs and level of consciousness were continuously monitored during the procedure by the interventional radiology nurse under my direct supervision. CONTRAST:  10mL OMNIPAQUE IOHEXOL 300 MG/ML  SOLN FLUOROSCOPY: Radiation exposure index: 51 mGy reference air kerma COMPLICATIONS: None immediate. PROCEDURE: Informed written consent was obtained from the patient after a thorough discussion of the procedural risks, benefits and alternatives. All questions were addressed. Maximal Sterile Barrier Technique was utilized including caps, mask, sterile gowns, sterile gloves, sterile drape, hand hygiene and skin  antiseptic. A timeout was performed prior to the initiation of the procedure. Maximal barrier sterile technique utilized including caps, mask, sterile gowns, sterile gloves, large sterile drape, hand hygiene, and chlorhexadine skin prep. An angled catheter was advanced over a wire under fluoroscopic guidance through the nose, down the esophagus and into the body of the stomach. The stomach was then insufflated with several 100 ml of air. Fluoroscopy confirmed location of the gastric bubble, as well as inferior displacement of the barium stained colon. Under direct fluoroscopic guidance, a single T-tack was placed, and the anterior gastric wall drawn up against the anterior abdominal wall. Percutaneous access was then obtained into the mid gastric body  with an 18 gauge sheath needle. Aspiration of air, and injection of contrast material under fluoroscopy confirmed needle placement. An Amplatz wire was advanced in the gastric body and the access needle exchanged for a 9-French vascular sheath. A snare device was advanced through the vascular sheath and an Amplatz wire advanced through the angled catheter. The Amplatz wire was successfully snared and this was pulled up through the esophagus and out the mouth. A 20-French Burnell Blanks MIC-PEG tube was then connected to the snare and pulled through the mouth, down the esophagus, into the stomach and out to the anterior abdominal wall. Hand injection of contrast material confirmed intragastric location. The T-tack retention suture was then cut. The pull through peg tube was then secured with the external bumper and capped. The patient will be observed for several hours with the newly placed tube on low wall suction to evaluate for any post procedure complication. The patient tolerated the procedure well, there is no immediate complication. IMPRESSION: Successful placement of a 20 French pull through gastrostomy tube. Electronically Signed   By: Malachy Moan M.D.   On: 11/30/2023 15:38   CT ABDOMEN PELVIS WO CONTRAST Result Date: 11/30/2023 CLINICAL DATA:  Dysphagia. Evaluate anatomy for gastrostomy tube placement. EXAM: CT ABDOMEN AND PELVIS WITHOUT CONTRAST TECHNIQUE: Multidetector CT imaging of the abdomen and pelvis was performed following the standard protocol without IV contrast. RADIATION DOSE REDUCTION: This exam was performed according to the departmental dose-optimization program which includes automated exposure control, adjustment of the mA and/or kV according to patient size and/or use of iterative reconstruction technique. COMPARISON:  05/28/2023 and chest CT 07/15/2023 FINDINGS: Lower chest: Bilateral pleural effusions, right side larger than left. Patchy densities in the right lower lung.  Infection cannot be excluded. Hepatobiliary: Gallbladder appears to be surgically absent. No focal liver abnormality. Common bile duct is prominent measuring 1.1 cm and measured approximately 0.7 cm on 05/28/2023. Pancreas: Unremarkable. No pancreatic ductal dilatation or surrounding inflammatory changes. Spleen: Normal in size without focal abnormality. Adrenals/Urinary Tract: Normal adrenal glands. Normal appearance of the left kidney without stones or hydronephrosis. Again noted is a central right renal mass there is poorly defined on this study without intravascular contrast. Mass roughly measures 5.5 x 4.4 cm. Again noted is a slightly hyperdense exophytic structure in the right kidney lower pole. Foley catheter in the urinary bladder. Stomach/Bowel: Sigmoid colon extends into the upper abdomen and anterior to the distal stomach. Oral contrast within the colon. Moderate sized hiatal hernia with the stomach body posterior to the sigmoid colon and there are small bowel loops along the left side of the distal gastric body. No evidence for bowel dilatation or obstruction. No focal bowel inflammation. Vascular/Lymphatic: Aortic atherosclerosis. No enlarged abdominal or pelvic lymph nodes. Reproductive: Uterus and bilateral adnexa are unremarkable. Other: Moderate sized  hiatal hernia. Spleen may be slightly pulled into the hiatal hernia as well and this is similar to the previous CT examination. No evidence for ascites. Presacral edema. Diffuse subcutaneous edema. Musculoskeletal: Postsurgical changes in the pelvis with surgical plate and screw fixation of the pubic symphysis. Again noted is a surgical screw extending through bilateral SI joints and surgical screw in the left ilium. Markedly displaced fractures of the right fifth, sixth and seventh ribs. Also evidence for acute fractures involving the right eighth and ninth ribs. Patient has evidence of old right rib fractures. New left fractures including a  displaced lateral left fifth rib fracture. Old vertebral body compression fractures at L1, T11 and T6. IMPRESSION: 1. Moderate sized hiatal hernia with colon anterior to the distal stomach. 2. Bilateral pleural effusions, right side greater than left. Patchy densities at the right lung base. Findings could be associated with atelectasis or infection. 3. Interval enlargement of common bile duct measuring up to 1.1 cm. Recommend correlation with liver function tests. 4. Right renal mass. This has been present on previous imaging examinations. 5. Subcutaneous edema and presacral edema. Findings may be associated with anasarca. 6. New displaced bilateral rib fractures. Electronically Signed   By: Richarda Overlie M.D.   On: 11/30/2023 10:26      Family Communication:  Disposition: Status is: Inpatient Remains inpatient appropriate because: A-fib with RVR, IV antibiotics, IV heparin, cardiology follow-up  Planned Discharge Destination:  to be determined     MDM level 3   Author: Darlin Priestly, MD 11/30/2023 8:39 PM Secure chat 7am to 7pm For on call review [email or web address].

## 2023-11-30 NOTE — Progress Notes (Signed)
Received phone call from Maricar RN at [phone] (while this RN on break) stating patient was found dyspneic after pulling off nasal cannula. Stated patient was placed on non-rebreather. Informed Maricar that full report was given to Darien Ramus RN/Charge RN who was able to assume care as she was without a patient assignment this shift. Maricar stated that she was very concerned and worried and if  "patient had not called the desk, things could have been bad". Instructed Maricar RN to give prn dose of morphine and that I would return ASAP from break.  Upon this RN;s arrival to room @ [phone] patient was on baseline 2L [place] with continuous pulse ox saturations noted to be 99%. Vitals were done immediately and were baseline/stable. Patient continues with tachypnea/rapid respirations which have been unchanged from dayshift report. Maricar RN states patient should have a Soil scientist. Mitts reapplied and noted to be a successful detriment for impulsive behaviour. Informed Maricar that patient has not attempted to remove lines/equipment prior to this incident and Q1 hr bedside rounds have been done by this RN throughout shift. Instructed Maricar to chart vitals and implementations related to this incident as it would be helpful for care.

## 2023-11-30 NOTE — Progress Notes (Signed)
Rounding Note    Patient Name: Tonya Cummings Date of Encounter: 11/30/2023  [facility] Cardiologist: Julien Nordmann, MD   Subjective   Patient is somnolent on exam. She remains in atrial flutter with rate 80-100 bpm. NPO at this time, plan for PEG tube placement today.   Inpatient Medications    Scheduled Meds:  aspirin  325 mg Oral Daily   Chlorhexidine Gluconate Cloth  6 each Topical Daily   lidocaine  1 patch Transdermal Q24H   metoprolol tartrate  25 mg Oral BID   mupirocin ointment   Topical BID   pramipexole  0.125 mg Oral q morning   And   pramipexole  0.25 mg Oral QHS   sodium chloride flush  3 mL Intravenous Q12H   sodium chloride flush  3 mL Intravenous Q12H   Continuous Infusions:  amiodarone 30 mg/hr (11/30/23 0733)   cefTRIAXone (ROCEPHIN)  IV Stopped (11/29/23 1925)   DAPTOmycin Stopped (11/28/23 1428)   dextrose 50 mL/hr at 11/30/23 0829   heparin 1,850 Units/hr (11/30/23 0801)   micafungin (MYCAMINE) 100 mg in sodium chloride 0.9 % 100 mL IVPB     PRN Meds: bisacodyl, ipratropium-albuterol, metoprolol tartrate, morphine injection, mouth rinse, mouth rinse, polyethylene glycol, senna-docusate   Vital Signs    Vitals:   11/30/23 0630 11/30/23 0700 11/30/23 0730 11/30/23 0743  BP:    123/71  Pulse: 92 64 77 73  Resp: (!) 22 (!) 22 20 16   Temp:    (!) 97.5 F (36.4 C)  TempSrc:      SpO2: 100% 100% 98%   Weight:      Height:        Intake/Output Summary (Last 24 hours) at 11/30/2023 1111 Last data filed at 11/30/2023 0801 Gross per 24 hour  Intake 2026.48 ml  Output 900 ml  Net 1126.48 ml      11/30/2023    5:00 AM 11/29/2023    4:49 AM 11/28/2023    4:00 AM  Last 3 Weights  Weight (lbs) 167 lb 8.8 oz 167 lb 5.3 oz 161 lb 2.5 oz  Weight (kg) 76 kg 75.9 kg 73.1 kg      Telemetry    Atrial flutter with rate 80-100 bpm - Personally Reviewed  Physical Exam   GEN: No acute distress, somnolent on exam Neck: No  JVD Cardiac: RRR, no murmurs, rubs, or gallops.  Respiratory: Expiratory wheezing noted GI: Soft, nontender, non-distended  MS: No edema; No deformity. Neuro:  Nonfocal  Psych: Normal affect   Labs    High Sensitivity Troponin:   Recent Labs  Lab 11/19/23 1310 11/19/23 1455 11/19/23 2039 11/20/23 0252 11/20/23 2207  TROPONINIHS 208* 392* 749* 804* 293*     Chemistry Recent Labs  Lab 11/26/23 0434 11/27/23 0322 11/28/23 0430 11/29/23 0429 11/30/23 0727  NA 143   < > 145 149* 148*  K 4.6   < > 3.9 4.3 4.6  CL 107   < > 107 111 112*  CO2 22   < > 25 23 21*  GLUCOSE 92   < > 111* 110* 102*  BUN 83*   < > 78* 81* 85*  CREATININE 2.58*   < > 2.52* 2.80* 3.03*  CALCIUM 9.7   < > 9.7 9.6 9.2  MG 2.4  --  2.5*  --  2.5*  ALBUMIN 2.4*   < > 2.7* 2.6* 2.5*  GFRNONAA 19*   < > 20* 18* 16*  ANIONGAP 14   < >  13 15 15    < > = values in this interval not displayed.    Lipids No results for input(s): "CHOL", "TRIG", "HDL", "LABVLDL", "LDLCALC", "CHOLHDL" in the last 168 hours.  Hematology Recent Labs  Lab 11/28/23 0430 11/29/23 0429 11/30/23 0727  WBC 14.1* 15.5* 17.6*  RBC 2.98* 2.74* 2.86*  HGB 8.3* 7.8* 7.9*  HCT 26.8* 25.1* 26.1*  MCV 89.9 91.6 91.3  MCH 27.9 28.5 27.6  MCHC 31.0 31.1 30.3  RDW 17.2* 17.4* 17.9*  PLT 413* 419* 418*   Thyroid No results for input(s): "TSH", "FREET4" in the last 168 hours.  BNPNo results for input(s): "BNP", "PROBNP" in the last 168 hours.  DDimer No results for input(s): "DDIMER" in the last 168 hours.   Radiology    CT ABDOMEN PELVIS WO CONTRAST Result Date: 11/30/2023 CLINICAL DATA:  Dysphagia. Evaluate anatomy for gastrostomy tube placement. EXAM: CT ABDOMEN AND PELVIS WITHOUT CONTRAST TECHNIQUE: Multidetector CT imaging of the abdomen and pelvis was performed following the standard protocol without IV contrast. RADIATION DOSE REDUCTION: This exam was performed according to the departmental dose-optimization program which  includes automated exposure control, adjustment of the mA and/or kV according to patient size and/or use of iterative reconstruction technique. COMPARISON:  05/28/2023 and chest CT 07/15/2023 FINDINGS: Lower chest: Bilateral pleural effusions, right side larger than left. Patchy densities in the right lower lung. Infection cannot be excluded. Hepatobiliary: Gallbladder appears to be surgically absent. No focal liver abnormality. Common bile duct is prominent measuring 1.1 cm and measured approximately 0.7 cm on 05/28/2023. Pancreas: Unremarkable. No pancreatic ductal dilatation or surrounding inflammatory changes. Spleen: Normal in size without focal abnormality. Adrenals/Urinary Tract: Normal adrenal glands. Normal appearance of the left kidney without stones or hydronephrosis. Again noted is a central right renal mass there is poorly defined on this study without intravascular contrast. Mass roughly measures 5.5 x 4.4 cm. Again noted is a slightly hyperdense exophytic structure in the right kidney lower pole. Foley catheter in the urinary bladder. Stomach/Bowel: Sigmoid colon extends into the upper abdomen and anterior to the distal stomach. Oral contrast within the colon. Moderate sized hiatal hernia with the stomach body posterior to the sigmoid colon and there are small bowel loops along the left side of the distal gastric body. No evidence for bowel dilatation or obstruction. No focal bowel inflammation. Vascular/Lymphatic: Aortic atherosclerosis. No enlarged abdominal or pelvic lymph nodes. Reproductive: Uterus and bilateral adnexa are unremarkable. Other: Moderate sized hiatal hernia. Spleen may be slightly pulled into the hiatal hernia as well and this is similar to the previous CT examination. No evidence for ascites. Presacral edema. Diffuse subcutaneous edema. Musculoskeletal: Postsurgical changes in the pelvis with surgical plate and screw fixation of the pubic symphysis. Again noted is a surgical screw  extending through bilateral SI joints and surgical screw in the left ilium. Markedly displaced fractures of the right fifth, sixth and seventh ribs. Also evidence for acute fractures involving the right eighth and ninth ribs. Patient has evidence of old right rib fractures. New left fractures including a displaced lateral left fifth rib fracture. Old vertebral body compression fractures at L1, T11 and T6. IMPRESSION: 1. Moderate sized hiatal hernia with colon anterior to the distal stomach. 2. Bilateral pleural effusions, right side greater than left. Patchy densities at the right lung base. Findings could be associated with atelectasis or infection. 3. Interval enlargement of common bile duct measuring up to 1.1 cm. Recommend correlation with liver function tests. 4. Right renal mass. This  has been present on previous imaging examinations. 5. Subcutaneous edema and presacral edema. Findings may be associated with anasarca. 6. New displaced bilateral rib fractures. Electronically Signed   By: Richarda Overlie M.D.   On: 11/30/2023 10:26    Cardiac Studies   11/20/2023 Echocardiogram 1. Left ventricular ejection fraction, by estimation, is 55 to 60%. The  left ventricle has normal function. The left ventricle has no regional  wall motion abnormalities. There is mild concentric left ventricular  hypertrophy. Left ventricular diastolic  parameters are indeterminate.   2. Right ventricular systolic function is normal. The right ventricular  size is normal.   3. Left atrial size was moderately dilated.   4. The mitral valve is grossly normal. Trivial mitral valve  regurgitation. No evidence of mitral stenosis.   5. The aortic valve is grossly normal. There is mild calcification of the  aortic valve. There is mild thickening of the aortic valve. Aortic valve  regurgitation is not visualized. Aortic valve sclerosis/calcification is  present, without any evidence of  aortic stenosis.  Patient Profile     72  y.o. female with a h/o HFpEF, CKD stage 4, HTN who presented for shoulder surgery, post-op course complicated by respiratory failure and PEA arrest requiring CPR with ROSC, is being seen for continued evaluation of new onset Afib/flutter with RVR.   Assessment & Plan    Afib/flutter with RVR - New s/p post-op PEA arrest requiring CPR - Remains in atrial fibrillation with rates average 80-100 - Continue IV amiodarone  - Continue IV heparin - Continue metoprolol 25 mg BID when patient able  - Currently NPO although plan for PEG placement today - Plan for DOAC on d/c - Consider TEE/DCCV if patient does not convert to sinus medically   PEA arrest NSTEMI - Post-op PEA arrest requiring CPR for 5-6 minutes with achievement of ROSC - Patient was intubated and transferred to the ICU requiring pressors. She was extubated 12/13. - off pressors - HS trop 749>804 - Echo showed LVEF 55-60% - No plan for cath this admission due to mental status and CKD, consider outpatient ischemic evaluation   HTN - BP stable 120/60s on average, seems to become elevated with agitation - Not on antihypertensives now - Pressures should improve when able to continue metoprolol   AI on CKD IV - Cr 2.8>>3.03, baseline ~2  Altered mental status - Patient has declined significantly from baseline - Consider repeat head CT  Orion Crook, PA-C  11/30/2023, 11:11 AM

## 2023-11-30 NOTE — Progress Notes (Signed)
Patient clinically stable post IR Gastrostomy tube placement per Dr. Wyn Quaker, tolerated well. Vitals stable immediately post procedure . Received Versed 1 mg along with Fentanyl 50 mcg IV for procedure. Report given to [facility] RN post procedure/specials/24.

## 2023-11-30 NOTE — Progress Notes (Signed)
Entered room to find both heparin and amio gtt paused. Restarted both gtt and hung new bag to find amio unhooked from internal jugular trialysis line and draining onto the floor. Noted BP cuff also unhooked. I was personally in the room for CHG bath and peri+foley care at 11am. No family noted to be in room during or after that time. Patient has remained in soft mitts throughout shift. Amio hooked back up and all ports cleaned. Tereasa Coop and charge RN notified.

## 2023-11-30 NOTE — H&P (Signed)
Chief Complaint: Patient was seen in consultation today for percutaneous gastrostomy placement, at the request of Dr. Darlin Priestly, MD  Supervising Physician: Malachy Moan  Patient Status: [facility] - [facility]-pt  Full Code status per patient, patient's husband, and chart review.  History of Present Illness: Per Dr. Tama Gander progress note on 12/17: Tonya Cummings is a 72 y.o. female with a past medical history of CKD stage IV, heart failure with preserved EF, hypertension, rheumatoid arthritis, OSA not on CPAP presents to [facility] on 12/06 for right reverse shoulder arthroplasty after for suspicion of septic joint. She initially underwent right shoulder arthroplasty on 08/30/2023 after which she developed a.  Periprosthetic fracture status post conversion to a longstem humeral component with ORIF on 10/03/2023.  No evidence of infection at the time. Follow-up lab work showed elevated inflammatory markers including ESR suspicious for an infectious process.  Therefore she presents back on 12/06 for shoulder arthroplasty explant of glenoid and humeral component open treatment of right humerus fracture with intramedullary device and insertion of antibiotic delivery system.  Small area of purulence was noted.  Cultures were sent.  Started on daptomycin and ceftriaxone.   She did well immediately postop however on 12/07 she was getting up to the bedside commode she went into respiratory distress with O2 sat dropping becoming apneic and went into PEA arrest.  CPR for roughly 5 to 6-minute prior to ROSC epi x 2.  Not shockable rhythm.  Patient intubated and transferred to the ICU for further care.  Postintubation chest x-ray with right pneumothorax status post chest tube placement 12/07.  HD catheter placed 12/07 anticipating need for dialysis given worsening kidney function.    During ICU stay patient had circulatory shock requiring pressors.  She has elevated troponin in the setting of  cardiac arrest, new onset A-fib.  Echocardiogram 12/08 with normal LVEF 55 to 60%.  RV systolic function is normal and size is normal. Ultrasound venous lower extremity 12/08 negative for DVT. Patient had pneumothorax 12/7 for which a right chest tube was placed which is removed 12/11.  Her kidney function remained stable and nephrologist followed advised no acute indication for dialysis. She is successfully extubated 12/13 and transferred to [facility] service.  Interventional Radiology was requested for percutaneous gastrostomy placement. Request was reviewed and approved by Dr. Archer Asa. Patient is scheduled for same in IR today.  Per Dr. Archer Asa, all labs and medications are within acceptable parameters. No pertinent allergies. Patient has been NPO since 12/16. Currently on Asprin and Heparin.   Patient intermittently alert. She is in soft mitt restraints, as she is agitated in her sleep, and has been unknowingly pulling on the lines. She does attempt to remove her mitts while alert. Her husband denies any fevers, vomiting or bleeding. Patient can answer some questions when alert. She can only whisper.    Return precautions and treatment recommendations and follow-up discussed with the patient and her husband, who are agreeable with the plan.   Past Medical History:  Diagnosis Date   Acute hypoxemic respiratory failure (HCC)    Anemia in stage 4 chronic kidney disease (HCC)    Anginal pain (HCC)    Anxiety    Aortic atherosclerosis (HCC)    CHF (congestive heart failure) (HCC)    a.) TTE 09/30/2021: EF 55-60%, no RWMAs, mild MR, G1DD   Chronic pain    Chronic radicular pain of lower back    CKD (chronic kidney disease), stage IV (HCC)  Complication of anesthesia    a.) delayed emergence following ureteroscopy 07/2023   Coronary artery calcification seen on CT scan    Costochondritis    DDD (degenerative disc disease), lumbar    Depression    Dyspnea    GERD (gastroesophageal reflux  disease)    Hiatal hernia    Hip dysplasia    Hyperlipidemia    Hypertension    Insomnia    Iron deficiency    Iron deficiency anemia    Low back pain    Low vitamin B12 level    Lumbar facet joint pain    Migraines    Nose colonized with MRSA 10/03/2020   a.) preop PCR (+) 10/03/2020 prior to RIGHT TKA; b.) preop PCR (+) 08/25/2023 prior to RIGHT REVERSE SHOULDER ARTHROPLASTY; BICEPS TENODESIS   Numbness and tingling of right leg    OA (osteoarthritis)    Obesity    OSA (obstructive sleep apnea)    a.) not currently utilizing nocturnal PAP therapy; positional. PCCM feels as if symptom can be mitigated with diet/exercise/weight loss unless develops significant symptoms.   Osteoporosis    a.) recieves denosumab injections   Pelvic fracture (HCC)    Pneumonia    Pulmonary fibrosis (HCC)    Raynaud's disease without gangrene    Restless leg syndrome    a.) on pramipexole + oral Fe supplementation   Rheumatoid arthritis (HCC)    a.) Tx'd with hydroxychloroquine   Right renal mass 05/28/2023   a.) CT renal 05/28/2023:  5.7 cm mass in the medial aspect of right kidney   Seasonal allergies    Thoracic compression fracture [facility])     Past Surgical History:  Procedure Laterality Date   ABDOMINAL SURGERY     pt denies   APPENDECTOMY     CARPAL TUNNEL RELEASE Bilateral    CHOLECYSTECTOMY     COLONOSCOPY WITH PROPOFOL N/A 08/15/2020   Procedure: COLONOSCOPY WITH PROPOFOL;  Surgeon: Earline Mayotte, MD;  Location: [facility] ENDOSCOPY;  Service: Endoscopy;  Laterality: N/A;   CYSTOSCOPY W/ RETROGRADES Right 07/18/2023   Procedure: CYSTOSCOPY WITH RETROGRADE PYELOGRAM;  Surgeon: [name], MD;  Location: [facility] ORS;  Service: Urology;  Laterality: Right;   DILATION AND CURETTAGE OF UTERUS     ESOPHAGOGASTRODUODENOSCOPY (EGD) WITH PROPOFOL N/A 08/15/2020   Procedure: ESOPHAGOGASTRODUODENOSCOPY (EGD) WITH PROPOFOL;  Surgeon: Earline Mayotte, MD;  Location: [facility] ENDOSCOPY;  Service:  Endoscopy;  Laterality: N/A;   FRACTURE SURGERY     hip fracture    FRACTURE SURGERY     pelvic fracture plate    HIP SURGERY Left    KNEE ARTHROPLASTY Right 10/13/2020   Procedure: COMPUTER ASSISTED TOTAL KNEE ARTHROPLASTY - RNFA;  Surgeon: Donato Heinz, MD;  Location: [facility] ORS;  Service: Orthopedics;  Laterality: Right;   REVERSE SHOULDER ARTHROPLASTY Right 08/30/2023   Procedure: Right reverse shoulder arthroplasty, biceps tenodesis;  Surgeon: Signa Kell, MD;  Location: [facility] ORS;  Service: Orthopedics;  Laterality: Right;   TOTAL SHOULDER REVISION Right 10/03/2023   Procedure: Revision right reverse shoulder arthroplasty with conversion to long humeral stem and open reduction internal fixation of the humerus;  Surgeon: Signa Kell, MD;  Location: [facility] ORS;  Service: Orthopedics;  Laterality: Right;   TOTAL SHOULDER REVISION Right 11/18/2023   Procedure: Right revision reverse shoulder arthroplasty (conversion to longstem and glenosphere exchange);  Surgeon: Signa Kell, MD;  Location: [facility] ORS;  Service: Orthopedics;  Laterality: Right;   TUBAL LIGATION     URETEROSCOPY  07/18/2023   Procedure: DIAGNOSTIC URETEROSCOPY;  Surgeon: [name], MD;  Location: [facility] ORS;  Service: Urology;;    Allergies: Gabapentin and Ibuprofen  Medications: Prior to Admission medications   Medication Sig Start Date End Date Taking? Authorizing Provider  acetaminophen (TYLENOL) 500 MG tablet Take 2 tablets (1,000 mg total) by mouth every 6 (six) hours as needed. 08/31/23  Yes Anson Oregon, PA-C  aspirin EC 325 MG tablet Take 1 tablet (325 mg total) by mouth daily. 08/31/23  Yes Anson Oregon, PA-C  calcitRIOL (ROCALTROL) 0.25 MCG capsule Take 0.25 mcg by mouth every morning. 05/22/21  Yes [provider]  Calcium Carb-Cholecalciferol 600-400 MG-UNIT CAPS Take 1 capsule by mouth 3 (three) times daily. 01/13/10  Yes [provider]  chlorhexidine (HIBICLENS) 4 % external  liquid Apply 15 mLs (1 Application total) topically as directed for 30 doses. Use as directed daily for 5 days every other week for 6 weeks. 11/18/23  Yes Signa Kell, MD  Cholecalciferol 25 MCG (1000 UT) tablet Take 1,000 Units by mouth every morning. 09/23/09  Yes [provider]  cyanocobalamin 1000 MCG tablet Take 1,000 mcg by mouth every morning.   Yes [provider]  cyclobenzaprine (FLEXERIL) 10 MG tablet Take 10 mg by mouth at bedtime as needed for muscle spasms (Leg cramps).   Yes [provider]  enalapril-hydrochlorothiazide (VASERETIC) 10-25 MG tablet Take 1 tablet by mouth at bedtime. 09/12/23  Yes Hammock, Lavonna Rua, NP  ferrous sulfate 325 (65 FE) MG tablet Take 325 mg by mouth daily with breakfast.   Yes [provider]  furosemide (LASIX) 40 MG tablet Take 1.5 tablets (60 mg total) by mouth every morning. Increase to 1.5 tablet (60 mg total) by mouth in morning and extra 1 tablet (40 mg total for maximum daily dose 100 mg) at lunch time as needed for up to 3 days for increased leg swelling, shortness of breath, weight gain 5+ lbs over 1-2 days. Seek medical care if these symptoms are not improving with increased dose. 09/02/23  Yes Sunnie Nielsen, DO  hydroxychloroquine (PLAQUENIL) 200 MG tablet Take 200 mg by mouth 2 (two) times daily. 06/22/21  Yes [provider]  mirtazapine (REMERON) 30 MG tablet Take 30 mg by mouth at bedtime. 06/29/22  Yes [provider]  Multiple Vitamins-Minerals (MULTIVITAMIN ADULT PO) Take 1 tablet by mouth every morning. 09/10/08  Yes [provider]  mupirocin ointment (BACTROBAN) 2 % Apply small about inside of both nostrils TWICE a day for the next 5 days. 08/25/23  Yes Verlee Monte, NP  mupirocin ointment (BACTROBAN) 2 % Place 1 Application into the nose 2 (two) times daily for 60 doses. Use as directed 2 times daily for 5 days every other week for 6 weeks. 11/18/23 12/18/23 Yes Signa Kell, MD   omeprazole (PRILOSEC) 40 MG capsule Take 40 mg by mouth every morning.   Yes [provider]  ondansetron (ZOFRAN) 4 MG tablet Take 1 tablet (4 mg total) by mouth every 6 (six) hours as needed for nausea. 08/31/23  Yes Anson Oregon, PA-C  oxyCODONE (OXY IR/ROXICODONE) 5 MG immediate release tablet Take 1 tablet (5 mg total) by mouth every 4 (four) hours as needed for moderate pain (pain score 4-6) (pain score 4-6). 10/04/23  Yes Dedra Skeens, PA-C  Pirfenidone 267 MG TABS Take 801 tablets by mouth 3 (three) times daily. 12/24/22  Yes [provider]  pramipexole (MIRAPEX) 0.125 MG tablet Take 0.125-0.25 mg  by mouth See admin instructions. Take 0.125 mg in the morning and 0.25 mg  at bedtime 02/15/18  Yes [provider]  PROLIA 60 MG/ML SOSY injection Inject 60 mg into the skin every 6 (six) months. 04/06/23  Yes [provider]  rosuvastatin (CRESTOR) 5 MG tablet Take 5 mg by mouth every other day. 07/18/19  Yes [provider]  spironolactone (ALDACTONE) 25 MG tablet Take 25 mg by mouth at bedtime. 04/09/21  Yes [provider]  tetrahydrozoline 0.05 % ophthalmic solution Place 1 drop into both eyes daily as needed (allergies).   Yes [provider]  venlafaxine XR (EFFEXOR-XR) 150 MG 24 hr capsule Take 150 mg by mouth See admin instructions. Take with 75 mg for total 225 mg in the morning 12/28/22 12/28/23 Yes [provider]  venlafaxine XR (EFFEXOR-XR) 75 MG 24 hr capsule Take 75 mg by mouth See admin instructions. Take with 150 mg for a total of 225 mg in the morning 08/24/23  Yes [provider]     Family History  Problem Relation Age of Onset   Diabetes Mother    Hypertension Mother    Aneurysm Father    Diabetes Son    Seizures Son    Osteosarcoma Brother    Cancer Brother    Diabetes Brother    Diabetes Maternal Grandfather    Heart disease Maternal Grandfather     Social History   Socioeconomic  History   Marital status: Married    Spouse name: Molly Maduro  [phone]   Number of children: Not on file   Years of education: Not on file   Highest education level: Not on file  Occupational History   Not on file  Tobacco Use   Smoking status: Never   Smokeless tobacco: Never  Vaping Use   Vaping status: Never Used  Substance and Sexual Activity   Alcohol use: No   Drug use: No   Sexual activity: Yes  Other Topics Concern   Not on file  Social History Narrative   Not on file   Social Drivers of Health   Financial Resource Strain: Not on file  Food Insecurity: Patient Unable To Answer (11/20/2023)   Hunger Vital Sign    Worried About Running Out of Food in the Last Year: Patient unable to answer    Ran Out of Food in the Last Year: Patient unable to answer  Transportation Needs: Patient Unable To Answer (11/20/2023)   PRAPARE - Administrator, Civil Service (Medical): Patient unable to answer    Lack of Transportation (Non-Medical): Patient unable to answer  Physical Activity: Not on file  Stress: Not on file  Social Connections: Not on file    Review of Systems: A 12 point ROS discussed and pertinent positives are indicated in the HPI above.  All other systems are negative.  Review of Systems  Constitutional:  Positive for activity change, appetite change, chills and fever.  HENT:  Positive for trouble swallowing.   Respiratory:  Positive for chest tightness and shortness of breath.   Cardiovascular:  Positive for chest pain.  Gastrointestinal:  Positive for abdominal pain.  Musculoskeletal:  Positive for arthralgias.  Skin:  Negative for color change.  Neurological:  Positive for dizziness and weakness.  Psychiatric/Behavioral:  Positive for agitation and confusion.     Vital Signs: BP 123/71 (BP Location: Left Arm)   Pulse 73   Temp (!) 97.5 F (36.4 C)  Resp 16   Ht 5\' 3"  (1.6 m)   Wt 167 lb 8.8 oz (76 kg)   SpO2 98%   BMI 29.68 kg/m    Advance Care Plan: The advanced care plan/surrogate decision maker was discussed at the time of visit and documented in the medical record.    Physical Exam Constitutional:      Appearance: She is ill-appearing.     Comments: Intermittently alert. Somnolent.  HENT:     Mouth/Throat:     Mouth: Mucous membranes are dry.  Cardiovascular:     Rate and Rhythm: Normal rate and regular rhythm.     Pulses: Normal pulses.     Heart sounds: No murmur heard. Pulmonary:     Effort: Pulmonary effort is normal. No respiratory distress.     Breath sounds: Normal breath sounds.  Abdominal:     General: Abdomen is flat.     Palpations: Abdomen is soft.     Tenderness: There is abdominal tenderness.  Musculoskeletal:     Comments: Confined to bed  Skin:    General: Skin is warm and dry.  Neurological:     Comments: Patient is intermittently alert to self, place, condition.  Psychiatric:     Comments: Patient cannot consent for herself.     Imaging: DG Chest Port 1 View Result Date: 11/26/2023 CLINICAL DATA:  10026 with shortness of breath. EXAM: PORTABLE CHEST 1 VIEW COMPARISON:  Portable chest 11/24/2023 at 4:30 a.m. FINDINGS: 6:07 a.m. ETT/NGT interval removal. Right IJ dialysis catheter tip remains in the upper right atrium. Stable enlargement of the cardiac silhouette. Unchanged mild central vascular prominence. There is increased streaky atelectasis or infiltrate in the retrocardiac left lower lobe. There is increased patchy airspace disease in the right upper lobe with unchanged denser consolidation right lower lung field. The left upper lung field remains clear. There are small pleural effusions. Osteopenia. No new osseous findings with right shoulder replacement and overlying skin staples again shown. In all other respects no other changes are seen. IMPRESSION: 1. Increased patchy airspace disease in the right upper lobe with unchanged denser consolidation right lower lung field. 2.  Increased streaky atelectasis or infiltrate in the retrocardiac left lower lobe. 3. Small pleural effusions. 4. Stable cardiomegaly and mild central vascular prominence. 5. Interval removal of ETT/NGT. Electronically Signed   By: Almira Bar M.D.   On: 11/26/2023 06:29   DG Chest Port 1 View Result Date: 11/24/2023 CLINICAL DATA:  [id number] with acute hypoxic respiratory failure. EXAM: PORTABLE CHEST 1 VIEW COMPARISON:  Portable chest yesterday at 3:16 p.m. FINDINGS: 4:30 a.m. Left IJ dialysis catheter terminates in the right atrium slightly above the inferior cavoatrial junction. Interval removal pigtail right chest tube. No measurable pneumothorax. ETT tip is 3.3 cm from the carina, NGT tip is in the gastric antrum. Multiple overlying monitor wires. Stable cardiomegaly. There is mild central vascular congestion but with improvement. Mediastinal configuration is stable. There is patchy airspace disease in the right lower lung field with no interval improvement or worsening. Left lung is clear. There is no substantial pleural effusion. No new osseous findings. IMPRESSION: 1. Interval removal of right chest tube. No measurable pneumothorax. 2. Patchy airspace disease in the right lower lung field with no interval improvement or worsening. 3. Cardiomegaly with mild central vascular congestion but with improvement. Electronically Signed   By: Almira Bar M.D.   On: 11/24/2023 06:38   DG Chest Port 1 View Result Date: 11/23/2023 CLINICAL DATA:  Pneumothorax.  EXAM: PORTABLE CHEST 1 VIEW COMPARISON:  Earlier radiograph dated 11/23/2023. FINDINGS: Support lines and tubes in similar position. Interval progression bilateral pulmonary opacities. No large pleural effusion. No pneumothorax. Stable cardiac silhouette. No acute osseous pathology. IMPRESSION: 1. Interval progression of bilateral pulmonary opacities. 2. No pneumothorax. Electronically Signed   By: Elgie Collard M.D.   On: 11/23/2023 16:24   DG  Chest Port 1 View Result Date: 11/23/2023 CLINICAL DATA:  72 year old female with history of pneumothorax. EXAM: PORTABLE CHEST 1 VIEW COMPARISON:  Multiple priors, most recently 11/22/2023. FINDINGS: An endotracheal tube is in place with tip 3.6 cm above the carina. Left internal jugular Vas-Cath with tip projecting over the right atrium. Nasogastric tube extends into the antral pre-pyloric region of the stomach. Small bore right-sided chest tube with pigtail reformed over the medial aspect of the right hemithorax. Lung volumes are low. Widespread interstitial prominence and peribronchial cuffing throughout both lungs with some patchy ill-defined opacities throughout the right lung, concerning for bronchitis with right-sided bronchopneumonia. No definite pleural effusions. No appreciable pneumothorax. No evidence of pulmonary edema. Heart size is mildly enlarged. Upper mediastinal contours are within normal limits allowing for patient positioning. Status post right shoulder arthroplasty. IMPRESSION: 1. Support apparatus and postoperative changes, as above. 2. The appearance of the lungs is again most compatible with a background of bronchitis and right-sided multilobar bronchopneumonia. Overall, aeration appears similar to the recent prior study. Electronically Signed   By: Trudie Reed M.D.   On: 11/23/2023 05:48   DG Chest Port 1 View Result Date: 11/22/2023 CLINICAL DATA:  Pneumothorax EXAM: PORTABLE CHEST 1 VIEW COMPARISON:  11/22/2023 FINDINGS: Single frontal view of the chest demonstrates stable endotracheal tube, enteric catheter, left internal jugular dialysis catheter, and right-sided pigtail drainage catheter. Cardiac silhouette is unremarkable. Patchy bilateral airspace disease is again noted, without significant change since prior exam. Decreased lung volumes, with developing consolidation at the right lung base favoring atelectasis. No evidence of effusion or pneumothorax. Right shoulder  arthroplasty. IMPRESSION: 1. Stable support devices. 2. Continued bilateral multifocal bronchopneumonia, with likely developing atelectasis at the right base. 3. No evidence of pneumothorax. Electronically Signed   By: Sharlet Salina M.D.   On: 11/22/2023 17:28   DG Chest Port 1 View Result Date: 11/22/2023 CLINICAL DATA:  72 year old female with history of chest tube. Follow-up study. EXAM: PORTABLE CHEST 1 VIEW COMPARISON:  Chest x-ray 11/21/2023. FINDINGS: An endotracheal tube is in place with tip 2.7 cm above the carina. Left internal jugular Vas-Cath with tip terminating in the right atrium. Small bore right-sided chest tube with pigtail reformed over the medial right hemithorax. A nasogastric tube is seen extending into the stomach, however, the tip of the nasogastric tube extends below the lower margin of the image. Lung volumes are slightly low. Diffuse interstitial prominence, peribronchial cuffing and patchy ill-defined opacities are again noted throughout the lungs bilaterally (right greater than left), overall with slightly improved aeration compared to the prior examination. No pleural effusions. No definite pneumothorax. Heart size appears borderline enlarged. Upper mediastinal contours are within normal limits. Status post right shoulder arthroplasty. IMPRESSION: 1. Support apparatus, as above. 2. Slight improved aeration in the lungs which may reflect resolving multilobar bilateral bronchopneumonia. Electronically Signed   By: Trudie Reed M.D.   On: 11/22/2023 06:49   DG Chest Port 1 View Result Date: 11/21/2023 CLINICAL DATA:  Pneumothorax EXAM: PORTABLE CHEST 1 VIEW COMPARISON:  11/20/2023 FINDINGS: Indwelling right pigtail chest tube. Suspected tiny right apical pneumothorax. Multifocal  patchy opacities in the lungs bilaterally, right lower lobe predominant. No pleural effusion. The heart is top-normal in size. Endotracheal tube terminates 3.1 cm above the carina. Enteric tube courses  into the diaphragm. Left IJ dual lumen dialysis catheter terminates in the right atrium. Right shoulder arthroplasty with overlying skin staples. IMPRESSION: Indwelling right pigtail chest tube. Suspected tiny right apical pneumothorax. Multifocal patchy opacities in the lungs bilaterally, right lower lobe predominant. Support apparatus as above. Electronically Signed   By: Charline Bills M.D.   On: 11/21/2023 17:28   DG Abd 1 View Result Date: 11/21/2023 CLINICAL DATA:  Orogastric tube placement. EXAM: ABDOMEN - 1 VIEW COMPARISON:  None Available. FINDINGS: Distal tip of nasogastric tube is seen in expected position of the stomach. IMPRESSION: Distal tip of nasogastric tube is seen in expected position of the stomach. Electronically Signed   By: Lupita Raider M.D.   On: 11/21/2023 10:25   ECHOCARDIOGRAM COMPLETE Result Date: 11/20/2023    ECHOCARDIOGRAM REPORT   Patient Name:   Tonya Cummings Date of Exam: 11/20/2023 Medical Rec #:  [id number]         Height:       63.0 in Accession #:    [id number]        Weight:       186.7 lb Date of Birth:  1951/09/10        BSA:          1.878 m Patient Age:    71 years          BP:           115/65 mmHg Patient Gender: F                 HR:           92 bpm. Exam Location:  [facility] Procedure: 2D Echo, Cardiac Doppler and Color Doppler Indications:     Cardiac arrest I46.9  History:         Patient has prior history of Echocardiogram examinations. CHF.  Sonographer:     Neysa Bonito Roar Referring Phys:  [id number] Erin Fulling Diagnosing Phys: Jodelle Red MD IMPRESSIONS  1. Left ventricular ejection fraction, by estimation, is 55 to 60%. The left ventricle has normal function. The left ventricle has no regional wall motion abnormalities. There is mild concentric left ventricular hypertrophy. Left ventricular diastolic parameters are indeterminate.  2. Right ventricular systolic function is normal. The right ventricular size is normal.  3. Left atrial size was  moderately dilated.  4. The mitral valve is grossly normal. Trivial mitral valve regurgitation. No evidence of mitral stenosis.  5. The aortic valve is grossly normal. There is mild calcification of the aortic valve. There is mild thickening of the aortic valve. Aortic valve regurgitation is not visualized. Aortic valve sclerosis/calcification is present, without any evidence of aortic stenosis. Comparison(s): No significant change from prior study. Prior images reviewed side by side. FINDINGS  Left Ventricle: Left ventricular ejection fraction, by estimation, is 55 to 60%. The left ventricle has normal function. The left ventricle has no regional wall motion abnormalities. The left ventricular internal cavity size was normal in size. There is  mild concentric left ventricular hypertrophy. Left ventricular diastolic parameters are indeterminate. Right Ventricle: The right ventricular size is normal. No increase in right ventricular wall thickness. Right ventricular systolic function is normal. Left Atrium: Left atrial size was moderately dilated. Right Atrium: Right atrial size was normal in size. Pericardium: There is no  evidence of pericardial effusion. Mitral Valve: The mitral valve is grossly normal. Trivial mitral valve regurgitation. No evidence of mitral valve stenosis. MV peak gradient, 12.2 mmHg. The mean mitral valve gradient is 4.0 mmHg. Tricuspid Valve: The tricuspid valve is grossly normal. Tricuspid valve regurgitation is trivial. No evidence of tricuspid stenosis. Aortic Valve: The aortic valve is grossly normal. There is mild calcification of the aortic valve. There is mild thickening of the aortic valve. Aortic valve regurgitation is not visualized. Aortic valve sclerosis/calcification is present, without any evidence of aortic stenosis. Aortic valve mean gradient measures 5.0 mmHg. Aortic valve peak gradient measures 11.0 mmHg. Aortic valve area, by VTI measures 1.88 cm. Pulmonic Valve: The  pulmonic valve was grossly normal. Pulmonic valve regurgitation is trivial. No evidence of pulmonic stenosis. Aorta: The aortic root and ascending aorta are structurally normal, with no evidence of dilitation. Venous: The inferior vena cava was not well visualized. IAS/Shunts: The atrial septum is grossly normal.  LEFT VENTRICLE PLAX 2D LVIDd:         4.20 cm   Diastology LVIDs:         3.10 cm   LV e' medial:    8.81 cm/s LV PW:         1.20 cm   LV E/e' medial:  17.6 LV IVS:        1.20 cm   LV e' lateral:   17.10 cm/s LVOT diam:     1.80 cm   LV E/e' lateral: 9.1 LV SV:         33 LV SV Index:   17 LVOT Area:     2.54 cm  RIGHT VENTRICLE RV Basal diam:  2.90 cm RV Mid diam:    2.80 cm RV S prime:     12.60 cm/s TAPSE (M-mode): 1.6 cm LEFT ATRIUM             Index        RIGHT ATRIUM           Index LA diam:        4.00 cm 2.13 cm/m   RA Area:     10.50 cm LA Vol (A2C):   72.4 ml 38.55 ml/m  RA Volume:   21.20 ml  11.29 ml/m LA Vol (A4C):   65.5 ml 34.88 ml/m LA Biplane Vol: 70.3 ml 37.43 ml/m  AORTIC VALVE                    PULMONIC VALVE AV Area (Vmax):    1.58 cm     PV Vmax:          1.22 m/s AV Area (Vmean):   1.66 cm     PV Peak grad:     6.0 mmHg AV Area (VTI):     1.88 cm     PR End Diast Vel: 9.73 msec AV Vmax:           166.00 cm/s  RVOT Peak grad:   3 mmHg AV Vmean:          95.500 cm/s AV VTI:            0.173 m AV Peak Grad:      11.0 mmHg AV Mean Grad:      5.0 mmHg LVOT Vmax:         103.00 cm/s LVOT Vmean:        62.200 cm/s LVOT VTI:          0.128  m LVOT/AV VTI ratio: 0.74  AORTA Ao Root diam: 2.50 cm Ao Asc diam:  3.10 cm MITRAL VALVE MV Area (PHT): 5.34 cm     SHUNTS MV Area VTI:   0.83 cm     Systemic VTI:  0.13 m MV Peak grad:  12.2 mmHg    Systemic Diam: 1.80 cm MV Mean grad:  4.0 mmHg MV Vmax:       1.75 m/s MV Vmean:      88.3 cm/s MV Decel Time: 142 msec MV E velocity: 155.00 cm/s MV A velocity: 108.00 cm/s MV E/A ratio:  1.44 MV A Prime:    10.4 cm/s Jodelle Red MD  Electronically signed by Jodelle Red MD Signature Date/Time: 11/20/2023/5:41:09 PM    Final    DG Chest Port 1 View Result Date: 11/20/2023 CLINICAL DATA:  Pneumothorax.  Intubated.  Chest tube placement. EXAM: PORTABLE CHEST 1 VIEW COMPARISON:  11/19/2023; 08/30/2013 FINDINGS: Unchanged cardiac silhouette and mediastinal contours given patient rotation. Stable positioning of support apparatus. Particularly, stable positioning of right-sided chest tube. No pneumothorax. Redemonstrated heterogeneous airspace opacities within the right mid and upper lung. Left lung remains comparatively well aerated. Pulmonary vasculature remains indistinct. No definite pleural effusion. Postoperative change of the right shoulder with overlying skin staples, incompletely evaluated. IMPRESSION: 1. Stable positioning of support apparatus. Specifically, no pneumothorax. 2. Similar findings of right-sided airspace disease and pulmonary edema. Electronically Signed   By: Simonne Come M.D.   On: 11/20/2023 08:51   US Venous Img Lower Bilateral (DVT) Result Date: 11/20/2023 CLINICAL DATA:  Bilateral lower extremity pain and edema. History of kidney cancer. Evaluate for DVT. EXAM: BILATERAL LOWER EXTREMITY VENOUS DOPPLER ULTRASOUND TECHNIQUE: Gray-scale sonography with graded compression, as well as color Doppler and duplex ultrasound were performed to evaluate the lower extremity deep venous systems from the level of the common femoral vein and including the common femoral, femoral, profunda femoral, popliteal and calf veins including the posterior tibial, peroneal and gastrocnemius veins when visible. The superficial great saphenous vein was also interrogated. Spectral Doppler was utilized to evaluate flow at rest and with distal augmentation maneuvers in the common femoral, femoral and popliteal veins. COMPARISON:  None Available. FINDINGS: RIGHT LOWER EXTREMITY Common Femoral Vein: No evidence of thrombus. Normal  compressibility, respiratory phasicity and response to augmentation. Saphenofemoral Junction: No evidence of thrombus. Normal compressibility and flow on color Doppler imaging. Profunda Femoral Vein: No evidence of thrombus. Normal compressibility and flow on color Doppler imaging. Femoral Vein: No evidence of thrombus. Normal compressibility, respiratory phasicity and response to augmentation. Popliteal Vein: No evidence of thrombus. Normal compressibility, respiratory phasicity and response to augmentation. Calf Veins: No evidence of thrombus. Normal compressibility and flow on color Doppler imaging. Superficial Great Saphenous Vein: No evidence of thrombus. Normal compressibility. Other Findings:  None. LEFT LOWER EXTREMITY Common Femoral Vein: No evidence of thrombus. Normal compressibility, respiratory phasicity and response to augmentation. Saphenofemoral Junction: No evidence of thrombus. Normal compressibility and flow on color Doppler imaging. Profunda Femoral Vein: No evidence of thrombus. Normal compressibility and flow on color Doppler imaging. Femoral Vein: No evidence of thrombus. Normal compressibility, respiratory phasicity and response to augmentation. Popliteal Vein: No evidence of thrombus. Normal compressibility, respiratory phasicity and response to augmentation. Calf Veins: No evidence of thrombus. Normal compressibility and flow on color Doppler imaging. Superficial Great Saphenous Vein: No evidence of thrombus. Normal compressibility. Other Findings:  None. IMPRESSION: No evidence of DVT within either lower extremity. Electronically Signed   By: Jonny Ruiz  Watts M.D.   On: 11/20/2023 08:49   CT HEAD WO CONTRAST ( ) Result Date: 11/19/2023 CLINICAL DATA:  Head trauma EXAM: CT HEAD WITHOUT CONTRAST TECHNIQUE: Contiguous axial images were obtained from the base of the skull through the vertex without intravenous contrast. RADIATION DOSE REDUCTION: This exam was performed according to the  departmental dose-optimization program which includes automated exposure control, adjustment of the mA and/or kV according to patient size and/or use of iterative reconstruction technique. COMPARISON:  None Available. FINDINGS: Brain: No mass,hemorrhage or extra-axial collection. Normal appearance of the parenchyma and CSF spaces. Vascular: No hyperdense vessel or unexpected vascular calcification. Skull: The visualized skull base, calvarium and extracranial soft tissues are normal. Sinuses/Orbits: No fluid levels or advanced mucosal thickening of the visualized paranasal sinuses. No mastoid or middle ear effusion. Normal orbits. IMPRESSION: Normal head CT. Electronically Signed   By: Deatra Robinson M.D.   On: 11/19/2023 21:50   DG Chest Port 1 View Result Date: 11/19/2023 CLINICAL DATA:  [id number] Pneumothorax [id number] EXAM: PORTABLE CHEST 1 VIEW COMPARISON:  Chest x-ray 11/19/2023, CT chest 07/15/2023 FINDINGS: Endotracheal tube with tip 2 cm above the carina. Enteric tube courses below the hemidiaphragm with tip and side port collimated off view. Left internal jugular central venous catheter with tip overlying the right atrium. Interval placement of the right chest tube with pigtail overlying the paramediastinal right hemithorax. The heart and mediastinal contours are unchanged. Interval worsening of bilateral, right greater than left, patchy airspace and interstitial opacities. No pleural effusion. Interval resolution of right pneumothorax. No pneumothorax bilaterally. Redemonstration of right rib fractures. Partially visualized reverse total shoulder arthroplasty with antibiotic impregnated beads overlying the glenohumeral joint space. Skin staples overlie the right shoulder. IMPRESSION: 1. Interval placement of the right chest tube with pigtail overlying the paramediastinal right hemithorax with interval resolution of right pneumothorax. 2. Interval worsening of bilateral, right greater than left, patchy airspace  and interstitial opacities. 3. Left internal jugular central venous catheter with tip overlying the right atrium. Catheter could be retracted by 3-4 cm. 4. Other lines and tubes as above. 5.  Redemonstration of right rib fractures. Electronically Signed   By: Tish Frederickson M.D.   On: 11/19/2023 16:22   DG Chest Port 1 View Result Date: 11/19/2023 CLINICAL DATA:  Central line placement. EXAM: PORTABLE CHEST 1 VIEW COMPARISON:  08/31/2023.  CT, 07/15/2023. FINDINGS: There is a right-sided pneumothorax, estimated at 30%. No left pneumothorax. Dense opacity is noted throughout the partly collapsed right lung. Interstitial prominence and hazy opacities noted in the left lung. Probable small right effusion. Endotracheal tube tip projects 2.1 cm above the carina. Nasal/orogastric tube passes below the diaphragm well into the stomach. Left internal jugular dual lumen central venous catheter tip lies in the lower superior vena cava. Cardiac silhouette normal in size. Displaced acute appearing fracture of the left lateral fifth rib. Right shoulder arthroplasty projects inferior to the glenoid, possibly dislocated. IMPRESSION: 1. Approximally 30% right-sided pneumothorax. 2. Well-positioned endotracheal tube, left internal jugular central venous line and nasogastric tube. 3. Significant right lung consolidation accentuated by partial collapse. Consider pneumonia in the proper clinical setting. 4. Acute appearing displaced lateral left fifth rib fracture. Inferior displacement/possible dislocation of the right shoulder prosthesis. Critical Value/emergent results were called by telephone at the time of interpretation on 11/19/2023 at 3:03 pm to the patient's ICU nurse Madilyn Fireman, who verbally acknowledged these results. Electronically Signed   By: Amie Portland M.D.   On: 11/19/2023 15:07   DG Humerus  Right Result Date: 11/18/2023 CLINICAL DATA:  Proximal right humeral fracture EXAM: RIGHT HUMERUS - 2+ VIEW COMPARISON:   11/14/2023 CT FLUOROSCOPY TIME:  Radiation Exposure Index (as provided by the fluoroscopic device): Not provided If the device does not provide the exposure index: Fluoroscopy Time:  42 seconds Number of Acquired Images:  2 FINDINGS: Interval revision of the proximal right humeral prosthesis is noted. Antibiotic beads are also noted. Changes of prior proximal humeral fracture are identified. Anatomic alignment of the prosthesis is seen. IMPRESSION: Status post revision of right shoulder arthroplasty Electronically Signed   By: Alcide Clever M.D.   On: 11/18/2023 20:27   DG Shoulder Right Port Result Date: 11/18/2023 CLINICAL DATA:  Right shoulder arthroplasty revision EXAM: RIGHT SHOULDER - 1 VIEW COMPARISON:  10/03/2023 FINDINGS: Internal rotation, external rotation, and transscapular views of the right shoulder are obtained on 4 images. Interval revision of right shoulder arthroplasty, in the expected position without evidence of acute complication. Methylmethacrylate and antibiotic impregnated beads surround the humeral component of the arthroplasty. Surgical drain in the overlying soft tissues. Visualized portions of the right chest are clear. IMPRESSION: 1. Interval revision of right shoulder arthroplasty as above. Anatomic alignment. Electronically Signed   By: Sharlet Salina M.D.   On: 11/18/2023 19:10   DG C-Arm 1-60 Min-No Report Result Date: 11/18/2023 Fluoroscopy was utilized by the requesting physician.  No radiographic interpretation.   DG C-Arm 1-60 Min-No Report Result Date: 11/18/2023 Fluoroscopy was utilized by the requesting physician.  No radiographic interpretation.   [place] OR NERVE BLOCK-IMAGE ONLY [facility]) Result Date: 11/18/2023 There is no interpretation for this exam.  This order is for images obtained during a surgical procedure.  Please See "Surgeries" Tab for more information regarding the procedure.   CT SHOULDER RIGHT WO CONTRAST Result Date: 11/14/2023 CLINICAL DATA:  Revision  of total right shoulder arthroplasty with long stem humeral component and cerclage wires. EXAM: CT OF THE UPPER RIGHT EXTREMITY WITHOUT CONTRAST TECHNIQUE: Multidetector CT imaging of the upper right extremity was performed according to the standard protocol. RADIATION DOSE REDUCTION: This exam was performed according to the departmental dose-optimization program which includes automated exposure control, adjustment of the mA and/or kV according to patient size and/or use of iterative reconstruction technique. COMPARISON:  Radiographs 10/03/2023 FINDINGS: The glenosphere is intact. No complicating features associated with the hardware. New long-stem humeral component with cerclage wires around the upper portion. The right hemithorax bony structures are otherwise intact. No rib fractures. The right lung is grossly clear. Expected hematoma surrounding the humerus in the arm musculature. IMPRESSION: 1. New long-stem humeral component with cerclage wires around the upper portion. 2. No complicating features associated with the new hardware. 3. Healing humerus fractures. 4. Expected hematoma surrounding the humerus in the arm musculature. Electronically Signed   By: Rudie Meyer M.D.   On: 11/14/2023 19:41    Labs:  CBC: Recent Labs    11/27/23 0322 11/28/23 0430 11/29/23 0429 11/30/23 0727  WBC 12.6* 14.1* 15.5* 17.6*  HGB 7.9* 8.3* 7.8* 7.9*  HCT 26.4* 26.8* 25.1* 26.1*  PLT 374 413* 419* 418*    COAGS: Recent Labs    08/31/23 1535 11/19/23 1310 11/22/23 1247  INR 1.1 1.4* 1.3*  APTT 31  --  45*    BMP: Recent Labs    11/27/23 0322 11/28/23 0430 11/29/23 0429 11/30/23 0727  NA 144 145 149* 148*  K 4.3 3.9 4.3 4.6  CL 106 107 111 112*  CO2 24 25  23 21*  GLUCOSE 119* 111* 110* 102*  BUN 83* 78* 81* 85*  CALCIUM 9.2 9.7 9.6 9.2  CREATININE 2.52* 2.52* 2.80* 3.03*  GFRNONAA 20* 20* 18* 16*    LIVER FUNCTION TESTS: Recent Labs    11/19/23 2039 11/20/23 0252 11/21/23 0402  11/22/23 0407 11/26/23 0434 11/27/23 0322 11/28/23 0430 11/29/23 0429 11/30/23 0727  BILITOT 0.6 0.7 0.8 0.9  --   --   --   --   --   AST 122* 102* 51* 36  --   --   --   --   --   ALT 54* 41 20 16  --   --   --   --   --   ALKPHOS 124 115 120 152*  --   --   --   --   --   PROT 6.7 6.3* 6.1* 6.0*  --   --   --   --   --   ALBUMIN 2.6* 2.6* 2.4* 2.1*   < > 2.3* 2.7* 2.6* 2.5*   < > = values in this interval not displayed.    TUMOR MARKERS: No results for input(s): "AFPTM", "CEA", "CA199", "CHROMGRNA" in the last 8760 hours.  Assessment and Plan: Tonya Cummings is a 72 y.o. female who presented to [facility] on 12/06 for right reverse shoulder arthroplasty after for suspicion of septic joint. Her stay was notably complicated by sepsis, PAE arrest, pneumothorax with chest tube placement. During ICU stay patient had circulatory shock requiring pressors. She is successfully extubated 12/13 and transferred to [facility] service. She remains intermittently alert, but somnolent.  She presents in IR today for percutaneous gastrostomy placement.   Risks and benefits image guided gastrostomy tube placement was discussed with the patient including, but not limited to the need for a barium enema during the procedure, bleeding, infection, peritonitis and/or damage to adjacent structures.  All of the patient and her husband's questions were answered, they are agreeable to proceed.  Consent signed and in IR control room.    Thank you for this interesting consult.  I greatly enjoyed meeting Tonya Cummings and look forward to participating in their care.  A copy of this report was sent to the requesting provider on this date.  Electronically Signed: Sable Feil, PA-C 11/30/2023, 9:31 AM   I spent a total of 20 Minutes    in face to face in clinical consultation, greater than 50% of which was counseling/coordinating care for percutaneous gastrostomy placement.

## 2023-11-30 NOTE — Progress Notes (Signed)
Daily Progress Note   Patient Name: Tonya Cummings       Date: 11/30/2023 DOB: 1951/03/16  Age: 72 y.o. MRN#: [id number] Attending Physician: Darlin Priestly, MD Primary Care Physician: Myrene Buddy, NP Admit Date: 11/18/2023  Reason for Consultation/Follow-up: Establishing goals of care  Subjective: Notes and labs reviewed.  In to see patient.  No family at bedside.  Patient is currently lying in bed with eyes closed.  She has mittens in place and appears to be sleeping but restless.  Plans in place for a PEG today. PMT will continue to follow.  Length of Stay: 12  Current Medications: Scheduled Meds:   aspirin  325 mg Oral Daily   Chlorhexidine Gluconate Cloth  6 each Topical Daily   lidocaine  1 patch Transdermal Q24H   metoprolol tartrate  25 mg Oral BID   mupirocin ointment   Topical BID   pramipexole  0.125 mg Oral q morning   And   pramipexole  0.25 mg Oral QHS   sodium chloride flush  3 mL Intravenous Q12H   sodium chloride flush  3 mL Intravenous Q12H    Continuous Infusions:  amiodarone 30 mg/hr (11/30/23 1350)    ceFAZolin (ANCEF) IV     cefTRIAXone (ROCEPHIN)  IV Stopped (11/29/23 1925)   DAPTOmycin Stopped (11/28/23 1428)   dextrose 50 mL/hr at 11/30/23 1350   heparin 1,850 Units/hr (11/30/23 1350)   micafungin (MYCAMINE) 100 mg in sodium chloride 0.9 % 100 mL IVPB      PRN Meds: bisacodyl, ipratropium-albuterol, metoprolol tartrate, morphine injection, mouth rinse, mouth rinse, polyethylene glycol, senna-docusate  Physical Exam Constitutional:      Comments: Eyes closed.  Restless.             Vital Signs: BP (!) 116/101 (BP Location: Left Arm)   Pulse 93   Temp (!) 97.3 F (36.3 C)   Resp 16   Ht 5\' 3"  (1.6 m)   Wt 76 kg   SpO2 98%   BMI  29.68 kg/m  SpO2: SpO2: 98 % O2 Device: O2 Device: Nasal Cannula O2 Flow Rate: O2 Flow Rate (L/min): 4 L/min  Intake/output summary:  Intake/Output Summary (Last 24 hours) at 11/30/2023 1357 Last data filed at 11/30/2023 1350 Gross per 24 hour  Intake 2486.16 ml  Output 750  ml  Net 1736.16 ml   LBM: Last BM Date : 11/27/23 Baseline Weight: Weight: 77.1 kg Most recent weight: Weight: 76 kg    Patient Active Problem List   Diagnosis Date Noted   Atrial flutter (HCC) 11/29/2023   Atrial fibrillation with RVR (HCC) 11/26/2023   Cardiac arrest, cause unspecified (HCC) 11/21/2023   Tension pneumothorax 11/19/2023   Septic arthritis (HCC) 11/18/2023   Acute on chronic anemia 11/18/2023   (HFpEF) heart failure with preserved ejection fraction (HCC) 11/18/2023   CKD (chronic kidney disease) stage 4, GFR 15-29 ml/min (HCC) 11/18/2023   ILD (interstitial lung disease) (HCC) 11/18/2023   Periprosthetic fracture around internal prosthetic right shoulder joint 10/03/2023   Renal mass, right 08/31/2023   Leukocytosis 08/31/2023   OSA (obstructive sleep apnea) 08/31/2023   Elevated troponin 08/31/2023   Leg pain, bilateral 08/31/2023   At risk for constipation 08/31/2023   Rotator cuff arthropathy 08/30/2023   Anemia in stage 4 chronic kidney disease (HCC) 01/10/2023   Long-term use of immunosuppressant medication 03/09/2022   Dyspnea 09/29/2021   Acute CHF (congestive heart failure) (HCC) 09/29/2021   Acute respiratory failure with hypoxia (HCC) 09/29/2021   Total knee replacement status 10/13/2020   CKD (chronic kidney disease) stage 3, GFR 30-59 ml/min (HCC) 10/12/2020   Hyperlipidemia 10/12/2020   Hypertension 10/12/2020   Migraine headache 10/12/2020   Restless legs syndrome (RLS) 10/12/2020   Severe obesity (BMI 35.0-39.9) with comorbidity (HCC) 06/30/2020   Primary osteoarthritis of right knee 03/27/2020   Iron deficiency anemia 09/12/2019   Low vitamin B12 level  09/12/2019   Normocytic anemia 09/07/2019   Right medial knee pain 08/09/2018   Depression with anxiety 03/22/2018   Sleep disorder 03/22/2018   Influenza A 02/17/2018   Community acquired pneumonia 02/17/2018   Chronic radicular pain of lower back 02/06/2018   Numbness and tingling of right leg 02/06/2018   Hip dysplasia 02/23/2016   Osteoporosis, post-menopausal 02/23/2016   Raynaud's disease without gangrene 02/23/2016   Seropositive rheumatoid arthritis (HCC) 02/23/2016   Lumbar facet joint pain 09/18/2014    Palliative Care Assessment & Plan     Recommendations/Plan: Full code/full scope Plans for PEG tube today Time for outcomes  Code Status:    Code Status Orders  (From admission, onward)           Start     Ordered   11/18/23 2005  Full code  Continuous       Question:  By:  Answer:  Consent: discussion documented in EHR   11/18/23 2004           Code Status History     Date Active Date Inactive Code Status Order ID Comments User Context   11/18/2023 1831 11/18/2023 2004 Full Code [id number]  Verdene Lennert, MD Inpatient   10/03/2023 1703 10/04/2023 1920 Full Code [id number]  Signa Kell, MD Inpatient   08/31/2023 1400 09/02/2023 2010 Full Code [id number]  CoxNadyne Coombes, DO Inpatient   08/30/2023 1257 08/31/2023 1400 Full Code [id number]  Signa Kell, MD Inpatient   09/29/2021 1413 10/02/2021 2034 Full Code [id number]  Lurene Shadow, MD ED   10/13/2020 1640 10/15/2020 1951 Full Code [id number]  Donato Heinz, MD Inpatient   02/17/2018 1650 02/20/2018 1818 Full Code [id number]  Altamese Dilling, MD Inpatient       Prognosis:  Unable to determine    Thank you for allowing the Palliative Medicine Team to assist in the care of this patient.  Morton Stall, NP  Please contact Palliative Medicine Team phone at [phone] for questions and concerns.

## 2023-11-30 NOTE — Progress Notes (Addendum)
Nutrition Follow-up  DOCUMENTATION CODES:   Not applicable  INTERVENTION:   -TF via g-tube (start 12/01/23 at 1000)  Initiate Osmolite 1.2 @ 25 ml/hr and increase by 10 ml every 8 hours to goal rate of 65 ml/hr.   Free water flushes 100 ml every 4 hours   Tube feeding regimen provides 1872 kcal (100% of needs), 87 grams of protein, and 1276 ml of H2O. Total free water: 1879 ml daily  -Monitor Mg, K, and Phos and replete as needed secondary to high refeeding risk -100 mg thiamine daily x 7 days -MVI with minerals daily -Case discussed with MD; requested pharmacy consult for electrolyte management   NUTRITION DIAGNOSIS:   Inadequate oral intake related to inability to eat (pt sedated and ventilated) as evidenced by NPO status.  Ongoing  GOAL:   Patient will meet greater than or equal to 90% of their needs  Progressing   MONITOR:   Diet advancement, Labs, Weight trends, I & O's, Skin  REASON FOR ASSESSMENT:   Consult Enteral/tube feeding initiation and management  ASSESSMENT:   72 y/o female with h/o hiatal hernia, GERD, RLS, OSA, IDA, depression, anxiety, HLD, HTN, PAF, CKD IV, CHF, ILD, DDD, RA, right total knee arthroplasty (2021) and renal cysts who is admitted with septic joint s/p right reverse shoulder arthroplasty 12/6 complicated by PEA arrest, AKI, NSTEMI and pneumothorax 12/7.  12/17- NPO 12/18- s/p PEG placement  Reviewed I/O's: +856 ml x 24 hours and -1.3 L since admission  UOP: 1.1 L x 24 hours   Case discussed with MD; PEG has been placed and plan to start TF tomorrow AM. RD received permission to start TF tomorrow after 10 AM.   Palliative care following for goals of care.   Medications reviewed and include dextrose 5% solution @ 50 ml/hr and amiodarone.   Labs reviewed: Na: 148, CBGS: 69-108 (inpatient orders for glycemic control are none).    Diet Order:   Diet Order             Diet NPO time specified Except for: Ice Chips  Diet effective  now                   EDUCATION NEEDS:   No education needs have been identified at this time  Skin:  Skin Assessment: Skin Integrity Issues: Skin Integrity Issues:: Stage I Stage I: bilateral buttocks Incisions: closed rt shoulder  Last BM:  11/27/23  Height:   Ht Readings from Last 1 Encounters:  11/21/23 5\' 3"  (1.6 m)    Weight:   Wt Readings from Last 1 Encounters:  11/30/23 76 kg    Ideal Body Weight:  52 kg  BMI:  Body mass index is 29.68 kg/m.  Estimated Nutritional Needs:   Kcal:  1700-1900kcal/day  Protein:  85-95g/day  Fluid:  > 1.6 L    Levada Schilling, RD, LDN, CDCES Registered Dietitian III Certified Diabetes Care and Education Specialist If unable to reach this RD, please use "RD Inpatient" group chat on secure chat between hours of 8am-4 pm daily

## 2023-12-01 ENCOUNTER — Encounter: Payer: Self-pay | Admitting: Nurse Practitioner

## 2023-12-01 ENCOUNTER — Telehealth (HOSPITAL_COMMUNITY): Payer: Self-pay

## 2023-12-01 ENCOUNTER — Other Ambulatory Visit (HOSPITAL_COMMUNITY): Payer: Self-pay

## 2023-12-01 DIAGNOSIS — N184 Chronic kidney disease, stage 4 (severe): Secondary | ICD-10-CM | POA: Diagnosis not present

## 2023-12-01 DIAGNOSIS — M9731XA Periprosthetic fracture around internal prosthetic right shoulder joint, initial encounter: Secondary | ICD-10-CM | POA: Diagnosis not present

## 2023-12-01 DIAGNOSIS — I469 Cardiac arrest, cause unspecified: Secondary | ICD-10-CM | POA: Diagnosis not present

## 2023-12-01 DIAGNOSIS — I10 Essential (primary) hypertension: Secondary | ICD-10-CM | POA: Diagnosis not present

## 2023-12-01 DIAGNOSIS — D509 Iron deficiency anemia, unspecified: Secondary | ICD-10-CM | POA: Diagnosis not present

## 2023-12-01 DIAGNOSIS — M059 Rheumatoid arthritis with rheumatoid factor, unspecified: Secondary | ICD-10-CM | POA: Diagnosis not present

## 2023-12-01 DIAGNOSIS — Z22322 Carrier or suspected carrier of Methicillin resistant Staphylococcus aureus: Secondary | ICD-10-CM | POA: Diagnosis not present

## 2023-12-01 DIAGNOSIS — I4891 Unspecified atrial fibrillation: Secondary | ICD-10-CM | POA: Diagnosis not present

## 2023-12-01 DIAGNOSIS — Z7189 Other specified counseling: Secondary | ICD-10-CM | POA: Diagnosis not present

## 2023-12-01 LAB — BLOOD GAS, VENOUS
Acid-base deficit: 7.5 mmol/L — ABNORMAL HIGH (ref 0.0–2.0)
Bicarbonate: 18.8 mmol/L — ABNORMAL LOW (ref 20.0–28.0)
O2 Saturation: 75.3 %
Patient temperature: 37
pCO2, Ven: 40 mm[Hg] — ABNORMAL LOW (ref 44–60)
pH, Ven: 7.28 (ref 7.25–7.43)
pO2, Ven: 47 mm[Hg] — ABNORMAL HIGH (ref 32–45)

## 2023-12-01 LAB — BLOOD GAS, ARTERIAL
Acid-base deficit: 2.7 mmol/L — ABNORMAL HIGH (ref 0.0–2.0)
Bicarbonate: 22.6 mmol/L (ref 20.0–28.0)
FIO2: 38 %
O2 Content: 40 L/min
O2 Saturation: 99.3 %
Patient temperature: 37
pCO2 arterial: 40 mm[Hg] (ref 32–48)
pH, Arterial: 7.36 (ref 7.35–7.45)
pO2, Arterial: 120 mm[Hg] — ABNORMAL HIGH (ref 83–108)

## 2023-12-01 LAB — COMPREHENSIVE METABOLIC PANEL
ALT: 891 U/L — ABNORMAL HIGH (ref 0–44)
AST: 1921 U/L — ABNORMAL HIGH (ref 15–41)
Albumin: 2.6 g/dL — ABNORMAL LOW (ref 3.5–5.0)
Alkaline Phosphatase: 363 U/L — ABNORMAL HIGH (ref 38–126)
Anion gap: 17 — ABNORMAL HIGH (ref 5–15)
BUN: 104 mg/dL — ABNORMAL HIGH (ref 8–23)
CO2: 18 mmol/L — ABNORMAL LOW (ref 22–32)
Calcium: 8.2 mg/dL — ABNORMAL LOW (ref 8.9–10.3)
Chloride: 105 mmol/L (ref 98–111)
Creatinine, Ser: 4.43 mg/dL — ABNORMAL HIGH (ref 0.44–1.00)
GFR, Estimated: 10 mL/min — ABNORMAL LOW (ref >60)
Glucose, Bld: 146 mg/dL — ABNORMAL HIGH (ref 70–99)
Potassium: 4.8 mmol/L (ref 3.5–5.1)
Sodium: 140 mmol/L (ref 135–145)
Total Bilirubin: 0.7 mg/dL (ref <1.2)
Total Protein: 6.7 g/dL (ref 6.5–8.1)

## 2023-12-01 LAB — MAGNESIUM
Magnesium: 2.4 mg/dL (ref 1.7–2.4)
Magnesium: 2.5 mg/dL — ABNORMAL HIGH (ref 1.7–2.4)

## 2023-12-01 LAB — BASIC METABOLIC PANEL
Anion gap: 17 — ABNORMAL HIGH (ref 5–15)
BUN: 98 mg/dL — ABNORMAL HIGH (ref 8–23)
CO2: 18 mmol/L — ABNORMAL LOW (ref 22–32)
Calcium: 8.8 mg/dL — ABNORMAL LOW (ref 8.9–10.3)
Chloride: 109 mmol/L (ref 98–111)
Creatinine, Ser: 3.88 mg/dL — ABNORMAL HIGH (ref 0.44–1.00)
GFR, Estimated: 12 mL/min — ABNORMAL LOW (ref >60)
Glucose, Bld: 100 mg/dL — ABNORMAL HIGH (ref 70–99)
Potassium: 4.8 mmol/L (ref 3.5–5.1)
Sodium: 144 mmol/L (ref 135–145)

## 2023-12-01 LAB — GLUCOSE, CAPILLARY
Glucose-Capillary: 130 mg/dL — ABNORMAL HIGH (ref 70–99)
Glucose-Capillary: 138 mg/dL — ABNORMAL HIGH (ref 70–99)
Glucose-Capillary: 81 mg/dL (ref 70–99)
Glucose-Capillary: 86 mg/dL (ref 70–99)
Glucose-Capillary: 87 mg/dL (ref 70–99)
Glucose-Capillary: 93 mg/dL (ref 70–99)

## 2023-12-01 LAB — CBC
HCT: 24.5 % — ABNORMAL LOW (ref 36.0–46.0)
Hemoglobin: 7.5 g/dL — ABNORMAL LOW (ref 12.0–15.0)
MCH: 28.7 pg (ref 26.0–34.0)
MCHC: 30.6 g/dL (ref 30.0–36.0)
MCV: 93.9 fL (ref 80.0–100.0)
Platelets: 331 10*3/uL (ref 150–400)
RBC: 2.61 MIL/uL — ABNORMAL LOW (ref 3.87–5.11)
RDW: 18.3 % — ABNORMAL HIGH (ref 11.5–15.5)
WBC: 36.6 10*3/uL — ABNORMAL HIGH (ref 4.0–10.5)
nRBC: 1.4 % — ABNORMAL HIGH (ref 0.0–0.2)

## 2023-12-01 LAB — HEMOGLOBIN: Hemoglobin: 8 g/dL — ABNORMAL LOW (ref 12.0–15.0)

## 2023-12-01 LAB — PHOSPHORUS
Phosphorus: 6.9 mg/dL — ABNORMAL HIGH (ref 2.5–4.6)
Phosphorus: 6.4 mg/dL — ABNORMAL HIGH (ref 2.5–4.6)

## 2023-12-01 LAB — HEPARIN LEVEL (UNFRACTIONATED): Heparin Unfractionated: 0.15 [IU]/mL — ABNORMAL LOW (ref 0.30–0.70)

## 2023-12-01 MED ORDER — PRAMIPEXOLE DIHYDROCHLORIDE 0.25 MG PO TABS
0.2500 mg | ORAL_TABLET | Freq: Every day | ORAL | Status: DC
  Administered 2023-12-01: 0.25 mg
  Filled 2023-12-01: qty 1

## 2023-12-01 MED ORDER — PRAMIPEXOLE DIHYDROCHLORIDE 0.25 MG PO TABS
0.1250 mg | ORAL_TABLET | Freq: Every morning | ORAL | Status: DC
  Filled 2023-12-01: qty 0.5

## 2023-12-01 MED ORDER — FREE WATER
250.0000 mL | Freq: Once | Status: AC
  Administered 2023-12-01: 250 mL

## 2023-12-01 MED ORDER — ASPIRIN 81 MG PO CHEW: 81.0000 mg | CHEWABLE_TABLET | Freq: Every day | ORAL | Status: DC

## 2023-12-01 MED ORDER — HEPARIN BOLUS VIA INFUSION
2000.0000 [IU] | Freq: Once | INTRAVENOUS | Status: AC
  Administered 2023-12-01: 2000 [IU] via INTRAVENOUS
  Filled 2023-12-01: qty 2000

## 2023-12-01 MED ORDER — METOPROLOL TARTRATE 25 MG PO TABS: 12.5000 mg | ORAL_TABLET | Freq: Two times a day (BID) | ORAL | Status: DC

## 2023-12-01 MED ORDER — APIXABAN 5 MG PO TABS
5.0000 mg | ORAL_TABLET | Freq: Two times a day (BID) | ORAL | Status: DC
  Administered 2023-12-01 (×2): 5 mg
  Filled 2023-12-01 (×2): qty 1

## 2023-12-01 MED ORDER — METOPROLOL TARTRATE 25 MG/10 ML ORAL SUSPENSION: 25.0000 mg | Freq: Two times a day (BID) | ORAL | Status: DC

## 2023-12-01 MED ORDER — ASPIRIN 325 MG PO TABS: 325.0000 mg | ORAL_TABLET | Freq: Every day | ORAL | Status: DC

## 2023-12-01 MED ORDER — LACTATED RINGERS IV BOLUS: 500.0000 mL | Freq: Once | INTRAVENOUS | Status: DC

## 2023-12-01 NOTE — Progress Notes (Addendum)
Progress Note   Patient: Tonya Cummings:[id number] DOB: Jun 13, 1951 DOA: 11/18/2023     13 DOS: the patient was seen and examined on 12/01/2023   Brief hospital course: 72 year old female patient with a past medical history of CKD stage IV, heart failure with preserved EF, hypertension, rheumatoid arthritis, OSA not on CPAP presents to [facility] on 12/06 for right reverse shoulder arthroplasty after for suspicion of septic joint. She initially underwent right shoulder arthroplasty on 08/30/2023 after which she developed a.  Periprosthetic fracture status post conversion to a longstem humeral component with ORIF on 10/03/2023.  No evidence of infection at the time. Follow-up lab work showed elevated inflammatory markers including ESR suspicious for an infectious process.  Therefore she presents back on 12/06 for shoulder arthroplasty explant of glenoid and humeral component open treatment of right humerus fracture with intramedullary device and insertion of antibiotic delivery system.  Small area of purulence was noted.  Cultures were sent.  Started on daptomycin and ceftriaxone.   She did well immediately postop however on 12/07 she was getting up to the bedside commode she went into respiratory distress with O2 sat dropping becoming apneic and went into PEA arrest.  CPR for roughly 5 to 6-minute prior to ROSC epi x 2.  Not shockable rhythm.  Patient intubated and transferred to the ICU for further care.  Postintubation chest x-ray with right pneumothorax status post chest tube placement 12/07.  HD catheter placed 12/07 anticipating need for dialysis given worsening kidney function.    During ICU stay patient had circulatory shock requiring pressors.  She has elevated troponin in the setting of cardiac arrest, new onset A-fib.  Echocardiogram 12/08 with normal LVEF 55 to 60%.  RV systolic function is normal and size is normal. Ultrasound venous lower extremity 12/08 negative  for DVT. Patient had pneumothorax 12/7 for which a right chest tube was placed which is removed 12/11.  Her kidney function remained stable and nephrologist followed advised no acute indication for dialysis. She is successfully extubated 12/13 and transferred to [facility] service.  Assessment and Plan: PEA arrest NSTEMI New onset A-fib: Circulatory shock, off pressors transferred out of ICU 12/13. Echo showed EF 55-60%.  She was on amiodarone gtt for rapid A-fib per cardiology recommendations.  Heart rate today stable at 60-80. Amio gtt transitioned to oral Amiodarone 12/18. IV heparin changed to oral Eliquis treatment per cardiology team.   Acute hypoxic respiratory failure Right pneumothorax Status post right-sided chest tube placed 12/7, removed 12/11. Post extubation 12/13 Possible aspiration pneumonia Patient is currently on 1-2 L supplemental oxygen. Continue aggressive pulmonary toilet, DuoNebs as needed. Encourage out of bed to chair, incentive spirometry. Continue supplemental O2 to keep sats >=92%, wean as tolerated   Acute toxic metabolic encephalopathy Hospital delirium in the setting of PEA arrest, respiratory failure, requiring sedation. She is also on morphine which I stopped due to being lethargic, sleepy. Discussed with pharmacy. Continue neuro checks, supportive care   Dysphagia: Hoarseness s/p extubation. SLP evaluation and rec NPO due to risk of aspiration. G tube placed by radiology. Will start feeds, free water today as per RD recs.  Hypernatremia Na improved to 144. Continue free water thru G -tube. Will stop D5 once able to tolerate feeds.   Acute on chronic kidney injury stage 4: Acute kidney injury in the setting of hypoperfusion. Nephrology on board. Avoid nephrotoxic drugs.   Continue gentle IV fluids, feeds thru G -tube. Monitor daily renal function. Monitor daily  urine output.   Right reverse shoulder arthroplasty 9/17, status post revision surgery  on 1021, 11/18/2023- Hardware explanted 12/6.  ID on board. Candida albicans in 2 cultures identified. cont ceftriaxone and dapto. Her white count very high. Add micafungin, per ID as increased risk of QT prolongation with fluconazole and Amio.   Anemia of chronic disease: Hemoglobin stable around 7.5.  No active bleeding. Will resume iron, B12 supplements thru G tube.   Hypoglycemia Due to no oral intake, npo status Continue accu checks q6h to monitor for hypogylcemia Tube feeds to improved blood sugars.   Obesity with BMI 31.40 Contributing to her current condition. OSA not on CPAP. Diet, exercise and weight reduction advised.     Debility and deconditioning: She is very weak, not getting out of bed. PT OT follow up. She will likely need STR. Discussed with husband.        Out of bed to chair. Incentive spirometry. Nursing supportive care. Fall, aspiration precautions. DVT prophylaxis   Code Status: Full Code  Subjective: Patient is seen and examined today morning. She is more sleepy and lethargic. Able to tell her name slowly, goes to sleep easily. Husband at bedside.  Physical Exam: Vitals:   12/01/23 0322 12/01/23 0500 12/01/23 0816 12/01/23 1236  BP: (!) 131/94  (!) 107/46 (!) 98/45  Pulse: 60     Resp: (!) 24  15 18   Temp: 97.7 F (36.5 C)  98.3 F (36.8 C) 98.4 F (36.9 C)  TempSrc: Axillary  Oral Oral  SpO2: 100%  100% 99%  Weight:  77.9 kg    Height:        General - Elderly obese Caucasian female, sleepy and lethargic, respiratory distress, hoarse voice HEENT - PERRLA, EOMI, atraumatic head, non tender sinuses. Lung - distant breath sounds, basal rales, rhonchi, no wheezes. Heart - S1, S2 heard, no murmurs, rubs, 1+ pedal edema. Abdomen - Soft, non tender obese, bowel sounds good Neuro - sleepy and lethargic, slow mentation, moving extremities. Skin - Warm and dry.  Data Reviewed:      Latest Ref Rng & Units 12/01/2023    6:29 AM  11/30/2023    7:27 AM 11/29/2023    4:29 AM  CBC  WBC 4.0 - 10.5 K/uL 36.6  17.6  15.5   Hemoglobin 12.0 - 15.0 g/dL 7.5  7.9  7.8   Hematocrit 36.0 - 46.0 % 24.5  26.1  25.1   Platelets 150 - 400 K/uL 331  418  419       Latest Ref Rng & Units 12/01/2023    6:29 AM 11/30/2023    7:27 AM 11/29/2023    4:29 AM  BMP  Glucose 70 - 99 mg/dL 324  401  027   BUN 8 - 23 mg/dL 98  85  81   Creatinine 0.44 - 1.00 mg/dL 2.53  6.64  4.03   Sodium 135 - 145 mmol/L 144  148  149   Potassium 3.5 - 5.1 mmol/L 4.8  4.6  4.3   Chloride 98 - 111 mmol/L 109  112  111   CO2 22 - 32 mmol/L 18  21  23    Calcium 8.9 - 10.3 mg/dL 8.8  9.2  9.6    IR GASTROSTOMY TUBE MOD SED Result Date: 11/30/2023 INDICATION: 72 year old female with dysphagia. She presents for percutaneous gastrostomy tube placement. EXAM: Fluoroscopically guided placement of percutaneous pull-through gastrostomy tube Interventional Radiologist:  Sterling Big, MD MEDICATIONS: 2 g Ancef; Antibiotics  were administered within 1 hour of the procedure. ANESTHESIA/SEDATION: Versed 1 mg IV; Fentanyl 50 mcg IV administered by the radiology nurse Moderate Sedation Time:  18 minutes The patient's vital signs and level of consciousness were continuously monitored during the procedure by the interventional radiology nurse under my direct supervision. CONTRAST:  10mL OMNIPAQUE IOHEXOL 300 MG/ML  SOLN FLUOROSCOPY: Radiation exposure index: 51 mGy reference air kerma COMPLICATIONS: None immediate. PROCEDURE: Informed written consent was obtained from the patient after a thorough discussion of the procedural risks, benefits and alternatives. All questions were addressed. Maximal Sterile Barrier Technique was utilized including caps, mask, sterile gowns, sterile gloves, sterile drape, hand hygiene and skin antiseptic. A timeout was performed prior to the initiation of the procedure. Maximal barrier sterile technique utilized including caps, mask, sterile  gowns, sterile gloves, large sterile drape, hand hygiene, and chlorhexadine skin prep. An angled catheter was advanced over a wire under fluoroscopic guidance through the nose, down the esophagus and into the body of the stomach. The stomach was then insufflated with several 100 ml of air. Fluoroscopy confirmed location of the gastric bubble, as well as inferior displacement of the barium stained colon. Under direct fluoroscopic guidance, a single T-tack was placed, and the anterior gastric wall drawn up against the anterior abdominal wall. Percutaneous access was then obtained into the mid gastric body with an 18 gauge sheath needle. Aspiration of air, and injection of contrast material under fluoroscopy confirmed needle placement. An Amplatz wire was advanced in the gastric body and the access needle exchanged for a 9-French vascular sheath. A snare device was advanced through the vascular sheath and an Amplatz wire advanced through the angled catheter. The Amplatz wire was successfully snared and this was pulled up through the esophagus and out the mouth. A 20-French Burnell Blanks MIC-PEG tube was then connected to the snare and pulled through the mouth, down the esophagus, into the stomach and out to the anterior abdominal wall. Hand injection of contrast material confirmed intragastric location. The T-tack retention suture was then cut. The pull through peg tube was then secured with the external bumper and capped. The patient will be observed for several hours with the newly placed tube on low wall suction to evaluate for any post procedure complication. The patient tolerated the procedure well, there is no immediate complication. IMPRESSION: Successful placement of a 20 French pull through gastrostomy tube. Electronically Signed   By: Malachy Moan M.D.   On: 11/30/2023 15:38   CT ABDOMEN PELVIS WO CONTRAST Result Date: 11/30/2023 CLINICAL DATA:  Dysphagia. Evaluate anatomy for gastrostomy tube  placement. EXAM: CT ABDOMEN AND PELVIS WITHOUT CONTRAST TECHNIQUE: Multidetector CT imaging of the abdomen and pelvis was performed following the standard protocol without IV contrast. RADIATION DOSE REDUCTION: This exam was performed according to the departmental dose-optimization program which includes automated exposure control, adjustment of the mA and/or kV according to patient size and/or use of iterative reconstruction technique. COMPARISON:  05/28/2023 and chest CT 07/15/2023 FINDINGS: Lower chest: Bilateral pleural effusions, right side larger than left. Patchy densities in the right lower lung. Infection cannot be excluded. Hepatobiliary: Gallbladder appears to be surgically absent. No focal liver abnormality. Common bile duct is prominent measuring 1.1 cm and measured approximately 0.7 cm on 05/28/2023. Pancreas: Unremarkable. No pancreatic ductal dilatation or surrounding inflammatory changes. Spleen: Normal in size without focal abnormality. Adrenals/Urinary Tract: Normal adrenal glands. Normal appearance of the left kidney without stones or hydronephrosis. Again noted is a central right renal mass  there is poorly defined on this study without intravascular contrast. Mass roughly measures 5.5 x 4.4 cm. Again noted is a slightly hyperdense exophytic structure in the right kidney lower pole. Foley catheter in the urinary bladder. Stomach/Bowel: Sigmoid colon extends into the upper abdomen and anterior to the distal stomach. Oral contrast within the colon. Moderate sized hiatal hernia with the stomach body posterior to the sigmoid colon and there are small bowel loops along the left side of the distal gastric body. No evidence for bowel dilatation or obstruction. No focal bowel inflammation. Vascular/Lymphatic: Aortic atherosclerosis. No enlarged abdominal or pelvic lymph nodes. Reproductive: Uterus and bilateral adnexa are unremarkable. Other: Moderate sized hiatal hernia. Spleen may be slightly pulled  into the hiatal hernia as well and this is similar to the previous CT examination. No evidence for ascites. Presacral edema. Diffuse subcutaneous edema. Musculoskeletal: Postsurgical changes in the pelvis with surgical plate and screw fixation of the pubic symphysis. Again noted is a surgical screw extending through bilateral SI joints and surgical screw in the left ilium. Markedly displaced fractures of the right fifth, sixth and seventh ribs. Also evidence for acute fractures involving the right eighth and ninth ribs. Patient has evidence of old right rib fractures. New left fractures including a displaced lateral left fifth rib fracture. Old vertebral body compression fractures at L1, T11 and T6. IMPRESSION: 1. Moderate sized hiatal hernia with colon anterior to the distal stomach. 2. Bilateral pleural effusions, right side greater than left. Patchy densities at the right lung base. Findings could be associated with atelectasis or infection. 3. Interval enlargement of common bile duct measuring up to 1.1 cm. Recommend correlation with liver function tests. 4. Right renal mass. This has been present on previous imaging examinations. 5. Subcutaneous edema and presacral edema. Findings may be associated with anasarca. 6. New displaced bilateral rib fractures. Electronically Signed   By: Richarda Overlie M.D.   On: 11/30/2023 10:26     Family Communication: Discussed with husband at bedside, he understand and agree. All questions answereed.  Disposition: Status is: Inpatient Remains inpatient appropriate because: rate control, pain control, PT follow up, tube feeds  Planned Discharge Destination: Skilled nursing facility     Time spent: 40 minutes  Author: Marcelino Duster, MD 12/01/2023 1:43 PM Secure chat 7am to 7pm For on call review [email or web address].

## 2023-12-01 NOTE — Telephone Encounter (Signed)
Pharmacy Patient Advocate Encounter  Insurance verification completed.    The patient is insured through [employer]. Patient has Medicare and is not eligible for a copay card, but may be able to apply for patient assistance, if available.    Ran test claim for ELIQUIS and the current 30 day co-pay is $11.20.   This test claim was processed through [facility]- copay amounts may vary at other pharmacies due to pharmacy/plan contracts, or as the patient moves through the different stages of their insurance plan.

## 2023-12-01 NOTE — Progress Notes (Addendum)
[facility]  ROUNDING NOTE   Subjective:   Tonya MANIFOLD is a 72  y.o. female with past medical conditions including chronic kidney disease stage IV, proteinuria, hypertension, anemia of chronic kidney disease, RA, OSA and secondary hyperparathyroidism. Patient presented to the hospital for planned right shoulder arthroplasty which was later complicated by PEA cardiac arrest.  Patient is known to our practice and is followed outpatient by Dr. Cherylann Ratel.  Patient is seen and evaluated at bedside in ICU.    Patient seen resting in bed No family present Denies pain or discomfort  G-tube in place, Osmolite tube feeds at 25 mL/h  Creatinine 3.  8 8  Objective:  Vital signs in last 24 hours:  Temp:  [97.6 F (36.4 C)-98.4 F (36.9 C)] 98.4 F (36.9 C) (12/19 1236) Pulse Rate:  [58-64] 60 (12/19 0322) Resp:  [15-34] 18 (12/19 1236) BP: (70-131)/(45-94) 98/45 (12/19 1236) SpO2:  [96 %-100 %] 99 % (12/19 1236) Weight:  [77.9 kg] 77.9 kg (12/19 0500)  Weight change: 1.9 kg Filed Weights   11/29/23 0449 11/30/23 0500 12/01/23 0500  Weight: 75.9 kg 76 kg 77.9 kg    Intake/Output: I/O last 3 completed shifts: In: 2794.1 [I.V.:2405.8; Other:100; IV Piggyback:288.3] Out: 1100 [Urine:1050; Drains:50]   Intake/Output this shift:  No intake/output data recorded.  Physical Exam: General: Ill appearing, laying in bed  Head: Normocephalic, atraumatic. Moist oral mucosal membranes  Eyes: Anicteric  Lungs:  Coarse breath sounds,  O2  Heart: irregular, atrial fibrillation/flutter  Abdomen:  Soft, nontender  Extremities:  No peripheral edema.  Neurologic: Resting quietly.  Skin: No lesions       Basic Metabolic Panel: Recent Labs  Lab 11/26/23 0434 11/27/23 0322 11/28/23 0430 11/29/23 0429 11/30/23 0727 11/30/23 1752 12/01/23 0629  NA 143 144 145 149* 148*  --  144  K 4.6 4.3 3.9 4.3 4.6  --  4.8  CL 107 106 107 111 112*  --  109  CO2 22 24 25 23  21*  --   18*  GLUCOSE 92 119* 111* 110* 102*  --  100*  BUN 83* 83* 78* 81* 85*  --  98*  CREATININE 2.58* 2.52* 2.52* 2.80* 3.03*  --  3.88*  CALCIUM 9.7 9.2 9.7 9.6 9.2  --  8.8*  MG 2.4  --  2.5*  --  2.5* 2.4 2.4  PHOS 4.8* 5.0* 4.7* 5.0* 5.8* 6.3* 6.9*    Liver Function Tests: Recent Labs  Lab 11/26/23 0434 11/27/23 0322 11/28/23 0430 11/29/23 0429 11/30/23 0727  ALBUMIN 2.4* 2.3* 2.7* 2.6* 2.5*   No results for input(s): "LIPASE", "AMYLASE" in the last 168 hours. No results for input(s): "AMMONIA" in the last 168 hours.  CBC: Recent Labs  Lab 11/27/23 0322 11/28/23 0430 11/29/23 0429 11/30/23 0727 12/01/23 0629  WBC 12.6* 14.1* 15.5* 17.6* 36.6*  HGB 7.9* 8.3* 7.8* 7.9* 7.5*  HCT 26.4* 26.8* 25.1* 26.1* 24.5*  MCV 92.6 89.9 91.6 91.3 93.9  PLT 374 413* 419* 418* 331    Cardiac Enzymes: Recent Labs  Lab 11/30/23 0727  CKTOTAL 96    BNP: Invalid input(s): "POCBNP"  CBG: Recent Labs  Lab 11/30/23 1933 12/01/23 0032 12/01/23 0320 12/01/23 0809 12/01/23 1401  GLUCAP 101* 93 86 87 81    Microbiology: Results for orders placed or performed during the hospital encounter of 11/18/23  Surgical pcr screen     Status: Abnormal   Collection Time: 11/18/23 12:11 PM   Specimen: Nasal Mucosa;  Nasal Swab  Result Value Ref Range Status   MRSA, PCR NEGATIVE NEGATIVE Final   Staphylococcus aureus POSITIVE (A) NEGATIVE Final    Comment: (NOTE) The Xpert SA Assay (FDA approved for NASAL specimens in patients 17 years of age and older), is one component of a comprehensive surveillance program. It is not intended to diagnose infection nor to guide or monitor treatment. Performed at [facility], [address], [place], [place] [address]   Aerobic/Anaerobic Culture w Gram Stain (surgical/deep wound)     Status: Abnormal (Preliminary result)   Collection Time: 11/18/23  2:29 PM   Specimen: Path Tissue  Result Value Ref Range Status   Specimen Description    Final    TISSUE Performed at [facility], [address] [place], [place] [address]    Special Requests RT HUMERUS HOLD 21DAYS  Final   Gram Stain   Final    RARE WBC PRESENT,BOTH PMN AND MONONUCLEAR NO ORGANISMS SEEN    Culture (A)  Final    CANDIDA ALBICANS CONTINUING TO HOLD Performed at [facility], [address], [place], [place] [address]    Report Status PENDING  Incomplete  Aerobic/Anaerobic Culture w Gram Stain (surgical/deep wound)     Status: None (Preliminary result)   Collection Time: 11/18/23  2:43 PM   Specimen: Path Tissue  Result Value Ref Range Status   Specimen Description   Final    TISSUE Performed at [facility], [address] [place], [place] [address]    Special Requests TUBEROSITY,HOLD 21DAYS  Final   Gram Stain   Final    FEW WBC PRESENT,BOTH PMN AND MONONUCLEAR NO ORGANISMS SEEN    Culture   Final    NO GROWTH 13 DAYS CONTINUING TO HOLD Performed at [facility], [address], [place], [place] [address]    Report Status PENDING  Incomplete  Aerobic/Anaerobic Culture w Gram Stain (surgical/deep wound)     Status: Abnormal (Preliminary result)   Collection Time: 11/18/23  2:52 PM   Specimen: Path Tissue  Result Value Ref Range Status   Specimen Description   Final    TISSUE Performed at [facility], [address] [place], [place] [address]    Special Requests RT HUMERAL TRAY HOLD 21 DAYS  Final   Gram Stain   Final    RARE WBC PRESENT, PREDOMINANTLY MONONUCLEAR NO ORGANISMS SEEN    Culture (A)  Final    CANDIDA ALBICANS CONTINUING TO HOLD Performed at [facility], [address] [place], [place] [address]    Report Status PENDING  Incomplete  Aerobic/Anaerobic Culture w Gram Stain (surgical/deep wound)     Status: None (Preliminary result)   Collection Time: 11/18/23  2:58 PM   Specimen: Path Tissue  Result Value Ref Range Status   Specimen Description   Final     TISSUE Performed at [facility], [address] [place], [place] [address]    Special Requests GLENOSPHERE,HOLD 21 DAYS  Final   Gram Stain   Final    RARE WBC PRESENT,BOTH PMN AND MONONUCLEAR NO ORGANISMS SEEN    Culture   Final    NO GROWTH 13 DAYS CONTINUING TO HOLD Performed at [facility], [address], [place], [place] [address]    Report Status PENDING  Incomplete  Aerobic/Anaerobic Culture w Gram Stain (surgical/deep wound)     Status: None (Preliminary result)   Collection Time: 11/18/23  3:06 PM   Specimen: Path Tissue  Result Value Ref Range Status   Specimen Description   Final    TISSUE Performed at [facility], [address] [place], [place] [address]    Special Requests RT HUMEREUS CANAL HOLD 21DAYS  Final   Gram Stain   Final    FEW WBC PRESENT, PREDOMINANTLY MONONUCLEAR NO ORGANISMS SEEN    Culture   Final    NO GROWTH 13 DAYS CONTINUING TO HOLD Performed at [facility], [address], [place], [place] [address]    Report Status PENDING  Incomplete  Culture, blood (Routine X 2) w Reflex to ID Panel     Status: None   Collection Time: 11/18/23  7:22 PM   Specimen: BLOOD  Result Value Ref Range Status   Specimen Description BLOOD BLOOD LEFT HAND  Final   Special Requests   Final    BOTTLES DRAWN AEROBIC AND ANAEROBIC Blood Culture adequate volume   Culture   Final    NO GROWTH 5 DAYS Performed at [facility], [address], [place], [place] [address]    Report Status 11/23/2023 FINAL  Final  Culture, blood (Routine X 2) w Reflex to ID Panel     Status: None   Collection Time: 11/18/23  7:28 PM   Specimen: BLOOD  Result Value Ref Range Status   Specimen Description BLOOD BLOOD LEFT ARM  Final   Special Requests   Final    BOTTLES DRAWN AEROBIC AND ANAEROBIC Blood Culture adequate volume   Culture   Final    NO GROWTH 5 DAYS Performed at [facility], [address], [place], [place]  [address]    Report Status 11/23/2023 FINAL  Final  MRSA Next Gen by PCR, Nasal     Status: None   Collection Time: 11/19/23  1:57 PM   Specimen: Nasal Mucosa; Nasal Swab  Result Value Ref Range Status   MRSA by PCR Next Gen NOT DETECTED NOT DETECTED Final    Comment: (NOTE) The GeneXpert MRSA Assay (FDA approved for NASAL specimens only), is one component of a comprehensive MRSA colonization surveillance program. It is not intended to diagnose MRSA infection nor to guide or monitor treatment for MRSA infections. Test performance is not FDA approved in patients less than 28 years old. Performed at [facility], [address] [place], [place] [address]   Culture, Respiratory w Gram Stain     Status: None   Collection Time: 11/21/23  3:18 PM   Specimen: Tracheal Aspirate; Respiratory  Result Value Ref Range Status   Specimen Description   Final    TRACHEAL ASPIRATE Performed at [facility], [address], [place], [place] [address]    Special Requests   Final    NONE Performed at [facility], [address] [place], [place] [address]    Gram Stain   Final    ABUNDANT WBC PRESENT, PREDOMINANTLY PMN RARE GRAM POSITIVE COCCI IN PAIRS RARE GRAM NEGATIVE RODS    Culture   Final    FEW Normal respiratory flora-no Staph aureus or Pseudomonas seen Performed at [facility], [address] [place], [place] [address]    Report Status 11/23/2023 FINAL  Final    Coagulation Studies: No results for input(s): "LABPROT", "INR" in the last 72 hours.   Urinalysis: No results for input(s): "COLORURINE", "LABSPEC", "PHURINE", "GLUCOSEU", "HGBUR", "BILIRUBINUR", "KETONESUR", "PROTEINUR", "UROBILINOGEN", "NITRITE", "LEUKOCYTESUR" in the last 72  hours.  Invalid input(s): "APPERANCEUR"    Imaging: IR GASTROSTOMY TUBE MOD SED Result Date: 11/30/2023 INDICATION: 72 year old female with dysphagia. She presents for percutaneous gastrostomy tube  placement. EXAM: Fluoroscopically guided placement of percutaneous pull-through gastrostomy tube Interventional Radiologist:  Sterling Big, MD MEDICATIONS: 2 g Ancef; Antibiotics were administered within 1 hour of the procedure. ANESTHESIA/SEDATION: Versed 1 mg IV; Fentanyl 50 mcg IV administered by the radiology nurse Moderate Sedation Time:  18 minutes The patient's vital signs and level of consciousness were continuously monitored during the procedure by the interventional radiology nurse under my direct supervision. CONTRAST:  10mL OMNIPAQUE IOHEXOL 300 MG/ML  SOLN FLUOROSCOPY: Radiation exposure index: 51 mGy reference air kerma COMPLICATIONS: None immediate. PROCEDURE: Informed written consent was obtained from the patient after a thorough discussion of the procedural risks, benefits and alternatives. All questions were addressed. Maximal Sterile Barrier Technique was utilized including caps, mask, sterile gowns, sterile gloves, sterile drape, hand hygiene and skin antiseptic. A timeout was performed prior to the initiation of the procedure. Maximal barrier sterile technique utilized including caps, mask, sterile gowns, sterile gloves, large sterile drape, hand hygiene, and chlorhexadine skin prep. An angled catheter was advanced over a wire under fluoroscopic guidance through the nose, down the esophagus and into the body of the stomach. The stomach was then insufflated with several 100 ml of air. Fluoroscopy confirmed location of the gastric bubble, as well as inferior displacement of the barium stained colon. Under direct fluoroscopic guidance, a single T-tack was placed, and the anterior gastric wall drawn up against the anterior abdominal wall. Percutaneous access was then obtained into the mid gastric body with an 18 gauge sheath needle. Aspiration of air, and injection of contrast material under fluoroscopy confirmed needle placement. An Amplatz wire was advanced in the gastric body and the  access needle exchanged for a 9-French vascular sheath. A snare device was advanced through the vascular sheath and an Amplatz wire advanced through the angled catheter. The Amplatz wire was successfully snared and this was pulled up through the esophagus and out the mouth. A 20-French Burnell Blanks MIC-PEG tube was then connected to the snare and pulled through the mouth, down the esophagus, into the stomach and out to the anterior abdominal wall. Hand injection of contrast material confirmed intragastric location. The T-tack retention suture was then cut. The pull through peg tube was then secured with the external bumper and capped. The patient will be observed for several hours with the newly placed tube on low wall suction to evaluate for any post procedure complication. The patient tolerated the procedure well, there is no immediate complication. IMPRESSION: Successful placement of a 20 French pull through gastrostomy tube. Electronically Signed   By: Malachy Moan M.D.   On: 11/30/2023 15:38   CT ABDOMEN PELVIS WO CONTRAST Result Date: 11/30/2023 CLINICAL DATA:  Dysphagia. Evaluate anatomy for gastrostomy tube placement. EXAM: CT ABDOMEN AND PELVIS WITHOUT CONTRAST TECHNIQUE: Multidetector CT imaging of the abdomen and pelvis was performed following the standard protocol without IV contrast. RADIATION DOSE REDUCTION: This exam was performed according to the departmental dose-optimization program which includes automated exposure control, adjustment of the mA and/or kV according to patient size and/or use of iterative reconstruction technique. COMPARISON:  05/28/2023 and chest CT 07/15/2023 FINDINGS: Lower chest: Bilateral pleural effusions, right side larger than left. Patchy densities in the right lower lung. Infection cannot be excluded. Hepatobiliary: Gallbladder appears to be surgically absent. No focal liver abnormality. Common bile duct is prominent  measuring 1.1 cm and measured approximately  0.7 cm on 05/28/2023. Pancreas: Unremarkable. No pancreatic ductal dilatation or surrounding inflammatory changes. Spleen: Normal in size without focal abnormality. Adrenals/Urinary Tract: Normal adrenal glands. Normal appearance of the left kidney without stones or hydronephrosis. Again noted is a central right renal mass there is poorly defined on this study without intravascular contrast. Mass roughly measures 5.5 x 4.4 cm. Again noted is a slightly hyperdense exophytic structure in the right kidney lower pole. Foley catheter in the urinary bladder. Stomach/Bowel: Sigmoid colon extends into the upper abdomen and anterior to the distal stomach. Oral contrast within the colon. Moderate sized hiatal hernia with the stomach body posterior to the sigmoid colon and there are small bowel loops along the left side of the distal gastric body. No evidence for bowel dilatation or obstruction. No focal bowel inflammation. Vascular/Lymphatic: Aortic atherosclerosis. No enlarged abdominal or pelvic lymph nodes. Reproductive: Uterus and bilateral adnexa are unremarkable. Other: Moderate sized hiatal hernia. Spleen may be slightly pulled into the hiatal hernia as well and this is similar to the previous CT examination. No evidence for ascites. Presacral edema. Diffuse subcutaneous edema. Musculoskeletal: Postsurgical changes in the pelvis with surgical plate and screw fixation of the pubic symphysis. Again noted is a surgical screw extending through bilateral SI joints and surgical screw in the left ilium. Markedly displaced fractures of the right fifth, sixth and seventh ribs. Also evidence for acute fractures involving the right eighth and ninth ribs. Patient has evidence of old right rib fractures. New left fractures including a displaced lateral left fifth rib fracture. Old vertebral body compression fractures at L1, T11 and T6. IMPRESSION: 1. Moderate sized hiatal hernia with colon anterior to the distal stomach. 2.  Bilateral pleural effusions, right side greater than left. Patchy densities at the right lung base. Findings could be associated with atelectasis or infection. 3. Interval enlargement of common bile duct measuring up to 1.1 cm. Recommend correlation with liver function tests. 4. Right renal mass. This has been present on previous imaging examinations. 5. Subcutaneous edema and presacral edema. Findings may be associated with anasarca. 6. New displaced bilateral rib fractures. Electronically Signed   By: Richarda Overlie M.D.   On: 11/30/2023 10:26      Medications:    sodium chloride 50 mL (11/30/23 1742)   cefTRIAXone (ROCEPHIN)  IV 2 g (11/30/23 1855)   DAPTOmycin 700 mg (11/30/23 1743)   dextrose 50 mL/hr at 12/01/23 0441   feeding supplement (OSMOLITE 1.2 CAL) 1,000 mL (12/01/23 1019)   micafungin (MYCAMINE) 100 mg in sodium chloride 0.9 % 100 mL IVPB 100 mg (12/01/23 1022)    amiodarone  200 mg Per Tube BID   apixaban  5 mg Per Tube BID   Chlorhexidine Gluconate Cloth  6 each Topical Daily   free water  100 mL Per Tube Q4H   lidocaine  1 patch Transdermal Q24H   metoprolol tartrate  12.5 mg Oral BID   multivitamin with minerals  1 tablet Per Tube Daily   mupirocin ointment   Topical BID   [START ON 12/02/2023] pramipexole  0.125 mg Per Tube q morning   And   pramipexole  0.25 mg Per Tube QHS   sodium chloride flush  3 mL Intravenous Q12H   sodium chloride flush  3 mL Intravenous Q12H   thiamine  100 mg Per Tube Daily   sodium chloride, bisacodyl, ipratropium-albuterol, mouth rinse, mouth rinse, polyethylene glycol, senna-docusate  Assessment/ Plan:  Ms. Kendyll  R Cummings is a 72 y.o.  female with past medical conditions including chronic kidney disease stage IV, proteinuria, hypertension, anemia of chronic kidney disease, RA, OSA and secondary hyperparathyroidism. Patient presented to the hospital for planned right shoulder arthroplasty which was later complicated by PEA cardiac  arrest.  She is currently admitted for Periprosthetic fracture around internal prosthetic right shoulder joint [M97.31XA] Septic arthritis (HCC) [M00.9]   Acute Kidney Injury on chronic kidney disease stage 4 with baseline creatinine 2.08 on 10/04/2023.  Acute kidney injury secondary to ATN from hypoperfusion.  Chronic kidney disease secondary to hypertension and rheumatoid arthritis with prior NSAID use.  No IV contrast exposure.    -Renal function continues to decline.  BUN currently 98.  No uremic symptoms noted.  G-tube placed yesterday with eternal feedings started today.  Will continue to monitor renal function and assess response to tube feeds.  No acute indication for dialysis.  If renal function does not improve in 1 to 2 days, may have to consider renal replacement therapy.   Lab Results  Component Value Date   CREATININE 3.88 (H) 12/01/2023   CREATININE 3.03 (H) 11/30/2023   CREATININE 2.80 (H) 11/29/2023    Intake/Output Summary (Last 24 hours) at 12/01/2023 1602 Last data filed at 12/01/2023 0500 Gross per 24 hour  Intake 1184.28 ml  Output 350 ml  Net 834.28 ml   2. Anemia of chronic kidney disease with acute blood loss Lab Results  Component Value Date   HGB 7.5 (L) 12/01/2023  Patient has received blood transfusions during this admission.  Hemoglobin below acceptable range.  Will defer need of additional blood transfusions to primary team.  Can consider ESA.   3. Secondary Hyperparathyroidism: with outpatient labs: PTH 45, phosphorus 4.7, calcium 9.4 on 11/07/23.   Lab Results  Component Value Date   CALCIUM 8.8 (L) 12/01/2023   PHOS 6.9 (H) 12/01/2023    Phosphorus appears elevated.  Will consider binders if needed.  Continue calcitriol.  4.  Right reverse shoulder arthroplasty loosening after periprosthetic fracture with a right periprosthetic infection and humerus fractures.  Patient underwent right reverse shoulder arthroplasty explant of glenoid and humeral  components and open treatment of right humerus fracture with intramedullary device.  This was done on 11/18/2023. Currently receiving IV ceftriaxone and IV daptomycin.     LOS: 13 Afsa Meany 12/19/20244:02 PM

## 2023-12-01 NOTE — Progress Notes (Signed)
Rounding Note    Patient Name: Tonya Cummings Date of Encounter: 12/01/2023  [facility] Cardiologist: Julien Nordmann, MD   Subjective   Patient is alert on exam although little response to questions. Converted to sinus rhythm, rate 60s, sometime yesterday afternoon while off telemetry for PEG placement.  Inpatient Medications    Scheduled Meds:  amiodarone  200 mg Per Tube BID   [START ON 12/02/2023] aspirin  325 mg Per Tube Daily   Chlorhexidine Gluconate Cloth  6 each Topical Daily   free water  100 mL Per Tube Q4H   lidocaine  1 patch Transdermal Q24H   metoprolol tartrate  25 mg Per Tube BID   multivitamin with minerals  1 tablet Per Tube Daily   mupirocin ointment   Topical BID   pramipexole  0.125 mg Oral q morning   And   pramipexole  0.25 mg Oral QHS   sodium chloride flush  3 mL Intravenous Q12H   sodium chloride flush  3 mL Intravenous Q12H   thiamine  100 mg Per Tube Daily   Continuous Infusions:  sodium chloride 50 mL (11/30/23 1742)    ceFAZolin (ANCEF) IV     cefTRIAXone (ROCEPHIN)  IV 2 g (11/30/23 1855)   DAPTOmycin 700 mg (11/30/23 1743)   dextrose 50 mL/hr at 12/01/23 0441   feeding supplement (OSMOLITE 1.2 CAL) 1,000 mL (12/01/23 1019)   heparin 1,900 Units/hr (12/01/23 0844)   micafungin (MYCAMINE) 100 mg in sodium chloride 0.9 % 100 mL IVPB 100 mg (12/01/23 1022)   PRN Meds: sodium chloride, bisacodyl, ipratropium-albuterol, metoprolol tartrate, morphine injection, mouth rinse, mouth rinse, polyethylene glycol, senna-docusate   Vital Signs    Vitals:   12/01/23 0000 12/01/23 0322 12/01/23 0500 12/01/23 0816  BP: (!) 70/55 (!) 131/94  (!) 107/46  Pulse: 62 60    Resp: (!) 26 (!) 24  15  Temp:  97.7 F (36.5 C)  98.3 F (36.8 C)  TempSrc:  Axillary  Oral  SpO2: 97% 100%  100%  Weight:   77.9 kg   Height:        Intake/Output Summary (Last 24 hours) at 12/01/2023 1034 Last data filed at 12/01/2023 0500 Gross per 24 hour   Intake 1643.96 ml  Output 350 ml  Net 1293.96 ml      12/01/2023    5:00 AM 11/30/2023    5:00 AM 11/29/2023    4:49 AM  Last 3 Weights  Weight (lbs) 171 lb 11.8 oz 167 lb 8.8 oz 167 lb 5.3 oz  Weight (kg) 77.9 kg 76 kg 75.9 kg      Telemetry    Sinus rhythm rate 60s, converted from atrial flutter sometime yesterday afternoon while off telemetry for PEG placement - Personally Reviewed  Physical Exam   GEN: No acute distress. Minimally communicative. Neck: No JVD Cardiac: RRR, no murmurs, rubs, or gallops.  Respiratory: Clear to auscultation bilaterally. GI: Soft, nontender, non-distended  MS: No edema; No deformity. Neuro:  Nonfocal  Psych: Normal affect   Labs    High Sensitivity Troponin:   Recent Labs  Lab 11/19/23 1310 11/19/23 1455 11/19/23 2039 11/20/23 0252 11/20/23 2207  TROPONINIHS 208* 392* 749* 804* 293*     Chemistry Recent Labs  Lab 11/28/23 0430 11/29/23 0429 11/30/23 0727 11/30/23 1752 12/01/23 0629  NA 145 149* 148*  --  144  K 3.9 4.3 4.6  --  4.8  CL 107 111 112*  --  109  CO2 25 23 21*  --  18*  GLUCOSE 111* 110* 102*  --  100*  BUN 78* 81* 85*  --  98*  CREATININE 2.52* 2.80* 3.03*  --  3.88*  CALCIUM 9.7 9.6 9.2  --  8.8*  MG 2.5*  --  2.5* 2.4 2.4  ALBUMIN 2.7* 2.6* 2.5*  --   --   GFRNONAA 20* 18* 16*  --  12*  ANIONGAP 13 15 15   --  17*    Lipids No results for input(s): "CHOL", "TRIG", "HDL", "LABVLDL", "LDLCALC", "CHOLHDL" in the last 168 hours.  Hematology Recent Labs  Lab 11/29/23 0429 11/30/23 0727 12/01/23 0629  WBC 15.5* 17.6* 36.6*  RBC 2.74* 2.86* 2.61*  HGB 7.8* 7.9* 7.5*  HCT 25.1* 26.1* 24.5*  MCV 91.6 91.3 93.9  MCH 28.5 27.6 28.7  MCHC 31.1 30.3 30.6  RDW 17.4* 17.9* 18.3*  PLT 419* 418* 331   Thyroid No results for input(s): "TSH", "FREET4" in the last 168 hours.  BNPNo results for input(s): "BNP", "PROBNP" in the last 168 hours.  DDimer No results for input(s): "DDIMER" in the last 168 hours.    Radiology    IR GASTROSTOMY TUBE MOD SED Result Date: 11/30/2023 INDICATION: 72 year old female with dysphagia. She presents for percutaneous gastrostomy tube placement. EXAM: Fluoroscopically guided placement of percutaneous pull-through gastrostomy tube Interventional Radiologist:  Sterling Big, MD MEDICATIONS: 2 g Ancef; Antibiotics were administered within 1 hour of the procedure. ANESTHESIA/SEDATION: Versed 1 mg IV; Fentanyl 50 mcg IV administered by the radiology nurse Moderate Sedation Time:  18 minutes The patient's vital signs and level of consciousness were continuously monitored during the procedure by the interventional radiology nurse under my direct supervision. CONTRAST:  10mL OMNIPAQUE IOHEXOL 300 MG/ML  SOLN FLUOROSCOPY: Radiation exposure index: 51 mGy reference air kerma COMPLICATIONS: None immediate. PROCEDURE: Informed written consent was obtained from the patient after a thorough discussion of the procedural risks, benefits and alternatives. All questions were addressed. Maximal Sterile Barrier Technique was utilized including caps, mask, sterile gowns, sterile gloves, sterile drape, hand hygiene and skin antiseptic. A timeout was performed prior to the initiation of the procedure. Maximal barrier sterile technique utilized including caps, mask, sterile gowns, sterile gloves, large sterile drape, hand hygiene, and chlorhexadine skin prep. An angled catheter was advanced over a wire under fluoroscopic guidance through the nose, down the esophagus and into the body of the stomach. The stomach was then insufflated with several 100 ml of air. Fluoroscopy confirmed location of the gastric bubble, as well as inferior displacement of the barium stained colon. Under direct fluoroscopic guidance, a single T-tack was placed, and the anterior gastric wall drawn up against the anterior abdominal wall. Percutaneous access was then obtained into the mid gastric body with an 18 gauge sheath  needle. Aspiration of air, and injection of contrast material under fluoroscopy confirmed needle placement. An Amplatz wire was advanced in the gastric body and the access needle exchanged for a 9-French vascular sheath. A snare device was advanced through the vascular sheath and an Amplatz wire advanced through the angled catheter. The Amplatz wire was successfully snared and this was pulled up through the esophagus and out the mouth. A 20-French Burnell Blanks MIC-PEG tube was then connected to the snare and pulled through the mouth, down the esophagus, into the stomach and out to the anterior abdominal wall. Hand injection of contrast material confirmed intragastric location. The T-tack retention suture was then cut. The pull through peg tube  was then secured with the external bumper and capped. The patient will be observed for several hours with the newly placed tube on low wall suction to evaluate for any post procedure complication. The patient tolerated the procedure well, there is no immediate complication. IMPRESSION: Successful placement of a 20 French pull through gastrostomy tube. Electronically Signed   By: Malachy Moan M.D.   On: 11/30/2023 15:38   CT ABDOMEN PELVIS WO CONTRAST Result Date: 11/30/2023 CLINICAL DATA:  Dysphagia. Evaluate anatomy for gastrostomy tube placement. EXAM: CT ABDOMEN AND PELVIS WITHOUT CONTRAST TECHNIQUE: Multidetector CT imaging of the abdomen and pelvis was performed following the standard protocol without IV contrast. RADIATION DOSE REDUCTION: This exam was performed according to the departmental dose-optimization program which includes automated exposure control, adjustment of the mA and/or kV according to patient size and/or use of iterative reconstruction technique. COMPARISON:  05/28/2023 and chest CT 07/15/2023 FINDINGS: Lower chest: Bilateral pleural effusions, right side larger than left. Patchy densities in the right lower lung. Infection cannot be  excluded. Hepatobiliary: Gallbladder appears to be surgically absent. No focal liver abnormality. Common bile duct is prominent measuring 1.1 cm and measured approximately 0.7 cm on 05/28/2023. Pancreas: Unremarkable. No pancreatic ductal dilatation or surrounding inflammatory changes. Spleen: Normal in size without focal abnormality. Adrenals/Urinary Tract: Normal adrenal glands. Normal appearance of the left kidney without stones or hydronephrosis. Again noted is a central right renal mass there is poorly defined on this study without intravascular contrast. Mass roughly measures 5.5 x 4.4 cm. Again noted is a slightly hyperdense exophytic structure in the right kidney lower pole. Foley catheter in the urinary bladder. Stomach/Bowel: Sigmoid colon extends into the upper abdomen and anterior to the distal stomach. Oral contrast within the colon. Moderate sized hiatal hernia with the stomach body posterior to the sigmoid colon and there are small bowel loops along the left side of the distal gastric body. No evidence for bowel dilatation or obstruction. No focal bowel inflammation. Vascular/Lymphatic: Aortic atherosclerosis. No enlarged abdominal or pelvic lymph nodes. Reproductive: Uterus and bilateral adnexa are unremarkable. Other: Moderate sized hiatal hernia. Spleen may be slightly pulled into the hiatal hernia as well and this is similar to the previous CT examination. No evidence for ascites. Presacral edema. Diffuse subcutaneous edema. Musculoskeletal: Postsurgical changes in the pelvis with surgical plate and screw fixation of the pubic symphysis. Again noted is a surgical screw extending through bilateral SI joints and surgical screw in the left ilium. Markedly displaced fractures of the right fifth, sixth and seventh ribs. Also evidence for acute fractures involving the right eighth and ninth ribs. Patient has evidence of old right rib fractures. New left fractures including a displaced lateral left fifth  rib fracture. Old vertebral body compression fractures at L1, T11 and T6. IMPRESSION: 1. Moderate sized hiatal hernia with colon anterior to the distal stomach. 2. Bilateral pleural effusions, right side greater than left. Patchy densities at the right lung base. Findings could be associated with atelectasis or infection. 3. Interval enlargement of common bile duct measuring up to 1.1 cm. Recommend correlation with liver function tests. 4. Right renal mass. This has been present on previous imaging examinations. 5. Subcutaneous edema and presacral edema. Findings may be associated with anasarca. 6. New displaced bilateral rib fractures. Electronically Signed   By: Richarda Overlie M.D.   On: 11/30/2023 10:26    Cardiac Studies   11/20/2023 Echocardiogram 1. Left ventricular ejection fraction, by estimation, is 55 to 60%. The  left ventricle  has normal function. The left ventricle has no regional  wall motion abnormalities. There is mild concentric left ventricular  hypertrophy. Left ventricular diastolic  parameters are indeterminate.   2. Right ventricular systolic function is normal. The right ventricular  size is normal.   3. Left atrial size was moderately dilated.   4. The mitral valve is grossly normal. Trivial mitral valve  regurgitation. No evidence of mitral stenosis.   5. The aortic valve is grossly normal. There is mild calcification of the  aortic valve. There is mild thickening of the aortic valve. Aortic valve  regurgitation is not visualized. Aortic valve sclerosis/calcification is  present, without any evidence of  aortic stenosis.  Patient Profile     72 y.o. female with a h/o HFpEF, CKD stage 4, HTN who presented for shoulder surgery, post-op course complicated by respiratory failure and PEA arrest requiring CPR with ROSC, is being seen for continued evaluation of new onset Afib/flutter with RVR.   Assessment & Plan    Atrial fibrillation/flutter with RVR - New s/p post-op PEA  arrest requiring CPR - Converted to sinus rhythm, rate 60s, sometime yesterday afternoon while off telemetry for PEG placement - Patient had PEG placement yesterday and was able to restart oral medications - Continue metoprolol tartrate 25 mg BID per tube - Continue amiodarone 200 mg BID per tube - Transition from IV heparin to Eliquis 5 mg per tube  PEA arrest NSTEMI - Post-op PEA arrest requiring CPR for 5-6 minutes with achievement of ROSC - Patient was intubated and transferred to the ICU requiring pressors. She was extubated 12/13. - off pressors - HS trop 749>804 - Echo showed LVEF 55-60% - No plan for cath this admission due to mental status and CKD, consider outpatient ischemic evaluation  HTN - BP has improved significantly since restarting metoprolol tartrate per tube - Continue to monitor  AT on CKD IV - Cr 3.03>>3.88, baseline ~2 - Management per IM  Altered mental status - Significantly declined from baseline, unclear if related to PEA arrest - Consider repeat CT head   For questions or updates, please contact [facility] Please consult [email or web address] for contact info under        Signed, Orion Crook, PA-C  12/01/2023, 10:34 AM

## 2023-12-01 NOTE — Progress Notes (Signed)
Physical Therapy Treatment Patient Details Name: Tonya Cummings MRN: [id number] DOB: 05-16-1951 Today's Date: 12/01/2023   History of Present Illness Pt is a 72 y.o. female s/p 11/18/23 R reverse shoulder arthroplasty explant of glenoid and humeral components, open treatment of R humerus fx with intramedullary device, and R humerus insertion of antibiotic deliver device.  11/19/23 pt with PEA arrest (intubated and admitted to ICU); pt with R pneumothorax s/p chest tube placement 12/7; s/p HD catheter placement 12/7 d/t worsening kidney function.  Extubated 11/24/23.  PMH includes SOB, sleep apnea, htn, CAD, CHF, hiatal hernia, CKD stage 4, HFpEF, B12 deficiency, anemia, RA, Raynaud's phenomenon, OSA, L hip sx, R TKA, R reverse shoulder arthroplasty (s/p revision).    PT Comments  Patient received in bed, husband at bedside. Husband has a lot of questions regarding patient status. Patient continues to have tele sitter, mitts donned, unable to participate in PT in current state. Patient requires +2 assist for re-positioning in bed and then returns self to same position she was in. Pt to sign off at this time due to inability to tolerate or participate. Please re-consult if patient improves.     If plan is discharge home, recommend the following: Two people to help with walking and/or transfers;Two people to help with bathing/dressing/bathroom;Assist for transportation;Supervision due to cognitive status   Can travel by private vehicle     No  Equipment Recommendations  None recommended by PT    Recommendations for Other Services       Precautions / Restrictions Precautions Precautions: Shoulder Type of Shoulder Precautions: NWB; shoulder immobilizer on except for ADLs/exercises Shoulder Interventions: Shoulder sling/immobilizer;At all times;Off for dressing/bathing/exercises Precaution Comments: L IJ temporary HD catheter; sling on OOB ( don't see sling) Restrictions Weight Bearing  Restrictions Per Provider Order: Yes RUE Weight Bearing Per Provider Order: Non weight bearing     Mobility  Bed Mobility Overal bed mobility: Needs Assistance             General bed mobility comments: Max A +1-2 ( husband assist) to reposition in bed, then returns self back to position she was in originally    Transfers                   General transfer comment: unsafe to attempt    Ambulation/Gait                   Stairs             Wheelchair Mobility     Tilt Bed    Modified Rankin (Stroke Patients Only)       Balance                                            Cognition Arousal: Lethargic Behavior During Therapy: Restless Overall Cognitive Status: Impaired/Different from baseline Area of Impairment: Problem solving, Following commands, Safety/judgement, Awareness                       Following Commands: Follows one step commands inconsistently   Awareness: Intellectual Problem Solving: Requires verbal cues, Requires tactile cues General Comments: able to tell me her name and that she is in hospital. No real participation with re-positioning in bed. Very restless.        Exercises      General Comments  Pertinent Vitals/Pain Pain Assessment Pain Assessment: PAINAD Breathing: normal Negative Vocalization: occasional moan/groan, low speech, negative/disapproving quality Facial Expression: facial grimacing Body Language: tense, distressed pacing, fidgeting Consolability: no need to console PAINAD Score: 4 Pain Location: grimaces with re-positioning in bed Pain Descriptors / Indicators: Discomfort, Grimacing Pain Intervention(s): Repositioned    Home Living                          Prior Function            PT Goals (current goals can now be found in the care plan section) Acute Rehab PT Goals Patient Stated Goal: to improve functional mobility as able medically PT  Goal Formulation: With family Time For Goal Achievement: 12/09/23 Potential to Achieve Goals: Poor Progress towards PT goals: Not progressing toward goals - comment (patient has been total assist for 6 days, not making any progress due to cognitive status)    Frequency    Min 1X/week      PT Plan      Co-evaluation              AM-PAC PT "6 Clicks" Mobility   Outcome Measure  Help needed turning from your back to your side while in a flat bed without using bedrails?: Total Help needed moving from lying on your back to sitting on the side of a flat bed without using bedrails?: Total Help needed moving to and from a bed to a chair (including a wheelchair)?: Total Help needed standing up from a chair using your arms (e.g., wheelchair or bedside chair)?: Total Help needed to walk in hospital room?: Total Help needed climbing 3-5 steps with a railing? : Total 6 Click Score: 6    End of Session Equipment Utilized During Treatment: Oxygen Activity Tolerance: Patient limited by lethargy Patient left: in bed;with call bell/phone within reach;with bed alarm set;with family/visitor present Nurse Communication: Mobility status PT Visit Diagnosis: Other abnormalities of gait and mobility (R26.89);Adult, failure to thrive (R62.7);Muscle weakness (generalized) (M62.81) Pain - Right/Left: Right Pain - part of body: Shoulder     Time: 1610-9604 PT Time Calculation (min) (ACUTE ONLY): 9 min  Charges:    $Therapeutic Activity: 8-22 mins PT General Charges $$ ACUTE PT VISIT: 1 Visit                     Alan Riles, PT, GCS 12/01/23,10:09 AM

## 2023-12-01 NOTE — Progress Notes (Signed)
Daily Progress Note   Patient Name: Tonya Cummings       Date: 12/01/2023 DOB: Jan 17, 1951  Age: 72 y.o. MRN#: [id number] Attending Physician: Marcelino Duster, MD Primary Care Physician: Myrene Buddy, NP Admit Date: 11/18/2023  Reason for Consultation/Follow-up: Establishing goals of care  Subjective: Notes and labs reviewed.  In to see patient.  She is currently lying in bed with nursing at bedside.  She has mittens in place and TeleSitter.  She has an abdominal binder in place covering new PEG tube.  She opens her eyes intermittently and looks restless and agitated.  She is not able to answer my questions.  No family at bedside today.  Length of Stay: 13  Current Medications: Scheduled Meds:   amiodarone  200 mg Per Tube BID   apixaban  5 mg Per Tube BID   Chlorhexidine Gluconate Cloth  6 each Topical Daily   free water  100 mL Per Tube Q4H   lidocaine  1 patch Transdermal Q24H   metoprolol tartrate  12.5 mg Oral BID   multivitamin with minerals  1 tablet Per Tube Daily   mupirocin ointment   Topical BID   [START ON 12/02/2023] pramipexole  0.125 mg Per Tube q morning   And   pramipexole  0.25 mg Per Tube QHS   sodium chloride flush  3 mL Intravenous Q12H   sodium chloride flush  3 mL Intravenous Q12H   thiamine  100 mg Per Tube Daily    Continuous Infusions:  sodium chloride 50 mL (11/30/23 1742)   cefTRIAXone (ROCEPHIN)  IV 2 g (11/30/23 1855)   DAPTOmycin 700 mg (11/30/23 1743)   dextrose 50 mL/hr at 12/01/23 0441   feeding supplement (OSMOLITE 1.2 CAL) 1,000 mL (12/01/23 1019)   micafungin (MYCAMINE) 100 mg in sodium chloride 0.9 % 100 mL IVPB 100 mg (12/01/23 1022)    PRN Meds: sodium chloride, bisacodyl, ipratropium-albuterol, mouth rinse, mouth  rinse, polyethylene glycol, senna-docusate  Physical Exam Constitutional:      Comments: Opens eyes intermittently.  Appears restless and agitated  Pulmonary:     Effort: Pulmonary effort is normal.  Skin:    General: Skin is warm and dry.             Vital Signs: BP (!) 98/45   Pulse 60  Temp 98.4 F (36.9 C) (Oral)   Resp 18   Ht 5\' 3"  (1.6 m)   Wt 77.9 kg   SpO2 99%   BMI 30.42 kg/m  SpO2: SpO2: 99 % O2 Device: O2 Device: Nasal Cannula O2 Flow Rate: O2 Flow Rate (L/min): 1 L/min  Intake/output summary:  Intake/Output Summary (Last 24 hours) at 12/01/2023 1525 Last data filed at 12/01/2023 0500 Gross per 24 hour  Intake 1184.28 ml  Output 350 ml  Net 834.28 ml   LBM: Last BM Date : 11/29/23 Baseline Weight: Weight: 77.1 kg Most recent weight: Weight: 77.9 kg    Patient Active Problem List   Diagnosis Date Noted   Positive result for methicillin resistant Staphylococcus aureus (MRSA) screening 11/30/2023   Pressure injury of skin 11/30/2023   Atrial flutter (HCC) 11/29/2023   Atrial fibrillation with RVR (HCC) 11/26/2023   Cardiac arrest (HCC) 11/21/2023   Tension pneumothorax 11/19/2023   Septic arthritis (HCC) 11/18/2023   Acute on chronic anemia 11/18/2023   (HFpEF) heart failure with preserved ejection fraction (HCC) 11/18/2023   CKD (chronic kidney disease) stage 4, GFR 15-29 ml/min (HCC) 11/18/2023   ILD (interstitial lung disease) (HCC) 11/18/2023   Periprosthetic fracture around internal prosthetic right shoulder joint 10/03/2023   Renal mass, right 08/31/2023   Leukocytosis 08/31/2023   OSA (obstructive sleep apnea) 08/31/2023   Elevated troponin 08/31/2023   Leg pain, bilateral 08/31/2023   At risk for constipation 08/31/2023   Rotator cuff arthropathy 08/30/2023   Anemia in stage 4 chronic kidney disease (HCC) 01/10/2023   Long-term use of immunosuppressant medication 03/09/2022   Dyspnea 09/29/2021   Acute CHF (congestive heart failure)  (HCC) 09/29/2021   Acute respiratory failure with hypoxia (HCC) 09/29/2021   Total knee replacement status 10/13/2020   CKD (chronic kidney disease) stage 3, GFR 30-59 ml/min (HCC) 10/12/2020   Hyperlipidemia 10/12/2020   Hypertension 10/12/2020   Migraine headache 10/12/2020   Restless legs syndrome (RLS) 10/12/2020   Severe obesity (BMI 35.0-39.9) with comorbidity (HCC) 06/30/2020   Primary osteoarthritis of right knee 03/27/2020   Iron deficiency anemia 09/12/2019   Low vitamin B12 level 09/12/2019   Normocytic anemia 09/07/2019   Right medial knee pain 08/09/2018   Depression with anxiety 03/22/2018   Sleep disorder 03/22/2018   Influenza A 02/17/2018   Community acquired pneumonia 02/17/2018   Chronic radicular pain of lower back 02/06/2018   Numbness and tingling of right leg 02/06/2018   Hip dysplasia 02/23/2016   Osteoporosis, post-menopausal 02/23/2016   Raynaud's disease without gangrene 02/23/2016   Seropositive rheumatoid arthritis (HCC) 02/23/2016   Lumbar facet joint pain 09/18/2014    Palliative Care Assessment & Plan     Recommendations/Plan: PEG tube in place.  Continuing current care. PMT will follow-up again tomorrow.  Code Status:    Code Status Orders  (From admission, onward)           Start     Ordered   11/18/23 2005  Full code  Continuous       Question:  By:  Answer:  Consent: discussion documented in EHR   11/18/23 2004           Code Status History     Date Active Date Inactive Code Status Order ID Comments User Context   11/18/2023 1831 11/18/2023 2004 Full Code [id number]  Verdene Lennert, MD Inpatient   10/03/2023 1703 10/04/2023 1920 Full Code [id number]  Signa Kell, MD Inpatient   08/31/2023 1400 09/02/2023 2010  Full Code [id number]  Lovenia Kim, DO Inpatient   08/30/2023 1257 08/31/2023 1400 Full Code [id number]  Signa Kell, MD Inpatient   09/29/2021 1413 10/02/2021 2034 Full Code [id number]  Lurene Shadow, MD ED   10/13/2020  1640 10/15/2020 1951 Full Code [id number]  Donato Heinz, MD Inpatient   02/17/2018 1650 02/20/2018 1818 Full Code [id number]  Altamese Dilling, MD Inpatient       Prognosis: Poor   Thank you for allowing the Palliative Medicine Team to assist in the care of this patient.   Morton Stall, NP  Please contact Palliative Medicine Team phone at [phone] for questions and concerns.

## 2023-12-01 NOTE — Consult Note (Addendum)
PHARMACY - ANTICOAGULATION CONSULT NOTE  Pharmacy Consult for IV Heparin Indication: atrial fibrillation  Patient Measurements: Height: 5\' 3"  (160 cm) Weight: 77.9 kg (171 lb 11.8 oz) IBW/kg (Calculated) : 52.4 Heparin Dosing Weight: 70 kg  Labs: Recent Labs    11/29/23 0429 11/29/23 1938 11/30/23 0727 12/01/23 0629  HGB 7.8*  --  7.9* 7.5*  HCT 25.1*  --  26.1* 24.5*  PLT 419*  --  418* 331  HEPARINUNFRC 0.19* 0.57 0.34 0.15*  CREATININE 2.80*  --  3.03* 3.88*  CKTOTAL  --   --  96  --    Estimated Creatinine Clearance: 13.3 mL/min (A) (by C-G formula based on SCr of 3.88 mg/dL (H)).  Medical History: Past Medical History:  Diagnosis Date   Acute hypoxemic respiratory failure (HCC)    Anemia in stage 4 chronic kidney disease (HCC)    Anginal pain (HCC)    Anxiety    Aortic atherosclerosis (HCC)    CHF (congestive heart failure) (HCC)    a.) TTE 09/30/2021: EF 55-60%, no RWMAs, mild MR, G1DD   Chronic pain    Chronic radicular pain of lower back    CKD (chronic kidney disease), stage IV (HCC)    Complication of anesthesia    a.) delayed emergence following ureteroscopy 07/2023   Coronary artery calcification seen on CT scan    Costochondritis    DDD (degenerative disc disease), lumbar    Depression    Dyspnea    GERD (gastroesophageal reflux disease)    Hiatal hernia    Hip dysplasia    Hyperlipidemia    Hypertension    Insomnia    Iron deficiency    Iron deficiency anemia    Low back pain    Low vitamin B12 level    Lumbar facet joint pain    Migraines    Nose colonized with MRSA 10/03/2020   a.) preop PCR (+) 10/03/2020 prior to RIGHT TKA; b.) preop PCR (+) 08/25/2023 prior to RIGHT REVERSE SHOULDER ARTHROPLASTY; BICEPS TENODESIS   Numbness and tingling of right leg    OA (osteoarthritis)    Obesity    OSA (obstructive sleep apnea)    a.) not currently utilizing nocturnal PAP therapy; positional. PCCM feels as if symptom can be mitigated with  diet/exercise/weight loss unless develops significant symptoms.   Osteoporosis    a.) recieves denosumab injections   Pelvic fracture (HCC)    Pneumonia    Pulmonary fibrosis (HCC)    Raynaud's disease without gangrene    Restless leg syndrome    a.) on pramipexole + oral Fe supplementation   Rheumatoid arthritis (HCC)    a.) Tx'd with hydroxychloroquine   Right renal mass 05/28/2023   a.) CT renal 05/28/2023:  5.7 cm mass in the medial aspect of right kidney   Seasonal allergies    Thoracic compression fracture (HCC)    Medications:  No anticoagulation prior to admission per my chart review  Assessment: 72 y/o F with a past medical history of CKD IV, HFpEF, hypertension, RA, OSA not on CPAP who came [facility] 12/6 for right reverse shoulder arthroplasty after for suspicion of septic joint. Patient experienced cardiac arrest post-operatively on 12/7. She is intubated, sedated and on mechanical ventilation in the ICU. Hospital course now further complicated by paroxysmal Afib with RVR. Patient has been placed on amiodarone. Heparin was held 12/18 for a gastrostomy tube and then restarted. CBC is stable. Pharmacy consulted for heparin.   12/10 20:29 HL <  0.10 12/11 0544 HL 0.13, subtherapeutic 12/11 1539 HL 0.29, subtherapeutic 12/12 0039 HL 0.34, therapeutic x 1 12/12 0920 HL 0.28, subtherapeutic 12/12 1817 HL 0.42, therapeutic x 1 12/13 0152 HL 0.39, therapeutic x 2 12/14 0434 HL 0.42, therapeutic x 3 12/15 0322 HL 0.31, therapeutic x 4 12/16 0430 HL 0.34, therapeutic x 5 12/17 0429 HL 0.19, SUBtherapeutic  12/17 1938 HL 0.57, therapeutic x 1 12/18 0727 HL 0.34  12/19 0629 HL 0.15   Goal of Therapy:  Heparin level 0.3-0.7 units/ml Monitor platelets by anticoagulation protocol: Yes   Plan:  Heparin level is tsubherapeutic. Will give heparin 2000 units x 1 and increase heparin infusion to 1900 units/hr. Recheck heparin level in 8 hours and CBC with AM labs.   Ronnald Ramp,  PharmD Clinical Pharmacist 12/01/2023 8:29 AM

## 2023-12-01 NOTE — Consult Note (Signed)
PHARMACY CONSULT NOTE - ELECTROLYTES  Pharmacy Consult for Electrolyte Monitoring and Replacement   Recent Labs: Height: 5\' 3"  (160 cm) Weight: 77.9 kg (171 lb 11.8 oz) IBW/kg (Calculated) : 52.4 Estimated Creatinine Clearance: 13.3 mL/min (A) (by C-G formula based on SCr of 3.88 mg/dL (H)).  Potassium (mmol/L)  Date Value  12/01/2023 4.8   Magnesium (mg/dL)  Date Value  78/29/5621 2.4   Calcium (mg/dL)  Date Value  30/86/5784 8.8 (L)   Albumin (g/dL)  Date Value  69/62/9528 2.5 (L)   Phosphorus (mg/dL)  Date Value  41/32/4401 6.9 (H)   Sodium (mmol/L)  Date Value  12/01/2023 144  09/12/2023 142   Assessment  Tonya Cummings is a 72 y.o. female with a PMH significant for CKD stage IV, heart failure with preserved EF, hypertension, rheumatoid arthritis, OSA not on CPAP. Pharmacy has been consulted to monitor and replace electrolytes.  Diet: NPO MIVF: D5 @ 50 mL/hr, free water 100 ml q4H,  Pertinent medications: amio 200 mg BID  Goal of Therapy: Electrolytes WNL  Plan:  No replacement needed at this time F/u with AM labs.   Thank you for allowing pharmacy to be a part of this patient's care.  Ronnald Ramp, PharmD, BCPS 12/01/2023 8:35 AM

## 2023-12-01 NOTE — TOC Progression Note (Signed)
Transition of Care [facility]) - Progression Note    Patient Details  Name: Tonya Cummings MRN: [id number] Date of Birth: 05/18/1951  [facility] of [facility] CM/SW Contact  Truddie Hidden, RN Phone Number: 12/01/2023, 10:51 AM  Clinical Narrative:    Attempt to reach patient's spouse to discuss discharge plan. No answer. Unable to leave message due to VM not being set up.    Expected Discharge Plan: OP Rehab Barriers to Discharge: Continued Medical Work up  Expected Discharge Plan and Services       Living arrangements for the past 2 months: Single Family Home                                       Social Determinants of Health (SDOH) Interventions SDOH Screenings   Food Insecurity: Patient Unable To Answer (11/20/2023)  Housing: Patient Unable To Answer (11/20/2023)  Transportation Needs: Patient Unable To Answer (11/20/2023)  Utilities: Patient Unable To Answer (11/20/2023)  Depression (PHQ2-9): Low Risk  (09/02/2022)  Tobacco Use: Low Risk  (11/18/2023)    Readmission Risk Interventions    11/19/2023   11:23 AM 10/04/2023   10:38 AM 09/02/2023   10:04 AM  Readmission Risk Prevention Plan  Transportation Screening Complete Complete Complete  PCP or Specialist Appt within 3-5 Days Complete Complete Complete  HRI or Home Care Consult Complete  Complete  Social Work Consult for Recovery Care Planning/Counseling Complete Complete Complete  Palliative Care Screening Not Applicable Not Applicable Not Applicable  Medication Review Oceanographer) Complete Complete Complete

## 2023-12-01 NOTE — Plan of Care (Signed)
  Problem: Clinical Measurements: Goal: Respiratory complications will improve Outcome: Progressing Goal: Cardiovascular complication will be avoided Outcome: Progressing   Problem: Clinical Measurements: Goal: Cardiovascular complication will be avoided Outcome: Progressing   Problem: Nutrition: Goal: Adequate nutrition will be maintained Outcome: Not Progressing   Problem: Coping: Goal: Level of anxiety will decrease Outcome: Not Progressing

## 2023-12-01 NOTE — Consult Note (Deleted)
PHARMACY CONSULT NOTE - ELECTROLYTES  Pharmacy Consult for Electrolyte Monitoring and Replacement   Recent Labs: Height: 5\' 3"  (160 cm) Weight: 77.9 kg (171 lb 11.8 oz) IBW/kg (Calculated) : 52.4 Estimated Creatinine Clearance: 17.1 mL/min (A) (by C-G formula based on SCr of 3.03 mg/dL (H)).  Potassium (mmol/L)  Date Value  11/30/2023 4.6   Magnesium (mg/dL)  Date Value  51/88/4166 2.4   Calcium (mg/dL)  Date Value  06/11/1600 9.2   Albumin (g/dL)  Date Value  09/32/3557 2.5 (L)   Phosphorus (mg/dL)  Date Value  32/20/2542 6.3 (H)   Sodium (mmol/L)  Date Value  11/30/2023 148 (H)  09/12/2023 142   Assessment  Tonya Cummings is a 72 y.o. female with a PMH significant for CKD stage IV, heart failure with preserved EF, hypertension, rheumatoid arthritis, OSA not on CPAP. Periprosthetic fracture s/p conversion to longstem humeral component with ORIF 10/03/2023. Found 11/18/2023 to have elevated inflammatory markers and small area of purulence during planned arthroplasty. S/p arthroplasty experienced respiratory and was admitted to the ICU were she also had cardiac (NSTEMI) arrest and new onset afib. Patient s/p NG tube placement 12/18 due to dysphagia. Acute on chronic stage IV kidney injury. Pharmacy has been consulted to monitor and replace electrolytes.  Diet: NPO/aspiration precautions MIVF: D5 @ 50 mL/hr Pertinent medications:  No diuretics, MRA, ACEi/ARBs/ARNI at this time Feeding supplement with minimal phosphate  Goal of Therapy: Electrolytes WNL K = 4.6 Phos = 6.3 Mg = 2.5  Plan:  No electrolyte replacement indicated at this time  K, Phos, Mg up-trending at this time with increase Scr and decreased UOP Nephrology following; currently no indication for dialysis Foley Catheter in place  UOP 300 mL 12/18 down from ~1.1 L12/17 Check BMP, Mg, Phos with AM labs  Thank you for allowing pharmacy to be a part of this patient's care.  Effie Shy,  PharmD Pharmacy Resident  12/01/2023 7:05 AM

## 2023-12-02 ENCOUNTER — Inpatient Hospital Stay: Payer: Medicare HMO

## 2023-12-02 DIAGNOSIS — Z22322 Carrier or suspected carrier of Methicillin resistant Staphylococcus aureus: Secondary | ICD-10-CM | POA: Diagnosis not present

## 2023-12-02 DIAGNOSIS — D509 Iron deficiency anemia, unspecified: Secondary | ICD-10-CM | POA: Diagnosis not present

## 2023-12-02 DIAGNOSIS — I483 Typical atrial flutter: Secondary | ICD-10-CM

## 2023-12-02 DIAGNOSIS — Z7189 Other specified counseling: Secondary | ICD-10-CM | POA: Diagnosis not present

## 2023-12-02 DIAGNOSIS — M9731XA Periprosthetic fracture around internal prosthetic right shoulder joint, initial encounter: Secondary | ICD-10-CM | POA: Diagnosis not present

## 2023-12-02 DIAGNOSIS — N184 Chronic kidney disease, stage 4 (severe): Secondary | ICD-10-CM | POA: Diagnosis not present

## 2023-12-02 LAB — CBC
HCT: 26.1 % — ABNORMAL LOW (ref 36.0–46.0)
Hemoglobin: 8.1 g/dL — ABNORMAL LOW (ref 12.0–15.0)
MCH: 27.8 pg (ref 26.0–34.0)
MCHC: 31 g/dL (ref 30.0–36.0)
MCV: 89.7 fL (ref 80.0–100.0)
Platelets: 299 10*3/uL (ref 150–400)
RBC: 2.91 MIL/uL — ABNORMAL LOW (ref 3.87–5.11)
RDW: 18.6 % — ABNORMAL HIGH (ref 11.5–15.5)
WBC: 29.2 10*3/uL — ABNORMAL HIGH (ref 4.0–10.5)
nRBC: 4.3 % — ABNORMAL HIGH (ref 0.0–0.2)

## 2023-12-02 LAB — BASIC METABOLIC PANEL
Anion gap: 15 (ref 5–15)
BUN: 113 mg/dL — ABNORMAL HIGH (ref 8–23)
CO2: 21 mmol/L — ABNORMAL LOW (ref 22–32)
Calcium: 7.9 mg/dL — ABNORMAL LOW (ref 8.9–10.3)
Chloride: 106 mmol/L (ref 98–111)
Creatinine, Ser: 4.83 mg/dL — ABNORMAL HIGH (ref 0.44–1.00)
GFR, Estimated: 9 mL/min — ABNORMAL LOW (ref >60)
Glucose, Bld: 160 mg/dL — ABNORMAL HIGH (ref 70–99)
Potassium: 4.8 mmol/L (ref 3.5–5.1)
Sodium: 142 mmol/L (ref 135–145)

## 2023-12-02 LAB — GLUCOSE, CAPILLARY
Glucose-Capillary: 132 mg/dL — ABNORMAL HIGH (ref 70–99)
Glucose-Capillary: 152 mg/dL — ABNORMAL HIGH (ref 70–99)

## 2023-12-02 LAB — PHOSPHORUS: Phosphorus: 5.6 mg/dL — ABNORMAL HIGH (ref 2.5–4.6)

## 2023-12-02 LAB — AMMONIA: Ammonia: 35 umol/L (ref 9–35)

## 2023-12-02 LAB — MAGNESIUM: Magnesium: 2.6 mg/dL — ABNORMAL HIGH (ref 1.7–2.4)

## 2023-12-02 MED ORDER — ACETAMINOPHEN 650 MG RE SUPP: 650.0000 mg | Freq: Four times a day (QID) | RECTAL | Status: DC | PRN

## 2023-12-02 MED ORDER — LORAZEPAM 2 MG/ML PO CONC: 1.0000 mg | ORAL | Status: DC | PRN

## 2023-12-02 MED ORDER — HALOPERIDOL LACTATE 2 MG/ML PO CONC: 0.5000 mg | ORAL | Status: DC | PRN

## 2023-12-02 MED ORDER — BIOTENE DRY MOUTH MT LIQD: 15.0000 mL | OROMUCOSAL | Status: DC | PRN

## 2023-12-02 MED ORDER — HYDROMORPHONE HCL 1 MG/ML IJ SOLN
0.2500 mg | INTRAMUSCULAR | Status: DC | PRN
  Administered 2023-12-02: 0.5 mg via INTRAVENOUS
  Filled 2023-12-02: qty 0.5

## 2023-12-02 MED ORDER — HALOPERIDOL 0.5 MG PO TABS: 0.5000 mg | ORAL_TABLET | ORAL | Status: DC | PRN

## 2023-12-02 MED ORDER — GLYCOPYRROLATE 0.2 MG/ML IJ SOLN
0.2000 mg | INTRAMUSCULAR | Status: DC | PRN
  Filled 2023-12-02: qty 1

## 2023-12-02 MED ORDER — GLYCOPYRROLATE 1 MG PO TABS: 1.0000 mg | ORAL_TABLET | ORAL | Status: DC | PRN

## 2023-12-02 MED ORDER — HALOPERIDOL LACTATE 5 MG/ML IJ SOLN: 0.5000 mg | INTRAMUSCULAR | Status: DC | PRN

## 2023-12-02 MED ORDER — POLYVINYL ALCOHOL 1.4 % OP SOLN: 1.0000 [drp] | Freq: Four times a day (QID) | OPHTHALMIC | Status: DC | PRN

## 2023-12-02 MED ORDER — LORAZEPAM 1 MG PO TABS: 1.0000 mg | ORAL_TABLET | ORAL | Status: DC | PRN

## 2023-12-02 MED ORDER — SODIUM BICARBONATE 8.4 % IV SOLN
50.0000 meq | Freq: Once | INTRAVENOUS | Status: AC
  Administered 2023-12-02: 50 meq via INTRAVENOUS
  Filled 2023-12-02: qty 50

## 2023-12-02 MED ORDER — ONDANSETRON 4 MG PO TBDP: 4.0000 mg | ORAL_TABLET | Freq: Four times a day (QID) | ORAL | Status: DC | PRN

## 2023-12-02 MED ORDER — LORAZEPAM 2 MG/ML IJ SOLN: 1.0000 mg | INTRAMUSCULAR | Status: DC | PRN

## 2023-12-02 MED ORDER — ONDANSETRON HCL 4 MG/2ML IJ SOLN: 4.0000 mg | Freq: Four times a day (QID) | INTRAMUSCULAR | Status: DC | PRN

## 2023-12-02 MED ORDER — HYDROMORPHONE HCL 1 MG/ML IJ SOLN: 0.2500 mg | INTRAMUSCULAR | Status: DC | PRN

## 2023-12-02 MED ORDER — ACETAMINOPHEN 325 MG PO TABS: 650.0000 mg | ORAL_TABLET | Freq: Four times a day (QID) | ORAL | Status: DC | PRN

## 2023-12-02 MED ORDER — GLYCOPYRROLATE 0.2 MG/ML IJ SOLN
0.2000 mg | INTRAMUSCULAR | Status: DC | PRN
  Administered 2023-12-02: 0.2 mg via INTRAVENOUS
  Filled 2023-12-02: qty 1

## 2023-12-02 NOTE — Progress Notes (Signed)
[facility]  ROUNDING NOTE   Subjective:   Tonya Cummings is a 72  y.o. female with past medical conditions including chronic kidney disease stage IV, proteinuria, hypertension, anemia of chronic kidney disease, RA, OSA and secondary hyperparathyroidism. Patient presented to the hospital for planned right shoulder arthroplasty which was later complicated by PEA cardiac arrest.  Patient is known to our practice and is followed outpatient by Dr. Cherylann Ratel.  Patient is seen and evaluated at bedside in ICU.    Patient seen laying in bed, female at bedside Ill appearing Will spontaneously open eyes, no interaction Hypotensive Labored mouth breathing, 1L [place]  G-tube in place, Osmolite tube feeds @ 45 mL/h  Creatinine 4.83  Objective:  Vital signs in last 24 hours:  Temp:  [97.8 F (36.6 C)-98.3 F (36.8 C)] 97.8 F (36.6 C) (12/20 0737) Pulse Rate:  [57-66] 66 (12/20 0700) Resp:  [16-26] 17 (12/20 0700) BP: (86-131)/(41-77) 131/50 (12/20 0737) SpO2:  [92 %-98 %] 93 % (12/20 0700)  Weight change:  Filed Weights   11/29/23 0449 11/30/23 0500 12/01/23 0500  Weight: 75.9 kg 76 kg 77.9 kg    Intake/Output: I/O last 3 completed shifts: In: 2766.1 [I.V.:1514.4; Other:100; NG/GT:641.8; IV Piggyback:510] Out: 550 [Urine:500; Drains:50]   Intake/Output this shift:  No intake/output data recorded.  Physical Exam: General: Ill appearing  Head: Normocephalic, atraumatic.   Eyes: Anicteric  Lungs:  Coarse breath sounds, [place] O2  Heart: irregular, atrial fibrillation/flutter  Abdomen:  Soft, nontender  Extremities:  No peripheral edema.  Neurologic: Lethargic   Skin: No lesions       Basic Metabolic Panel: Recent Labs  Lab 11/29/23 0429 11/30/23 0727 11/30/23 1752 12/01/23 0629 12/01/23 1658 12/01/23 2237 12/02/23 0621  NA 149* 148*  --  144  --  140 142  K 4.3 4.6  --  4.8  --  4.8 4.8  CL 111 112*  --  109  --  105 106  CO2 23 21*  --  18*  --  18* 21*   GLUCOSE 110* 102*  --  100*  --  146* 160*  BUN 81* 85*  --  98*  --  104* 113*  CREATININE 2.80* 3.03*  --  3.88*  --  4.43* 4.83*  CALCIUM 9.6 9.2  --  8.8*  --  8.2* 7.9*  MG  --  2.5* 2.4 2.4 2.5*  --  2.6*  PHOS 5.0* 5.8* 6.3* 6.9* 6.4*  --  5.6*    Liver Function Tests: Recent Labs  Lab 11/27/23 0322 11/28/23 0430 11/29/23 0429 11/30/23 0727 12/01/23 2237  AST  --   --   --   --  1,921*  ALT  --   --   --   --  891*  ALKPHOS  --   --   --   --  363*  BILITOT  --   --   --   --  0.7  PROT  --   --   --   --  6.7  ALBUMIN 2.3* 2.7* 2.6* 2.5* 2.6*   No results for input(s): "LIPASE", "AMYLASE" in the last 168 hours. Recent Labs  Lab 12/02/23 0224  AMMONIA 35    CBC: Recent Labs  Lab 11/28/23 0430 11/29/23 0429 11/30/23 0727 12/01/23 0629 12/01/23 2237 12/02/23 0621  WBC 14.1* 15.5* 17.6* 36.6*  --  29.2*  HGB 8.3* 7.8* 7.9* 7.5* 8.0* 8.1*  HCT 26.8* 25.1* 26.1* 24.5*  --  26.1*  MCV 89.9 91.6 91.3 93.9  --  89.7  PLT 413* 419* 418* 331  --  299    Cardiac Enzymes: Recent Labs  Lab 11/30/23 0727  CKTOTAL 96    BNP: Invalid input(s): "POCBNP"  CBG: Recent Labs  Lab 12/01/23 1401 12/01/23 1625 12/01/23 2023 12/02/23 0035 12/02/23 0427  GLUCAP 81 138* 130* 132* 152*    Microbiology: Results for orders placed or performed during the hospital encounter of 11/18/23  Surgical pcr screen     Status: Abnormal   Collection Time: 11/18/23 12:11 PM   Specimen: Nasal Mucosa; Nasal Swab  Result Value Ref Range Status   MRSA, PCR NEGATIVE NEGATIVE Final   Staphylococcus aureus POSITIVE (A) NEGATIVE Final    Comment: (NOTE) The Xpert SA Assay (FDA approved for NASAL specimens in patients 50 years of age and older), is one component of a comprehensive surveillance program. It is not intended to diagnose infection nor to guide or monitor treatment. Performed at [facility], [address], [place], [place] [address]   Aerobic/Anaerobic  Culture w Gram Stain (surgical/deep wound)     Status: Abnormal (Preliminary result)   Collection Time: 11/18/23  2:29 PM   Specimen: Path Tissue  Result Value Ref Range Status   Specimen Description   Final    TISSUE Performed at [facility], [address] [place], [place] [address]    Special Requests RT HUMERUS HOLD 21DAYS  Final   Gram Stain   Final    RARE WBC PRESENT,BOTH PMN AND MONONUCLEAR NO ORGANISMS SEEN    Culture (A)  Final    CANDIDA ALBICANS CONTINUING TO HOLD Performed at [facility], [address] [place], [place] [address]    Report Status PENDING  Incomplete  Aerobic/Anaerobic Culture w Gram Stain (surgical/deep wound)     Status: None (Preliminary result)   Collection Time: 11/18/23  2:43 PM   Specimen: Path Tissue  Result Value Ref Range Status   Specimen Description   Final    TISSUE Performed at [facility], [address] [place], [place] [address]    Special Requests TUBEROSITY,HOLD 21DAYS  Final   Gram Stain   Final    FEW WBC PRESENT,BOTH PMN AND MONONUCLEAR NO ORGANISMS SEEN    Culture   Final    NO GROWTH 14 DAYS CONTINUING TO HOLD Performed at [facility], [address] [place], [place] [address]    Report Status PENDING  Incomplete  Aerobic/Anaerobic Culture w Gram Stain (surgical/deep wound)     Status: Abnormal (Preliminary result)   Collection Time: 11/18/23  2:52 PM   Specimen: Path Tissue  Result Value Ref Range Status   Specimen Description   Final    TISSUE Performed at [facility], [address] [place], [place] [address]    Special Requests RT HUMERAL TRAY HOLD 21 DAYS  Final   Gram Stain   Final    RARE WBC PRESENT, PREDOMINANTLY MONONUCLEAR NO ORGANISMS SEEN    Culture (A)  Final    CANDIDA ALBICANS CONTINUING TO HOLD Performed at [facility], [address], [place], [place] [address]    Report Status PENDING  Incomplete  Aerobic/Anaerobic Culture w Gram  Stain (surgical/deep wound)     Status: None (Preliminary result)   Collection Time: 11/18/23  2:58 PM   Specimen: Path Tissue  Result Value Ref Range Status   Specimen Description   Final  TISSUE Performed at [facility], [address] [place], [place] [address]    Special Requests GLENOSPHERE,HOLD 21 DAYS  Final   Gram Stain   Final    RARE WBC PRESENT,BOTH PMN AND MONONUCLEAR NO ORGANISMS SEEN    Culture   Final    NO GROWTH 14 DAYS CONTINUING TO HOLD Performed at [facility], [address] [place], [place] [address]    Report Status PENDING  Incomplete  Aerobic/Anaerobic Culture w Gram Stain (surgical/deep wound)     Status: None (Preliminary result)   Collection Time: 11/18/23  3:06 PM   Specimen: Path Tissue  Result Value Ref Range Status   Specimen Description   Final    TISSUE Performed at [facility], [address] [place], [place] [address]    Special Requests RT HUMEREUS CANAL HOLD 21DAYS  Final   Gram Stain   Final    FEW WBC PRESENT, PREDOMINANTLY MONONUCLEAR NO ORGANISMS SEEN    Culture   Final    NO GROWTH 14 DAYS CONTINUING TO HOLD Performed at [facility], [address], [place], [place] [address]    Report Status PENDING  Incomplete  Culture, blood (Routine X 2) w Reflex to ID Panel     Status: None   Collection Time: 11/18/23  7:22 PM   Specimen: BLOOD  Result Value Ref Range Status   Specimen Description BLOOD BLOOD LEFT HAND  Final   Special Requests   Final    BOTTLES DRAWN AEROBIC AND ANAEROBIC Blood Culture adequate volume   Culture   Final    NO GROWTH 5 DAYS Performed at [facility], [address] [place], [place] [address]    Report Status 11/23/2023 FINAL  Final  Culture, blood (Routine X 2) w Reflex to ID Panel     Status: None   Collection Time: 11/18/23  7:28 PM   Specimen: BLOOD  Result Value Ref Range Status   Specimen Description BLOOD BLOOD LEFT ARM  Final   Special  Requests   Final    BOTTLES DRAWN AEROBIC AND ANAEROBIC Blood Culture adequate volume   Culture   Final    NO GROWTH 5 DAYS Performed at [facility], [address] [place], [place] [address]    Report Status 11/23/2023 FINAL  Final  MRSA Next Gen by PCR, Nasal     Status: None   Collection Time: 11/19/23  1:57 PM   Specimen: Nasal Mucosa; Nasal Swab  Result Value Ref Range Status   MRSA by PCR Next Gen NOT DETECTED NOT DETECTED Final    Comment: (NOTE) The GeneXpert MRSA Assay (FDA approved for NASAL specimens only), is one component of a comprehensive MRSA colonization surveillance program. It is not intended to diagnose MRSA infection nor to guide or monitor treatment for MRSA infections. Test performance is not FDA approved in patients less than 98 years old. Performed at [facility], [address] [place], [place] [address]   Culture, Respiratory w Gram Stain     Status: None   Collection Time: 11/21/23  3:18 PM   Specimen: Tracheal Aspirate; Respiratory  Result Value Ref Range Status   Specimen Description   Final    TRACHEAL ASPIRATE Performed at [facility], [address], [place], [place] [address]    Special Requests   Final    NONE Performed at [facility], [address] [place] [place], [place] [address]  Gram Stain   Final    ABUNDANT WBC PRESENT, PREDOMINANTLY PMN RARE GRAM POSITIVE COCCI IN PAIRS RARE GRAM NEGATIVE RODS    Culture   Final    FEW Normal respiratory flora-no Staph aureus or Pseudomonas seen Performed at [facility], [address] [place], [place] [address]    Report Status 11/23/2023 FINAL  Final    Coagulation Studies: No results for input(s): "LABPROT", "INR" in the last 72 hours.   Urinalysis: No results for input(s): "COLORURINE", "LABSPEC", "PHURINE", "GLUCOSEU", "HGBUR", "BILIRUBINUR", "KETONESUR", "PROTEINUR", "UROBILINOGEN", "NITRITE", "LEUKOCYTESUR" in the last 72  hours.  Invalid input(s): "APPERANCEUR"    Imaging: US Abdomen Limited RUQ (LIVER/GB) Result Date: 12/02/2023 CLINICAL DATA:  [id number] Liver failure, acute [id number] EXAM: ULTRASOUND ABDOMEN LIMITED RIGHT UPPER QUADRANT COMPARISON:  MRI 06/09/2023 FINDINGS: Gallbladder: Absent Common bile duct: Diameter: 8 mm in proximal diameter, stable since prior MRI examination Liver: No focal lesion identified. Within normal limits in parenchymal echogenicity. Portal vein is patent on color Doppler imaging with normal direction of blood flow towards the liver. Other: Solid 4.4 cm intrarenal mass is seen within the visualized right kidney in keeping with known renal cell carcinoma better seen on prior MRI examination of 10/11/2023 IMPRESSION: 1. No acute abnormality identified. 2. Status post cholecystectomy. 3. 4.4 cm solid intrarenal mass within the visualized right kidney in keeping with known renal cell carcinoma better seen on prior MRI examination of 10/11/2023. Electronically Signed   By: Helyn Numbers M.D.   On: 12/02/2023 03:17   IR GASTROSTOMY TUBE MOD SED Result Date: 11/30/2023 INDICATION: 72 year old female with dysphagia. She presents for percutaneous gastrostomy tube placement. EXAM: Fluoroscopically guided placement of percutaneous pull-through gastrostomy tube Interventional Radiologist:  Sterling Big, MD MEDICATIONS: 2 g Ancef; Antibiotics were administered within 1 hour of the procedure. ANESTHESIA/SEDATION: Versed 1 mg IV; Fentanyl 50 mcg IV administered by the radiology nurse Moderate Sedation Time:  18 minutes The patient's vital signs and level of consciousness were continuously monitored during the procedure by the interventional radiology nurse under my direct supervision. CONTRAST:  10mL OMNIPAQUE IOHEXOL 300 MG/ML  SOLN FLUOROSCOPY: Radiation exposure index: 51 mGy reference air kerma COMPLICATIONS: None immediate. PROCEDURE: Informed written consent was obtained from the patient after a  thorough discussion of the procedural risks, benefits and alternatives. All questions were addressed. Maximal Sterile Barrier Technique was utilized including caps, mask, sterile gowns, sterile gloves, sterile drape, hand hygiene and skin antiseptic. A timeout was performed prior to the initiation of the procedure. Maximal barrier sterile technique utilized including caps, mask, sterile gowns, sterile gloves, large sterile drape, hand hygiene, and chlorhexadine skin prep. An angled catheter was advanced over a wire under fluoroscopic guidance through the nose, down the esophagus and into the body of the stomach. The stomach was then insufflated with several 100 ml of air. Fluoroscopy confirmed location of the gastric bubble, as well as inferior displacement of the barium stained colon. Under direct fluoroscopic guidance, a single T-tack was placed, and the anterior gastric wall drawn up against the anterior abdominal wall. Percutaneous access was then obtained into the mid gastric body with an 18 gauge sheath needle. Aspiration of air, and injection of contrast material under fluoroscopy confirmed needle placement. An Amplatz wire was advanced in the gastric body and the access needle exchanged for a 9-French vascular sheath. A snare device was advanced through the vascular sheath and an Amplatz wire advanced through the angled catheter. The Amplatz wire was successfully snared and  this was pulled up through the esophagus and out the mouth. A 20-French Burnell Blanks MIC-PEG tube was then connected to the snare and pulled through the mouth, down the esophagus, into the stomach and out to the anterior abdominal wall. Hand injection of contrast material confirmed intragastric location. The T-tack retention suture was then cut. The pull through peg tube was then secured with the external bumper and capped. The patient will be observed for several hours with the newly placed tube on low wall suction to evaluate for any  post procedure complication. The patient tolerated the procedure well, there is no immediate complication. IMPRESSION: Successful placement of a 20 French pull through gastrostomy tube. Electronically Signed   By: Malachy Moan M.D.   On: 11/30/2023 15:38      Medications:      amiodarone  200 mg Per Tube BID   Chlorhexidine Gluconate Cloth  6 each Topical Daily   lidocaine  1 patch Transdermal Q24H   metoprolol tartrate  12.5 mg Oral BID   mupirocin ointment   Topical BID   sodium chloride flush  3 mL Intravenous Q12H   sodium chloride flush  3 mL Intravenous Q12H   acetaminophen **OR** acetaminophen, antiseptic oral rinse, bisacodyl, glycopyrrolate **OR** glycopyrrolate **OR** glycopyrrolate, haloperidol **OR** haloperidol **OR** haloperidol lactate, HYDROmorphone (DILAUDID) injection, ipratropium-albuterol, LORazepam **OR** LORazepam **OR** LORazepam, ondansetron **OR** ondansetron (ZOFRAN) IV, mouth rinse, mouth rinse, polyethylene glycol, polyvinyl alcohol, senna-docusate  Assessment/ Plan:  Tonya Cummings is a 72 y.o.  female with past medical conditions including chronic kidney disease stage IV, proteinuria, hypertension, anemia of chronic kidney disease, RA, OSA and secondary hyperparathyroidism. Patient presented to the hospital for planned right shoulder arthroplasty which was later complicated by PEA cardiac arrest.  She is currently admitted for Periprosthetic fracture around internal prosthetic right shoulder joint [M97.31XA] Septic arthritis (HCC) [M00.9]   Acute Kidney Injury on chronic kidney disease stage 4 with baseline creatinine 2.08 on 10/04/2023.  Acute kidney injury secondary to ATN from hypoperfusion.  Chronic kidney disease secondary to hypertension and rheumatoid arthritis with prior NSAID use.  No IV contrast exposure.    -Creatinine continues to rise, BUN 113, may contribute to decline in mentation. Discussed with female at bedside that dialysis would be  challenging with hypotension and would not improve overall health status. Agree with palliative care consult to determine GOC.  Patient transitioned to comfort measures only after GOC discussion.    Lab Results  Component Value Date   CREATININE 4.83 (H) 12/02/2023   CREATININE 4.43 (H) 12/01/2023   CREATININE 3.88 (H) 12/01/2023    Intake/Output Summary (Last 24 hours) at 12/02/2023 1238 Last data filed at 12/02/2023 0624 Gross per 24 hour  Intake 1581.84 ml  Output 200 ml  Net 1381.84 ml   2. Anemia of chronic kidney disease with acute blood loss Lab Results  Component Value Date   HGB 8.1 (L) 12/02/2023  Patient has received blood transfusions during this admission.    3. Secondary Hyperparathyroidism: with outpatient labs: PTH 45, phosphorus 4.7, calcium 9.4 on 11/07/23.   Lab Results  Component Value Date   CALCIUM 7.9 (L) 12/02/2023   PHOS 5.6 (H) 12/02/2023     4.  Right reverse shoulder arthroplasty loosening after periprosthetic fracture with a right periprosthetic infection and humerus fractures.  Patient underwent right reverse shoulder arthroplasty explant of glenoid and humeral components and open treatment of right humerus fracture with intramedullary device.  This was done on 11/18/2023.  Due to transition to comfort measures, we will sign off  at this time.      LOS: 14 Tonya Cummings 12/20/202412:38 PM

## 2023-12-02 NOTE — Care Management Important Message (Signed)
Important Message  Patient Details  Name: Tonya Cummings MRN: [id number] Date of Birth: 21-Aug-1951   Important Message Given:  Yes - Medicare IM     Bernadette Hoit 12/02/2023, 10:25 AM

## 2023-12-02 NOTE — Consult Note (Addendum)
[facility] Liaison Consult Note    12/02/2023  MONTESSA GARSKE 04-01-1951 [id number]  Insurance: [facility] Medicare  Primary Care Provider: Myrene Buddy, NP, [facility]  Remote review for patient admitted to [facility] for Tonya Cummings.  Chart reviewed  for 14 day LLOS, as reviewed from Inpatient TOC, Palliative consult notes and reveals the patient is currently transitioning to Hospice Care for comfort measures.  Plan: Following  For questions,   .Charlesetta Shanks, RN, BSN, CCM [employer], [facility] Liaison Direct Dial: [phone] or secure chat Email: [email or web address]

## 2023-12-02 NOTE — Progress Notes (Signed)
Daily Progress Note   Patient Name: Tonya Cummings       Date: 12/02/2023 DOB: [birth year]  Age: 72 y.o. MRN#: [id number] Attending Physician: Marcelino Duster, MD Primary Care Physician: Myrene Buddy, NP Admit Date: 11/18/2023  Reason for Consultation/Follow-up: Establishing goals of care  Subjective: Notes and labs reviewed.  Spoke with husband at bedside.  He states he has spoken with the attending physician and he would like to transition to comfort care.  MOST form was completed and medications for comfort ordered.  Later returned to bedside at family's request to speak with patient's daughters.  Discussed her status.  Questions answered as able.  Daughter discusses her faith, and concerns.  She does not want to lose her mother but does not want her to suffer.  Nursing in to provide Dilaudid and Robinul.  I completed a MOST form today with husband and the signed original was placed in the chart. Each section of options on the form were reviewed in full detail and any questions were answered as needed. The form was scanned and sent to medical records for it to be uploaded under ACP tab in Epic. A photocopy was also placed in the chart to be scanned into EMR. The patient outlined their wishes for the following treatment decisions:  Cardiopulmonary Resuscitation: Do Not Attempt Resuscitation (DNR/No CPR)  Medical Interventions: Comfort Measures: Keep clean, warm, and dry. Use medication by any route, positioning, wound care, and other measures to relieve pain and suffering. Use oxygen, suction and manual treatment of airway obstruction as needed for comfort. Do not transfer to the hospital unless comfort needs cannot be met in current location.  Antibiotics: No antibiotics (use  other measures to relieve symptoms)  IV Fluids: No IV fluids (provide other measures to ensure comfort)  Feeding Tube: No feeding tube     Length of Stay: 14  Current Medications: Scheduled Meds:   amiodarone  200 mg Per Tube BID   Chlorhexidine Gluconate Cloth  6 each Topical Daily   lidocaine  1 patch Transdermal Q24H   metoprolol tartrate  12.5 mg Oral BID   mupirocin ointment   Topical BID   sodium chloride flush  3 mL Intravenous Q12H   sodium chloride flush  3 mL Intravenous Q12H    Continuous Infusions:   PRN  Meds: acetaminophen **OR** acetaminophen, antiseptic oral rinse, bisacodyl, glycopyrrolate **OR** glycopyrrolate **OR** glycopyrrolate, haloperidol **OR** haloperidol **OR** haloperidol lactate, HYDROmorphone (DILAUDID) injection, ipratropium-albuterol, LORazepam **OR** LORazepam **OR** LORazepam, ondansetron **OR** ondansetron (ZOFRAN) IV, mouth rinse, mouth rinse, polyethylene glycol, polyvinyl alcohol, senna-docusate  Physical Exam Constitutional:      Comments: Eyes closed  Pulmonary:     Comments: Some work of breathing noted at time of second visit. Skin:    General: Skin is warm and dry.             Vital Signs: BP (!) 131/50 (BP Location: Right Arm)   Pulse 66   Temp 97.8 F (36.6 C) (Axillary)   Resp 17   Ht 5\' 3"  (1.6 m)   Wt 77.9 kg   SpO2 93%   BMI 30.42 kg/m  SpO2: SpO2: 93 % O2 Device: O2 Device: Nasal Cannula O2 Flow Rate: O2 Flow Rate (L/min): 1 L/min  Intake/output summary:  Intake/Output Summary (Last 24 hours) at 12/02/2023 1649 Last data filed at 12/02/2023 4010 Gross per 24 hour  Intake 1581.84 ml  Output 200 ml  Net 1381.84 ml   LBM: Last BM Date : 11/29/23 Baseline Weight: Weight: 77.1 kg Most recent weight: Weight: 77.9 kg    Patient Active Problem List   Diagnosis Date Noted   Positive result for methicillin resistant Staphylococcus aureus (MRSA) screening 11/30/2023   Pressure injury of skin 11/30/2023   Atrial  flutter (HCC) 11/29/2023   Atrial fibrillation with RVR (HCC) 11/26/2023   Cardiac arrest (HCC) 11/21/2023   Tension pneumothorax 11/19/2023   Septic arthritis (HCC) 11/18/2023   Acute on chronic anemia 11/18/2023   (HFpEF) heart failure with preserved ejection fraction (HCC) 11/18/2023   CKD (chronic kidney disease) stage 4, GFR 15-29 ml/min (HCC) 11/18/2023   ILD (interstitial lung disease) (HCC) 11/18/2023   Periprosthetic fracture around internal prosthetic right shoulder joint 10/03/2023   Renal mass, right 08/31/2023   Leukocytosis 08/31/2023   OSA (obstructive sleep apnea) 08/31/2023   Elevated troponin 08/31/2023   Leg pain, bilateral 08/31/2023   At risk for constipation 08/31/2023   Rotator cuff arthropathy 08/30/2023   Anemia in stage 4 chronic kidney disease (HCC) 01/10/2023   Long-term use of immunosuppressant medication 03/09/2022   Dyspnea 09/29/2021   Acute CHF (congestive heart failure) (HCC) 09/29/2021   Acute respiratory failure with hypoxia (HCC) 09/29/2021   Total knee replacement status 10/13/2020   CKD (chronic kidney disease) stage 3, GFR 30-59 ml/min (HCC) 10/12/2020   Hyperlipidemia 10/12/2020   Hypertension 10/12/2020   Migraine headache 10/12/2020   Restless legs syndrome (RLS) 10/12/2020   Severe obesity (BMI 35.0-39.9) with comorbidity (HCC) 06/30/2020   Primary osteoarthritis of right knee 03/27/2020   Iron deficiency anemia 09/12/2019   Low vitamin B12 level 09/12/2019   Normocytic anemia 09/07/2019   Right medial knee pain 08/09/2018   Depression with anxiety 03/22/2018   Sleep disorder 03/22/2018   Influenza A 02/17/2018   Community acquired pneumonia 02/17/2018   Chronic radicular pain of lower back 02/06/2018   Numbness and tingling of right leg 02/06/2018   Hip dysplasia 02/23/2016   Osteoporosis, post-menopausal 02/23/2016   Raynaud's disease without gangrene 02/23/2016   Seropositive rheumatoid arthritis (HCC) 02/23/2016   Lumbar  facet joint pain 09/18/2014    Palliative Care Assessment & Plan   Recommendations/Plan: Comfort care Anticipate hospital death  Code Status:    Code Status Orders  (From admission, onward)  Start     Ordered   12/02/23 1037  Do not attempt resuscitation (DNR) - Comfort care  Continuous       Question Answer Comment  If patient has no pulse and is not breathing Do Not Attempt Resuscitation   In Pre-Arrest Conditions (Patient Is Breathing and Has a Pulse) Provide comfort measures. Relieve any mechanical airway obstruction. Avoid transfer unless required for comfort.   Consent: Discussion documented in EHR or advanced directives reviewed      12/02/23 1036           Code Status History     Date Active Date Inactive Code Status Order ID Comments User Context   12/02/2023 1031 12/02/2023 1036 Do not attempt resuscitation (DNR) - Comfort care [id number]  Morton Stall, NP Inpatient   11/18/2023 2004 12/02/2023 1031 Full Code [id number]  Signa Kell, MD Inpatient   11/18/2023 1831 11/18/2023 2004 Full Code [id number]  Verdene Lennert, MD Inpatient   10/03/2023 1703 10/04/2023 1920 Full Code [id number]  Signa Kell, MD Inpatient   08/31/2023 1400 09/02/2023 2010 Full Code [id number]  CoxNadyne Coombes, DO Inpatient   08/30/2023 1257 08/31/2023 1400 Full Code [id number]  Signa Kell, MD Inpatient   09/29/2021 1413 10/02/2021 2034 Full Code [id number]  Lurene Shadow, MD ED   10/13/2020 1640 10/15/2020 1951 Full Code [id number]  Donato Heinz, MD Inpatient   02/17/2018 1650 02/20/2018 1818 Full Code [id number]  Altamese Dilling, MD Inpatient       Prognosis:  Hours - Days   Care plan was discussed with attending  Thank you for allowing the Palliative Medicine Team to assist in the care of this patient.    Morton Stall, NP  Please contact Palliative Medicine Team phone at [phone] for questions and concerns.

## 2023-12-02 NOTE — Progress Notes (Signed)
SLP Cancellation Note  Patient Details Name: Tonya Cummings MRN: [id number] DOB: [birth year]   Cancelled treatment:       Reason Eval/Treat Not Completed:  (chart reviewed)  Per chart review and MD note today, pt had a change in medical status: per MD, "overnight her BP, HR dropped. Labs showed liver dysfunction, kidney dysfunction. Her mental status poor. Discussed with Family who agreed w/ Palliative discussion. Palliative team made her Comfort Care only.".  ST services will sign off at this time w/ MD to reconsult if any new needs while admitted.      Jerilynn Som, MS, CCC-SLP Speech Language Pathologist Rehab Services; [facility] [phone] (ascom) Tonya Cummings 12/02/2023, 5:04 PM

## 2023-12-02 NOTE — Progress Notes (Signed)
 Nutrition Brief Note  Chart reviewed. Pt now transitioning to comfort care.  No further nutrition interventions planned at this time.  Please re-consult as needed.   Levada Schilling, RD, LDN, CDCES Registered Dietitian III Certified Diabetes Care and Education Specialist If unable to reach this RD, please use "RD Inpatient" group chat on secure chat between hours of 8am-4 pm daily

## 2023-12-02 NOTE — Progress Notes (Signed)
       CROSS COVER NOTE  NAME: Tonya Cummings MRN: [id number] DOB : [birth year]    Concern as stated by nurse / staff   Message received from [facility] via secure chat " hey Tonya Cummings so this pt came in to have her right shoulder hardware taken out b/c it was infected and then PEA arrested after surgery. she was in ICU on pressors and intubated. She also had a pneumo with chest tube 12/7-12/11. She was also on amio for afibrvr that was been switched to PO. Pt was very drowsy and will only open her eyes for a couple seconds when you yell at her. Anyways, she had a peg tube placed today and tube feeds have been started. FINALLY to my point. her BP is running soft like: 98/44(59), 77/33(48), 97/57(70), 83/73(79). I havent given any meds yet and was obviously going to hold her metop and amio. HR is in the high 50s to low 60s. O2 90-92% on 1L [place] RR 25-35. "  Pertinent findings on chart review:   Assessment and  Interventions   Assessment:  Latest Reference Range & Units 12/01/23 22:37  COMPREHENSIVE METABOLIC PANEL  Rpt !  Sodium 135 - 145 mmol/L 140  Potassium 3.5 - 5.1 mmol/L 4.8  Chloride 98 - 111 mmol/L 105  CO2 22 - 32 mmol/L 18 (L)  Glucose 70 - 99 mg/dL 782 (H)  BUN 8 - 23 mg/dL 956 (H)  Creatinine 2.13 - 1.00 mg/dL 0.86 (H)  Calcium 8.9 - 10.3 mg/dL 8.2 (L)  Anion gap 5 - 15  17 (H)  Alkaline Phosphatase 38 - 126 U/L 363 (H)  Albumin 3.5 - 5.0 g/dL 2.6 (L)  AST 15 - 41 U/L 1,921 (H)  ALT 0 - 44 U/L 891 (H)  Total Protein 6.5 - 8.1 g/dL 6.7  Total Bilirubin <5.7 mg/dL 0.7  GFR, Estimated >84 mL/min 10 (L)  !: Data is abnormal (L): Data is abnormally low (H): Data is abnormally high Rpt: View report in Results Review for more information    12/01/2023    9:39 PM 12/01/2023    8:00 PM 12/01/2023    7:00 PM  Vitals with BMI  Systolic 97    Diastolic 57    Pulse  58 58  CMP and VBG with metabolic acidosis/bicarb deficit, anion gap acidosis and very significant  transaminitis with acute on chronic kidney injury Previously required hig levels of oxygen, now sats great on 1 L Patient MAE but not to command. Opened eyes to noxious stimuli but did not engage  Abdomen soft, no pain behaviors or guarding with palpation. Gtube in place and patent.with TF infusing Plan: 250 ml free water given via tube for hypotension  1 amp sodium bicarb given  abd ultrasound RUQ Lactic acid     Tonya Mesa NP [facility] Cross Cover 7pm-7am - check amion for availability Pager [phone]

## 2023-12-02 NOTE — TOC Progression Note (Signed)
Transition of Care [facility]) - Progression Note    Patient Details  Name: Tonya Cummings MRN: [id number] Date of Birth: April 09, 1951  Transition of Care [facility]) CM/SW Contact  Truddie Hidden, RN Phone Number: 12/02/2023, 4:00 PM  Clinical Narrative:    Spoke with spouse and family at bedside explained role. Patient spouse stated he had talked with palliative. " She may not make it. " Patient spouse and family considering hospice but will talk with palliative first.    Expected Discharge Plan: OP Rehab Barriers to Discharge: Continued Medical Work up  Expected Discharge Plan and Services       Living arrangements for the past 2 months: Single Family Home                                       Social Determinants of Health (SDOH) Interventions SDOH Screenings   Food Insecurity: Patient Unable To Answer (11/20/2023)  Housing: Patient Unable To Answer (11/20/2023)  Transportation Needs: Patient Unable To Answer (11/20/2023)  Utilities: Patient Unable To Answer (11/20/2023)  Depression (PHQ2-9): Low Risk  (09/02/2022)  Tobacco Use: Low Risk  (11/18/2023)    Readmission Risk Interventions    11/19/2023   11:23 AM 10/04/2023   10:38 AM 09/02/2023   10:04 AM  Readmission Risk Prevention Plan  Transportation Screening Complete Complete Complete  PCP or Specialist Appt within 3-5 Days Complete Complete Complete  HRI or Home Care Consult Complete  Complete  Social Work Consult for Recovery Care Planning/Counseling Complete Complete Complete  Palliative Care Screening Not Applicable Not Applicable Not Applicable  Medication Review Oceanographer) Complete Complete Complete

## 2023-12-02 NOTE — Consult Note (Incomplete)
PHARMACY CONSULT NOTE - ELECTROLYTES  Pharmacy Consult for Electrolyte Monitoring and Replacement   Recent Labs: Height: 5\' 3"  (160 cm) Weight: 77.9 kg (171 lb 11.8 oz) IBW/kg (Calculated) : 52.4 Estimated Creatinine Clearance: 11.7 mL/min (A) (by C-G formula based on SCr of 4.43 mg/dL (H)).  Potassium (mmol/L)  Date Value  12/01/2023 4.8   Magnesium (mg/dL)  Date Value  19/14/7829 2.5 (H)   Calcium (mg/dL)  Date Value  56/21/3086 8.2 (L)   Albumin (g/dL)  Date Value  57/84/6962 2.6 (L)   Phosphorus (mg/dL)  Date Value  95/28/4132 6.4 (H)   Sodium (mmol/L)  Date Value  12/01/2023 140  09/12/2023 142   Assessment  Tonya Cummings is a 72 yo female with a PMH significant for CKD stage IV, heart failure with preserved EF, hypertension, RA, OSA not on CPAP. Periprosthetic fracture s/p conversion to longstem humeral component with ORDI 10/03/2023. Found 11/18/2023 to have elevated inflammatory markers and a small area of purulence during planned arthroplasty. S/p arthroplasty experienced respiratory arrest (NSTEMI) and new onset afib. Patient s/p NG tube placement 12/18 dye to dysphagia. Acute on chronic stage IV kidney injury. Pharmacy has been consulted to monitor and replace electrolytes.   Diet: Aspiration precautions MIVF: D5 @ 50 mL/hr, free water 100 ml q4H,  Pertinent medications: amio 200 mg BID  Goal of Therapy: Electrolytes WNL  Plan:  No replacement needed at this time F/u with AM labs.   Thank you for allowing pharmacy to be a part of this patient's care.  Effie Shy, PharmD Pharmacy Resident  12/02/2023 7:23 AM

## 2023-12-02 NOTE — Progress Notes (Signed)
Progress Note   Patient: Tonya Cummings ZOX:[id number] DOB: 03/23/1951 DOA: 11/18/2023     14 DOS: the patient was seen and examined on 12/02/2023   Brief hospital course: 72 year old female patient with a past medical history of CKD stage IV, heart failure with preserved EF, hypertension, rheumatoid arthritis, OSA not on CPAP presents to [facility] on 12/06 for right reverse shoulder arthroplasty after for suspicion of septic joint. She initially underwent right shoulder arthroplasty on 08/30/2023 after which she developed a.  Periprosthetic fracture status post conversion to a longstem humeral component with ORIF on 10/03/2023.  No evidence of infection at the time. Follow-up lab work showed elevated inflammatory markers including ESR suspicious for an infectious process.  Therefore she presents back on 12/06 for shoulder arthroplasty explant of glenoid and humeral component open treatment of right humerus fracture with intramedullary device and insertion of antibiotic delivery system.  Small area of purulence was noted.  Cultures were sent.  Started on daptomycin and ceftriaxone.   She did well immediately postop however on 12/07 she was getting up to the bedside commode she went into respiratory distress with O2 sat dropping becoming apneic and went into PEA arrest.  CPR for roughly 5 to 6-minute prior to ROSC epi x 2.  Not shockable rhythm.  Patient intubated and transferred to the ICU for further care.  Postintubation chest x-ray with right pneumothorax status post chest tube placement 12/07.  HD catheter placed 12/07 anticipating need for dialysis given worsening kidney function.    During ICU stay patient had circulatory shock requiring pressors.  She has elevated troponin in the setting of cardiac arrest, new onset A-fib.  Echocardiogram 12/08 with normal LVEF 55 to 60%.  RV systolic function is normal and size is normal. Ultrasound venous lower extremity 12/08 negative  for DVT. Patient had pneumothorax 12/7 for which a right chest tube was placed which is removed 12/11.  Her kidney function remained stable and nephrologist followed advised no acute indication for dialysis. She is successfully extubated 12/13 and transferred to [facility] service.  12/20- overnight her BP, HR dropped. Labs showed liver dysfunction, kidney dysfunction. Her mental status poor.  Discussed with family who agreed palliative discussion. Palliative team made her comfort care only.  Assessment and Plan: PEA arrest NSTEMI New onset A-fib: Circulatory shock, off pressors transferred out of ICU 12/13. Echo showed EF 55-60%.  She was on amiodarone gtt for rapid A-fib per cardiology recommendations.     Acute hypoxic respiratory failure Right pneumothorax Status post right-sided chest tube placed 12/7, removed 12/11. Post extubation 12/13 Possible aspiration pneumonia Patient is currently on 4L supplemental oxygen. DuoNebs as needed. Continue supplemental O2 to keep sats >=92%, wean as tolerated   Acute toxic metabolic encephalopathy Hospital delirium in the setting of PEA arrest, respiratory failure, requiring sedation.  Continue neuro checks, supportive care   Dysphagia: Hoarseness s/p extubation. S/p G tube placed by radiology.  Hypernatremia Na improved to 144. Continue free water thru G -tube. Will stop D5 once able to tolerate feeds.   Acute on chronic kidney injury stage 4: Acute kidney injury in the setting of hypoperfusion. Septic shock.   Right reverse shoulder arthroplasty 9/17, status post revision surgery on 1021, 11/18/2023- Hardware explanted 12/6.  ID on board. Candida albicans in 2 cultures identified. Her white count worsened even on antibacterials, antifungals.   Anemia of chronic disease: Hemoglobin stable around 8.  No active bleeding.   Hypoglycemia Due to no oral intake,  npo status  Acute liver failure: Possibly due to low BP, multiple  medications. Her overall prognosis poor.   Obesity with BMI 31.40 Contributing to her current condition. OSA not on CPAP.     Debility and deconditioning: She is very weak, not getting out of bed.  Discussed with husband regarding multiple above medical conditions and her poor prognosis. Family had discussed with palliative care and decided on comfort care only. Comfort care order set placed.       Out of bed to chair. Incentive spirometry. Nursing supportive care. Fall, aspiration precautions. DVT prophylaxis   Code Status: Do not attempt resuscitation (DNR) - Comfort care  Subjective: Patient is seen and examined today morning. She is more sleepy and lethargic. Overnight low BP, abnormal labs discussed with husband at bedside. He understands her poor prognosis want to discuss with family.  Physical Exam: Vitals:   12/02/23 0600 12/02/23 0630 12/02/23 0700 12/02/23 0737  BP: (!) 105/43 (!) 119/49 (!) 113/45 (!) 131/50  Pulse: 63 65 66   Resp: 16 18 17    Temp:    97.8 F (36.6 C)  TempSrc:    Axillary  SpO2: 94% 92% 93%   Weight:      Height:        General - Elderly obese ill Caucasian female, sleepy and lethargic, respiratory distress, hoarse voice HEENT - PERRLA, EOMI, atraumatic head, non tender sinuses. Lung - distant breath sounds, basal rales, rhonchi, no wheezes. Heart - S1, S2 heard, no murmurs, rubs, 1+ pedal edema. Abdomen - Soft, non tender obese, bowel sounds good Neuro - sleepy and lethargic, slow mentation, moving extremities. Skin - Warm and dry.  Data Reviewed:      Latest Ref Rng & Units 12/02/2023    6:21 AM 12/01/2023   10:37 PM 12/01/2023    6:29 AM  CBC  WBC 4.0 - 10.5 K/uL 29.2   36.6   Hemoglobin 12.0 - 15.0 g/dL 8.1  8.0  7.5   Hematocrit 36.0 - 46.0 % 26.1   24.5   Platelets 150 - 400 K/uL 299   331       Latest Ref Rng & Units 12/02/2023    6:21 AM 12/01/2023   10:37 PM 12/01/2023    6:29 AM  BMP  Glucose 70 - 99 mg/dL 811   914  782   BUN 8 - 23 mg/dL 956  213  98   Creatinine 0.44 - 1.00 mg/dL 0.86  5.78  4.69   Sodium 135 - 145 mmol/L 142  140  144   Potassium 3.5 - 5.1 mmol/L 4.8  4.8  4.8   Chloride 98 - 111 mmol/L 106  105  109   CO2 22 - 32 mmol/L 21  18  18    Calcium 8.9 - 10.3 mg/dL 7.9  8.2  8.8    US Abdomen Limited RUQ (LIVER/GB) Result Date: 12/02/2023 CLINICAL DATA:  [id number] Liver failure, acute [id number] EXAM: ULTRASOUND ABDOMEN LIMITED RIGHT UPPER QUADRANT COMPARISON:  MRI 06/09/2023 FINDINGS: Gallbladder: Absent Common bile duct: Diameter: 8 mm in proximal diameter, stable since prior MRI examination Liver: No focal lesion identified. Within normal limits in parenchymal echogenicity. Portal vein is patent on color Doppler imaging with normal direction of blood flow towards the liver. Other: Solid 4.4 cm intrarenal mass is seen within the visualized right kidney in keeping with known renal cell carcinoma better seen on prior MRI examination of 10/11/2023 IMPRESSION: 1. No acute abnormality identified. 2. Status post  cholecystectomy. 3. 4.4 cm solid intrarenal mass within the visualized right kidney in keeping with known renal cell carcinoma better seen on prior MRI examination of 10/11/2023. Electronically Signed   By: Helyn Numbers M.D.   On: 12/02/2023 03:17     Family Communication: Discussed with husband at bedside, he understand and agree. All questions answereed.  Disposition: Status is: Inpatient Remains inpatient appropriate because: comfort care only  Planned Discharge Destination: Skilled nursing facility     Time spent: 40 minutes  Author: Marcelino Duster, MD 12/02/2023 4:28 PM Secure chat 7am to 7pm For on call review [email or web address].

## 2023-12-02 NOTE — Plan of Care (Signed)
  Problem: Safety: Goal: Ability to remain free from injury will improve Outcome: Progressing   Problem: Pain Management: Goal: General experience of comfort will improve Outcome: Progressing   Problem: Skin Integrity: Goal: Risk for impaired skin integrity will decrease Outcome: Progressing   Problem: Elimination: Goal: Will not experience complications related to urinary retention Outcome: Progressing

## 2023-12-03 DIAGNOSIS — D649 Anemia, unspecified: Secondary | ICD-10-CM | POA: Diagnosis not present

## 2023-12-03 DIAGNOSIS — K7200 Acute and subacute hepatic failure without coma: Secondary | ICD-10-CM

## 2023-12-03 DIAGNOSIS — I4891 Unspecified atrial fibrillation: Secondary | ICD-10-CM | POA: Diagnosis not present

## 2023-12-03 DIAGNOSIS — J930 Spontaneous tension pneumothorax: Secondary | ICD-10-CM | POA: Diagnosis not present

## 2023-12-03 DIAGNOSIS — N171 Acute kidney failure with acute cortical necrosis: Secondary | ICD-10-CM

## 2023-12-03 DIAGNOSIS — M009 Pyogenic arthritis, unspecified: Secondary | ICD-10-CM | POA: Diagnosis not present

## 2023-12-05 ENCOUNTER — Inpatient Hospital Stay: Payer: Medicare HMO

## 2023-12-06 LAB — AEROBIC/ANAEROBIC CULTURE W GRAM STAIN (SURGICAL/DEEP WOUND)

## 2023-12-09 LAB — AEROBIC/ANAEROBIC CULTURE W GRAM STAIN (SURGICAL/DEEP WOUND)
Culture: NO GROWTH
Culture: NO GROWTH
Culture: NO GROWTH

## 2023-12-14 NOTE — Death Summary Note (Signed)
DEATH SUMMARY   Patient Details  Name: Tonya Cummings MRN: [id number] DOB: 1951-02-02 ION:GEXBMW, Hermenia Fiscal, NP Admission/Discharge Information   Admit Date:  08-Dec-2023  Date of Death:  23-Dec-2023  Time of Death:  5:57 AM  Length of Stay: 15   Principle Cause of death: Septic Arthritis  Hospital Diagnoses: Principal Problem:   Periprosthetic fracture around internal prosthetic right shoulder joint Active Problems:   Septic arthritis (HCC)   Acute on chronic anemia   (HFpEF) heart failure with preserved ejection fraction (HCC)   CKD (chronic kidney disease) stage 4, GFR 15-29 ml/min (HCC)   Hypertension   Seropositive rheumatoid arthritis (HCC)   ILD (interstitial lung disease) (HCC)   Acute respiratory failure with hypoxia (HCC)   Tension pneumothorax   Cardiac arrest (HCC)   Atrial fibrillation with RVR (HCC)   Atrial flutter (HCC)   Positive result for methicillin resistant Staphylococcus aureus (MRSA) screening   Pressure injury of skin   Hospital Course: 73 year old female patient with a past medical history of CKD stage IV, heart failure with preserved EF, hypertension, rheumatoid arthritis, OSA not on CPAP presents to [facility] on 12/08/23 for right reverse shoulder arthroplasty after for suspicion of septic joint. She initially underwent right shoulder arthroplasty on 08/30/2023 after which she developed a.  Periprosthetic fracture status post conversion to a longstem humeral component with ORIF on 10/03/2023.  No evidence of infection at the time. Follow-up lab work showed elevated inflammatory markers including ESR suspicious for an infectious process.  Therefore she presents back on 12-08-23 for shoulder arthroplasty explant of glenoid and humeral component open treatment of right humerus fracture with intramedullary device and insertion of antibiotic delivery system.  Small area of purulence was noted.  Cultures were sent.  Started on  daptomycin and ceftriaxone.   She did well immediately postop however on 12/07 she was getting up to the bedside commode she went into respiratory distress with O2 sat dropping becoming apneic and went into PEA arrest.  CPR for roughly 5 to 6-minute prior to ROSC epi x 2.  Not shockable rhythm.  Patient intubated and transferred to the ICU for further care.  Postintubation chest x-ray with right pneumothorax status post chest tube placement 12/07.  HD catheter placed 12/07 anticipating need for dialysis given worsening kidney function.    During ICU stay patient had circulatory shock requiring pressors.  She has elevated troponin in the setting of cardiac arrest, new onset A-fib.  Echocardiogram 12/08 with normal LVEF 55 to 60%.  RV systolic function is normal and size is normal. Ultrasound venous lower extremity 12/08 negative for DVT. Patient had pneumothorax 12/7 for which a right chest tube was placed which is removed 12/11.  Her kidney function remained stable and nephrologist followed advised no acute indication for dialysis. She is successfully extubated 12/13 and transferred to [facility] service.   12/20- overnight her BP, HR dropped. Labs showed liver dysfunction, kidney dysfunction. Her mental status poor.  I discussed with husband, who agreed that her prognosis is poor given multiorgan failure and her poor condition. Palliative team had long discussion with family members who agreed with DNR- comfort care only. She is transitioned to comfort care only. December 23, 2023 - Patient died at 5:57 AM.       Procedures: Chest tube placed and removed, G tube placement  Consultations: Critical care, ID, Cardiology, Palliative.  The results of significant diagnostics from this hospitalization (including imaging, microbiology, ancillary and laboratory) are listed below  for reference.   Significant Diagnostic Studies: US Abdomen Limited RUQ (LIVER/GB) Result Date: 12/02/2023 CLINICAL DATA:  [id number] Liver  failure, acute [id number] EXAM: ULTRASOUND ABDOMEN LIMITED RIGHT UPPER QUADRANT COMPARISON:  MRI 06/09/2023 FINDINGS: Gallbladder: Absent Common bile duct: Diameter: 8 mm in proximal diameter, stable since prior MRI examination Liver: No focal lesion identified. Within normal limits in parenchymal echogenicity. Portal vein is patent on color Doppler imaging with normal direction of blood flow towards the liver. Other: Solid 4.4 cm intrarenal mass is seen within the visualized right kidney in keeping with known renal cell carcinoma better seen on prior MRI examination of 10/11/2023 IMPRESSION: 1. No acute abnormality identified. 2. Status post cholecystectomy. 3. 4.4 cm solid intrarenal mass within the visualized right kidney in keeping with known renal cell carcinoma better seen on prior MRI examination of 10/11/2023. Electronically Signed   By: Helyn Numbers M.D.   On: 12/02/2023 03:17   IR GASTROSTOMY TUBE MOD SED Result Date: 11/30/2023 INDICATION: 73 year old female with dysphagia. She presents for percutaneous gastrostomy tube placement. EXAM: Fluoroscopically guided placement of percutaneous pull-through gastrostomy tube Interventional Radiologist:  Sterling Big, MD MEDICATIONS: 2 g Ancef; Antibiotics were administered within 1 hour of the procedure. ANESTHESIA/SEDATION: Versed 1 mg IV; Fentanyl 50 mcg IV administered by the radiology nurse Moderate Sedation Time:  18 minutes The patient's vital signs and level of consciousness were continuously monitored during the procedure by the interventional radiology nurse under my direct supervision. CONTRAST:  10mL OMNIPAQUE IOHEXOL 300 MG/ML  SOLN FLUOROSCOPY: Radiation exposure index: 51 mGy reference air kerma COMPLICATIONS: None immediate. PROCEDURE: Informed written consent was obtained from the patient after a thorough discussion of the procedural risks, benefits and alternatives. All questions were addressed. Maximal Sterile Barrier Technique was  utilized including caps, mask, sterile gowns, sterile gloves, sterile drape, hand hygiene and skin antiseptic. A timeout was performed prior to the initiation of the procedure. Maximal barrier sterile technique utilized including caps, mask, sterile gowns, sterile gloves, large sterile drape, hand hygiene, and chlorhexadine skin prep. An angled catheter was advanced over a wire under fluoroscopic guidance through the nose, down the esophagus and into the body of the stomach. The stomach was then insufflated with several 100 ml of air. Fluoroscopy confirmed location of the gastric bubble, as well as inferior displacement of the barium stained colon. Under direct fluoroscopic guidance, a single T-tack was placed, and the anterior gastric wall drawn up against the anterior abdominal wall. Percutaneous access was then obtained into the mid gastric body with an 18 gauge sheath needle. Aspiration of air, and injection of contrast material under fluoroscopy confirmed needle placement. An Amplatz wire was advanced in the gastric body and the access needle exchanged for a 9-French vascular sheath. A snare device was advanced through the vascular sheath and an Amplatz wire advanced through the angled catheter. The Amplatz wire was successfully snared and this was pulled up through the esophagus and out the mouth. A 20-French Burnell Blanks MIC-PEG tube was then connected to the snare and pulled through the mouth, down the esophagus, into the stomach and out to the anterior abdominal wall. Hand injection of contrast material confirmed intragastric location. The T-tack retention suture was then cut. The pull through peg tube was then secured with the external bumper and capped. The patient will be observed for several hours with the newly placed tube on low wall suction to evaluate for any post procedure complication. The patient tolerated the procedure well, there is no immediate  complication. IMPRESSION: Successful placement  of a 20 French pull through gastrostomy tube. Electronically Signed   By: Malachy Moan M.D.   On: 11/30/2023 15:38   CT ABDOMEN PELVIS WO CONTRAST Result Date: 11/30/2023 CLINICAL DATA:  Dysphagia. Evaluate anatomy for gastrostomy tube placement. EXAM: CT ABDOMEN AND PELVIS WITHOUT CONTRAST TECHNIQUE: Multidetector CT imaging of the abdomen and pelvis was performed following the standard protocol without IV contrast. RADIATION DOSE REDUCTION: This exam was performed according to the departmental dose-optimization program which includes automated exposure control, adjustment of the mA and/or kV according to patient size and/or use of iterative reconstruction technique. COMPARISON:  05/28/2023 and chest CT 07/15/2023 FINDINGS: Lower chest: Bilateral pleural effusions, right side larger than left. Patchy densities in the right lower lung. Infection cannot be excluded. Hepatobiliary: Gallbladder appears to be surgically absent. No focal liver abnormality. Common bile duct is prominent measuring 1.1 cm and measured approximately 0.7 cm on 05/28/2023. Pancreas: Unremarkable. No pancreatic ductal dilatation or surrounding inflammatory changes. Spleen: Normal in size without focal abnormality. Adrenals/Urinary Tract: Normal adrenal glands. Normal appearance of the left kidney without stones or hydronephrosis. Again noted is a central right renal mass there is poorly defined on this study without intravascular contrast. Mass roughly measures 5.5 x 4.4 cm. Again noted is a slightly hyperdense exophytic structure in the right kidney lower pole. Foley catheter in the urinary bladder. Stomach/Bowel: Sigmoid colon extends into the upper abdomen and anterior to the distal stomach. Oral contrast within the colon. Moderate sized hiatal hernia with the stomach body posterior to the sigmoid colon and there are small bowel loops along the left side of the distal gastric body. No evidence for bowel dilatation or obstruction. No  focal bowel inflammation. Vascular/Lymphatic: Aortic atherosclerosis. No enlarged abdominal or pelvic lymph nodes. Reproductive: Uterus and bilateral adnexa are unremarkable. Other: Moderate sized hiatal hernia. Spleen may be slightly pulled into the hiatal hernia as well and this is similar to the previous CT examination. No evidence for ascites. Presacral edema. Diffuse subcutaneous edema. Musculoskeletal: Postsurgical changes in the pelvis with surgical plate and screw fixation of the pubic symphysis. Again noted is a surgical screw extending through bilateral SI joints and surgical screw in the left ilium. Markedly displaced fractures of the right fifth, sixth and seventh ribs. Also evidence for acute fractures involving the right eighth and ninth ribs. Patient has evidence of old right rib fractures. New left fractures including a displaced lateral left fifth rib fracture. Old vertebral body compression fractures at L1, T11 and T6. IMPRESSION: 1. Moderate sized hiatal hernia with colon anterior to the distal stomach. 2. Bilateral pleural effusions, right side greater than left. Patchy densities at the right lung base. Findings could be associated with atelectasis or infection. 3. Interval enlargement of common bile duct measuring up to 1.1 cm. Recommend correlation with liver function tests. 4. Right renal mass. This has been present on previous imaging examinations. 5. Subcutaneous edema and presacral edema. Findings may be associated with anasarca. 6. New displaced bilateral rib fractures. Electronically Signed   By: Richarda Overlie M.D.   On: 11/30/2023 10:26   DG Chest Port 1 View Result Date: 11/26/2023 CLINICAL DATA:  [id number] with shortness of breath. EXAM: PORTABLE CHEST 1 VIEW COMPARISON:  Portable chest 11/24/2023 at 4:30 a.m. FINDINGS: 6:07 a.m. ETT/NGT interval removal. Right IJ dialysis catheter tip remains in the upper right atrium. Stable enlargement of the cardiac silhouette. Unchanged mild central  vascular prominence. There is increased streaky atelectasis or infiltrate  in the retrocardiac left lower lobe. There is increased patchy airspace disease in the right upper lobe with unchanged denser consolidation right lower lung field. The left upper lung field remains clear. There are small pleural effusions. Osteopenia. No new osseous findings with right shoulder replacement and overlying skin staples again shown. In all other respects no other changes are seen. IMPRESSION: 1. Increased patchy airspace disease in the right upper lobe with unchanged denser consolidation right lower lung field. 2. Increased streaky atelectasis or infiltrate in the retrocardiac left lower lobe. 3. Small pleural effusions. 4. Stable cardiomegaly and mild central vascular prominence. 5. Interval removal of ETT/NGT. Electronically Signed   By: Almira Bar M.D.   On: 11/26/2023 06:29   DG Chest Port 1 View Result Date: 11/24/2023 CLINICAL DATA:  [id number] with acute hypoxic respiratory failure. EXAM: PORTABLE CHEST 1 VIEW COMPARISON:  Portable chest yesterday at 3:16 p.m. FINDINGS: 4:30 a.m. Left IJ dialysis catheter terminates in the right atrium slightly above the inferior cavoatrial junction. Interval removal pigtail right chest tube. No measurable pneumothorax. ETT tip is 3.3 cm from the carina, NGT tip is in the gastric antrum. Multiple overlying monitor wires. Stable cardiomegaly. There is mild central vascular congestion but with improvement. Mediastinal configuration is stable. There is patchy airspace disease in the right lower lung field with no interval improvement or worsening. Left lung is clear. There is no substantial pleural effusion. No new osseous findings. IMPRESSION: 1. Interval removal of right chest tube. No measurable pneumothorax. 2. Patchy airspace disease in the right lower lung field with no interval improvement or worsening. 3. Cardiomegaly with mild central vascular congestion but with improvement.  Electronically Signed   By: Almira Bar M.D.   On: 11/24/2023 06:38   DG Chest Port 1 View Result Date: 11/23/2023 CLINICAL DATA:  Pneumothorax. EXAM: PORTABLE CHEST 1 VIEW COMPARISON:  Earlier radiograph dated 11/23/2023. FINDINGS: Support lines and tubes in similar position. Interval progression bilateral pulmonary opacities. No large pleural effusion. No pneumothorax. Stable cardiac silhouette. No acute osseous pathology. IMPRESSION: 1. Interval progression of bilateral pulmonary opacities. 2. No pneumothorax. Electronically Signed   By: Elgie Collard M.D.   On: 11/23/2023 16:24   DG Chest Port 1 View Result Date: 11/23/2023 CLINICAL DATA:  73 year old female with history of pneumothorax. EXAM: PORTABLE CHEST 1 VIEW COMPARISON:  Multiple priors, most recently 11/22/2023. FINDINGS: An endotracheal tube is in place with tip 3.6 cm above the carina. Left internal jugular Vas-Cath with tip projecting over the right atrium. Nasogastric tube extends into the antral pre-pyloric region of the stomach. Small bore right-sided chest tube with pigtail reformed over the medial aspect of the right hemithorax. Lung volumes are low. Widespread interstitial prominence and peribronchial cuffing throughout both lungs with some patchy ill-defined opacities throughout the right lung, concerning for bronchitis with right-sided bronchopneumonia. No definite pleural effusions. No appreciable pneumothorax. No evidence of pulmonary edema. Heart size is mildly enlarged. Upper mediastinal contours are within normal limits allowing for patient positioning. Status post right shoulder arthroplasty. IMPRESSION: 1. Support apparatus and postoperative changes, as above. 2. The appearance of the lungs is again most compatible with a background of bronchitis and right-sided multilobar bronchopneumonia. Overall, aeration appears similar to the recent prior study. Electronically Signed   By: Trudie Reed M.D.   On: 11/23/2023 05:48    DG Chest Port 1 View Result Date: 11/22/2023 CLINICAL DATA:  Pneumothorax EXAM: PORTABLE CHEST 1 VIEW COMPARISON:  11/22/2023 FINDINGS: Single frontal view of the chest  demonstrates stable endotracheal tube, enteric catheter, left internal jugular dialysis catheter, and right-sided pigtail drainage catheter. Cardiac silhouette is unremarkable. Patchy bilateral airspace disease is again noted, without significant change since prior exam. Decreased lung volumes, with developing consolidation at the right lung base favoring atelectasis. No evidence of effusion or pneumothorax. Right shoulder arthroplasty. IMPRESSION: 1. Stable support devices. 2. Continued bilateral multifocal bronchopneumonia, with likely developing atelectasis at the right base. 3. No evidence of pneumothorax. Electronically Signed   By: Sharlet Salina M.D.   On: 11/22/2023 17:28   DG Chest Port 1 View Result Date: 11/22/2023 CLINICAL DATA:  73 year old female with history of chest tube. Follow-up study. EXAM: PORTABLE CHEST 1 VIEW COMPARISON:  Chest x-ray 11/21/2023. FINDINGS: An endotracheal tube is in place with tip 2.7 cm above the carina. Left internal jugular Vas-Cath with tip terminating in the right atrium. Small bore right-sided chest tube with pigtail reformed over the medial right hemithorax. A nasogastric tube is seen extending into the stomach, however, the tip of the nasogastric tube extends below the lower margin of the image. Lung volumes are slightly low. Diffuse interstitial prominence, peribronchial cuffing and patchy ill-defined opacities are again noted throughout the lungs bilaterally (right greater than left), overall with slightly improved aeration compared to the prior examination. No pleural effusions. No definite pneumothorax. Heart size appears borderline enlarged. Upper mediastinal contours are within normal limits. Status post right shoulder arthroplasty. IMPRESSION: 1. Support apparatus, as above. 2. Slight  improved aeration in the lungs which may reflect resolving multilobar bilateral bronchopneumonia. Electronically Signed   By: Trudie Reed M.D.   On: 11/22/2023 06:49   DG Chest Port 1 View Result Date: 11/21/2023 CLINICAL DATA:  Pneumothorax EXAM: PORTABLE CHEST 1 VIEW COMPARISON:  11/20/2023 FINDINGS: Indwelling right pigtail chest tube. Suspected tiny right apical pneumothorax. Multifocal patchy opacities in the lungs bilaterally, right lower lobe predominant. No pleural effusion. The heart is top-normal in size. Endotracheal tube terminates 3.1 cm above the carina. Enteric tube courses into the diaphragm. Left IJ dual lumen dialysis catheter terminates in the right atrium. Right shoulder arthroplasty with overlying skin staples. IMPRESSION: Indwelling right pigtail chest tube. Suspected tiny right apical pneumothorax. Multifocal patchy opacities in the lungs bilaterally, right lower lobe predominant. Support apparatus as above. Electronically Signed   By: Charline Bills M.D.   On: 11/21/2023 17:28   DG Abd 1 View Result Date: 11/21/2023 CLINICAL DATA:  Orogastric tube placement. EXAM: ABDOMEN - 1 VIEW COMPARISON:  None Available. FINDINGS: Distal tip of nasogastric tube is seen in expected position of the stomach. IMPRESSION: Distal tip of nasogastric tube is seen in expected position of the stomach. Electronically Signed   By: Lupita Raider M.D.   On: 11/21/2023 10:25   ECHOCARDIOGRAM COMPLETE Result Date: 11/20/2023    ECHOCARDIOGRAM REPORT   Patient Name:   SHUNTELL OSTDIEK Date of Exam: 11/20/2023 Medical Rec #:  [id number]         Height:       63.0 in Accession #:    [id number]        Weight:       186.7 lb Date of Birth:  01/27/1951        BSA:          1.878 m Patient Age:    71 years          BP:           115/65 mmHg Patient Gender: F  HR:           92 bpm. Exam Location:  [facility] Procedure: 2D Echo, Cardiac Doppler and Color Doppler Indications:     Cardiac arrest I46.9   History:         Patient has prior history of Echocardiogram examinations. CHF.  Sonographer:     Neysa Bonito Roar Referring Phys:  [id number] Erin Fulling Diagnosing Phys: Jodelle Red MD IMPRESSIONS  1. Left ventricular ejection fraction, by estimation, is 55 to 60%. The left ventricle has normal function. The left ventricle has no regional wall motion abnormalities. There is mild concentric left ventricular hypertrophy. Left ventricular diastolic parameters are indeterminate.  2. Right ventricular systolic function is normal. The right ventricular size is normal.  3. Left atrial size was moderately dilated.  4. The mitral valve is grossly normal. Trivial mitral valve regurgitation. No evidence of mitral stenosis.  5. The aortic valve is grossly normal. There is mild calcification of the aortic valve. There is mild thickening of the aortic valve. Aortic valve regurgitation is not visualized. Aortic valve sclerosis/calcification is present, without any evidence of aortic stenosis. Comparison(s): No significant change from prior study. Prior images reviewed side by side. FINDINGS  Left Ventricle: Left ventricular ejection fraction, by estimation, is 55 to 60%. The left ventricle has normal function. The left ventricle has no regional wall motion abnormalities. The left ventricular internal cavity size was normal in size. There is  mild concentric left ventricular hypertrophy. Left ventricular diastolic parameters are indeterminate. Right Ventricle: The right ventricular size is normal. No increase in right ventricular wall thickness. Right ventricular systolic function is normal. Left Atrium: Left atrial size was moderately dilated. Right Atrium: Right atrial size was normal in size. Pericardium: There is no evidence of pericardial effusion. Mitral Valve: The mitral valve is grossly normal. Trivial mitral valve regurgitation. No evidence of mitral valve stenosis. MV peak gradient, 12.2 mmHg. The mean mitral valve  gradient is 4.0 mmHg. Tricuspid Valve: The tricuspid valve is grossly normal. Tricuspid valve regurgitation is trivial. No evidence of tricuspid stenosis. Aortic Valve: The aortic valve is grossly normal. There is mild calcification of the aortic valve. There is mild thickening of the aortic valve. Aortic valve regurgitation is not visualized. Aortic valve sclerosis/calcification is present, without any evidence of aortic stenosis. Aortic valve mean gradient measures 5.0 mmHg. Aortic valve peak gradient measures 11.0 mmHg. Aortic valve area, by VTI measures 1.88 cm. Pulmonic Valve: The pulmonic valve was grossly normal. Pulmonic valve regurgitation is trivial. No evidence of pulmonic stenosis. Aorta: The aortic root and ascending aorta are structurally normal, with no evidence of dilitation. Venous: The inferior vena cava was not well visualized. IAS/Shunts: The atrial septum is grossly normal.  LEFT VENTRICLE PLAX 2D LVIDd:         4.20 cm   Diastology LVIDs:         3.10 cm   LV e' medial:    8.81 cm/s LV PW:         1.20 cm   LV E/e' medial:  17.6 LV IVS:        1.20 cm   LV e' lateral:   17.10 cm/s LVOT diam:     1.80 cm   LV E/e' lateral: 9.1 LV SV:         33 LV SV Index:   17 LVOT Area:     2.54 cm  RIGHT VENTRICLE RV Basal diam:  2.90 cm RV Mid diam:    2.80  cm RV S prime:     12.60 cm/s TAPSE (M-mode): 1.6 cm LEFT ATRIUM             Index        RIGHT ATRIUM           Index LA diam:        4.00 cm 2.13 cm/m   RA Area:     10.50 cm LA Vol (A2C):   72.4 ml 38.55 ml/m  RA Volume:   21.20 ml  11.29 ml/m LA Vol (A4C):   65.5 ml 34.88 ml/m LA Biplane Vol: 70.3 ml 37.43 ml/m  AORTIC VALVE                    PULMONIC VALVE AV Area (Vmax):    1.58 cm     PV Vmax:          1.22 m/s AV Area (Vmean):   1.66 cm     PV Peak grad:     6.0 mmHg AV Area (VTI):     1.88 cm     PR End Diast Vel: 9.73 msec AV Vmax:           166.00 cm/s  RVOT Peak grad:   3 mmHg AV Vmean:          95.500 cm/s AV VTI:             0.173 m AV Peak Grad:      11.0 mmHg AV Mean Grad:      5.0 mmHg LVOT Vmax:         103.00 cm/s LVOT Vmean:        62.200 cm/s LVOT VTI:          0.128 m LVOT/AV VTI ratio: 0.74  AORTA Ao Root diam: 2.50 cm Ao Asc diam:  3.10 cm MITRAL VALVE MV Area (PHT): 5.34 cm     SHUNTS MV Area VTI:   0.83 cm     Systemic VTI:  0.13 m MV Peak grad:  12.2 mmHg    Systemic Diam: 1.80 cm MV Mean grad:  4.0 mmHg MV Vmax:       1.75 m/s MV Vmean:      88.3 cm/s MV Decel Time: 142 msec MV E velocity: 155.00 cm/s MV A velocity: 108.00 cm/s MV E/A ratio:  1.44 MV A Prime:    10.4 cm/s Jodelle Red MD Electronically signed by Jodelle Red MD Signature Date/Time: 11/20/2023/5:41:09 PM    Final    DG Chest Port 1 View Result Date: 11/20/2023 CLINICAL DATA:  Pneumothorax.  Intubated.  Chest tube placement. EXAM: PORTABLE CHEST 1 VIEW COMPARISON:  11/19/2023; 08/30/2013 FINDINGS: Unchanged cardiac silhouette and mediastinal contours given patient rotation. Stable positioning of support apparatus. Particularly, stable positioning of right-sided chest tube. No pneumothorax. Redemonstrated heterogeneous airspace opacities within the right mid and upper lung. Left lung remains comparatively well aerated. Pulmonary vasculature remains indistinct. No definite pleural effusion. Postoperative change of the right shoulder with overlying skin staples, incompletely evaluated. IMPRESSION: 1. Stable positioning of support apparatus. Specifically, no pneumothorax. 2. Similar findings of right-sided airspace disease and pulmonary edema. Electronically Signed   By: Simonne Come M.D.   On: 11/20/2023 08:51   US Venous Img Lower Bilateral (DVT) Result Date: 11/20/2023 CLINICAL DATA:  Bilateral lower extremity pain and edema. History of kidney cancer. Evaluate for DVT. EXAM: BILATERAL LOWER EXTREMITY VENOUS DOPPLER ULTRASOUND TECHNIQUE: Gray-scale sonography with graded compression, as well as color Doppler  and duplex ultrasound were  performed to evaluate the lower extremity deep venous systems from the level of the common femoral vein and including the common femoral, femoral, profunda femoral, popliteal and calf veins including the posterior tibial, peroneal and gastrocnemius veins when visible. The superficial great saphenous vein was also interrogated. Spectral Doppler was utilized to evaluate flow at rest and with distal augmentation maneuvers in the common femoral, femoral and popliteal veins. COMPARISON:  None Available. FINDINGS: RIGHT LOWER EXTREMITY Common Femoral Vein: No evidence of thrombus. Normal compressibility, respiratory phasicity and response to augmentation. Saphenofemoral Junction: No evidence of thrombus. Normal compressibility and flow on color Doppler imaging. Profunda Femoral Vein: No evidence of thrombus. Normal compressibility and flow on color Doppler imaging. Femoral Vein: No evidence of thrombus. Normal compressibility, respiratory phasicity and response to augmentation. Popliteal Vein: No evidence of thrombus. Normal compressibility, respiratory phasicity and response to augmentation. Calf Veins: No evidence of thrombus. Normal compressibility and flow on color Doppler imaging. Superficial Great Saphenous Vein: No evidence of thrombus. Normal compressibility. Other Findings:  None. LEFT LOWER EXTREMITY Common Femoral Vein: No evidence of thrombus. Normal compressibility, respiratory phasicity and response to augmentation. Saphenofemoral Junction: No evidence of thrombus. Normal compressibility and flow on color Doppler imaging. Profunda Femoral Vein: No evidence of thrombus. Normal compressibility and flow on color Doppler imaging. Femoral Vein: No evidence of thrombus. Normal compressibility, respiratory phasicity and response to augmentation. Popliteal Vein: No evidence of thrombus. Normal compressibility, respiratory phasicity and response to augmentation. Calf Veins: No evidence of thrombus. Normal  compressibility and flow on color Doppler imaging. Superficial Great Saphenous Vein: No evidence of thrombus. Normal compressibility. Other Findings:  None. IMPRESSION: No evidence of DVT within either lower extremity. Electronically Signed   By: Simonne Come M.D.   On: 11/20/2023 08:49   CT HEAD WO CONTRAST ( ) Result Date: 11/19/2023 CLINICAL DATA:  Head trauma EXAM: CT HEAD WITHOUT CONTRAST TECHNIQUE: Contiguous axial images were obtained from the base of the skull through the vertex without intravenous contrast. RADIATION DOSE REDUCTION: This exam was performed according to the departmental dose-optimization program which includes automated exposure control, adjustment of the mA and/or kV according to patient size and/or use of iterative reconstruction technique. COMPARISON:  None Available. FINDINGS: Brain: No mass,hemorrhage or extra-axial collection. Normal appearance of the parenchyma and CSF spaces. Vascular: No hyperdense vessel or unexpected vascular calcification. Skull: The visualized skull base, calvarium and extracranial soft tissues are normal. Sinuses/Orbits: No fluid levels or advanced mucosal thickening of the visualized paranasal sinuses. No mastoid or middle ear effusion. Normal orbits. IMPRESSION: Normal head CT. Electronically Signed   By: Deatra Robinson M.D.   On: 11/19/2023 21:50   DG Chest Port 1 View Result Date: 11/19/2023 CLINICAL DATA:  [id number] Pneumothorax [id number] EXAM: PORTABLE CHEST 1 VIEW COMPARISON:  Chest x-ray 11/19/2023, CT chest 07/15/2023 FINDINGS: Endotracheal tube with tip 2 cm above the carina. Enteric tube courses below the hemidiaphragm with tip and side port collimated off view. Left internal jugular central venous catheter with tip overlying the right atrium. Interval placement of the right chest tube with pigtail overlying the paramediastinal right hemithorax. The heart and mediastinal contours are unchanged. Interval worsening of bilateral, right greater than  left, patchy airspace and interstitial opacities. No pleural effusion. Interval resolution of right pneumothorax. No pneumothorax bilaterally. Redemonstration of right rib fractures. Partially visualized reverse total shoulder arthroplasty with antibiotic impregnated beads overlying the glenohumeral joint space. Skin staples overlie the right shoulder. IMPRESSION: 1. Interval placement  of the right chest tube with pigtail overlying the paramediastinal right hemithorax with interval resolution of right pneumothorax. 2. Interval worsening of bilateral, right greater than left, patchy airspace and interstitial opacities. 3. Left internal jugular central venous catheter with tip overlying the right atrium. Catheter could be retracted by 3-4 cm. 4. Other lines and tubes as above. 5.  Redemonstration of right rib fractures. Electronically Signed   By: Tish Frederickson M.D.   On: 11/19/2023 16:22   DG Chest Port 1 View Result Date: 11/19/2023 CLINICAL DATA:  Central line placement. EXAM: PORTABLE CHEST 1 VIEW COMPARISON:  08/31/2023.  CT, 07/15/2023. FINDINGS: There is a right-sided pneumothorax, estimated at 30%. No left pneumothorax. Dense opacity is noted throughout the partly collapsed right lung. Interstitial prominence and hazy opacities noted in the left lung. Probable small right effusion. Endotracheal tube tip projects 2.1 cm above the carina. Nasal/orogastric tube passes below the diaphragm well into the stomach. Left internal jugular dual lumen central venous catheter tip lies in the lower superior vena cava. Cardiac silhouette normal in size. Displaced acute appearing fracture of the left lateral fifth rib. Right shoulder arthroplasty projects inferior to the glenoid, possibly dislocated. IMPRESSION: 1. Approximally 30% right-sided pneumothorax. 2. Well-positioned endotracheal tube, left internal jugular central venous line and nasogastric tube. 3. Significant right lung consolidation accentuated by partial  collapse. Consider pneumonia in the proper clinical setting. 4. Acute appearing displaced lateral left fifth rib fracture. Inferior displacement/possible dislocation of the right shoulder prosthesis. Critical Value/emergent results were called by telephone at the time of interpretation on 11/19/2023 at 3:03 pm to the patient's ICU nurse Madilyn Fireman, who verbally acknowledged these results. Electronically Signed   By: Amie Portland M.D.   On: 11/19/2023 15:07   DG Humerus Right Result Date: 11/18/2023 CLINICAL DATA:  Proximal right humeral fracture EXAM: RIGHT HUMERUS - 2+ VIEW COMPARISON:  11/14/2023 CT FLUOROSCOPY TIME:  Radiation Exposure Index (as provided by the fluoroscopic device): Not provided If the device does not provide the exposure index: Fluoroscopy Time:  42 seconds Number of Acquired Images:  2 FINDINGS: Interval revision of the proximal right humeral prosthesis is noted. Antibiotic beads are also noted. Changes of prior proximal humeral fracture are identified. Anatomic alignment of the prosthesis is seen. IMPRESSION: Status post revision of right shoulder arthroplasty Electronically Signed   By: Alcide Clever M.D.   On: 11/18/2023 20:27   DG Shoulder Right Port Result Date: 11/18/2023 CLINICAL DATA:  Right shoulder arthroplasty revision EXAM: RIGHT SHOULDER - 1 VIEW COMPARISON:  10/03/2023 FINDINGS: Internal rotation, external rotation, and transscapular views of the right shoulder are obtained on 4 images. Interval revision of right shoulder arthroplasty, in the expected position without evidence of acute complication. Methylmethacrylate and antibiotic impregnated beads surround the humeral component of the arthroplasty. Surgical drain in the overlying soft tissues. Visualized portions of the right chest are clear. IMPRESSION: 1. Interval revision of right shoulder arthroplasty as above. Anatomic alignment. Electronically Signed   By: Sharlet Salina M.D.   On: 11/18/2023 19:10   DG C-Arm 1-60  Min-No Report Result Date: 11/18/2023 Fluoroscopy was utilized by the requesting physician.  No radiographic interpretation.   DG C-Arm 1-60 Min-No Report Result Date: 11/18/2023 Fluoroscopy was utilized by the requesting physician.  No radiographic interpretation.   [place] OR NERVE BLOCK-IMAGE ONLY [facility]) Result Date: 11/18/2023 There is no interpretation for this exam.  This order is for images obtained during a surgical procedure.  Please See "Surgeries" Tab for more information  regarding the procedure.   CT SHOULDER RIGHT WO CONTRAST Result Date: 11/14/2023 CLINICAL DATA:  Revision of total right shoulder arthroplasty with long stem humeral component and cerclage wires. EXAM: CT OF THE UPPER RIGHT EXTREMITY WITHOUT CONTRAST TECHNIQUE: Multidetector CT imaging of the upper right extremity was performed according to the standard protocol. RADIATION DOSE REDUCTION: This exam was performed according to the departmental dose-optimization program which includes automated exposure control, adjustment of the mA and/or kV according to patient size and/or use of iterative reconstruction technique. COMPARISON:  Radiographs 10/03/2023 FINDINGS: The glenosphere is intact. No complicating features associated with the hardware. New long-stem humeral component with cerclage wires around the upper portion. The right hemithorax bony structures are otherwise intact. No rib fractures. The right lung is grossly clear. Expected hematoma surrounding the humerus in the arm musculature. IMPRESSION: 1. New long-stem humeral component with cerclage wires around the upper portion. 2. No complicating features associated with the new hardware. 3. Healing humerus fractures. 4. Expected hematoma surrounding the humerus in the arm musculature. Electronically Signed   By: Rudie Meyer M.D.   On: 11/14/2023 19:41    Microbiology: No results found for this or any previous visit (from the past 240 hours).  Time spent: 35  minutes  Signed: Marcelino Duster, MD 12/06/2023

## 2023-12-14 NOTE — Plan of Care (Signed)
  Problem: Health Behavior/Discharge Planning: Goal: Ability to manage health-related needs will improve Outcome: Progressing   Problem: Clinical Measurements: Goal: Ability to maintain clinical measurements within normal limits will improve Outcome: Progressing Goal: Will remain free from infection Outcome: Progressing Goal: Diagnostic test results will improve Outcome: Progressing Goal: Respiratory complications will improve Outcome: Progressing Goal: Cardiovascular complication will be avoided Outcome: Progressing   Problem: Activity: Goal: Risk for activity intolerance will decrease Outcome: Progressing   Problem: Nutrition: Goal: Adequate nutrition will be maintained Outcome: Progressing   Problem: Coping: Goal: Level of anxiety will decrease Outcome: Progressing   Problem: Elimination: Goal: Will not experience complications related to bowel motility Outcome: Progressing Goal: Will not experience complications related to urinary retention Outcome: Progressing   Problem: Pain Management: Goal: General experience of comfort will improve Outcome: Progressing   Problem: Safety: Goal: Ability to remain free from injury will improve Outcome: Progressing   Problem: Skin Integrity: Goal: Risk for impaired skin integrity will decrease Outcome: Progressing   Problem: Education: Goal: Knowledge of the prescribed therapeutic regimen will improve Outcome: Progressing Goal: Understanding of activity limitations/precautions following surgery will improve Outcome: Progressing   Problem: Activity: Goal: Ability to tolerate increased activity will improve Outcome: Progressing   Problem: Pain Management: Goal: Pain level will decrease with appropriate interventions Outcome: Progressing   Problem: Education: Goal: Knowledge of the prescribed therapeutic regimen will improve Outcome: Progressing   Problem: Coping: Goal: Ability to identify and develop effective  coping behavior will improve Outcome: Progressing   Problem: Clinical Measurements: Goal: Quality of life will improve Outcome: Progressing   Problem: Respiratory: Goal: Verbalizations of increased ease of respirations will increase Outcome: Progressing   Problem: Role Relationship: Goal: Family's ability to cope with current situation will improve Outcome: Progressing Goal: Ability to verbalize concerns, feelings, and thoughts to partner or family member will improve Outcome: Progressing   Problem: Pain Management: Goal: Satisfaction with pain management regimen will improve Outcome: Progressing

## 2023-12-14 DEATH — deceased

## 2023-12-15 ENCOUNTER — Ambulatory Visit: Payer: Medicare HMO | Admitting: Cardiology

## 2023-12-19 ENCOUNTER — Other Ambulatory Visit: Payer: Medicare HMO

## 2023-12-19 ENCOUNTER — Ambulatory Visit: Payer: Medicare HMO | Admitting: Oncology

## 2024-02-01 ENCOUNTER — Other Ambulatory Visit: Payer: Medicare HMO

## 2024-04-30 ENCOUNTER — Other Ambulatory Visit: Payer: Medicare HMO

## 2024-04-30 ENCOUNTER — Ambulatory Visit: Payer: Medicare HMO | Admitting: Oncology

## 2024-05-15 ENCOUNTER — Ambulatory Visit: Payer: Medicare HMO | Admitting: Urology
